# Patient Record
Sex: Female | Born: 1945
Health system: Southern US, Community
[De-identification: ages and names within clinical notes are randomized; demographics above are authoritative.]

## PROBLEM LIST (undated history)

## (undated) DIAGNOSIS — G629 Polyneuropathy, unspecified: Secondary | ICD-10-CM

## (undated) DIAGNOSIS — K219 Gastro-esophageal reflux disease without esophagitis: Secondary | ICD-10-CM

## (undated) DIAGNOSIS — R600 Localized edema: Secondary | ICD-10-CM

## (undated) DIAGNOSIS — F419 Anxiety disorder, unspecified: Secondary | ICD-10-CM

## (undated) DIAGNOSIS — F329 Major depressive disorder, single episode, unspecified: Secondary | ICD-10-CM

## (undated) DIAGNOSIS — E785 Hyperlipidemia, unspecified: Secondary | ICD-10-CM

## (undated) DIAGNOSIS — F1010 Alcohol abuse, uncomplicated: Secondary | ICD-10-CM

## (undated) DIAGNOSIS — I10 Essential (primary) hypertension: Secondary | ICD-10-CM

## (undated) DIAGNOSIS — K859 Acute pancreatitis without necrosis or infection, unspecified: Secondary | ICD-10-CM

## (undated) DIAGNOSIS — F17200 Nicotine dependence, unspecified, uncomplicated: Secondary | ICD-10-CM

## (undated) DIAGNOSIS — Z8619 Personal history of other infectious and parasitic diseases: Secondary | ICD-10-CM

## (undated) DIAGNOSIS — F32A Depression, unspecified: Secondary | ICD-10-CM

## (undated) HISTORY — DX: Localized edema: R60.0

## (undated) HISTORY — DX: Depression, unspecified: F32.A

## (undated) HISTORY — DX: Personal history of other infectious and parasitic diseases: Z86.19

## (undated) HISTORY — DX: Major depressive disorder, single episode, unspecified: F32.9

## (undated) HISTORY — DX: Anxiety disorder, unspecified: F41.9

## (undated) HISTORY — DX: Polyneuropathy, unspecified: G62.9

## (undated) HISTORY — DX: Hyperlipidemia, unspecified: E78.5

## (undated) HISTORY — DX: Essential (primary) hypertension: I10

---

## 1998-08-14 ENCOUNTER — Ambulatory Visit: Admission: RE | Admit: 1998-08-14 | Discharge: 1998-08-14 | Payer: Self-pay | Admitting: Obstetrics and Gynecology

## 2000-08-16 ENCOUNTER — Emergency Department (HOSPITAL_COMMUNITY): Admission: EM | Admit: 2000-08-16 | Discharge: 2000-08-16 | Payer: Self-pay | Admitting: Emergency Medicine

## 2001-07-26 ENCOUNTER — Encounter: Admission: RE | Admit: 2001-07-26 | Discharge: 2001-07-26 | Payer: Self-pay | Admitting: Obstetrics and Gynecology

## 2001-07-26 ENCOUNTER — Encounter: Payer: Self-pay | Admitting: Obstetrics and Gynecology

## 2002-04-06 ENCOUNTER — Ambulatory Visit (HOSPITAL_COMMUNITY): Admission: RE | Admit: 2002-04-06 | Discharge: 2002-04-06 | Payer: Self-pay | Admitting: *Deleted

## 2002-08-03 ENCOUNTER — Encounter (INDEPENDENT_AMBULATORY_CARE_PROVIDER_SITE_OTHER): Payer: Self-pay | Admitting: Specialist

## 2002-08-03 ENCOUNTER — Ambulatory Visit (HOSPITAL_BASED_OUTPATIENT_CLINIC_OR_DEPARTMENT_OTHER): Admission: RE | Admit: 2002-08-03 | Discharge: 2002-08-03 | Payer: Self-pay | Admitting: Obstetrics and Gynecology

## 2002-12-14 HISTORY — PX: OTHER SURGICAL HISTORY: SHX169

## 2003-01-19 ENCOUNTER — Encounter: Payer: Self-pay | Admitting: Obstetrics and Gynecology

## 2003-01-19 ENCOUNTER — Encounter: Admission: RE | Admit: 2003-01-19 | Discharge: 2003-01-19 | Payer: Self-pay | Admitting: Obstetrics and Gynecology

## 2004-02-25 ENCOUNTER — Encounter: Admission: RE | Admit: 2004-02-25 | Discharge: 2004-02-25 | Payer: Self-pay | Admitting: Obstetrics and Gynecology

## 2005-04-06 ENCOUNTER — Encounter: Admission: RE | Admit: 2005-04-06 | Discharge: 2005-04-06 | Payer: Self-pay | Admitting: Obstetrics and Gynecology

## 2006-06-14 ENCOUNTER — Encounter: Admission: RE | Admit: 2006-06-14 | Discharge: 2006-06-14 | Payer: Self-pay | Admitting: Obstetrics and Gynecology

## 2007-07-05 ENCOUNTER — Encounter: Admission: RE | Admit: 2007-07-05 | Discharge: 2007-07-05 | Payer: Self-pay | Admitting: Obstetrics and Gynecology

## 2008-08-03 ENCOUNTER — Encounter: Admission: RE | Admit: 2008-08-03 | Discharge: 2008-08-03 | Payer: Self-pay | Admitting: Obstetrics and Gynecology

## 2009-02-26 ENCOUNTER — Encounter: Admission: RE | Admit: 2009-02-26 | Discharge: 2009-02-26 | Payer: Self-pay | Admitting: Family Medicine

## 2009-11-25 ENCOUNTER — Encounter: Admission: RE | Admit: 2009-11-25 | Discharge: 2009-11-25 | Payer: Self-pay | Admitting: Obstetrics and Gynecology

## 2011-05-01 NOTE — Op Note (Signed)
[facility]  Patient:    April [name], April [name]                      MRN: [id number] Proc. Date: 08/17/00 Adm. Date:  [id number] Attending:  Donnetta Hutching                           Operative Report  PREOPERATIVE DIAGNOSES: 1. 1.7 cm complex left upper lip laceration secondary to dog bite. 2. 1.6 cm intermediate right upper lip laceration secondary to dog bite. 3. 0.5 cm simple central philtral column laceration secondary to dog bite.  POSTOPERATIVE DIAGNOSES: 1. 1.7 cm complex left upper lip laceration secondary to dog bite. 2. 1.6 cm intermediate right upper lip laceration secondary to dog bite. 3. 0.5 cm simple central philtral column laceration secondary to dog bite.  PROCEDURE: 1. Debridement and repair of 1.7 cm complex left upper lip laceration. 2. Debridement and repair of 1.6 cm intermediate right upper lip laceration. 3. Debridement and repair of 0.5 cm simple central philtral column laceration.   SURGEON:  Mary A. Contogiannis, M.D.  ANESTHESIA:  1% lidocaine with epinephrine.  COMPLICATIONS:  None.  INDICATIONS FOR PROCEDURE:  The patient is a 65 year old Caucasian female who presented to the emergency room after being bit by her pet border collie dog. She noted that she was petting the dog and combing his hair when he accidentally bit her. She has 3 lacerations to the upper lip area which all need to be debrided and repaired. The patient requested that I proceed with that surgery at that time. The initial consultation came later on the evening of August 16, 2000 and it is after midnight and early in the morning of August 17, 2000 when the surgery is completed.  DESCRIPTION OF PROCEDURE:  The patient was lying supine on the stretcher in the emergency room. The patients face was prepped with Betadine and draped in a sterile fashion. The skin and subcutaneous tissues around the incisions were injected with 1% lidocaine with epinephrine.  After adequate hemostasis and anesthesia had taken effect, the procedure was begun. First all the areas of the dog bite were sharply debrided. There were some abrasions and contusion present around them as well as bruising. After the wounds were all debrided sharply, they were irrigated with saline irrigation. First the left upper lift laceration was repaired. This laceration measured 1.7 cm and did cross the vermilion border. It was also in the orbicularis oris musculature. The orbicularis oris musculature was reapproximated using 4-0 monocryl interrupted simple sutures. Next, the subdermal, submucosal sutures were placed using 5-0 monocryl interrupted simple sutures. The skin was then closed using 6-0 Prolene in a running baseball type stitch. This left upper lift laceration had cross the vermilion border. In the course of the repair of the laceration, the vermilion border had first been approximated and then the rest of the laceration. Attention was then turned to the right upper lip laceration. This laceration measured 1.6 cm but did not cross the vermilion border. The deeper subcutaneous tissues were closed with a stitch of 4-0 monocryl interrupted suture. Next, the subdermal layer was closed using 5-0 monocryl interrupted sutures. The skin edges were closed using 6-0 Prolene in a running baseball type stitch. Next, the 0.5 cm central philtral column laceration was repaired. The skin edges were closed using 6-0 Prolene in a running baseball stitch. There were no complications. The patient tolerated the procedure well.  All of the incisions were dressed with bacitracin ointment afterwards. She was given a first dose of the clindamycin and the Cipro to take prior to leaving the ER. As per current recommendations that she is PENICILLIN ALLERGIC, the current recommendations call for clindamycin and Cipro, so these were prescribed for her. The patient was then discharged home with the  following instructions in the care of her husband:  DISCHARGE INSTRUCTIONS: 1. Clean the incision with 1/2 strength hydrogen peroxide as needed t.i.d.    to remove any scabbing or bleeding that occurs. 2. Use Bacitracin ointment on all the incisions 3 times a day. 3. Take clindamycin 300 mg p.o. q.i.d. and Cipro 500 mg p.o. b.i.d. for the    next 7 days. 4. Call my office at [phone] to schedule a follow-up appointment for Friday    as well as if any signs of infection develop or you have any questions. DD: 08/17/00 TD:  08/17/00 Job: [id number]

## 2011-05-01 NOTE — Op Note (Signed)
   April [name], April [name]                       ACCOUNT NO.:  [id number]   MEDICAL RECORD NO.:  [id number]                   PATIENT TYPE:  AMB   LOCATION:  [facility]                                 FACILITY:  [facility]   PHYSICIAN:  Katherine Roan, M.D.               DATE OF BIRTH:  29-May-1946   DATE OF PROCEDURE:  08/03/2002  DATE OF DISCHARGE:                                 OPERATIVE REPORT   PREOPERATIVE DIAGNOSIS:  Menorrhagia.   POSTOPERATIVE DIAGNOSIS:  Menorrhagia.   OPERATION PERFORMED:  Hysteroscopy with resection of endometrial cavity and  small uterine fibroid.   DESCRIPTION OF PROCEDURE:  The patient was placed in lithotomy position,  prepped and draped in the usual fashion. Examination revealed a second  degree cystocele, uterine descensus. Her uterus was slightly enlarged. The  patient was then prepped and draped in the usual fashion, the bladder  emptied. The cervix grasped with a tenaculum and cervix carefully dilated to  a #33 [place]. The hysteroscope was inserted into the uterus and the uterus  was resected with the resectoscope. No unusual blood loss occurred. All the  resected material was sent to the lab for study. She was awakened and  carried to the recovery room in good condition.                                               Katherine Roan, M.D.    SDM/MEDQ  D:  08/03/2002  T:  08/04/2002  Job:  [id number]

## 2011-05-01 NOTE — Consult Note (Signed)
[facility]  Patient:    April [name], April [name]                      MRN: [id number] Proc. Date: 08/16/00 Adm. Date:  [id number] Attending:  Donnetta Hutching                          Consultation Report  HISTORY OF PRESENT ILLNESS:  The patient is a 65 year old Caucasian female who presents to the ER for evaluation of a dog bite to the upper lip.  The patient notes that she has a border collie and that she was grooming the dog when the dog accidentally bit her tonight.  It bit her in the upper lip area; as a result, she has three lacerations there.  At this point, Dr. Donnetta Hutching of the ER staff at [facility] has consulted me for evaluation and repair of the injuries.  The patients tetanus status is not up to date.  PAST MEDICAL HISTORY:  Denies cardiac, lung, liver or kidney disease.  PAST SURGICAL HISTORY:  None.  CURRENT MEDICATIONS:  Hormone-replacement therapy.  ALLERGIES:  PENICILLIN causes hives and a rash.  SOCIAL HISTORY:  The patient works at [employer].  She is married and lives in [place].  PHYSICAL EXAMINATION:  GENERAL:  WD, WN 65 year old Caucasian female in NAD.  There is a slight odor of alcohol to the breath.  HEENT:  [place].  PERRL.  EOMI.  Oropharynx without erythema.  A 1.7-cm complex left upper lip laceration that crosses a vermilion border and into the orbicularis oris musculature.  A 1.6-cm right upper lip laceration that is intermediate and does not cross the vermilion border.  A 0.5-cm central philtral column laceration that is simple.  There is abrasion and contusion as well as bruising present around these lacerations.  IMPRESSION 1. A 1.7-cm complex left upper lip laceration. 2. A 1.6-cm intermediate right upper lip laceration. 3. A 0.5 cm simple philtral column laceration.  All of these lacerations are secondary to a dog bite injury.  At this point, they all need to be debrided, irrigated and repaired.  At the  patients request, the procedure is performed under local anesthesia in the ER without complications.  DISCHARGE INSTRUCTIONS:  She was then discharged home with the following instructions: 1. Clean the incisions with half-strength hydrogen peroxide as needed three    times a day if a scab or crust forms over them. 2. Use Bacitracin ointment on all the incisions three times a day. 3. Take clindamycin 300 mg p.o. q.i.d. and Cipro 500 mg p.o. b.i.d. for the    next seven days since you are penicillin allergic. 4. Call my office at [phone] for a followup appointment to be scheduled on    Friday, as well as if any signs of infection develop or you have any    concerns. DD:  08/17/00 TD:  08/17/00 Job: [id number]

## 2012-02-15 ENCOUNTER — Other Ambulatory Visit: Payer: Self-pay | Admitting: Obstetrics and Gynecology

## 2012-02-15 DIAGNOSIS — Z1231 Encounter for screening mammogram for malignant neoplasm of breast: Secondary | ICD-10-CM

## 2012-03-03 ENCOUNTER — Ambulatory Visit: Payer: Self-pay

## 2015-03-06 ENCOUNTER — Other Ambulatory Visit: Payer: Self-pay | Admitting: Family Medicine

## 2015-03-06 DIAGNOSIS — R600 Localized edema: Secondary | ICD-10-CM

## 2015-03-13 ENCOUNTER — Other Ambulatory Visit: Payer: Self-pay

## 2015-04-05 ENCOUNTER — Ambulatory Visit
Admission: RE | Admit: 2015-04-05 | Discharge: 2015-04-05 | Disposition: A | Discharge status: Home / Self Care | Payer: PPO | Source: Ambulatory Visit | Attending: Family Medicine | Admitting: Family Medicine

## 2015-04-05 DIAGNOSIS — R600 Localized edema: Secondary | ICD-10-CM

## 2015-08-16 ENCOUNTER — Encounter: Payer: Self-pay | Admitting: Vascular Surgery

## 2015-08-16 ENCOUNTER — Other Ambulatory Visit: Payer: Self-pay

## 2015-08-16 DIAGNOSIS — R0989 Other specified symptoms and signs involving the circulatory and respiratory systems: Secondary | ICD-10-CM

## 2015-08-16 DIAGNOSIS — R202 Paresthesia of skin: Principal | ICD-10-CM

## 2015-08-16 DIAGNOSIS — R200 Anesthesia of skin: Secondary | ICD-10-CM

## 2015-09-11 ENCOUNTER — Encounter: Payer: Self-pay | Admitting: Vascular Surgery

## 2015-09-13 ENCOUNTER — Encounter: Payer: Self-pay | Admitting: Vascular Surgery

## 2015-09-13 ENCOUNTER — Ambulatory Visit (INDEPENDENT_AMBULATORY_CARE_PROVIDER_SITE_OTHER): Payer: PPO | Admitting: Vascular Surgery

## 2015-09-13 ENCOUNTER — Ambulatory Visit (HOSPITAL_COMMUNITY)
Admission: RE | Admit: 2015-09-13 | Discharge: 2015-09-13 | Disposition: A | Discharge status: Home / Self Care | Payer: PPO | Source: Ambulatory Visit | Attending: Vascular Surgery | Admitting: Vascular Surgery

## 2015-09-13 VITALS — BP 87/58 | HR 91 | Temp 98.2°F | Resp 18 | Ht 66.0 in | Wt 122.1 lb

## 2015-09-13 DIAGNOSIS — R0989 Other specified symptoms and signs involving the circulatory and respiratory systems: Secondary | ICD-10-CM | POA: Insufficient documentation

## 2015-09-13 DIAGNOSIS — R200 Anesthesia of skin: Secondary | ICD-10-CM

## 2015-09-13 DIAGNOSIS — I10 Essential (primary) hypertension: Secondary | ICD-10-CM | POA: Diagnosis not present

## 2015-09-13 DIAGNOSIS — I872 Venous insufficiency (chronic) (peripheral): Secondary | ICD-10-CM | POA: Diagnosis not present

## 2015-09-13 DIAGNOSIS — E785 Hyperlipidemia, unspecified: Secondary | ICD-10-CM | POA: Insufficient documentation

## 2015-09-13 DIAGNOSIS — R202 Paresthesia of skin: Secondary | ICD-10-CM | POA: Insufficient documentation

## 2015-09-13 NOTE — Progress Notes (Signed)
Referred by:  Lahoma Rocker  Reason for referral: Bilateral ankle numbness   History of Present Illness  April [name] is a 69 y.o. (August 21, 1946) female who presents with chief complaint: bilateral ankle numbness.  Patient unable to determine exactly when it began but she think it may have started when she had an episode of shingles in the right leg during this year.   Patient notes, onset of swelling after she started taking medications for the right leg shingles.  The patient's symptoms include: intermittent paraesthesia and anesthesia in both heels.  No obvious trigger or allievators.  .  The patient has had no history of DVT, no history of pregnancy, known history of varicose vein, no history of venous stasis ulcers, no history of  Lymphedema and known history of skin changes in lower legs.  There is no family history of venous disorders.  The patient has not routinely used compression stockings in the past.   Past Medical History  Diagnosis Date  . Anxiety   . Hypertension   . Leg edema   . Depression   . Hyperlipidemia   . History of shingles summer 2015    Past Surgical History  Procedure Laterality Date  . Removal of uterine fibroids  2004    Social History   Social History  . Marital Status: Married    Spouse Name: N/A  . Number of Children: N/A  . Years of Education: N/A   Occupational History  . Not on file.   Social History Main Topics  . Smoking status: Current Every Day Smoker -- 1.00 packs/day    Types: Cigarettes  . Smokeless tobacco: Not on file  . Alcohol Use: Yes     Comment: 2 glasses of brandy per night  . Drug Use: No  . Sexual Activity: Not on file   Other Topics Concern  . Not on file   Social History Narrative    Family History  Problem Relation Age of Onset  . Heart disease Father     Current Outpatient Prescriptions  Medication Sig Dispense Refill  . amitriptyline (ELAVIL) 25 MG tablet Take 25 mg by mouth at bedtime  as needed.     . butalbital-acetaminophen-caffeine (FIORICET WITH CODEINE) 50-325-40-30 MG per capsule Take 1 capsule by mouth every 4 (four) hours as needed for headache.    . clonazePAM (KLONOPIN) 0.5 MG tablet Take 0.5 mg by mouth 2 (two) times daily as needed for anxiety.    Marland Kitchen buPROPion (WELLBUTRIN XL) 150 MG 24 hr tablet     . butalbital-aspirin-caffeine (FIORINAL) 50-325-40 MG capsule     . escitalopram (LEXAPRO) 10 MG tablet     . escitalopram (LEXAPRO) 20 MG tablet Take 20 mg by mouth daily.    . naltrexone (DEPADE) 50 MG tablet     . Vitamin D, Ergocalciferol, (DRISDOL) 50000 UNITS CAPS capsule      No current facility-administered medications for this visit.    Allergies  Allergen Reactions  . Penicillins      REVIEW OF SYSTEMS:  (Positives checked otherwise negative)  CARDIOVASCULAR:    chest pain,   chest pressure,   palpitations,   shortness of breath when laying flat,   shortness of breath with exertion,    pain in feet when walking,   pain in feet when laying flat,  history of blood clot in veins (DVT),   history of phlebitis,   swelling in legs,    varicose veins  PULMONARY:    productive cough,   asthma,   wheezing  NEUROLOGIC:    weakness in arms or legs,   numbness in arms or legs,   difficulty speaking or slurred speech,   temporary loss of vision in one eye,   dizziness  HEMATOLOGIC:    bleeding problems,   problems with blood clotting too easily  MUSCULOSKEL:    joint pain,  joint swelling  GASTROINTEST:    vomiting blood,   blood in stool     GENITOURINARY:    burning with urination,   blood in urine  PSYCHIATRIC:    history of major depression  INTEGUMENTARY:    rashes,   ulcers  CONSTITUTIONAL:    fever,   chills   Physical Examination  Filed Vitals:   09/13/15 1041  BP: 87/58  Pulse: 91  Temp: 98.2 F (36.8 C)    TempSrc: Oral  Resp: 18  Height:  (1.676 m)  Weight: 122 lb 1.6 oz (55.384 kg)  SpO2: 100%   Body mass index is 19.72 kg/(m^2).  General: A&O x 3, WD, thin  Head: [place]/AT  Ear/Nose/Throat: Hearing grossly intact, nares without erythema or drainage, oropharynx without Erythema/Exudate, Mallampati score: 3  Eyes: PERRLA, EOMI  Neck: Supple, no nuchal rigidity, no palpable LAD  Pulmonary: Sym exp, good air movt, CTAB, no rales, rhonchi, & wheezing  Cardiac: RRR, Nl S1, S2, no Murmurs, rubs or gallops  Vascular: Vessel Right Left  Radial Palpable Palpable  Brachial Palpable Palpable  Carotid Palpable, without bruit Palpable, without bruit  Aorta Not palpable N/A  Femoral Palpable Palpable  Popliteal Not palpable Not palpable  PT Palpable Palpable  DP Palpable Palpable   Gastrointestinal: soft, NTND, no G/R, no HSM, no masses, no CVAT B  Musculoskeletal: M/S 5/5 throughout , Extremities without ischemic changes , mild LDS bilateral, no edema, cyanotic feet which resolve with position change  Neurologic: CN 2-12 intact , Pain and light touch intact in extremities , Motor exam as listed above  Psychiatric: Judgment intact, Mood & affect appropriate for pt's clinical situation   Dermatologic: See M/S exam for extremity exam, no rashes otherwise noted  Lymph : No Cervical, Axillary, or Inguinal lymphadenopathy    Non-Invasive Vascular Imaging  ABI (Date: 09/13/2015)  R:   ABI: 1.22,  DP: tri,   PT: tri,   TBI: 0.56  L:   ABI: 1.18,   DP: tri,   PT: tri,   TBI: 0.74   Outside Studies/Documentation 4 pages of outside documents were reviewed including: outpatient PCP chart.   Medical Decision Making  April [name] is a 69 y.o. female who presents with: BLE chronic venous insufficiency (C2), bilateral heel neuropathic sx, no PAD   Based on the patient's history and examination, I recommend: OTC compressive therapy as needed for any lower  extremity swelling.  She has no evidence of PAD in either leg, so that does not account for her heel sx.  The cyanotic hue in her feet is due to CVI not PAD.  Thank you for allowing [place] to participate in this patient's care.   Leonides Sake, MD [facility] Office: [phone] Pager: [phone]  09/13/2015, 11:10 AM

## 2015-12-05 ENCOUNTER — Ambulatory Visit (INDEPENDENT_AMBULATORY_CARE_PROVIDER_SITE_OTHER): Payer: PPO | Admitting: Neurology

## 2015-12-05 ENCOUNTER — Encounter: Payer: Self-pay | Admitting: Neurology

## 2015-12-05 VITALS — BP 131/68 | HR 87 | Ht 66.0 in | Wt 126.8 lb

## 2015-12-05 DIAGNOSIS — F1010 Alcohol abuse, uncomplicated: Secondary | ICD-10-CM | POA: Diagnosis not present

## 2015-12-05 DIAGNOSIS — G629 Polyneuropathy, unspecified: Secondary | ICD-10-CM

## 2015-12-05 DIAGNOSIS — Z720 Tobacco use: Secondary | ICD-10-CM

## 2015-12-05 DIAGNOSIS — R2689 Other abnormalities of gait and mobility: Secondary | ICD-10-CM

## 2015-12-05 DIAGNOSIS — R269 Unspecified abnormalities of gait and mobility: Secondary | ICD-10-CM | POA: Diagnosis not present

## 2015-12-05 MED ORDER — PREGABALIN 75 MG PO CAPS: 75.0000 mg | ORAL_CAPSULE | Freq: Two times a day (BID) | ORAL | Status: DC

## 2015-12-05 NOTE — Progress Notes (Signed)
[facility]    Provider:  Dr Lucia Gaskins Referring Provider: Richmond Campbell., PA-C Primary Care Physician:  Lilia Argue  CC:  Peripheral neuropathy  HPI:  April [name] is a 69 y.o. female who looks older than stated age here as a referral from Dr. Arlyce [name] for neuropathy. PMHx of chronic venous insufficiency, depression, anxiety, tobacco and alcohol abuse. Numbness in the feet started a year ago. She had shingles and her legs started swelling with the neurontin. She has had swelling in the legs since then. The feel feet tight. The swelling has improved. She has tightness from the ankles to her knees. She has numbness, tingling in the feet. She has problems with balance due to the sensation in the feet. The pain can be 7/10 in pain. Continuous pain. She is not diabetic. She tried Lyrica  twice daily for a week then stopped. Progressively worsening. Denies diabetes. She drinks brandy every day Brandy, at least 2 bottles a week, She smokes a pack of cigarrettes a day. She only treid Lyrica for a week. She denies cramping in her feet. She has weakness in the legs.  She is here with her daughter who also provides information.   Reviewed notes, labs and imaging from outside physicians, which showed:  Notes from [facility]: April [name] is a 69 y.o. (1946-10-18) female who presents with chief complaint: bilateral ankle numbness. Patient unable to determine exactly when it began but she think it may have started when she had an episode of shingles in the right leg during this year. Patient notes, onset of swelling after she started taking medications for the right leg shingles. The patient's symptoms include: intermittent paraesthesia and anesthesia in both heels. No obvious trigger or allievators. . The patient has had no history of DVT, no history of pregnancy, known history of varicose vein, no history of venous stasis ulcers, no history of  Lymphedema and known history of skin changes in lower legs. There is no family history of venous disorders. The patient has not routinely used compression stockings in the past.   RIGHT LOWER EXTREMITY  Common Femoral Vein: No evidence of thrombus. Normal compressibility, respiratory phasicity and response to augmentation.  Saphenofemoral Junction: No evidence of thrombus. Normal compressibility and flow on color Doppler imaging.  Profunda Femoral Vein: No evidence of thrombus. Normal compressibility and flow on color Doppler imaging.  Femoral Vein: No evidence of thrombus. Normal compressibility, respiratory phasicity and response to augmentation.  Popliteal Vein: No evidence of thrombus. Normal compressibility, respiratory phasicity and response to augmentation.  Calf Veins: No evidence of thrombus. Normal compressibility and flow on color Doppler imaging.  Superficial Great Saphenous Vein: No evidence of thrombus. Normal compressibility and flow on color Doppler imaging.  Venous Reflux: None.  Other Findings: None.  LEFT LOWER EXTREMITY  Common Femoral Vein: No evidence of thrombus. Normal compressibility, respiratory phasicity and response to augmentation.  Saphenofemoral Junction: No evidence of thrombus. Normal compressibility and flow on color Doppler imaging.  Profunda Femoral Vein: No evidence of thrombus. Normal compressibility and flow on color Doppler imaging.  Femoral Vein: No evidence of thrombus. Normal compressibility,respiratory phasicity and response to augmentation.  Popliteal Vein: No evidence of thrombus. Normal compressibility,respiratory phasicity and response to augmentation.  Calf Veins: No evidence of thrombus. Normal compressibility and flow on color Doppler imaging.  Superficial Great Saphenous Vein: No evidence of thrombus. Normal compressibility and flow on color Doppler imaging.  Venous Reflux: None.  Other Findings:  None.  IMPRESSION: No evidence of deep venous thrombosis  Review of Systems: Patient complains of symptoms per HPI as well as the following symptoms: swelling in the legs, aching muscles, numbness in feet. Pertinent negatives per HPI. All others negative.   Social History   Social History  . Marital Status: Widowed    Spouse Name: N/A  . Number of Children: 1  . Years of Education: 16   Occupational History  . Not on file.   Social History Main Topics  . Smoking status: Current Every Day Smoker -- 1.00 packs/day    Types: Cigarettes  . Smokeless tobacco: Not on file  . Alcohol Use: Yes     Comment: 2 glasses of brandy per night  . Drug Use: No  . Sexual Activity: Not on file   Other Topics Concern  . Not on file   Social History Narrative   Lives at home with daughter [name]   Caffeine use: Drinks coffee 1/day    Family History  Problem Relation Age of Onset  . Heart disease Father   . Neuropathy Neg Hx     Past Medical History  Diagnosis Date  . Anxiety   . Hypertension   . Leg edema   . Depression   . Hyperlipidemia   . History of shingles summer 2015    Past Surgical History  Procedure Laterality Date  . Removal of uterine fibroids  2004    Current Outpatient Prescriptions  Medication Sig Dispense Refill  . clonazePAM (KLONOPIN) 0.5 MG tablet Take 0.5 mg by mouth 2 (two) times daily as needed for anxiety.    Marland Kitchen. escitalopram (LEXAPRO) 20 MG tablet Take 20 mg by mouth daily.    . naltrexone (DEPADE) 50 MG tablet     . pregabalin (LYRICA) 75 MG capsule Take 1 capsule (75 mg total) by mouth 2 (two) times daily. 60 capsule 6   No current facility-administered medications for this visit.    Allergies as of 12/05/2015 - Review Complete 12/05/2015  Allergen Reaction Noted  . Penicillins  08/16/2015    Vitals: BP 131/68 mmHg  Pulse 87  Ht 5\' 6"  (1.676 m)  Wt 126 lb 12.8 oz (57.516 kg)  BMI 20.48 kg/m2 Last Weight:  Wt Readings from Last 1  Encounters:  12/05/15 126 lb 12.8 oz (57.516 kg)   Last Height:   Ht Readings from Last 1 Encounters:  12/05/15 5\' 6"  (1.676 m)   Physical exam: Exam: Gen: NAD,  thin                   CV: RRR, no MRG. No Carotid Bruits. No peripheral edema, warm, nontender Eyes: Conjunctivae clear without exudates or hemorrhage  Neuro: Detailed Neurologic Exam  Speech:    Speech is normal; fluent and spontaneous with normal comprehension.  Cognition:    The patient is oriented to person, place, and time;     recent and remote memory intact;     language fluent;     normal attention, concentration,     fund of knowledge Cranial Nerves:    The pupils are equal, round, and reactive to light. The fundi are flat.  Visual fields are full to finger confrontation. Extraocular movements are intact. Trigeminal sensation is intact and the muscles of mastication are normal. The face is symmetric. The palate elevates in the midline. Hearing intact. Voice is normal. Shoulder shrug is normal. The tongue has normal motion without fasciculations.   Coordination:  Normal finger to nose and heel to shin.   Gait:   Not ataxic  Motor Observation:    No asymmetry, no atrophy, and no involuntary movements noted. Tone:    Normal muscle tone.    Posture:    Posture is normal. normal erect    Strength:    Strength is V/V in the upper and lower limbs.      Sensation: decr to pp and temp in glove and stocking distribution.      Reflex Exam:  DTR's: Absent AJs. Otherwise deep tendon reflexes in the upper and lower extremities are brisk bilaterally.   Toes:    The toes are downgoing bilaterally.   Clonus:    Clonus is absent.       Assessment/Plan:   69 y.o. female who looks older than stated age here as a referral from Dr. Arlyce [name] for neuropathy. PMHx of chronic venous insufficiency, depression, anxiety, tobacco and alcohol abuse. Will perform a thorough serum neuropathy panel. Suspect distal symmetric  peripheral polyneuropathy from alcohol abuse. She has dec pp and temp in a glove-and-stocking distribution.  Had a long discussion with daughter and mother, both resistant to the idea that alcohol can cause neuropathy and stating that alcohol helps with mother's depression and other problems. Highly encouraged meeting with primary care to make a plan to decrease alcohol use as well as smoking cessation. Will try Lyrica again, explained may take up to 6 weeks to 3 months to see a difference, not to stop after a week. If lyrica doesn't work, can try Gralise or Cymbalta and then refer to pain clinic. Will screen for any other risk factors such as B12 deficiency.   CC: Dr. Glori Bickers, MD  [facility] [address] [place], [place] [address]  Phone [phone] Fax [phone]

## 2015-12-05 NOTE — Patient Instructions (Addendum)
Remember to drink plenty of fluid, eat healthy meals and do not skip any meals. Try to eat protein with a every meal and eat a healthy snack such as fruit or nuts in between meals. Try to keep a regular sleep-wake schedule and try to exercise daily, particularly in the form of walking, 20-30 minutes a day, if you can.   As far as your medications are concerned, I would like to suggest: Lyrica 75mg  twice daily. In 6 weeks if no response and not having any side effects, we can increase to 150mg  twice daily  As far as diagnostic testing: labs  Our phone number is [phone]. We also have an after hours call service for urgent matters and there is a physician on-call for urgent questions. For any emergencies you know to call 911 or go to the nearest emergency room

## 2015-12-09 LAB — MULTIPLE MYELOMA PANEL, SERUM
ALBUMIN/GLOB SERPL: 1.3 (ref 0.7–1.7)
ALPHA2 GLOB SERPL ELPH-MCNC: 0.7 g/dL (ref 0.4–1.0)
Albumin SerPl Elph-Mcnc: 3.3 g/dL (ref 2.9–4.4)
Alpha 1: 0.3 g/dL (ref 0.0–0.4)
B-GLOBULIN SERPL ELPH-MCNC: 1.1 g/dL (ref 0.7–1.3)
GAMMA GLOB SERPL ELPH-MCNC: 0.7 g/dL (ref 0.4–1.8)
GLOBULIN, TOTAL: 2.7 g/dL (ref 2.2–3.9)
IGG (IMMUNOGLOBIN G), SERUM: 575 mg/dL — AB (ref 700–1600)
IgA/Immunoglobulin A, Serum: 275 mg/dL (ref 87–352)
IgM (Immunoglobulin M), Srm: 127 mg/dL (ref 26–217)

## 2015-12-09 LAB — COMPREHENSIVE METABOLIC PANEL
A/G RATIO: 1.9 (ref 1.1–2.5)
ALT: 19 IU/L (ref 0–32)
AST: 49 IU/L — ABNORMAL HIGH (ref 0–40)
Albumin: 3.9 g/dL (ref 3.6–4.8)
Alkaline Phosphatase: 148 IU/L — ABNORMAL HIGH (ref 39–117)
BUN/Creatinine Ratio: 20 (ref 11–26)
BUN: 9 mg/dL (ref 8–27)
Bilirubin Total: 0.3 mg/dL (ref 0.0–1.2)
CALCIUM: 9.6 mg/dL (ref 8.7–10.3)
CO2: 20 mmol/L (ref 18–29)
CREATININE: 0.44 mg/dL — AB (ref 0.57–1.00)
Chloride: 101 mmol/L (ref 96–106)
GFR, EST AFRICAN AMERICAN: 120 mL/min/{1.73_m2} (ref >59)
GFR, EST NON AFRICAN AMERICAN: 104 mL/min/{1.73_m2} (ref >59)
GLOBULIN, TOTAL: 2.1 g/dL (ref 1.5–4.5)
Glucose: 86 mg/dL (ref 65–99)
POTASSIUM: 3.9 mmol/L (ref 3.5–5.2)
SODIUM: 145 mmol/L — AB (ref 134–144)
TOTAL PROTEIN: 6 g/dL (ref 6.0–8.5)

## 2015-12-09 LAB — METHYLMALONIC ACID, SERUM: Methylmalonic Acid: 109 nmol/L (ref 0–378)

## 2015-12-09 LAB — B12 AND FOLATE PANEL: Vitamin B-12: 339 pg/mL (ref 211–946)

## 2015-12-09 LAB — HEAVY METALS, BLOOD
Arsenic: 7 ug/L (ref 2–23)
LEAD, BLOOD: 9 ug/dL (ref 0–19)
Mercury: NOT DETECTED ug/L (ref 0.0–14.9)

## 2015-12-09 LAB — HEMOGLOBIN A1C
Est. average glucose Bld gHb Est-mCnc: 82 mg/dL
Hgb A1c MFr Bld: 4.5 % — ABNORMAL LOW (ref 4.8–5.6)

## 2015-12-09 LAB — VITAMIN B6: VITAMIN B6: 2.3 ug/L (ref 2.0–32.8)

## 2015-12-09 LAB — ANA W/REFLEX: Anti Nuclear Antibody(ANA): NEGATIVE

## 2015-12-09 LAB — B. BURGDORFI ANTIBODIES

## 2015-12-09 LAB — SEDIMENTATION RATE: SED RATE: 22 mm/h (ref 0–40)

## 2015-12-09 LAB — HIV ANTIBODY (ROUTINE TESTING W REFLEX): HIV SCREEN 4TH GENERATION: NONREACTIVE

## 2015-12-09 LAB — RHEUMATOID FACTOR: Rhuematoid fact SerPl-aCnc: 12.9 IU/mL (ref 0.0–13.9)

## 2015-12-09 LAB — HEPATITIS C ANTIBODY: Hep C Virus Ab: <0.1 s/co ratio (ref 0.0–0.9)

## 2015-12-09 LAB — VITAMIN B1: Thiamine: 128.8 nmol/L (ref 66.5–200.0)

## 2015-12-09 LAB — RPR: RPR Ser Ql: NONREACTIVE

## 2015-12-09 LAB — TSH: TSH: 2.35 u[IU]/mL (ref 0.450–4.500)

## 2015-12-11 ENCOUNTER — Encounter: Payer: Self-pay | Admitting: Neurology

## 2015-12-11 DIAGNOSIS — G629 Polyneuropathy, unspecified: Secondary | ICD-10-CM | POA: Insufficient documentation

## 2015-12-11 DIAGNOSIS — F1010 Alcohol abuse, uncomplicated: Secondary | ICD-10-CM | POA: Insufficient documentation

## 2015-12-11 DIAGNOSIS — Z720 Tobacco use: Secondary | ICD-10-CM | POA: Insufficient documentation

## 2015-12-12 ENCOUNTER — Telehealth: Payer: Self-pay | Admitting: *Deleted

## 2015-12-12 NOTE — Telephone Encounter (Signed)
-----   Message from Anson FretAntonia B Ahern, MD sent at 12/08/2015  8:52 AM EST ----- All labs came back normal except her folate. Her folate is very low. She should take a daily vitamin with 1mg  of folate in it. Studies show that a diet high in folate-rich foods can help prevent cancer, heart disease, birth defects, anemia and cognitive decline.

## 2015-12-12 NOTE — Telephone Encounter (Signed)
Spoke to pt about lab results per Dr Lucia GaskinsAhern note. Advised she needs to get daily vitamin w/ 1mg  folate in it. Pt verbalized understanding.

## 2015-12-19 ENCOUNTER — Telehealth: Payer: Self-pay | Admitting: Neurology

## 2015-12-19 DIAGNOSIS — M792 Neuralgia and neuritis, unspecified: Secondary | ICD-10-CM

## 2015-12-19 MED ORDER — PREGABALIN 150 MG PO CAPS: 150.0000 mg | ORAL_CAPSULE | Freq: Two times a day (BID) | ORAL | Status: DC

## 2015-12-19 NOTE — Telephone Encounter (Signed)
Called pt back. Advised lyrica can cause swelling but she states she had this sx prior to starting medication and is not having any SE from lyrica. She has been on medication for 2 weeks. Per Dr Lucia GaskinsAhern note, she would like her to try for 6 weeks and could increase after if not helping to 150mg . She states she is in too much pain and cannot walk well. She is losing her balance. Advised Dr Lucia GaskinsAhern seeing pt right now and will discuss with her and call her back. She verbalized understanding.

## 2015-12-19 NOTE — Telephone Encounter (Signed)
Called pt and relayed per Dr Lucia GaskinsAhern that she is ok to increase lyrica to 150mg  2 times daily. Will send rx to pharmacy. Verified pharmacy. Faxed rx. Received confirmation.

## 2015-12-19 NOTE — Telephone Encounter (Signed)
I am ok with increasing the Lyrica. thnaks

## 2015-12-19 NOTE — Telephone Encounter (Signed)
Patient is calling. She wants to know if there is something she can do for her legs swelling. Her legs are also very painful. Please call and discuss. Thank you.

## 2016-01-01 ENCOUNTER — Telehealth: Payer: Self-pay | Admitting: Neurology

## 2016-01-01 DIAGNOSIS — R2681 Unsteadiness on feet: Secondary | ICD-10-CM | POA: Diagnosis not present

## 2016-01-01 DIAGNOSIS — G629 Polyneuropathy, unspecified: Secondary | ICD-10-CM | POA: Diagnosis not present

## 2016-01-01 DIAGNOSIS — J309 Allergic rhinitis, unspecified: Secondary | ICD-10-CM | POA: Diagnosis not present

## 2016-01-01 NOTE — Telephone Encounter (Signed)
Rn call patient about her receiving physical therapy, and wanted to know Dr.Ahern advice. Rn stated that physical therapy is good for neuropathy.. Rn stated that Dr.Ahern would not mind her having therapy. Message was sent to Dr.Ahern, and she agreed with the therapy.

## 2016-01-01 NOTE — Telephone Encounter (Signed)
Patient called to advise she saw Dr. Karen Chafe at [facility] in [place] and he's recommending Physical Therapy for neuropathy and wants Dr. Lucia Gaskins to know and see if She thinks that's a good idea.

## 2016-01-07 DIAGNOSIS — I8311 Varicose veins of right lower extremity with inflammation: Secondary | ICD-10-CM | POA: Diagnosis not present

## 2016-01-07 DIAGNOSIS — L03115 Cellulitis of right lower limb: Secondary | ICD-10-CM | POA: Diagnosis not present

## 2016-01-07 DIAGNOSIS — I8312 Varicose veins of left lower extremity with inflammation: Secondary | ICD-10-CM | POA: Diagnosis not present

## 2016-01-07 DIAGNOSIS — L03116 Cellulitis of left lower limb: Secondary | ICD-10-CM | POA: Diagnosis not present

## 2016-01-15 DIAGNOSIS — I739 Peripheral vascular disease, unspecified: Secondary | ICD-10-CM | POA: Diagnosis not present

## 2016-01-15 DIAGNOSIS — L03115 Cellulitis of right lower limb: Secondary | ICD-10-CM | POA: Diagnosis not present

## 2016-01-15 DIAGNOSIS — L03116 Cellulitis of left lower limb: Secondary | ICD-10-CM | POA: Diagnosis not present

## 2016-02-24 ENCOUNTER — Encounter (HOSPITAL_COMMUNITY): Payer: Self-pay | Admitting: Vascular Surgery

## 2016-02-24 ENCOUNTER — Emergency Department (HOSPITAL_COMMUNITY): Payer: PPO

## 2016-02-24 ENCOUNTER — Emergency Department (HOSPITAL_COMMUNITY)
Admission: EM | Admit: 2016-02-24 | Discharge: 2016-02-24 | Disposition: A | Discharge status: Home / Self Care | Payer: PPO | Attending: Emergency Medicine | Admitting: Emergency Medicine

## 2016-02-24 DIAGNOSIS — Y9289 Other specified places as the place of occurrence of the external cause: Secondary | ICD-10-CM | POA: Insufficient documentation

## 2016-02-24 DIAGNOSIS — S0181XA Laceration without foreign body of other part of head, initial encounter: Secondary | ICD-10-CM | POA: Insufficient documentation

## 2016-02-24 DIAGNOSIS — Y9389 Activity, other specified: Secondary | ICD-10-CM | POA: Diagnosis not present

## 2016-02-24 DIAGNOSIS — W01198A Fall on same level from slipping, tripping and stumbling with subsequent striking against other object, initial encounter: Secondary | ICD-10-CM | POA: Insufficient documentation

## 2016-02-24 DIAGNOSIS — S0990XA Unspecified injury of head, initial encounter: Secondary | ICD-10-CM | POA: Diagnosis not present

## 2016-02-24 DIAGNOSIS — I10 Essential (primary) hypertension: Secondary | ICD-10-CM | POA: Diagnosis not present

## 2016-02-24 DIAGNOSIS — F17210 Nicotine dependence, cigarettes, uncomplicated: Secondary | ICD-10-CM | POA: Diagnosis not present

## 2016-02-24 DIAGNOSIS — Y998 Other external cause status: Secondary | ICD-10-CM | POA: Diagnosis not present

## 2016-02-24 DIAGNOSIS — R55 Syncope and collapse: Secondary | ICD-10-CM | POA: Diagnosis not present

## 2016-02-24 NOTE — ED Notes (Signed)
Patient states that she is in too much pain to wait any longer.  Patient urged to stay.  Patient requested results of CT.  Patient was advised to call PCP tomorrow for results.

## 2016-02-24 NOTE — ED Notes (Signed)
Pt reports to the ED for eval of headache and right sided head laceration. She fell yesterday and hit her head on the chair and floor. She also hit her nose on the floor and has been having clear nasal drainage. Pt denies any blood thinner use. Pt reports positive LOC. Pt A&Ox4, resp e/u, and skin warm and dry. Pt denies any neck pain.

## 2016-02-26 DIAGNOSIS — Y92099 Unspecified place in other non-institutional residence as the place of occurrence of the external cause: Secondary | ICD-10-CM | POA: Diagnosis not present

## 2016-02-26 DIAGNOSIS — S0181XA Laceration without foreign body of other part of head, initial encounter: Secondary | ICD-10-CM | POA: Diagnosis not present

## 2016-02-26 DIAGNOSIS — S0083XA Contusion of other part of head, initial encounter: Secondary | ICD-10-CM | POA: Diagnosis not present

## 2016-02-26 DIAGNOSIS — W19XXXA Unspecified fall, initial encounter: Secondary | ICD-10-CM | POA: Diagnosis not present

## 2016-03-04 ENCOUNTER — Ambulatory Visit: Payer: PPO | Admitting: Neurology

## 2016-03-04 DIAGNOSIS — M545 Low back pain: Secondary | ICD-10-CM | POA: Diagnosis not present

## 2016-04-16 ENCOUNTER — Ambulatory Visit: Payer: PPO | Admitting: Neurology

## 2016-04-16 ENCOUNTER — Telehealth: Payer: Self-pay | Admitting: *Deleted

## 2016-04-16 NOTE — Telephone Encounter (Signed)
I really cant start any new medications over the phone. Is she sure I can't set her up with Butch PennyMegan Millikan to be seen earlier? She will love Aundra MilletMegan.

## 2016-04-16 NOTE — Telephone Encounter (Signed)
Dr Lucia GaskinsAhern- Please advise. Annabelle HarmanDana did suggest seeing NP same day but pt changed her mind and wanted to still f/u with you. Please advise. She had to r/s f/u because she showed up late.

## 2016-04-17 NOTE — Telephone Encounter (Signed)
Aundra MilletMegan, she is scheduled with you on the 8th. Let;s discuss, thanks

## 2016-04-17 NOTE — Telephone Encounter (Signed)
Called pt. R/s for earlier f/u. Patient had to r/s appt from 5/4 d/t being late. I went over late policy with pt. She knows to check in 1045am. Advised April MilletMegan is one of our NP and is great. Dr [facility]Ahern [facility] while she is in the office for appt. She verbalized understanding and appreciation.

## 2016-04-20 ENCOUNTER — Encounter: Payer: Self-pay | Admitting: Adult Health

## 2016-04-20 ENCOUNTER — Ambulatory Visit (INDEPENDENT_AMBULATORY_CARE_PROVIDER_SITE_OTHER): Payer: PPO | Admitting: Adult Health

## 2016-04-20 ENCOUNTER — Telehealth: Payer: Self-pay | Admitting: Adult Health

## 2016-04-20 ENCOUNTER — Telehealth: Payer: Self-pay | Admitting: *Deleted

## 2016-04-20 VITALS — BP 112/73 | HR 88 | Ht 67.0 in | Wt 116.2 lb

## 2016-04-20 DIAGNOSIS — G629 Polyneuropathy, unspecified: Secondary | ICD-10-CM | POA: Diagnosis not present

## 2016-04-20 DIAGNOSIS — R269 Unspecified abnormalities of gait and mobility: Secondary | ICD-10-CM

## 2016-04-20 DIAGNOSIS — M792 Neuralgia and neuritis, unspecified: Secondary | ICD-10-CM | POA: Diagnosis not present

## 2016-04-20 MED ORDER — PREGABALIN 150 MG PO CAPS: 150.0000 mg | ORAL_CAPSULE | Freq: Two times a day (BID) | ORAL | Status: DC

## 2016-04-20 NOTE — Addendum Note (Signed)
Addended by: Enedina FinnerMILLIKAN, Jenesis Martin P on: 04/20/2016 05:03 PM   Modules accepted: Orders

## 2016-04-20 NOTE — Progress Notes (Addendum)
PATIENT: April [name] DOB: 1946/07/07  REASON FOR VISIT: follow up- neuropathy HISTORY FROM: patient  HISTORY OF PRESENT ILLNESS: April [name] is a 70 year old female with a history of neuropathy. She returns today for follow-up. She reports that she continues to have discomfort in the lower extremities. However when she describes her discomfort she describes it as numbness. She denies any sharp shooting pains. Denies any burning or tingling. She states that she does have difficulty ambulating due to her feet being numb. She reports that she has been on Lyrica however she was not taking it as prescribed. She states that she was taking at least one dose daily sometimes she would skip that. She has been off of Lyrica for 1 week but have not noticed any significant changes with her discomfort. She has shingles that affected the right leg. She states occasionally she'll get sharp shooting pains around the hip area. However she has primarily noticed this since she has not been taking Lyrica. She does not use an assistive device when ambulating. She reports that she had one fall that she relates to her blood pressure. Her daughter is with her today. She is requesting narcotic pain medication for her mother. She reports that her mom's been on tramadol with no benefit. She also reports that she put a lidocaine patch on her mom's back and that helped with her discomfort.  Initial visit 12/05/2015 [facility]): April [name] is a 70 y.o. female who looks older than stated age here as a referral from Dr. Arlyce Dice for neuropathy. PMHx of chronic venous insufficiency, depression, anxiety, tobacco and alcohol abuse. Numbness in the feet started a year ago. She had shingles and her legs started swelling with the neurontin. She has had swelling in the legs since then. The feel feet tight. The swelling has improved. She has tightness from the ankles to her knees. She has numbness, tingling in the feet. She has problems  with balance due to the sensation in the feet. The pain can be 7/10 in pain. Continuous pain. She is not diabetic. She tried Lyrica 150mg  twice daily for a week then stopped. Progressively worsening. Denies diabetes. She drinks brandy every day Brandy, at least 2 bottles a week, She smokes a pack of cigarrettes a day. She only treid Lyrica for a week. She denies cramping in her feet. She has weakness in the legs. She is here with her daughter who also provides information.   Reviewed notes, labs and imaging from outside physicians, which showed:  Notes from [facility]: April [name] is a 70 y.o. (05/14/1946) female who presents with chief complaint: bilateral ankle numbness. Patient unable to determine exactly when it began but she think it may have started when she had an episode of shingles in the right leg during this year. Patient notes, onset of swelling after she started taking medications for the right leg shingles. The patient's symptoms include: intermittent paraesthesia and anesthesia in both heels. No obvious trigger or allievators. . The patient has had no history of DVT, no history of pregnancy, known history of varicose vein, no history of venous stasis ulcers, no history of Lymphedema and known history of skin changes in lower legs. There is no family history of venous disorders. The patient has not routinely used compression stockings in the past.   RIGHT LOWER EXTREMITY  Common Femoral Vein: No evidence of thrombus. Normal compressibility, respiratory phasicity and response to augmentation.  Saphenofemoral Junction: No evidence of thrombus.  Normal compressibility and flow on color Doppler imaging.  Profunda Femoral Vein: No evidence of thrombus. Normal compressibility and flow on color Doppler imaging.  Femoral Vein: No evidence of thrombus. Normal compressibility, respiratory phasicity and response to augmentation.  Popliteal Vein: No evidence  of thrombus. Normal compressibility, respiratory phasicity and response to augmentation.  Calf Veins: No evidence of thrombus. Normal compressibility and flow on color Doppler imaging.  Superficial Great Saphenous Vein: No evidence of thrombus. Normal compressibility and flow on color Doppler imaging.  Venous Reflux: None.  Other Findings: None.  LEFT LOWER EXTREMITY  Common Femoral Vein: No evidence of thrombus. Normal compressibility, respiratory phasicity and response to augmentation.  Saphenofemoral Junction: No evidence of thrombus. Normal compressibility and flow on color Doppler imaging.  Profunda Femoral Vein: No evidence of thrombus. Normal compressibility and flow on color Doppler imaging.  Femoral Vein: No evidence of thrombus. Normal compressibility,respiratory phasicity and response to augmentation.  Popliteal Vein: No evidence of thrombus. Normal compressibility,respiratory phasicity and response to augmentation.  Calf Veins: No evidence of thrombus. Normal compressibility and flow on color Doppler imaging.  Superficial Great Saphenous Vein: No evidence of thrombus. Normal compressibility and flow on color Doppler imaging.  Venous Reflux: None.  Other Findings: None.  IMPRESSION: No evidence of deep venous thrombosis  REVIEW OF SYSTEMS: Out of a complete 14 system review of symptoms, the patient complains only of the following symptoms, and all other reviewed systems are negative.  Activity change, appetite change, leg swelling, back pain, walking difficulty  ALLERGIES: Allergies  Allergen Reactions  . Naltrexone Shortness Of Breath  . Penicillins Rash    HOME MEDICATIONS: Outpatient Prescriptions Prior to Visit  Medication Sig Dispense Refill  . clonazePAM (KLONOPIN) 0.5 MG tablet Take 0.5 mg by mouth 2 (two) times daily as needed for anxiety.    Marland Kitchen escitalopram (LEXAPRO) 20 MG tablet Take 20 mg by mouth daily.    . pregabalin (LYRICA)  150 MG capsule Take 1 capsule (150 mg total) by mouth 2 (two) times daily. 60 capsule 5  . naltrexone (DEPADE) 50 MG tablet      No facility-administered medications prior to visit.    PAST MEDICAL HISTORY: Past Medical History  Diagnosis Date  . Anxiety   . Hypertension   . Leg edema   . Depression   . Hyperlipidemia   . History of shingles summer 2015    PAST SURGICAL HISTORY: Past Surgical History  Procedure Laterality Date  . Removal of uterine fibroids  2004    FAMILY HISTORY: Family History  Problem Relation Age of Onset  . Heart disease Father   . Neuropathy Neg Hx     SOCIAL HISTORY: Social History   Social History  . Marital Status: Widowed    Spouse Name: N/A  . Number of Children: 1  . Years of Education: 16   Occupational History  . Not on file.   Social History Main Topics  . Smoking status: Current Every Day Smoker -- 1.00 packs/day    Types: Cigarettes  . Smokeless tobacco: Not on file  . Alcohol Use: Yes     Comment: 2 glasses of brandy per night  . Drug Use: No  . Sexual Activity: Not on file   Other Topics Concern  . Not on file   Social History Narrative   Lives at home with daughter Yvonne Kendall   Caffeine use: Drinks coffee 1/day      PHYSICAL EXAM  Filed Vitals:   04/20/16 1105  BP: 112/73  Pulse: 88  Height: 5\' 7"  (1.702 m)  Weight: 116 lb 3.2 oz (52.708 kg)   Body mass index is 18.2 kg/(m^2).  Generalized: Well developed, in no acute distress  Skin: Lower extremities starting mid calf to the foot is red with mild edema.  Neurological examination  Mentation: Alert oriented to time, place, history taking. Follows all commands speech and language fluent Cranial nerve II-XII: Pupils were equal round reactive to light. Extraocular movements were full, visual field were full on confrontational test. Facial sensation and strength were normal. Uvula tongue midline. Head turning and shoulder shrug  were normal and symmetric. Motor:  The motor testing reveals 5 over 5 strength of all 4 extremities. Good symmetric motor tone is noted throughout.  Sensory: Sensory testing is intact to soft touch on all 4 extremities.Pinprick sensation is decreased in the lower extremities in a stocking-like pattern. Vibration sensation decreased in the lower extremities. No evidence of extinction is noted.  Coordination: Cerebellar testing reveals good finger-nose-finger and heel-to-shin bilaterally.  Gait and station: Gait is wide-based. Tandem gait is unsteady. Romberg is negative. No drift is seen.  Reflexes: Deep tendon reflexes are symmetric and normal bilaterally.   DIAGNOSTIC DATA (LABS, IMAGING, TESTING) - I reviewed patient records, labs, notes, testing and imaging myself where available.      Component Value Date/Time   NA 145* 12/05/2015 1059   K 3.9 12/05/2015 1059   CL 101 12/05/2015 1059   CO2 20 12/05/2015 1059   GLUCOSE 86 12/05/2015 1059   BUN 9 12/05/2015 1059   CREATININE 0.44* 12/05/2015 1059   CALCIUM 9.6 12/05/2015 1059   PROT 6.0 12/05/2015 1059   ALBUMIN 3.9 12/05/2015 1059   AST 49* 12/05/2015 1059   ALT 19 12/05/2015 1059   ALKPHOS 148* 12/05/2015 1059   BILITOT 0.3 12/05/2015 1059   GFRNONAA 104 12/05/2015 1059   GFRAA 120 12/05/2015 1059    Lab Results  Component Value Date   HGBA1C 4.5* 12/05/2015   Lab Results  Component Value Date   VITAMINB12 339 12/05/2015   Lab Results  Component Value Date   TSH 2.350 12/05/2015      ASSESSMENT AND PLAN 70 y.o. year old female  has a past medical history of Anxiety; Hypertension; Leg edema; Depression; Hyperlipidemia; and History of shingles (summer 2015). here with:  1. Neuropathy 2. Abnormality of gait  I had a long discussion with the patient and her daughter.Alcohol could be a contributing factor to her neuropathy. I advised that she should slowly decrease her alcohol intake. Patient verbalized understanding. Also suggested that we could do a  nerve conduction studies with EMG however the patient refused. The patient will restart Lyrica 150 mg twice a day. The patient's daughter is asking about narcotic pain medication I advised that we could provide the patient with tramadol however we do not prescribe narcotics. The patient however refuse tramadol. I will refer the patient for physical therapy for gait and balance training. She is amenable to this.. Patient advised that if her symptoms worsen or she develops any new symptoms she should let us know. She will follow-up in 3-4 months with Dr. Lucia Gaskins  I spent 25 minutes with the patient 50% of this time was spent counseling the patient on her diagnosis and treatment.    Butch Penny, MSN, NP-C 04/20/2016, 12:00 PM [facility] [address] [place], [place] [address] [phone]    Personally participated in and made any corrections  needed to history, physical, neuro exam,assessment and plan as stated above, evaluated lab date, reviewed imaging studies and agree with radiology interpretations. At next appointment, patient's daughter should be asked to wait outside so we can see patient alone. She asks for pain medications fo rmother, she encourages her alcohol use ("but helps her mood") and it is difficult to talk to patient as daughter answers all questions thanks.  Naomie DeanAntonia Ahern, MD [facility]

## 2016-04-20 NOTE — Telephone Encounter (Signed)
Called and spoke to RavennaRochelle. Advised pt was seen in office today. New rx called in for different dose of lyrica. 150mg  instead of 75mg  2x/day. Kim picked up call. She stated rx lyrica150mg  2x/day already sent on 12/19/15 by Dr Lucia GaskinsAhern. She is trying to fill it 2 days too early. I advised I will let Tylene FantasiaMegan M, NP know and call back if any changes need to be made. She verbalized understanding.

## 2016-04-20 NOTE — Telephone Encounter (Signed)
Prescription was refilled. However I am inquiring with the pharmacy how often her prescription has been picked up since the dose was changed in January. The pharmacist will call me back with this information.

## 2016-04-20 NOTE — Telephone Encounter (Signed)
Pt's daughter called said she took hard copy of pregabalin (LYRICA) 150 MG capsule to [facility] [id number].  She sts they will be calling to fill this script early or daughter is asking if clinic can call [facility] . Please call daughter when this has been approved.

## 2016-04-20 NOTE — Telephone Encounter (Signed)
I called and gave the verbal order to ok refill lyrica prescription early.  Spoke to pharmacist.

## 2016-04-20 NOTE — Patient Instructions (Signed)
Continue Lyrica 150 mg twice a day Physical therapy  If your symptoms worsen or you develop new symptoms please let us know.

## 2016-04-20 NOTE — Telephone Encounter (Signed)
Spoke to LonerockBobbi, daughter and let her know that the prescription was ok'd for early refill. She would let pt know.

## 2016-04-20 NOTE — Telephone Encounter (Signed)
Kim from [facility] calling stating pt is there requesting early refill on  pregabalin (LYRICA) 150 MG capsule. While typing message call was dropped.

## 2016-04-21 NOTE — Telephone Encounter (Signed)
I spoke to the pharmacy. The patient had her prescription refilled at the beginning of March and again at the end of March. However she did not pick her prescription up till April 13th. Therefore when she presented with a refill request-she was actually 2 days early. During the office visit the patient stated that she was not taking her medication as prescribed. Therefore she should have medication left over? I called the patient to relay this information. She is unsurewhy she does not have medication left over. She again states that she has NOT been taking 2 tablets a day. She states more often she only takes 1 tablet if that. I will refill for now. However at the next office visit we may need to consider testing pregabalin level?

## 2016-04-28 DIAGNOSIS — W57XXXA Bitten or stung by nonvenomous insect and other nonvenomous arthropods, initial encounter: Secondary | ICD-10-CM | POA: Diagnosis not present

## 2016-04-28 DIAGNOSIS — R5383 Other fatigue: Secondary | ICD-10-CM | POA: Diagnosis not present

## 2016-04-29 ENCOUNTER — Ambulatory Visit: Payer: PPO | Admitting: Neurology

## 2016-05-15 DIAGNOSIS — R197 Diarrhea, unspecified: Secondary | ICD-10-CM | POA: Diagnosis not present

## 2016-05-15 DIAGNOSIS — K529 Noninfective gastroenteritis and colitis, unspecified: Secondary | ICD-10-CM | POA: Diagnosis not present

## 2016-05-16 ENCOUNTER — Emergency Department (HOSPITAL_COMMUNITY)
Admission: EM | Admit: 2016-05-16 | Discharge: 2016-05-16 | Disposition: A | Discharge status: Home / Self Care | Payer: PPO | Attending: Emergency Medicine | Admitting: Emergency Medicine

## 2016-05-16 ENCOUNTER — Encounter (HOSPITAL_COMMUNITY): Payer: Self-pay | Admitting: Nurse Practitioner

## 2016-05-16 DIAGNOSIS — R1031 Right lower quadrant pain: Secondary | ICD-10-CM | POA: Diagnosis not present

## 2016-05-16 DIAGNOSIS — K529 Noninfective gastroenteritis and colitis, unspecified: Secondary | ICD-10-CM | POA: Insufficient documentation

## 2016-05-16 DIAGNOSIS — F17210 Nicotine dependence, cigarettes, uncomplicated: Secondary | ICD-10-CM | POA: Diagnosis not present

## 2016-05-16 DIAGNOSIS — E876 Hypokalemia: Secondary | ICD-10-CM | POA: Insufficient documentation

## 2016-05-16 DIAGNOSIS — Z880 Allergy status to penicillin: Secondary | ICD-10-CM | POA: Diagnosis not present

## 2016-05-16 DIAGNOSIS — Z8619 Personal history of other infectious and parasitic diseases: Secondary | ICD-10-CM | POA: Insufficient documentation

## 2016-05-16 DIAGNOSIS — E860 Dehydration: Secondary | ICD-10-CM | POA: Insufficient documentation

## 2016-05-16 DIAGNOSIS — I10 Essential (primary) hypertension: Secondary | ICD-10-CM | POA: Diagnosis not present

## 2016-05-16 DIAGNOSIS — R197 Diarrhea, unspecified: Secondary | ICD-10-CM

## 2016-05-16 DIAGNOSIS — R1030 Lower abdominal pain, unspecified: Secondary | ICD-10-CM

## 2016-05-16 DIAGNOSIS — F329 Major depressive disorder, single episode, unspecified: Secondary | ICD-10-CM | POA: Insufficient documentation

## 2016-05-16 DIAGNOSIS — R110 Nausea: Secondary | ICD-10-CM | POA: Diagnosis not present

## 2016-05-16 DIAGNOSIS — F419 Anxiety disorder, unspecified: Secondary | ICD-10-CM | POA: Insufficient documentation

## 2016-05-16 DIAGNOSIS — Z79899 Other long term (current) drug therapy: Secondary | ICD-10-CM | POA: Insufficient documentation

## 2016-05-16 LAB — COMPREHENSIVE METABOLIC PANEL
ALBUMIN: 3.4 g/dL — AB (ref 3.5–5.0)
ALK PHOS: 100 U/L (ref 38–126)
ALT: 11 U/L — ABNORMAL LOW (ref 14–54)
ANION GAP: 10 (ref 5–15)
AST: 28 U/L (ref 15–41)
BUN: 11 mg/dL (ref 6–20)
CO2: 24 mmol/L (ref 22–32)
Calcium: 8.8 mg/dL — ABNORMAL LOW (ref 8.9–10.3)
Chloride: 101 mmol/L (ref 101–111)
Creatinine, Ser: 0.64 mg/dL (ref 0.44–1.00)
GFR calc Af Amer: >60 mL/min (ref >60)
GFR calc non Af Amer: >60 mL/min (ref >60)
GLUCOSE: 78 mg/dL (ref 65–99)
POTASSIUM: 2.4 mmol/L — AB (ref 3.5–5.1)
SODIUM: 135 mmol/L (ref 135–145)
Total Bilirubin: 0.1 mg/dL — ABNORMAL LOW (ref 0.3–1.2)
Total Protein: 6.3 g/dL — ABNORMAL LOW (ref 6.5–8.1)

## 2016-05-16 LAB — CBC WITH DIFFERENTIAL/PLATELET
BASOS ABS: 0 10*3/uL (ref 0.0–0.1)
Basophils Relative: 0 %
Eosinophils Absolute: 0.1 10*3/uL (ref 0.0–0.7)
Eosinophils Relative: 1 %
HEMATOCRIT: 42.1 % (ref 36.0–46.0)
Hemoglobin: 14.5 g/dL (ref 12.0–15.0)
LYMPHS ABS: 2.1 10*3/uL (ref 0.7–4.0)
LYMPHS PCT: 36 %
MCH: 37.8 pg — ABNORMAL HIGH (ref 26.0–34.0)
MCHC: 34.4 g/dL (ref 30.0–36.0)
MCV: 109.6 fL — AB (ref 78.0–100.0)
MONO ABS: 0.7 10*3/uL (ref 0.1–1.0)
MONOS PCT: 11 %
NEUTROS ABS: 3 10*3/uL (ref 1.7–7.7)
Neutrophils Relative %: 52 %
Platelets: 273 10*3/uL (ref 150–400)
RBC: 3.84 MIL/uL — ABNORMAL LOW (ref 3.87–5.11)
RDW: 14.5 % (ref 11.5–15.5)
WBC: 5.8 10*3/uL (ref 4.0–10.5)

## 2016-05-16 LAB — URINALYSIS, ROUTINE W REFLEX MICROSCOPIC
Bilirubin Urine: NEGATIVE
GLUCOSE, UA: NEGATIVE mg/dL
Hgb urine dipstick: NEGATIVE
Ketones, ur: NEGATIVE mg/dL
LEUKOCYTES UA: NEGATIVE
Nitrite: NEGATIVE
PH: 6 (ref 5.0–8.0)
Protein, ur: NEGATIVE mg/dL
SPECIFIC GRAVITY, URINE: 1.008 (ref 1.005–1.030)

## 2016-05-16 MED ORDER — PROMETHAZINE HCL 25 MG/ML IJ SOLN
12.5000 mg | Freq: Once | INTRAMUSCULAR | Status: AC
  Administered 2016-05-16: 12.5 mg via INTRAVENOUS
  Filled 2016-05-16 (×2): qty 1

## 2016-05-16 MED ORDER — NICOTINE 7 MG/24HR TD PT24: 7.0000 mg | MEDICATED_PATCH | Freq: Once | TRANSDERMAL | Status: DC

## 2016-05-16 MED ORDER — NICOTINE 21 MG/24HR TD PT24
21.0000 mg | MEDICATED_PATCH | Freq: Once | TRANSDERMAL | Status: DC
  Administered 2016-05-16: 21 mg via TRANSDERMAL

## 2016-05-16 MED ORDER — POTASSIUM CHLORIDE CRYS ER 20 MEQ PO TBCR: 20.0000 meq | EXTENDED_RELEASE_TABLET | Freq: Every day | ORAL | Status: DC

## 2016-05-16 MED ORDER — ONDANSETRON 4 MG PO TBDP: 4.0000 mg | ORAL_TABLET | Freq: Three times a day (TID) | ORAL | Status: DC | PRN

## 2016-05-16 MED ORDER — PROMETHAZINE HCL 12.5 MG PO TABS: 12.5000 mg | ORAL_TABLET | Freq: Four times a day (QID) | ORAL | Status: DC | PRN

## 2016-05-16 MED ORDER — POTASSIUM CHLORIDE CRYS ER 20 MEQ PO TBCR
80.0000 meq | EXTENDED_RELEASE_TABLET | Freq: Once | ORAL | Status: AC
  Administered 2016-05-16: 80 meq via ORAL
  Filled 2016-05-16: qty 4

## 2016-05-16 MED ORDER — ONDANSETRON HCL 4 MG/2ML IJ SOLN
4.0000 mg | Freq: Once | INTRAMUSCULAR | Status: AC
  Administered 2016-05-16: 4 mg via INTRAVENOUS
  Filled 2016-05-16: qty 2

## 2016-05-16 MED ORDER — SODIUM CHLORIDE 0.9 % IV BOLUS (SEPSIS)
1000.0000 mL | Freq: Once | INTRAVENOUS | Status: AC
  Administered 2016-05-16: 1000 mL via INTRAVENOUS

## 2016-05-16 NOTE — ED Notes (Signed)
Pt verbalized understanding of d/c instructions and has no further questions. Pt stable and NAD. Pt d/c home with family driving. Pt educated on importance of proper fluid intake while having diarrhea.

## 2016-05-16 NOTE — ED Notes (Signed)
Pt requested nicotine patch and Mercedes gave verbal order for nicotine patch.

## 2016-05-16 NOTE — ED Notes (Signed)
CRITICAL VALUE ALERT  Critical value received:  Potassium 2.4  Date of notification:  05/16/2016  Time of notification:  1523  Critical value read back:Yes.    Nurse who received alert:  MG Jessicah Croll,RN  MD notified (1st page): Dr. Adela LankFloyd  Time of first page:  1523  MD notified (2nd page):  Time of second page:  Responding MD:  Dr. Adela LankFloyd  Time MD responded:  [phone]

## 2016-05-16 NOTE — ED Notes (Signed)
Family at bedside. 

## 2016-05-16 NOTE — Discharge Instructions (Signed)
Use zofran as prescribed, as needed for nausea/vomiting. Use phenergan as directed as needed for persistent nausea/vomiting if zofran isn't helping. Take the potassium tablets as directed. Use tylenol or motrin as needed for pain. Stay well hydrated with plenty of fluids like water and gatorade throughout the day. Take your home imodium and/or bentyl as directed by your primary care doctor. Follow a BRAT (banana-rice-applesauce-toast) diet as described below for the next 24-48 hours. The 'BRAT' diet is suggested, then progress to diet as tolerated as symptoms abate. Call your regular doctor if bloody stools, persistent diarrhea, vomiting, fever or abdominal pain. Follow up with your regular doctor in 3 days for recheck of symptoms and repeat lab work. Return to ER for changing or worsening of symptoms.  Food Choices to Help Relieve Diarrhea When you have diarrhea, the foods you eat and your eating habits are very important. Choosing the right foods and drinks can help relieve diarrhea. Also, because diarrhea can last up to 7 days, you need to replace lost fluids and electrolytes (such as sodium, potassium, and chloride) in order to help prevent dehydration.  WHAT GENERAL GUIDELINES DO I NEED TO FOLLOW?  Slowly drink 1 cup (8 oz) of fluid for each episode of diarrhea. If you are getting enough fluid, your urine will be clear or pale yellow.  Eat starchy foods. Some good choices include white rice, white toast, pasta, low-fiber cereal, baked potatoes (without the skin), saltine crackers, and bagels.  Avoid large servings of any cooked vegetables.  Limit fruit to two servings per day. A serving is  cup or 1 small piece.  Choose foods with less than 2 g of fiber per serving.  Limit fats to less than 8 tsp (38 g) per day.  Avoid fried foods.  Eat foods that have probiotics in them. Probiotics can be found in certain dairy products.  Avoid foods and beverages that may increase the speed at which  food moves through the stomach and intestines (gastrointestinal tract). Things to avoid include:  High-fiber foods, such as dried fruit, raw fruits and vegetables, nuts, seeds, and whole grain foods.  Spicy foods and high-fat foods.  Foods and beverages sweetened with high-fructose corn syrup, honey, or sugar alcohols such as xylitol, sorbitol, and mannitol. WHAT FOODS ARE RECOMMENDED? Grains White rice. White, [place], or pita breads (fresh or toasted), including plain rolls, buns, or bagels. White pasta. Saltine, soda, or graham crackers. Pretzels. Low-fiber cereal. Cooked cereals made with water (such as cornmeal, farina, or cream cereals). Plain muffins. Matzo. Melba toast. Zwieback.  Vegetables Potatoes (without the skin). Strained tomato and vegetable juices. Most well-cooked and canned vegetables without seeds. Tender lettuce. Fruits Cooked or canned applesauce, apricots, cherries, fruit cocktail, grapefruit, peaches, pears, or plums. Fresh bananas, apples without skin, cherries, grapes, cantaloupe, grapefruit, peaches, oranges, or plums.  Meat and Other Protein Products Baked or boiled chicken. Eggs. Tofu. Fish. Seafood. Smooth peanut butter. Ground or well-cooked tender beef, ham, veal, lamb, pork, or poultry.  Dairy Plain yogurt, kefir, and unsweetened liquid yogurt. Lactose-free milk, buttermilk, or soy milk. Plain hard cheese. Beverages Sport drinks. Clear broths. Diluted fruit juices (except prune). Regular, caffeine-free sodas such as ginger ale. Water. Decaffeinated teas. Oral rehydration solutions. Sugar-free beverages not sweetened with sugar alcohols. Other Bouillon, broth, or soups made from recommended foods.  The items listed above may not be a complete list of recommended foods or beverages. Contact your dietitian for more options. WHAT FOODS ARE NOT RECOMMENDED? Grains Whole grain, whole  wheat, bran, or rye breads, rolls, pastas, crackers, and cereals. Wild or brown  rice. Cereals that contain more than 2 g of fiber per serving. Corn tortillas or taco shells. Cooked or dry oatmeal. Granola. Popcorn. Vegetables Raw vegetables. Cabbage, broccoli, Brussels sprouts, artichokes, baked beans, beet greens, corn, kale, legumes, peas, sweet potatoes, and yams. Potato skins. Cooked spinach and cabbage. Fruits Dried fruit, including raisins and dates. Raw fruits. Stewed or dried prunes. Fresh apples with skin, apricots, mangoes, pears, raspberries, and strawberries.  Meat and Other Protein Products Chunky peanut butter. Nuts and seeds. Beans and lentils. Tomasa Blase.  Dairy High-fat cheeses. Milk, chocolate milk, and beverages made with milk, such as milk shakes. Cream. Ice cream. Sweets and Desserts Sweet rolls, doughnuts, and sweet breads. Pancakes and waffles. Fats and Oils Butter. Cream sauces. Margarine. Salad oils. Plain salad dressings. Olives. Avocados.  Beverages Caffeinated beverages (such as coffee, tea, soda, or energy drinks). Alcoholic beverages. Fruit juices with pulp. Prune juice. Soft drinks sweetened with high-fructose corn syrup or sugar alcohols. Other Coconut. Hot sauce. Chili powder. Mayonnaise. Gravy. Cream-based or milk-based soups.  The items listed above may not be a complete list of foods and beverages to avoid. Contact your dietitian for more information. WHAT SHOULD I DO IF I BECOME DEHYDRATED? Diarrhea can sometimes lead to dehydration. Signs of dehydration include dark urine and dry mouth and skin. If you think you are dehydrated, you should rehydrate with an oral rehydration solution. These solutions can be purchased at pharmacies, retail stores, or online.  Drink -1 cup (120-240 mL) of oral rehydration solution each time you have an episode of diarrhea. If drinking this amount makes your diarrhea worse, try drinking smaller amounts more often. For example, drink 1-3 tsp (5-15 mL) every 5-10 minutes.  A general rule for staying hydrated is to  drink 1-2 L of fluid per day. Talk to your health care provider about the specific amount you should be drinking each day. Drink enough fluids to keep your urine clear or pale yellow. Document Released: 02/20/2004 Document Revised: 12/05/2013 Document Reviewed: 10/23/2013 [facility] Patient Information 2015 [place], [place]. This information is not intended to replace advice given to you by your health care provider. Make sure you discuss any questions you have with your health care provider.    Nausea and Vomiting Nausea means you feel sick to your stomach. Throwing up (vomiting) is a reflex where stomach contents come out of your mouth. HOME CARE   Take medicine as told by your doctor.  Do not force yourself to eat. However, you do need to drink fluids.  If you feel like eating, eat a normal diet as told by your doctor.  Eat rice, wheat, potatoes, bread, lean meats, yogurt, fruits, and vegetables.  Avoid high-fat foods.  Drink enough fluids to keep your pee (urine) clear or pale yellow.  Ask your doctor how to replace body fluid losses (rehydrate). Signs of body fluid loss (dehydration) include:  Feeling very thirsty.  Dry lips and mouth.  Feeling dizzy.  Dark pee.  Peeing less than normal.  Feeling confused.  Fast breathing or heart rate. GET HELP RIGHT AWAY IF:   You have blood in your throw up.  You have black or bloody poop (stool).  You have a bad headache or stiff neck.  You feel confused.  You have bad belly (abdominal) pain.  You have chest pain or trouble breathing.  You do not pee at least once every 8 hours.  You have  cold, clammy skin.  You keep throwing up after 24 to 48 hours.  You have a fever. MAKE SURE YOU:   Understand these instructions.  Will watch your condition.  Will get help right away if you are not doing well or get worse.   This information is not intended to replace advice given to you by your health care provider. Make  sure you discuss any questions you have with your health care provider.   Document Released: 05/18/2008 Document Revised: 02/22/2012 Document Reviewed: 05/01/2011 Elsevier Interactive Patient Education 2016 [employer]  Diarrhea Diarrhea is frequent loose and watery bowel movements. It can cause you to feel weak and dehydrated. Dehydration can cause you to become tired and thirsty, have a dry mouth, and have decreased urination that often is dark yellow. Diarrhea is a sign of another problem, most often an infection that will not last long. In most cases, diarrhea typically lasts 2-3 days. However, it can last longer if it is a sign of something more serious. It is important to treat your diarrhea as directed by your caregiver to lessen or prevent future episodes of diarrhea. CAUSES  Some common causes include:  Gastrointestinal infections caused by viruses, bacteria, or parasites.  Food poisoning or food allergies.  Certain medicines, such as antibiotics, chemotherapy, and laxatives.  Artificial sweeteners and fructose.  Digestive disorders. HOME CARE INSTRUCTIONS  Ensure adequate fluid intake (hydration): Have 1 cup (8 oz) of fluid for each diarrhea episode. Avoid fluids that contain simple sugars or sports drinks, fruit juices, whole milk products, and sodas. Your urine should be clear or pale yellow if you are drinking enough fluids. Hydrate with an oral rehydration solution that you can purchase at pharmacies, retail stores, and online. You can prepare an oral rehydration solution at home by mixing the following ingredients together:   - tsp table salt.   tsp baking soda.   tsp salt substitute containing potassium chloride.  1  tablespoons sugar.  1 L (34 oz) of water.  Certain foods and beverages may increase the speed at which food moves through the gastrointestinal (GI) tract. These foods and beverages should be avoided and include:  Caffeinated and alcoholic  beverages.  High-fiber foods, such as raw fruits and vegetables, nuts, seeds, and whole grain breads and cereals.  Foods and beverages sweetened with sugar alcohols, such as xylitol, sorbitol, and mannitol.  Some foods may be well tolerated and may help thicken stool including:  Starchy foods, such as rice, toast, pasta, low-sugar cereal, oatmeal, grits, baked potatoes, crackers, and bagels.  Bananas.  Applesauce.  Add probiotic-rich foods to help increase healthy bacteria in the GI tract, such as yogurt and fermented milk products.  Wash your hands well after each diarrhea episode.  Only take over-the-counter or prescription medicines as directed by your caregiver.  Take a warm bath to relieve any burning or pain from frequent diarrhea episodes. SEEK IMMEDIATE MEDICAL CARE IF:   You are unable to keep fluids down.  You have persistent vomiting.  You have blood in your stool, or your stools are black and tarry.  You do not urinate in 6-8 hours, or there is only a small amount of very dark urine.  You have abdominal pain that increases or localizes.  You have weakness, dizziness, confusion, or light-headedness.  You have a severe headache.  Your diarrhea gets worse or does not get better.  You have a fever or persistent symptoms for more than 2-3 days.  You have  a fever and your symptoms suddenly get worse. MAKE SURE YOU:   Understand these instructions.  Will watch your condition.  Will get help right away if you are not doing well or get worse.   This information is not intended to replace advice given to you by your health care provider. Make sure you discuss any questions you have with your health care provider.   Document Released: 11/20/2002 Document Revised: 12/21/2014 Document Reviewed: 08/07/2012 Elsevier Interactive Patient Education 2016 Elsevier Inc.  Dehydration Dehydration is when you lose more fluids from the body than you take in. Vital organs  such as the kidneys, brain, and heart cannot function without a proper amount of fluids and salt. Any loss of fluids from the body can cause dehydration.  Older adults are at a higher risk of dehydration than younger adults. As we age, our bodies are less able to conserve water and do not respond to temperature changes as well. Also, older adults do not become thirsty as easily or quickly. Because of this, older adults often do not realize they need to increase fluids to avoid dehydration.  CAUSES   Vomiting.  Diarrhea.  Excessive sweating.  Excessive urination.  Fever.  Certain medicines, such as blood pressure medicines called diuretics.  Poorly controlled blood sugars. SIGNS AND SYMPTOMS  Mild dehydration:  Thirst.  Dry lips.  Slightly dry mouth. Moderate dehydration:  Very dry mouth.  Sunken eyes.  Skin does not bounce back quickly when lightly pinched and released.  Dark urine and decreased urine production.  Decreased tear production.  Headache. Severe dehydration:  Very dry mouth.  Extreme thirst.  Rapid, weak pulse (more than 100 beats per minute at rest).  Cold hands and feet.  Not able to sweat in spite of heat.  Rapid breathing.  Blue lips.  Confusion and lethargy.  Difficulty being awakened.  Minimal urine production.  No tears. DIAGNOSIS  Your health care provider will diagnose dehydration based on your symptoms and your exam. Blood and urine tests will help confirm the diagnosis. The diagnostic evaluation should also identify the cause of dehydration. TREATMENT  Treatment of mild or moderate dehydration can often be done at home by increasing the amount of fluids that you drink. It is best to drink small amounts of fluid more often. Drinking too much at one time can make vomiting worse. Severe dehydration needs to be treated at the hospital. You may be given IV fluids that contain water and electrolytes. HOME CARE INSTRUCTIONS   Ask  your health care provider about specific rehydration instructions.  Drink enough fluids to keep your urine clear or pale yellow.  Drink small amounts frequently if you have nausea and vomiting.  Eat as you normally do.  Avoid:  Foods or drinks high in sugar.  Carbonated drinks.  Juice.  Extremely hot or cold fluids.  Drinks with caffeine.  Fatty, greasy foods.  Alcohol.  Tobacco.  Overeating.  Gelatin desserts.  Wash your hands well to avoid spreading bacteria and viruses.  Only take over-the-counter or prescription medicines for pain, discomfort, or fever as directed by your health care provider.  Ask your health care provider if you should continue all prescribed and over-the-counter medicines.  Keep all follow-up appointments with your health care provider. SEEK MEDICAL CARE IF:  You have abdominal pain, and it increases or stays in one area (localizes).  You have a rash, stiff neck, or severe headache.  You are irritable, sleepy, or difficult to awaken.  You are weak, dizzy, or extremely thirsty.  You have a fever. SEEK IMMEDIATE MEDICAL CARE IF:   You are unable to keep fluids down, or you get worse despite treatment.  You have frequent episodes of vomiting or diarrhea.  You have blood or green matter (bile) in your vomit.  You have blood in your stool, or your stool looks black and tarry.  You have not urinated in 6-8 hours, or you have only urinated a small amount of very dark urine.  You faint. MAKE SURE YOU:   Understand these instructions.  Will watch your condition.  Will get help right away if you are not doing well or get worse.   This information is not intended to replace advice given to you by your health care provider. Make sure you discuss any questions you have with your health care provider.   Document Released: 02/20/2004 Document Revised: 12/05/2013 Document Reviewed: 08/07/2013 Elsevier Interactive Patient Education 2016  [employer].  Hypokalemia Hypokalemia means that the amount of potassium in the blood is lower than normal.Potassium is a chemical, called an electrolyte, that helps regulate the amount of fluid in the body. It also stimulates muscle contraction and helps nerves function properly.Most of the body's potassium is inside of cells, and only a very small amount is in the blood. Because the amount in the blood is so small, minor changes can be life-threatening. CAUSES  Antibiotics.  Diarrhea or vomiting.  Using laxatives too much, which can cause diarrhea.  Chronic kidney disease.  Water pills (diuretics).  Eating disorders (bulimia).  Low magnesium level.  Sweating a lot. SIGNS AND SYMPTOMS  Weakness.  Constipation.  Fatigue.  Muscle cramps.  Mental confusion.  Skipped heartbeats or irregular heartbeat (palpitations).  Tingling or numbness. DIAGNOSIS  Your health care provider can diagnose hypokalemia with blood tests. In addition to checking your potassium level, your health care provider may also check other lab tests. TREATMENT Hypokalemia can be treated with potassium supplements taken by mouth or adjustments in your current medicines. If your potassium level is very low, you may need to get potassium through a vein (IV) and be monitored in the hospital. A diet high in potassium is also helpful. Foods high in potassium are:  Nuts, such as peanuts and pistachios.  Seeds, such as sunflower seeds and pumpkin seeds.  Peas, lentils, and lima beans.  Whole grain and bran cereals and breads.  Fresh fruit and vegetables, such as apricots, avocado, bananas, cantaloupe, kiwi, oranges, tomatoes, asparagus, and potatoes.  Orange and tomato juices.  Red meats.  Fruit yogurt. HOME CARE INSTRUCTIONS  Take all medicines as prescribed by your health care provider.  Maintain a healthy diet by including nutritious food, such as fruits, vegetables, nuts, whole grains, and  lean meats.  If you are taking a laxative, be sure to follow the directions on the label. SEEK MEDICAL CARE IF:  Your weakness gets worse.  You feel your heart pounding or racing.  You are vomiting or having diarrhea.  You are diabetic and having trouble keeping your blood glucose in the normal range. SEEK IMMEDIATE MEDICAL CARE IF:  You have chest pain, shortness of breath, or dizziness.  You are vomiting or having diarrhea for more than 2 days.  You faint. MAKE SURE YOU:   Understand these instructions.  Will watch your condition.  Will get help right away if you are not doing well or get worse.   This information is not intended to replace advice  given to you by your health care provider. Make sure you discuss any questions you have with your health care provider.   Document Released: 11/30/2005 Document Revised: 12/21/2014 Document Reviewed: 06/02/2013 Elsevier Interactive Patient Education 2016 Elsevier Inc.   Potassium Content of Foods Potassium is a mineral found in many foods and drinks. It helps keep fluids and minerals balanced in your body and affects how steadily your heart beats. Potassium also helps control your blood pressure and keep your muscles and nervous system healthy. Certain health conditions and medicines may change the balance of potassium in your body. When this happens, you can help balance your level of potassium through the foods that you do or do not eat. Your health care provider or dietitian may recommend an amount of potassium that you should have each day. The following lists of foods provide the amount of potassium (in parentheses) per serving in each item. HIGH IN POTASSIUM  The following foods and beverages have 200 mg or more of potassium per serving:  Apricots, 2 raw or 5 dry (200 mg).  Artichoke, 1 medium (345 mg).  Avocado, raw,  each (245 mg).  Banana, 1 medium (425 mg).  Beans, lima, or baked beans, canned,  cup (280  mg).  Beans, white, canned,  cup (595 mg).  Beef roast, 3 oz (320 mg).  Beef, ground, 3 oz (270 mg).  Beets, raw or cooked,  cup (260 mg).  Bran muffin, 2 oz (300 mg).  Broccoli,  cup (230 mg).  Brussels sprouts,  cup (250 mg).  Cantaloupe,  cup (215 mg).  Cereal, 100% bran,  cup (200-400 mg).  Cheeseburger, single, fast food, 1 each (225-400 mg).  Chicken, 3 oz (220 mg).  Clams, canned, 3 oz (535 mg).  Crab, 3 oz (225 mg).  Dates, 5 each (270 mg).  Dried beans and peas,  cup (300-475 mg).  Figs, dried, 2 each (260 mg).  Fish: halibut, tuna, cod, snapper, 3 oz (480 mg).  Fish: salmon, haddock, swordfish, perch, 3 oz (300 mg).  Fish, tuna, canned 3 oz (200 mg).  [place] fries, fast food, 3 oz (470 mg).  Granola with fruit and nuts,  cup (200 mg).  Grapefruit juice,  cup (200 mg).  Greens, beet,  cup (655 mg).  Honeydew melon,  cup (200 mg).  Kale, raw, 1 cup (300 mg).  Kiwi, 1 medium (240 mg).  Kohlrabi, rutabaga, parsnips,  cup (280 mg).  Lentils,  cup (365 mg).  Mango, 1 each (325 mg).  Milk, chocolate, 1 cup (420 mg).  Milk: nonfat, low-fat, whole, buttermilk, 1 cup (350-380 mg).  Molasses, 1 Tbsp (295 mg).  Mushrooms,  cup (280) mg.  Nectarine, 1 each (275 mg).  Nuts: almonds, peanuts, hazelnuts, [place], cashew, mixed, 1 oz (200 mg).  Nuts, pistachios, 1 oz (295 mg).  Orange, 1 each (240 mg).  Orange juice,  cup (235 mg).  Papaya, medium,  fruit (390 mg).  Peanut butter, chunky, 2 Tbsp (240 mg).  Peanut butter, smooth, 2 Tbsp (210 mg).  Pear, 1 medium (200 mg).  Pomegranate, 1 whole (400 mg).  Pomegranate juice,  cup (215 mg).  Pork, 3 oz (350 mg).  Potato chips, salted, 1 oz (465 mg).  Potato, baked with skin, 1 medium (925 mg).  Potatoes, boiled,  cup (255 mg).  Potatoes, mashed,  cup (330 mg).  Prune juice,  cup (370 mg).  Prunes, 5 each (305 mg).  Pudding, chocolate,  cup (230  mg).  Pumpkin,  canned,  cup (250 mg).  Raisins, seedless,  cup (270 mg).  Seeds, sunflower or pumpkin, 1 oz (240 mg).  Soy milk, 1 cup (300 mg).  Spinach,  cup (420 mg).  Spinach, canned,  cup (370 mg).  Sweet potato, baked with skin, 1 medium (450 mg).  Swiss chard,  cup (480 mg).  Tomato or vegetable juice,  cup (275 mg).  Tomato sauce or puree,  cup (400-550 mg).  Tomato, raw, 1 medium (290 mg).  Tomatoes, canned,  cup (200-300 mg).  [place], 3 oz (250 mg).  Wheat germ, 1 oz (250 mg).  Winter squash,  cup (250 mg).  Yogurt, plain or fruited, 6 oz (260-435 mg).  Zucchini,  cup (220 mg). MODERATE IN POTASSIUM The following foods and beverages have 50-200 mg of potassium per serving:  Apple, 1 each (150 mg).  Apple juice,  cup (150 mg).  Applesauce,  cup (90 mg).  Apricot nectar,  cup (140 mg).  Asparagus, small spears,  cup or 6 spears (155 mg).  Bagel, cinnamon raisin, 1 each (130 mg).  Bagel, egg or plain, 4 in., 1 each (70 mg).  Beans, green,  cup (90 mg).  Beans, yellow,  cup (190 mg).  Beer, regular, 12 oz (100 mg).  Beets, canned,  cup (125 mg).  Blackberries,  cup (115 mg).  Blueberries,  cup (60 mg).  Bread, whole wheat, 1 slice (70 mg).  Broccoli, raw,  cup (145 mg).  Cabbage,  cup (150 mg).  Carrots, cooked or raw,  cup (180 mg).  Cauliflower, raw,  cup (150 mg).  Celery, raw,  cup (155 mg).  Cereal, bran flakes, cup (120-150 mg).  Cheese, cottage,  cup (110 mg).  Cherries, 10 each (150 mg).  Chocolate, 1 oz bar (165 mg).  Coffee, brewed 6 oz (90 mg).  Corn,  cup or 1 ear (195 mg).  Cucumbers,  cup (80 mg).  Egg, large, 1 each (60 mg).  Eggplant,  cup (60 mg).  Endive, raw, cup (80 mg).  English muffin, 1 each (65 mg).  Fish, orange roughy, 3 oz (150 mg).  Frankfurter, beef or pork, 1 each (75 mg).  Fruit cocktail,  cup (115 mg).  Grape juice,  cup (170  mg).  Grapefruit,  fruit (175 mg).  Grapes,  cup (155 mg).  Greens: kale, turnip, collard,  cup (110-150 mg).  Ice cream or frozen yogurt, chocolate,  cup (175 mg).  Ice cream or frozen yogurt, vanilla,  cup (120-150 mg).  Lemons, limes, 1 each (80 mg).  Lettuce, all types, 1 cup (100 mg).  Mixed vegetables,  cup (150 mg).  Mushrooms, raw,  cup (110 mg).  Nuts: walnuts, pecans, or macadamia, 1 oz (125 mg).  Oatmeal,  cup (80 mg).  Okra,  cup (110 mg).  Onions, raw,  cup (120 mg).  Peach, 1 each (185 mg).  Peaches, canned,  cup (120 mg).  Pears, canned,  cup (120 mg).  Peas, green, frozen,  cup (90 mg).  Peppers, green,  cup (130 mg).  Peppers, red,  cup (160 mg).  Pineapple juice,  cup (165 mg).  Pineapple, fresh or canned,  cup (100 mg).  Plums, 1 each (105 mg).  Pudding, vanilla,  cup (150 mg).  Raspberries,  cup (90 mg).  Rhubarb,  cup (115 mg).  Rice, wild,  cup (80 mg).  Shrimp, 3 oz (155 mg).  Spinach, raw, 1 cup (170 mg).  Strawberries,  cup (125 mg).  Summer squash  cup (175-200 mg).  Swiss chard, raw, 1 cup (135 mg).  Tangerines, 1 each (140 mg).  Tea, brewed, 6 oz (65 mg).  Turnips,  cup (140 mg).  Watermelon,  cup (85 mg).  Wine, red, table, 5 oz (180 mg).  Wine, white, table, 5 oz (100 mg). LOW IN POTASSIUM The following foods and beverages have less than 50 mg of potassium per serving.  Bread, white, 1 slice (30 mg).  Carbonated beverages, 12 oz (less than 5 mg).  Cheese, 1 oz (20-30 mg).  Cranberries,  cup (45 mg).  Cranberry juice cocktail,  cup (20 mg).  Fats and oils, 1 Tbsp (less than 5 mg).  Hummus, 1 Tbsp (32 mg).  Nectar: papaya, mango, or pear,  cup (35 mg).  Rice, white or brown,  cup (50 mg).  Spaghetti or macaroni,  cup cooked (30 mg).  Tortilla, flour or corn, 1 each (50 mg).  Waffle, 4 in., 1 each (50 mg).  Water chestnuts,  cup (40 mg).   This information  is not intended to replace advice given to you by your health care provider. Make sure you discuss any questions you have with your health care provider.   Document Released: 07/14/2005 Document Revised: 12/05/2013 Document Reviewed: 10/27/2013 Elsevier Interactive Patient Education 2016 [employer] Abdominal Pain, Adult Many things can cause belly (abdominal) pain. Most times, the belly pain is not dangerous. Many cases of belly pain can be watched and treated at home. HOME CARE   Do not take medicines that help you go poop (laxatives) unless told to by your doctor.  Only take medicine as told by your doctor.  Eat or drink as told by your doctor. Your doctor will tell you if you should be on a special diet. GET HELP IF:  You do not know what is causing your belly pain.  You have belly pain while you are sick to your stomach (nauseous) or have runny poop (diarrhea).  You have pain while you pee or poop.  Your belly pain wakes you up at night.  You have belly pain that gets worse or better when you eat.  You have belly pain that gets worse when you eat fatty foods.  You have a fever. GET HELP RIGHT AWAY IF:   The pain does not go away within 2 hours.  You keep throwing up (vomiting).  The pain changes and is only in the right or left part of the belly.  You have bloody or tarry looking poop. MAKE SURE YOU:   Understand these instructions.  Will watch your condition.  Will get help right away if you are not doing well or get worse.   This information is not intended to replace advice given to you by your health care provider. Make sure you discuss any questions you have with your health care provider.   Document Released: 05/18/2008 Document Revised: 12/21/2014 Document Reviewed: 08/09/2013 Elsevier Interactive Patient Education [employer]

## 2016-05-16 NOTE — ED Notes (Addendum)
Pt ambulated to restroom. Pt has had 2 episodes of diarrhea with inability to get to the restroom in time in the last 30 minutes.

## 2016-05-16 NOTE — ED Notes (Signed)
She c/o 6 day history of diarrhea and R sided abd/pelvic pain. She reports nasuea. She denies vomiting, fevers. SHe took immodium at home with no relief of the diarrhea. She went to her PCP office today and her bp was 90s/60s so they sent her to the ED for further evaluation. She is alert and breathing easily.

## 2016-05-16 NOTE — ED Provider Notes (Signed)
CSN: [id number]     Arrival date & time 05/16/16  1314 History   First MD Initiated Contact with Patient 05/16/16 1333     Chief Complaint  Patient presents with  . GI Problem     (Consider location/radiation/quality/duration/timing/severity/associated sxs/prior Treatment) HPI Comments: April [name] is a 70 y.o. female with a PMHx of HLD, peripheral edema, anxiety, depression, and remote shingles, who presents to the ED with complaints of right lower abdominal pain and diarrhea 6 days. She describes her abdominal pain as 2/10 constant aching nonradiating right lower quadrant pain, worse with sitting, with no treatments tried prior to arrival. She states she has had approximately 3 episodes daily of nonbloody watery diarrhea, she has taken Imodium with some mild relief. Additional associated symptoms include nausea and fatigue.   She states she went to her PCP's office yesterday and her BP was lower than normal, 98/68 yesterday, told to come back if her BP was <90/65, and when her daughter checked it at home it was 90/60 so she went back to her PCP's office today and it was 90/62 so they told her to come here for fluids/further eval. Chart review reveals that at the PCP visit yesterday she was diagnosed with gastroenteritis and told to take dicyclomine instead of immodium for her symptoms, had a GI panel ordered which pt states she dropped off today, and was told to go to the ER if her symptoms worsened, she was lightheaded, or her BP was <90/65; otherwise, f/up Monday with them. She went back today and the encounter BP was 90/62, no notes in the system yet, but pt reports that they told her to come here. She admits that she has not been drinking fluids to replenish what she has been losing from the diarrhea. She has not taken anything aside from Imodium. Her daughter states that the blood pressure cuff that they have at home is a normal adult cuff not a small adult cuff. Patient admits that she  drinks "sips of brandy" daily, took a few sips this morning, states that this helps with her anxiety so that she doesn't need to take Klonopin, and reports that this is a chronic daily occurrence. She also reports chronic NSAID use in the form of Goody's powders.  She denies any fevers, chills, lightheadedness, chest pain, shortness breath, vomiting, constipation, obstipation, melena, hematochezia, dysuria, hematuria, numbness, tingling, weakness, recent travel, sick contacts, suspicious food intake, or recent antibiotic use. Of note, she states that she is no longer on any antihypertensives because her hypertension has resolved and she does not need medications any more.  Patient is a 70 y.o. female presenting with GI illness. The history is provided by the patient and medical records. No language interpreter was used.  GI Problem This is a new problem. The current episode started in the past 7 days. The problem occurs constantly. The problem has been unchanged. Associated symptoms include abdominal pain, fatigue and nausea. Pertinent negatives include no arthralgias, chest pain, chills, fever, myalgias, numbness, urinary symptoms, vomiting or weakness. Nothing aggravates the symptoms. Treatments tried: immodium. The treatment provided mild relief.    Past Medical History  Diagnosis Date  . Anxiety   . Hypertension   . Leg edema   . Depression   . Hyperlipidemia   . History of shingles summer 2015   Past Surgical History  Procedure Laterality Date  . Removal of uterine fibroids  2004   Family History  Problem Relation Age of Onset  .  Heart disease Father   . Neuropathy Neg Hx    Social History  Substance Use Topics  . Smoking status: Current Every Day Smoker -- 1.00 packs/day    Types: Cigarettes  . Smokeless tobacco: None  . Alcohol Use: Yes     Comment: 2 glasses of brandy per night   OB History    No data available     Review of Systems  Constitutional: Positive for  fatigue. Negative for fever and chills.  Respiratory: Negative for shortness of breath.   Cardiovascular: Negative for chest pain.  Gastrointestinal: Positive for nausea, abdominal pain and diarrhea. Negative for vomiting, constipation, blood in stool and anal bleeding.  Genitourinary: Negative for dysuria and hematuria.  Musculoskeletal: Negative for myalgias and arthralgias.  Skin: Negative for color change.  Allergic/Immunologic: Negative for immunocompromised state.  Neurological: Negative for weakness, light-headedness and numbness.  Psychiatric/Behavioral: Negative for confusion.   10 Systems reviewed and are negative for acute change except as noted in the HPI.    Allergies  Naltrexone and Penicillins  Home Medications   Prior to Admission medications   Medication Sig Start Date End Date Taking? Authorizing Provider  clonazePAM (KLONOPIN) 0.5 MG tablet Take 0.5 mg by mouth 2 (two) times daily as needed for anxiety.    Historical Provider, MD  escitalopram (LEXAPRO) 20 MG tablet Take 20 mg by mouth daily.    Historical Provider, MD  pregabalin (LYRICA) 150 MG capsule Take 1 capsule (150 mg total) by mouth 2 (two) times daily. 04/20/16   Butch PennyMegan Millikan, NP   BP 122/74 mmHg  Pulse 82  Temp(Src) 98.3 F (36.8 C) (Oral)  Resp 17  SpO2 97% Physical Exam  Constitutional: She is oriented to person, place, and time. Vital signs are normal. She appears well-developed and well-nourished.  Non-toxic appearance. No distress.  Afebrile, nontoxic, NAD, thin woman, well appearing, BP 122/74  HENT:  Head: Normocephalic and atraumatic.  Mouth/Throat: Mucous membranes are dry.  Mildly dry mucous membranes  Eyes: Conjunctivae and EOM are normal. Right eye exhibits no discharge. Left eye exhibits no discharge.  Neck: Normal range of motion. Neck supple.  Cardiovascular: Normal rate, regular rhythm, normal heart sounds and intact distal pulses.  Exam reveals no gallop and no friction rub.     No murmur heard. Pulmonary/Chest: Effort normal and breath sounds normal. No respiratory distress. She has no decreased breath sounds. She has no wheezes. She has no rhonchi. She has no rales.  Abdominal: Soft. Normal appearance and bowel sounds are normal. She exhibits no distension. There is tenderness in the right lower quadrant and suprapubic area. There is no rigidity, no rebound, no guarding, no CVA tenderness, no tenderness at McBurney's point and negative Murphy's sign.    Soft, nondistended, +BS throughout, with very mild suprapubic and RLQ TTP, no r/g/r, neg murphy's, neg mcburney's, no CVA TTP   Genitourinary:  DRE deferred due to patient preference  Musculoskeletal: Normal range of motion.  Neurological: She is alert and oriented to person, place, and time. She has normal strength. No sensory deficit.  Skin: Skin is warm, dry and intact. No rash noted.  Psychiatric: She has a normal mood and affect.  Nursing note and vitals reviewed.   ED Course  Procedures (including critical care time) Labs Review Labs Reviewed  COMPREHENSIVE METABOLIC PANEL - Abnormal; Notable for the following:    Potassium 2.4 (*)    Calcium 8.8 (*)    Total Protein 6.3 (*)    Albumin  3.4 (*)    ALT 11 (*)    Total Bilirubin 0.1 (*)    All other components within normal limits  CBC WITH DIFFERENTIAL/PLATELET - Abnormal; Notable for the following:    RBC 3.84 (*)    MCV 109.6 (*)    MCH 37.8 (*)    All other components within normal limits  URINALYSIS, ROUTINE W REFLEX MICROSCOPIC (NOT AT [facility])    Imaging Review No results found. I have personally reviewed and evaluated these images and lab results as part of my medical decision-making.   EKG Interpretation   Date/Time:  Saturday May 16 2016 15:50:01 EDT Ventricular Rate:  66 PR Interval:  144 QRS Duration: 96 QT Interval:  437 QTC Calculation: 458 R Axis:   -69 Text Interpretation:  Sinus rhythm Left anterior fascicular block   Borderline low voltage, extremity leads pvc vs background noise No old  tracing to compare Confirmed by FLOYD MD, DANIEL [phone]) on 05/16/2016  4:28:44 PM      MDM   Final diagnoses:  Lower abdominal pain  Nausea  Diarrhea, unspecified type  Hypokalemia  Dehydration  Gastroenteritis    70 y.o. female here with 6 days of RLQ abd pain, diarrhea, and nausea. Taking immodium with some mild relief. Seen by her PCP yesterday, diagnosed with gastroenteritis, told to f/up Monday, but her BP was lower today so she went back and they checked it, 90/62 in the office, so she was sent for fluids and further eval. On exam, very mild suprapubic/RLQ TTP, nonperitoneal without r/g/r, BP 122/74 and pt well appearing overall. She admits she hasn't been drinking fluids at home. Mildly dry mucous membranes. Drinks alcohol regularly, and takes NSAIDs regularly. Denies melena/hematochezia. Will get labs to ensure no electrolyte abnormalities or acute anemia (if present, will get FOBT, but for now will hold off due to pt preference). GI panel already in process at her PCPs office. Doubt need for imaging at this time, pt overall well appearing and symptoms consistent with gastroenteritis, doubt appendicitis or other emergent pathology. Will give fluids and zofran, and reassess after labs and U/A. Discussed case with my attending Dr. Adela Lank who agrees with plan.  3:23 PM CMP resulting showing K 2.4, will order EKG and PO Kdur . CBC unremarkable, no anemia or leukocytosis. U/A pending. Will give another 1L bolus of fluids since she was likely fairly dehydrated. Still nauseated after zofran, will give phenergan. BP stable in the 120s/70s. Will reassess shortly  4:51 PM EKG without concerning findings given the hypokalemia. Tolerating PO well here. BP stable. Feels okay to go home. U/A unremarkable. Will send home with zofran and phenergan, as well as additional Kdur x3 days to adequately correct her hypokalemia. Will  have her f/up with PCP on Monday to recheck lab work and recheck symptoms. BRAT diet discussed. Tylenol/motrin for pain. Adequate hydration discussed. I explained the diagnosis and have given explicit precautions to return to the ER including for any other new or worsening symptoms. The patient understands and accepts the medical plan as it's been dictated and I have answered their questions. Discharge instructions concerning home care and prescriptions have been given. The patient is STABLE and is discharged to home in good condition.  BP 131/83 mmHg  Pulse 66  Temp(Src) 98.3 F (36.8 C) (Oral)  Resp 14  SpO2 100%  Meds ordered this encounter  Medications  . sodium chloride 0.9 % bolus 1,000 mL    Sig:   . ondansetron (ZOFRAN) injection  4 mg    Sig:   . potassium chloride SA (K-DUR,KLOR-CON) CR tablet 80 mEq    Sig:   . promethazine (PHENERGAN) injection 12.5 mg    Sig:   . sodium chloride 0.9 % bolus 1,000 mL    Sig:   . ondansetron (ZOFRAN ODT) 4 MG disintegrating tablet    Sig: Take 1 tablet (4 mg total) by mouth every 8 (eight) hours as needed for nausea or vomiting.    Dispense:  15 tablet    Refill:  0    Order Specific Question:  Supervising Provider    Answer:  MILLER, BRIAN [[id number]]  . promethazine (PHENERGAN) 12.5 MG tablet    Sig: Take 1 tablet (12.5 mg total) by mouth every 6 (six) hours as needed for refractory nausea / vomiting.    Dispense:  20 tablet    Refill:  0    Order Specific Question:  Supervising Provider    Answer:  MILLER, BRIAN [[id number]]  . potassium chloride SA (K-DUR,KLOR-CON) 20 MEQ tablet    Sig: Take 1 tablet (20 mEq total) by mouth daily. x3 days    Dispense:  3 tablet    Refill:  0    Order Specific Question:  Supervising Provider    Answer:  MILLER, BRIAN [[id number]]  . nicotine (NICODERM CQ - dosed in mg/24 hours) patch 21 mg    Sig:       Shelby Peltz Camprubi-Soms, PA-C 05/16/16 1658  Melene Plan, DO 05/17/16 [phone]

## 2016-05-29 DIAGNOSIS — S76011A Strain of muscle, fascia and tendon of right hip, initial encounter: Secondary | ICD-10-CM | POA: Diagnosis not present

## 2016-06-18 ENCOUNTER — Ambulatory Visit: Payer: PPO | Attending: Adult Health

## 2016-06-23 DIAGNOSIS — Z124 Encounter for screening for malignant neoplasm of cervix: Secondary | ICD-10-CM | POA: Diagnosis not present

## 2016-06-23 DIAGNOSIS — F4321 Adjustment disorder with depressed mood: Secondary | ICD-10-CM | POA: Diagnosis not present

## 2016-06-23 DIAGNOSIS — Z681 Body mass index (BMI) 19 or less, adult: Secondary | ICD-10-CM | POA: Diagnosis not present

## 2016-06-23 DIAGNOSIS — Z1231 Encounter for screening mammogram for malignant neoplasm of breast: Secondary | ICD-10-CM | POA: Diagnosis not present

## 2016-08-24 ENCOUNTER — Ambulatory Visit: Payer: PPO | Admitting: Neurology

## 2016-08-26 ENCOUNTER — Encounter: Payer: Self-pay | Admitting: Neurology

## 2016-09-08 ENCOUNTER — Encounter: Payer: Self-pay | Admitting: Neurology

## 2016-09-08 ENCOUNTER — Ambulatory Visit (INDEPENDENT_AMBULATORY_CARE_PROVIDER_SITE_OTHER): Payer: PPO | Admitting: Neurology

## 2016-09-08 DIAGNOSIS — G621 Alcoholic polyneuropathy: Secondary | ICD-10-CM | POA: Diagnosis not present

## 2016-09-08 MED ORDER — GABAPENTIN 300 MG PO CAPS: 600.0000 mg | ORAL_CAPSULE | Freq: Three times a day (TID) | ORAL | 6 refills | Status: DC

## 2016-09-08 NOTE — Progress Notes (Signed)
[facility]    Provider:  Dr Lucia Gaskins Referring Provider: Richmond Campbell., PA-C Primary Care Physician:  Lilia Argue  CC:  Alcoholic neuropathy  Interval History: April [name] is a 70 y.o. female who looks older than stated age here as a referral from Dr. Arlyce Dice for Alcoholic neuropathy. PMHx of chronic venous insufficiency, depression, anxiety, tobacco and alcohol abuse. Patient continues to drink brandy at breakfast lunch and dinner. Neuropathy follow up for both legs. Pt is takin gabpentin for shingles last took lyrica before May 2017. Pt cannot afford the 45.00 co payment for Lyrica. She is on Amitriptyline for the pain. Brandy at least 2 bottles a week or more for . She drinks it throughout the day. Pain is worsening in the feet.   HPI:  April [name] is a 70 y.o. female who looks older than stated age here as a referral from Dr. Arlyce Dice for neuropathy. PMHx of chronic venous insufficiency, depression, anxiety, tobacco and alcohol abuse. Numbness in the feet started a year ago. She had shingles and her legs started swelling with the neurontin. She has had swelling in the legs since then. The feel feet tight. The swelling has improved. She has tightness from the ankles to her knees. She has numbness, tingling in the feet. She has problems with balance due to the sensation in the feet. The pain can be 7/10 in pain. Continuous pain. She is not diabetic. She tried Lyrica 150mg  twice daily for a week then stopped. Progressively worsening. Denies diabetes. She drinks brandy every day Brandy, at least 2 bottles a week, She smokes a pack of cigarrettes a day. She only treid Lyrica for a week. She denies cramping in her feet. She has weakness in the legs.  She is here with her daughter who also provides information.   Reviewed notes, labs and imaging from outside physicians, which showed:  Notes from [facility]: April [name] is a 70 y.o.  (Oct 31, 1946) female who presents with chief complaint: bilateral ankle numbness. Patient unable to determine exactly when it began but she think it may have started when she had an episode of shingles in the right leg during this year. Patient notes, onset of swelling after she started taking medications for the right leg shingles. The patient's symptoms include: intermittent paraesthesia and anesthesia in both heels. No obvious trigger or allievators. . The patient has had no history of DVT, no history of pregnancy, known history of varicose vein, no history of venous stasis ulcers, no history of Lymphedema and known history of skin changes in lower legs. There is no family history of venous disorders. The patient has not routinely used compression stockings in the past.   RIGHT LOWER EXTREMITY  Common Femoral Vein: No evidence of thrombus. Normal compressibility, respiratory phasicity and response to augmentation.  Saphenofemoral Junction: No evidence of thrombus. Normal compressibility and flow on color Doppler imaging.  Profunda Femoral Vein: No evidence of thrombus. Normal compressibility and flow on color Doppler imaging.  Femoral Vein: No evidence of thrombus. Normal compressibility, respiratory phasicity and response to augmentation.  Popliteal Vein: No evidence of thrombus. Normal compressibility, respiratory phasicity and response to augmentation.  Calf Veins: No evidence of thrombus. Normal compressibility and flow on color Doppler imaging.  Superficial Great Saphenous Vein: No evidence of thrombus. Normal compressibility and flow on color Doppler imaging.  Venous Reflux: None.  Other Findings: None.  LEFT LOWER EXTREMITY  Common Femoral Vein: No evidence of thrombus. Normal  compressibility, respiratory phasicity and response to augmentation.  Saphenofemoral Junction: No evidence of thrombus. Normal compressibility and flow on color Doppler  imaging.  Profunda Femoral Vein: No evidence of thrombus. Normal compressibility and flow on color Doppler imaging.  Femoral Vein: No evidence of thrombus. Normal compressibility,respiratory phasicity and response to augmentation.  Popliteal Vein: No evidence of thrombus. Normal compressibility,respiratory phasicity and response to augmentation.  Calf Veins: No evidence of thrombus. Normal compressibility and flow on color Doppler imaging.  Superficial Great Saphenous Vein: No evidence of thrombus. Normal compressibility and flow on color Doppler imaging.  Venous Reflux: None.  Other Findings: None.  IMPRESSION: No evidence of deep venous thrombosis  Review of Systems: Patient complains of symptoms per HPI as well as the following symptoms: swelling in the legs, aching muscles, numbness in feet. Pertinent negatives per HPI. All others negative.    Social History   Social History  . Marital status: Widowed    Spouse name: N/A  . Number of children: 1  . Years of education: 7816   Occupational History  . Not on file.   Social History Main Topics  . Smoking status: Current Every Day Smoker    Packs/day: 1.00    Types: Cigarettes  . Smokeless tobacco: Never Used  . Alcohol use 0.6 oz/week    1 Shots of liquor per week     Comment: drinks brandy in the am and lunch,one in the evening  . Drug use: No  . Sexual activity: Not on file   Other Topics Concern  . Not on file   Social History Narrative   Lives at home with daughter April [name]   Caffeine use: Drinks coffee 1/day    Family History  Problem Relation Age of Onset  . Heart disease Father   . Neuropathy Neg Hx     Past Medical History:  Diagnosis Date  . Anxiety   . Depression   . History of shingles summer 2015  . Hyperlipidemia   . Hypertension   . Leg edema   . Neuropathy [facility](HCC)     Past Surgical History:  Procedure Laterality Date  . removal of uterine fibroids  2004    Current  Outpatient Prescriptions  Medication Sig Dispense Refill  . amitriptyline (ELAVIL) 25 MG tablet Take 25 mg by mouth at bedtime.  0  . clonazePAM (KLONOPIN) 0.5 MG tablet Take 0.5 mg by mouth 2 (two) times daily as needed for anxiety.    Marland Kitchen. escitalopram (LEXAPRO) 10 MG tablet     . gabapentin (NEURONTIN) 300 MG capsule Take 300 mg by mouth 3 (three) times daily.    . potassium chloride SA (K-DUR,KLOR-CON) 20 MEQ tablet Take 1 tablet (20 mEq total) by mouth daily. x3 days 3 tablet 0  . Vitamin D, Ergocalciferol, (DRISDOL) 50000 units CAPS capsule Take 50,000 Units by mouth once a week.  1   No current facility-administered medications for this visit.     Allergies as of 09/08/2016 - Review Complete 09/08/2016  Allergen Reaction Noted  . Naltrexone Shortness Of Breath 04/20/2016  . Penicillins Rash 08/16/2015    Vitals: BP 126/90 (BP Location: Right Arm, Patient Position: Sitting, Cuff Size: Small)   Pulse 96   Ht 5\' 7"  (1.702 m)   Wt 122 lb (55.3 kg)   BMI 19.11 kg/m  Last Weight:  Wt Readings from Last 1 Encounters:  09/08/16 122 lb (55.3 kg)   Last Height:   Ht Readings from Last 1 Encounters:  09/08/16  5\' 7"  (1.702 m)    Neuro: Detailed Neurologic Exam  Speech:    Speech is normal; fluent and spontaneous with normal comprehension.  Cognition:    The patient is oriented to person, place, and time;     recent and remote memory intact;     language fluent;     normal attention, concentration,     fund of knowledge Cranial Nerves:    The pupils are equal, round, and reactive to light. The fundi are flat.  Visual fields are full to finger confrontation. Extraocular movements are intact. Trigeminal sensation is intact and the muscles of mastication are normal. The face is symmetric. The palate elevates in the midline. Hearing intact. Voice is normal. Shoulder shrug is normal. The tongue has normal motion without fasciculations.   Coordination:    Normal finger to nose and  heel to shin.   Gait:   Not ataxic  Motor Observation:    No asymmetry, no atrophy, and no involuntary movements noted. Tone:    Normal muscle tone.    Posture:    Posture is normal. normal erect    Strength:    Strength is V/V in the upper and lower limbs.      Sensation: decr to pp and temp in glove and stocking distribution.      Reflex Exam:  DTR's: Absent AJs. Otherwise deep tendon reflexes in the upper and lower extremities are brisk bilaterally.   Toes:    The toes are downgoing bilaterally.   Clonus:    Clonus is absent.       Assessment/Plan:   70 y.o. female who looks older than stated age here as a referral from Dr. Arlyce Dice for neuropathy. PMHx of chronic venous insufficiency, depression, anxiety, tobacco and alcohol abuse. Will perform a thorough serum neuropathy panel. Diagnosis distal symmetric peripheral polyneuropathy from alcohol abuse. She has dec pp and temp in a glove-and-stocking distribution.  Had a long discussion with daughter and mother, both resistant to the idea that alcohol can cause neuropathy and stating that alcohol helps with mother's depression and other problems. Highly encouraged meeting with primary care to make a plan to decrease alcohol use as well as smoking cessation. She can't afford Lyrica but drinks Brandy all day.   Had a frank discussion with patient about alcoholic neuropathy. No other cause found for her neuropathy. Patient noncompliant with request to see primary care to discuss a plan to decrease alcohol. There is nothing more I can do for patient if she is resistant to trying to decrease her alcohol. Work up complete. Will refer her back to primary care for management of pain. Her folate was abnormal I recommended daily folic acid supplementation. B12 was WNL. Primary care can titrate neurontin up to 1200mg  three times a day and then if this does not work they can refer to pain clinic. Do not stop abruptly as this can cause  death. Recommend daily multivitamin and calcium with vitamin D.   CC: KAPLAN,KRISTEN, PA-C and Dr. Margit Hanks, MD  [facility] [address] [place], [place] [address]  Phone [phone] Fax [phone]  A total of 30 minutes was spent face-to-face with this patient. Over half this time was spent on counseling patient on the alcoholic neuropathy diagnosis and different diagnostic and therapeutic options available.

## 2016-09-08 NOTE — Patient Instructions (Addendum)
Remember to drink plenty of fluid, eat healthy meals and do not skip any meals. Try to eat protein with a every meal and eat a healthy snack such as fruit or nuts in between meals. Try to keep a regular sleep-wake schedule and try to exercise daily, particularly in the form of walking, 20-30 minutes a day, if you can.   As far as your medications are concerned, I would like to suggest:  Increase Neurontin slowly.   Days 1-3: one pill twice a day and two at night Days 4-7: One pill in the morning, 2 pills in the afternoon and 2 pills at night Then 2 pills three times a day  Watch for sedation  Our phone number is [phone]. We also have an after hours call service for urgent matters and there is a physician on-call for urgent questions. For any emergencies you know to call 911 or go to the nearest emergency room  Gabapentin capsules or tablets What is this medicine? GABAPENTIN (GA ba pen tin) is used to control partial seizures in adults with epilepsy. It is also used to treat certain types of nerve pain. This medicine may be used for other purposes; ask your health care provider or pharmacist if you have questions. What should I tell my health care provider before I take this medicine? They need to know if you have any of these conditions: -kidney disease -suicidal thoughts, plans, or attempt; a previous suicide attempt by you or a family member -an unusual or allergic reaction to gabapentin, other medicines, foods, dyes, or preservatives -pregnant or trying to get pregnant -breast-feeding How should I use this medicine? Take this medicine by mouth with a glass of water. Follow the directions on the prescription label. You can take it with or without food. If it upsets your stomach, take it with food.Take your medicine at regular intervals. Do not take it more often than directed. Do not stop taking except on your doctor's advice. If you are directed to break the 600 or 800 mg tablets  in half as part of your dose, the extra half tablet should be used for the next dose. If you have not used the extra half tablet within 28 days, it should be thrown away. A special MedGuide will be given to you by the pharmacist with each prescription and refill. Be sure to read this information carefully each time. Talk to your pediatrician regarding the use of this medicine in children. Special care may be needed. Overdosage: If you think you have taken too much of this medicine contact a poison control center or emergency room at once. NOTE: This medicine is only for you. Do not share this medicine with others. What if I miss a dose? If you miss a dose, take it as soon as you can. If it is almost time for your next dose, take only that dose. Do not take double or extra doses. What may interact with this medicine? Do not take this medicine with any of the following medications: -other gabapentin products This medicine may also interact with the following medications: -alcohol -antacids -antihistamines for allergy, cough and cold -certain medicines for anxiety or sleep -certain medicines for depression or psychotic disturbances -homatropine; hydrocodone -naproxen -narcotic medicines (opiates) for pain -phenothiazines like chlorpromazine, mesoridazine, prochlorperazine, thioridazine This list may not describe all possible interactions. Give your health care provider a list of all the medicines, herbs, non-prescription drugs, or dietary supplements you use. Also tell them if you smoke, drink  alcohol, or use illegal drugs. Some items may interact with your medicine. What should I watch for while using this medicine? Visit your doctor or health care professional for regular checks on your progress. You may want to keep a record at home of how you feel your condition is responding to treatment. You may want to share this information with your doctor or health care professional at each visit. You  should contact your doctor or health care professional if your seizures get worse or if you have any new types of seizures. Do not stop taking this medicine or any of your seizure medicines unless instructed by your doctor or health care professional. Stopping your medicine suddenly can increase your seizures or their severity. Wear a medical identification bracelet or chain if you are taking this medicine for seizures, and carry a card that lists all your medications. You may get drowsy, dizzy, or have blurred vision. Do not drive, use machinery, or do anything that needs mental alertness until you know how this medicine affects you. To reduce dizzy or fainting spells, do not sit or stand up quickly, especially if you are an older patient. Alcohol can increase drowsiness and dizziness. Avoid alcoholic drinks. Your mouth may get dry. Chewing sugarless gum or sucking hard candy, and drinking plenty of water will help. The use of this medicine may increase the chance of suicidal thoughts or actions. Pay special attention to how you are responding while on this medicine. Any worsening of mood, or thoughts of suicide or dying should be reported to your health care professional right away. Women who become pregnant while using this medicine may enroll in the [place] Antiepileptic Drug Pregnancy Registry by calling [phone]. This registry collects information about the safety of antiepileptic drug use during pregnancy. What side effects may I notice from receiving this medicine? Side effects that you should report to your doctor or health care professional as soon as possible: -allergic reactions like skin rash, itching or hives, swelling of the face, lips, or tongue -worsening of mood, thoughts or actions of suicide or dying Side effects that usually do not require medical attention (report to your doctor or health care professional if they continue or are bothersome): -constipation -difficulty  walking or controlling muscle movements -dizziness -nausea -slurred speech -tiredness -tremors -weight gain This list may not describe all possible side effects. Call your doctor for medical advice about side effects. You may report side effects to FDA at 1-800-FDA-1088. Where should I keep my medicine? Keep out of reach of children. This medicine may cause accidental overdose and death if it taken by other adults, children, or pets. Mix any unused medicine with a substance like cat litter or coffee grounds. Then throw the medicine away in a sealed container like a sealed bag or a coffee can with a lid. Do not use the medicine after the expiration date. Store at room temperature between 15 and 30 degrees C (59 and 86 degrees F). NOTE: This sheet is a summary. It may not cover all possible information. If you have questions about this medicine, talk to your doctor, pharmacist, or health care provider.    2016, [employer]vier/Gold Standard. (2014-01-26 15:26:50)

## 2016-10-28 ENCOUNTER — Other Ambulatory Visit: Payer: Self-pay | Admitting: *Deleted

## 2016-10-29 ENCOUNTER — Other Ambulatory Visit: Payer: Self-pay | Admitting: *Deleted

## 2016-10-29 MED ORDER — GABAPENTIN 300 MG PO CAPS: 600.0000 mg | ORAL_CAPSULE | Freq: Three times a day (TID) | ORAL | 1 refills | Status: DC

## 2017-01-29 ENCOUNTER — Encounter (HOSPITAL_COMMUNITY): Payer: Self-pay | Admitting: Emergency Medicine

## 2017-01-29 ENCOUNTER — Inpatient Hospital Stay (HOSPITAL_COMMUNITY)
Admission: EM | Admit: 2017-01-29 | Discharge: 2017-02-06 | DRG: 417 | Disposition: A | Discharge status: Home Health | Payer: PPO | Attending: Family Medicine | Admitting: Family Medicine

## 2017-01-29 ENCOUNTER — Emergency Department (HOSPITAL_COMMUNITY): Payer: PPO

## 2017-01-29 DIAGNOSIS — W19XXXA Unspecified fall, initial encounter: Secondary | ICD-10-CM | POA: Diagnosis not present

## 2017-01-29 DIAGNOSIS — Z66 Do not resuscitate: Secondary | ICD-10-CM | POA: Diagnosis not present

## 2017-01-29 DIAGNOSIS — Z79899 Other long term (current) drug therapy: Secondary | ICD-10-CM

## 2017-01-29 DIAGNOSIS — E876 Hypokalemia: Secondary | ICD-10-CM

## 2017-01-29 DIAGNOSIS — K839 Disease of biliary tract, unspecified: Secondary | ICD-10-CM | POA: Diagnosis not present

## 2017-01-29 DIAGNOSIS — R7309 Other abnormal glucose: Secondary | ICD-10-CM

## 2017-01-29 DIAGNOSIS — E278 Other specified disorders of adrenal gland: Secondary | ICD-10-CM | POA: Diagnosis present

## 2017-01-29 DIAGNOSIS — F17210 Nicotine dependence, cigarettes, uncomplicated: Secondary | ICD-10-CM | POA: Diagnosis not present

## 2017-01-29 DIAGNOSIS — E872 Acidosis, unspecified: Secondary | ICD-10-CM | POA: Diagnosis present

## 2017-01-29 DIAGNOSIS — R109 Unspecified abdominal pain: Secondary | ICD-10-CM

## 2017-01-29 DIAGNOSIS — K8065 Calculus of gallbladder and bile duct with chronic cholecystitis with obstruction: Principal | ICD-10-CM | POA: Diagnosis present

## 2017-01-29 DIAGNOSIS — Z6821 Body mass index (BMI) 21.0-21.9, adult: Secondary | ICD-10-CM | POA: Diagnosis not present

## 2017-01-29 DIAGNOSIS — K2970 Gastritis, unspecified, without bleeding: Secondary | ICD-10-CM | POA: Diagnosis not present

## 2017-01-29 DIAGNOSIS — Z8619 Personal history of other infectious and parasitic diseases: Secondary | ICD-10-CM

## 2017-01-29 DIAGNOSIS — E43 Unspecified severe protein-calorie malnutrition: Secondary | ICD-10-CM | POA: Insufficient documentation

## 2017-01-29 DIAGNOSIS — I872 Venous insufficiency (chronic) (peripheral): Secondary | ICD-10-CM | POA: Diagnosis not present

## 2017-01-29 DIAGNOSIS — R112 Nausea with vomiting, unspecified: Secondary | ICD-10-CM | POA: Diagnosis not present

## 2017-01-29 DIAGNOSIS — R5082 Postprocedural fever: Secondary | ICD-10-CM

## 2017-01-29 DIAGNOSIS — K8050 Calculus of bile duct without cholangitis or cholecystitis without obstruction: Secondary | ICD-10-CM

## 2017-01-29 DIAGNOSIS — E785 Hyperlipidemia, unspecified: Secondary | ICD-10-CM | POA: Diagnosis present

## 2017-01-29 DIAGNOSIS — K8020 Calculus of gallbladder without cholecystitis without obstruction: Secondary | ICD-10-CM

## 2017-01-29 DIAGNOSIS — Z888 Allergy status to other drugs, medicaments and biological substances status: Secondary | ICD-10-CM

## 2017-01-29 DIAGNOSIS — K851 Biliary acute pancreatitis without necrosis or infection: Secondary | ICD-10-CM

## 2017-01-29 DIAGNOSIS — R739 Hyperglycemia, unspecified: Secondary | ICD-10-CM | POA: Diagnosis present

## 2017-01-29 DIAGNOSIS — F1020 Alcohol dependence, uncomplicated: Secondary | ICD-10-CM | POA: Diagnosis present

## 2017-01-29 DIAGNOSIS — K859 Acute pancreatitis without necrosis or infection, unspecified: Secondary | ICD-10-CM | POA: Diagnosis not present

## 2017-01-29 DIAGNOSIS — N390 Urinary tract infection, site not specified: Secondary | ICD-10-CM | POA: Diagnosis not present

## 2017-01-29 DIAGNOSIS — F419 Anxiety disorder, unspecified: Secondary | ICD-10-CM | POA: Diagnosis not present

## 2017-01-29 DIAGNOSIS — R51 Headache: Secondary | ICD-10-CM | POA: Diagnosis not present

## 2017-01-29 DIAGNOSIS — I10 Essential (primary) hypertension: Secondary | ICD-10-CM | POA: Diagnosis present

## 2017-01-29 DIAGNOSIS — E8729 Other acidosis: Secondary | ICD-10-CM

## 2017-01-29 DIAGNOSIS — R0902 Hypoxemia: Secondary | ICD-10-CM

## 2017-01-29 DIAGNOSIS — J9811 Atelectasis: Secondary | ICD-10-CM | POA: Diagnosis not present

## 2017-01-29 DIAGNOSIS — K8045 Calculus of bile duct with chronic cholecystitis with obstruction: Secondary | ICD-10-CM | POA: Diagnosis present

## 2017-01-29 DIAGNOSIS — Z880 Allergy status to penicillin: Secondary | ICD-10-CM

## 2017-01-29 DIAGNOSIS — G629 Polyneuropathy, unspecified: Secondary | ICD-10-CM | POA: Diagnosis present

## 2017-01-29 DIAGNOSIS — R740 Nonspecific elevation of levels of transaminase and lactic acid dehydrogenase [LDH]: Secondary | ICD-10-CM

## 2017-01-29 DIAGNOSIS — F329 Major depressive disorder, single episode, unspecified: Secondary | ICD-10-CM | POA: Diagnosis present

## 2017-01-29 DIAGNOSIS — F1010 Alcohol abuse, uncomplicated: Secondary | ICD-10-CM | POA: Diagnosis not present

## 2017-01-29 DIAGNOSIS — D539 Nutritional anemia, unspecified: Secondary | ICD-10-CM

## 2017-01-29 DIAGNOSIS — R7401 Elevation of levels of liver transaminase levels: Secondary | ICD-10-CM

## 2017-01-29 HISTORY — DX: Nicotine dependence, unspecified, uncomplicated: F17.200

## 2017-01-29 HISTORY — DX: Alcohol abuse, uncomplicated: F10.10

## 2017-01-29 HISTORY — DX: Gastro-esophageal reflux disease without esophagitis: K21.9

## 2017-01-29 HISTORY — DX: Essential (primary) hypertension: I10

## 2017-01-29 LAB — CBC
HCT: 32.1 % — ABNORMAL LOW (ref 36.0–46.0)
HEMOGLOBIN: 10.6 g/dL — AB (ref 12.0–15.0)
MCH: 35.9 pg — ABNORMAL HIGH (ref 26.0–34.0)
MCHC: 33 g/dL (ref 30.0–36.0)
MCV: 108.8 fL — ABNORMAL HIGH (ref 78.0–100.0)
Platelets: 206 10*3/uL (ref 150–400)
RBC: 2.95 MIL/uL — AB (ref 3.87–5.11)
RDW: 18.1 % — ABNORMAL HIGH (ref 11.5–15.5)
WBC: 9 10*3/uL (ref 4.0–10.5)

## 2017-01-29 LAB — URINALYSIS, ROUTINE W REFLEX MICROSCOPIC
BILIRUBIN URINE: NEGATIVE
GLUCOSE, UA: NEGATIVE mg/dL
KETONES UR: 80 mg/dL — AB
LEUKOCYTES UA: NEGATIVE
NITRITE: POSITIVE — AB
PROTEIN: 100 mg/dL — AB
Specific Gravity, Urine: 1.017 (ref 1.005–1.030)
pH: 6 (ref 5.0–8.0)

## 2017-01-29 LAB — COMPREHENSIVE METABOLIC PANEL
ALBUMIN: 4.4 g/dL (ref 3.5–5.0)
ALT: 30 U/L (ref 14–54)
ANION GAP: 18 — AB (ref 5–15)
AST: 85 U/L — ABNORMAL HIGH (ref 15–41)
Alkaline Phosphatase: 103 U/L (ref 38–126)
BILIRUBIN TOTAL: 3.1 mg/dL — AB (ref 0.3–1.2)
BUN: 17 mg/dL (ref 6–20)
CALCIUM: 9.8 mg/dL (ref 8.9–10.3)
CO2: 16 mmol/L — ABNORMAL LOW (ref 22–32)
Chloride: 105 mmol/L (ref 101–111)
Creatinine, Ser: 0.85 mg/dL (ref 0.44–1.00)
GFR calc non Af Amer: >60 mL/min (ref >60)
GLUCOSE: 201 mg/dL — AB (ref 65–99)
Potassium: 3.3 mmol/L — ABNORMAL LOW (ref 3.5–5.1)
SODIUM: 139 mmol/L (ref 135–145)
TOTAL PROTEIN: 7.2 g/dL (ref 6.5–8.1)

## 2017-01-29 LAB — LIPASE, BLOOD: Lipase: 1692 U/L — ABNORMAL HIGH (ref 11–51)

## 2017-01-29 MED ORDER — MORPHINE SULFATE (PF) 4 MG/ML IV SOLN
4.0000 mg | Freq: Once | INTRAVENOUS | Status: AC
  Administered 2017-01-29: 4 mg via INTRAVENOUS
  Filled 2017-01-29: qty 1

## 2017-01-29 MED ORDER — SODIUM CHLORIDE 0.9 % IV SOLN
30.0000 meq | Freq: Once | INTRAVENOUS | Status: AC
  Administered 2017-01-30: 30 meq via INTRAVENOUS
  Filled 2017-01-29: qty 15

## 2017-01-29 MED ORDER — ONDANSETRON HCL 4 MG/2ML IJ SOLN
4.0000 mg | Freq: Once | INTRAMUSCULAR | Status: AC
Start: 2017-01-30 — End: 2017-01-29
  Administered 2017-01-29: 4 mg via INTRAVENOUS
  Filled 2017-01-29: qty 2

## 2017-01-29 MED ORDER — NICOTINE 14 MG/24HR TD PT24
14.0000 mg | MEDICATED_PATCH | Freq: Once | TRANSDERMAL | Status: AC
  Administered 2017-01-30: 14 mg via TRANSDERMAL
  Filled 2017-01-29: qty 1

## 2017-01-29 MED ORDER — IOPAMIDOL (ISOVUE-300) INJECTION 61%
100.0000 mL | Freq: Once | INTRAVENOUS | Status: AC | PRN
  Administered 2017-01-30: 100 mL via INTRAVENOUS

## 2017-01-29 MED ORDER — SODIUM CHLORIDE 0.9 % IV BOLUS (SEPSIS)
1000.0000 mL | Freq: Once | INTRAVENOUS | Status: AC
  Administered 2017-01-29: 1000 mL via INTRAVENOUS

## 2017-01-29 NOTE — ED Notes (Signed)
PA made aware of elevated Lipase

## 2017-01-29 NOTE — ED Notes (Signed)
Bed: WA17 Expected date:  Expected time:  Means of arrival:  Comments: N, v, d for 4 days

## 2017-01-29 NOTE — ED Triage Notes (Signed)
Pt BIB EMS from home for N/V/D for 4 hours; pt fell when walking to the bathroom this AM and hit head; no LOC; no evidence of trauma; pt also fell yesterday

## 2017-01-29 NOTE — ED Notes (Signed)
MD at bedside. 

## 2017-01-29 NOTE — ED Provider Notes (Signed)
[facility]-EMERGENCY DEPT Provider Note   CSN: [id number] Arrival date & time: 01/29/17  2037  By signing my name below, I, Linna Darner, attest that this documentation has been prepared under the direction and in the presence of physician practitioner, Dione Booze, MD. Electronically Signed: Linna Darner, Scribe. 01/29/2017. 11:37 PM.  History   Chief Complaint Chief Complaint  Patient presents with  . N/V/D  . Fall    The history is provided by the patient and a relative. No language interpreter was used.     HPI Comments: April [name] is a 71 y.o. female with PMHx including HTN, HLD, and alcohol abuse who presents to the Emergency Department via EMS complaining of constant, 8/10, gradually worsening, generalized abdominal pain R > L beginning 4 days ago. Pt states the pain radiates into her back. She reports associated nausea and vomiting and states she has been regurgitating food, fluids, and medications. No modifying factors noted, and she specifically reports that her abdominal pain is not improved after vomiting. She reports several episodes of diarrhea since onset of her abdominal pain and notes the stools have been black and tarry. She has tried [employer] for her symptoms but has regurgitated it. Pt also notes that she fell earlier today and struck her head with no associated loss of consciousness, wounds, or bruising; she has had a headache since the fall. Pt denies recent alcohol use; pt's daughter states patient has not had any alcohol today but regularly "sips" on brandy throughout the day. Pt smokes ~ 1 ppd. She denies fever, chills, diaphoresis, or any other associated symptoms.  Past Medical History:  Diagnosis Date  . Anxiety   . Depression   . History of shingles summer 2015  . Hyperlipidemia   . Hypertension   . Leg edema   . Neuropathy [facility])     Patient Active Problem List   Diagnosis Date Noted  . Neuropathy, alcoholic (HCC) 09/08/2016  . Neuropathy (HCC)  12/11/2015  . Alcohol abuse 12/11/2015  . Tobacco abuse 12/11/2015  . Chronic venous insufficiency 09/13/2015  . Extremity numbness 09/13/2015    Past Surgical History:  Procedure Laterality Date  . removal of uterine fibroids  2004    OB History    No data available       Home Medications    Prior to Admission medications   Medication Sig Start Date End Date Taking? Authorizing Provider  amitriptyline (ELAVIL) 25 MG tablet Take 25 mg by mouth at bedtime as needed for sleep.  08/27/16  Yes Historical Provider, MD  Aspirin-Acetaminophen-Caffeine (GOODY HEADACHE PO) Take 1 each by mouth daily as needed (headache).   Yes Historical Provider, MD  clonazePAM (KLONOPIN) 0.5 MG tablet Take 0.5 mg by mouth 2 (two) times daily as needed for anxiety.   Yes Historical Provider, MD  gabapentin (NEURONTIN) 300 MG capsule Take 2 capsules (600 mg total) by mouth 3 (three) times daily. 10/29/16  Yes Anson Fret, MD  Vitamin D, Ergocalciferol, (DRISDOL) 50000 units CAPS capsule Take 50,000 Units by mouth once a week. 07/31/16  Yes Historical Provider, MD    Family History Family History  Problem Relation Age of Onset  . Heart disease Father   . Neuropathy Neg Hx     Social History Social History  Substance Use Topics  . Smoking status: Current Every Day Smoker    Packs/day: 1.00    Types: Cigarettes  . Smokeless tobacco: Never Used  . Alcohol use 0.6 oz/week  1 Shots of liquor per week     Comment: drinks brandy midafternoon and evening     Allergies   Naltrexone and Penicillins   Review of Systems Review of Systems  Constitutional: Negative for chills, diaphoresis and fever.  Gastrointestinal: Positive for abdominal pain, diarrhea, nausea and vomiting.  Musculoskeletal: Positive for back pain.  Skin: Negative for color change and wound.  Neurological: Positive for headaches. Negative for syncope.  All other systems reviewed and are negative.    Physical  Exam Updated Vital Signs BP 140/80 (BP Location: Left Arm)   Pulse 96   Resp 17   Ht 5\' 6"  (1.676 m)   Wt 125 lb (56.7 kg)   SpO2 97%   BMI 20.18 kg/m   Physical Exam  Constitutional: She is oriented to person, place, and time. She appears well-developed and well-nourished.  HENT:  Head: Normocephalic and atraumatic.  Eyes: EOM are normal. Pupils are equal, round, and reactive to light.  Neck: Normal range of motion. Neck supple. No JVD present.  Cardiovascular: Normal rate, regular rhythm and normal heart sounds.   No murmur heard. Pulmonary/Chest: Effort normal and breath sounds normal. She has no wheezes. She has no rales. She exhibits no tenderness.  Abdominal: Soft. Bowel sounds are normal. She exhibits no distension and no mass. There is tenderness.  Mild tenderness diffusely.  Musculoskeletal: Normal range of motion. She exhibits no edema.  Lymphadenopathy:    She has no cervical adenopathy.  Neurological: She is alert and oriented to person, place, and time. No cranial nerve deficit. She exhibits normal muscle tone. Coordination normal.  Skin: Skin is warm and dry. No rash noted.  Psychiatric: She has a normal mood and affect. Her behavior is normal. Judgment and thought content normal.  Nursing note and vitals reviewed.    ED Treatments / Results  Labs (all labs ordered are listed, but only abnormal results are displayed) Labs Reviewed  LIPASE, BLOOD - Abnormal; Notable for the following:       Result Value   Lipase 1,692 (*)    All other components within normal limits  COMPREHENSIVE METABOLIC PANEL - Abnormal; Notable for the following:    Potassium 3.3 (*)    CO2 16 (*)    Glucose, Bld 201 (*)    AST 85 (*)    Total Bilirubin 3.1 (*)    Anion gap 18 (*)    All other components within normal limits  CBC - Abnormal; Notable for the following:    RBC 2.95 (*)    Hemoglobin 10.6 (*)    HCT 32.1 (*)    MCV 108.8 (*)    MCH 35.9 (*)    RDW 18.1 (*)    All  other components within normal limits  URINALYSIS, ROUTINE W REFLEX MICROSCOPIC - Abnormal; Notable for the following:    Color, Urine AMBER (*)    APPearance HAZY (*)    Hgb urine dipstick LARGE (*)    Ketones, ur 80 (*)    Protein, ur 100 (*)    Nitrite POSITIVE (*)    Bacteria, UA MANY (*)    Squamous Epithelial / LPF 0-5 (*)    All other components within normal limits  PROTIME-INR    Radiology Ct Head Wo Contrast  Result Date: 01/30/2017 CLINICAL DATA:  Initial evaluation for acute trauma, fall. EXAM: CT HEAD WITHOUT CONTRAST CT CERVICAL SPINE WITHOUT CONTRAST TECHNIQUE: Multidetector CT imaging of the head and cervical spine was performed following the  standard protocol without intravenous contrast. Multiplanar CT image reconstructions of the cervical spine were also generated. COMPARISON:  Prior CT from 02/24/2016. FINDINGS: CT HEAD FINDINGS Brain: Generalized age-related cerebral atrophy with mild to moderate chronic microvascular ischemic disease. No acute intracranial hemorrhage. No evidence for acute large vessel territory infarct. No mass lesion, midline shift or mass effect. No hydrocephalus. No extra-axial fluid collection. Vascular: No hyperdense vessel. Scattered vascular calcifications noted within the carotid siphons. Skull: Scalp soft tissues demonstrate no acute abnormality. Calvarium intact. Sinuses/Orbits: Globes and orbital soft tissues within normal limits. Small fluid level noted within the left maxillary sinus. Visualized paranasal sinuses are otherwise largely clear. No mastoid effusion. Degenerative changes noted about the temporomandibular joints bilaterally. CT CERVICAL SPINE FINDINGS Alignment: Straightening of the normal cervical lordosis. No listhesis. Skull base and vertebrae: The skullbase intact. Normal C1-2 articulations are preserved. Dens intact. Vertebral body heights are maintained. The bone no acute fracture. Soft tissues and spinal canal: The visualized  soft tissues of the neck demonstrate no acute abnormality. No prevertebral edema. Prominent vascular calcifications present about the carotid bifurcations. Disc levels: Moderate degenerative spondylolysis present at C5-6 and C6-7. The the the prominent left-sided facet arthrosis at C3-4. Upper chest: Visualized upper mediastinum unremarkable. Emphysematous changes noted within the visualized lung apices. IMPRESSION: CT BRAIN: 1. No acute intracranial process identified. 2. Generalized age-related cerebral atrophy with mild chronic small vessel ischemic disease. 3. Small fluid level within the left maxillary sinus, suggesting acute sinusitis. CT CERVICAL SPINE: 1. No acute traumatic injury within the cervical spine. 2. Moderate degenerative spondylolysis at C5-6 and C6-7. 3. Emphysema. Electronically Signed   By: Rise Mu M.D.   On: 01/30/2017 00:52   Ct Cervical Spine Wo Contrast  Result Date: 01/30/2017 CLINICAL DATA:  Initial evaluation for acute trauma, fall. EXAM: CT HEAD WITHOUT CONTRAST CT CERVICAL SPINE WITHOUT CONTRAST TECHNIQUE: Multidetector CT imaging of the head and cervical spine was performed following the standard protocol without intravenous contrast. Multiplanar CT image reconstructions of the cervical spine were also generated. COMPARISON:  Prior CT from 02/24/2016. FINDINGS: CT HEAD FINDINGS Brain: Generalized age-related cerebral atrophy with mild to moderate chronic microvascular ischemic disease. No acute intracranial hemorrhage. No evidence for acute large vessel territory infarct. No mass lesion, midline shift or mass effect. No hydrocephalus. No extra-axial fluid collection. Vascular: No hyperdense vessel. Scattered vascular calcifications noted within the carotid siphons. Skull: Scalp soft tissues demonstrate no acute abnormality. Calvarium intact. Sinuses/Orbits: Globes and orbital soft tissues within normal limits. Small fluid level noted within the left maxillary sinus.  Visualized paranasal sinuses are otherwise largely clear. No mastoid effusion. Degenerative changes noted about the temporomandibular joints bilaterally. CT CERVICAL SPINE FINDINGS Alignment: Straightening of the normal cervical lordosis. No listhesis. Skull base and vertebrae: The skullbase intact. Normal C1-2 articulations are preserved. Dens intact. Vertebral body heights are maintained. The bone no acute fracture. Soft tissues and spinal canal: The visualized soft tissues of the neck demonstrate no acute abnormality. No prevertebral edema. Prominent vascular calcifications present about the carotid bifurcations. Disc levels: Moderate degenerative spondylolysis present at C5-6 and C6-7. The the the prominent left-sided facet arthrosis at C3-4. Upper chest: Visualized upper mediastinum unremarkable. Emphysematous changes noted within the visualized lung apices. IMPRESSION: CT BRAIN: 1. No acute intracranial process identified. 2. Generalized age-related cerebral atrophy with mild chronic small vessel ischemic disease. 3. Small fluid level within the left maxillary sinus, suggesting acute sinusitis. CT CERVICAL SPINE: 1. No acute traumatic injury within the cervical spine.  2. Moderate degenerative spondylolysis at C5-6 and C6-7. 3. Emphysema. Electronically Signed   By: Rise Mu M.D.   On: 01/30/2017 00:52   Ct Abdomen Pelvis W Contrast  Result Date: 01/30/2017 CLINICAL DATA:  Initial evaluation for acute nausea, vomiting, diarrhea. EXAM: CT ABDOMEN AND PELVIS WITH CONTRAST TECHNIQUE: Multidetector CT imaging of the abdomen and pelvis was performed using the standard protocol following bolus administration of intravenous contrast. CONTRAST:  ISOVUE-300 IOPAMIDOL (ISOVUE-300) INJECTION 61% COMPARISON:  None. FINDINGS: Lower chest: Scattered atelectatic changes present within the visualized lung bases. Visualized lungs are otherwise clear. Hepatobiliary: Diffuse hypoattenuation liver consistent  with steatosis. Liver otherwise unremarkable. Gallbladder largely contracted. Calcification within the gallbladder with likely reflects a stone. There is mild mucosal enhancement about the gallbladder itself. Common bile duct is dilated up to 14 mm. There is question of possible intraluminal density within the distal common bile duct (series 2, image 34), which may reflect choledocholithiasis. Pancreas: Hazy inflammatory stranding about the pancreas, most prevalent about the pancreatic head and uncinate process, with additional stranding near the pancreatic tail. Findings suggestive of possible acute pancreatitis. This may be related to gallstones given the findings within the gallbladder and common bile duct. No loculated fluid collection. No findings to suggest pancreatic necrosis. Spleen: Spleen within normal limits. Adrenals/Urinary Tract: 11 mm nodule at the medial limb of the left adrenal gland, indeterminate. Adrenal glands otherwise unremarkable. Kidneys equal size with symmetric enhancement. No nephrolithiasis, hydronephrosis, or focal enhancing renal mass. No hydroureter. Bladder within normal limits. Stomach/Bowel: Stomach within normal limits. Mild hazy stranding about the duodenum, felt to be secondary due to the inflammatory changes about the adjacent pancreas. No evidence for bowel obstruction. Colonic diverticulosis without evidence for acute diverticulitis. Intraluminal fluid density with mild mucosal enhancement noted about the ascending colon, suspected to be secondary in nature due to the inflammatory changes within the upper abdomen. No other significant inflammatory changes about the bowels. No findings to suggest acute appendicitis. Vascular/Lymphatic: Advanced aorto bi-iliac atherosclerotic disease. No aneurysm. No adenopathy. Reproductive: Probable small fibroid noted within the uterus. Uterus otherwise unremarkable. Ovaries within normal limits. Other: No free intraperitoneal air. Small  volume free fluid within the upper abdomen. Musculoskeletal: No acute osseous abnormality. No worrisome lytic or blastic osseous lesions. IMPRESSION: 1. Hazy inflammatory stranding with free fluid about the pancreas, suggesting acute pancreatitis. Correlation with serum lipase recommended. 2. Cholelithiasis with dilatation of the common bile duct up to 14 mm. Question intraluminal fluid density within the distal common bile duct, raising the possibility for possible choledocholithiasis. This is suspected to reflect the underlying etiology for acute pancreatitis. Gallbladder itself also appears somewhat inflamed with associated mucosal enhancement. This could be further assessed with dedicated right upper quadrant ultrasound. 3. Colonic diverticulosis without evidence for acute diverticulitis. 4. Fibroid uterus. 5. Severe hepatic steatosis. 6. Advanced aorto bi-iliac atherosclerotic disease. 7. 11 mm left adrenal nodule, indeterminate. Further evaluation with dedicated adrenal mass protocol CT and/or MRI suggested. This could be performed on a nonemergent basis. Electronically Signed   By: Rise Mu M.D.   On: 01/30/2017 01:10    Procedures Procedures (including critical care time)  DIAGNOSTIC STUDIES: Oxygen Saturation is 97% on RA, normal by my interpretation.    COORDINATION OF CARE: 11:48 PM Discussed treatment plan with pt at bedside and pt agreed to plan.  Medications Ordered in ED Medications  ondansetron (ZOFRAN) injection 4 mg (not administered)  sodium chloride 0.9 % bolus 1,000 mL (not administered)  potassium chloride  30 mEq in sodium chloride 0.9 % 265 mL (KCL MULTIRUN) IVPB (not administered)  morphine 4 MG/ML injection 4 mg (not administered)     Initial Impression / Assessment and Plan / ED Course  I have reviewed the triage vital signs and the nursing notes.  Pertinent labs & imaging results that were available during my care of the patient were reviewed by me and  considered in my medical decision making (see chart for details).  Abdominal pain with nausea and vomiting. M is unremarkable, but electrolytes show evidence of a block acidosis with elevated anion gap which is presumably alcoholic ketoacidosis. Lipase is markedly elevated consistent with pancreatitis. Mild elevation of AST and bilirubin. She is noted to have macrocytic anemia consistent with alcohol abuse. Glucose is elevated and absence of prior diagnosis of diabetes. She sent for CT of abdomen and pelvis which confirms presence of acute pancreatitis and gallbladder wall thickening and cholelithiasis and common bile duct dilatation are noted. Case is discussed with Dr. Maryfrances Bunnell of [facility] who agrees to admit the patient.  Final Clinical Impressions(s) / ED Diagnoses   Final diagnoses:  Acute pancreatitis without infection or necrosis, unspecified pancreatitis type  Calculus of gallbladder without cholecystitis without obstruction  Macrocytic anemia  Alcohol abuse  Elevated AST (SGOT)  Elevated glucose level  Alcoholic ketoacidosis    New Prescriptions Current Discharge Medication List     I personally performed the services described in this documentation, which was scribed in my presence. The recorded information has been reviewed and is accurate.      Dione Booze, MD 01/30/17 [phone]

## 2017-01-30 ENCOUNTER — Emergency Department (HOSPITAL_COMMUNITY): Payer: PPO

## 2017-01-30 ENCOUNTER — Encounter (HOSPITAL_COMMUNITY): Payer: Self-pay

## 2017-01-30 ENCOUNTER — Inpatient Hospital Stay (HOSPITAL_COMMUNITY): Payer: PPO

## 2017-01-30 ENCOUNTER — Other Ambulatory Visit (HOSPITAL_COMMUNITY): Payer: PPO

## 2017-01-30 DIAGNOSIS — F1020 Alcohol dependence, uncomplicated: Secondary | ICD-10-CM | POA: Diagnosis not present

## 2017-01-30 DIAGNOSIS — R935 Abnormal findings on diagnostic imaging of other abdominal regions, including retroperitoneum: Secondary | ICD-10-CM | POA: Diagnosis not present

## 2017-01-30 DIAGNOSIS — K8065 Calculus of gallbladder and bile duct with chronic cholecystitis with obstruction: Secondary | ICD-10-CM | POA: Diagnosis not present

## 2017-01-30 DIAGNOSIS — K851 Biliary acute pancreatitis without necrosis or infection: Secondary | ICD-10-CM | POA: Diagnosis not present

## 2017-01-30 DIAGNOSIS — G629 Polyneuropathy, unspecified: Secondary | ICD-10-CM | POA: Diagnosis not present

## 2017-01-30 DIAGNOSIS — I872 Venous insufficiency (chronic) (peripheral): Secondary | ICD-10-CM | POA: Diagnosis not present

## 2017-01-30 DIAGNOSIS — K859 Acute pancreatitis without necrosis or infection, unspecified: Secondary | ICD-10-CM | POA: Diagnosis not present

## 2017-01-30 DIAGNOSIS — E43 Unspecified severe protein-calorie malnutrition: Secondary | ICD-10-CM | POA: Diagnosis not present

## 2017-01-30 DIAGNOSIS — E872 Acidosis, unspecified: Secondary | ICD-10-CM | POA: Diagnosis present

## 2017-01-30 DIAGNOSIS — F1010 Alcohol abuse, uncomplicated: Secondary | ICD-10-CM | POA: Diagnosis not present

## 2017-01-30 DIAGNOSIS — F419 Anxiety disorder, unspecified: Secondary | ICD-10-CM | POA: Diagnosis not present

## 2017-01-30 DIAGNOSIS — R197 Diarrhea, unspecified: Secondary | ICD-10-CM | POA: Diagnosis not present

## 2017-01-30 DIAGNOSIS — S0990XA Unspecified injury of head, initial encounter: Secondary | ICD-10-CM | POA: Diagnosis not present

## 2017-01-30 DIAGNOSIS — W19XXXA Unspecified fall, initial encounter: Secondary | ICD-10-CM | POA: Diagnosis not present

## 2017-01-30 DIAGNOSIS — K8010 Calculus of gallbladder with chronic cholecystitis without obstruction: Secondary | ICD-10-CM | POA: Diagnosis not present

## 2017-01-30 DIAGNOSIS — E876 Hypokalemia: Secondary | ICD-10-CM

## 2017-01-30 DIAGNOSIS — N390 Urinary tract infection, site not specified: Secondary | ICD-10-CM | POA: Diagnosis not present

## 2017-01-30 DIAGNOSIS — R1110 Vomiting, unspecified: Secondary | ICD-10-CM | POA: Diagnosis not present

## 2017-01-30 DIAGNOSIS — F17210 Nicotine dependence, cigarettes, uncomplicated: Secondary | ICD-10-CM | POA: Diagnosis not present

## 2017-01-30 DIAGNOSIS — R739 Hyperglycemia, unspecified: Secondary | ICD-10-CM | POA: Diagnosis not present

## 2017-01-30 DIAGNOSIS — K8020 Calculus of gallbladder without cholecystitis without obstruction: Secondary | ICD-10-CM | POA: Diagnosis not present

## 2017-01-30 DIAGNOSIS — R269 Unspecified abnormalities of gait and mobility: Secondary | ICD-10-CM | POA: Diagnosis not present

## 2017-01-30 DIAGNOSIS — D539 Nutritional anemia, unspecified: Secondary | ICD-10-CM | POA: Diagnosis not present

## 2017-01-30 DIAGNOSIS — J9811 Atelectasis: Secondary | ICD-10-CM | POA: Diagnosis not present

## 2017-01-30 DIAGNOSIS — K8050 Calculus of bile duct without cholangitis or cholecystitis without obstruction: Secondary | ICD-10-CM | POA: Diagnosis not present

## 2017-01-30 DIAGNOSIS — I10 Essential (primary) hypertension: Secondary | ICD-10-CM | POA: Diagnosis not present

## 2017-01-30 DIAGNOSIS — S199XXA Unspecified injury of neck, initial encounter: Secondary | ICD-10-CM | POA: Diagnosis not present

## 2017-01-30 DIAGNOSIS — K8040 Calculus of bile duct with cholecystitis, unspecified, without obstruction: Secondary | ICD-10-CM | POA: Diagnosis not present

## 2017-01-30 DIAGNOSIS — R51 Headache: Secondary | ICD-10-CM | POA: Diagnosis not present

## 2017-01-30 DIAGNOSIS — E278 Other specified disorders of adrenal gland: Secondary | ICD-10-CM | POA: Diagnosis not present

## 2017-01-30 DIAGNOSIS — F329 Major depressive disorder, single episode, unspecified: Secondary | ICD-10-CM | POA: Diagnosis not present

## 2017-01-30 DIAGNOSIS — J90 Pleural effusion, not elsewhere classified: Secondary | ICD-10-CM | POA: Diagnosis not present

## 2017-01-30 DIAGNOSIS — K8045 Calculus of bile duct with chronic cholecystitis with obstruction: Secondary | ICD-10-CM | POA: Diagnosis not present

## 2017-01-30 DIAGNOSIS — R0902 Hypoxemia: Secondary | ICD-10-CM | POA: Diagnosis not present

## 2017-01-30 DIAGNOSIS — K839 Disease of biliary tract, unspecified: Secondary | ICD-10-CM | POA: Diagnosis not present

## 2017-01-30 DIAGNOSIS — Z66 Do not resuscitate: Secondary | ICD-10-CM | POA: Diagnosis not present

## 2017-01-30 DIAGNOSIS — Z6821 Body mass index (BMI) 21.0-21.9, adult: Secondary | ICD-10-CM | POA: Diagnosis not present

## 2017-01-30 DIAGNOSIS — E785 Hyperlipidemia, unspecified: Secondary | ICD-10-CM | POA: Diagnosis not present

## 2017-01-30 LAB — VITAMIN B12: Vitamin B-12: 294 pg/mL (ref 180–914)

## 2017-01-30 LAB — CBC
HCT: 25.8 % — ABNORMAL LOW (ref 36.0–46.0)
Hemoglobin: 8.8 g/dL — ABNORMAL LOW (ref 12.0–15.0)
MCH: 37 pg — AB (ref 26.0–34.0)
MCHC: 34.1 g/dL (ref 30.0–36.0)
MCV: 108.4 fL — AB (ref 78.0–100.0)
PLATELETS: 162 10*3/uL (ref 150–400)
RBC: 2.38 MIL/uL — ABNORMAL LOW (ref 3.87–5.11)
RDW: 18.4 % — AB (ref 11.5–15.5)
WBC: 7.8 10*3/uL (ref 4.0–10.5)

## 2017-01-30 LAB — COMPREHENSIVE METABOLIC PANEL
ALK PHOS: 81 U/L (ref 38–126)
ALT: 21 U/L (ref 14–54)
AST: 50 U/L — AB (ref 15–41)
Albumin: 3.2 g/dL — ABNORMAL LOW (ref 3.5–5.0)
Anion gap: 8 (ref 5–15)
BUN: 13 mg/dL (ref 6–20)
CALCIUM: 8.6 mg/dL — AB (ref 8.9–10.3)
CHLORIDE: 113 mmol/L — AB (ref 101–111)
CO2: 22 mmol/L (ref 22–32)
CREATININE: 0.54 mg/dL (ref 0.44–1.00)
GFR calc Af Amer: >60 mL/min (ref >60)
Glucose, Bld: 94 mg/dL (ref 65–99)
Potassium: 2.6 mmol/L — CL (ref 3.5–5.1)
SODIUM: 143 mmol/L (ref 135–145)
Total Bilirubin: 1.4 mg/dL — ABNORMAL HIGH (ref 0.3–1.2)
Total Protein: 5.5 g/dL — ABNORMAL LOW (ref 6.5–8.1)

## 2017-01-30 LAB — MAGNESIUM: Magnesium: 1.5 mg/dL — ABNORMAL LOW (ref 1.7–2.4)

## 2017-01-30 LAB — PROTIME-INR
INR: 0.95
Prothrombin Time: 12.7 seconds (ref 11.4–15.2)

## 2017-01-30 LAB — BASIC METABOLIC PANEL
Anion gap: 9 (ref 5–15)
BUN: 9 mg/dL (ref 6–20)
CHLORIDE: 107 mmol/L (ref 101–111)
CO2: 23 mmol/L (ref 22–32)
CREATININE: 0.45 mg/dL (ref 0.44–1.00)
Calcium: 8.8 mg/dL — ABNORMAL LOW (ref 8.9–10.3)
GFR calc Af Amer: >60 mL/min (ref >60)
GFR calc non Af Amer: >60 mL/min (ref >60)
GLUCOSE: 97 mg/dL (ref 65–99)
Potassium: 2.5 mmol/L — CL (ref 3.5–5.1)
Sodium: 139 mmol/L (ref 135–145)

## 2017-01-30 MED ORDER — ONDANSETRON HCL 4 MG/2ML IJ SOLN
4.0000 mg | Freq: Four times a day (QID) | INTRAMUSCULAR | Status: DC | PRN
  Administered 2017-01-30 – 2017-02-03 (×4): 4 mg via INTRAVENOUS
  Filled 2017-01-30 (×2): qty 2

## 2017-01-30 MED ORDER — GABAPENTIN 300 MG PO CAPS
600.0000 mg | ORAL_CAPSULE | Freq: Three times a day (TID) | ORAL | Status: DC
  Administered 2017-01-30 – 2017-02-04 (×12): 600 mg via ORAL
  Filled 2017-01-30 (×15): qty 2

## 2017-01-30 MED ORDER — SODIUM CHLORIDE 0.9 % IV SOLN
30.0000 meq | Freq: Once | INTRAVENOUS | Status: AC
  Administered 2017-01-30: 30 meq via INTRAVENOUS
  Filled 2017-01-30: qty 15

## 2017-01-30 MED ORDER — SODIUM CHLORIDE 0.9 % IV SOLN
30.0000 meq | INTRAVENOUS | Status: AC
  Administered 2017-01-30 (×2): 30 meq via INTRAVENOUS
  Filled 2017-01-30 (×2): qty 15

## 2017-01-30 MED ORDER — NICOTINE 14 MG/24HR TD PT24
14.0000 mg | MEDICATED_PATCH | Freq: Every day | TRANSDERMAL | Status: DC
  Administered 2017-01-30: 14 mg via TRANSDERMAL
  Filled 2017-01-30: qty 1

## 2017-01-30 MED ORDER — SODIUM CHLORIDE 0.9 % IV SOLN: 30.0000 meq | INTRAVENOUS | Status: DC

## 2017-01-30 MED ORDER — DEXTROSE-NACL 5-0.45 % IV SOLN
INTRAVENOUS | Status: DC
  Administered 2017-01-30 – 2017-02-03 (×5): via INTRAVENOUS

## 2017-01-30 MED ORDER — MORPHINE SULFATE (PF) 4 MG/ML IV SOLN
1.0000 mg | INTRAVENOUS | Status: DC | PRN
  Administered 2017-01-30 – 2017-02-03 (×9): 1 mg via INTRAVENOUS
  Filled 2017-01-30 (×10): qty 1

## 2017-01-30 MED ORDER — LORAZEPAM 1 MG PO TABS
1.0000 mg | ORAL_TABLET | Freq: Four times a day (QID) | ORAL | Status: AC | PRN
  Administered 2017-01-30 – 2017-02-01 (×2): 1 mg via ORAL
  Filled 2017-01-30 (×2): qty 1

## 2017-01-30 MED ORDER — HYDROMORPHONE HCL 1 MG/ML IJ SOLN
1.0000 mg | INTRAMUSCULAR | Status: DC | PRN
  Administered 2017-01-30: 1 mg via INTRAVENOUS
  Filled 2017-01-30: qty 1

## 2017-01-30 MED ORDER — ONDANSETRON HCL 4 MG PO TABS: 4.0000 mg | ORAL_TABLET | Freq: Four times a day (QID) | ORAL | Status: DC | PRN

## 2017-01-30 MED ORDER — ENOXAPARIN SODIUM 40 MG/0.4ML ~~LOC~~ SOLN
40.0000 mg | SUBCUTANEOUS | Status: DC
  Administered 2017-01-30 – 2017-02-02 (×3): 40 mg via SUBCUTANEOUS
  Filled 2017-01-30 (×3): qty 0.4

## 2017-01-30 MED ORDER — ADULT MULTIVITAMIN W/MINERALS CH
1.0000 | ORAL_TABLET | Freq: Every day | ORAL | Status: DC
  Administered 2017-01-30 – 2017-02-06 (×6): 1 via ORAL
  Filled 2017-01-30 (×7): qty 1

## 2017-01-30 MED ORDER — FOLIC ACID 1 MG PO TABS
1.0000 mg | ORAL_TABLET | Freq: Every day | ORAL | Status: DC
  Administered 2017-01-30 – 2017-02-06 (×6): 1 mg via ORAL
  Filled 2017-01-30 (×7): qty 1

## 2017-01-30 MED ORDER — SODIUM CHLORIDE 0.9 % IJ SOLN
INTRAMUSCULAR | Status: AC
  Filled 2017-01-30: qty 50

## 2017-01-30 MED ORDER — IOPAMIDOL (ISOVUE-300) INJECTION 61%
INTRAVENOUS | Status: AC
  Filled 2017-01-30: qty 100

## 2017-01-30 MED ORDER — THIAMINE HCL 100 MG/ML IJ SOLN
100.0000 mg | Freq: Every day | INTRAMUSCULAR | Status: DC
  Administered 2017-02-03 – 2017-02-04 (×2): 100 mg via INTRAVENOUS
  Filled 2017-01-30 (×2): qty 2

## 2017-01-30 MED ORDER — POTASSIUM CHLORIDE IN NACL 20-0.9 MEQ/L-% IV SOLN
INTRAVENOUS | Status: DC
  Administered 2017-01-30: 04:00:00 via INTRAVENOUS
  Filled 2017-01-30: qty 1000

## 2017-01-30 MED ORDER — LORAZEPAM 2 MG/ML IJ SOLN
1.0000 mg | Freq: Four times a day (QID) | INTRAMUSCULAR | Status: AC | PRN
  Administered 2017-01-30 – 2017-02-01 (×4): 1 mg via INTRAVENOUS
  Filled 2017-01-30 (×4): qty 1

## 2017-01-30 MED ORDER — VITAMIN B-1 100 MG PO TABS
100.0000 mg | ORAL_TABLET | Freq: Every day | ORAL | Status: DC
  Administered 2017-01-30 – 2017-02-06 (×6): 100 mg via ORAL
  Filled 2017-01-30 (×7): qty 1

## 2017-01-30 NOTE — ED Notes (Signed)
Pt transported to CT ?

## 2017-01-30 NOTE — Progress Notes (Signed)
Writer text MD regarding K+ 2.6 Will await new orders.

## 2017-01-30 NOTE — H&P (Signed)
History and Physical  Patient Name: April [name]     ZOX:[id number]    DOB: Jun 21, 1946    DOA: 01/29/2017 PCP: Lilia Argue   Patient coming from: Home  Chief Complaint: Abdominal pain  HPI: April [name] is a 71 y.o. female with a past medical history significant for alcohol dependence who presents with abdominal pain for 5 days.  The patient was in her usual state of health until 5 days ago when she developed abdominal pain. The pain is in the middle of her abdomen, constant, severe, worse with eating, interferes with sleep and is associated with vomiting. She tried Pepto-Bismol without relief, and today was so weak from vomiting that she fell and hit her head so she came to the ER.  ED course: -Heart rate 108, respirations and pulse is normal, blood pressure 128/80 -Na 139, K 3.3, Cr 0.85, WBC 9K, Hgb 10.6 and macrocytic and new -AST 85, total bilirubin 3.1 -Lipase 1692 -Urinalysis with some nitrites and bacteria -CT of the head and C-spine unremarkable -INR normal -CT of the abdomen and pelvis shows fatty liver, gallbladder wall thickening, CBD 14 mm, pancreatitis, and an incidental adrenal mass 11 mm -She was given IV fluids and TRH were asked to evaluate for gallstone pancreatitis          ROS: Review of Systems  Constitutional: Positive for malaise/fatigue. Negative for chills and fever.  Gastrointestinal: Positive for abdominal pain and vomiting. Negative for blood in stool, constipation, diarrhea and melena.  Genitourinary: Negative for dysuria, frequency, hematuria and urgency.  Neurological: Positive for dizziness and weakness.  All other systems reviewed and are negative.         Past Medical History:  Diagnosis Date  . Anxiety   . Current smoker   . Depression   . ETOH abuse   . History of shingles summer 2015  . HTN (hypertension)   . Hyperlipidemia   . Hypertension   . Leg edema   . Neuropathy [facility])     Past Surgical History:    Procedure Laterality Date  . removal of uterine fibroids  2004    Social History: Patient lives with her daughter and son-in-law.  The patient walks unassisted. She smokes. She drinks roughly gallon of brandy per week.  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  Allergies  Allergen Reactions  . Naltrexone Shortness Of Breath  . Penicillins Rash    Has patient had a PCN reaction causing immediate rash, facial/tongue/throat swelling, SOB or lightheadedness with hypotension: Yes immediate rash  Has patient had a PCN reaction causing severe rash involving mucus membranes or skin necrosis: Yes Has patient had a PCN reaction that required hospitalization": No Has patient had a PCN reaction occurring within the last 10 years: Yes If all of the above answers are "NO", then may proceed with Cephalosporin use.     Family history: family history includes Heart disease in her father.  Prior to Admission medications   Medication Sig Start Date End Date Taking? Authorizing Provider  amitriptyline (ELAVIL) 25 MG tablet Take 25 mg by mouth at bedtime as needed for sleep.  08/27/16  Yes Historical Provider, MD  clonazePAM (KLONOPIN) 0.5 MG tablet Take 0.5 mg by mouth 2 (two) times daily as needed for anxiety.   Yes Historical Provider, MD  gabapentin (NEURONTIN) 300 MG capsule Take 2 capsules (600 mg total) by mouth 3 (three) times daily. 10/29/16  Yes Anson Fret, MD  Vitamin D,  Ergocalciferol, (DRISDOL) 50000 units CAPS capsule Take 50,000 Units by mouth once a week. 07/31/16  Yes Historical Provider, MD  Aspirin-Acetaminophen-Caffeine (GOODY HEADACHE PO) Take 1 each by mouth daily as needed (headache).    Historical Provider, MD       Physical Exam: BP 134/78   Pulse 91   Resp 18   Ht 5\' 6"  (1.676 m)   Wt 56.7 kg (125 lb)   SpO2 95%   BMI 20.18 kg/m  General appearance: Thin elderly adult female, alert and in no acute distress.   Eyes: Anicteric, conjunctiva pink,  lids and lashes normal. PERRL.    ENT: No nasal deformity, discharge, epistaxis.  Hearing normal. OP with dry MM without lesions.   Neck: No neck masses.  Trachea midline.  No thyromegaly/tenderness. Lymph: No cervical or supraclavicular lymphadenopathy. Skin: Warm and dry.  No jaundice.  No suspicious rashes or lesions. Cardiac: RRR, nl S1-S2, no murmurs appreciated.  Capillary refill is brisk.  JVP normal.  No LE edema.  Radial and DP pulses 2+ and symmetric. Respiratory: Normal respiratory rate and rhythm.  CTAB without rales or wheezes. Abdomen: Abdomen soft.  Mild nonfocal TTP, without guarding or rigidity. No ascites, distension.   MSK: No deformities or effusions.  No cyanosis or clubbing. Neuro: Cranial nerves normal.  Sensation intact to light touch. Speech is fluent.  Muscle strength genearlized weaknes, symmetric.    Psych: Sensorium intact and responding to questions, attention normal.  Behavior appropriate.  Affect normal.  Judgment and insight appear normal.     Labs on Admission:  I have personally reviewed following labs and imaging studies: CBC:  Recent Labs Lab 01/29/17 2101  WBC 9.0  HGB 10.6*  HCT 32.1*  MCV 108.8*  PLT 206   Basic Metabolic Panel:  Recent Labs Lab 01/29/17 2101  NA 139  K 3.3*  CL 105  CO2 16*  GLUCOSE 201*  BUN 17  CREATININE 0.85  CALCIUM 9.8   GFR: Estimated Creatinine Clearance: 55.9 mL/min (by C-G formula based on SCr of 0.85 mg/dL).  Liver Function Tests:  Recent Labs Lab 01/29/17 2101  AST 85*  ALT 30  ALKPHOS 103  BILITOT 3.1*  PROT 7.2  ALBUMIN 4.4    Recent Labs Lab 01/29/17 2101  LIPASE 1,692*   No results for input(s): AMMONIA in the last 168 hours. Coagulation Profile:  Recent Labs Lab 01/29/17 2358  INR 0.95   Cardiac Enzymes: No results for input(s): CKTOTAL, CKMB, CKMBINDEX, TROPONINI in the last 168 hours. BNP (last 3 results) No results for input(s): PROBNP in the last 8760  hours. HbA1C: No results for input(s): HGBA1C in the last 72 hours. CBG: No results for input(s): GLUCAP in the last 168 hours. Lipid Profile: No results for input(s): CHOL, HDL, LDLCALC, TRIG, CHOLHDL, LDLDIRECT in the last 72 hours. Thyroid Function Tests: No results for input(s): TSH, T4TOTAL, FREET4, T3FREE, THYROIDAB in the last 72 hours. Anemia Panel: No results for input(s): VITAMINB12, FOLATE, FERRITIN, TIBC, IRON, RETICCTPCT in the last 72 hours. Sepsis Labs: Invalid input(s): PROCALCITONIN, LACTICIDVEN No results found for this or any previous visit (from the past 240 hour(s)).       Radiological Exams on Admission: Personally reviewed CT head and c-spine report and CT abdomen report: Ct Head Wo Contrast  Result Date: 01/30/2017 CLINICAL DATA:  Initial evaluation for acute trauma, fall. EXAM: CT HEAD WITHOUT CONTRAST CT CERVICAL SPINE WITHOUT CONTRAST TECHNIQUE: Multidetector CT imaging of the head and cervical spine was  performed following the standard protocol without intravenous contrast. Multiplanar CT image reconstructions of the cervical spine were also generated. COMPARISON:  Prior CT from 02/24/2016. FINDINGS: CT HEAD FINDINGS Brain: Generalized age-related cerebral atrophy with mild to moderate chronic microvascular ischemic disease. No acute intracranial hemorrhage. No evidence for acute large vessel territory infarct. No mass lesion, midline shift or mass effect. No hydrocephalus. No extra-axial fluid collection. Vascular: No hyperdense vessel. Scattered vascular calcifications noted within the carotid siphons. Skull: Scalp soft tissues demonstrate no acute abnormality. Calvarium intact. Sinuses/Orbits: Globes and orbital soft tissues within normal limits. Small fluid level noted within the left maxillary sinus. Visualized paranasal sinuses are otherwise largely clear. No mastoid effusion. Degenerative changes noted about the temporomandibular joints bilaterally. CT CERVICAL  SPINE FINDINGS Alignment: Straightening of the normal cervical lordosis. No listhesis. Skull base and vertebrae: The skullbase intact. Normal C1-2 articulations are preserved. Dens intact. Vertebral body heights are maintained. The bone no acute fracture. Soft tissues and spinal canal: The visualized soft tissues of the neck demonstrate no acute abnormality. No prevertebral edema. Prominent vascular calcifications present about the carotid bifurcations. Disc levels: Moderate degenerative spondylolysis present at C5-6 and C6-7. The the the prominent left-sided facet arthrosis at C3-4. Upper chest: Visualized upper mediastinum unremarkable. Emphysematous changes noted within the visualized lung apices. IMPRESSION: CT BRAIN: 1. No acute intracranial process identified. 2. Generalized age-related cerebral atrophy with mild chronic small vessel ischemic disease. 3. Small fluid level within the left maxillary sinus, suggesting acute sinusitis. CT CERVICAL SPINE: 1. No acute traumatic injury within the cervical spine. 2. Moderate degenerative spondylolysis at C5-6 and C6-7. 3. Emphysema. Electronically Signed   By: Rise Mu M.D.   On: 01/30/2017 00:52   Ct Cervical Spine Wo Contrast  Result Date: 01/30/2017 CLINICAL DATA:  Initial evaluation for acute trauma, fall. EXAM: CT HEAD WITHOUT CONTRAST CT CERVICAL SPINE WITHOUT CONTRAST TECHNIQUE: Multidetector CT imaging of the head and cervical spine was performed following the standard protocol without intravenous contrast. Multiplanar CT image reconstructions of the cervical spine were also generated. COMPARISON:  Prior CT from 02/24/2016. FINDINGS: CT HEAD FINDINGS Brain: Generalized age-related cerebral atrophy with mild to moderate chronic microvascular ischemic disease. No acute intracranial hemorrhage. No evidence for acute large vessel territory infarct. No mass lesion, midline shift or mass effect. No hydrocephalus. No extra-axial fluid collection.  Vascular: No hyperdense vessel. Scattered vascular calcifications noted within the carotid siphons. Skull: Scalp soft tissues demonstrate no acute abnormality. Calvarium intact. Sinuses/Orbits: Globes and orbital soft tissues within normal limits. Small fluid level noted within the left maxillary sinus. Visualized paranasal sinuses are otherwise largely clear. No mastoid effusion. Degenerative changes noted about the temporomandibular joints bilaterally. CT CERVICAL SPINE FINDINGS Alignment: Straightening of the normal cervical lordosis. No listhesis. Skull base and vertebrae: The skullbase intact. Normal C1-2 articulations are preserved. Dens intact. Vertebral body heights are maintained. The bone no acute fracture. Soft tissues and spinal canal: The visualized soft tissues of the neck demonstrate no acute abnormality. No prevertebral edema. Prominent vascular calcifications present about the carotid bifurcations. Disc levels: Moderate degenerative spondylolysis present at C5-6 and C6-7. The the the prominent left-sided facet arthrosis at C3-4. Upper chest: Visualized upper mediastinum unremarkable. Emphysematous changes noted within the visualized lung apices. IMPRESSION: CT BRAIN: 1. No acute intracranial process identified. 2. Generalized age-related cerebral atrophy with mild chronic small vessel ischemic disease. 3. Small fluid level within the left maxillary sinus, suggesting acute sinusitis. CT CERVICAL SPINE: 1. No acute traumatic injury within  the cervical spine. 2. Moderate degenerative spondylolysis at C5-6 and C6-7. 3. Emphysema. Electronically Signed   By: Rise MuBenjamin  McClintock M.D.   On: 01/30/2017 00:52   Ct Abdomen Pelvis W Contrast  Result Date: 01/30/2017 CLINICAL DATA:  Initial evaluation for acute nausea, vomiting, diarrhea. EXAM: CT ABDOMEN AND PELVIS WITH CONTRAST TECHNIQUE: Multidetector CT imaging of the abdomen and pelvis was performed using the standard protocol following bolus  administration of intravenous contrast. CONTRAST:  100mL ISOVUE-300 IOPAMIDOL (ISOVUE-300) INJECTION 61% COMPARISON:  None. FINDINGS: Lower chest: Scattered atelectatic changes present within the visualized lung bases. Visualized lungs are otherwise clear. Hepatobiliary: Diffuse hypoattenuation liver consistent with steatosis. Liver otherwise unremarkable. Gallbladder largely contracted. Calcification within the gallbladder with likely reflects a stone. There is mild mucosal enhancement about the gallbladder itself. Common bile duct is dilated up to 14 mm. There is question of possible intraluminal density within the distal common bile duct (series 2, image 34), which may reflect choledocholithiasis. Pancreas: Hazy inflammatory stranding about the pancreas, most prevalent about the pancreatic head and uncinate process, with additional stranding near the pancreatic tail. Findings suggestive of possible acute pancreatitis. This may be related to gallstones given the findings within the gallbladder and common bile duct. No loculated fluid collection. No findings to suggest pancreatic necrosis. Spleen: Spleen within normal limits. Adrenals/Urinary Tract: 11 mm nodule at the medial limb of the left adrenal gland, indeterminate. Adrenal glands otherwise unremarkable. Kidneys equal size with symmetric enhancement. No nephrolithiasis, hydronephrosis, or focal enhancing renal mass. No hydroureter. Bladder within normal limits. Stomach/Bowel: Stomach within normal limits. Mild hazy stranding about the duodenum, felt to be secondary due to the inflammatory changes about the adjacent pancreas. No evidence for bowel obstruction. Colonic diverticulosis without evidence for acute diverticulitis. Intraluminal fluid density with mild mucosal enhancement noted about the ascending colon, suspected to be secondary in nature due to the inflammatory changes within the upper abdomen. No other significant inflammatory changes about the  bowels. No findings to suggest acute appendicitis. Vascular/Lymphatic: Advanced aorto bi-iliac atherosclerotic disease. No aneurysm. No adenopathy. Reproductive: Probable small fibroid noted within the uterus. Uterus otherwise unremarkable. Ovaries within normal limits. Other: No free intraperitoneal air. Small volume free fluid within the upper abdomen. Musculoskeletal: No acute osseous abnormality. No worrisome lytic or blastic osseous lesions. IMPRESSION: 1. Hazy inflammatory stranding with free fluid about the pancreas, suggesting acute pancreatitis. Correlation with serum lipase recommended. 2. Cholelithiasis with dilatation of the common bile duct up to 14 mm. Question intraluminal fluid density within the distal common bile duct, raising the possibility for possible choledocholithiasis. This is suspected to reflect the underlying etiology for acute pancreatitis. Gallbladder itself also appears somewhat inflamed with associated mucosal enhancement. This could be further assessed with dedicated right upper quadrant ultrasound. 3. Colonic diverticulosis without evidence for acute diverticulitis. 4. Fibroid uterus. 5. Severe hepatic steatosis. 6. Advanced aorto bi-iliac atherosclerotic disease. 7. 11 mm left adrenal nodule, indeterminate. Further evaluation with dedicated adrenal mass protocol CT and/or MRI suggested. This could be performed on a nonemergent basis. Electronically Signed   By: Rise MuBenjamin  McClintock M.D.   On: 01/30/2017 01:10        Assessment/Plan  1. Pancreatitis:  Alcohol certainly contributing, although her intake is constant for a long time at about 2 half gallons brandy per week.  Currently, the CBD is dilated, and LFTs are elevated, suggesting gallstone pancreatitis -Nothing by mouth -M IVF -Hydromorphone and ondansetron for symptom control -Trend CMP -GI consult regarding MRCP versus ERCP -Alcohol cessation  strongly advised -Tobacco cessation advised   2. Macrocytic  anemia:  Likely from alcohol, possible nutritional deficiency. -Check B12 and folate  3. Hypokalemia:  From alcohol. -Check mag -M IVF with  4. Metabolic acidosis:  Mild -Trend BMP  5. Adrenal mass:  -Nonemergent MRI or CT imaging as outpatient  6. Alcohol dependence:  She is not very interested in quitting smoking, nor quitting alcohol. -Thiamine and folate -MVI -C1 protocol with lorazepam -Continue gabapentin for alcohol withdrawal  7. Other medications:  -Hold home clonazepam, which she takes rarely for sleep, while she is on lorazepam -Continue gabapentin, which she takes for postherpetic neuralgia     DVT prophylaxis: Lovenox  Code Status: FULL Family Communication: Daughter at bedside  Disposition Plan: Anticipate IV fluids, conservative mgmt of pancreatitis, alchohol dependence, GI consult regarding suspected gallstone. Consults called: None overnight Admission status: INPATIENT         Medical decision making: Patient seen at 2:00 AM on 01/30/2017.  The patient was discussed with Dr. Preston Fleeting.  What exists of the patient's chart was reviewed in depth and summarized above.  Clinical condition: stable.        Alberteen Sam [facility] Pager [phone]     At the time of admission, it appears that the appropriate admission status for this patient is INPATIENT. This is judged to be reasonable and necessary in order to provide the required intensity of service to ensure the patient's safety given the presenting symptoms, physical exam findings, and initial radiographic and laboratory data in the context of their chronic comorbidities.  Together, these circumstances are felt to place her at high risk for further clinical deterioration threatening life, limb, or organ.   Patient requires inpatient status due to high intensity of service, high risk for further deterioration and high frequency of surveillance required because of this acute illness  that poses a threat to life, limb or bodily function.  Doctors at support inpatient status included inability to tolerate by mouth, requirement for likely greater than 24 hours of IV narcotics and IV fluids, and GI consultation for suspected pancreatitis from gallstone  I certify that at the point of admission it is my clinical judgment that the patient will require inpatient hospital care spanning beyond 2 midnights from the point of admission and that early discharge would result in unnecessary risk of decompensation and readmission or threat to life, limb or bodily function.

## 2017-01-30 NOTE — Progress Notes (Signed)
PROGRESS NOTE    April [name]  ZOX:[id number] DOB: 1946-11-01 DOA: 01/29/2017 PCP: Lilia Argue    Brief Narrative:  71 yo female with past medical history of etho abuse, presents with abdominal pain for the last 5 days, epigastric and constant, associated with vomiting. On the initial physical examination, hemodynamically stable, with positive abdominal pain. Lipase 1,692. CT abdomen with pancreatitis with common bile duct dilatation with possible choledocholithiasis. Admitted for further treatment. Follow on MRCP.   Assessment & Plan:   Principal Problem:   Gallstone pancreatitis Active Problems:   Alcohol abuse   Hypokalemia   Macrocytic anemia   Metabolic acidosis   1. Acute gallstone pancreatitis. Will continue pain control with morphine, IV fluids and antiemetics. Will proceed with MRCP and will call GI if signs of choledocholithiasis. NPO for now. Follow lipase in am. No systemic inflammatory syndrome. Will check abdominal US to confirm gallstones.    2. Common bile duct dilatation. Serum bilirubin down from 3.1 to 1,4. AST 21 and ALT 21. Alkaline phosphatase normal range. Will continue supportive care, follow on MRCP. Patient will need surgical evaluation on this admission for cholecystectomy.   3. Hypokalemia and hypomagnesemia. Will correct K with kcl IV, will follow k this pm, target level of 4. Renal function with cr at 0.54. Correct magnesium with IV mag sulfate, target level of 2.   4. Etho dependence. Will continue as needed benzodiazepines, no signs of active withdrawal. Will continue IV fluids with dextrose. Multivitamins, including folic acid and thiamine.   5. Tobacco abuse. Smoking cessation, continue nicotine patch.    DVT prophylaxis: enoxaparin  Code Status: DNR  Family Communication: No family at the bedside Disposition Plan: Home  Consultants:     Procedures:   Antimicrobials:   Subjective: Abdominal pain persistent but improved, no  nausea or vomiting. Patient has been NPO. No chest pain or dyspnea.   Objective: Vitals:   01/30/17 0230 01/30/17 0253 01/30/17 0309 01/30/17 0311  BP: 134/78  (!) 95/54   Pulse:   82   Resp:   18   Temp:  98 F (36.7 C) 98.4 F (36.9 C)   TempSrc:  Oral Oral   SpO2:   92%   Weight:    55.7 kg (122 lb 12.7 oz)  Height:    5\' 6"  (1.676 m)    Intake/Output Summary (Last 24 hours) at 01/30/17 1015 Last data filed at 01/30/17 0333  Gross per 24 hour  Intake              265 ml  Output                0 ml  Net              265 ml   Filed Weights   01/29/17 2053 01/30/17 0311  Weight: 56.7 kg (125 lb) 55.7 kg (122 lb 12.7 oz)    Examination:  General exam: deconditioned not in pain or dyspnea E ENT: mild pallor but no icterus  Respiratory system: Clear to auscultation. Respiratory effort normal. No wheezing, rales or rhonchi Cardiovascular system: S1 & S2 heard, RRR. No JVD, murmurs, rubs, gallops or clicks. No pedal edema. Gastrointestinal system: Abdomen distended and tender to deep palpation, no peritoneal signs, no rebound, decreased bowel sounds. No organomegaly or masses felt.  Central nervous system: Alert and oriented. No focal neurological deficits. Extremities: Symmetric 5 x 5 power. Skin: No rashes, lesions or ulcers      Data  Reviewed: I have personally reviewed following labs and imaging studies  CBC:  Recent Labs Lab 01/29/17 2101 01/30/17 0550  WBC 9.0 7.8  HGB 10.6* 8.8*  HCT 32.1* 25.8*  MCV 108.8* 108.4*  PLT 206 162   Basic Metabolic Panel:  Recent Labs Lab 01/29/17 2101 01/30/17 0550  NA 139 143  K 3.3* 2.6*  CL 105 113*  CO2 16* 22  GLUCOSE 201* 94  BUN 17 13  CREATININE 0.85 0.54  CALCIUM 9.8 8.6*  MG  --  1.5*   GFR: Estimated Creatinine Clearance: 58.4 mL/min (by C-G formula based on SCr of 0.54 mg/dL). Liver Function Tests:  Recent Labs Lab 01/29/17 2101 01/30/17 0550  AST 85* 50*  ALT 30 21  ALKPHOS 103 81    BILITOT 3.1* 1.4*  PROT 7.2 5.5*  ALBUMIN 4.4 3.2*    Recent Labs Lab 01/29/17 2101  LIPASE 1,692*   No results for input(s): AMMONIA in the last 168 hours. Coagulation Profile:  Recent Labs Lab 01/29/17 2358  INR 0.95   Cardiac Enzymes: No results for input(s): CKTOTAL, CKMB, CKMBINDEX, TROPONINI in the last 168 hours. BNP (last 3 results) No results for input(s): PROBNP in the last 8760 hours. HbA1C: No results for input(s): HGBA1C in the last 72 hours. CBG: No results for input(s): GLUCAP in the last 168 hours. Lipid Profile: No results for input(s): CHOL, HDL, LDLCALC, TRIG, CHOLHDL, LDLDIRECT in the last 72 hours. Thyroid Function Tests: No results for input(s): TSH, T4TOTAL, FREET4, T3FREE, THYROIDAB in the last 72 hours. Anemia Panel: No results for input(s): VITAMINB12, FOLATE, FERRITIN, TIBC, IRON, RETICCTPCT in the last 72 hours. Sepsis Labs: No results for input(s): PROCALCITON, LATICACIDVEN in the last 168 hours.  No results found for this or any previous visit (from the past 240 hour(s)).       Radiology Studies: Ct Head Wo Contrast  Result Date: 01/30/2017 CLINICAL DATA:  Initial evaluation for acute trauma, fall. EXAM: CT HEAD WITHOUT CONTRAST CT CERVICAL SPINE WITHOUT CONTRAST TECHNIQUE: Multidetector CT imaging of the head and cervical spine was performed following the standard protocol without intravenous contrast. Multiplanar CT image reconstructions of the cervical spine were also generated. COMPARISON:  Prior CT from 02/24/2016. FINDINGS: CT HEAD FINDINGS Brain: Generalized age-related cerebral atrophy with mild to moderate chronic microvascular ischemic disease. No acute intracranial hemorrhage. No evidence for acute large vessel territory infarct. No mass lesion, midline shift or mass effect. No hydrocephalus. No extra-axial fluid collection. Vascular: No hyperdense vessel. Scattered vascular calcifications noted within the carotid siphons. Skull:  Scalp soft tissues demonstrate no acute abnormality. Calvarium intact. Sinuses/Orbits: Globes and orbital soft tissues within normal limits. Small fluid level noted within the left maxillary sinus. Visualized paranasal sinuses are otherwise largely clear. No mastoid effusion. Degenerative changes noted about the temporomandibular joints bilaterally. CT CERVICAL SPINE FINDINGS Alignment: Straightening of the normal cervical lordosis. No listhesis. Skull base and vertebrae: The skullbase intact. Normal C1-2 articulations are preserved. Dens intact. Vertebral body heights are maintained. The bone no acute fracture. Soft tissues and spinal canal: The visualized soft tissues of the neck demonstrate no acute abnormality. No prevertebral edema. Prominent vascular calcifications present about the carotid bifurcations. Disc levels: Moderate degenerative spondylolysis present at C5-6 and C6-7. The the the prominent left-sided facet arthrosis at C3-4. Upper chest: Visualized upper mediastinum unremarkable. Emphysematous changes noted within the visualized lung apices. IMPRESSION: CT BRAIN: 1. No acute intracranial process identified. 2. Generalized age-related cerebral atrophy with mild chronic small vessel  ischemic disease. 3. Small fluid level within the left maxillary sinus, suggesting acute sinusitis. CT CERVICAL SPINE: 1. No acute traumatic injury within the cervical spine. 2. Moderate degenerative spondylolysis at C5-6 and C6-7. 3. Emphysema. Electronically Signed   By: Rise Mu M.D.   On: 01/30/2017 00:52   Ct Cervical Spine Wo Contrast  Result Date: 01/30/2017 CLINICAL DATA:  Initial evaluation for acute trauma, fall. EXAM: CT HEAD WITHOUT CONTRAST CT CERVICAL SPINE WITHOUT CONTRAST TECHNIQUE: Multidetector CT imaging of the head and cervical spine was performed following the standard protocol without intravenous contrast. Multiplanar CT image reconstructions of the cervical spine were also generated.  COMPARISON:  Prior CT from 02/24/2016. FINDINGS: CT HEAD FINDINGS Brain: Generalized age-related cerebral atrophy with mild to moderate chronic microvascular ischemic disease. No acute intracranial hemorrhage. No evidence for acute large vessel territory infarct. No mass lesion, midline shift or mass effect. No hydrocephalus. No extra-axial fluid collection. Vascular: No hyperdense vessel. Scattered vascular calcifications noted within the carotid siphons. Skull: Scalp soft tissues demonstrate no acute abnormality. Calvarium intact. Sinuses/Orbits: Globes and orbital soft tissues within normal limits. Small fluid level noted within the left maxillary sinus. Visualized paranasal sinuses are otherwise largely clear. No mastoid effusion. Degenerative changes noted about the temporomandibular joints bilaterally. CT CERVICAL SPINE FINDINGS Alignment: Straightening of the normal cervical lordosis. No listhesis. Skull base and vertebrae: The skullbase intact. Normal C1-2 articulations are preserved. Dens intact. Vertebral body heights are maintained. The bone no acute fracture. Soft tissues and spinal canal: The visualized soft tissues of the neck demonstrate no acute abnormality. No prevertebral edema. Prominent vascular calcifications present about the carotid bifurcations. Disc levels: Moderate degenerative spondylolysis present at C5-6 and C6-7. The the the prominent left-sided facet arthrosis at C3-4. Upper chest: Visualized upper mediastinum unremarkable. Emphysematous changes noted within the visualized lung apices. IMPRESSION: CT BRAIN: 1. No acute intracranial process identified. 2. Generalized age-related cerebral atrophy with mild chronic small vessel ischemic disease. 3. Small fluid level within the left maxillary sinus, suggesting acute sinusitis. CT CERVICAL SPINE: 1. No acute traumatic injury within the cervical spine. 2. Moderate degenerative spondylolysis at C5-6 and C6-7. 3. Emphysema. Electronically  Signed   By: Rise Mu M.D.   On: 01/30/2017 00:52   Ct Abdomen Pelvis W Contrast  Result Date: 01/30/2017 CLINICAL DATA:  Initial evaluation for acute nausea, vomiting, diarrhea. EXAM: CT ABDOMEN AND PELVIS WITH CONTRAST TECHNIQUE: Multidetector CT imaging of the abdomen and pelvis was performed using the standard protocol following bolus administration of intravenous contrast. CONTRAST:  ISOVUE-300 IOPAMIDOL (ISOVUE-300) INJECTION 61% COMPARISON:  None. FINDINGS: Lower chest: Scattered atelectatic changes present within the visualized lung bases. Visualized lungs are otherwise clear. Hepatobiliary: Diffuse hypoattenuation liver consistent with steatosis. Liver otherwise unremarkable. Gallbladder largely contracted. Calcification within the gallbladder with likely reflects a stone. There is mild mucosal enhancement about the gallbladder itself. Common bile duct is dilated up to 14 mm. There is question of possible intraluminal density within the distal common bile duct (series 2, image 34), which may reflect choledocholithiasis. Pancreas: Hazy inflammatory stranding about the pancreas, most prevalent about the pancreatic head and uncinate process, with additional stranding near the pancreatic tail. Findings suggestive of possible acute pancreatitis. This may be related to gallstones given the findings within the gallbladder and common bile duct. No loculated fluid collection. No findings to suggest pancreatic necrosis. Spleen: Spleen within normal limits. Adrenals/Urinary Tract: 11 mm nodule at the medial limb of the left adrenal gland, indeterminate. Adrenal glands  otherwise unremarkable. Kidneys equal size with symmetric enhancement. No nephrolithiasis, hydronephrosis, or focal enhancing renal mass. No hydroureter. Bladder within normal limits. Stomach/Bowel: Stomach within normal limits. Mild hazy stranding about the duodenum, felt to be secondary due to the inflammatory changes about the  adjacent pancreas. No evidence for bowel obstruction. Colonic diverticulosis without evidence for acute diverticulitis. Intraluminal fluid density with mild mucosal enhancement noted about the ascending colon, suspected to be secondary in nature due to the inflammatory changes within the upper abdomen. No other significant inflammatory changes about the bowels. No findings to suggest acute appendicitis. Vascular/Lymphatic: Advanced aorto bi-iliac atherosclerotic disease. No aneurysm. No adenopathy. Reproductive: Probable small fibroid noted within the uterus. Uterus otherwise unremarkable. Ovaries within normal limits. Other: No free intraperitoneal air. Small volume free fluid within the upper abdomen. Musculoskeletal: No acute osseous abnormality. No worrisome lytic or blastic osseous lesions. IMPRESSION: 1. Hazy inflammatory stranding with free fluid about the pancreas, suggesting acute pancreatitis. Correlation with serum lipase recommended. 2. Cholelithiasis with dilatation of the common bile duct up to 14 mm. Question intraluminal fluid density within the distal common bile duct, raising the possibility for possible choledocholithiasis. This is suspected to reflect the underlying etiology for acute pancreatitis. Gallbladder itself also appears somewhat inflamed with associated mucosal enhancement. This could be further assessed with dedicated right upper quadrant ultrasound. 3. Colonic diverticulosis without evidence for acute diverticulitis. 4. Fibroid uterus. 5. Severe hepatic steatosis. 6. Advanced aorto bi-iliac atherosclerotic disease. 7. 11 mm left adrenal nodule, indeterminate. Further evaluation with dedicated adrenal mass protocol CT and/or MRI suggested. This could be performed on a nonemergent basis. Electronically Signed   By: Rise MuBenjamin  McClintock M.D.   On: 01/30/2017 01:10        Scheduled Meds: . enoxaparin (LOVENOX) injection  40 mg Subcutaneous Q24H  . folic acid  1 mg Oral Daily  .  gabapentin  600 mg Oral TID  . iopamidol      . multivitamin with minerals  1 tablet Oral Daily  . nicotine  14 mg Transdermal Once  . nicotine  14 mg Transdermal Daily  . potassium chloride (KCL MULTIRUN) 30 mEq in 265 mL IVPB  30 mEq Intravenous Once  . sodium chloride      . thiamine  100 mg Oral Daily   Or  . thiamine  100 mg Intravenous Daily   Continuous Infusions: . dextrose 5 % and 0.45% NaCl 100 mL/hr at 01/30/17 1007     LOS: 0 days       Juanangel Soderholm Annett Gulaaniel Prajwal Fellner, MD [facility] Pager [phone]  If 7PM-7AM, please contact night-coverage [email or web address] Password [email or web address] 01/30/2017, 10:15 AM

## 2017-01-30 NOTE — Progress Notes (Signed)
Alerted MD to K+ 2.5 at this time. Will await response.

## 2017-01-30 NOTE — ED Notes (Signed)
Pt returned from CT °

## 2017-01-30 NOTE — ED Notes (Signed)
Hospitalist at bedside 

## 2017-01-31 ENCOUNTER — Inpatient Hospital Stay (HOSPITAL_COMMUNITY): Payer: PPO

## 2017-01-31 DIAGNOSIS — N390 Urinary tract infection, site not specified: Secondary | ICD-10-CM

## 2017-01-31 LAB — CBC WITH DIFFERENTIAL/PLATELET
BASOS ABS: 0 10*3/uL (ref 0.0–0.1)
BASOS PCT: 0 %
EOS ABS: 0.1 10*3/uL (ref 0.0–0.7)
EOS PCT: 1 %
HCT: 26.4 % — ABNORMAL LOW (ref 36.0–46.0)
Hemoglobin: 8.7 g/dL — ABNORMAL LOW (ref 12.0–15.0)
LYMPHS ABS: 1 10*3/uL (ref 0.7–4.0)
Lymphocytes Relative: 12 %
MCH: 35.8 pg — ABNORMAL HIGH (ref 26.0–34.0)
MCHC: 33 g/dL (ref 30.0–36.0)
MCV: 108.6 fL — ABNORMAL HIGH (ref 78.0–100.0)
MONO ABS: 0.8 10*3/uL (ref 0.1–1.0)
Monocytes Relative: 10 %
NEUTROS ABS: 6.3 10*3/uL (ref 1.7–7.7)
NEUTROS PCT: 77 %
PLATELETS: 144 10*3/uL — AB (ref 150–400)
RBC: 2.43 MIL/uL — AB (ref 3.87–5.11)
RDW: 18.6 % — AB (ref 11.5–15.5)
WBC: 8.2 10*3/uL (ref 4.0–10.5)

## 2017-01-31 LAB — BASIC METABOLIC PANEL
ANION GAP: 8 (ref 5–15)
ANION GAP: 8 (ref 5–15)
BUN: 7 mg/dL (ref 6–20)
BUN: 5 mg/dL — ABNORMAL LOW (ref 6–20)
CALCIUM: 8.4 mg/dL — AB (ref 8.9–10.3)
CHLORIDE: 104 mmol/L (ref 101–111)
CO2: 24 mmol/L (ref 22–32)
CO2: 25 mmol/L (ref 22–32)
CREATININE: 0.45 mg/dL (ref 0.44–1.00)
Calcium: 8.1 mg/dL — ABNORMAL LOW (ref 8.9–10.3)
Chloride: 105 mmol/L (ref 101–111)
Creatinine, Ser: 0.47 mg/dL (ref 0.44–1.00)
GFR calc non Af Amer: >60 mL/min (ref >60)
GFR calc non Af Amer: >60 mL/min (ref >60)
Glucose, Bld: 125 mg/dL — ABNORMAL HIGH (ref 65–99)
Glucose, Bld: 128 mg/dL — ABNORMAL HIGH (ref 65–99)
POTASSIUM: 2.7 mmol/L — AB (ref 3.5–5.1)
POTASSIUM: 3.1 mmol/L — AB (ref 3.5–5.1)
SODIUM: 137 mmol/L (ref 135–145)
SODIUM: 137 mmol/L (ref 135–145)

## 2017-01-31 LAB — LIPASE, BLOOD: LIPASE: 164 U/L — AB (ref 11–51)

## 2017-01-31 MED ORDER — GADOBENATE DIMEGLUMINE 529 MG/ML IV SOLN
11.0000 mL | Freq: Once | INTRAVENOUS | Status: AC | PRN
  Administered 2017-01-31: 11 mL via INTRAVENOUS

## 2017-01-31 MED ORDER — CIPROFLOXACIN IN D5W 200 MG/100ML IV SOLN
200.0000 mg | Freq: Two times a day (BID) | INTRAVENOUS | Status: DC
  Administered 2017-01-31 (×2): 200 mg via INTRAVENOUS
  Filled 2017-01-31 (×3): qty 100

## 2017-01-31 MED ORDER — MAGNESIUM SULFATE 2 GM/50ML IV SOLN
2.0000 g | Freq: Once | INTRAVENOUS | Status: AC
Start: 2017-01-31 — End: 2017-01-31
  Administered 2017-01-31: 2 g via INTRAVENOUS
  Filled 2017-01-31: qty 50

## 2017-01-31 MED ORDER — SODIUM CHLORIDE 0.9 % IV SOLN
30.0000 meq | INTRAVENOUS | Status: AC
  Administered 2017-01-31 (×2): 30 meq via INTRAVENOUS
  Filled 2017-01-31 (×2): qty 15

## 2017-01-31 MED ORDER — NICOTINE 21 MG/24HR TD PT24
21.0000 mg | MEDICATED_PATCH | Freq: Every day | TRANSDERMAL | Status: DC
  Administered 2017-01-31 – 2017-02-06 (×7): 21 mg via TRANSDERMAL
  Filled 2017-01-31 (×7): qty 1

## 2017-01-31 NOTE — Progress Notes (Signed)
Text MD K+ 2.7 this morning. Will await new orders.

## 2017-01-31 NOTE — Progress Notes (Signed)
PROGRESS NOTE    April [name]  QMV:[id number] DOB: June 05, 1946 DOA: 01/29/2017 PCP: Lilia Argue    Brief Narrative:  71 yo female with past medical history of etho abuse, presents with abdominal pain for the last 5 days, epigastric and constant, associated with vomiting. On the initial physical examination, hemodynamically stable, with positive abdominal pain. Lipase 1,692. CT abdomen with pancreatitis with common bile duct dilatation with possible choledocholithiasis. Admitted for further treatment. Follow on MRCP.   Assessment & Plan:   Principal Problem:   Gallstone pancreatitis Active Problems:   Alcohol abuse   Hypokalemia   Macrocytic anemia   Metabolic acidosis  1. Acute gallstone pancreatitis. Pain controlled with as needed morphine, continue IV fluids and antiemetics. Will order MRCP stat due to risk of cholecystitis, may need ERCP. Abdominal US with gallstones, if negative MRCP, will consult surgery to plan cholecystectomy.   2. Common bile duct dilatation. Pain controlled, no fever or chills, will check bilirubins in am, continue NPO, follow on MRCP, may need ERCP. Continue pain control and supportive IV fluids.   3. Hypokalemia and hypomagnesemia. K at 2,7, will continue repletion with kcl 30 meq x2 doses, and magnesium sulfate 2 grams, will follow on electrolytes this pm. Target K at 4 and Mg at 2. Renal function with cr at 0.47 with normal serum bicarbonate.   4. Etho dependence. Patient required lorazepam twice yesterday, on exam no agitation or tremors, will increase dose of nicotine patch. Continue thiamine, folic acid and multivitamins.   5. Tobacco abuse. Smoking cessation, continue nicotine patch, will increase dose to 21 to avoid withdrawal symptoms.   6. Suspected new urine infection (likely present on admission). Patient with increase urinary frequency, lower abdominal pain and urine with strong odor, will start empiric ciprofloxacin and will  follow on urine culture, note ua with no significant pyuria.    DVT prophylaxis: enoxaparin  Code Status: DNR  Family Communication: No family at the bedside Disposition Plan: Home  Consultants:     Procedures:   Antimicrobials:       Subjective: Patient with increased appetite, no nausea or vomiting. Persistent abdominal pain, improved with pain medications, patient still NPO.   Objective: Vitals:   01/30/17 1346 01/30/17 2133 01/31/17 0145 01/31/17 0637  BP: 137/73 116/72  112/67  Pulse: 92 72  89  Resp: 18 18  19   Temp: 99 F (37.2 C) 98.9 F (37.2 C) 99.1 F (37.3 C) 99.5 F (37.5 C)  TempSrc: Oral Oral Oral Oral  SpO2: 95% 94%  90%  Weight:      Height:        Intake/Output Summary (Last 24 hours) at 01/31/17 0931 Last data filed at 01/31/17 0532  Gross per 24 hour  Intake          2293.33 ml  Output                0 ml  Net          2293.33 ml   Filed Weights   01/29/17 2053 01/30/17 0311  Weight: 56.7 kg (125 lb) 55.7 kg (122 lb 12.7 oz)    Examination:  General exam: deconditioned E ENT: positive pallor, no icterus, oral mucosa dry.  Respiratory system: decreased breath sounds at bases, no wheezing, rales or rhonchi. Respiratory effort normal. Cardiovascular system: S1 & S2 heard, RRR. No JVD, murmurs, rubs, gallops or clicks. No pedal edema. Gastrointestinal system: Abdomen is distended, tender to deep palpation, soft.  No  organomegaly or masses felt. Normal bowel sounds heard. Central nervous system: Alert and oriented. No focal neurological deficits. Extremities: Symmetric 5 x 5 power. Skin: No rashes, lesions or ulcers      Data Reviewed: I have personally reviewed following labs and imaging studies  CBC:  Recent Labs Lab 01/29/17 2101 01/30/17 0550 01/31/17 0533  WBC 9.0 7.8 8.2  NEUTROABS  --   --  6.3  HGB 10.6* 8.8* 8.7*  HCT 32.1* 25.8* 26.4*  MCV 108.8* 108.4* 108.6*  PLT 206 162 144*   Basic Metabolic  Panel:  Recent Labs Lab 01/29/17 2101 01/30/17 0550 01/30/17 1502 01/31/17 0533  NA 139 143 139 137  K 3.3* 2.6* 2.5* 2.7*  CL 105 113* 107 105  CO2 16* 22 23 24   GLUCOSE 201* 94 97 125*  BUN 17 13 9 7   CREATININE 0.85 0.54 0.45 0.47  CALCIUM 9.8 8.6* 8.8* 8.4*  MG  --  1.5*  --   --    GFR: Estimated Creatinine Clearance: 58.4 mL/min (by C-G formula based on SCr of 0.47 mg/dL). Liver Function Tests:  Recent Labs Lab 01/29/17 2101 01/30/17 0550  AST 85* 50*  ALT 30 21  ALKPHOS 103 81  BILITOT 3.1* 1.4*  PROT 7.2 5.5*  ALBUMIN 4.4 3.2*    Recent Labs Lab 01/29/17 2101 01/31/17 0533  LIPASE 1,692* 164*   No results for input(s): AMMONIA in the last 168 hours. Coagulation Profile:  Recent Labs Lab 01/29/17 2358  INR 0.95   Cardiac Enzymes: No results for input(s): CKTOTAL, CKMB, CKMBINDEX, TROPONINI in the last 168 hours. BNP (last 3 results) No results for input(s): PROBNP in the last 8760 hours. HbA1C: No results for input(s): HGBA1C in the last 72 hours. CBG: No results for input(s): GLUCAP in the last 168 hours. Lipid Profile: No results for input(s): CHOL, HDL, LDLCALC, TRIG, CHOLHDL, LDLDIRECT in the last 72 hours. Thyroid Function Tests: No results for input(s): TSH, T4TOTAL, FREET4, T3FREE, THYROIDAB in the last 72 hours. Anemia Panel:  Recent Labs  01/30/17 0550  VITAMINB12 294   Sepsis Labs: No results for input(s): PROCALCITON, LATICACIDVEN in the last 168 hours.  No results found for this or any previous visit (from the past 240 hour(s)).       Radiology Studies: Ct Head Wo Contrast  Result Date: 01/30/2017 CLINICAL DATA:  Initial evaluation for acute trauma, fall. EXAM: CT HEAD WITHOUT CONTRAST CT CERVICAL SPINE WITHOUT CONTRAST TECHNIQUE: Multidetector CT imaging of the head and cervical spine was performed following the standard protocol without intravenous contrast. Multiplanar CT image reconstructions of the cervical spine  were also generated. COMPARISON:  Prior CT from 02/24/2016. FINDINGS: CT HEAD FINDINGS Brain: Generalized age-related cerebral atrophy with mild to moderate chronic microvascular ischemic disease. No acute intracranial hemorrhage. No evidence for acute large vessel territory infarct. No mass lesion, midline shift or mass effect. No hydrocephalus. No extra-axial fluid collection. Vascular: No hyperdense vessel. Scattered vascular calcifications noted within the carotid siphons. Skull: Scalp soft tissues demonstrate no acute abnormality. Calvarium intact. Sinuses/Orbits: Globes and orbital soft tissues within normal limits. Small fluid level noted within the left maxillary sinus. Visualized paranasal sinuses are otherwise largely clear. No mastoid effusion. Degenerative changes noted about the temporomandibular joints bilaterally. CT CERVICAL SPINE FINDINGS Alignment: Straightening of the normal cervical lordosis. No listhesis. Skull base and vertebrae: The skullbase intact. Normal C1-2 articulations are preserved. Dens intact. Vertebral body heights are maintained. The bone no acute fracture. Soft tissues and  spinal canal: The visualized soft tissues of the neck demonstrate no acute abnormality. No prevertebral edema. Prominent vascular calcifications present about the carotid bifurcations. Disc levels: Moderate degenerative spondylolysis present at C5-6 and C6-7. The the the prominent left-sided facet arthrosis at C3-4. Upper chest: Visualized upper mediastinum unremarkable. Emphysematous changes noted within the visualized lung apices. IMPRESSION: CT BRAIN: 1. No acute intracranial process identified. 2. Generalized age-related cerebral atrophy with mild chronic small vessel ischemic disease. 3. Small fluid level within the left maxillary sinus, suggesting acute sinusitis. CT CERVICAL SPINE: 1. No acute traumatic injury within the cervical spine. 2. Moderate degenerative spondylolysis at C5-6 and C6-7. 3.  Emphysema. Electronically Signed   By: Rise Mu M.D.   On: 01/30/2017 00:52   Ct Cervical Spine Wo Contrast  Result Date: 01/30/2017 CLINICAL DATA:  Initial evaluation for acute trauma, fall. EXAM: CT HEAD WITHOUT CONTRAST CT CERVICAL SPINE WITHOUT CONTRAST TECHNIQUE: Multidetector CT imaging of the head and cervical spine was performed following the standard protocol without intravenous contrast. Multiplanar CT image reconstructions of the cervical spine were also generated. COMPARISON:  Prior CT from 02/24/2016. FINDINGS: CT HEAD FINDINGS Brain: Generalized age-related cerebral atrophy with mild to moderate chronic microvascular ischemic disease. No acute intracranial hemorrhage. No evidence for acute large vessel territory infarct. No mass lesion, midline shift or mass effect. No hydrocephalus. No extra-axial fluid collection. Vascular: No hyperdense vessel. Scattered vascular calcifications noted within the carotid siphons. Skull: Scalp soft tissues demonstrate no acute abnormality. Calvarium intact. Sinuses/Orbits: Globes and orbital soft tissues within normal limits. Small fluid level noted within the left maxillary sinus. Visualized paranasal sinuses are otherwise largely clear. No mastoid effusion. Degenerative changes noted about the temporomandibular joints bilaterally. CT CERVICAL SPINE FINDINGS Alignment: Straightening of the normal cervical lordosis. No listhesis. Skull base and vertebrae: The skullbase intact. Normal C1-2 articulations are preserved. Dens intact. Vertebral body heights are maintained. The bone no acute fracture. Soft tissues and spinal canal: The visualized soft tissues of the neck demonstrate no acute abnormality. No prevertebral edema. Prominent vascular calcifications present about the carotid bifurcations. Disc levels: Moderate degenerative spondylolysis present at C5-6 and C6-7. The the the prominent left-sided facet arthrosis at C3-4. Upper chest: Visualized upper  mediastinum unremarkable. Emphysematous changes noted within the visualized lung apices. IMPRESSION: CT BRAIN: 1. No acute intracranial process identified. 2. Generalized age-related cerebral atrophy with mild chronic small vessel ischemic disease. 3. Small fluid level within the left maxillary sinus, suggesting acute sinusitis. CT CERVICAL SPINE: 1. No acute traumatic injury within the cervical spine. 2. Moderate degenerative spondylolysis at C5-6 and C6-7. 3. Emphysema. Electronically Signed   By: Rise Mu M.D.   On: 01/30/2017 00:52   Ct Abdomen Pelvis W Contrast  Result Date: 01/30/2017 CLINICAL DATA:  Initial evaluation for acute nausea, vomiting, diarrhea. EXAM: CT ABDOMEN AND PELVIS WITH CONTRAST TECHNIQUE: Multidetector CT imaging of the abdomen and pelvis was performed using the standard protocol following bolus administration of intravenous contrast. CONTRAST:  ISOVUE-300 IOPAMIDOL (ISOVUE-300) INJECTION 61% COMPARISON:  None. FINDINGS: Lower chest: Scattered atelectatic changes present within the visualized lung bases. Visualized lungs are otherwise clear. Hepatobiliary: Diffuse hypoattenuation liver consistent with steatosis. Liver otherwise unremarkable. Gallbladder largely contracted. Calcification within the gallbladder with likely reflects a stone. There is mild mucosal enhancement about the gallbladder itself. Common bile duct is dilated up to 14 mm. There is question of possible intraluminal density within the distal common bile duct (series 2, image 34), which may reflect choledocholithiasis. Pancreas:  Hazy inflammatory stranding about the pancreas, most prevalent about the pancreatic head and uncinate process, with additional stranding near the pancreatic tail. Findings suggestive of possible acute pancreatitis. This may be related to gallstones given the findings within the gallbladder and common bile duct. No loculated fluid collection. No findings to suggest pancreatic  necrosis. Spleen: Spleen within normal limits. Adrenals/Urinary Tract: 11 mm nodule at the medial limb of the left adrenal gland, indeterminate. Adrenal glands otherwise unremarkable. Kidneys equal size with symmetric enhancement. No nephrolithiasis, hydronephrosis, or focal enhancing renal mass. No hydroureter. Bladder within normal limits. Stomach/Bowel: Stomach within normal limits. Mild hazy stranding about the duodenum, felt to be secondary due to the inflammatory changes about the adjacent pancreas. No evidence for bowel obstruction. Colonic diverticulosis without evidence for acute diverticulitis. Intraluminal fluid density with mild mucosal enhancement noted about the ascending colon, suspected to be secondary in nature due to the inflammatory changes within the upper abdomen. No other significant inflammatory changes about the bowels. No findings to suggest acute appendicitis. Vascular/Lymphatic: Advanced aorto bi-iliac atherosclerotic disease. No aneurysm. No adenopathy. Reproductive: Probable small fibroid noted within the uterus. Uterus otherwise unremarkable. Ovaries within normal limits. Other: No free intraperitoneal air. Small volume free fluid within the upper abdomen. Musculoskeletal: No acute osseous abnormality. No worrisome lytic or blastic osseous lesions. IMPRESSION: 1. Hazy inflammatory stranding with free fluid about the pancreas, suggesting acute pancreatitis. Correlation with serum lipase recommended. 2. Cholelithiasis with dilatation of the common bile duct up to 14 mm. Question intraluminal fluid density within the distal common bile duct, raising the possibility for possible choledocholithiasis. This is suspected to reflect the underlying etiology for acute pancreatitis. Gallbladder itself also appears somewhat inflamed with associated mucosal enhancement. This could be further assessed with dedicated right upper quadrant ultrasound. 3. Colonic diverticulosis without evidence for acute  diverticulitis. 4. Fibroid uterus. 5. Severe hepatic steatosis. 6. Advanced aorto bi-iliac atherosclerotic disease. 7. 11 mm left adrenal nodule, indeterminate. Further evaluation with dedicated adrenal mass protocol CT and/or MRI suggested. This could be performed on a nonemergent basis. Electronically Signed   By: Rise Mu M.D.   On: 01/30/2017 01:10   US Abdomen Limited Ruq  Result Date: 01/30/2017 CLINICAL DATA:  Cholelithiasis, abnormal CT, pain, vomiting EXAM: US ABDOMEN LIMITED - RIGHT UPPER QUADRANT COMPARISON:  CT abdomen/pelvis 01/30/2017 FINDINGS: Gallbladder: Shadowing gallstones in gallbladder up to 10 mm diameter. Gallbladder contracted. Wall suboptimally imaged. Mild tenderness in the region of the gallbladder with imaging. No pericholecystic fluid. Common bile duct: Diameter: Dilated 8 mm diameter at the porta hepatis, dilating to 14 mm more distally. Distal CBD is not imaged ; the questioned distal common duct stones identified on CT are unable to be assessed sonographically due to bowel gas. Liver: Echogenic, secondary to fatty infiltration as seen on prior CT. Hepatopetal portal venous flow. No definite hepatic mass. No RIGHT upper quadrant free fluid. IMPRESSION: Contracted gallbladder with multiple gallstones. Fatty infiltration of liver. Extrahepatic biliary dilatation, though the distal CBD and potential choledocholithiasis identified on CT are obscured by bowel gas ; if clinically indicated these could be better assessed by MRCP imaging. Electronically Signed   By: Ulyses Southward M.D.   On: 01/30/2017 13:55        Scheduled Meds: . enoxaparin (LOVENOX) injection  40 mg Subcutaneous Q24H  . folic acid  1 mg Oral Daily  . gabapentin  600 mg Oral TID  . multivitamin with minerals  1 tablet Oral Daily  . nicotine  14  mg Transdermal Daily  . potassium chloride (KCL MULTIRUN) 30 mEq in 265 mL IVPB  30 mEq Intravenous Q4H  . thiamine  100 mg Oral Daily   Or  . thiamine   100 mg Intravenous Daily   Continuous Infusions: . dextrose 5 % and 0.45% NaCl 100 mL/hr at 01/31/17 0532     LOS: 1 day     Olden Klauer Annett Gula, MD [facility] Pager [phone]  If 7PM-7AM, please contact night-coverage [email or web address] Password [email or web address] 01/31/2017, 9:31 AM

## 2017-02-01 ENCOUNTER — Inpatient Hospital Stay (HOSPITAL_COMMUNITY): Payer: PPO | Admitting: Anesthesiology

## 2017-02-01 ENCOUNTER — Encounter (HOSPITAL_COMMUNITY): Payer: Self-pay

## 2017-02-01 ENCOUNTER — Encounter (HOSPITAL_COMMUNITY)
Admission: EM | Disposition: A | Discharge status: Home Health | Payer: Self-pay | Source: Home / Self Care | Attending: Internal Medicine

## 2017-02-01 ENCOUNTER — Inpatient Hospital Stay (HOSPITAL_COMMUNITY): Payer: PPO

## 2017-02-01 HISTORY — PX: ERCP: SHX5425

## 2017-02-01 LAB — COMPREHENSIVE METABOLIC PANEL
ALT: 18 U/L (ref 14–54)
AST: 46 U/L — ABNORMAL HIGH (ref 15–41)
Albumin: 2.8 g/dL — ABNORMAL LOW (ref 3.5–5.0)
Alkaline Phosphatase: 77 U/L (ref 38–126)
Anion gap: 8 (ref 5–15)
CHLORIDE: 100 mmol/L — AB (ref 101–111)
CO2: 26 mmol/L (ref 22–32)
CREATININE: 0.34 mg/dL — AB (ref 0.44–1.00)
Calcium: 8 mg/dL — ABNORMAL LOW (ref 8.9–10.3)
GFR calc Af Amer: >60 mL/min (ref >60)
Glucose, Bld: 155 mg/dL — ABNORMAL HIGH (ref 65–99)
Potassium: 2.3 mmol/L — CL (ref 3.5–5.1)
Sodium: 134 mmol/L — ABNORMAL LOW (ref 135–145)
TOTAL PROTEIN: 5.1 g/dL — AB (ref 6.5–8.1)
Total Bilirubin: 0.4 mg/dL (ref 0.3–1.2)

## 2017-02-01 LAB — BASIC METABOLIC PANEL
ANION GAP: 9 (ref 5–15)
BUN: <5 mg/dL — ABNORMAL LOW (ref 6–20)
CALCIUM: 8.2 mg/dL — AB (ref 8.9–10.3)
CO2: 28 mmol/L (ref 22–32)
Chloride: 99 mmol/L — ABNORMAL LOW (ref 101–111)
Creatinine, Ser: <0.3 mg/dL — ABNORMAL LOW (ref 0.44–1.00)
Glucose, Bld: 143 mg/dL — ABNORMAL HIGH (ref 65–99)
POTASSIUM: 2.6 mmol/L — AB (ref 3.5–5.1)
SODIUM: 136 mmol/L (ref 135–145)

## 2017-02-01 LAB — CBC WITH DIFFERENTIAL/PLATELET
BASOS ABS: 0 10*3/uL (ref 0.0–0.1)
BASOS PCT: 0 %
EOS ABS: 0.1 10*3/uL (ref 0.0–0.7)
EOS PCT: 1 %
HCT: 23.8 % — ABNORMAL LOW (ref 36.0–46.0)
HEMOGLOBIN: 8 g/dL — AB (ref 12.0–15.0)
LYMPHS ABS: 0.9 10*3/uL (ref 0.7–4.0)
Lymphocytes Relative: 15 %
MCH: 35.9 pg — AB (ref 26.0–34.0)
MCHC: 33.6 g/dL (ref 30.0–36.0)
MCV: 106.7 fL — ABNORMAL HIGH (ref 78.0–100.0)
Monocytes Absolute: 0.7 10*3/uL (ref 0.1–1.0)
Monocytes Relative: 12 %
Neutro Abs: 4.1 10*3/uL (ref 1.7–7.7)
Neutrophils Relative %: 72 %
PLATELETS: 138 10*3/uL — AB (ref 150–400)
RBC: 2.23 MIL/uL — AB (ref 3.87–5.11)
RDW: 18.6 % — ABNORMAL HIGH (ref 11.5–15.5)
WBC: 5.8 10*3/uL (ref 4.0–10.5)

## 2017-02-01 LAB — FOLATE RBC
FOLATE, HEMOLYSATE: 283.1 ng/mL
Folate, RBC: 1068 ng/mL (ref >498)
HEMATOCRIT: 26.5 % — AB (ref 34.0–46.6)

## 2017-02-01 LAB — MAGNESIUM: MAGNESIUM: 1.4 mg/dL — AB (ref 1.7–2.4)

## 2017-02-01 SURGERY — ERCP, WITH INTERVENTION IF INDICATED
Anesthesia: Monitor Anesthesia Care

## 2017-02-01 SURGERY — ERCP, WITH INTERVENTION IF INDICATED
Anesthesia: General

## 2017-02-01 MED ORDER — MAGNESIUM SULFATE IN D5W 1-5 GM/100ML-% IV SOLN
1.0000 g | Freq: Once | INTRAVENOUS | Status: AC
  Administered 2017-02-01: 1 g via INTRAVENOUS
  Filled 2017-02-01: qty 100

## 2017-02-01 MED ORDER — SODIUM CHLORIDE 0.9 % IV SOLN
30.0000 meq | Freq: Once | INTRAVENOUS | Status: AC
  Administered 2017-02-01: 30 meq via INTRAVENOUS
  Filled 2017-02-01: qty 15

## 2017-02-01 MED ORDER — IOPAMIDOL (ISOVUE-370) INJECTION 76%
INTRAVENOUS | Status: DC | PRN
  Administered 2017-02-01: 80 mL

## 2017-02-01 MED ORDER — PHENYLEPHRINE HCL 10 MG/ML IJ SOLN
INTRAVENOUS | Status: DC | PRN
  Administered 2017-02-01: 30 ug/min via INTRAVENOUS

## 2017-02-01 MED ORDER — PHENYLEPHRINE 40 MCG/ML (10ML) SYRINGE FOR IV PUSH (FOR BLOOD PRESSURE SUPPORT)
PREFILLED_SYRINGE | INTRAVENOUS | Status: AC
  Filled 2017-02-01: qty 10

## 2017-02-01 MED ORDER — INDOMETHACIN 50 MG RE SUPP
RECTAL | Status: AC
  Filled 2017-02-01: qty 2

## 2017-02-01 MED ORDER — LACTATED RINGERS IV SOLN
INTRAVENOUS | Status: DC
  Administered 2017-02-01 (×2): via INTRAVENOUS

## 2017-02-01 MED ORDER — FENTANYL CITRATE (PF) 100 MCG/2ML IJ SOLN
INTRAMUSCULAR | Status: DC | PRN
  Administered 2017-02-01: 100 ug via INTRAVENOUS

## 2017-02-01 MED ORDER — PHENYLEPHRINE 40 MCG/ML (10ML) SYRINGE FOR IV PUSH (FOR BLOOD PRESSURE SUPPORT)
PREFILLED_SYRINGE | INTRAVENOUS | Status: DC | PRN
  Administered 2017-02-01 (×2): 80 ug via INTRAVENOUS
  Administered 2017-02-01 (×2): 120 ug via INTRAVENOUS
  Administered 2017-02-01: 80 ug via INTRAVENOUS
  Administered 2017-02-01 (×4): 120 ug via INTRAVENOUS
  Administered 2017-02-01: 160 ug via INTRAVENOUS

## 2017-02-01 MED ORDER — CIPROFLOXACIN IN D5W 400 MG/200ML IV SOLN
INTRAVENOUS | Status: AC
  Filled 2017-02-01: qty 200

## 2017-02-01 MED ORDER — POTASSIUM CHLORIDE CRYS ER 20 MEQ PO TBCR
40.0000 meq | EXTENDED_RELEASE_TABLET | ORAL | Status: AC
  Administered 2017-02-01 (×2): 40 meq via ORAL
  Filled 2017-02-01 (×2): qty 2

## 2017-02-01 MED ORDER — ONDANSETRON HCL 4 MG/2ML IJ SOLN
INTRAMUSCULAR | Status: AC
  Filled 2017-02-01: qty 2

## 2017-02-01 MED ORDER — POTASSIUM CHLORIDE CRYS ER 20 MEQ PO TBCR: 40.0000 meq | EXTENDED_RELEASE_TABLET | Freq: Two times a day (BID) | ORAL | Status: DC

## 2017-02-01 MED ORDER — SUCCINYLCHOLINE CHLORIDE 20 MG/ML IJ SOLN
INTRAMUSCULAR | Status: DC | PRN
  Administered 2017-02-01: 80 mg via INTRAVENOUS

## 2017-02-01 MED ORDER — CIPROFLOXACIN IN D5W 400 MG/200ML IV SOLN
400.0000 mg | Freq: Two times a day (BID) | INTRAVENOUS | Status: DC
  Administered 2017-02-01 – 2017-02-05 (×9): 400 mg via INTRAVENOUS
  Filled 2017-02-01 (×8): qty 200

## 2017-02-01 MED ORDER — PHENYLEPHRINE 40 MCG/ML (10ML) SYRINGE FOR IV PUSH (FOR BLOOD PRESSURE SUPPORT)
PREFILLED_SYRINGE | INTRAVENOUS | Status: AC
  Filled 2017-02-01: qty 20

## 2017-02-01 MED ORDER — DEXMEDETOMIDINE HCL IN NACL 200 MCG/50ML IV SOLN
INTRAVENOUS | Status: AC
  Filled 2017-02-01: qty 100

## 2017-02-01 MED ORDER — EPHEDRINE 5 MG/ML INJ
INTRAVENOUS | Status: AC
  Filled 2017-02-01: qty 10

## 2017-02-01 MED ORDER — SODIUM CHLORIDE 0.9 % IV SOLN: INTRAVENOUS | Status: DC

## 2017-02-01 MED ORDER — FENTANYL CITRATE (PF) 100 MCG/2ML IJ SOLN
INTRAMUSCULAR | Status: AC
  Filled 2017-02-01: qty 2

## 2017-02-01 MED ORDER — LIDOCAINE 2% (20 MG/ML) 5 ML SYRINGE
INTRAMUSCULAR | Status: DC | PRN
  Administered 2017-02-01: 50 mg via INTRAVENOUS

## 2017-02-01 MED ORDER — EPHEDRINE SULFATE-NACL 50-0.9 MG/10ML-% IV SOSY
PREFILLED_SYRINGE | INTRAVENOUS | Status: DC | PRN
  Administered 2017-02-01: 10 mg via INTRAVENOUS

## 2017-02-01 MED ORDER — PROPOFOL 10 MG/ML IV BOLUS
INTRAVENOUS | Status: DC | PRN
  Administered 2017-02-01: 80 mg via INTRAVENOUS

## 2017-02-01 MED ORDER — GLUCAGON HCL RDNA (DIAGNOSTIC) 1 MG IJ SOLR
INTRAMUSCULAR | Status: AC
  Filled 2017-02-01: qty 1

## 2017-02-01 NOTE — Progress Notes (Signed)
CRITICAL VALUE ALERT  Critical value received:  K 2.6  Date of notification:  02/01/2017  Time of notification:  1205  Critical value read back:Yes.    Nurse who received alert:  Norma FredricksonBableen  MD notified (1st page):  Arrien  Time of first page:  1209  MD notified (2nd page):  Time of second page:  Responding MD:    Time MD responded:

## 2017-02-01 NOTE — Consult Note (Signed)
[facility] CONSULT Reason for consult: CBD stones Referring Physician: [facility] hospital[facility]  April [name] is an 71 y.o. female.  HPI: she has a history of alcohol dependence. She had had abdominal pain for several days. A lot of the history is obtained from her daughter who is in the room with her today. The patient apparently drank several glasses of brandy a day that is not clearly had any problems related to that. She came into the ER with epigastric pain and vomiting. She had fallen because of weakness and poor PO intake. Was evaluated with head CT etc. Lipase was 1700 now almost normal. Patient clinically feeling much better minimal pain. Total bilirubin 3.1 now down to 0.4. CT showed pancreatitis question the CBD stone. Ultrasound confirmed gallstones. MRCP showed stones in the CBD without obstruction. Patient feels fairly good here this morning.  Past Medical History:  Diagnosis Date  . Anxiety   . Current smoker   . Depression   . ETOH abuse   . History of shingles summer 2015  . HTN (hypertension)   . Hyperlipidemia   . Hypertension   . Leg edema   . Neuropathy [facility])     Past Surgical History:  Procedure Laterality Date  . removal of uterine fibroids  2004    Family History  Problem Relation Age of Onset  . Heart disease Father   . Neuropathy Neg Hx     Social History:  reports that she has been smoking Cigarettes.  She has been smoking about 1.00 pack per day. She has never used smokeless tobacco. She reports that she drinks about 0.6 oz of alcohol per week . She reports that she does not use drugs.  Allergies:  Allergies  Allergen Reactions  . Naltrexone Shortness Of Breath  . Penicillins Rash    Has patient had a PCN reaction causing immediate rash, facial/tongue/throat swelling, SOB or lightheadedness with hypotension: Yes immediate rash  Has patient had a PCN reaction causing severe rash involving mucus membranes or skin necrosis: Yes Has patient  had a PCN reaction that required hospitalization": No Has patient had a PCN reaction occurring within the last 10 years: Yes If all of the above answers are "NO", then may proceed with Cephalosporin use.     Medications; Prior to Admission medications   Medication Sig Start Date End Date Taking? Authorizing Provider  amitriptyline (ELAVIL) 25 MG tablet Take 25 mg by mouth at bedtime as needed for sleep.  08/27/16  Yes Historical Provider, MD  clonazePAM (KLONOPIN) 0.5 MG tablet Take 0.5 mg by mouth 2 (two) times daily as needed for anxiety.   Yes Historical Provider, MD  gabapentin (NEURONTIN) 300 MG capsule Take 2 capsules (600 mg total) by mouth 3 (three) times daily. 10/29/16  Yes Melvenia Beam, MD  Vitamin D, Ergocalciferol, (DRISDOL) 50000 units CAPS capsule Take 50,000 Units by mouth once a week. 07/31/16  Yes Historical Provider, MD  Aspirin-Acetaminophen-Caffeine (GOODY HEADACHE PO) Take 1 each by mouth daily as needed (headache).    Historical Provider, MD   . ciprofloxacin  200 mg Intravenous Q12H  . enoxaparin (LOVENOX) injection  40 mg Subcutaneous Q24H  . folic acid  1 mg Oral Daily  . gabapentin  600 mg Oral TID  . magnesium sulfate 1 - 4 g bolus IVPB  1 g Intravenous Once  . multivitamin with minerals  1 tablet Oral Daily  . nicotine  21 mg Transdermal Daily  . potassium chloride (KCL MULTIRUN) 30  mEq in 265 mL IVPB  30 mEq Intravenous Once  . potassium chloride  40 mEq Oral BID  . thiamine  100 mg Oral Daily   Or  . thiamine  100 mg Intravenous Daily   PRN Meds LORazepam **OR** LORazepam, morphine injection, ondansetron **OR** ondansetron (ZOFRAN) IV Results for orders placed or performed during the hospital encounter of 01/29/17 (from the past 48 hour(s))  Basic metabolic panel     Status: Abnormal   Collection Time: 01/30/17  3:02 PM  Result Value Ref Range   Sodium 139 135 - 145 mmol/L   Potassium 2.5 (LL) 3.5 - 5.1 mmol/L    Comment: CRITICAL RESULT CALLED TO,  READ BACK BY AND VERIFIED WITH: WILLARD,W AT 4:25PM ON 01/30/17 BY FESTERMAN,C    Chloride 107 101 - 111 mmol/L   CO2 23 22 - 32 mmol/L   Glucose, Bld 97 65 - 99 mg/dL   BUN 9 6 - 20 mg/dL   Creatinine, Ser 0.45 0.44 - 1.00 mg/dL   Calcium 8.8 (L) 8.9 - 10.3 mg/dL   GFR calc non Af Amer >60 >60 mL/min   GFR calc Af Amer >60 >60 mL/min    Comment: (NOTE) The eGFR has been calculated using the CKD EPI equation. This calculation has not been validated in all clinical situations. eGFR's persistently <60 mL/min signify possible Chronic Kidney Disease.    Anion gap 9 5 - 15  CBC with Differential/Platelet     Status: Abnormal   Collection Time: 01/31/17  5:33 AM  Result Value Ref Range   WBC 8.2 4.0 - 10.5 K/uL   RBC 2.43 (L) 3.87 - 5.11 MIL/uL   Hemoglobin 8.7 (L) 12.0 - 15.0 g/dL   HCT 26.4 (L) 36.0 - 46.0 %   MCV 108.6 (H) 78.0 - 100.0 fL   MCH 35.8 (H) 26.0 - 34.0 pg   MCHC 33.0 30.0 - 36.0 g/dL   RDW 18.6 (H) 11.5 - 15.5 %   Platelets 144 (L) 150 - 400 K/uL   Neutrophils Relative % 77 %   Neutro Abs 6.3 1.7 - 7.7 K/uL   Lymphocytes Relative 12 %   Lymphs Abs 1.0 0.7 - 4.0 K/uL   Monocytes Relative 10 %   Monocytes Absolute 0.8 0.1 - 1.0 K/uL   Eosinophils Relative 1 %   Eosinophils Absolute 0.1 0.0 - 0.7 K/uL   Basophils Relative 0 %   Basophils Absolute 0.0 0.0 - 0.1 K/uL  Basic metabolic panel     Status: Abnormal   Collection Time: 01/31/17  5:33 AM  Result Value Ref Range   Sodium 137 135 - 145 mmol/L   Potassium 2.7 (LL) 3.5 - 5.1 mmol/L    Comment: CRITICAL RESULT CALLED TO, READ BACK BY AND VERIFIED WITH: [facility] AT 6045 ON 02.18.2018 BY NBROOKS    Chloride 105 101 - 111 mmol/L   CO2 24 22 - 32 mmol/L   Glucose, Bld 125 (H) 65 - 99 mg/dL   BUN 7 6 - 20 mg/dL   Creatinine, Ser 0.47 0.44 - 1.00 mg/dL   Calcium 8.4 (L) 8.9 - 10.3 mg/dL   GFR calc non Af Amer >60 >60 mL/min   GFR calc Af Amer >60 >60 mL/min    Comment: (NOTE) The eGFR has been calculated  using the CKD EPI equation. This calculation has not been validated in all clinical situations. eGFR's persistently <60 mL/min signify possible Chronic Kidney Disease.    Anion gap 8 5 -  15  Lipase, blood     Status: Abnormal   Collection Time: 01/31/17  5:33 AM  Result Value Ref Range   Lipase 164 (H) 11 - 51 U/L  Basic metabolic panel     Status: Abnormal   Collection Time: 01/31/17  7:40 PM  Result Value Ref Range   Sodium 137 135 - 145 mmol/L   Potassium 3.1 (L) 3.5 - 5.1 mmol/L   Chloride 104 101 - 111 mmol/L   CO2 25 22 - 32 mmol/L   Glucose, Bld 128 (H) 65 - 99 mg/dL   BUN 5 (L) 6 - 20 mg/dL   Creatinine, Ser 0.45 0.44 - 1.00 mg/dL   Calcium 8.1 (L) 8.9 - 10.3 mg/dL   GFR calc non Af Amer >60 >60 mL/min   GFR calc Af Amer >60 >60 mL/min    Comment: (NOTE) The eGFR has been calculated using the CKD EPI equation. This calculation has not been validated in all clinical situations. eGFR's persistently <60 mL/min signify possible Chronic Kidney Disease.    Anion gap 8 5 - 15  Comprehensive metabolic panel     Status: Abnormal   Collection Time: 02/01/17  5:52 AM  Result Value Ref Range   Sodium 134 (L) 135 - 145 mmol/L   Potassium 2.3 (LL) 3.5 - 5.1 mmol/L    Comment: RESULT REPEATED AND VERIFIED DELTA CHECK NOTED CRITICAL RESULT CALLED TO, READ BACK BY AND VERIFIED WITH: Nelta Numbers RN @ [phone] ON 02/01/17 BY C DAVIS    Chloride 100 (L) 101 - 111 mmol/L   CO2 26 22 - 32 mmol/L   Glucose, Bld 155 (H) 65 - 99 mg/dL   BUN <5 (L) 6 - 20 mg/dL   Creatinine, Ser 0.34 (L) 0.44 - 1.00 mg/dL   Calcium 8.0 (L) 8.9 - 10.3 mg/dL   Total Protein 5.1 (L) 6.5 - 8.1 g/dL   Albumin 2.8 (L) 3.5 - 5.0 g/dL   AST 46 (H) 15 - 41 U/L   ALT 18 14 - 54 U/L   Alkaline Phosphatase 77 38 - 126 U/L   Total Bilirubin 0.4 0.3 - 1.2 mg/dL   GFR calc non Af Amer >60 >60 mL/min   GFR calc Af Amer >60 >60 mL/min    Comment: (NOTE) The eGFR has been calculated using the CKD EPI equation. This  calculation has not been validated in all clinical situations. eGFR's persistently <60 mL/min signify possible Chronic Kidney Disease.    Anion gap 8 5 - 15  CBC with Differential/Platelet     Status: Abnormal   Collection Time: 02/01/17  5:52 AM  Result Value Ref Range   WBC 5.8 4.0 - 10.5 K/uL   RBC 2.23 (L) 3.87 - 5.11 MIL/uL   Hemoglobin 8.0 (L) 12.0 - 15.0 g/dL   HCT 23.8 (L) 36.0 - 46.0 %   MCV 106.7 (H) 78.0 - 100.0 fL   MCH 35.9 (H) 26.0 - 34.0 pg   MCHC 33.6 30.0 - 36.0 g/dL   RDW 18.6 (H) 11.5 - 15.5 %   Platelets 138 (L) 150 - 400 K/uL   Neutrophils Relative % 72 %   Neutro Abs 4.1 1.7 - 7.7 K/uL   Lymphocytes Relative 15 %   Lymphs Abs 0.9 0.7 - 4.0 K/uL   Monocytes Relative 12 %   Monocytes Absolute 0.7 0.1 - 1.0 K/uL   Eosinophils Relative 1 %   Eosinophils Absolute 0.1 0.0 - 0.7 K/uL   Basophils Relative 0 %  Basophils Absolute 0.0 0.0 - 0.1 K/uL  Magnesium     Status: Abnormal   Collection Time: 02/01/17  5:52 AM  Result Value Ref Range   Magnesium 1.4 (L) 1.7 - 2.4 mg/dL    Mr 3d Recon At Scanner  Result Date: 01/31/2017 CLINICAL DATA:  71 year old female with past medical history of alcohol abuse presenting with upper abdominal pain for the past 5 days. Possible choledocholithiasis noted on recent CT the abdomen and pelvis which also demonstrated probable pancreatitis. Followup evaluation. EXAM: MRI ABDOMEN WITHOUT AND WITH CONTRAST (INCLUDING MRCP) TECHNIQUE: Multiplanar multisequence MR imaging of the abdomen was performed both before and after the administration of intravenous contrast. Heavily T2-weighted images of the biliary and pancreatic ducts were obtained, and three-dimensional MRCP images were rendered by post processing. CONTRAST:  57m MULTIHANCE GADOBENATE DIMEGLUMINE 529 MG/ML IV SOLN COMPARISON:  No prior abdominal MRI. CT the abdomen and pelvis 01/30/2017. FINDINGS: Comment: Study is significantly limited by a large amount of patient respiratory  motion. Lower chest: Small bilateral pleural effusions lying dependently. Hepatobiliary: Diffuse loss of signal intensity throughout the hepatic parenchyma on out of phase dual echo images, compatible with hepatic steatosis. No discrete cystic or solid hepatic lesions are identified. MRCP images demonstrate mild intra and extrahepatic biliary ductal dilatation. There are several small filling defects within the gallbladder, compatible with gallstones. No findings to suggest acute cholecystitis at this time. Common bile duct measures up to 12 mm in the porta hepatis, and there are several small filling defects within the distal aspect of the common bile duct, compatible with choledocholithiasis. The largest of these measure up to 6 mm in diameter (image 15 of series 5). Pancreas: No pancreatic mass. No pancreatic ductal dilatation on MRCP images. Small amount of peripancreatic fluid most evident adjacent to the distal body and tail of the pancreas. Spleen:  Unremarkable. Adrenals/Urinary Tract: 1 cm nodule in the medial limb of the left adrenal gland demonstrates loss of signal intensity on out of phase dual echo images, compatible with an adenoma. Right adrenal gland and bilateral kidneys are normal in appearance. No hydroureteronephrosis in the visualized portions of the abdomen. Stomach/Bowel: Visualized portions are unremarkable. Vascular/Lymphatic: No aneurysm identified in the abdominal vasculature. No lymphadenopathy noted in the abdomen. Other: Trace amount of fluid adjacent to the distal body and tail of the pancreas. Trace amount of perihepatic ascites. Musculoskeletal: No aggressive osseous lesions are identified in the visualized portions of the skeleton. IMPRESSION: 1. Study is positive for choledocholithiasis with mild intra and extrahepatic biliary ductal dilatation, indicative of obstruction. 2. Cholelithiasis without evidence of acute cholecystitis at this time. 3. Inflammatory changes adjacent to  the distal body and tail of the pancreas, suggestive of an acute pancreatitis. 4. Small 1 cm left adrenal adenoma incidentally noted. 5. Trace volume of ascites. Electronically Signed   By: DVinnie LangtonM.D.   On: 01/31/2017 16:58   Mr Abdomen Mrcp WMoise BoringContast  Result Date: 01/31/2017 CLINICAL DATA:  71year old female with past medical history of alcohol abuse presenting with upper abdominal pain for the past 5 days. Possible choledocholithiasis noted on recent CT the abdomen and pelvis which also demonstrated probable pancreatitis. Followup evaluation. EXAM: MRI ABDOMEN WITHOUT AND WITH CONTRAST (INCLUDING MRCP) TECHNIQUE: Multiplanar multisequence MR imaging of the abdomen was performed both before and after the administration of intravenous contrast. Heavily T2-weighted images of the biliary and pancreatic ducts were obtained, and three-dimensional MRCP images were rendered by post processing. CONTRAST:  166mMULTIHANCE  GADOBENATE DIMEGLUMINE 529 MG/ML IV SOLN COMPARISON:  No prior abdominal MRI. CT the abdomen and pelvis 01/30/2017. FINDINGS: Comment: Study is significantly limited by a large amount of patient respiratory motion. Lower chest: Small bilateral pleural effusions lying dependently. Hepatobiliary: Diffuse loss of signal intensity throughout the hepatic parenchyma on out of phase dual echo images, compatible with hepatic steatosis. No discrete cystic or solid hepatic lesions are identified. MRCP images demonstrate mild intra and extrahepatic biliary ductal dilatation. There are several small filling defects within the gallbladder, compatible with gallstones. No findings to suggest acute cholecystitis at this time. Common bile duct measures up to 12 mm in the porta hepatis, and there are several small filling defects within the distal aspect of the common bile duct, compatible with choledocholithiasis. The largest of these measure up to 6 mm in diameter (image 15 of series 5). Pancreas: No  pancreatic mass. No pancreatic ductal dilatation on MRCP images. Small amount of peripancreatic fluid most evident adjacent to the distal body and tail of the pancreas. Spleen:  Unremarkable. Adrenals/Urinary Tract: 1 cm nodule in the medial limb of the left adrenal gland demonstrates loss of signal intensity on out of phase dual echo images, compatible with an adenoma. Right adrenal gland and bilateral kidneys are normal in appearance. No hydroureteronephrosis in the visualized portions of the abdomen. Stomach/Bowel: Visualized portions are unremarkable. Vascular/Lymphatic: No aneurysm identified in the abdominal vasculature. No lymphadenopathy noted in the abdomen. Other: Trace amount of fluid adjacent to the distal body and tail of the pancreas. Trace amount of perihepatic ascites. Musculoskeletal: No aggressive osseous lesions are identified in the visualized portions of the skeleton. IMPRESSION: 1. Study is positive for choledocholithiasis with mild intra and extrahepatic biliary ductal dilatation, indicative of obstruction. 2. Cholelithiasis without evidence of acute cholecystitis at this time. 3. Inflammatory changes adjacent to the distal body and tail of the pancreas, suggestive of an acute pancreatitis. 4. Small 1 cm left adrenal adenoma incidentally noted. 5. Trace volume of ascites. Electronically Signed   By: Vinnie Langton M.D.   On: 01/31/2017 16:58   US Abdomen Limited Ruq  Result Date: 01/30/2017 CLINICAL DATA:  Cholelithiasis, abnormal CT, pain, vomiting EXAM: US ABDOMEN LIMITED - RIGHT UPPER QUADRANT COMPARISON:  CT abdomen/pelvis 01/30/2017 FINDINGS: Gallbladder: Shadowing gallstones in gallbladder up to 10 mm diameter. Gallbladder contracted. Wall suboptimally imaged. Mild tenderness in the region of the gallbladder with imaging. No pericholecystic fluid. Common bile duct: Diameter: Dilated 8 mm diameter at the porta hepatis, dilating to 14 mm more distally. Distal CBD is not imaged ; the  questioned distal common duct stones identified on CT are unable to be assessed sonographically due to bowel gas. Liver: Echogenic, secondary to fatty infiltration as seen on prior CT. Hepatopetal portal venous flow. No definite hepatic mass. No RIGHT upper quadrant free fluid. IMPRESSION: Contracted gallbladder with multiple gallstones. Fatty infiltration of liver. Extrahepatic biliary dilatation, though the distal CBD and potential choledocholithiasis identified on CT are obscured by bowel gas ; if clinically indicated these could be better assessed by MRCP imaging. Electronically Signed   By: Lavonia Dana M.D.   On: 01/30/2017 13:55               Blood pressure (!) 101/59, pulse 81, temperature 97.8 F (36.6 C), temperature source Oral, resp. rate 18, height '5\' 6"'  (1.676 m), weight 55.7 kg (122 lb 12.7 oz), SpO2 92 %.  Physical exam:   General-- thin and frail appearing white female ENT-- nonicteric Neck--  supple with no lymphadenopathy Heart-- regular rate and rhythm without murmurs gallops Lungs-- clear Abdomen-- nondistended minimally tender with good bowel sounds   Assessment: 1. Gallstone pancreatitis. The patient's clinical pictures much more consistent with passage of gallstone although alcohol may be contributing. She clinically is asymptomatic area unfortunately there are stones documented in her CBD. 2. Alcohol abuse. This is probably contributing somewhat.  Plan: have discussed with patient and daughter will try to get her on the schedule for ERCP in stone extraction later today. Have discussed this in detail with the patient and her daughter and have discussed with the patient and daughter including the potential risk and benefits.   Adya Wirz JR,Madolyn Ackroyd L 02/01/2017, 8:10 AM   This note was created using voice recognition software and minor errors may Have occurred unintentionally. Pager: [phone] If no answer or after hours call [phone]

## 2017-02-01 NOTE — Anesthesia Preprocedure Evaluation (Addendum)
Anesthesia Evaluation  Patient identified by MRN, date of birth, ID band Patient awake    Reviewed: Allergy & Precautions, NPO status , Patient's Chart, lab work & pertinent test results  Airway Mallampati: II       Dental  (+) Teeth Intact, Dental Advisory Given   Pulmonary Current Smoker,    breath sounds clear to auscultation       Cardiovascular hypertension, + Peripheral Vascular Disease   Rhythm:Regular Rate:Normal     Neuro/Psych PSYCHIATRIC DISORDERS Anxiety Depression  Neuromuscular disease    GI/Hepatic negative GI ROS, Neg liver ROS,   Endo/Other  negative endocrine ROS  Renal/GU negative Renal ROS  negative genitourinary   Musculoskeletal negative musculoskeletal ROS (+)   Abdominal   Peds negative pediatric ROS (+)  Hematology negative hematology ROS (+)   Anesthesia Other Findings - HLD  Reproductive/Obstetrics negative OB ROS                            Lab Results  Component Value Date   WBC 5.8 02/01/2017   HGB 8.0 (L) 02/01/2017   HCT 23.8 (L) 02/01/2017   MCV 106.7 (H) 02/01/2017   PLT 138 (L) 02/01/2017   Lab Results  Component Value Date   CREATININE 0.34 (L) 02/01/2017   BUN <5 (L) 02/01/2017   NA 134 (L) 02/01/2017   K 2.3 (LL) 02/01/2017   CL 100 (L) 02/01/2017   CO2 26 02/01/2017   Lab Results  Component Value Date   INR 0.95 01/29/2017   05/2016 EKG: normal sinus rhythm.  Anesthesia Physical Anesthesia Plan  ASA: II  Anesthesia Plan: General   Post-op Pain Management:    Induction: Intravenous, Rapid sequence and Cricoid pressure planned  Airway Management Planned: Oral ETT  Additional Equipment:   Intra-op Plan:   Post-operative Plan: Extubation in OR  Informed Consent: I have reviewed the patients History and Physical, chart, labs and discussed the procedure including the risks, benefits and alternatives for the proposed anesthesia  with the patient or authorized representative who has indicated his/her understanding and acceptance.   Dental advisory given  Plan Discussed with: CRNA  Anesthesia Plan Comments:         Anesthesia Quick Evaluation

## 2017-02-01 NOTE — Progress Notes (Signed)
PROGRESS NOTE    April PoissonDeborah S [name]  WUJ:[id number] DOB: 06/08/1946 DOA: 01/29/2017 PCP: April [name]    Brief Narrative:  71 yo female with past medical history of etho abuse, presents with abdominal pain for the last 5 days, epigastric and constant, associated with vomiting. On the initial physical examination, hemodynamically stable, with positive abdominal pain. Lipase 1,692. CT abdomen with pancreatitis with common bile duct dilatation with possible choledocholithiasis. Admitted for further treatment.  MRCP positive for choledocolithiasis follow with ERCP.   Assessment & Plan:   Principal Problem:   Gallstone pancreatitis Active Problems:   Alcohol abuse   Hypokalemia   Macrocytic anemia   Metabolic acidosis  1. Acute gallstone pancreatitis. Pain controlled with as needed morphine, continue IV fluids and antiemetics. Gastroenterology consulted and for ERCP today. Continue pain control and supportive IV fluids.   2. Common bile duct dilatation. Patient for ERCP today. Follow on GI recommendations.   3. Hypokalemia and hypomagnesemia. Aggressive K and Mg repletion, IV 30 meq plus 2 doses of 40 meq, follow renal panel in am.   4. Etho dependence. Continue as needed lorazepam, mild withdrawal symptoms, controlled.   5. Tobacco abuse. Smoking cessation, on 21 mg nicotine patch.   6. Suspected new urine infection (likely present on admission). Continue ciprofloxacin IV, follow on urine culture.   DVT prophylaxis:enoxaparin  Code Status:DNR  Family Communication:No family at the bedside Disposition Plan:Home  Consultants:    Procedures:  Antimicrobials:     Subjective:   Objective: Vitals:   02/01/17 0022 02/01/17 0053 02/01/17 0557 02/01/17 1144  BP: 113/62  (!) 101/59 127/66  Pulse: 91  81 88  Resp: 18   (!) 26  Temp: 98.8 F (37.1 C)  97.8 F (36.6 C) 97.9 F (36.6 C)  TempSrc: Oral  Oral Oral  SpO2: (!) 88% 90% 92% 93%  Weight:     55.3 kg (122 lb)  Height:    5\' 6"  (1.676 m)    Intake/Output Summary (Last 24 hours) at 02/01/17 1418 Last data filed at 02/01/17 0547  Gross per 24 hour  Intake          2133.75 ml  Output                0 ml  Net          2133.75 ml   Filed Weights   01/29/17 2053 01/30/17 0311 02/01/17 1144  Weight: 56.7 kg (125 lb) 55.7 kg (122 lb 12.7 oz) 55.3 kg (122 lb)    Examination:  General exam: deconditioned E ENT: mild pallor, oral mucosa dry. No icterus.   Respiratory system: Mild decrease breath sounds at bases.  Respiratory effort normal. Cardiovascular system: S1 & S2 heard, RRR. No JVD, murmurs, rubs, gallops or clicks. No pedal edema. Gastrointestinal system: Abdomen  Mild distended, soft and nontender. No organomegaly or masses felt. Normal bowel sounds heard. Central nervous system: Alert and oriented. No focal neurological deficits. Extremities: Symmetric 5 x 5 power. Skin: No rashes, lesions or ulcers.     Data Reviewed: I have personally reviewed following labs and imaging studies  CBC:  Recent Labs Lab 01/29/17 2101 01/30/17 0550 01/31/17 0533 02/01/17 0552  WBC 9.0 7.8 8.2 5.8  NEUTROABS  --   --  6.3 4.1  HGB 10.6* 8.8* 8.7* 8.0*  HCT 32.1* 25.8* 26.4* 23.8*  MCV 108.8* 108.4* 108.6* 106.7*  PLT 206 162 144* 138*   Basic Metabolic Panel:  Recent Labs Lab 01/30/17 0550  01/30/17 1502 01/31/17 0533 01/31/17 1940 02/01/17 0552 02/01/17 1113  NA 143 139 137 137 134* 136  K 2.6* 2.5* 2.7* 3.1* 2.3* 2.6*  CL 113* 107 105 104 100* 99*  CO2 22 23 24 25 26 28   GLUCOSE 94 97 125* 128* 155* 143*  BUN 13 9 7  5* <5* <5*  CREATININE 0.54 0.45 0.47 0.45 0.34* <0.30*  CALCIUM 8.6* 8.8* 8.4* 8.1* 8.0* 8.2*  MG 1.5*  --   --   --  1.4*  --    GFR: CrCl cannot be calculated (This lab value cannot be used to calculate CrCl because it is not a number: <0.30). Liver Function Tests:  Recent Labs Lab 01/29/17 2101 01/30/17 0550 02/01/17 0552  AST 85* 50*  46*  ALT 30 21 18   ALKPHOS 103 81 77  BILITOT 3.1* 1.4* 0.4  PROT 7.2 5.5* 5.1*  ALBUMIN 4.4 3.2* 2.8*    Recent Labs Lab 01/29/17 2101 01/31/17 0533  LIPASE 1,692* 164*   No results for input(s): AMMONIA in the last 168 hours. Coagulation Profile:  Recent Labs Lab 01/29/17 2358  INR 0.95   Cardiac Enzymes: No results for input(s): CKTOTAL, CKMB, CKMBINDEX, TROPONINI in the last 168 hours. BNP (last 3 results) No results for input(s): PROBNP in the last 8760 hours. HbA1C: No results for input(s): HGBA1C in the last 72 hours. CBG: No results for input(s): GLUCAP in the last 168 hours. Lipid Profile: No results for input(s): CHOL, HDL, LDLCALC, TRIG, CHOLHDL, LDLDIRECT in the last 72 hours. Thyroid Function Tests: No results for input(s): TSH, T4TOTAL, FREET4, T3FREE, THYROIDAB in the last 72 hours. Anemia Panel:  Recent Labs  01/30/17 0550  VITAMINB12 294   Sepsis Labs: No results for input(s): PROCALCITON, LATICACIDVEN in the last 168 hours.  No results found for this or any previous visit (from the past 240 hour(s)).       Radiology Studies: Mr 3d Recon At Scanner  Result Date: 01/31/2017 CLINICAL DATA:  71 year old female with past medical history of alcohol abuse presenting with upper abdominal pain for the past 5 days. Possible choledocholithiasis noted on recent CT the abdomen and pelvis which also demonstrated probable pancreatitis. Followup evaluation. EXAM: MRI ABDOMEN WITHOUT AND WITH CONTRAST (INCLUDING MRCP) TECHNIQUE: Multiplanar multisequence MR imaging of the abdomen was performed both before and after the administration of intravenous contrast. Heavily T2-weighted images of the biliary and pancreatic ducts were obtained, and three-dimensional MRCP images were rendered by post processing. CONTRAST:  11mL MULTIHANCE GADOBENATE DIMEGLUMINE 529 MG/ML IV SOLN COMPARISON:  No prior abdominal MRI. CT the abdomen and pelvis 01/30/2017. FINDINGS: Comment:  Study is significantly limited by a large amount of patient respiratory motion. Lower chest: Small bilateral pleural effusions lying dependently. Hepatobiliary: Diffuse loss of signal intensity throughout the hepatic parenchyma on out of phase dual echo images, compatible with hepatic steatosis. No discrete cystic or solid hepatic lesions are identified. MRCP images demonstrate mild intra and extrahepatic biliary ductal dilatation. There are several small filling defects within the gallbladder, compatible with gallstones. No findings to suggest acute cholecystitis at this time. Common bile duct measures up to 12 mm in the porta hepatis, and there are several small filling defects within the distal aspect of the common bile duct, compatible with choledocholithiasis. The largest of these measure up to 6 mm in diameter (image 15 of series 5). Pancreas: No pancreatic mass. No pancreatic ductal dilatation on MRCP images. Small amount of peripancreatic fluid most evident adjacent to the  distal body and tail of the pancreas. Spleen:  Unremarkable. Adrenals/Urinary Tract: 1 cm nodule in the medial limb of the left adrenal gland demonstrates loss of signal intensity on out of phase dual echo images, compatible with an adenoma. Right adrenal gland and bilateral kidneys are normal in appearance. No hydroureteronephrosis in the visualized portions of the abdomen. Stomach/Bowel: Visualized portions are unremarkable. Vascular/Lymphatic: No aneurysm identified in the abdominal vasculature. No lymphadenopathy noted in the abdomen. Other: Trace amount of fluid adjacent to the distal body and tail of the pancreas. Trace amount of perihepatic ascites. Musculoskeletal: No aggressive osseous lesions are identified in the visualized portions of the skeleton. IMPRESSION: 1. Study is positive for choledocholithiasis with mild intra and extrahepatic biliary ductal dilatation, indicative of obstruction. 2. Cholelithiasis without evidence of  acute cholecystitis at this time. 3. Inflammatory changes adjacent to the distal body and tail of the pancreas, suggestive of an acute pancreatitis. 4. Small 1 cm left adrenal adenoma incidentally noted. 5. Trace volume of ascites. Electronically Signed   By: Trudie Reed M.D.   On: 01/31/2017 16:58   Mr Abdomen Mrcp Vivien Rossetti Contast  Result Date: 01/31/2017 CLINICAL DATA:  71 year old female with past medical history of alcohol abuse presenting with upper abdominal pain for the past 5 days. Possible choledocholithiasis noted on recent CT the abdomen and pelvis which also demonstrated probable pancreatitis. Followup evaluation. EXAM: MRI ABDOMEN WITHOUT AND WITH CONTRAST (INCLUDING MRCP) TECHNIQUE: Multiplanar multisequence MR imaging of the abdomen was performed both before and after the administration of intravenous contrast. Heavily T2-weighted images of the biliary and pancreatic ducts were obtained, and three-dimensional MRCP images were rendered by post processing. CONTRAST:  11mL MULTIHANCE GADOBENATE DIMEGLUMINE 529 MG/ML IV SOLN COMPARISON:  No prior abdominal MRI. CT the abdomen and pelvis 01/30/2017. FINDINGS: Comment: Study is significantly limited by a large amount of patient respiratory motion. Lower chest: Small bilateral pleural effusions lying dependently. Hepatobiliary: Diffuse loss of signal intensity throughout the hepatic parenchyma on out of phase dual echo images, compatible with hepatic steatosis. No discrete cystic or solid hepatic lesions are identified. MRCP images demonstrate mild intra and extrahepatic biliary ductal dilatation. There are several small filling defects within the gallbladder, compatible with gallstones. No findings to suggest acute cholecystitis at this time. Common bile duct measures up to 12 mm in the porta hepatis, and there are several small filling defects within the distal aspect of the common bile duct, compatible with choledocholithiasis. The largest of these  measure up to 6 mm in diameter (image 15 of series 5). Pancreas: No pancreatic mass. No pancreatic ductal dilatation on MRCP images. Small amount of peripancreatic fluid most evident adjacent to the distal body and tail of the pancreas. Spleen:  Unremarkable. Adrenals/Urinary Tract: 1 cm nodule in the medial limb of the left adrenal gland demonstrates loss of signal intensity on out of phase dual echo images, compatible with an adenoma. Right adrenal gland and bilateral kidneys are normal in appearance. No hydroureteronephrosis in the visualized portions of the abdomen. Stomach/Bowel: Visualized portions are unremarkable. Vascular/Lymphatic: No aneurysm identified in the abdominal vasculature. No lymphadenopathy noted in the abdomen. Other: Trace amount of fluid adjacent to the distal body and tail of the pancreas. Trace amount of perihepatic ascites. Musculoskeletal: No aggressive osseous lesions are identified in the visualized portions of the skeleton. IMPRESSION: 1. Study is positive for choledocholithiasis with mild intra and extrahepatic biliary ductal dilatation, indicative of obstruction. 2. Cholelithiasis without evidence of acute cholecystitis at this time.  3. Inflammatory changes adjacent to the distal body and tail of the pancreas, suggestive of an acute pancreatitis. 4. Small 1 cm left adrenal adenoma incidentally noted. 5. Trace volume of ascites. Electronically Signed   By: Trudie Reed M.D.   On: 01/31/2017 16:58        Scheduled Meds: . [MAR Hold] ciprofloxacin  400 mg Intravenous Q12H  . [MAR Hold] enoxaparin (LOVENOX) injection  40 mg Subcutaneous Q24H  . [MAR Hold] folic acid  1 mg Oral Daily  . [MAR Hold] gabapentin  600 mg Oral TID  . [MAR Hold] multivitamin with minerals  1 tablet Oral Daily  . [MAR Hold] nicotine  21 mg Transdermal Daily  . [MAR Hold] potassium chloride  40 mEq Oral BID  . [MAR Hold] thiamine  100 mg Oral Daily   Or  . [MAR Hold] thiamine  100 mg  Intravenous Daily   Continuous Infusions: . sodium chloride    . dextrose 5 % and 0.45% NaCl 75 mL/hr at 02/01/17 0547  . lactated ringers       LOS: 2 days       Coralie Keens, MD [facility] Pager [phone]  If 7PM-7AM, please contact night-coverage [email or web address] Password [email or web address] 02/01/2017, 2:18 PM

## 2017-02-01 NOTE — Progress Notes (Signed)
PHARMACY NOTE:  ANTIMICROBIAL RENAL DOSAGE ADJUSTMENT  Current antimicrobial regimen includes a mismatch between antimicrobial dosage and estimated renal function.  As per policy approved by the Pharmacy & Therapeutics and Medical Executive Committees, the antimicrobial dosage will be adjusted accordingly.  Current antimicrobial dosage:  Cipro 200mg  q12  Indication: possible UTI  Renal Function:  Estimated Creatinine Clearance: 58.4 mL/min (by C-G formula based on SCr of 0.34 mg/dL (L)). []      On intermittent HD, scheduled: []      On CRRT    Antimicrobial dosage has been changed to:  400mg  q12  Additional comments:   Thank you for allowing pharmacy to be a part of this patient's care.  Earl ManyLegge, Keonda Dow [place], [facility] Behavioral HealthRPH 02/01/2017 9:08 AM

## 2017-02-01 NOTE — Transfer of Care (Signed)
Immediate Anesthesia Transfer of Care Note  Patient: April PoissonDeborah S Peerson  Procedure(s) Performed: Procedure(s): ENDOSCOPIC RETROGRADE CHOLANGIOPANCREATOGRAPHY (ERCP) (N/A)  Patient Location: PACU  Anesthesia Type:General  Level of Consciousness: sedated, patient cooperative and responds to stimulation  Airway & Oxygen Therapy: Patient Spontanous Breathing and Patient connected to face mask oxygen  Post-op Assessment: Report given to RN and Post -op Vital signs reviewed and stable  Post vital signs: Reviewed and stable  Last Vitals:  Vitals:   02/01/17 0557 02/01/17 1144  BP: (!) 101/59 127/66  Pulse: 81 88  Resp:  (!) 26  Temp: 36.6 C 36.6 C    Last Pain:  Vitals:   02/01/17 1144  TempSrc: Oral  PainSc: 8       Patients Stated Pain Goal: 1 (01/31/17 1119)  Complications: No apparent anesthesia complications

## 2017-02-01 NOTE — Progress Notes (Signed)
Callback from Dr. Selena BattenKim, states orders will be put in for pt. Will continue to monitor.

## 2017-02-01 NOTE — Op Note (Signed)
[facility] Patient Name: April [name] Procedure Date: 02/01/2017 MRN: [id number] Attending MD: Barrie Folk , MD Date of Birth: October 14, 1946 CSN: [id number] Age: 71 Admit Type: Inpatient Procedure:                ERCP Indications:              Suspected bile duct stone(s) Providers:                Everardo All. Madilyn Fireman, MD, Anthony Sar, RN, Tillie Fantasia, RN, Kandice Robinsons, Technician Referring MD:              Medicines:                General Anesthesia Complications:            No immediate complications. Estimated Blood Loss:     Estimated blood loss: none. Procedure:                Pre-Anesthesia Assessment:                           - Prior to the procedure, a History and Physical                            was performed, and patient medications and                            allergies were reviewed. The patient's tolerance of                            previous anesthesia was also reviewed. The risks                            and benefits of the procedure and the sedation                            options and risks were discussed with the patient.                            All questions were answered, and informed consent                            was obtained. Prior Anticoagulants: The patient has                            taken no previous anticoagulant or antiplatelet                            agents. ASA Grade Assessment: II - A patient with                            mild systemic disease. After reviewing the risks  and benefits, the patient was deemed in                            satisfactory condition to undergo the procedure.                           After obtaining informed consent, the scope was                            passed under direct vision. Throughout the                            procedure, the patient's blood pressure, pulse, and                            oxygen saturations were  monitored continuously. The                            [id number] ([id number]) scope was introduced through                            the mouth, and used to inject contrast into and                            used to inject contrast into the ventral pancreatic                            duct. The ERCP was technically difficult and                            complex due to a large stone. The patient tolerated                            the procedure well. Findings:      The major papilla was normal. The minor papilla was not found. A wire       was passed into the biliary tree. The bile duct was then deeply       cannulated over the guidewire. Contrast was injected. I personally       interpreted the bile duct images. Ductal flow of contrast was adequate.       Image quality was adequate. Contrast extended to the entire biliary       tree. Opacification of the main bile duct was successful after precut.       The maximum diameter of the ducts was 12 mm. The lower third of the main       bile duct was partially obstructed by a narrowing that did not appear to       be a stone or a mass. The scope was passed under direct vision through       the upper GI tract. A moderate post-ulcer deformity was found in the       first portion of the duodenum. Impression:               - The major papilla appeared normal.                           -  A biliary tract obstruction was found in the                            lower third of the main duct.                           - A bile leak was found. Moderate Sedation:      no moderate sedation Recommendation:           - Observe patient's clinical course.                           - Check liver enzymes (AST, ALT, alkaline                            phosphatase, bilirubin) tomorrow.                           - Refer to a surgeon at appointment to be scheduled.                           - consider repeat ERCP attempt versus proceed to                             surgery. Procedure Code(s):        --- Professional ---                           [id number], Endoscopic retrograde                            cholangiopancreatography (ERCP); diagnostic,                            including collection of specimen(s) by brushing or                            washing, when performed (separate procedure) Diagnosis Code(s):        --- Professional ---                           K83.1, Obstruction of bile duct                           K83.9, Disease of biliary tract, unspecified CPT copyright 2016 American Medical Association. All rights reserved. The codes documented in this report are preliminary and upon coder review may  be revised to meet current compliance requirements. Barrie Folk, MD 02/01/2017 3:19:38 PM This report has been signed electronically. Number of Addenda: 0

## 2017-02-01 NOTE — Anesthesia Postprocedure Evaluation (Addendum)
Anesthesia Post Note  Patient: April PoissonDeborah S Pitner  Procedure(s) Performed: Procedure(s) (LRB): ENDOSCOPIC RETROGRADE CHOLANGIOPANCREATOGRAPHY (ERCP) (N/A)  Patient location during evaluation: PACU Anesthesia Type: General Level of consciousness: awake and alert Pain management: pain level controlled Vital Signs Assessment: post-procedure vital signs reviewed and stable Respiratory status: spontaneous breathing, nonlabored ventilation, respiratory function stable and patient connected to nasal cannula oxygen Cardiovascular status: blood pressure returned to baseline and stable Postop Assessment: no signs of nausea or vomiting Anesthetic complications: no       Last Vitals:  Vitals:   02/01/17 1610 02/01/17 1636  BP: (!) 92/58 (!) 103/59  Pulse: 93 88  Resp: 16 18  Temp:  36.6 C    Last Pain:  Vitals:   02/01/17 1636  TempSrc: Oral  PainSc:                  Shelton SilvasKevin D Nakia Remmers

## 2017-02-01 NOTE — Anesthesia Procedure Notes (Signed)
Procedure Name: Intubation Performed by: Gean Maidens Pre-anesthesia Checklist: Emergency Drugs available, Patient identified, Suction available, Patient being monitored and Timeout performed Patient Re-evaluated:Patient Re-evaluated prior to inductionOxygen Delivery Method: Circle system utilized Preoxygenation: Pre-oxygenation with 100% oxygen Intubation Type: IV induction Ventilation: Mask ventilation without difficulty Laryngoscope Size: Mac and 3 Grade View: Grade III Tube type: Oral Tube size: 7.0 mm Number of attempts: 1 Airway Equipment and Method: Stylet Placement Confirmation: positive ETCO2,  CO2 detector and breath sounds checked- equal and bilateral Secured at: 21 cm Tube secured with: Tape Dental Injury: Teeth and Oropharynx as per pre-operative assessment  Difficulty Due To: Difficulty was unanticipated and Difficult Airway- due to limited oral opening Future Recommendations: Recommend- induction with short-acting agent, and alternative techniques readily available

## 2017-02-01 NOTE — Progress Notes (Signed)
CRITICAL VALUE ALERT  Critical value received: Potassium 2.3  Date of notification:  02/01/17  Time of notification:  0657am  Critical value read back:yes  Nurse who received alert:  Clydie BraunLeslie Terrilee Dudzik, RN  MD notified (1st page):  Dr. Selena BattenKim   Time of first page:  0659am  MD notified (2nd page):  Time of second page:  Responding MD:    Time MD responded:

## 2017-02-02 ENCOUNTER — Encounter (HOSPITAL_COMMUNITY): Payer: Self-pay | Admitting: Gastroenterology

## 2017-02-02 LAB — CBC WITH DIFFERENTIAL/PLATELET
BASOS PCT: 0 %
Basophils Absolute: 0 10*3/uL (ref 0.0–0.1)
Eosinophils Absolute: 0.1 10*3/uL (ref 0.0–0.7)
Eosinophils Relative: 2 %
HEMATOCRIT: 23.4 % — AB (ref 36.0–46.0)
Hemoglobin: 7.7 g/dL — ABNORMAL LOW (ref 12.0–15.0)
LYMPHS ABS: 0.5 10*3/uL — AB (ref 0.7–4.0)
Lymphocytes Relative: 9 %
MCH: 35.5 pg — AB (ref 26.0–34.0)
MCHC: 32.9 g/dL (ref 30.0–36.0)
MCV: 107.8 fL — AB (ref 78.0–100.0)
MONO ABS: 1 10*3/uL (ref 0.1–1.0)
MONOS PCT: 18 %
NEUTROS ABS: 4 10*3/uL (ref 1.7–7.7)
Neutrophils Relative %: 71 %
Platelets: 172 10*3/uL (ref 150–400)
RBC: 2.17 MIL/uL — ABNORMAL LOW (ref 3.87–5.11)
RDW: 18.9 % — AB (ref 11.5–15.5)
WBC: 5.7 10*3/uL (ref 4.0–10.5)

## 2017-02-02 LAB — URINE CULTURE

## 2017-02-02 LAB — BASIC METABOLIC PANEL
Anion gap: 6 (ref 5–15)
CALCIUM: 8.1 mg/dL — AB (ref 8.9–10.3)
CHLORIDE: 104 mmol/L (ref 101–111)
CO2: 26 mmol/L (ref 22–32)
CREATININE: 0.79 mg/dL (ref 0.44–1.00)
GFR calc non Af Amer: >60 mL/min (ref >60)
GLUCOSE: 183 mg/dL — AB (ref 65–99)
Potassium: 3.1 mmol/L — ABNORMAL LOW (ref 3.5–5.1)
Sodium: 136 mmol/L (ref 135–145)

## 2017-02-02 LAB — COMPREHENSIVE METABOLIC PANEL
ALBUMIN: 2.6 g/dL — AB (ref 3.5–5.0)
ALK PHOS: 88 U/L (ref 38–126)
ALT: 31 U/L (ref 14–54)
ANION GAP: 5 (ref 5–15)
AST: 163 U/L — ABNORMAL HIGH (ref 15–41)
BUN: <5 mg/dL — ABNORMAL LOW (ref 6–20)
CALCIUM: 7.8 mg/dL — AB (ref 8.9–10.3)
CHLORIDE: 101 mmol/L (ref 101–111)
CO2: 27 mmol/L (ref 22–32)
Creatinine, Ser: 0.71 mg/dL (ref 0.44–1.00)
GFR calc Af Amer: >60 mL/min (ref >60)
GFR calc non Af Amer: >60 mL/min (ref >60)
GLUCOSE: 250 mg/dL — AB (ref 65–99)
Potassium: 3.2 mmol/L — ABNORMAL LOW (ref 3.5–5.1)
SODIUM: 133 mmol/L — AB (ref 135–145)
Total Bilirubin: 0.8 mg/dL (ref 0.3–1.2)
Total Protein: 5 g/dL — ABNORMAL LOW (ref 6.5–8.1)

## 2017-02-02 MED ORDER — LORAZEPAM 1 MG PO TABS
1.0000 mg | ORAL_TABLET | ORAL | Status: DC | PRN
  Administered 2017-02-02 – 2017-02-05 (×5): 1 mg via ORAL
  Filled 2017-02-02 (×5): qty 1

## 2017-02-02 MED ORDER — POTASSIUM CHLORIDE CRYS ER 10 MEQ PO TBCR
40.0000 meq | EXTENDED_RELEASE_TABLET | ORAL | Status: AC
  Administered 2017-02-02: 40 meq via ORAL
  Filled 2017-02-02: qty 4

## 2017-02-02 NOTE — Consult Note (Addendum)
Reason for Consult: Gallstone pancreatitis with choledocholithiasis. Referring Physician: Dr. Teena Irani  April [name] is an 71 y.o. female.  HPI: Patient presented to the ED on 01/29/17 with 4 days of nausea vomiting and diarrhea. She was afebrile vital signs are stable. On admit her bilirubin was 1.4 lipase was 1692. Potassium was 2.6 creatinine 0.54. WBC was 7.8, hemoglobin 8.8 hematocrit 26.5, platelets 162,000. Urine culture from 48 hours ago was positive for Escherichia coli greater than 100,000. Urinalysis is positive for nitrates. CT scan shows Hazy inflammatory stranding about the pancreas, most prevalent about the pancreatic head and uncinate process, with additional stranding near the pancreatic tail. Findings suggestive of possible acute pancreatitis. This may be related to gallstones given the findings within the gallbladder and common bile duct. No loculated fluid collection. No findings to suggest pancreatic Necrosis.  After admission she was seen by GI, and Dr. Laurence Spates. Ultrasound and MRI confirmed choledocholithiasis. Findings on the MRCP was positive for choledocholithiasis. There is mild intra-and extrahepatic duct dilatation indicative of obstruction. Cholelithiasis without evidence of acute cholecystitis at this time. There were inflammatory changes adjacent to the distal body and tail of the pancreas suggests acute pancreatitis. Small 1 cm adenoma, and a trace volume of ascites fluid.   Patient underwent ERCP on 02/01/17 by Dr. Teena Irani. During the ERCP the major papilla was found and cannulated lower third of the main bile duct was partially obstructed by a narrowing that did not appear to be of stone or mass. They were unable to remove the stone.   Bilirubin is improved and is 0.8. AST is only 163. Glucose 183 potassium 3.1 calcium was 8.1. WBC is 5.7 hemoglobin 7.7 hematocrit 23.4.  With failed ERCP we are asked to see by Dr. Amedeo Plenty. He would like a recommendation  about whether to attempt another ERCP, or or to proceed with cholecystectomy, and attempted stone removal during surgery.    Past Medical History:  Diagnosis Date  . Anxiety   . Current smoker   . Depression   . ETOH abuse   . History of shingles summer 2015  . HTN (hypertension)   . Hyperlipidemia   . Hypertension   . Leg edema   . Neuropathy [facility])     Past Surgical History:  Procedure Laterality Date  . removal of uterine fibroids  2004    Family History  Problem Relation Age of Onset  . Heart disease Father   . Neuropathy Neg Hx     Social History:  reports that she has been smoking Cigarettes.  She has been smoking about 1.00 pack per day. She has never used smokeless tobacco. She reports that she drinks about 0.6 oz of alcohol per week . She reports that she does not use drugs.  Tobacco: One pack per day EtOH: Brandy 2 bottles week Drugs:  None Allergies  Allergen Reactions  . Naltrexone Shortness Of Breath  . Penicillins Rash    Has patient had a PCN reaction causing immediate rash, facial/tongue/throat swelling, SOB or lightheadedness with hypotension: Yes immediate rash  Has patient had a PCN reaction causing severe rash involving mucus membranes or skin necrosis: Yes Has patient had a PCN reaction that required hospitalization": No Has patient had a PCN reaction occurring within the last 10 years: Yes If all of the above answers are "NO", then may proceed with Cephalosporin use.     Prior to Admission medications   Medication Sig Start Date End Date Taking? Authorizing Provider  amitriptyline (ELAVIL) 25 MG tablet Take 25 mg by mouth at bedtime as needed for sleep.  08/27/16  Yes Historical Provider, MD  clonazePAM (KLONOPIN) 0.5 MG tablet Take 0.5 mg by mouth 2 (two) times daily as needed for anxiety.   Yes Historical Provider, MD  gabapentin (NEURONTIN) 300 MG capsule Take 2 capsules (600 mg total) by mouth 3 (three) times daily. 10/29/16  Yes Melvenia Beam, MD  Vitamin D, Ergocalciferol, (DRISDOL) 50000 units CAPS capsule Take 50,000 Units by mouth once a week. 07/31/16  Yes Historical Provider, MD  Aspirin-Acetaminophen-Caffeine (GOODY HEADACHE PO) Take 1 each by mouth daily as needed (headache).    Historical Provider, MD     Results for orders placed or performed during the hospital encounter of 01/29/17 (from the past 48 hour(s))  Culture, Urine     Status: Abnormal   Collection Time: 01/31/17 12:45 PM  Result Value Ref Range   Specimen Description URINE, RANDOM    Special Requests NONE    Culture >=100,000 COLONIES/mL ESCHERICHIA COLI (A)    Report Status 02/02/2017 FINAL    Organism ID, Bacteria ESCHERICHIA COLI (A)       Susceptibility   Escherichia coli - MIC*    AMPICILLIN 8 SENSITIVE Sensitive     CEFAZOLIN <=4 SENSITIVE Sensitive     CEFTRIAXONE <=1 SENSITIVE Sensitive     CIPROFLOXACIN <=0.25 SENSITIVE Sensitive     GENTAMICIN <=1 SENSITIVE Sensitive     IMIPENEM <=0.25 SENSITIVE Sensitive     NITROFURANTOIN <=16 SENSITIVE Sensitive     TRIMETH/SULFA <=20 SENSITIVE Sensitive     AMPICILLIN/SULBACTAM 4 SENSITIVE Sensitive     PIP/TAZO <=4 SENSITIVE Sensitive     Extended ESBL NEGATIVE Sensitive     * >=100,000 COLONIES/mL ESCHERICHIA COLI  Basic metabolic panel     Status: Abnormal   Collection Time: 01/31/17  7:40 PM  Result Value Ref Range   Sodium 137 135 - 145 mmol/L   Potassium 3.1 (L) 3.5 - 5.1 mmol/L   Chloride 104 101 - 111 mmol/L   CO2 25 22 - 32 mmol/L   Glucose, Bld 128 (H) 65 - 99 mg/dL   BUN 5 (L) 6 - 20 mg/dL   Creatinine, Ser 0.45 0.44 - 1.00 mg/dL   Calcium 8.1 (L) 8.9 - 10.3 mg/dL   GFR calc non Af Amer >60 >60 mL/min   GFR calc Af Amer >60 >60 mL/min    Comment: (NOTE) The eGFR has been calculated using the CKD EPI equation. This calculation has not been validated in all clinical situations. eGFR's persistently <60 mL/min signify possible Chronic Kidney Disease.    Anion gap 8 5 - 15   Comprehensive metabolic panel     Status: Abnormal   Collection Time: 02/01/17  5:52 AM  Result Value Ref Range   Sodium 134 (L) 135 - 145 mmol/L   Potassium 2.3 (LL) 3.5 - 5.1 mmol/L    Comment: RESULT REPEATED AND VERIFIED DELTA CHECK NOTED CRITICAL RESULT CALLED TO, READ BACK BY AND VERIFIED WITH: Nelta Numbers RN @ [phone] ON 02/01/17 BY C DAVIS    Chloride 100 (L) 101 - 111 mmol/L   CO2 26 22 - 32 mmol/L   Glucose, Bld 155 (H) 65 - 99 mg/dL   BUN <5 (L) 6 - 20 mg/dL   Creatinine, Ser 0.34 (L) 0.44 - 1.00 mg/dL   Calcium 8.0 (L) 8.9 - 10.3 mg/dL   Total Protein 5.1 (L) 6.5 - 8.1 g/dL  Albumin 2.8 (L) 3.5 - 5.0 g/dL   AST 46 (H) 15 - 41 U/L   ALT 18 14 - 54 U/L   Alkaline Phosphatase 77 38 - 126 U/L   Total Bilirubin 0.4 0.3 - 1.2 mg/dL   GFR calc non Af Amer >60 >60 mL/min   GFR calc Af Amer >60 >60 mL/min    Comment: (NOTE) The eGFR has been calculated using the CKD EPI equation. This calculation has not been validated in all clinical situations. eGFR's persistently <60 mL/min signify possible Chronic Kidney Disease.    Anion gap 8 5 - 15  CBC with Differential/Platelet     Status: Abnormal   Collection Time: 02/01/17  5:52 AM  Result Value Ref Range   WBC 5.8 4.0 - 10.5 K/uL   RBC 2.23 (L) 3.87 - 5.11 MIL/uL   Hemoglobin 8.0 (L) 12.0 - 15.0 g/dL   HCT 23.8 (L) 36.0 - 46.0 %   MCV 106.7 (H) 78.0 - 100.0 fL   MCH 35.9 (H) 26.0 - 34.0 pg   MCHC 33.6 30.0 - 36.0 g/dL   RDW 18.6 (H) 11.5 - 15.5 %   Platelets 138 (L) 150 - 400 K/uL   Neutrophils Relative % 72 %   Neutro Abs 4.1 1.7 - 7.7 K/uL   Lymphocytes Relative 15 %   Lymphs Abs 0.9 0.7 - 4.0 K/uL   Monocytes Relative 12 %   Monocytes Absolute 0.7 0.1 - 1.0 K/uL   Eosinophils Relative 1 %   Eosinophils Absolute 0.1 0.0 - 0.7 K/uL   Basophils Relative 0 %   Basophils Absolute 0.0 0.0 - 0.1 K/uL  Magnesium     Status: Abnormal   Collection Time: 02/01/17  5:52 AM  Result Value Ref Range   Magnesium 1.4 (L) 1.7 -  2.4 mg/dL  Basic metabolic panel     Status: Abnormal   Collection Time: 02/01/17 11:13 AM  Result Value Ref Range   Sodium 136 135 - 145 mmol/L   Potassium 2.6 (LL) 3.5 - 5.1 mmol/L    Comment: CRITICAL RESULT CALLED TO, READ BACK BY AND VERIFIED WITH: P.KAUR RN [id number] A.QUIZON    Chloride 99 (L) 101 - 111 mmol/L   CO2 28 22 - 32 mmol/L   Glucose, Bld 143 (H) 65 - 99 mg/dL   BUN <5 (L) 6 - 20 mg/dL   Creatinine, Ser <0.30 (L) 0.44 - 1.00 mg/dL   Calcium 8.2 (L) 8.9 - 10.3 mg/dL   GFR calc non Af Amer NOT CALCULATED >60 mL/min   GFR calc Af Amer NOT CALCULATED >60 mL/min    Comment: (NOTE) The eGFR has been calculated using the CKD EPI equation. This calculation has not been validated in all clinical situations. eGFR's persistently <60 mL/min signify possible Chronic Kidney Disease.    Anion gap 9 5 - 15  Comprehensive metabolic panel     Status: Abnormal   Collection Time: 02/02/17 12:04 AM  Result Value Ref Range   Sodium 133 (L) 135 - 145 mmol/L   Potassium 3.2 (L) 3.5 - 5.1 mmol/L    Comment: DELTA CHECK NOTED REPEATED TO VERIFY NO VISIBLE HEMOLYSIS    Chloride 101 101 - 111 mmol/L   CO2 27 22 - 32 mmol/L   Glucose, Bld 250 (H) 65 - 99 mg/dL   BUN <5 (L) 6 - 20 mg/dL   Creatinine, Ser 0.71 0.44 - 1.00 mg/dL   Calcium 7.8 (L) 8.9 - 10.3 mg/dL  Total Protein 5.0 (L) 6.5 - 8.1 g/dL   Albumin 2.6 (L) 3.5 - 5.0 g/dL   AST 163 (H) 15 - 41 U/L   ALT 31 14 - 54 U/L   Alkaline Phosphatase 88 38 - 126 U/L   Total Bilirubin 0.8 0.3 - 1.2 mg/dL   GFR calc non Af Amer >60 >60 mL/min   GFR calc Af Amer >60 >60 mL/min    Comment: (NOTE) The eGFR has been calculated using the CKD EPI equation. This calculation has not been validated in all clinical situations. eGFR's persistently <60 mL/min signify possible Chronic Kidney Disease.    Anion gap 5 5 - 15  CBC with Differential/Platelet     Status: Abnormal   Collection Time: 02/02/17  5:53 AM  Result Value Ref Range    WBC 5.7 4.0 - 10.5 K/uL   RBC 2.17 (L) 3.87 - 5.11 MIL/uL   Hemoglobin 7.7 (L) 12.0 - 15.0 g/dL   HCT 23.4 (L) 36.0 - 46.0 %   MCV 107.8 (H) 78.0 - 100.0 fL   MCH 35.5 (H) 26.0 - 34.0 pg   MCHC 32.9 30.0 - 36.0 g/dL   RDW 18.9 (H) 11.5 - 15.5 %   Platelets 172 150 - 400 K/uL   Neutrophils Relative % 71 %   Neutro Abs 4.0 1.7 - 7.7 K/uL   Lymphocytes Relative 9 %   Lymphs Abs 0.5 (L) 0.7 - 4.0 K/uL   Monocytes Relative 18 %   Monocytes Absolute 1.0 0.1 - 1.0 K/uL   Eosinophils Relative 2 %   Eosinophils Absolute 0.1 0.0 - 0.7 K/uL   Basophils Relative 0 %   Basophils Absolute 0.0 0.0 - 0.1 K/uL  Basic metabolic panel     Status: Abnormal   Collection Time: 02/02/17  5:53 AM  Result Value Ref Range   Sodium 136 135 - 145 mmol/L   Potassium 3.1 (L) 3.5 - 5.1 mmol/L   Chloride 104 101 - 111 mmol/L   CO2 26 22 - 32 mmol/L   Glucose, Bld 183 (H) 65 - 99 mg/dL   BUN <5 (L) 6 - 20 mg/dL   Creatinine, Ser 0.79 0.44 - 1.00 mg/dL   Calcium 8.1 (L) 8.9 - 10.3 mg/dL   GFR calc non Af Amer >60 >60 mL/min   GFR calc Af Amer >60 >60 mL/min    Comment: (NOTE) The eGFR has been calculated using the CKD EPI equation. This calculation has not been validated in all clinical situations. eGFR's persistently <60 mL/min signify possible Chronic Kidney Disease.    Anion gap 6 5 - 15    Mr 3d Recon At Scanner  Result Date: 01/31/2017 CLINICAL DATA:  71 year old female with past medical history of alcohol abuse presenting with upper abdominal pain for the past 5 days. Possible choledocholithiasis noted on recent CT the abdomen and pelvis which also demonstrated probable pancreatitis. Followup evaluation. EXAM: MRI ABDOMEN WITHOUT AND WITH CONTRAST (INCLUDING MRCP) TECHNIQUE: Multiplanar multisequence MR imaging of the abdomen was performed both before and after the administration of intravenous contrast. Heavily T2-weighted images of the biliary and pancreatic ducts were obtained, and  three-dimensional MRCP images were rendered by post processing. CONTRAST:  35m MULTIHANCE GADOBENATE DIMEGLUMINE 529 MG/ML IV SOLN COMPARISON:  No prior abdominal MRI. CT the abdomen and pelvis 01/30/2017. FINDINGS: Comment: Study is significantly limited by a large amount of patient respiratory motion. Lower chest: Small bilateral pleural effusions lying dependently. Hepatobiliary: Diffuse loss of signal intensity throughout the  hepatic parenchyma on out of phase dual echo images, compatible with hepatic steatosis. No discrete cystic or solid hepatic lesions are identified. MRCP images demonstrate mild intra and extrahepatic biliary ductal dilatation. There are several small filling defects within the gallbladder, compatible with gallstones. No findings to suggest acute cholecystitis at this time. Common bile duct measures up to 12 mm in the porta hepatis, and there are several small filling defects within the distal aspect of the common bile duct, compatible with choledocholithiasis. The largest of these measure up to 6 mm in diameter (image 15 of series 5). Pancreas: No pancreatic mass. No pancreatic ductal dilatation on MRCP images. Small amount of peripancreatic fluid most evident adjacent to the distal body and tail of the pancreas. Spleen:  Unremarkable. Adrenals/Urinary Tract: 1 cm nodule in the medial limb of the left adrenal gland demonstrates loss of signal intensity on out of phase dual echo images, compatible with an adenoma. Right adrenal gland and bilateral kidneys are normal in appearance. No hydroureteronephrosis in the visualized portions of the abdomen. Stomach/Bowel: Visualized portions are unremarkable. Vascular/Lymphatic: No aneurysm identified in the abdominal vasculature. No lymphadenopathy noted in the abdomen. Other: Trace amount of fluid adjacent to the distal body and tail of the pancreas. Trace amount of perihepatic ascites. Musculoskeletal: No aggressive osseous lesions are identified  in the visualized portions of the skeleton. IMPRESSION: 1. Study is positive for choledocholithiasis with mild intra and extrahepatic biliary ductal dilatation, indicative of obstruction. 2. Cholelithiasis without evidence of acute cholecystitis at this time. 3. Inflammatory changes adjacent to the distal body and tail of the pancreas, suggestive of an acute pancreatitis. 4. Small 1 cm left adrenal adenoma incidentally noted. 5. Trace volume of ascites. Electronically Signed   By: Vinnie Langton M.D.   On: 01/31/2017 16:58   Dg Ercp  Result Date: 02/01/2017 CLINICAL DATA:  Choledocholithiasis with biliary dilatation but MRCP. EXAM: ERCP TECHNIQUE: Multiple spot images obtained with the fluoroscopic device and submitted for interpretation post-procedure. FLUOROSCOPY TIME:  Fluoroscopy Time:  4.2 minutes COMPARISON:  01/31/2017 FINDINGS: Limited retrograde cholangiogram demonstrates mild diffuse biliary dilatation. Distal aspect of the CBD is not well opacified. Distal CBD stricture or obstruction not excluded. Please refer to the ERCP report. IMPRESSION: Limited intraoperative cholangiogram demonstrates mild biliary dilatation diffusely. Limited assessment of the distal CBD. Difficult to exclude obstruction, stricture, or choledocholithiasis. These images were submitted for radiologic interpretation only. Please see the procedural report for the amount of contrast and the fluoroscopy time utilized. Electronically Signed   By: Jerilynn Mages.  Shick M.D.   On: 02/01/2017 16:32   Mr Abdomen Mrcp Moise Boring Contast  Result Date: 01/31/2017 CLINICAL DATA:  71 year old female with past medical history of alcohol abuse presenting with upper abdominal pain for the past 5 days. Possible choledocholithiasis noted on recent CT the abdomen and pelvis which also demonstrated probable pancreatitis. Followup evaluation. EXAM: MRI ABDOMEN WITHOUT AND WITH CONTRAST (INCLUDING MRCP) TECHNIQUE: Multiplanar multisequence MR imaging of the  abdomen was performed both before and after the administration of intravenous contrast. Heavily T2-weighted images of the biliary and pancreatic ducts were obtained, and three-dimensional MRCP images were rendered by post processing. CONTRAST:  41m MULTIHANCE GADOBENATE DIMEGLUMINE 529 MG/ML IV SOLN COMPARISON:  No prior abdominal MRI. CT the abdomen and pelvis 01/30/2017. FINDINGS: Comment: Study is significantly limited by a large amount of patient respiratory motion. Lower chest: Small bilateral pleural effusions lying dependently. Hepatobiliary: Diffuse loss of signal intensity throughout the hepatic parenchyma on out of phase  dual echo images, compatible with hepatic steatosis. No discrete cystic or solid hepatic lesions are identified. MRCP images demonstrate mild intra and extrahepatic biliary ductal dilatation. There are several small filling defects within the gallbladder, compatible with gallstones. No findings to suggest acute cholecystitis at this time. Common bile duct measures up to 12 mm in the porta hepatis, and there are several small filling defects within the distal aspect of the common bile duct, compatible with choledocholithiasis. The largest of these measure up to 6 mm in diameter (image 15 of series 5). Pancreas: No pancreatic mass. No pancreatic ductal dilatation on MRCP images. Small amount of peripancreatic fluid most evident adjacent to the distal body and tail of the pancreas. Spleen:  Unremarkable. Adrenals/Urinary Tract: 1 cm nodule in the medial limb of the left adrenal gland demonstrates loss of signal intensity on out of phase dual echo images, compatible with an adenoma. Right adrenal gland and bilateral kidneys are normal in appearance. No hydroureteronephrosis in the visualized portions of the abdomen. Stomach/Bowel: Visualized portions are unremarkable. Vascular/Lymphatic: No aneurysm identified in the abdominal vasculature. No lymphadenopathy noted in the abdomen. Other: Trace  amount of fluid adjacent to the distal body and tail of the pancreas. Trace amount of perihepatic ascites. Musculoskeletal: No aggressive osseous lesions are identified in the visualized portions of the skeleton. IMPRESSION: 1. Study is positive for choledocholithiasis with mild intra and extrahepatic biliary ductal dilatation, indicative of obstruction. 2. Cholelithiasis without evidence of acute cholecystitis at this time. 3. Inflammatory changes adjacent to the distal body and tail of the pancreas, suggestive of an acute pancreatitis. 4. Small 1 cm left adrenal adenoma incidentally noted. 5. Trace volume of ascites. Electronically Signed   By: Vinnie Langton M.D.   On: 01/31/2017 16:58    Review of Systems  Constitutional: Negative.   HENT: Negative.   Eyes: Negative.   Respiratory: Positive for shortness of breath and wheezing.   Cardiovascular: Negative.   Gastrointestinal: Positive for abdominal pain, constipation (Occasional constipation), diarrhea, heartburn, nausea and vomiting.       She has never had a colonoscopy  Genitourinary: Negative.   Musculoskeletal: Negative.   Skin: Negative.   Neurological: Positive for headaches.  Endo/Heme/Allergies: Negative.   Psychiatric/Behavioral: Positive for depression. The patient is nervous/anxious.        She ports she wakes up at night with anxiety and has to get up. 2 bottles of brandy per week/she has never tried to discontinue brandy.    Blood pressure (!) 102/57, pulse 93, temperature 98.4 F (36.9 C), temperature source Oral, resp. rate 18, height 5' 6" (1.676 m), weight 55.3 kg (122 lb), SpO2 96 %. Physical Exam  Constitutional: She is oriented to person, place, and time. No distress.  Thin cachectic female in no acute distress.  HENT:  Head: Normocephalic and atraumatic.  Mouth/Throat: No oropharyngeal exudate.  Eyes: Right eye exhibits no discharge. Left eye exhibits no discharge. No scleral icterus.  Neck: Normal range of  motion. Neck supple. No JVD present. No tracheal deviation present. No thyromegaly present.  Cardiovascular: Normal rate, regular rhythm, normal heart sounds and intact distal pulses.   No murmur heard. Respiratory: Effort normal and breath sounds normal. No stridor. No respiratory distress. She has no wheezes. She has no rales. She exhibits no tenderness.  GI: Soft. She exhibits distension. She exhibits no mass. There is tenderness. There is no rebound and no guarding.  Decreased bowel sounds. She was started on clears today at lunch. She is  mildly distended and slightly tender. She reports tenderness is worse in the right  upper quadrant.  Musculoskeletal: She exhibits no edema or tenderness.  Lymphadenopathy:    She has no cervical adenopathy.  Neurological: She is alert and oriented to person, place, and time. No cranial nerve deficit.  Skin: Skin is warm and dry. No rash noted. She is not diaphoretic. No erythema. No pallor.  Psychiatric: She has a normal mood and affect. Her behavior is normal. Judgment and thought content normal.    Assessment/Plan: Gallstone pancreatitis Cholelithiasis  Choledocholithiasis. Status post ERCP/sphincterotomy with failure to remove obstruction 02/01/17, Dr. Teena Irani  History of alcohol dependence/neuropathy  Ongoing alcohol use. Ongoing tobacco use Anemia Hypokalemia Hypertension Hyperlipidemia History of shingles Depression 1 cm left adrenal adenoma  Plan: Will discuss with Dr. Lucia Gaskins he will review studies later this afternoon.   I will make her nothing by mouth for possible procedure in the a.m.  JENNINGS,WILLARD 02/02/2017, 12:33 PM   Agree with above. Daughter, Margarito Courser [phone]), at bedside.  Friend of [place], Evans, also at bedside. The daughter is afraid that her mother will not come back to the hospital for any other procedures, if discharged (this may have something to do with her EtOH issues).  The patient started  feeling nauseated around Monday, 2/5.  She said that she just did not feel good. Then last Monday, 2/12 - she developed severe nausea and vomiting.  She does not like to come to the ER - so she stayed at home.  Finally she fell at home on Friday, 2/16, and this brought her to the ER.  Her initial lipase was 1,692. She had an ERCP yesterday by Dr.Hayes - he was able to do sphincterotomy, but unable to clear CBD.    I spoke to Dr. Amedeo Plenty about the ERCP.  I seems reasonable to do a lap chole with IOC.  Try to clear the duct with the laparoscope, but with the probability of needing another ERCP.  I discussed with the patient the indications and risks of gall bladder surgery.  The primary risks of gall bladder surgery include, but are not limited to, bleeding, infection, common bile duct injury, and open surgery.  There is also the risk that the patient may have continued symptoms after surgery.  We discussed the typical post-operative recovery course. I tried to answer the patient's questions.  I spent 30+ minutes with the patient and daughter going over the surgery and possibilities.  Alphonsa Overall, MD, [facility] Pager: [phone] Office phone:  [phone]

## 2017-02-02 NOTE — Progress Notes (Signed)
PROGRESS NOTE    April [name]  ZOX:[id number] DOB: 1946-11-30 DOA: 01/29/2017 PCP: Lilia Argue    Brief Narrative:  71 yo [name] with past medical history of etho abuse, presents with abdominal pain for the last 5 days, epigastric and constant, associated with vomiting. On the initial physical examination, hemodynamically stable, with positive abdominal pain. Lipase 1,692. CT abdomen with pancreatitis with common bile duct dilatation with possible choledocholithiasis. Admitted for further treatment.  MRCP positive for choledocolithiasis, unsuccessful ERCP, consulted surgery for further recommendations.    Assessment & Plan:   Principal Problem:   Gallstone pancreatitis Active Problems:   Alcohol abuse   Hypokalemia   Macrocytic anemia   Metabolic acidosis   1. Acute gallstone pancreatitis. Continue pain controlled with as needed IV morphine, continueIV fluids and IV antiemetics. Patient tolerating well clear liquid diet.   2. Common bile duct dilatation. Bilirubins down to 0.8 with alkaline phosphatase at 88. Patient tolerating clear liquid diet well. Unsuccessful ERCP, follow on surgery recommendations.   3. Hypokalemia and hypomagnesemia.  Continue aggressive K correction, will order 80 meq in 2 divided doses, follow on renal panel in am. Check Mag.   4. Etho dependence. No signs of active withdrawal, will continue with as needed lorazepam.    5. Tobacco abuse. Smoking cessation, continue with 21 mg nicotine patch.   6. Urine infection (likely present on admission). Positive urine culture for  E coli, sensitive to fluoroquinolones. Patient symptomatic but no pyuria. Will continue antibiotic therapy with ciprofloxacin.    DVT prophylaxis:enoxaparin  Code Status:DNR  Family Communication:No family at the bedside Disposition Plan:Home  Consultants:    Procedures: ERCP 02/01/2017  Antimicrobials:   Subjective: Patient with abdominal pain,  mild in intensity across her abdomen, dull in nature, no radiation, no worsening factors, no associated with nausea or vomiting. Had ERCP yesterday.   Objective: Vitals:   02/01/17 1636 02/01/17 2127 02/01/17 2255 02/02/17 0523  BP: (!) 103/59 (!) 98/46 101/63 (!) 102/57  Pulse: 88 81 91 93  Resp: 18 18 20 18   Temp: 97.8 F (36.6 C) 98.5 F (36.9 C) 98.8 F (37.1 C) 98.4 F (36.9 C)  TempSrc: Oral Oral  Oral  SpO2: 94% 95%  96%  Weight:      Height:        Intake/Output Summary (Last 24 hours) at 02/02/17 0848 Last data filed at 02/02/17 0532  Gross per 24 hour  Intake          3114.25 ml  Output                0 ml  Net          3114.25 ml   Filed Weights   01/29/17 2053 01/30/17 0311 02/01/17 1144  Weight: 56.7 kg (125 lb) 55.7 kg (122 lb 12.7 oz) 55.3 kg (122 lb)    Examination:  General exam: not in pain or dyspnea, deconditioned E ENT: Mild pallor, no icterus.  Respiratory system: Mild decreased breath sounds at bases, no wheezing or rales. Respiratory effort normal. Cardiovascular system: S1 & S2 heard, RRR. No JVD, murmurs, rubs, gallops or clicks. No pedal edema. Gastrointestinal system: Abdomen is nondistended, soft and nontender. No organomegaly or masses felt. Normal bowel sounds heard. Central nervous system: Alert and oriented. No focal neurological deficits. Extremities: Symmetric 5 x 5 power. Skin: No rashes, lesions or ulcers   Data Reviewed: I have personally reviewed following labs and imaging studies  CBC:  Recent Labs Lab  01/29/17 2101 01/30/17 0550 01/31/17 0533 02/01/17 0552 02/02/17 0553  WBC 9.0 7.8 8.2 5.8 5.7  NEUTROABS  --   --  6.3 4.1 4.0  HGB 10.6* 8.8* 8.7* 8.0* 7.7*  HCT 32.1* 25.8*  26.5* 26.4* 23.8* 23.4*  MCV 108.8* 108.4* 108.6* 106.7* 107.8*  PLT 206 162 144* 138* 172   Basic Metabolic Panel:  Recent Labs Lab 01/30/17 0550  01/31/17 1940 02/01/17 0552 02/01/17 1113 02/02/17 0004 02/02/17 0553  NA 143  < > 137  134* 136 133* 136  K 2.6*  < > 3.1* 2.3* 2.6* 3.2* 3.1*  CL 113*  < > 104 100* 99* 101 104  CO2 22  < > 25 26 28 27 26   GLUCOSE 94  < > 128* 155* 143* 250* 183*  BUN 13  < > 5* <5* <5* <5* <5*  CREATININE 0.54  < > 0.45 0.34* <0.30* 0.71 0.79  CALCIUM 8.6*  < > 8.1* 8.0* 8.2* 7.8* 8.1*  MG 1.5*  --   --  1.4*  --   --   --   < > = values in this interval not displayed. GFR: Estimated Creatinine Clearance: 57.9 mL/min (by C-G formula based on SCr of 0.79 mg/dL). Liver Function Tests:  Recent Labs Lab 01/29/17 2101 01/30/17 0550 02/01/17 0552 02/02/17 0004  AST 85* April* 46* 163*  ALT 30 21 18 31   ALKPHOS 103 81 77 88  BILITOT 3.1* 1.4* 0.4 0.8  PROT 7.2 5.5* 5.1* 5.0*  ALBUMIN 4.4 3.2* 2.8* 2.6*    Recent Labs Lab 01/29/17 2101 01/31/17 0533  LIPASE 1,692* 164*   No results for input(s): AMMONIA in the last 168 hours. Coagulation Profile:  Recent Labs Lab 01/29/17 2358  INR 0.95   Cardiac Enzymes: No results for input(s): CKTOTAL, CKMB, CKMBINDEX, TROPONINI in the last 168 hours. BNP (last 3 results) No results for input(s): PROBNP in the last 8760 hours. HbA1C: No results for input(s): HGBA1C in the last 72 hours. CBG: No results for input(s): GLUCAP in the last 168 hours. Lipid Profile: No results for input(s): CHOL, HDL, LDLCALC, TRIG, CHOLHDL, LDLDIRECT in the last 72 hours. Thyroid Function Tests: No results for input(s): TSH, T4TOTAL, FREET4, T3FREE, THYROIDAB in the last 72 hours. Anemia Panel: No results for input(s): VITAMINB12, FOLATE, FERRITIN, TIBC, IRON, RETICCTPCT in the last 72 hours. Sepsis Labs: No results for input(s): PROCALCITON, LATICACIDVEN in the last 168 hours.  Recent Results (from the past 240 hour(s))  Culture, Urine     Status: Abnormal   Collection Time: 01/31/17 12:45 PM  Result Value Ref Range Status   Specimen Description URINE, RANDOM  Final   Special Requests NONE  Final   Culture >=100,000 COLONIES/mL ESCHERICHIA COLI  (A)  Final   Report Status 02/02/2017 FINAL  Final   Organism ID, Bacteria ESCHERICHIA COLI (A)  Final      Susceptibility   Escherichia coli - MIC*    AMPICILLIN 8 SENSITIVE Sensitive     CEFAZOLIN <=4 SENSITIVE Sensitive     CEFTRIAXONE <=1 SENSITIVE Sensitive     CIPROFLOXACIN <=0.25 SENSITIVE Sensitive     GENTAMICIN <=1 SENSITIVE Sensitive     IMIPENEM <=0.25 SENSITIVE Sensitive     NITROFURANTOIN <=16 SENSITIVE Sensitive     TRIMETH/SULFA <=20 SENSITIVE Sensitive     AMPICILLIN/SULBACTAM 4 SENSITIVE Sensitive     PIP/TAZO <=4 SENSITIVE Sensitive     Extended ESBL NEGATIVE Sensitive     * >=100,000 COLONIES/mL ESCHERICHIA COLI  Radiology Studies: Mr 3d Recon At Scanner  Result Date: 01/31/2017 CLINICAL DATA:  71 year old [name] with past medical history of alcohol abuse presenting with upper abdominal pain for the past 5 days. Possible choledocholithiasis noted on recent CT the abdomen and pelvis which also demonstrated probable pancreatitis. Followup evaluation. EXAM: MRI ABDOMEN WITHOUT AND WITH CONTRAST (INCLUDING MRCP) TECHNIQUE: Multiplanar multisequence MR imaging of the abdomen was performed both before and after the administration of intravenous contrast. Heavily T2-weighted images of the biliary and pancreatic ducts were obtained, and three-dimensional MRCP images were rendered by post processing. CONTRAST:  11mL MULTIHANCE GADOBENATE DIMEGLUMINE 529 MG/ML IV SOLN COMPARISON:  No prior abdominal MRI. CT the abdomen and pelvis 01/30/2017. FINDINGS: Comment: Study is significantly limited by a large amount of patient respiratory motion. Lower chest: Small bilateral pleural effusions lying dependently. Hepatobiliary: Diffuse loss of signal intensity throughout the hepatic parenchyma on out of phase dual echo images, compatible with hepatic steatosis. No discrete cystic or solid hepatic lesions are identified. MRCP images demonstrate mild intra and extrahepatic biliary  ductal dilatation. There are several small filling defects within the gallbladder, compatible with gallstones. No findings to suggest acute cholecystitis at this time. Common bile duct measures up to 12 mm in the porta hepatis, and there are several small filling defects within the distal aspect of the common bile duct, compatible with choledocholithiasis. The largest of these measure up to 6 mm in diameter (image 15 of series 5). Pancreas: No pancreatic mass. No pancreatic ductal dilatation on MRCP images. Small amount of peripancreatic fluid most evident adjacent to the distal body and tail of the pancreas. Spleen:  Unremarkable. Adrenals/Urinary Tract: 1 cm nodule in the medial limb of the left adrenal gland demonstrates loss of signal intensity on out of phase dual echo images, compatible with an adenoma. Right adrenal gland and bilateral kidneys are normal in appearance. No hydroureteronephrosis in the visualized portions of the abdomen. Stomach/Bowel: Visualized portions are unremarkable. Vascular/Lymphatic: No aneurysm identified in the abdominal vasculature. No lymphadenopathy noted in the abdomen. Other: Trace amount of fluid adjacent to the distal body and tail of the pancreas. Trace amount of perihepatic ascites. Musculoskeletal: No aggressive osseous lesions are identified in the visualized portions of the skeleton. IMPRESSION: 1. Study is positive for choledocholithiasis with mild intra and extrahepatic biliary ductal dilatation, indicative of obstruction. 2. Cholelithiasis without evidence of acute cholecystitis at this time. 3. Inflammatory changes adjacent to the distal body and tail of the pancreas, suggestive of an acute pancreatitis. 4. Small 1 cm left adrenal adenoma incidentally noted. 5. Trace volume of ascites. Electronically Signed   By: Trudie Reed M.D.   On: 01/31/2017 16:58   Dg Ercp  Result Date: 02/01/2017 CLINICAL DATA:  Choledocholithiasis with biliary dilatation but MRCP.  EXAM: ERCP TECHNIQUE: Multiple spot images obtained with the fluoroscopic device and submitted for interpretation post-procedure. FLUOROSCOPY TIME:  Fluoroscopy Time:  4.2 minutes COMPARISON:  01/31/2017 FINDINGS: Limited retrograde cholangiogram demonstrates mild diffuse biliary dilatation. Distal aspect of the CBD is not well opacified. Distal CBD stricture or obstruction not excluded. Please refer to the ERCP report. IMPRESSION: Limited intraoperative cholangiogram demonstrates mild biliary dilatation diffusely. Limited assessment of the distal CBD. Difficult to exclude obstruction, stricture, or choledocholithiasis. These images were submitted for radiologic interpretation only. Please see the procedural report for the amount of contrast and the fluoroscopy time utilized. Electronically Signed   By: Judie Petit.  Shick M.D.   On: 02/01/2017 16:32   Mr Abdomen Mrcp W Wo  Contast  Result Date: 01/31/2017 CLINICAL DATA:  71 year old [name] with past medical history of alcohol abuse presenting with upper abdominal pain for the past 5 days. Possible choledocholithiasis noted on recent CT the abdomen and pelvis which also demonstrated probable pancreatitis. Followup evaluation. EXAM: MRI ABDOMEN WITHOUT AND WITH CONTRAST (INCLUDING MRCP) TECHNIQUE: Multiplanar multisequence MR imaging of the abdomen was performed both before and after the administration of intravenous contrast. Heavily T2-weighted images of the biliary and pancreatic ducts were obtained, and three-dimensional MRCP images were rendered by post processing. CONTRAST:  11mL MULTIHANCE GADOBENATE DIMEGLUMINE 529 MG/ML IV SOLN COMPARISON:  No prior abdominal MRI. CT the abdomen and pelvis 01/30/2017. FINDINGS: Comment: Study is significantly limited by a large amount of patient respiratory motion. Lower chest: Small bilateral pleural effusions lying dependently. Hepatobiliary: Diffuse loss of signal intensity throughout the hepatic parenchyma on out of phase dual  echo images, compatible with hepatic steatosis. No discrete cystic or solid hepatic lesions are identified. MRCP images demonstrate mild intra and extrahepatic biliary ductal dilatation. There are several small filling defects within the gallbladder, compatible with gallstones. No findings to suggest acute cholecystitis at this time. Common bile duct measures up to 12 mm in the porta hepatis, and there are several small filling defects within the distal aspect of the common bile duct, compatible with choledocholithiasis. The largest of these measure up to 6 mm in diameter (image 15 of series 5). Pancreas: No pancreatic mass. No pancreatic ductal dilatation on MRCP images. Small amount of peripancreatic fluid most evident adjacent to the distal body and tail of the pancreas. Spleen:  Unremarkable. Adrenals/Urinary Tract: 1 cm nodule in the medial limb of the left adrenal gland demonstrates loss of signal intensity on out of phase dual echo images, compatible with an adenoma. Right adrenal gland and bilateral kidneys are normal in appearance. No hydroureteronephrosis in the visualized portions of the abdomen. Stomach/Bowel: Visualized portions are unremarkable. Vascular/Lymphatic: No aneurysm identified in the abdominal vasculature. No lymphadenopathy noted in the abdomen. Other: Trace amount of fluid adjacent to the distal body and tail of the pancreas. Trace amount of perihepatic ascites. Musculoskeletal: No aggressive osseous lesions are identified in the visualized portions of the skeleton. IMPRESSION: 1. Study is positive for choledocholithiasis with mild intra and extrahepatic biliary ductal dilatation, indicative of obstruction. 2. Cholelithiasis without evidence of acute cholecystitis at this time. 3. Inflammatory changes adjacent to the distal body and tail of the pancreas, suggestive of an acute pancreatitis. 4. Small 1 cm left adrenal adenoma incidentally noted. 5. Trace volume of ascites. Electronically  Signed   By: Trudie Reedaniel  Entrikin M.D.   On: 01/31/2017 16:58        Scheduled Meds: . ciprofloxacin  400 mg Intravenous Q12H  . enoxaparin (LOVENOX) injection  40 mg Subcutaneous Q24H  . folic acid  1 mg Oral Daily  . gabapentin  600 mg Oral TID  . multivitamin with minerals  1 tablet Oral Daily  . nicotine  21 mg Transdermal Daily  . thiamine  100 mg Oral Daily   Or  . thiamine  100 mg Intravenous Daily   Continuous Infusions: . dextrose 5 % and 0.45% NaCl 75 mL/hr at 02/01/17 2301     LOS: 3 days      Alaia Lordi Annett Gulaaniel Reneka Nebergall, MD [facility] Pager [phone]  If 7PM-7AM, please contact night-coverage [email or web address] Password [email or web address] 02/02/2017, 8:48 AM

## 2017-02-02 NOTE — Progress Notes (Signed)
[facility] Progress Note  Subjective: Patient feeling okay, abdominal pain continues to improve.  Objective: Vital signs in last 24 hours: Temp:  [97.8 F (36.6 C)-99 F (37.2 C)] 98.4 F (36.9 C) (02/20 0523) Pulse Rate:  [73-104] 93 (02/20 0523) Resp:  [16-26] 18 (02/20 0523) BP: (83-127)/(46-69) 102/57 (02/20 0523) SpO2:  [92 %-100 %] 96 % (02/20 0523) Weight:  [55.3 kg (122 lb)] 55.3 kg (122 lb) (02/19 1144) Weight change:    PE: Abdomen soft  Lab Results: Results for orders placed or performed during the hospital encounter of 01/29/17 (from the past 24 hour(s))  Basic metabolic panel     Status: Abnormal   Collection Time: 02/01/17 11:13 AM  Result Value Ref Range   Sodium 136 135 - 145 mmol/L   Potassium 2.6 (LL) 3.5 - 5.1 mmol/L   Chloride 99 (L) 101 - 111 mmol/L   CO2 28 22 - 32 mmol/L   Glucose, Bld 143 (H) 65 - 99 mg/dL   BUN <5 (L) 6 - 20 mg/dL   Creatinine, Ser <9.60 (L) 0.44 - 1.00 mg/dL   Calcium 8.2 (L) 8.9 - 10.3 mg/dL   GFR calc non Af Amer NOT CALCULATED >60 mL/min   GFR calc Af Amer NOT CALCULATED >60 mL/min   Anion gap 9 5 - 15  Comprehensive metabolic panel     Status: Abnormal   Collection Time: 02/02/17 12:04 AM  Result Value Ref Range   Sodium 133 (L) 135 - 145 mmol/L   Potassium 3.2 (L) 3.5 - 5.1 mmol/L   Chloride 101 101 - 111 mmol/L   CO2 27 22 - 32 mmol/L   Glucose, Bld 250 (H) 65 - 99 mg/dL   BUN <5 (L) 6 - 20 mg/dL   Creatinine, Ser 4.54 0.44 - 1.00 mg/dL   Calcium 7.8 (L) 8.9 - 10.3 mg/dL   Total Protein 5.0 (L) 6.5 - 8.1 g/dL   Albumin 2.6 (L) 3.5 - 5.0 g/dL   AST 098 (H) 15 - 41 U/L   ALT 31 14 - 54 U/L   Alkaline Phosphatase 88 38 - 126 U/L   Total Bilirubin 0.8 0.3 - 1.2 mg/dL   GFR calc non Af Amer >60 >60 mL/min   GFR calc Af Amer >60 >60 mL/min   Anion gap 5 5 - 15  CBC with Differential/Platelet     Status: Abnormal   Collection Time: 02/02/17  5:53 AM  Result Value Ref Range   WBC 5.7 4.0 - 10.5 K/uL    RBC 2.17 (L) 3.87 - 5.11 MIL/uL   Hemoglobin 7.7 (L) 12.0 - 15.0 g/dL   HCT 11.9 (L) 14.7 - 82.9 %   MCV 107.8 (H) 78.0 - 100.0 fL   MCH 35.5 (H) 26.0 - 34.0 pg   MCHC 32.9 30.0 - 36.0 g/dL   RDW 56.2 (H) 13.0 - 86.5 %   Platelets 172 150 - 400 K/uL   Neutrophils Relative % 71 %   Neutro Abs 4.0 1.7 - 7.7 K/uL   Lymphocytes Relative 9 %   Lymphs Abs 0.5 (L) 0.7 - 4.0 K/uL   Monocytes Relative 18 %   Monocytes Absolute 1.0 0.1 - 1.0 K/uL   Eosinophils Relative 2 %   Eosinophils Absolute 0.1 0.0 - 0.7 K/uL   Basophils Relative 0 %   Basophils Absolute 0.0 0.0 - 0.1 K/uL  Basic metabolic panel     Status: Abnormal   Collection Time: 02/02/17  5:53 AM  Result  Value Ref Range   Sodium 136 135 - 145 mmol/L   Potassium 3.1 (L) 3.5 - 5.1 mmol/L   Chloride 104 101 - 111 mmol/L   CO2 26 22 - 32 mmol/L   Glucose, Bld 183 (H) 65 - 99 mg/dL   BUN <5 (L) 6 - 20 mg/dL   Creatinine, Ser 0.98 0.44 - 1.00 mg/dL   Calcium 8.1 (L) 8.9 - 10.3 mg/dL   GFR calc non Af Amer >60 >60 mL/min   GFR calc Af Amer >60 >60 mL/min   Anion gap 6 5 - 15    Studies/Results: Mr 3d Recon At Scanner  Result Date: 01/31/2017 CLINICAL DATA:  71 year old female with past medical history of alcohol abuse presenting with upper abdominal pain for the past 5 days. Possible choledocholithiasis noted on recent CT the abdomen and pelvis which also demonstrated probable pancreatitis. Followup evaluation. EXAM: MRI ABDOMEN WITHOUT AND WITH CONTRAST (INCLUDING MRCP) TECHNIQUE: Multiplanar multisequence MR imaging of the abdomen was performed both before and after the administration of intravenous contrast. Heavily T2-weighted images of the biliary and pancreatic ducts were obtained, and three-dimensional MRCP images were rendered by post processing. CONTRAST:  11mL MULTIHANCE GADOBENATE DIMEGLUMINE 529 MG/ML IV SOLN COMPARISON:  No prior abdominal MRI. CT the abdomen and pelvis 01/30/2017. FINDINGS: Comment: Study is significantly  limited by a large amount of patient respiratory motion. Lower chest: Small bilateral pleural effusions lying dependently. Hepatobiliary: Diffuse loss of signal intensity throughout the hepatic parenchyma on out of phase dual echo images, compatible with hepatic steatosis. No discrete cystic or solid hepatic lesions are identified. MRCP images demonstrate mild intra and extrahepatic biliary ductal dilatation. There are several small filling defects within the gallbladder, compatible with gallstones. No findings to suggest acute cholecystitis at this time. Common bile duct measures up to 12 mm in the porta hepatis, and there are several small filling defects within the distal aspect of the common bile duct, compatible with choledocholithiasis. The largest of these measure up to 6 mm in diameter (image 15 of series 5). Pancreas: No pancreatic mass. No pancreatic ductal dilatation on MRCP images. Small amount of peripancreatic fluid most evident adjacent to the distal body and tail of the pancreas. Spleen:  Unremarkable. Adrenals/Urinary Tract: 1 cm nodule in the medial limb of the left adrenal gland demonstrates loss of signal intensity on out of phase dual echo images, compatible with an adenoma. Right adrenal gland and bilateral kidneys are normal in appearance. No hydroureteronephrosis in the visualized portions of the abdomen. Stomach/Bowel: Visualized portions are unremarkable. Vascular/Lymphatic: No aneurysm identified in the abdominal vasculature. No lymphadenopathy noted in the abdomen. Other: Trace amount of fluid adjacent to the distal body and tail of the pancreas. Trace amount of perihepatic ascites. Musculoskeletal: No aggressive osseous lesions are identified in the visualized portions of the skeleton. IMPRESSION: 1. Study is positive for choledocholithiasis with mild intra and extrahepatic biliary ductal dilatation, indicative of obstruction. 2. Cholelithiasis without evidence of acute cholecystitis at  this time. 3. Inflammatory changes adjacent to the distal body and tail of the pancreas, suggestive of an acute pancreatitis. 4. Small 1 cm left adrenal adenoma incidentally noted. 5. Trace volume of ascites. Electronically Signed   By: Trudie Reed M.D.   On: 01/31/2017 16:58   Dg Ercp  Result Date: 02/01/2017 CLINICAL DATA:  Choledocholithiasis with biliary dilatation but MRCP. EXAM: ERCP TECHNIQUE: Multiple spot images obtained with the fluoroscopic device and submitted for interpretation post-procedure. FLUOROSCOPY TIME:  Fluoroscopy Time:  4.2 minutes COMPARISON:  01/31/2017 FINDINGS: Limited retrograde cholangiogram demonstrates mild diffuse biliary dilatation. Distal aspect of the CBD is not well opacified. Distal CBD stricture or obstruction not excluded. Please refer to the ERCP report. IMPRESSION: Limited intraoperative cholangiogram demonstrates mild biliary dilatation diffusely. Limited assessment of the distal CBD. Difficult to exclude obstruction, stricture, or choledocholithiasis. These images were submitted for radiologic interpretation only. Please see the procedural report for the amount of contrast and the fluoroscopy time utilized. Electronically Signed   By: Judie PetitM.  Shick M.D.   On: 02/01/2017 16:32   Mr Abdomen Mrcp Vivien RossettiW Wo Contast  Result Date: 01/31/2017 CLINICAL DATA:  71 year old female with past medical history of alcohol abuse presenting with upper abdominal pain for the past 5 days. Possible choledocholithiasis noted on recent CT the abdomen and pelvis which also demonstrated probable pancreatitis. Followup evaluation. EXAM: MRI ABDOMEN WITHOUT AND WITH CONTRAST (INCLUDING MRCP) TECHNIQUE: Multiplanar multisequence MR imaging of the abdomen was performed both before and after the administration of intravenous contrast. Heavily T2-weighted images of the biliary and pancreatic ducts were obtained, and three-dimensional MRCP images were rendered by post processing. CONTRAST:  11mL  MULTIHANCE GADOBENATE DIMEGLUMINE 529 MG/ML IV SOLN COMPARISON:  No prior abdominal MRI. CT the abdomen and pelvis 01/30/2017. FINDINGS: Comment: Study is significantly limited by a large amount of patient respiratory motion. Lower chest: Small bilateral pleural effusions lying dependently. Hepatobiliary: Diffuse loss of signal intensity throughout the hepatic parenchyma on out of phase dual echo images, compatible with hepatic steatosis. No discrete cystic or solid hepatic lesions are identified. MRCP images demonstrate mild intra and extrahepatic biliary ductal dilatation. There are several small filling defects within the gallbladder, compatible with gallstones. No findings to suggest acute cholecystitis at this time. Common bile duct measures up to 12 mm in the porta hepatis, and there are several small filling defects within the distal aspect of the common bile duct, compatible with choledocholithiasis. The largest of these measure up to 6 mm in diameter (image 15 of series 5). Pancreas: No pancreatic mass. No pancreatic ductal dilatation on MRCP images. Small amount of peripancreatic fluid most evident adjacent to the distal body and tail of the pancreas. Spleen:  Unremarkable. Adrenals/Urinary Tract: 1 cm nodule in the medial limb of the left adrenal gland demonstrates loss of signal intensity on out of phase dual echo images, compatible with an adenoma. Right adrenal gland and bilateral kidneys are normal in appearance. No hydroureteronephrosis in the visualized portions of the abdomen. Stomach/Bowel: Visualized portions are unremarkable. Vascular/Lymphatic: No aneurysm identified in the abdominal vasculature. No lymphadenopathy noted in the abdomen. Other: Trace amount of fluid adjacent to the distal body and tail of the pancreas. Trace amount of perihepatic ascites. Musculoskeletal: No aggressive osseous lesions are identified in the visualized portions of the skeleton. IMPRESSION: 1. Study is positive for  choledocholithiasis with mild intra and extrahepatic biliary ductal dilatation, indicative of obstruction. 2. Cholelithiasis without evidence of acute cholecystitis at this time. 3. Inflammatory changes adjacent to the distal body and tail of the pancreas, suggestive of an acute pancreatitis. 4. Small 1 cm left adrenal adenoma incidentally noted. 5. Trace volume of ascites. Electronically Signed   By: Trudie Reedaniel  Entrikin M.D.   On: 01/31/2017 16:58      Assessment: 1. Gallstone pancreatitis, improving. ERCP partly unsuccessful, unable to advance wire or catheter more than a couple of centimeters with impression of resistance or blockage, sphincterotomy performed but unable to clearly outline a stone or advance a catheter or  basket into the proximal duct to attempt at balloon sweep. 2. Gallstones  Plan: 1.In light of continued improvement of liver function test with complete normalization of bilirubin, it is possible, although unlikely, that she has passed her stones. 2. We'll consult general surgeon for their opinion on proceeding with cholecystectomy with intraoperative cholangiogram to better clarify whether residual stones remain and need for second ERCP attempt. 3. Clear liquids today.    Burak Zerbe C 02/02/2017, 11:03 AM  Pager [phone] If no answer or after 5 PM call [phone]

## 2017-02-03 ENCOUNTER — Inpatient Hospital Stay (HOSPITAL_COMMUNITY): Payer: PPO

## 2017-02-03 ENCOUNTER — Inpatient Hospital Stay (HOSPITAL_COMMUNITY): Payer: PPO | Admitting: Certified Registered Nurse Anesthetist

## 2017-02-03 ENCOUNTER — Encounter (HOSPITAL_COMMUNITY)
Admission: EM | Disposition: A | Discharge status: Home Health | Payer: Self-pay | Source: Home / Self Care | Attending: Internal Medicine

## 2017-02-03 ENCOUNTER — Encounter (HOSPITAL_COMMUNITY): Payer: Self-pay | Admitting: Anesthesiology

## 2017-02-03 DIAGNOSIS — K851 Biliary acute pancreatitis without necrosis or infection: Secondary | ICD-10-CM

## 2017-02-03 DIAGNOSIS — F1010 Alcohol abuse, uncomplicated: Secondary | ICD-10-CM

## 2017-02-03 DIAGNOSIS — D539 Nutritional anemia, unspecified: Secondary | ICD-10-CM

## 2017-02-03 DIAGNOSIS — E876 Hypokalemia: Secondary | ICD-10-CM

## 2017-02-03 HISTORY — PX: LAPAROSCOPIC CHOLECYSTECTOMY WITH COMMON DUCT EXPLORATION AND INTRAOP CHOLANGIOGRAM: SHX6333

## 2017-02-03 LAB — CBC WITH DIFFERENTIAL/PLATELET
BASOS PCT: 0 %
Basophils Absolute: 0 10*3/uL (ref 0.0–0.1)
EOS ABS: 0.1 10*3/uL (ref 0.0–0.7)
Eosinophils Relative: 2 %
HCT: 23.3 % — ABNORMAL LOW (ref 36.0–46.0)
HEMOGLOBIN: 7.7 g/dL — AB (ref 12.0–15.0)
Lymphocytes Relative: 16 %
Lymphs Abs: 0.8 10*3/uL (ref 0.7–4.0)
MCH: 35.8 pg — ABNORMAL HIGH (ref 26.0–34.0)
MCHC: 33 g/dL (ref 30.0–36.0)
MCV: 108.4 fL — ABNORMAL HIGH (ref 78.0–100.0)
MONOS PCT: 19 %
Monocytes Absolute: 0.9 10*3/uL (ref 0.1–1.0)
NEUTROS PCT: 63 %
Neutro Abs: 3 10*3/uL (ref 1.7–7.7)
PLATELETS: 208 10*3/uL (ref 150–400)
RBC: 2.15 MIL/uL — AB (ref 3.87–5.11)
RDW: 19.6 % — ABNORMAL HIGH (ref 11.5–15.5)
WBC: 4.8 10*3/uL (ref 4.0–10.5)

## 2017-02-03 LAB — SURGICAL PCR SCREEN
MRSA, PCR: NEGATIVE
STAPHYLOCOCCUS AUREUS: NEGATIVE

## 2017-02-03 LAB — BASIC METABOLIC PANEL
Anion gap: 3 — ABNORMAL LOW (ref 5–15)
BUN: <5 mg/dL — ABNORMAL LOW (ref 6–20)
CHLORIDE: 106 mmol/L (ref 101–111)
CO2: 29 mmol/L (ref 22–32)
CREATININE: 0.79 mg/dL (ref 0.44–1.00)
Calcium: 8.2 mg/dL — ABNORMAL LOW (ref 8.9–10.3)
GFR calc non Af Amer: >60 mL/min (ref >60)
Glucose, Bld: 171 mg/dL — ABNORMAL HIGH (ref 65–99)
Potassium: 3.2 mmol/L — ABNORMAL LOW (ref 3.5–5.1)
SODIUM: 138 mmol/L (ref 135–145)

## 2017-02-03 LAB — PREALBUMIN: PREALBUMIN: 8 mg/dL — AB (ref 18–38)

## 2017-02-03 LAB — ABO/RH: ABO/RH(D): O POS

## 2017-02-03 LAB — MAGNESIUM: Magnesium: 1.3 mg/dL — ABNORMAL LOW (ref 1.7–2.4)

## 2017-02-03 LAB — PREPARE RBC (CROSSMATCH)

## 2017-02-03 SURGERY — CHOLECYSTECTOMY, LAPAROSCOPIC, WITH CHOLANGIOGRAM AND COMMON BILE DUCT EXPLORATION
Anesthesia: General

## 2017-02-03 MED ORDER — FENTANYL CITRATE (PF) 100 MCG/2ML IJ SOLN
INTRAMUSCULAR | Status: DC | PRN
  Administered 2017-02-03: 100 ug via INTRAVENOUS
  Administered 2017-02-03: 50 ug via INTRAVENOUS

## 2017-02-03 MED ORDER — METOCLOPRAMIDE HCL 5 MG/ML IJ SOLN: 10.0000 mg | Freq: Once | INTRAMUSCULAR | Status: DC | PRN

## 2017-02-03 MED ORDER — MORPHINE SULFATE (PF) 4 MG/ML IV SOLN
1.0000 mg | INTRAVENOUS | Status: DC | PRN
  Administered 2017-02-03: 2 mg via INTRAVENOUS
  Administered 2017-02-04: 3 mg via INTRAVENOUS
  Administered 2017-02-04: 2 mg via INTRAVENOUS
  Administered 2017-02-04: 1 mg via INTRAVENOUS
  Filled 2017-02-03 (×4): qty 1

## 2017-02-03 MED ORDER — SUGAMMADEX SODIUM 200 MG/2ML IV SOLN
INTRAVENOUS | Status: DC | PRN
  Administered 2017-02-03: 200 mg via INTRAVENOUS

## 2017-02-03 MED ORDER — KCL IN DEXTROSE-NACL 40-5-0.9 MEQ/L-%-% IV SOLN
INTRAVENOUS | Status: DC
  Administered 2017-02-03 – 2017-02-04 (×3): via INTRAVENOUS
  Filled 2017-02-03 (×3): qty 1000

## 2017-02-03 MED ORDER — IOPAMIDOL (ISOVUE-300) INJECTION 61%
INTRAVENOUS | Status: DC | PRN
  Administered 2017-02-03: 14 mL

## 2017-02-03 MED ORDER — FENTANYL CITRATE (PF) 250 MCG/5ML IJ SOLN
INTRAMUSCULAR | Status: AC
  Filled 2017-02-03: qty 5

## 2017-02-03 MED ORDER — EPHEDRINE SULFATE-NACL 50-0.9 MG/10ML-% IV SOSY
PREFILLED_SYRINGE | INTRAVENOUS | Status: DC | PRN
  Administered 2017-02-03: 10 mg via INTRAVENOUS

## 2017-02-03 MED ORDER — HYDROMORPHONE HCL 1 MG/ML IJ SOLN
0.2500 mg | INTRAMUSCULAR | Status: DC | PRN
  Administered 2017-02-03: 0.25 mg via INTRAVENOUS

## 2017-02-03 MED ORDER — HYDROMORPHONE HCL 1 MG/ML IJ SOLN
INTRAMUSCULAR | Status: AC
  Filled 2017-02-03: qty 1

## 2017-02-03 MED ORDER — MIDAZOLAM HCL 2 MG/2ML IJ SOLN
INTRAMUSCULAR | Status: AC
  Filled 2017-02-03: qty 2

## 2017-02-03 MED ORDER — SUCCINYLCHOLINE CHLORIDE 20 MG/ML IJ SOLN
INTRAMUSCULAR | Status: DC | PRN
  Administered 2017-02-03: 80 mg via INTRAVENOUS

## 2017-02-03 MED ORDER — PROPOFOL 10 MG/ML IV BOLUS
INTRAVENOUS | Status: AC
  Filled 2017-02-03: qty 20

## 2017-02-03 MED ORDER — PHENYLEPHRINE HCL 10 MG/ML IJ SOLN
INTRAVENOUS | Status: DC | PRN
  Administered 2017-02-03: 30 ug/min via INTRAVENOUS

## 2017-02-03 MED ORDER — 0.9 % SODIUM CHLORIDE (POUR BTL) OPTIME
TOPICAL | Status: DC | PRN
  Administered 2017-02-03: 1000 mL

## 2017-02-03 MED ORDER — PHENYLEPHRINE 40 MCG/ML (10ML) SYRINGE FOR IV PUSH (FOR BLOOD PRESSURE SUPPORT)
PREFILLED_SYRINGE | INTRAVENOUS | Status: AC
  Filled 2017-02-03: qty 10

## 2017-02-03 MED ORDER — ENOXAPARIN SODIUM 40 MG/0.4ML ~~LOC~~ SOLN
40.0000 mg | SUBCUTANEOUS | Status: DC
  Administered 2017-02-04 – 2017-02-06 (×3): 40 mg via SUBCUTANEOUS
  Filled 2017-02-03 (×3): qty 0.4

## 2017-02-03 MED ORDER — BUPIVACAINE HCL (PF) 0.25 % IJ SOLN
INTRAMUSCULAR | Status: AC
  Filled 2017-02-03: qty 30

## 2017-02-03 MED ORDER — LACTATED RINGERS IR SOLN
Status: DC | PRN
  Administered 2017-02-03: 1000 mL

## 2017-02-03 MED ORDER — SODIUM CHLORIDE 0.9 % IV SOLN: Freq: Once | INTRAVENOUS | Status: AC

## 2017-02-03 MED ORDER — LIDOCAINE 2% (20 MG/ML) 5 ML SYRINGE
INTRAMUSCULAR | Status: DC | PRN
  Administered 2017-02-03: 60 mg via INTRAVENOUS

## 2017-02-03 MED ORDER — IOPAMIDOL (ISOVUE-300) INJECTION 61%
INTRAVENOUS | Status: AC
  Filled 2017-02-03: qty 50

## 2017-02-03 MED ORDER — MEPERIDINE HCL 50 MG/ML IJ SOLN
6.2500 mg | INTRAMUSCULAR | Status: DC | PRN
Start: 2017-02-03 — End: 2017-02-03

## 2017-02-03 MED ORDER — PROPOFOL 10 MG/ML IV BOLUS
INTRAVENOUS | Status: DC | PRN
  Administered 2017-02-03: 100 mg via INTRAVENOUS

## 2017-02-03 MED ORDER — SODIUM CHLORIDE 0.9 % IV SOLN
30.0000 meq | Freq: Once | INTRAVENOUS | Status: AC
  Administered 2017-02-03: 30 meq via INTRAVENOUS
  Filled 2017-02-03: qty 15

## 2017-02-03 MED ORDER — ROCURONIUM BROMIDE 10 MG/ML (PF) SYRINGE
PREFILLED_SYRINGE | INTRAVENOUS | Status: DC | PRN
  Administered 2017-02-03: 40 mg via INTRAVENOUS
  Administered 2017-02-03: 10 mg via INTRAVENOUS

## 2017-02-03 MED ORDER — PHENYLEPHRINE 40 MCG/ML (10ML) SYRINGE FOR IV PUSH (FOR BLOOD PRESSURE SUPPORT)
PREFILLED_SYRINGE | INTRAVENOUS | Status: DC | PRN
  Administered 2017-02-03: 120 ug via INTRAVENOUS
  Administered 2017-02-03 (×2): 80 ug via INTRAVENOUS
  Administered 2017-02-03: 120 ug via INTRAVENOUS
  Administered 2017-02-03: 80 ug via INTRAVENOUS
  Administered 2017-02-03: 200 ug via INTRAVENOUS
  Administered 2017-02-03 (×2): 120 ug via INTRAVENOUS

## 2017-02-03 MED ORDER — ONDANSETRON HCL 4 MG/2ML IJ SOLN
INTRAMUSCULAR | Status: AC
  Filled 2017-02-03: qty 2

## 2017-02-03 MED ORDER — MAGNESIUM SULFATE 50 % IJ SOLN
3.0000 g | Freq: Once | INTRAMUSCULAR | Status: AC
  Administered 2017-02-03: 3 g via INTRAVENOUS
  Filled 2017-02-03: qty 6

## 2017-02-03 MED ORDER — HYDROCODONE-ACETAMINOPHEN 5-325 MG PO TABS
1.0000 | ORAL_TABLET | Freq: Four times a day (QID) | ORAL | Status: DC | PRN
  Administered 2017-02-05 – 2017-02-06 (×5): 2 via ORAL
  Filled 2017-02-03 (×5): qty 2

## 2017-02-03 MED ORDER — PHENYLEPHRINE 40 MCG/ML (10ML) SYRINGE FOR IV PUSH (FOR BLOOD PRESSURE SUPPORT)
PREFILLED_SYRINGE | INTRAVENOUS | Status: AC
  Filled 2017-02-03: qty 30

## 2017-02-03 MED ORDER — MIDAZOLAM HCL 5 MG/5ML IJ SOLN
INTRAMUSCULAR | Status: DC | PRN
  Administered 2017-02-03: 2 mg via INTRAVENOUS

## 2017-02-03 MED ORDER — CIPROFLOXACIN IN D5W 400 MG/200ML IV SOLN: 400.0000 mg | Freq: Two times a day (BID) | INTRAVENOUS | Status: DC

## 2017-02-03 MED ORDER — LACTATED RINGERS IV SOLN
INTRAVENOUS | Status: DC | PRN
  Administered 2017-02-03 (×2): via INTRAVENOUS

## 2017-02-03 MED ORDER — BUPIVACAINE HCL (PF) 0.25 % IJ SOLN
INTRAMUSCULAR | Status: DC | PRN
  Administered 2017-02-03: 30 mL

## 2017-02-03 SURGICAL SUPPLY — 46 items
ADH SKN CLS APL DERMABOND .7 (GAUZE/BANDAGES/DRESSINGS) ×1
APL SKNCLS STERI-STRIP NONHPOA (GAUZE/BANDAGES/DRESSINGS)
APPLIER CLIP 5 13 M/L LIGAMAX5 (MISCELLANEOUS)
APPLIER CLIP ROT 10 11.4 M/L (STAPLE) ×3
APR CLP MED LRG 11.4X10 (STAPLE) ×1
APR CLP MED LRG 5 ANG JAW (MISCELLANEOUS)
BAG SPEC RTRVL LRG 6X4 10 (ENDOMECHANICALS) ×1
BENZOIN TINCTURE PRP APPL 2/3 (GAUZE/BANDAGES/DRESSINGS) IMPLANT
CABLE HIGH FREQUENCY MONO STRZ (ELECTRODE) ×3 IMPLANT
CHLORAPREP W/TINT 26ML (MISCELLANEOUS) ×3 IMPLANT
CLIP APPLIE 5 13 M/L LIGAMAX5 (MISCELLANEOUS) IMPLANT
CLIP APPLIE ROT 10 11.4 M/L (STAPLE) IMPLANT
CLOSURE WOUND 1/4X4 (GAUZE/BANDAGES/DRESSINGS)
COVER MAYO STAND STRL (DRAPES) ×3 IMPLANT
COVER SURGICAL LIGHT HANDLE (MISCELLANEOUS) ×3 IMPLANT
DECANTER SPIKE VIAL GLASS SM (MISCELLANEOUS) ×3 IMPLANT
DERMABOND ADVANCED (GAUZE/BANDAGES/DRESSINGS) ×2
DERMABOND ADVANCED .7 DNX12 (GAUZE/BANDAGES/DRESSINGS) IMPLANT
DRAPE C-ARM 42X120 X-RAY (DRAPES) ×5 IMPLANT
ELECT REM PT RETURN 9FT ADLT (ELECTROSURGICAL) ×3
ELECTRODE REM PT RTRN 9FT ADLT (ELECTROSURGICAL) ×1 IMPLANT
ENDOLOOP SUT PDS II  0 18 (SUTURE) ×2
ENDOLOOP SUT PDS II 0 18 (SUTURE) IMPLANT
GLOVE SURG SIGNA 7.5 PF LTX (GLOVE) ×3 IMPLANT
GOWN STRL REUS W/TWL XL LVL3 (GOWN DISPOSABLE) ×9 IMPLANT
HEMOSTAT SURGICEL 4X8 (HEMOSTASIS) IMPLANT
IRRIG SUCT STRYKERFLOW 2 WTIP (MISCELLANEOUS) ×3
IRRIGATION SUCT STRKRFLW 2 WTP (MISCELLANEOUS) ×1 IMPLANT
IV CATH 14GX2 1/4 (CATHETERS) ×3 IMPLANT
IV SET EXTENSION CATH 6 NF (IV SETS) ×3 IMPLANT
KIT BASIN OR (CUSTOM PROCEDURE TRAY) ×3 IMPLANT
KIT LAP CBDE (SET/KITS/TRAYS/PACK) ×2 IMPLANT
POUCH SPECIMEN RETRIEVAL 10MM (ENDOMECHANICALS) ×3 IMPLANT
SCISSORS LAP 5X35 DISP (ENDOMECHANICALS) ×3 IMPLANT
SLEEVE ADV FIXATION 5X100MM (TROCAR) ×3 IMPLANT
STOPCOCK 4 WAY LG BORE MALE ST (IV SETS) ×3 IMPLANT
STRIP CLOSURE SKIN 1/4X4 (GAUZE/BANDAGES/DRESSINGS) IMPLANT
SUT MNCRL AB 4-0 PS2 18 (SUTURE) ×5 IMPLANT
SYR 10ML ECCENTRIC (SYRINGE) ×3 IMPLANT
TOWEL OR 17X26 10 PK STRL BLUE (TOWEL DISPOSABLE) ×3 IMPLANT
TOWEL OR NON WOVEN STRL DISP B (DISPOSABLE) ×3 IMPLANT
TRAY LAPAROSCOPIC (CUSTOM PROCEDURE TRAY) ×3 IMPLANT
TROCAR ADV FIXATION 11X100MM (TROCAR) ×2 IMPLANT
TROCAR ADV FIXATION 5X100MM (TROCAR) ×3 IMPLANT
TROCAR XCEL BLUNT TIP 100MML (ENDOMECHANICALS) ×3 IMPLANT
TUBING INSUF HEATED (TUBING) ×5 IMPLANT

## 2017-02-03 NOTE — Progress Notes (Signed)
PROGRESS NOTE    April [name]  ZOX:[id number] DOB: 1946/11/26 DOA: 01/29/2017 PCP: Lilia Argue    Brief Narrative:  71 yo female with past medical history of etho abuse, presents with abdominal pain for the last 5 days, epigastric and constant, associated with vomiting. On the initial physical examination, hemodynamically stable, with positive abdominal pain. Lipase 1,692. CT abdomen with pancreatitis with common bile duct dilatation with possible choledocholithiasis. Admitted for further treatment.  MRCP positive for choledocolithiasis, unsuccessful ERCP, so general surgery has been consulted and is planning laparoscopic cholecystectomy with IOC 2/21.   Assessment & Plan:   Principal Problem:   Gallstone pancreatitis Active Problems:   Alcohol abuse   Hypokalemia   Macrocytic anemia   Metabolic acidosis   Choledocholithiasis with acute gallstone pancreatitis: LFTs and bilirubin trending downward.  - Lap chole today, post op diet per surgery - Continue pain controlled with as needed IV morphine - ContinueIV fluids and IV antiemetics.  - NPO prior to surgery  Anemia: Acute on chronic.  - Type and screen ordered by surgery this morning. hgb 7.7. Will order transfusion 1u PRBCs given expectation for intraoperative blood loss.   Hypokalemia and hypomagnesemia.   - Continue aggressive K and Mg correction, ordered per surgery this AM - Continue to monitor  EtOH dependence:  - No signs of active withdrawal, will continue with as needed lorazepam - Thiamine and folate    Tobacco abuse: Scant wheezing and long term 1ppd smoking, ?COPD. No evidence of respiratory distress at this time.  - Provided smoking cessation - Continue with 21 mg nicotine patch.   E. coli UTI:  - Continue cipro per sensitivity data. PCN allergy noted.   DVT prophylaxis:Enoxaparin  Code Status:DNR  Family Communication:Daughter at bedside Disposition Plan:Home pending completed work  up and management.  Consultants: - GI - Surgery  Procedures: ERCP 02/01/2017  Antimicrobials:  - Ciprofloxacin  Subjective: Patient with managed abdominal pain "stretching" across bilateral abdomen. No N/V/D right now.   Objective: Vitals:   02/02/17 0523 02/02/17 1457 02/02/17 2112 02/03/17 0600  BP: (!) 102/57 118/72 107/63 (!) 95/59  Pulse: 93 98 89 84  Resp: 18 (!) 22 18 18   Temp: 98.4 F (36.9 C) 98.9 F (37.2 C) 98.5 F (36.9 C) 99 F (37.2 C)  TempSrc: Oral Oral Oral Oral  SpO2: 96% 94% 93% 90%  Weight:      Height:        Intake/Output Summary (Last 24 hours) at 02/03/17 1119 Last data filed at 02/03/17 0504  Gross per 24 hour  Intake             1680 ml  Output                0 ml  Net             1680 ml   Filed Weights   01/29/17 2053 01/30/17 0311 02/01/17 1144  Weight: 56.7 kg (125 lb) 55.7 kg (122 lb 12.7 oz) 55.3 kg (122 lb)    Examination:  General exam: Frail-appearing 70yo F in no distress E ENT: Mild pallor, no icterus.  Respiratory system: Nonlabored on room air, scant end-expiratory wheezing without prolongation.  Cardiovascular system: S1 & S2 heard, RRR. No JVD, murmurs, rubs, gallops or clicks. No pedal edema. Gastrointestinal system: Abdomen is nondistended, soft. +diffuse tenderness to palpation without rebound. No organomegaly or masses felt. Normal bowel sounds heard. Central nervous system: Alert and oriented. No focal neurological deficits. Extremities:  Symmetric 5 x 5 power. Skin: No rashes, lesions or ulcers   Data Reviewed: I have personally reviewed following labs and imaging studies  CBC:  Recent Labs Lab 01/30/17 0550 01/31/17 0533 02/01/17 0552 02/02/17 0553 02/03/17 0621  WBC 7.8 8.2 5.8 5.7 4.8  NEUTROABS  --  6.3 4.1 4.0 3.0  HGB 8.8* 8.7* 8.0* 7.7* 7.7*  HCT 25.8*  26.5* 26.4* 23.8* 23.4* 23.3*  MCV 108.4* 108.6* 106.7* 107.8* 108.4*  PLT 162 144* 138* 172 208   Basic Metabolic Panel:  Recent  Labs Lab 01/30/17 0550  02/01/17 0552 02/01/17 1113 02/02/17 0004 02/02/17 0553 02/03/17 0621  NA 143  < > 134* 136 133* 136 138  K 2.6*  < > 2.3* 2.6* 3.2* 3.1* 3.2*  CL 113*  < > 100* 99* 101 104 106  CO2 22  < > 26 28 27 26 29   GLUCOSE 94  < > 155* 143* 250* 183* 171*  BUN 13  < > <5* <5* <5* <5* <5*  CREATININE 0.54  < > 0.34* <0.30* 0.71 0.79 0.79  CALCIUM 8.6*  < > 8.0* 8.2* 7.8* 8.1* 8.2*  MG 1.5*  --  1.4*  --   --   --  1.3*  < > = values in this interval not displayed. GFR: Estimated Creatinine Clearance: 57.9 mL/min (by C-G formula based on SCr of 0.79 mg/dL). Liver Function Tests:  Recent Labs Lab 01/29/17 2101 01/30/17 0550 02/01/17 0552 02/02/17 0004  AST 85* 50* 46* 163*  ALT 30 21 18 31   ALKPHOS 103 81 77 88  BILITOT 3.1* 1.4* 0.4 0.8  PROT 7.2 5.5* 5.1* 5.0*  ALBUMIN 4.4 3.2* 2.8* 2.6*    Recent Labs Lab 01/29/17 2101 01/31/17 0533  LIPASE 1,692* 164*   No results for input(s): AMMONIA in the last 168 hours. Coagulation Profile:  Recent Labs Lab 01/29/17 2358  INR 0.95   Cardiac Enzymes: No results for input(s): CKTOTAL, CKMB, CKMBINDEX, TROPONINI in the last 168 hours. BNP (last 3 results) No results for input(s): PROBNP in the last 8760 hours. HbA1C: No results for input(s): HGBA1C in the last 72 hours. CBG: No results for input(s): GLUCAP in the last 168 hours. Lipid Profile: No results for input(s): CHOL, HDL, LDLCALC, TRIG, CHOLHDL, LDLDIRECT in the last 72 hours. Thyroid Function Tests: No results for input(s): TSH, T4TOTAL, FREET4, T3FREE, THYROIDAB in the last 72 hours. Anemia Panel: No results for input(s): VITAMINB12, FOLATE, FERRITIN, TIBC, IRON, RETICCTPCT in the last 72 hours. Sepsis Labs: No results for input(s): PROCALCITON, LATICACIDVEN in the last 168 hours.  Recent Results (from the past 240 hour(s))  Culture, Urine     Status: Abnormal   Collection Time: 01/31/17 12:45 PM  Result Value Ref Range Status    Specimen Description URINE, RANDOM  Final   Special Requests NONE  Final   Culture >=100,000 COLONIES/mL ESCHERICHIA COLI (A)  Final   Report Status 02/02/2017 FINAL  Final   Organism ID, Bacteria ESCHERICHIA COLI (A)  Final      Susceptibility   Escherichia coli - MIC*    AMPICILLIN 8 SENSITIVE Sensitive     CEFAZOLIN <=4 SENSITIVE Sensitive     CEFTRIAXONE <=1 SENSITIVE Sensitive     CIPROFLOXACIN <=0.25 SENSITIVE Sensitive     GENTAMICIN <=1 SENSITIVE Sensitive     IMIPENEM <=0.25 SENSITIVE Sensitive     NITROFURANTOIN <=16 SENSITIVE Sensitive     TRIMETH/SULFA <=20 SENSITIVE Sensitive     AMPICILLIN/SULBACTAM 4 SENSITIVE  Sensitive     PIP/TAZO <=4 SENSITIVE Sensitive     Extended ESBL NEGATIVE Sensitive     * >=100,000 COLONIES/mL ESCHERICHIA COLI         Radiology Studies: Dg Ercp  Result Date: 02/01/2017 CLINICAL DATA:  Choledocholithiasis with biliary dilatation but MRCP. EXAM: ERCP TECHNIQUE: Multiple spot images obtained with the fluoroscopic device and submitted for interpretation post-procedure. FLUOROSCOPY TIME:  Fluoroscopy Time:  4.2 minutes COMPARISON:  01/31/2017 FINDINGS: Limited retrograde cholangiogram demonstrates mild diffuse biliary dilatation. Distal aspect of the CBD is not well opacified. Distal CBD stricture or obstruction not excluded. Please refer to the ERCP report. IMPRESSION: Limited intraoperative cholangiogram demonstrates mild biliary dilatation diffusely. Limited assessment of the distal CBD. Difficult to exclude obstruction, stricture, or choledocholithiasis. These images were submitted for radiologic interpretation only. Please see the procedural report for the amount of contrast and the fluoroscopy time utilized. Electronically Signed   By: Judie PetitM.  Shick M.D.   On: 02/01/2017 16:32    Scheduled Meds: . ciprofloxacin  400 mg Intravenous Q12H  . enoxaparin (LOVENOX) injection  40 mg Subcutaneous Q24H  . folic acid  1 mg Oral Daily  . gabapentin  600  mg Oral TID  . multivitamin with minerals  1 tablet Oral Daily  . nicotine  21 mg Transdermal Daily  . potassium chloride (KCL MULTIRUN) 30 mEq in 265 mL IVPB  30 mEq Intravenous Once  . thiamine  100 mg Oral Daily   Or  . thiamine  100 mg Intravenous Daily   Continuous Infusions: . dextrose 5 % and 0.9 % NaCl with KCl 40 mEq/L 100 mL/hr at 02/03/17 0929     LOS: 4 days   Hazeline Junkeryan Jandel Patriarca, MD [facility] Pager [phone]  If 7PM-7AM, please contact night-coverage [email or web address] Password [email or web address] 02/03/2017, 11:19 AM

## 2017-02-03 NOTE — Anesthesia Procedure Notes (Signed)
Procedure Name: Intubation Performed by: Gean Maidens Pre-anesthesia Checklist: Patient identified, Emergency Drugs available, Suction available, Patient being monitored and Timeout performed Patient Re-evaluated:Patient Re-evaluated prior to inductionOxygen Delivery Method: Circle system utilized Preoxygenation: Pre-oxygenation with 100% oxygen Intubation Type: IV induction Ventilation: Mask ventilation without difficulty Laryngoscope Size: Mac and 3 Grade View: Grade II Tube type: Oral Tube size: 7.0 mm Number of attempts: 1 Airway Equipment and Method: Stylet Placement Confirmation: ETT inserted through vocal cords under direct vision,  positive ETCO2,  CO2 detector and breath sounds checked- equal and bilateral Secured at: 21 cm Tube secured with: Tape Dental Injury: Teeth and Oropharynx as per pre-operative assessment

## 2017-02-03 NOTE — Op Note (Signed)
01/29/2017 - 02/03/2017  4:43 PM  PATIENT:  April [name], 71 y.o., female, MRN: [id number]  PREOP DIAGNOSIS:  Cholelithiasis, Choledocholithiasis  POSTOP DIAGNOSIS:   Chronic cholecystitis, cholelithiasis, choledocholithiasis  PROCEDURE:   Procedure(s): LAPAROSCOPIC CHOLECYSTECTOMY with INTRAOP CHOLANGIOGRAM, Transcystic duct COMMON DUCT EXPLORATION with choledochoscope [photos in chart]  SURGEON:   Alphonsa Overall, M.D.  ASSISTANT:   None  ANESTHESIA:   general  Anesthesiologist: Suzette Battiest, MD CRNA: Gean Maidens, CRNA; Lind Covert, CRNA  General  ASA: 3  EBL:  minimal  ml  BLOOD ADMINISTERED: none  DRAINS: none   LOCAL MEDICATIONS USED:   30 cc of 1/4% marcaine  SPECIMEN:   Gall bladder  COUNTS CORRECT:  YES  INDICATIONS FOR PROCEDURE:  April [name] is a 71 y.o. (DOB: 10-14-1946) white female whose primary care physician is KAPLAN,KRISTEN, PA-C and comes for cholecystectomy and possible common bile duct exploration.   [name] presented on Friday, 01/29/2017 with pancreatitis.  She has a drinking history, but the pancreatitis was felt to be due to common bile duct stones.  Dr. Amedeo Plenty did an ERCP on Monday, 02/01/2017, and able to do a sphincterotomy, but unable to clear the CBD.  I am going to do a lap chole and see what I can do with the management of the CBD.    The indications and risks of the gall bladder surgery were explained to the patient.  The risks include, but are not limited to, infection, bleeding, common bile duct injury and open surgery.  SURGERY:  The patient was taken to OR room #1 at [facility].  The abdomen was prepped with chloroprep.  The patient was given Cipro prior to the beginning of the operation.   A time out was held and the surgical checklist run.   An infraumbilical incision was made into the abdominal cavity.  A 12 mm Hasson trocar was inserted into the abdominal cavity through the infraumbilical incision and  secured with a 0 Vicryl suture.  Three additional trocars were inserted: a 10 mm trocar in the sub-xiphoid location, a 5 mm trocar in the right mid subcostal area, and a 5 mm trocar in the right lateral subcostal area.   The abdomen was explored and the liver, stomach, and bowel that could be seen were unremarkable.  She has some adhesions in the right lower abdomen.   The gall bladder was chronically inflamed with a phrygian cap on the gall bladder.   I grasped the gall bladder and rotated it cephalad.  Disssection was carried down to the gall bladder/cystic duct junction and the cystic duct isolated.  A clip was placed on the gall bladder side of the cystic duct.   An intra-operative cholangiogram was shot.   The intra-operative cholangiogram was shot using a cut off Taut catheter placed through a 14 gauge angiocath in the RUQ.  The Taut catheter was inserted in the cut cystic duct and secured with an endoclip.  A cholangiogram was shot with 8 cc of 1/2 strength Isoview.  Using fluoroscopy, the cholangiogram showed the flow of contrast into the common bile duct, up the hepatic radicals, and into the duodenum.  There were at least 3 stones in the common bile duct.  There was limited drainage into the duodenum.   With the common bile duct stones, I proceeded with a laparoscopic CBD exploration.  I first passed a slippery wire into the cystic duct and into the common duct. I then used the  Taut common duct kit balloon to dilate the cystic duct to 6 mm.  I was then able to pass the choledochoscope through the cystic duct into the common bile duct. There were multiple stones and debris in the common bile duct. I was able to pass the choledochoscope into the duodenum and the ampulla of the common bile duct was fairly wide open consistent with a prior sphincterotomy. I was able one by one to push the stones and debris into the duodenum.   I took pictures with the choledochoscope to show the stones and the  duodenum.   I then shot another cholangiogram using the taut catheter. I injected about 10 mL of 1/2 strength Isovue. This time the images showed no common bile duct stones. But also the common bile duct did not appear to empty. I then removed the Taut catheter and reinserted the choledochoscope which I advanced into the duodenum without difficulty. I think the delayed emptying of the common bile duct was probably due to ampullary edema.   The Taut catheter was removed.  The cystic duct was doubly endoclipped and a PDS endoloop placed around the cystic duct.  The cystic artery was identified and clipped.  The gall bladder was bluntly and sharpley dissected from the gall bladder bed.   After the gall bladder was removed from the liver, the gall bladder bed and [place] were inspected.  There was no bleeding or bile leak.  The gall bladder was placed in a endocatch bag and delivered through the umbilicus.  The abdomen was irrigated with 3,000 cc saline.   The trocars were then removed.  I infiltrated 30cc of 1/4% Marcaine into the incisions.  The umbilical port closed with a 0 Vicryl suture and the skin closed with 4-0 Monocryl.  The skin was painted with DermaBond.  The patient's sponge and needle count were correct.  The patient was transported to the RR in good condition.  Alphonsa Overall, MD, [facility] Pager: [phone] Office phone:  [phone]

## 2017-02-03 NOTE — Anesthesia Postprocedure Evaluation (Signed)
Anesthesia Post Note  Patient: Venancio PoissonDeborah S Saliba  Procedure(s) Performed: Procedure(s) (LRB): LAPAROSCOPIC CHOLECYSTECTOMY WITH POSSIBLE COMMON DUCT EXPLORATION AND INTRAOP CHOLANGIOGRAM (N/A)  Patient location during evaluation: PACU Anesthesia Type: General Level of consciousness: awake and alert Pain management: pain level controlled Vital Signs Assessment: post-procedure vital signs reviewed and stable Respiratory status: spontaneous breathing, nonlabored ventilation, respiratory function stable and patient connected to nasal cannula oxygen Cardiovascular status: blood pressure returned to baseline and stable Postop Assessment: no signs of nausea or vomiting Anesthetic complications: no       Last Vitals:  Vitals:   02/03/17 1700 02/03/17 1707  BP: (!) 91/59   Pulse: 82 81  Resp: 12 11  Temp:  36.4 C    Last Pain:  Vitals:   02/03/17 1707  TempSrc:   PainSc: Ardean LarsenAsleep                 Ovid Witman E

## 2017-02-03 NOTE — Progress Notes (Signed)
When MD ordered unit of blood, he gave verbal consent for blood to be given after surgery, when patient returned to unit.

## 2017-02-03 NOTE — Anesthesia Preprocedure Evaluation (Signed)
Anesthesia Evaluation  Patient identified by MRN, date of birth, ID band Patient awake    Reviewed: Allergy & Precautions, NPO status , Patient's Chart, lab work & pertinent test results  Airway Mallampati: II       Dental  (+) Teeth Intact, Dental Advisory Given   Pulmonary Current Smoker,    breath sounds clear to auscultation       Cardiovascular hypertension, + Peripheral Vascular Disease  Normal cardiovascular exam Rhythm:Regular Rate:Normal     Neuro/Psych PSYCHIATRIC DISORDERS Anxiety Depression Peripheral neuropathy  Neuromuscular disease    GI/Hepatic (+)     substance abuse  alcohol use, Choledocholithiasis Acute pancreatitis   Endo/Other  negative endocrine ROS  Renal/GU negative Renal ROS  negative genitourinary   Musculoskeletal negative musculoskeletal ROS (+)   Abdominal (+)  Abdomen: tender.    Peds negative pediatric ROS (+)  Hematology negative hematology ROS (+) anemia ,   Anesthesia Other Findings - HLD  Reproductive/Obstetrics negative OB ROS                             Lab Results  Component Value Date   WBC 4.8 02/03/2017   HGB 7.7 (L) 02/03/2017   HCT 23.3 (L) 02/03/2017   MCV 108.4 (H) 02/03/2017   PLT 208 02/03/2017   Lab Results  Component Value Date   CREATININE 0.79 02/03/2017   BUN <5 (L) 02/03/2017   NA 138 02/03/2017   K 3.2 (L) 02/03/2017   CL 106 02/03/2017   CO2 29 02/03/2017   Lab Results  Component Value Date   INR 0.95 01/29/2017   05/2016 EKG: normal sinus rhythm.  Anesthesia Physical  Anesthesia Plan  ASA: II  Anesthesia Plan: General   Post-op Pain Management:    Induction: Intravenous, Rapid sequence and Cricoid pressure planned  Airway Management Planned: Oral ETT  Additional Equipment:   Intra-op Plan:   Post-operative Plan: Extubation in OR  Informed Consent: I have reviewed the patients History and  Physical, chart, labs and discussed the procedure including the risks, benefits and alternatives for the proposed anesthesia with the patient or authorized representative who has indicated his/her understanding and acceptance.   Dental advisory given  Plan Discussed with: CRNA, Anesthesiologist and Surgeon  Anesthesia Plan Comments:         Anesthesia Quick Evaluation

## 2017-02-03 NOTE — Transfer of Care (Signed)
Immediate Anesthesia Transfer of Care Note  Patient: April PoissonDeborah S [name]  Procedure(s) Performed: Procedure(s): LAPAROSCOPIC CHOLECYSTECTOMY WITH POSSIBLE COMMON DUCT EXPLORATION AND INTRAOP CHOLANGIOGRAM (N/A)  Patient Location: PACU  Anesthesia Type:General  Level of Consciousness: awake, alert  and oriented  Airway & Oxygen Therapy: Patient Spontanous Breathing and Patient connected to face mask oxygen  Post-op Assessment: Report given to RN and Post -op Vital signs reviewed and stable  Post vital signs: Reviewed and stable  Last Vitals:  Vitals:   02/02/17 2112 02/03/17 0600  BP: 107/63 (!) 95/59  Pulse: 89 84  Resp: 18 18  Temp: 36.9 C 37.2 C    Last Pain:  Vitals:   02/03/17 1250  TempSrc:   PainSc: 7       Patients Stated Pain Goal: 3 (02/03/17 1250)  Complications: No apparent anesthesia complications

## 2017-02-03 NOTE — Progress Notes (Signed)
Attempted x 3 to start additional iv in right forearm.  Iv catheter would initially thread in, then collapse after several minutes of fluid infusing.  Gauze dressings to site.  Stopped current iv fluids to left forearm iv site and changed to LR for surgical case.  CRNA will attempt additional iv during case if deemed necessary.

## 2017-02-03 NOTE — Care Management Important Message (Signed)
Important Message  Patient Details  Name: April [name] MRN: [id number] Date of Birth: 01/08/1946   Medicare Important Message Given:  Yes    Caren MacadamFuller, Amogh Komatsu 02/03/2017, 10:56 AMImportant Message  Patient Details  Name: April PoissonDeborah S [name] MRN: [id number] Date of Birth: 11/25/1946   Medicare Important Message Given:  Yes    Caren MacadamFuller, Sabrina Keough 02/03/2017, 10:56 AM

## 2017-02-04 ENCOUNTER — Encounter (HOSPITAL_COMMUNITY): Payer: Self-pay | Admitting: Surgery

## 2017-02-04 ENCOUNTER — Inpatient Hospital Stay (HOSPITAL_COMMUNITY): Payer: PPO

## 2017-02-04 DIAGNOSIS — E872 Acidosis: Secondary | ICD-10-CM

## 2017-02-04 LAB — COMPREHENSIVE METABOLIC PANEL
ALK PHOS: 159 U/L — AB (ref 38–126)
ALT: 64 U/L — AB (ref 14–54)
AST: 202 U/L — ABNORMAL HIGH (ref 15–41)
Albumin: 2.7 g/dL — ABNORMAL LOW (ref 3.5–5.0)
Anion gap: 5 (ref 5–15)
BILIRUBIN TOTAL: 1 mg/dL (ref 0.3–1.2)
BUN: <5 mg/dL — ABNORMAL LOW (ref 6–20)
CALCIUM: 8.1 mg/dL — AB (ref 8.9–10.3)
CO2: 25 mmol/L (ref 22–32)
CREATININE: 0.95 mg/dL (ref 0.44–1.00)
Chloride: 108 mmol/L (ref 101–111)
GFR, EST NON AFRICAN AMERICAN: 60 mL/min — AB (ref >60)
Glucose, Bld: 221 mg/dL — ABNORMAL HIGH (ref 65–99)
Potassium: 4.3 mmol/L (ref 3.5–5.1)
Sodium: 138 mmol/L (ref 135–145)
Total Protein: 5.3 g/dL — ABNORMAL LOW (ref 6.5–8.1)

## 2017-02-04 LAB — CBC
HCT: 31.4 % — ABNORMAL LOW (ref 36.0–46.0)
HEMOGLOBIN: 10.2 g/dL — AB (ref 12.0–15.0)
MCH: 33.9 pg (ref 26.0–34.0)
MCHC: 32.5 g/dL (ref 30.0–36.0)
MCV: 104.3 fL — AB (ref 78.0–100.0)
Platelets: 268 10*3/uL (ref 150–400)
RBC: 3.01 MIL/uL — AB (ref 3.87–5.11)
RDW: 26.2 % — ABNORMAL HIGH (ref 11.5–15.5)
WBC: 7.6 10*3/uL (ref 4.0–10.5)

## 2017-02-04 LAB — TYPE AND SCREEN
ABO/RH(D): O POS
Antibody Screen: NEGATIVE
Unit division: 0

## 2017-02-04 MED ORDER — KCL IN DEXTROSE-NACL 20-5-0.45 MEQ/L-%-% IV SOLN
INTRAVENOUS | Status: DC
  Administered 2017-02-04 – 2017-02-05 (×2): via INTRAVENOUS
  Filled 2017-02-04 (×2): qty 1000

## 2017-02-04 MED ORDER — ACETAMINOPHEN 325 MG PO TABS
650.0000 mg | ORAL_TABLET | Freq: Four times a day (QID) | ORAL | Status: DC | PRN
  Administered 2017-02-04 – 2017-02-05 (×2): 650 mg via ORAL
  Filled 2017-02-04 (×2): qty 2

## 2017-02-04 MED ORDER — VITAMIN B-12 1000 MCG PO TABS
1000.0000 ug | ORAL_TABLET | Freq: Every day | ORAL | Status: DC
  Administered 2017-02-04 – 2017-02-06 (×3): 1000 ug via ORAL
  Filled 2017-02-04 (×3): qty 1

## 2017-02-04 NOTE — Progress Notes (Signed)
PROGRESS NOTE    April PoissonDeborah S [name]  GNF:[id number] DOB: 10/31/1946 DOA: 01/29/2017 PCP: April ArgueKAPLAN,KRISTEN, [name]   Brief Narrative:  71 yo female with past medical history of etho abuse, presents with abdominal pain for the last 5 days, epigastric and constant, associated with vomiting. On the initial physical examination, hemodynamically stable, with positive abdominal pain. Lipase 1,692. CT abdomen with pancreatitis with common bile duct dilatation with possible choledocholithiasis. Admitted for further treatment.  MRCP positive for choledocolithiasis, unsuccessful ERCP, so general surgery was consulted and performed laparoscopic cholecystectomy with removal of CBD stones into the duodenum. Preoperative anemia prompted 1u PRBCs transfusion with subsequent bump in hemoglobin to 10.2.    Assessment & Plan:   Principal Problem:   Gallstone pancreatitis Active Problems:   Alcohol abuse   Hypokalemia   Macrocytic anemia   Metabolic acidosis  Choledocholithiasis with acute gallstone pancreatitis s/p laparoscopic cholecystectomy w/removal of CBD stones into duodenum by April [name] 2/21: LFTs and bilirubin trending downward.  - Post op diet per surgery, currently clear liquids.  - Pain controlled with as needed IV morphine - ContinueIV fluids and IV antiemetics.   Anemia: Acute on chronic, macrocytic due to alcohol abuse.  - Robust response to 1u PRBCs transfused (hgb 7.7 > 10.2).  - Give Vitamin B12 po, on 2/17 level was low normal at 294. Giving folate.  Hypokalemia and hypomagnesemia: Currently resolved. - Continue to monitor  EtOH dependence:  - No signs of active withdrawal, will continue with as needed lorazepam - Thiamine and folate    Tobacco abuse: Scant wheezing and long term 1ppd smoking, ?COPD. No evidence of respiratory distress at this time.  - Provided smoking cessation - Continue with 21 mg nicotine patch.   E. coli UTI:  - Continue cipro per sensitivity data. PCN  allergy noted.   DVT prophylaxis:Enoxaparin  Code Status:DNR  Family Communication:Daughter at bedside Disposition Plan:Home pending completed work up and management. Advancing diet, PT eval tomorrow for possible disposition.  Consultants: - GI - Surgery  Procedures: ERCP 02/01/2017  Antimicrobials:  - Ciprofloxacin  Subjective: Endorsing well managed abdominal pain and feeling very fatigued since surgery yesterday. did not eat yesterday. No N/V/D right now.   Objective: Vitals:   02/03/17 1930 02/03/17 2040 02/04/17 0006 02/04/17 0630  BP: 125/86 111/76 101/66 118/65  Pulse: 91 91 93 (!) 102  Resp: 16 16 16 16   Temp: 97.9 F (36.6 C) 98.6 F (37 C) 99 F (37.2 C) 98 F (36.7 C)  TempSrc: Oral Oral Oral Axillary  SpO2: 99% 100% 100% 94%  Weight:      Height:        Intake/Output Summary (Last 24 hours) at 02/04/17 0957 Last data filed at 02/04/17 0600  Gross per 24 hour  Intake          3263.33 ml  Output              226 ml  Net          3037.33 ml   Filed Weights   01/29/17 2053 01/30/17 0311 02/01/17 1144  Weight: 56.7 kg (125 lb) 55.7 kg (122 lb 12.7 oz) 55.3 kg (122 lb)    Examination:  General exam: Frail-appearing 70yo F in no distress E ENT: Mild pallor, no icterus.  Respiratory system: Nonlabored on room air, scant end-expiratory wheezing without prolongation.  Cardiovascular system: S1 & S2 heard, RRR. No JVD, murmurs, rubs, gallops or clicks. No pedal edema. Gastrointestinal system: Abdomen is nondistended, soft. +diffuse  tenderness to palpation without rebound. No organomegaly or masses felt. Normal bowel sounds heard. Central nervous system: Alert and oriented. No focal neurological deficits. Extremities: Symmetric 5 x 5 power. Skin: No rashes, lesions or ulcers   Data Reviewed: I have personally reviewed following labs and imaging studies  CBC:  Recent Labs Lab 01/31/17 0533 02/01/17 0552 02/02/17 0553 02/03/17 0621  02/04/17 0541  WBC 8.2 5.8 5.7 4.8 7.6  NEUTROABS 6.3 4.1 4.0 3.0  --   HGB 8.7* 8.0* 7.7* 7.7* 10.2*  HCT 26.4* 23.8* 23.4* 23.3* 31.4*  MCV 108.6* 106.7* 107.8* 108.4* 104.3*  PLT 144* 138* 172 208 268   Basic Metabolic Panel:  Recent Labs Lab 01/30/17 0550  02/01/17 0552 02/01/17 1113 02/02/17 0004 02/02/17 0553 02/03/17 0621 02/04/17 0541  NA 143  < > 134* 136 133* 136 138 138  K 2.6*  < > 2.3* 2.6* 3.2* 3.1* 3.2* 4.3  CL 113*  < > 100* 99* 101 104 106 108  CO2 22  < > 26 28 27 26 29 25   GLUCOSE 94  < > 155* 143* 250* 183* 171* 221*  BUN 13  < > <5* <5* <5* <5* <5* <5*  CREATININE 0.54  < > 0.34* <0.30* 0.71 0.79 0.79 0.95  CALCIUM 8.6*  < > 8.0* 8.2* 7.8* 8.1* 8.2* 8.1*  MG 1.5*  --  1.4*  --   --   --  1.3*  --   < > = values in this interval not displayed. GFR: Estimated Creatinine Clearance: 48.8 mL/min (by C-G formula based on SCr of 0.95 mg/dL). Liver Function Tests:  Recent Labs Lab 01/29/17 2101 01/30/17 0550 02/01/17 0552 02/02/17 0004 02/04/17 0541  AST 85* 50* 46* 163* 202*  ALT 30 21 18 31  64*  ALKPHOS 103 81 77 88 159*  BILITOT 3.1* 1.4* 0.4 0.8 1.0  PROT 7.2 5.5* 5.1* 5.0* 5.3*  ALBUMIN 4.4 3.2* 2.8* 2.6* 2.7*    Recent Labs Lab 01/29/17 2101 01/31/17 0533  LIPASE 1,692* 164*   No results for input(s): AMMONIA in the last 168 hours. Coagulation Profile:  Recent Labs Lab 01/29/17 2358  INR 0.95   Cardiac Enzymes: No results for input(s): CKTOTAL, CKMB, CKMBINDEX, TROPONINI in the last 168 hours. BNP (last 3 results) No results for input(s): PROBNP in the last 8760 hours. HbA1C: No results for input(s): HGBA1C in the last 72 hours. CBG: No results for input(s): GLUCAP in the last 168 hours. Lipid Profile: No results for input(s): CHOL, HDL, LDLCALC, TRIG, CHOLHDL, LDLDIRECT in the last 72 hours. Thyroid Function Tests: No results for input(s): TSH, T4TOTAL, FREET4, T3FREE, THYROIDAB in the last 72 hours. Anemia Panel: No  results for input(s): VITAMINB12, FOLATE, FERRITIN, TIBC, IRON, RETICCTPCT in the last 72 hours. Sepsis Labs: No results for input(s): PROCALCITON, LATICACIDVEN in the last 168 hours.  Recent Results (from the past 240 hour(s))  Culture, Urine     Status: Abnormal   Collection Time: 01/31/17 12:45 PM  Result Value Ref Range Status   Specimen Description URINE, RANDOM  Final   Special Requests NONE  Final   Culture >=100,000 COLONIES/mL ESCHERICHIA COLI (A)  Final   Report Status 02/02/2017 FINAL  Final   Organism ID, Bacteria ESCHERICHIA COLI (A)  Final      Susceptibility   Escherichia coli - MIC*    AMPICILLIN 8 SENSITIVE Sensitive     CEFAZOLIN <=4 SENSITIVE Sensitive     CEFTRIAXONE <=1 SENSITIVE Sensitive  CIPROFLOXACIN <=0.25 SENSITIVE Sensitive     GENTAMICIN <=1 SENSITIVE Sensitive     IMIPENEM <=0.25 SENSITIVE Sensitive     NITROFURANTOIN <=16 SENSITIVE Sensitive     TRIMETH/SULFA <=20 SENSITIVE Sensitive     AMPICILLIN/SULBACTAM 4 SENSITIVE Sensitive     PIP/TAZO <=4 SENSITIVE Sensitive     Extended ESBL NEGATIVE Sensitive     * >=100,000 COLONIES/mL ESCHERICHIA COLI  Surgical pcr screen     Status: None   Collection Time: 02/03/17 10:32 AM  Result Value Ref Range Status   MRSA, PCR NEGATIVE NEGATIVE Final   Staphylococcus aureus NEGATIVE NEGATIVE Final    Comment:        The Xpert SA Assay (FDA approved for NASAL specimens in patients over 43 years of age), is one component of a comprehensive surveillance program.  Test performance has been validated by [facility] for patients greater than or equal to 42 year old. It is not intended to diagnose infection nor to guide or monitor treatment.          Radiology Studies: Dg Cholangiogram Operative  Result Date: 02/03/2017 CLINICAL DATA:  71 year old female with history of prior ERCP and choledocholithiasis EXAM: INTRAOPERATIVE CHOLANGIOGRAM TECHNIQUE: Cholangiographic images from the C-arm fluoroscopic  device were submitted for interpretation post-operatively. Please see the procedural report for the amount of contrast and the fluoroscopy time utilized. COMPARISON:  ERCP 02/01/2017, CT 01/30/2017 FINDINGS: Surgical instruments project over the upper abdomen. There is cannulation of the cystic duct/gallbladder neck, with antegrade infusion of contrast. Caliber of the extrahepatic ductal system dilated Filling defects within the common bile duct. Contrast does not cross the ampulla on the current study. IMPRESSION: Intraoperative cholangiogram demonstrates extrahepatic biliary ducts mildly dilated with filling defects in the common bile duct compatible with stones/debris. Note that contrast does not traverse the ampulla on the current study and persisting obstruction not excluded. Please refer to the dictated operative report for full details of intraoperative findings and procedure Signed, Yvone Neu. Loreta Ave, DO [facility] Radiology Electronically Signed   By: Gilmer Mor D.O.   On: 02/03/2017 16:04    Scheduled Meds: . ciprofloxacin  400 mg Intravenous Q12H  . enoxaparin (LOVENOX) injection  40 mg Subcutaneous Q24H  . folic acid  1 mg Oral Daily  . gabapentin  600 mg Oral TID  . multivitamin with minerals  1 tablet Oral Daily  . nicotine  21 mg Transdermal Daily  . thiamine  100 mg Oral Daily   Or  . thiamine  100 mg Intravenous Daily   Continuous Infusions: . dextrose 5 % and 0.9 % NaCl with KCl 40 mEq/L 100 mL/hr at 02/04/17 0943     LOS: 5 days   Hazeline Junker, MD [facility] Pager [phone]  If 7PM-7AM, please contact night-coverage [email or web address] Password [facility] 02/04/2017, 9:57 AM

## 2017-02-04 NOTE — Progress Notes (Signed)
1 Day Post-Op  Subjective: Lying in bed with multiple covers, on O2, not taking much PO, says she wants to go home.    Objective: Vital signs in last 24 hours: Temp:  [97.6 F (36.4 C)-99 F (37.2 C)] 98 F (36.7 C) (02/22 0630) Pulse Rate:  [80-102] 102 (02/22 0630) Resp:  [11-16] 16 (02/22 0630) BP: (91-125)/(59-86) 118/65 (02/22 0630) SpO2:  [94 %-100 %] 94 % (02/22 0630) Last BM Date: 02/02/17 120 PO 3000 IV Urine:  203 recorded Afebrile, VSS Labs OK  Intake/Output from previous day: 02/21 0701 - 02/22 0700 In: 3263.3 [P.O.:120; I.V.:2273.3; Blood:670; IV Piggyback:200] Out: 228 [Urine:203; Blood:25] Intake/Output this shift: No intake/output data recorded.  General appearance: alert, cooperative and no distress GI: soft, sore, a little distended, BS hypoacitve.  Port sites look fine.  Lab Results:   Recent Labs  02/03/17 0621 02/04/17 0541  WBC 4.8 7.6  HGB 7.7* 10.2*  HCT 23.3* 31.4*  PLT 208 268    BMET  Recent Labs  02/03/17 0621 02/04/17 0541  NA 138 138  K 3.2* 4.3  CL 106 108  CO2 29 25  GLUCOSE 171* 221*  BUN <5* <5*  CREATININE 0.79 0.95  CALCIUM 8.2* 8.1*   PT/INR No results for input(s): LABPROT, INR in the last 72 hours.   Recent Labs Lab 01/29/17 2101 01/30/17 0550 02/01/17 0552 02/02/17 0004 02/04/17 0541  AST 85* 50* 46* 163* 202*  ALT 30 21 18 31  64*  ALKPHOS 103 81 77 88 159*  BILITOT 3.1* 1.4* 0.4 0.8 1.0  PROT 7.2 5.5* 5.1* 5.0* 5.3*  ALBUMIN 4.4 3.2* 2.8* 2.6* 2.7*     Lipase     Component Value Date/Time   LIPASE 164 (H) 01/31/2017 0533     Studies/Results: Dg Cholangiogram Operative  Result Date: 02/03/2017 CLINICAL DATA:  71 year old female with history of prior ERCP and choledocholithiasis EXAM: INTRAOPERATIVE CHOLANGIOGRAM TECHNIQUE: Cholangiographic images from the C-arm fluoroscopic device were submitted for interpretation post-operatively. Please see the procedural report for the amount of contrast  and the fluoroscopy time utilized. COMPARISON:  ERCP 02/01/2017, CT 01/30/2017 FINDINGS: Surgical instruments project over the upper abdomen. There is cannulation of the cystic duct/gallbladder neck, with antegrade infusion of contrast. Caliber of the extrahepatic ductal system dilated Filling defects within the common bile duct. Contrast does not cross the ampulla on the current study. IMPRESSION: Intraoperative cholangiogram demonstrates extrahepatic biliary ducts mildly dilated with filling defects in the common bile duct compatible with stones/debris. Note that contrast does not traverse the ampulla on the current study and persisting obstruction not excluded. Please refer to the dictated operative report for full details of intraoperative findings and procedure Signed, Yvone NeuJaime S. Loreta AveWagner, DO [facility] Electronically Signed   By: Gilmer MorJaime  Wagner D.O.   On: 02/03/2017 16:04    Medications: . ciprofloxacin  400 mg Intravenous Q12H  . enoxaparin (LOVENOX) injection  40 mg Subcutaneous Q24H  . folic acid  1 mg Oral Daily  . gabapentin  600 mg Oral TID  . multivitamin with minerals  1 tablet Oral Daily  . nicotine  21 mg Transdermal Daily  . thiamine  100 mg Oral Daily   Or  . thiamine  100 mg Intravenous Daily   . dextrose 5 % and 0.9 % NaCl with KCl 40 mEq/L 100 mL/hr at 02/04/17 0943   History of alcohol dependence/neuropathy  Ongoing alcohol use. Ongoing tobacco use Anemia Hypokalemia Hypertension Hyperlipidemia History of  shingles Depression 1 cm left adrenal adenoma   Assessment/Plan Gallstone pancreatitis Chronic cholecystitis, cholelithiasis, choledocholithiasis S/p LAPAROSCOPIC CHOLECYSTECTOMY with INTRAOP CHOLANGIOGRAM, Transcystic duct COMMON DUCT EXPLORATION with choledochoscope, 02/03/17, Dr. Ezzard Standing Status post ERCP/sphincterotomy with failure to remove obstruction 02/01/17, Dr. Dorena Cookey UTI FEN:  IV  fluids/clear ID: day 5 Cipro DVT:  Lovenox  Plan:  Advance to low fat diet, mobilize, stop antibiotics from our standpoint, she is on it for UTI also.    From our standpoint she can go home when she can eat soft diet, ambulate safely and void.   Defer discharge to Medicine when she is medically stable.  I will put follow up information in the AVS.    She can use Tylenol, NSAID or Vicodin for pain at home.    LOS: 5 days    JENNINGS,WILLARD 02/04/2017 [phone]  Agree with above. Daughter in room.  Still lethargic, but talking more.  Temp 101.4.  On Cipro. Needs to get out of bed - still not out of bed once today at 2:30 PM. Needs IS. To advance diet. She wants to go home, but needs to be doing more for herself first.  Appears to doing okay from surgery.  Ovidio Kin, MD, [facility] Pager: [phone] Office phone:  [phone]

## 2017-02-04 NOTE — Progress Notes (Signed)
GI: Patient sleep and I did not disturb. Discussed surgery results with Dr. Ezzard StandingNewman. Apparently successful in removing common bile duct stones. Appreciate his help. Please call if further GI input needed.

## 2017-02-05 DIAGNOSIS — E43 Unspecified severe protein-calorie malnutrition: Secondary | ICD-10-CM

## 2017-02-05 LAB — CBC WITH DIFFERENTIAL/PLATELET
BASOS ABS: 0 10*3/uL (ref 0.0–0.1)
Basophils Relative: 0 %
EOS ABS: 0 10*3/uL (ref 0.0–0.7)
Eosinophils Relative: 0 %
HEMATOCRIT: 26.3 % — AB (ref 36.0–46.0)
HEMOGLOBIN: 8.4 g/dL — AB (ref 12.0–15.0)
LYMPHS PCT: 8 %
Lymphs Abs: 0.8 10*3/uL (ref 0.7–4.0)
MCH: 33.7 pg (ref 26.0–34.0)
MCHC: 31.9 g/dL (ref 30.0–36.0)
MCV: 105.6 fL — ABNORMAL HIGH (ref 78.0–100.0)
MONOS PCT: 19 %
Monocytes Absolute: 2 10*3/uL — ABNORMAL HIGH (ref 0.1–1.0)
NEUTROS PCT: 73 %
Neutro Abs: 7.5 10*3/uL (ref 1.7–7.7)
Platelets: 283 10*3/uL (ref 150–400)
RBC: 2.49 MIL/uL — AB (ref 3.87–5.11)
RDW: 24.5 % — ABNORMAL HIGH (ref 11.5–15.5)
WBC: 10.3 10*3/uL (ref 4.0–10.5)

## 2017-02-05 LAB — COMPREHENSIVE METABOLIC PANEL
ALBUMIN: 2.3 g/dL — AB (ref 3.5–5.0)
ALK PHOS: 108 U/L (ref 38–126)
ALT: 39 U/L (ref 14–54)
ANION GAP: 6 (ref 5–15)
AST: 61 U/L — AB (ref 15–41)
BILIRUBIN TOTAL: 1 mg/dL (ref 0.3–1.2)
BUN: 8 mg/dL (ref 6–20)
CALCIUM: 7.7 mg/dL — AB (ref 8.9–10.3)
CO2: 27 mmol/L (ref 22–32)
Chloride: 105 mmol/L (ref 101–111)
Creatinine, Ser: 1.11 mg/dL — ABNORMAL HIGH (ref 0.44–1.00)
GFR calc Af Amer: 57 mL/min — ABNORMAL LOW (ref >60)
GFR calc non Af Amer: 49 mL/min — ABNORMAL LOW (ref >60)
GLUCOSE: 190 mg/dL — AB (ref 65–99)
Potassium: 3.6 mmol/L (ref 3.5–5.1)
SODIUM: 138 mmol/L (ref 135–145)
TOTAL PROTEIN: 4.6 g/dL — AB (ref 6.5–8.1)

## 2017-02-05 MED ORDER — UNJURY CHICKEN SOUP POWDER
2.0000 [oz_av] | Freq: Two times a day (BID) | ORAL | Status: DC
  Administered 2017-02-05: 2 [oz_av] via ORAL
  Filled 2017-02-05 (×3): qty 27

## 2017-02-05 MED ORDER — LORAZEPAM 1 MG PO TABS
1.0000 mg | ORAL_TABLET | Freq: Two times a day (BID) | ORAL | Status: DC | PRN
  Administered 2017-02-05 – 2017-02-06 (×2): 1 mg via ORAL
  Filled 2017-02-05 (×2): qty 1

## 2017-02-05 MED ORDER — CIPROFLOXACIN IN D5W 400 MG/200ML IV SOLN
400.0000 mg | Freq: Two times a day (BID) | INTRAVENOUS | Status: DC
  Administered 2017-02-05 – 2017-02-06 (×2): 400 mg via INTRAVENOUS
  Filled 2017-02-05 (×2): qty 200

## 2017-02-05 MED ORDER — GABAPENTIN 300 MG PO CAPS
600.0000 mg | ORAL_CAPSULE | Freq: Two times a day (BID) | ORAL | Status: DC
  Administered 2017-02-05 – 2017-02-06 (×3): 600 mg via ORAL
  Filled 2017-02-05 (×3): qty 2

## 2017-02-05 NOTE — Progress Notes (Signed)
PROGRESS NOTE    April [name]  ZOX:[id number] DOB: May 09, 1946 DOA: 01/29/2017 PCP: Lilia Argue   Brief Narrative:  71 yo female with past medical history of etho abuse, presents with abdominal pain for the last 5 days, epigastric and constant, associated with vomiting. On the initial physical examination, hemodynamically stable, with positive abdominal pain. Lipase 1,692. CT abdomen with pancreatitis with common bile duct dilatation with possible choledocholithiasis. Admitted for further treatment.  MRCP positive for choledocolithiasis, unsuccessful ERCP, so general surgery was consulted and performed laparoscopic cholecystectomy with removal of CBD stones into the duodenum. Preoperative anemia prompted 1u PRBCs transfusion with subsequent bump in hemoglobin to 10.2. The day after surgery she felt very weak, spiked a fever and had mild hypoxemia. CXR showed atelectasis and symptoms resolved with incentive spirometry. LFT's normalized post-operatively, though she felt weak and had slow improvement in per oral intake.   Assessment & Plan:   Principal Problem:   Gallstone pancreatitis Active Problems:   Alcohol abuse   Hypokalemia   Macrocytic anemia   Metabolic acidosis   Protein-calorie malnutrition, severe  Choledocholithiasis with acute gallstone pancreatitis s/p laparoscopic cholecystectomy w/removal of CBD stones into duodenum by Dr. Ezzard Standing 2/21: LFTs and bilirubin trending downward.  - Advancing diet this evening. If able to tolerate, will DC in AM - Pain controlled with as needed IV morphine - ContinueIV fluids and IV antiemetics.   Anemia: Acute on chronic, macrocytic due to alcohol abuse.  - Robust response to 1u PRBCs transfused (hgb 7.7 > 10.2).  - Give Vitamin B12 po, on 2/17 level was low normal at 294. Giving folate.  Hypokalemia and hypomagnesemia: Currently resolved. - Continue to monitor  EtOH dependence:  - No signs of active withdrawal, will continue  with as needed lorazepam - Thiamine and folate    Tobacco abuse: Scant wheezing and long term 1ppd smoking, ?COPD. - Provided smoking cessation - Continue with 21 mg nicotine patch.   Post-operative atelectasis: Has episode of fever and respiratory distress with subsequent CXR showing atelectasis and no definite infiltrate.  - Has been getting oxygen by nasal cannula for comfort, though has not had significant hypoxemia. Will wean today - IS, OOB, PT  E. coli UTI:  - Continue cipro per sensitivity data, will complete course 2/24. PCN allergy noted.   DVT prophylaxis:Enoxaparin  Code Status:DNR  Family Communication:None at bedside this AM Disposition Plan:Home in AM pending PT eval and diet tolerance Advancing diet.  Consultants: - GI - Surgery  Procedures: ERCP 02/01/2017  Antimicrobials:  - Ciprofloxacin 2/18 - 2/24  Subjective: Feeling stronger, will get up with PT today. Tolerating liquid diet. No dyspnea.    Objective: Vitals:   02/05/17 0551 02/05/17 0848 02/05/17 1227 02/05/17 1410  BP: 119/68 127/83  (!) 144/85  Pulse: 98 92  88  Resp: 16 14  18   Temp: 99.2 F (37.3 C) 98.4 F (36.9 C)  98.4 F (36.9 C)  TempSrc: Oral Oral  Oral  SpO2: 100% 97%  100%  Weight:   61 kg (134 lb 7.7 oz)   Height:        Intake/Output Summary (Last 24 hours) at 02/05/17 1559 Last data filed at 02/05/17 0400  Gross per 24 hour  Intake           918.75 ml  Output                0 ml  Net           918.75  ml   Filed Weights   01/30/17 0311 02/01/17 1144 02/05/17 1227  Weight: 55.7 kg (122 lb 12.7 oz) 55.3 kg (122 lb) 61 kg (134 lb 7.7 oz)    Examination:  General exam: Frail-appearing 70yo F in no distress E ENT: Mild pallor, no icterus.  Respiratory system: Nonlabored on room air, no end-expiratory wheezing. Base aeration improved from yesterday. Only pulling 750cc w/IS.   Cardiovascular system: S1 & S2 heard, RRR. No JVD, murmurs, rubs, gallops or clicks.  No pedal edema. Gastrointestinal system: Abdomen is nondistended, soft. +diffuse tenderness to palpation without rebound. No organomegaly or masses felt. Normal bowel sounds heard. Central nervous system: Alert and oriented. No focal neurological deficits. Extremities: Symmetric 5 x 5 power. Skin: No rashes, lesions or ulcers   Data Reviewed: I have personally reviewed following labs and imaging studies  CBC:  Recent Labs Lab 01/31/17 0533 02/01/17 0552 02/02/17 0553 02/03/17 0621 02/04/17 0541 02/05/17 0517  WBC 8.2 5.8 5.7 4.8 7.6 10.3  NEUTROABS 6.3 4.1 4.0 3.0  --  7.5  HGB 8.7* 8.0* 7.7* 7.7* 10.2* 8.4*  HCT 26.4* 23.8* 23.4* 23.3* 31.4* 26.3*  MCV 108.6* 106.7* 107.8* 108.4* 104.3* 105.6*  PLT 144* 138* 172 208 268 283   Basic Metabolic Panel:  Recent Labs Lab 01/30/17 0550  02/01/17 0552  02/02/17 0004 02/02/17 0553 02/03/17 0621 02/04/17 0541 02/05/17 0517  NA 143  < > 134*  < > 133* 136 138 138 138  K 2.6*  < > 2.3*  < > 3.2* 3.1* 3.2* 4.3 3.6  CL 113*  < > 100*  < > 101 104 106 108 105  CO2 22  < > 26  < > 27 26 29 25 27   GLUCOSE 94  < > 155*  < > 250* 183* 171* 221* 190*  BUN 13  < > <5*  < > <5* <5* <5* <5* 8  CREATININE 0.54  < > 0.34*  < > 0.71 0.79 0.79 0.95 1.11*  CALCIUM 8.6*  < > 8.0*  < > 7.8* 8.1* 8.2* 8.1* 7.7*  MG 1.5*  --  1.4*  --   --   --  1.3*  --   --   < > = values in this interval not displayed. GFR: Estimated Creatinine Clearance: 44.8 mL/min (by C-G formula based on SCr of 1.11 mg/dL (H)). Liver Function Tests:  Recent Labs Lab 01/30/17 0550 02/01/17 0552 02/02/17 0004 02/04/17 0541 02/05/17 0517  AST 50* 46* 163* 202* 61*  ALT 21 18 31  64* 39  ALKPHOS 81 77 88 159* 108  BILITOT 1.4* 0.4 0.8 1.0 1.0  PROT 5.5* 5.1* 5.0* 5.3* 4.6*  ALBUMIN 3.2* 2.8* 2.6* 2.7* 2.3*    Recent Labs Lab 01/29/17 2101 01/31/17 0533  LIPASE 1,692* 164*   No results for input(s): AMMONIA in the last 168 hours. Coagulation  Profile:  Recent Labs Lab 01/29/17 2358  INR 0.95   Cardiac Enzymes: No results for input(s): CKTOTAL, CKMB, CKMBINDEX, TROPONINI in the last 168 hours. BNP (last 3 results) No results for input(s): PROBNP in the last 8760 hours. HbA1C: No results for input(s): HGBA1C in the last 72 hours. CBG: No results for input(s): GLUCAP in the last 168 hours. Lipid Profile: No results for input(s): CHOL, HDL, LDLCALC, TRIG, CHOLHDL, LDLDIRECT in the last 72 hours. Thyroid Function Tests: No results for input(s): TSH, T4TOTAL, FREET4, T3FREE, THYROIDAB in the last 72 hours. Anemia Panel: No results for input(s): VITAMINB12, FOLATE, FERRITIN,  TIBC, IRON, RETICCTPCT in the last 72 hours. Sepsis Labs: No results for input(s): PROCALCITON, LATICACIDVEN in the last 168 hours.  Recent Results (from the past 240 hour(s))  Culture, Urine     Status: Abnormal   Collection Time: 01/31/17 12:45 PM  Result Value Ref Range Status   Specimen Description URINE, RANDOM  Final   Special Requests NONE  Final   Culture >=100,000 COLONIES/mL ESCHERICHIA COLI (A)  Final   Report Status 02/02/2017 FINAL  Final   Organism ID, Bacteria ESCHERICHIA COLI (A)  Final      Susceptibility   Escherichia coli - MIC*    AMPICILLIN 8 SENSITIVE Sensitive     CEFAZOLIN <=4 SENSITIVE Sensitive     CEFTRIAXONE <=1 SENSITIVE Sensitive     CIPROFLOXACIN <=0.25 SENSITIVE Sensitive     GENTAMICIN <=1 SENSITIVE Sensitive     IMIPENEM <=0.25 SENSITIVE Sensitive     NITROFURANTOIN <=16 SENSITIVE Sensitive     TRIMETH/SULFA <=20 SENSITIVE Sensitive     AMPICILLIN/SULBACTAM 4 SENSITIVE Sensitive     PIP/TAZO <=4 SENSITIVE Sensitive     Extended ESBL NEGATIVE Sensitive     * >=100,000 COLONIES/mL ESCHERICHIA COLI  Surgical pcr screen     Status: None   Collection Time: 02/03/17 10:32 AM  Result Value Ref Range Status   MRSA, PCR NEGATIVE NEGATIVE Final   Staphylococcus aureus NEGATIVE NEGATIVE Final    Comment:        The  Xpert SA Assay (FDA approved for NASAL specimens in patients over 221 years of age), is one component of a comprehensive surveillance program.  Test performance has been validated by [facility] for patients greater than or equal to 278 year old. It is not intended to diagnose infection nor to guide or monitor treatment.          Radiology Studies: Dg Chest 2 View  Result Date: 02/04/2017 CLINICAL DATA:  Post laparoscopic cholecystectomy yesterday, hypoxemia, postoperative fever, hypertension EXAM: CHEST  2 VIEW COMPARISON:  None FINDINGS: Upper normal heart size. Atherosclerotic calcification aorta. BILATERAL perihilar infiltrates and basilar pleural effusions. Probable components compressive atelectasis in the lower lobes. No pneumothorax. Bones unremarkable. IMPRESSION: Bibasilar pleural effusions and atelectasis with suspected mild perihilar infiltrates which could represent edema or infection. Electronically Signed   By: Ulyses SouthwardMark  Boles M.D.   On: 02/04/2017 18:01    Scheduled Meds: . ciprofloxacin  400 mg Intravenous Q12H  . enoxaparin (LOVENOX) injection  40 mg Subcutaneous Q24H  . folic acid  1 mg Oral Daily  . gabapentin  600 mg Oral BID  . multivitamin with minerals  1 tablet Oral Daily  . nicotine  21 mg Transdermal Daily  . protein supplement  2 oz Oral BID  . thiamine  100 mg Oral Daily   Or  . thiamine  100 mg Intravenous Daily  . vitamin B-12  1,000 mcg Oral Daily   Continuous Infusions: . dextrose 5 % and 0.45 % NaCl with KCl 20 mEq/L 75 mL/hr at 02/05/17 1320     LOS: 6 days   Hazeline Junkeryan Mylin Gignac, MD [facility] Pager [phone]  If 7PM-7AM, please contact night-coverage [email or web address] Password [email or web address] 02/05/2017, 3:59 PM

## 2017-02-05 NOTE — Progress Notes (Signed)
[facility] Office:  [phone] General Surgery Progress Note   LOS: 6 days  POD -  2 Days Post-Op  Assessment/Plan: 1.  LAPAROSCOPIC CHOLECYSTECTOMY WITH INTRAOP CHOLANGIOGRAM,  Trans cystic duct COMMON DUCT EXPLORATION - 02/03/2017 - D. [employer]  Labs okay.  PO intake a little better  2.  ERCP with sphincterotomy - 02/01/2017 - J. Madilyn FiremanHayes  Unable to clear CBD of stones 3.  Gall stone pancreatitis 4.  EtOH use  5.  Malnourished  Prealbumin - 8.0 - 02/03/2017 6.  Anemia   Hgb - 8.4 - 02/05/2017  7.  UTI - >100,000 colonies of E. Coli  On Cipro 8.  DVT prophylaxis - Lovenox 9.  Smokes - on nicoderm   Principal Problem:   Gallstone pancreatitis Active Problems:   Alcohol abuse   Hypokalemia   Macrocytic anemia   Metabolic acidosis   Protein-calorie malnutrition, severe  Subjective:  More alert today and looks better.  Plan to go home tomorrow. Daughter in room.  Objective:   Vitals:   02/05/17 0848 02/05/17 1410  BP: 127/83 (!) 144/85  Pulse: 92 88  Resp: 14 18  Temp: 98.4 F (36.9 C) 98.4 F (36.9 C)     Intake/Output from previous day:  02/22 0701 - 02/23 0700 In: 1038.8 [P.O.:120; I.V.:518.8; IV Piggyback:400] Out: -   Intake/Output this shift:  No intake/output data recorded.   Physical Exam:   General: Older WF who is alert and oriented.    HEENT: Normal. Pupils equal. .   Lungs: Clear   Abdomen: Soft.  Has BS.    Wound: Okay\   Lab Results:    Recent Labs  02/04/17 0541 02/05/17 0517  WBC 7.6 10.3  HGB 10.2* 8.4*  HCT 31.4* 26.3*  PLT 268 283    BMET   Recent Labs  02/04/17 0541 02/05/17 0517  NA 138 138  K 4.3 3.6  CL 108 105  CO2 25 27  GLUCOSE 221* 190*  BUN <5* 8  CREATININE 0.95 1.11*  CALCIUM 8.1* 7.7*    PT/INR  No results for input(s): LABPROT, INR in the last 72 hours.  ABG  No results for input(s): PHART, HCO3 in the last 72 hours.  Invalid input(s): PCO2, PO2   Studies/Results:  Dg Chest 2  View  Result Date: 02/04/2017 CLINICAL DATA:  Post laparoscopic cholecystectomy yesterday, hypoxemia, postoperative fever, hypertension EXAM: CHEST  2 VIEW COMPARISON:  None FINDINGS: Upper normal heart size. Atherosclerotic calcification aorta. BILATERAL perihilar infiltrates and basilar pleural effusions. Probable components compressive atelectasis in the lower lobes. No pneumothorax. Bones unremarkable. IMPRESSION: Bibasilar pleural effusions and atelectasis with suspected mild perihilar infiltrates which could represent edema or infection. Electronically Signed   By: Ulyses SouthwardMark  Boles M.D.   On: 02/04/2017 18:01     Anti-infectives:   Anti-infectives    Start     Dose/Rate Route Frequency Ordered Stop   02/05/17 2200  ciprofloxacin (CIPRO) IVPB 400 mg     400 mg 200 mL/hr over 60 Minutes Intravenous Every 12 hours 02/05/17 1612 02/07/17 0959   02/03/17 2200  ciprofloxacin (CIPRO) IVPB 400 mg  Status:  Discontinued     400 mg 200 mL/hr over 60 Minutes Intravenous Every 12 hours 02/03/17 1729 02/03/17 1752   02/01/17 1000  ciprofloxacin (CIPRO) IVPB 400 mg  Status:  Discontinued     400 mg 200 mL/hr over 60 Minutes Intravenous Every 12 hours 02/01/17 0908 02/05/17 1612   01/31/17 1000  ciprofloxacin (CIPRO) IVPB  200 mg  Status:  Discontinued     200 mg 100 mL/hr over 60 Minutes Intravenous Every 12 hours 01/31/17 0940 02/01/17 0908      Ovidio Kin, MD, FACS Pager: [phone] [facility] Surgery Office: [phone] 02/05/2017

## 2017-02-05 NOTE — Evaluation (Addendum)
Physical Therapy Evaluation Patient Details Name: April [name] MRN: [id number] DOB: 08-04-1946 Today's Date: 02/05/2017   History of Present Illness  70 yo female s/p LAPAROSCOPIC CHOLECYSTECTOMY ; PMHx: smoker, ETOH   Clinical Impression  Pt admitted with above diagnosis. Pt currently with functional limitations due to the deficits listed below (see PT Problem List).* Pt will benefit from skilled PT to increase their independence and safety with mobility to allow discharge to the venue listed below.  \ Pt requiring mod/max assist for transfers; very shaky during transfers...likely d/t ETOH hx; will continue to follow   pt dyspneic with tranfers, O2 sats 87-89%, O2 replaced at 2L  Follow Up Recommendations SNF;Home health PT (depending on progress/family able to assist)    Equipment Recommendations  Rolling walker with 5" wheels    Recommendations for Other Services       Precautions / Restrictions Precautions Precautions: Fall      Mobility  Bed Mobility Overal bed mobility: Needs Assistance Bed Mobility: Supine to Sit     Supine to sit: Min assist;Mod assist     General bed mobility comments: assist with trunk to upright  Transfers Overall transfer level: Needs assistance Equipment used: Rolling walker (2 wheeled) Transfers: Sit to/from [employer] Sit to Stand: Mod assist Stand pivot transfers: Mod assist       General transfer comment: posterior LOB, assist for balance, to maneuver RW and step by step cues throughout  Ambulation/Gait                Stairs            Wheelchair Mobility    Modified Rankin (Stroke Patients Only)       Balance Overall balance assessment: Needs assistance   Sitting balance-Leahy Scale: Fair Sitting balance - Comments: once assisted to midline     Standing balance-Leahy Scale: Zero Standing balance comment: RW walker support and assist from PT to maintain static stand                              Pertinent Vitals/Pain Pain Assessment: 0-10 Pain Score: 3  Pain Location: right side abd Pain Descriptors / Indicators: Sore Pain Intervention(s): Monitored during session;Limited activity within patient's tolerance    Home Living Family/patient expects to be discharged to:: Private residence Living Arrangements: Children;Other relatives Available Help at Discharge: Family;Available 24 hours/day Type of Home: House Home Access: Stairs to enter   [employer] of Steps: 1 Home Layout: One level Home Equipment: None      Prior Function Level of Independence: Independent               Hand Dominance        Extremity/Trunk Assessment   Upper Extremity Assessment Upper Extremity Assessment: Generalized weakness    Lower Extremity Assessment Lower Extremity Assessment: Generalized weakness       Communication   Communication: No difficulties  Cognition Arousal/Alertness: Awake/alert Behavior During Therapy: WFL for tasks assessed/performed Overall Cognitive Status: Impaired/Different from baseline Area of Impairment: Following commands;Safety/judgement;Problem solving       Following Commands: Follows one step commands with increased time;Follows multi-step commands inconsistently Safety/Judgement: Decreased awareness of deficits   Problem Solving: Slow processing;Difficulty sequencing;Requires verbal cues;Requires tactile cues      General Comments      Exercises     Assessment/Plan    PT Assessment Patient needs continued PT services  PT Problem List Decreased strength;Decreased  activity tolerance;Decreased balance;Decreased cognition;Decreased mobility       PT Treatment Interventions DME instruction;Gait training;Functional mobility training;Therapeutic exercise;Therapeutic activities;Patient/family education    PT Goals (Current goals can be found in the Care Plan section)  Acute Rehab PT Goals Patient Stated  Goal: none stated PT Goal Formulation: With patient Time For Goal Achievement: 02/19/17 Potential to Achieve Goals: Good    Frequency Min 3X/week   Barriers to discharge        Co-evaluation               End of Session Equipment Utilized During Treatment: Gait belt Activity Tolerance: Patient tolerated treatment well Patient left: in chair;with call bell/phone within reach;with chair alarm set   PT Visit Diagnosis: Unsteadiness on feet (R26.81)         Time: [id number] PT Time Calculation (min) (ACUTE ONLY): 16 min   Charges:   PT Evaluation $PT Eval Moderate Complexity: 1 Procedure     PT G Codes:         Annia Gomm 02/05/2017, 6:16 PM

## 2017-02-05 NOTE — Progress Notes (Signed)
[facility] Progress Note  Subjective: Feels better but still weak, tolerating clear liquid diet, not yet advanced beyond.  Objective: Vital signs in last 24 hours: Temp:  [98.2 F (36.8 C)-101.6 F (38.7 C)] 98.4 F (36.9 C) (02/23 0848) Pulse Rate:  [92-109] 92 (02/23 0848) Resp:  [14-16] 14 (02/23 0848) BP: (109-127)/(65-83) 127/83 (02/23 0848) SpO2:  [87 %-100 %] 97 % (02/23 0848) Weight change:    PE: Unchanged  Lab Results: Results for orders placed or performed during the hospital encounter of 01/29/17 (from the past 24 hour(s))  CBC with Differential/Platelet     Status: Abnormal   Collection Time: 02/05/17  5:17 AM  Result Value Ref Range   WBC 10.3 4.0 - 10.5 K/uL   RBC 2.49 (L) 3.87 - 5.11 MIL/uL   Hemoglobin 8.4 (L) 12.0 - 15.0 g/dL   HCT 16.126.3 (L) 09.636.0 - 04.546.0 %   MCV 105.6 (H) 78.0 - 100.0 fL   MCH 33.7 26.0 - 34.0 pg   MCHC 31.9 30.0 - 36.0 g/dL   RDW 40.924.5 (H) 81.111.5 - 91.415.5 %   Platelets 283 150 - 400 K/uL   Neutrophils Relative % 73 %   Lymphocytes Relative 8 %   Monocytes Relative 19 %   Eosinophils Relative 0 %   Basophils Relative 0 %   Neutro Abs 7.5 1.7 - 7.7 K/uL   Lymphs Abs 0.8 0.7 - 4.0 K/uL   Monocytes Absolute 2.0 (H) 0.1 - 1.0 K/uL   Eosinophils Absolute 0.0 0.0 - 0.7 K/uL   Basophils Absolute 0.0 0.0 - 0.1 K/uL   RBC Morphology POLYCHROMASIA PRESENT   Comprehensive metabolic panel     Status: Abnormal   Collection Time: 02/05/17  5:17 AM  Result Value Ref Range   Sodium 138 135 - 145 mmol/L   Potassium 3.6 3.5 - 5.1 mmol/L   Chloride 105 101 - 111 mmol/L   CO2 27 22 - 32 mmol/L   Glucose, Bld 190 (H) 65 - 99 mg/dL   BUN 8 6 - 20 mg/dL   Creatinine, Ser 7.821.11 (H) 0.44 - 1.00 mg/dL   Calcium 7.7 (L) 8.9 - 10.3 mg/dL   Total Protein 4.6 (L) 6.5 - 8.1 g/dL   Albumin 2.3 (L) 3.5 - 5.0 g/dL   AST 61 (H) 15 - 41 U/L   ALT 39 14 - 54 U/L   Alkaline Phosphatase 108 38 - 126 U/L   Total Bilirubin 1.0 0.3 - 1.2 mg/dL   GFR calc non  Af Amer 49 (L) >60 mL/min   GFR calc Af Amer 57 (L) >60 mL/min   Anion gap 6 5 - 15    Studies/Results: Dg Chest 2 View  Result Date: 02/04/2017 CLINICAL DATA:  Post laparoscopic cholecystectomy yesterday, hypoxemia, postoperative fever, hypertension EXAM: CHEST  2 VIEW COMPARISON:  None FINDINGS: Upper normal heart size. Atherosclerotic calcification aorta. BILATERAL perihilar infiltrates and basilar pleural effusions. Probable components compressive atelectasis in the lower lobes. No pneumothorax. Bones unremarkable. IMPRESSION: Bibasilar pleural effusions and atelectasis with suspected mild perihilar infiltrates which could represent edema or infection. Electronically Signed   By: Ulyses SouthwardMark  Boles M.D.   On: 02/04/2017 18:01   Dg Cholangiogram Operative  Result Date: 02/03/2017 CLINICAL DATA:  71 year old female with history of prior ERCP and choledocholithiasis EXAM: INTRAOPERATIVE CHOLANGIOGRAM TECHNIQUE: Cholangiographic images from the C-arm fluoroscopic device were submitted for interpretation post-operatively. Please see the procedural report for the amount of contrast and the fluoroscopy time utilized. COMPARISON:  ERCP  02/01/2017, CT 01/30/2017 FINDINGS: Surgical instruments project over the upper abdomen. There is cannulation of the cystic duct/gallbladder neck, with antegrade infusion of contrast. Caliber of the extrahepatic ductal system dilated Filling defects within the common bile duct. Contrast does not cross the ampulla on the current study. IMPRESSION: Intraoperative cholangiogram demonstrates extrahepatic biliary ducts mildly dilated with filling defects in the common bile duct compatible with stones/debris. Note that contrast does not traverse the ampulla on the current study and persisting obstruction not excluded. Please refer to the dictated operative report for full details of intraoperative findings and procedure Signed, Yvone Neu. Loreta Ave, DO Vascular and Interventional Radiology  [facility] Radiology Electronically Signed   By: Gilmer Mor D.O.   On: 02/03/2017 16:04      Assessment: Gallstones and common bile duct stones removed by Dr. Ezzard Standing, liver function tests normal  Plan: Advance diet as tolerated, we'll sign off for now.    Adrienne Delay C 02/05/2017, 12:14 PM  Pager [phone] If no answer or after 5 PM call [phone]

## 2017-02-05 NOTE — Progress Notes (Signed)
Initial Nutrition Assessment  DOCUMENTATION CODES:   Severe malnutrition in context of acute illness/injury  INTERVENTION:   Unjury chicken Soup BID, Each serving provides 100kcal and 21g protein   MVI  RD will order Ensure chocolate when diet advanced.   NUTRITION DIAGNOSIS:   Malnutrition related to acute illness, nausea, vomiting, poor appetite as evidenced by moderate depletions of muscle mass, moderate depletion of body fat, energy intake < or equal to 50% for > or equal to 5 days.  GOAL:   Patient will meet greater than or equal to 90% of their needs  MONITOR:   PO intake, Supplement acceptance, Weight trends, Labs  REASON FOR ASSESSMENT:   NPO/Clear Liquid Diet    ASSESSMENT:   71 yo female with past medical history of etho abuse, presents with abdominal pain for the last 5 days, epigastric and constant, associated with vomiting. Found to have chronic cholecystitis, cholelithiasis, choledocholithiasis, and gallstone pancreatitis. Now s/p LAPAROSCOPIC CHOLECYSTECTOMY with INTRAOP CHOLANGIOGRAM,   Met with pt in room today. Pt reports poor appetite and oral intake for 1 week pta. Pt has been NPO or on clear liquid diet since admit; > 5 days. Pt reports that she is eating 75% clear liquid diet currently. Pt reports N/V is resolved. RD discussed the importance of adequate protein intake for wound healing and preservation of lean muscle mass. Pt would like to have Unjury and then Ensure once diet advanced.   Medications reviewed and include: ciprofloxacin, lovenox, folic acid, MVI, nicotine, thiamine, vit B-12  Labs reviewed: creat 1.11(H), Ca 7.7(L) adj. 9.06 wnl, Alb 2.3(L), AST 61(H) Hgb 8.4(L), Hct 26.3(L) cbgs- 221, 190 x 24 hrs  Nutrition-Focused physical exam completed. Findings are moderate fat depletion, moderate muscle depletion, and no edema.   Diet Order:  Diet clear liquid Room service appropriate? Yes; Fluid consistency: Thin  Skin:  Wound (see comment)  (incision abdomen )  Last BM:  2/20  Height:   Ht Readings from Last 1 Encounters:  02/01/17 _0  (1.676 m)    Weight:   Wt Readings from Last 1 Encounters:  02/05/17 134 lb 7.7 oz (61 kg)    Ideal Body Weight:  59 kg  BMI:  Body mass index is 21.71 kg/m.  Estimated Nutritional Needs:   Kcal:  1500-1800kcal/day   Protein:  77-89g/day   Fluid:  >1.5L/day   EDUCATION NEEDS:   No education needs identified at this time  Koleen Distance, RD, LDN Pager #[phone]

## 2017-02-05 NOTE — Progress Notes (Signed)
Patient oxygen weaned to room air, with oxygen saturation 90 percent at lowest.  Left patient off oxygen.  Patient not having any dyspnea.

## 2017-02-06 LAB — CBC
HEMATOCRIT: 28.6 % — AB (ref 36.0–46.0)
Hemoglobin: 9.3 g/dL — ABNORMAL LOW (ref 12.0–15.0)
MCH: 33.7 pg (ref 26.0–34.0)
MCHC: 32.5 g/dL (ref 30.0–36.0)
MCV: 103.6 fL — AB (ref 78.0–100.0)
PLATELETS: 316 10*3/uL (ref 150–400)
RBC: 2.76 MIL/uL — ABNORMAL LOW (ref 3.87–5.11)
RDW: 22.5 % — AB (ref 11.5–15.5)
WBC: 8.8 10*3/uL (ref 4.0–10.5)

## 2017-02-06 LAB — COMPREHENSIVE METABOLIC PANEL
ALBUMIN: 2.5 g/dL — AB (ref 3.5–5.0)
ALT: 30 U/L (ref 14–54)
AST: 36 U/L (ref 15–41)
Alkaline Phosphatase: 112 U/L (ref 38–126)
Anion gap: 8 (ref 5–15)
BILIRUBIN TOTAL: 0.6 mg/dL (ref 0.3–1.2)
BUN: 9 mg/dL (ref 6–20)
CHLORIDE: 101 mmol/L (ref 101–111)
CO2: 28 mmol/L (ref 22–32)
CREATININE: 0.89 mg/dL (ref 0.44–1.00)
Calcium: 8.1 mg/dL — ABNORMAL LOW (ref 8.9–10.3)
GFR calc Af Amer: >60 mL/min (ref >60)
Glucose, Bld: 127 mg/dL — ABNORMAL HIGH (ref 65–99)
POTASSIUM: 2.8 mmol/L — AB (ref 3.5–5.1)
Sodium: 137 mmol/L (ref 135–145)
Total Protein: 5.1 g/dL — ABNORMAL LOW (ref 6.5–8.1)

## 2017-02-06 MED ORDER — POTASSIUM CHLORIDE CRYS ER 20 MEQ PO TBCR
40.0000 meq | EXTENDED_RELEASE_TABLET | Freq: Three times a day (TID) | ORAL | Status: AC
  Administered 2017-02-06 (×2): 40 meq via ORAL
  Filled 2017-02-06 (×2): qty 2

## 2017-02-06 MED ORDER — CYANOCOBALAMIN 1000 MCG PO TABS: 1000.0000 ug | ORAL_TABLET | Freq: Every day | ORAL | 0 refills | Status: DC

## 2017-02-06 NOTE — Care Management Note (Signed)
Case Management Note  Patient Details  Name: April PoissonDeborah S [name] MRN: [id number] Date of Birth: 12/06/1946  Subjective/Objective:      S/p Laparoscopic Cholecystectomy              Action/Plan: Discharge Planning: AVS reviewed:  NCM spoke to dtr, April [name]. Offered choice for HH/list provided. [facility] Contacted DME rep for RW and 3n1 bedside commode. [facility]   PCP April CampbellKAPLAN, KRISTEN W. MD  Expected Discharge Date:  02/06/17               Expected Discharge Plan:  Home w Home Health Services  In-House Referral:  NA  Discharge planning Services  CM Consult  Post Acute Care Choice:  Home Health Choice offered to:  Adult Children  DME Arranged:  3-N-1, Walker rolling DME Agency:  [facility]  HH Arranged:  PT HH Agency:  [facility]  Status of Service:  Completed, signed off  If discussed at Long Length of Stay Meetings, dates discussed:    Additional Comments:  April [name], April [name] Ellen, RN 02/06/2017, 6:01 PM

## 2017-02-06 NOTE — Discharge Summary (Signed)
Physician Discharge Summary  BRYNNLEE CUMPIAN ZOX:[id number] DOB: January 07, 1946 DOA: 01/29/2017  PCP: Lilia Argue  Admit date: 01/29/2017 Discharge date: 02/08/2017  Admitted From: Home Disposition: Home   Recommendations for Outpatient Follow-up:  1. Follow up with PCP in 1-2 weeks 2. Follow up with surgery in 2 weeks 3. Recommend CBC and BMP at follow up 4. Had post-op atelectasis without ongoing hypoxemia. Consider pulmonology referral/PFTs if remains dyspneic due to longstanding tobacco history.   Home Health: PT Equipment/Devices: 3n1, rolling walker Discharge Condition: Stable CODE STATUS: DNR Diet recommendation: High protein  Brief/Interim Summary: 71 yo female with past medical history of etho abuse, presents with abdominal pain for the last 5 days, epigastric and constant, associated with vomiting. On the initial physical examination, hemodynamically stable, with positive abdominal pain. Lipase 1,692. CT abdomen with pancreatitis with common bile duct dilatation with possible choledocholithiasis. Admitted for further treatment. MRCP positive for choledocolithiasis, unsuccessful ERCP, so general surgery was consulted and performed laparoscopic cholecystectomy with removal of CBD stones into the duodenum. Preoperative anemia prompted 1u PRBCs transfusion with subsequent bump in hemoglobin to 10.2. The day after surgery she felt very weak, spiked a fever and had mild hypoxemia. CXR showed atelectasis and symptoms resolved with incentive spirometry. She has no oxygen requirement. LFT's normalized post-operatively, though she felt weak and had slow improvement in per oral intake. She continues to be weak, and PT evaluation recommended SNF, though patient has refused and wishes to go home. This puts her at an increased risk for failure to thrive and subsequent readmission.   Discharge Diagnoses:  Principal Problem:   Gallstone pancreatitis Active Problems:   Alcohol abuse    Hypokalemia   Macrocytic anemia   Metabolic acidosis   Protein-calorie malnutrition, severe  Choledocholithiasis with acute gallstone pancreatitis s/p laparoscopic cholecystectomy w/removal of CBD stones into duodenum by Dr. Ezzard Standing 2/21: LFTs and bilirubin trending downward. Surgery signed off. - Follow up with Dr. Ezzard Standing, surgery, in 2 weeks.  - Continue ambulation.  - Pain controlled with tylenol  Anemia: Acute on chronic, macrocytic due to alcohol abuse.  - Robust response to 1u PRBCs transfused (hgb 7.7 > 10.2).  - Give Vitamin B12 po, on 2/17 level was low normal at 294. Giving folate.  Hypokalemia and hypomagnesemia: Acute drop after being resolved. Could contribute to weakness. Repleted and will require recheck at follow up.   EtOH dependence: This seems to be significant motivation for the patient to go home. - No signs of active withdrawal. - Thiamine and folate. - Giving lorazepam BID prn for anxiety.     Tobacco abuse: Scant wheezing and long term 1ppd smoking, ?COPD. - Provided smoking cessation counseling. Will need ongoing follow up.  - Continue with 21 mg nicotine patch.   Post-operative atelectasis: Has episode of fever and respiratory distress with subsequent CXR showing atelectasis and no definite infiltrate. No hypoxemia on room air. - IS, OOB, PT at home  E. coli UTI:  - Completed cipro course 2/24. PCN allergy noted.   Discharge Instructions Discharge Instructions    Discharge instructions    Complete by:  As directed    You were admitted for pancreatitis related to gallstones which has resolved since surgery. You are able to tolerate a diet and can therefore by discharged. It is strongly recommended that you regain your strength more before going home, but you have declined the option of short term rehabilitation. You can be discharged with the following recommendations:  - You will get home  health physical therapy after discharge. It is very important  for you to be active to regain your strength.  - Keep eating plenty of protein and staying hydrated. If you are unable to take anything by mouth, seek medical attention right away.  - You have completed the course of antibiotics for the UTI - Because your vitamin B12 level was low, you should take the supplement that has been sent to your pharmacy.  - You should stop drinking alcohol. - Follow up with the surgeon in 2 weeks.     Allergies as of 02/06/2017      Reactions   Naltrexone Shortness Of Breath   Penicillins Rash   Has patient had a PCN reaction causing immediate rash, facial/tongue/throat swelling, SOB or lightheadedness with hypotension: Yes immediate rash  Has patient had a PCN reaction causing severe rash involving mucus membranes or skin necrosis: Yes Has patient had a PCN reaction that required hospitalization": No Has patient had a PCN reaction occurring within the last 10 years: Yes If all of the above answers are "NO", then may proceed with Cephalosporin use.      Medication List    STOP taking these medications   potassium chloride SA 20 MEQ tablet Commonly known as:  K-DUR,KLOR-CON     TAKE these medications   amitriptyline 25 MG tablet Commonly known as:  ELAVIL Take 25 mg by mouth at bedtime as needed for sleep.   clonazePAM 0.5 MG tablet Commonly known as:  KLONOPIN Take 0.5 mg by mouth 2 (two) times daily as needed for anxiety.   cyanocobalamin 1000 MCG tablet Take 1 tablet (1,000 mcg total) by mouth daily.   gabapentin 300 MG capsule Commonly known as:  NEURONTIN Take 2 capsules (600 mg total) by mouth 3 (three) times daily.   GOODY HEADACHE PO Take 1 each by mouth daily as needed (headache).   Vitamin D (Ergocalciferol) 50000 units Caps capsule Commonly known as:  DRISDOL Take 50,000 Units by mouth once a week.      Follow-up Information    NEWMAN,DAVID H, MD Follow up.   Specialty:  General Surgery Why:  [facility]All for follow up in 2 weeks  after discharge.   Contact information: [address] [place] [address] [phone]        KAPLAN,KRISTEN, PA-C. Schedule an appointment as soon as possible for a visit in 1 week(s).   Specialty:  Family Medicine Contact information: [address] [place] [address] [phone]        [facility] Follow up.   Why:  Home Health Physical Therapy Contact information: [address] [place] [address] [phone]          Allergies  Allergen Reactions  . Naltrexone Shortness Of Breath  . Penicillins Rash    Has patient had a PCN reaction causing immediate rash, facial/tongue/throat swelling, SOB or lightheadedness with hypotension: Yes immediate rash  Has patient had a PCN reaction causing severe rash involving mucus membranes or skin necrosis: Yes Has patient had a PCN reaction that required hospitalization": No Has patient had a PCN reaction occurring within the last 10 years: Yes If all of the above answers are "NO", then may proceed with Cephalosporin use.     Consultations:  General surgery, Dr. Ezzard Standing  Procedures/Studies: Dg Chest 2 View  Result Date: 02/04/2017 CLINICAL DATA:  Post laparoscopic cholecystectomy yesterday, hypoxemia, postoperative fever, hypertension EXAM: CHEST  2 VIEW COMPARISON:  None FINDINGS: Upper  normal heart size. Atherosclerotic calcification aorta. BILATERAL perihilar infiltrates and basilar pleural effusions. Probable components compressive atelectasis in the lower lobes. No pneumothorax. Bones unremarkable. IMPRESSION: Bibasilar pleural effusions and atelectasis with suspected mild perihilar infiltrates which could represent edema or infection. Electronically Signed   By: Ulyses Southward M.D.   On: 02/04/2017 18:01   Dg Cholangiogram Operative  Result Date: 02/03/2017 CLINICAL DATA:  71 year old female with history of prior ERCP and choledocholithiasis EXAM: INTRAOPERATIVE CHOLANGIOGRAM  TECHNIQUE: Cholangiographic images from the C-arm fluoroscopic device were submitted for interpretation post-operatively. Please see the procedural report for the amount of contrast and the fluoroscopy time utilized. COMPARISON:  ERCP 02/01/2017, CT 01/30/2017 FINDINGS: Surgical instruments project over the upper abdomen. There is cannulation of the cystic duct/gallbladder neck, with antegrade infusion of contrast. Caliber of the extrahepatic ductal system dilated Filling defects within the common bile duct. Contrast does not cross the ampulla on the current study. IMPRESSION: Intraoperative cholangiogram demonstrates extrahepatic biliary ducts mildly dilated with filling defects in the common bile duct compatible with stones/debris. Note that contrast does not traverse the ampulla on the current study and persisting obstruction not excluded. Please refer to the dictated operative report for full details of intraoperative findings and procedure Signed, Yvone Neu. Loreta Ave, DO [facility] Electronically Signed   By: Gilmer Mor D.O.   On: 02/03/2017 16:04   Ct Head Wo Contrast  Result Date: 01/30/2017 CLINICAL DATA:  Initial evaluation for acute trauma, fall. EXAM: CT HEAD WITHOUT CONTRAST CT CERVICAL SPINE WITHOUT CONTRAST TECHNIQUE: Multidetector CT imaging of the head and cervical spine was performed following the standard protocol without intravenous contrast. Multiplanar CT image reconstructions of the cervical spine were also generated. COMPARISON:  Prior CT from 02/24/2016. FINDINGS: CT HEAD FINDINGS Brain: Generalized age-related cerebral atrophy with mild to moderate chronic microvascular ischemic disease. No acute intracranial hemorrhage. No evidence for acute large vessel territory infarct. No mass lesion, midline shift or mass effect. No hydrocephalus. No extra-axial fluid collection. Vascular: No hyperdense vessel. Scattered vascular  calcifications noted within the carotid siphons. Skull: Scalp soft tissues demonstrate no acute abnormality. Calvarium intact. Sinuses/Orbits: Globes and orbital soft tissues within normal limits. Small fluid level noted within the left maxillary sinus. Visualized paranasal sinuses are otherwise largely clear. No mastoid effusion. Degenerative changes noted about the temporomandibular joints bilaterally. CT CERVICAL SPINE FINDINGS Alignment: Straightening of the normal cervical lordosis. No listhesis. Skull base and vertebrae: The skullbase intact. Normal C1-2 articulations are preserved. Dens intact. Vertebral body heights are maintained. The bone no acute fracture. Soft tissues and spinal canal: The visualized soft tissues of the neck demonstrate no acute abnormality. No prevertebral edema. Prominent vascular calcifications present about the carotid bifurcations. Disc levels: Moderate degenerative spondylolysis present at C5-6 and C6-7. The the the prominent left-sided facet arthrosis at C3-4. Upper chest: Visualized upper mediastinum unremarkable. Emphysematous changes noted within the visualized lung apices. IMPRESSION: CT BRAIN: 1. No acute intracranial process identified. 2. Generalized age-related cerebral atrophy with mild chronic small vessel ischemic disease. 3. Small fluid level within the left maxillary sinus, suggesting acute sinusitis. CT CERVICAL SPINE: 1. No acute traumatic injury within the cervical spine. 2. Moderate degenerative spondylolysis at C5-6 and C6-7. 3. Emphysema. Electronically Signed   By: Rise Mu M.D.   On: 01/30/2017 00:52   Ct Cervical Spine Wo Contrast  Result Date: 01/30/2017 CLINICAL DATA:  Initial evaluation for acute trauma, fall. EXAM: CT HEAD WITHOUT CONTRAST CT CERVICAL SPINE WITHOUT  CONTRAST TECHNIQUE: Multidetector CT imaging of the head and cervical spine was performed following the standard protocol without intravenous contrast. Multiplanar CT image  reconstructions of the cervical spine were also generated. COMPARISON:  Prior CT from 02/24/2016. FINDINGS: CT HEAD FINDINGS Brain: Generalized age-related cerebral atrophy with mild to moderate chronic microvascular ischemic disease. No acute intracranial hemorrhage. No evidence for acute large vessel territory infarct. No mass lesion, midline shift or mass effect. No hydrocephalus. No extra-axial fluid collection. Vascular: No hyperdense vessel. Scattered vascular calcifications noted within the carotid siphons. Skull: Scalp soft tissues demonstrate no acute abnormality. Calvarium intact. Sinuses/Orbits: Globes and orbital soft tissues within normal limits. Small fluid level noted within the left maxillary sinus. Visualized paranasal sinuses are otherwise largely clear. No mastoid effusion. Degenerative changes noted about the temporomandibular joints bilaterally. CT CERVICAL SPINE FINDINGS Alignment: Straightening of the normal cervical lordosis. No listhesis. Skull base and vertebrae: The skullbase intact. Normal C1-2 articulations are preserved. Dens intact. Vertebral body heights are maintained. The bone no acute fracture. Soft tissues and spinal canal: The visualized soft tissues of the neck demonstrate no acute abnormality. No prevertebral edema. Prominent vascular calcifications present about the carotid bifurcations. Disc levels: Moderate degenerative spondylolysis present at C5-6 and C6-7. The the the prominent left-sided facet arthrosis at C3-4. Upper chest: Visualized upper mediastinum unremarkable. Emphysematous changes noted within the visualized lung apices. IMPRESSION: CT BRAIN: 1. No acute intracranial process identified. 2. Generalized age-related cerebral atrophy with mild chronic small vessel ischemic disease. 3. Small fluid level within the left maxillary sinus, suggesting acute sinusitis. CT CERVICAL SPINE: 1. No acute traumatic injury within the cervical spine. 2. Moderate degenerative  spondylolysis at C5-6 and C6-7. 3. Emphysema. Electronically Signed   By: Rise MuBenjamin  McClintock M.D.   On: 01/30/2017 00:52   Ct Abdomen Pelvis W Contrast  Result Date: 01/30/2017 CLINICAL DATA:  Initial evaluation for acute nausea, vomiting, diarrhea. EXAM: CT ABDOMEN AND PELVIS WITH CONTRAST TECHNIQUE: Multidetector CT imaging of the abdomen and pelvis was performed using the standard protocol following bolus administration of intravenous contrast. CONTRAST:  100mL ISOVUE-300 IOPAMIDOL (ISOVUE-300) INJECTION 61% COMPARISON:  None. FINDINGS: Lower chest: Scattered atelectatic changes present within the visualized lung bases. Visualized lungs are otherwise clear. Hepatobiliary: Diffuse hypoattenuation liver consistent with steatosis. Liver otherwise unremarkable. Gallbladder largely contracted. Calcification within the gallbladder with likely reflects a stone. There is mild mucosal enhancement about the gallbladder itself. Common bile duct is dilated up to 14 mm. There is question of possible intraluminal density within the distal common bile duct (series 2, image 34), which may reflect choledocholithiasis. Pancreas: Hazy inflammatory stranding about the pancreas, most prevalent about the pancreatic head and uncinate process, with additional stranding near the pancreatic tail. Findings suggestive of possible acute pancreatitis. This may be related to gallstones given the findings within the gallbladder and common bile duct. No loculated fluid collection. No findings to suggest pancreatic necrosis. Spleen: Spleen within normal limits. Adrenals/Urinary Tract: 11 mm nodule at the medial limb of the left adrenal gland, indeterminate. Adrenal glands otherwise unremarkable. Kidneys equal size with symmetric enhancement. No nephrolithiasis, hydronephrosis, or focal enhancing renal mass. No hydroureter. Bladder within normal limits. Stomach/Bowel: Stomach within normal limits. Mild hazy stranding about the duodenum, felt  to be secondary due to the inflammatory changes about the adjacent pancreas. No evidence for bowel obstruction. Colonic diverticulosis without evidence for acute diverticulitis. Intraluminal fluid density with mild mucosal enhancement noted about the ascending colon, suspected to be secondary in nature due to  the inflammatory changes within the upper abdomen. No other significant inflammatory changes about the bowels. No findings to suggest acute appendicitis. Vascular/Lymphatic: Advanced aorto bi-iliac atherosclerotic disease. No aneurysm. No adenopathy. Reproductive: Probable small fibroid noted within the uterus. Uterus otherwise unremarkable. Ovaries within normal limits. Other: No free intraperitoneal air. Small volume free fluid within the upper abdomen. Musculoskeletal: No acute osseous abnormality. No worrisome lytic or blastic osseous lesions. IMPRESSION: 1. Hazy inflammatory stranding with free fluid about the pancreas, suggesting acute pancreatitis. Correlation with serum lipase recommended. 2. Cholelithiasis with dilatation of the common bile duct up to 14 mm. Question intraluminal fluid density within the distal common bile duct, raising the possibility for possible choledocholithiasis. This is suspected to reflect the underlying etiology for acute pancreatitis. Gallbladder itself also appears somewhat inflamed with associated mucosal enhancement. This could be further assessed with dedicated right upper quadrant ultrasound. 3. Colonic diverticulosis without evidence for acute diverticulitis. 4. Fibroid uterus. 5. Severe hepatic steatosis. 6. Advanced aorto bi-iliac atherosclerotic disease. 7. 11 mm left adrenal nodule, indeterminate. Further evaluation with dedicated adrenal mass protocol CT and/or MRI suggested. This could be performed on a nonemergent basis. Electronically Signed   By: Rise Mu M.D.   On: 01/30/2017 01:10   Mr 3d Recon At Scanner  Result Date: 01/31/2017 CLINICAL  DATA:  71 year old female with past medical history of alcohol abuse presenting with upper abdominal pain for the past 5 days. Possible choledocholithiasis noted on recent CT the abdomen and pelvis which also demonstrated probable pancreatitis. Followup evaluation. EXAM: MRI ABDOMEN WITHOUT AND WITH CONTRAST (INCLUDING MRCP) TECHNIQUE: Multiplanar multisequence MR imaging of the abdomen was performed both before and after the administration of intravenous contrast. Heavily T2-weighted images of the biliary and pancreatic ducts were obtained, and three-dimensional MRCP images were rendered by post processing. CONTRAST:  11mL MULTIHANCE GADOBENATE DIMEGLUMINE 529 MG/ML IV SOLN COMPARISON:  No prior abdominal MRI. CT the abdomen and pelvis 01/30/2017. FINDINGS: Comment: Study is significantly limited by a large amount of patient respiratory motion. Lower chest: Small bilateral pleural effusions lying dependently. Hepatobiliary: Diffuse loss of signal intensity throughout the hepatic parenchyma on out of phase dual echo images, compatible with hepatic steatosis. No discrete cystic or solid hepatic lesions are identified. MRCP images demonstrate mild intra and extrahepatic biliary ductal dilatation. There are several small filling defects within the gallbladder, compatible with gallstones. No findings to suggest acute cholecystitis at this time. Common bile duct measures up to 12 mm in the porta hepatis, and there are several small filling defects within the distal aspect of the common bile duct, compatible with choledocholithiasis. The largest of these measure up to 6 mm in diameter (image 15 of series 5). Pancreas: No pancreatic mass. No pancreatic ductal dilatation on MRCP images. Small amount of peripancreatic fluid most evident adjacent to the distal body and tail of the pancreas. Spleen:  Unremarkable. Adrenals/Urinary Tract: 1 cm nodule in the medial limb of the left adrenal gland demonstrates loss of signal  intensity on out of phase dual echo images, compatible with an adenoma. Right adrenal gland and bilateral kidneys are normal in appearance. No hydroureteronephrosis in the visualized portions of the abdomen. Stomach/Bowel: Visualized portions are unremarkable. Vascular/Lymphatic: No aneurysm identified in the abdominal vasculature. No lymphadenopathy noted in the abdomen. Other: Trace amount of fluid adjacent to the distal body and tail of the pancreas. Trace amount of perihepatic ascites. Musculoskeletal: No aggressive osseous lesions are identified in the visualized portions of the skeleton. IMPRESSION: 1. Study  is positive for choledocholithiasis with mild intra and extrahepatic biliary ductal dilatation, indicative of obstruction. 2. Cholelithiasis without evidence of acute cholecystitis at this time. 3. Inflammatory changes adjacent to the distal body and tail of the pancreas, suggestive of an acute pancreatitis. 4. Small 1 cm left adrenal adenoma incidentally noted. 5. Trace volume of ascites. Electronically Signed   By: Trudie Reed M.D.   On: 01/31/2017 16:58   Dg Ercp  Result Date: 02/01/2017 CLINICAL DATA:  Choledocholithiasis with biliary dilatation but MRCP. EXAM: ERCP TECHNIQUE: Multiple spot images obtained with the fluoroscopic device and submitted for interpretation post-procedure. FLUOROSCOPY TIME:  Fluoroscopy Time:  4.2 minutes COMPARISON:  01/31/2017 FINDINGS: Limited retrograde cholangiogram demonstrates mild diffuse biliary dilatation. Distal aspect of the CBD is not well opacified. Distal CBD stricture or obstruction not excluded. Please refer to the ERCP report. IMPRESSION: Limited intraoperative cholangiogram demonstrates mild biliary dilatation diffusely. Limited assessment of the distal CBD. Difficult to exclude obstruction, stricture, or choledocholithiasis. These images were submitted for radiologic interpretation only. Please see the procedural report for the amount of contrast  and the fluoroscopy time utilized. Electronically Signed   By: Judie Petit.  Shick M.D.   On: 02/01/2017 16:32   Mr Abdomen Mrcp Vivien Rossetti Contast  Result Date: 01/31/2017 CLINICAL DATA:  71 year old female with past medical history of alcohol abuse presenting with upper abdominal pain for the past 5 days. Possible choledocholithiasis noted on recent CT the abdomen and pelvis which also demonstrated probable pancreatitis. Followup evaluation. EXAM: MRI ABDOMEN WITHOUT AND WITH CONTRAST (INCLUDING MRCP) TECHNIQUE: Multiplanar multisequence MR imaging of the abdomen was performed both before and after the administration of intravenous contrast. Heavily T2-weighted images of the biliary and pancreatic ducts were obtained, and three-dimensional MRCP images were rendered by post processing. CONTRAST:  11mL MULTIHANCE GADOBENATE DIMEGLUMINE 529 MG/ML IV SOLN COMPARISON:  No prior abdominal MRI. CT the abdomen and pelvis 01/30/2017. FINDINGS: Comment: Study is significantly limited by a large amount of patient respiratory motion. Lower chest: Small bilateral pleural effusions lying dependently. Hepatobiliary: Diffuse loss of signal intensity throughout the hepatic parenchyma on out of phase dual echo images, compatible with hepatic steatosis. No discrete cystic or solid hepatic lesions are identified. MRCP images demonstrate mild intra and extrahepatic biliary ductal dilatation. There are several small filling defects within the gallbladder, compatible with gallstones. No findings to suggest acute cholecystitis at this time. Common bile duct measures up to 12 mm in the porta hepatis, and there are several small filling defects within the distal aspect of the common bile duct, compatible with choledocholithiasis. The largest of these measure up to 6 mm in diameter (image 15 of series 5). Pancreas: No pancreatic mass. No pancreatic ductal dilatation on MRCP images. Small amount of peripancreatic fluid most evident adjacent to the  distal body and tail of the pancreas. Spleen:  Unremarkable. Adrenals/Urinary Tract: 1 cm nodule in the medial limb of the left adrenal gland demonstrates loss of signal intensity on out of phase dual echo images, compatible with an adenoma. Right adrenal gland and bilateral kidneys are normal in appearance. No hydroureteronephrosis in the visualized portions of the abdomen. Stomach/Bowel: Visualized portions are unremarkable. Vascular/Lymphatic: No aneurysm identified in the abdominal vasculature. No lymphadenopathy noted in the abdomen. Other: Trace amount of fluid adjacent to the distal body and tail of the pancreas. Trace amount of perihepatic ascites. Musculoskeletal: No aggressive osseous lesions are identified in the visualized portions of the skeleton. IMPRESSION: 1. Study is positive for choledocholithiasis with mild  intra and extrahepatic biliary ductal dilatation, indicative of obstruction. 2. Cholelithiasis without evidence of acute cholecystitis at this time. 3. Inflammatory changes adjacent to the distal body and tail of the pancreas, suggestive of an acute pancreatitis. 4. Small 1 cm left adrenal adenoma incidentally noted. 5. Trace volume of ascites. Electronically Signed   By: Trudie Reed M.D.   On: 01/31/2017 16:58   US Abdomen Limited Ruq  Result Date: 01/30/2017 CLINICAL DATA:  Cholelithiasis, abnormal CT, pain, vomiting EXAM: US ABDOMEN LIMITED - RIGHT UPPER QUADRANT COMPARISON:  CT abdomen/pelvis 01/30/2017 FINDINGS: Gallbladder: Shadowing gallstones in gallbladder up to 10 mm diameter. Gallbladder contracted. Wall suboptimally imaged. Mild tenderness in the region of the gallbladder with imaging. No pericholecystic fluid. Common bile duct: Diameter: Dilated 8 mm diameter at the porta hepatis, dilating to 14 mm more distally. Distal CBD is not imaged ; the questioned distal common duct stones identified on CT are unable to be assessed sonographically due to bowel gas. Liver: Echogenic,  secondary to fatty infiltration as seen on prior CT. Hepatopetal portal venous flow. No definite hepatic mass. No RIGHT upper quadrant free fluid. IMPRESSION: Contracted gallbladder with multiple gallstones. Fatty infiltration of liver. Extrahepatic biliary dilatation, though the distal CBD and potential choledocholithiasis identified on CT are obscured by bowel gas ; if clinically indicated these could be better assessed by MRCP imaging. Electronically Signed   By: Ulyses Southward M.D.   On: 01/30/2017 13:55  ERCP 02/01/2017 Laparoscopic cholecystectomy 02/03/2017 by Dr. Ezzard Standing.   Subjective: Pt feels slightly stronger, though her daughter believes she should go to SNF for rehab prior to coming home. She is tolerating po and denies fever, cough, or dyspnea at rest. No chest pain or leg swelling or dysuria.   Discharge Exam: Vitals:   02/06/17 1347 02/06/17 1732  BP: (!) 91/50 117/74  Pulse: 92 (!) 110  Resp: 18   Temp: 98.8 F (37.1 C)    General: Frail-appearing 70yo F in no distress Cardiovascular: RRR, S1/S2 +, no rubs, no gallops Respiratory: Nonlabored on room air, bibasilar crackles resolve with encouraging greater inspiratory effort, otherwise clear.  Abdominal: Soft, appropriately tender diffusely. +BS, nondistended. Laparoscopic incision sites c/d/i Extremities: No edema, no cyanosis  The results of significant diagnostics from this hospitalization (including imaging, microbiology, ancillary and laboratory) are listed below for reference.    Labs: Basic Metabolic Panel:  Recent Labs Lab 02/02/17 0553 02/03/17 0621 02/04/17 0541 02/05/17 0517 02/06/17 0533  NA 136 138 138 138 137  K 3.1* 3.2* 4.3 3.6 2.8*  CL 104 106 108 105 101  CO2 26 29 25 27 28   GLUCOSE 183* 171* 221* 190* 127*  BUN <5* <5* <5* 8 9  CREATININE 0.79 0.79 0.95 1.11* 0.89  CALCIUM 8.1* 8.2* 8.1* 7.7* 8.1*  MG  --  1.3*  --   --   --    Liver Function Tests:  Recent Labs Lab 02/02/17 0004  02/04/17 0541 02/05/17 0517 02/06/17 0533  AST 163* 202* 61* 36  ALT 31 64* 39 30  ALKPHOS 88 159* 108 112  BILITOT 0.8 1.0 1.0 0.6  PROT 5.0* 5.3* 4.6* 5.1*  ALBUMIN 2.6* 2.7* 2.3* 2.5*   CBC:  Recent Labs Lab 02/02/17 0553 02/03/17 0621 02/04/17 0541 02/05/17 0517 02/06/17 0533  WBC 5.7 4.8 7.6 10.3 8.8  NEUTROABS 4.0 3.0  --  7.5  --   HGB 7.7* 7.7* 10.2* 8.4* 9.3*  HCT 23.4* 23.3* 31.4* 26.3* 28.6*  MCV 107.8* 108.4* 104.3*  105.6* 103.6*  PLT 172 208 268 283 316   Urinalysis    Component Value Date/Time   COLORURINE AMBER (A) 01/29/2017 2111   APPEARANCEUR HAZY (A) 01/29/2017 2111   LABSPEC 1.017 01/29/2017 2111   PHURINE 6.0 01/29/2017 2111   GLUCOSEU NEGATIVE 01/29/2017 2111   HGBUR LARGE (A) 01/29/2017 2111   BILIRUBINUR NEGATIVE 01/29/2017 2111   KETONESUR 80 (A) 01/29/2017 2111   PROTEINUR 100 (A) 01/29/2017 2111   NITRITE POSITIVE (A) 01/29/2017 2111   LEUKOCYTESUR NEGATIVE 01/29/2017 2111    Microbiology Recent Results (from the past 240 hour(s))  Culture, Urine     Status: Abnormal   Collection Time: 01/31/17 12:45 PM  Result Value Ref Range Status   Specimen Description URINE, RANDOM  Final   Special Requests NONE  Final   Culture >=100,000 COLONIES/mL ESCHERICHIA COLI (A)  Final   Report Status 02/02/2017 FINAL  Final   Organism ID, Bacteria ESCHERICHIA COLI (A)  Final      Susceptibility   Escherichia coli - MIC*    AMPICILLIN 8 SENSITIVE Sensitive     CEFAZOLIN <=4 SENSITIVE Sensitive     CEFTRIAXONE <=1 SENSITIVE Sensitive     CIPROFLOXACIN <=0.25 SENSITIVE Sensitive     GENTAMICIN <=1 SENSITIVE Sensitive     IMIPENEM <=0.25 SENSITIVE Sensitive     NITROFURANTOIN <=16 SENSITIVE Sensitive     TRIMETH/SULFA <=20 SENSITIVE Sensitive     AMPICILLIN/SULBACTAM 4 SENSITIVE Sensitive     PIP/TAZO <=4 SENSITIVE Sensitive     Extended ESBL NEGATIVE Sensitive     * >=100,000 COLONIES/mL ESCHERICHIA COLI  Surgical pcr screen     Status: None    Collection Time: 02/03/17 10:32 AM  Result Value Ref Range Status   MRSA, PCR NEGATIVE NEGATIVE Final   Staphylococcus aureus NEGATIVE NEGATIVE Final    Comment:        The Xpert SA Assay (FDA approved for NASAL specimens in patients over 39 years of age), is one component of a comprehensive surveillance program.  Test performance has been validated by [facility] for patients greater than or equal to 76 year old. It is not intended to diagnose infection nor to guide or monitor treatment.     Time coordinating discharge: Approximately 40 minutes  Hazeline Junker, MD  [facility] 02/08/2017, 2:48 PM Pager [phone]

## 2017-02-06 NOTE — Final Consult Note (Signed)
Consultant Final Sign-Off Note    Assessment/Final recommendations  April [name] is a 71 y.o. female followed by the service for gallstone pancreatitis.  Discharge instructions are in the chart.  Ok to d/c from our standpoint.     Wound care (if applicable):    Diet at discharge: per primary team   Activity at discharge: no heavy lifting   Follow-up appointment:  Dr Ezzard StandingNewman in 2 weeks   Pending results:  Unresulted Labs    Start     Ordered   02/06/17 0500  Creatinine, serum  (enoxaparin (LOVENOX)    CrCl >/= 30 ml/min)  Weekly,   R    Comments:  while on enoxaparin therapy    01/30/17 0305       Medication recommendations:   Other recommendations: See discharge instructions   Thank you for allowing us to participate in the care of your patient!  Please consult us again if you have further needs for your patient.  Brionne Mertz C. 02/06/2017 11:34 AM    Subjective    Doing well, tolerating a diet Objective  Vital signs in last 24 hours: Temp:  [97.5 F (36.4 C)-98.4 F (36.9 C)] 97.6 F (36.4 C) (02/24 0511) Pulse Rate:  [85-92] 92 (02/24 0511) Resp:  [18] 18 (02/24 0511) BP: (109-162)/(58-85) 109/70 (02/24 0511) SpO2:  [90 %-100 %] 98 % (02/24 0511) Weight:  [61 kg (134 lb 7.7 oz)] 61 kg (134 lb 7.7 oz) (02/23 1227)  General:    Pertinent labs and Studies:  Recent Labs  02/04/17 0541 02/05/17 0517 02/06/17 0533  WBC 7.6 10.3 8.8  HGB 10.2* 8.4* 9.3*  HCT 31.4* 26.3* 28.6*   BMET  Recent Labs  02/05/17 0517 02/06/17 0533  NA 138 137  K 3.6 2.8*  CL 105 101  CO2 27 28  GLUCOSE 190* 127*  BUN 8 9  CREATININE 1.11* 0.89  CALCIUM 7.7* 8.1*   No results for input(s): LABURIN in the last 72 hours. Results for orders placed or performed during the hospital encounter of 01/29/17  Culture, Urine     Status: Abnormal   Collection Time: 01/31/17 12:45 PM  Result Value Ref Range Status   Specimen Description URINE, RANDOM  Final   Special Requests NONE  Final   Culture >=100,000 COLONIES/mL ESCHERICHIA COLI (A)  Final   Report Status 02/02/2017 FINAL  Final   Organism ID, Bacteria ESCHERICHIA COLI (A)  Final      Susceptibility   Escherichia coli - MIC*    AMPICILLIN 8 SENSITIVE Sensitive     CEFAZOLIN <=4 SENSITIVE Sensitive     CEFTRIAXONE <=1 SENSITIVE Sensitive     CIPROFLOXACIN <=0.25 SENSITIVE Sensitive     GENTAMICIN <=1 SENSITIVE Sensitive     IMIPENEM <=0.25 SENSITIVE Sensitive     NITROFURANTOIN <=16 SENSITIVE Sensitive     TRIMETH/SULFA <=20 SENSITIVE Sensitive     AMPICILLIN/SULBACTAM 4 SENSITIVE Sensitive     PIP/TAZO <=4 SENSITIVE Sensitive     Extended ESBL NEGATIVE Sensitive     * >=100,000 COLONIES/mL ESCHERICHIA COLI  Surgical pcr screen     Status: None   Collection Time: 02/03/17 10:32 AM  Result Value Ref Range Status   MRSA, PCR NEGATIVE NEGATIVE Final   Staphylococcus aureus NEGATIVE NEGATIVE Final    Comment:        The Xpert SA Assay (FDA approved for NASAL specimens in patients over 70 years of age), is one component of a comprehensive surveillance program.  Test performance has been validated by [facility] for patients greater than or equal to 104 year old. It is not intended to diagnose infection nor to guide or monitor treatment.     Imaging: No results found.

## 2017-02-06 NOTE — Progress Notes (Addendum)
PROGRESS NOTE    April [name]  EXB:[id number] DOB: [birth year] DOA: 01/29/2017 PCP: Lilia Argue   Brief Narrative:  71 yo female with past medical history of etho abuse, presents with abdominal pain for the last 5 days, epigastric and constant, associated with vomiting. On the initial physical examination, hemodynamically stable, with positive abdominal pain. Lipase 1,692. CT abdomen with pancreatitis with common bile duct dilatation with possible choledocholithiasis. Admitted for further treatment.  MRCP positive for choledocolithiasis, unsuccessful ERCP, so general surgery was consulted and performed laparoscopic cholecystectomy with removal of CBD stones into the duodenum. Preoperative anemia prompted 1u PRBCs transfusion with subsequent bump in hemoglobin to 10.2. The day after surgery she felt very weak, spiked a fever and had mild hypoxemia. CXR showed atelectasis and symptoms resolved with incentive spirometry. She has no oxygen requirement. LFT's normalized post-operatively, though she felt weak and had slow improvement in per oral intake. She continues to be weak, and PT evaluation recommended SNF, though patient is reluctant.   Assessment & Plan:   Principal Problem:   Gallstone pancreatitis Active Problems:   Alcohol abuse   Hypokalemia   Macrocytic anemia   Metabolic acidosis   Protein-calorie malnutrition, severe  Choledocholithiasis with acute gallstone pancreatitis s/p laparoscopic cholecystectomy w/removal of CBD stones into duodenum by Dr. Ezzard Standing 2/21: LFTs and bilirubin trending downward. Surgery signed off - Follow up with Dr. Ezzard Standing, surgery, in 2 weeks.  - Continue ambulation.  - Pain controlled with tylenol - Can stop IVF  Anemia: Acute on chronic, macrocytic due to alcohol abuse.  - Robust response to 1u PRBCs transfused (hgb 7.7 > 10.2).  - Give Vitamin B12 po, on 2/17 level was low normal at 294. Giving folate.  Hypokalemia and hypomagnesemia: Acute  drop after being resolved. Could contribute to weakness. - Replete and recheck in AM.   EtOH dependence: This seems to be significant motivation for the patient to go home. - No signs of active withdrawal. - Thiamine and folate. - Giving lorazepam BID prn for anxiety.     Tobacco abuse: Scant wheezing and long term 1ppd smoking, ?COPD. - Provided smoking cessation - Continue with 21 mg nicotine patch.   Post-operative atelectasis: Has episode of fever and respiratory distress with subsequent CXR showing atelectasis and no definite infiltrate.  - No hypoxemia. - IS, OOB, PT  E. coli UTI:  - Continue cipro per sensitivity data, will complete course 2/24. PCN allergy noted.   DVT prophylaxis:Enoxaparin  Code Status:DNR  Family Communication:Daughter at bedside Disposition Plan:I believe she would not thrive at home, so I've discussed in depth the option of SNF with the patient and daughter. The patient is amenable. SW has been consulted. Anticipate will be unable to place over weekend.  Consultants: - GI - Surgery  Procedures: ERCP 02/01/2017  Antimicrobials:  - Ciprofloxacin 2/18 - 2/24  Subjective: Feeling stronger but knows she's too weak to return home. Denies abd pain, eating scant amounts of regular diet.     Objective: Vitals:   02/05/17 1611 02/05/17 1900 02/06/17 0511 02/06/17 1347  BP:  (!) 162/58 109/70 (!) 91/50  Pulse:  85 92 92  Resp:  18 18 18   Temp:  97.5 F (36.4 C) 97.6 F (36.4 C) 98.8 F (37.1 C)  TempSrc:  Oral Oral Oral  SpO2: 90% 92% 98% 96%  Weight:      Height:        Intake/Output Summary (Last 24 hours) at 02/06/17 1430 Last data filed at 02/06/17  1145  Gross per 24 hour  Intake             1140 ml  Output                0 ml  Net             1140 ml   Filed Weights   01/30/17 0311 02/01/17 1144 02/05/17 1227  Weight: 55.7 kg (122 lb 12.7 oz) 55.3 kg (122 lb) 61 kg (134 lb 7.7 oz)    Examination:  General exam:  Frail-appearing 71yo F in no distress E ENT: Mild pallor, no icterus.  Respiratory system: Nonlabored on room air, no wheezing. Base aeration improved again from yesterday.   Cardiovascular system: S1 & S2 heard, RRR. No JVD, murmurs, rubs, gallops or clicks. No pedal edema. Gastrointestinal system: Abdomen is nondistended, soft. +diffuse mild tenderness to palpation without rebound. No organomegaly or masses felt. Normal bowel sounds heard. Central nervous system: Alert and oriented. No focal neurological deficits. Extremities: Symmetric 5 x 5 power. Skin: No rashes, lesions or ulcers   Data Reviewed: I have personally reviewed following labs and imaging studies  CBC:  Recent Labs Lab 01/31/17 0533 02/01/17 0552 02/02/17 0553 02/03/17 0621 02/04/17 0541 02/05/17 0517 02/06/17 0533  WBC 8.2 5.8 5.7 4.8 7.6 10.3 8.8  NEUTROABS 6.3 4.1 4.0 3.0  --  7.5  --   HGB 8.7* 8.0* 7.7* 7.7* 10.2* 8.4* 9.3*  HCT 26.4* 23.8* 23.4* 23.3* 31.4* 26.3* 28.6*  MCV 108.6* 106.7* 107.8* 108.4* 104.3* 105.6* 103.6*  PLT 144* 138* 172 208 268 283 316   Basic Metabolic Panel:  Recent Labs Lab 02/01/17 0552  02/02/17 0553 02/03/17 0621 02/04/17 0541 02/05/17 0517 02/06/17 0533  NA 134*  < > 136 138 138 138 137  K 2.3*  < > 3.1* 3.2* 4.3 3.6 2.8*  CL 100*  < > 104 106 108 105 101  CO2 26  < > 26 29 25 27 28   GLUCOSE 155*  < > 183* 171* 221* 190* 127*  BUN <5*  < > <5* <5* <5* 8 9  CREATININE 0.34*  < > 0.79 0.79 0.95 1.11* 0.89  CALCIUM 8.0*  < > 8.1* 8.2* 8.1* 7.7* 8.1*  MG 1.4*  --   --  1.3*  --   --   --   < > = values in this interval not displayed. GFR: Estimated Creatinine Clearance: 55.8 mL/min (by C-G formula based on SCr of 0.89 mg/dL). Liver Function Tests:  Recent Labs Lab 02/01/17 0552 02/02/17 0004 02/04/17 0541 02/05/17 0517 02/06/17 0533  AST 46* 163* 202* 61* 36  ALT 18 31 64* 39 30  ALKPHOS 77 88 159* 108 112  BILITOT 0.4 0.8 1.0 1.0 0.6  PROT 5.1* 5.0* 5.3*  4.6* 5.1*  ALBUMIN 2.8* 2.6* 2.7* 2.3* 2.5*    Recent Labs Lab 01/31/17 0533  LIPASE 164*   No results for input(s): AMMONIA in the last 168 hours. Coagulation Profile: No results for input(s): INR, PROTIME in the last 168 hours. Cardiac Enzymes: No results for input(s): CKTOTAL, CKMB, CKMBINDEX, TROPONINI in the last 168 hours. BNP (last 3 results) No results for input(s): PROBNP in the last 8760 hours. HbA1C: No results for input(s): HGBA1C in the last 72 hours. CBG: No results for input(s): GLUCAP in the last 168 hours. Lipid Profile: No results for input(s): CHOL, HDL, LDLCALC, TRIG, CHOLHDL, LDLDIRECT in the last 72 hours. Thyroid Function Tests: No results for  input(s): TSH, T4TOTAL, FREET4, T3FREE, THYROIDAB in the last 72 hours. Anemia Panel: No results for input(s): VITAMINB12, FOLATE, FERRITIN, TIBC, IRON, RETICCTPCT in the last 72 hours. Sepsis Labs: No results for input(s): PROCALCITON, LATICACIDVEN in the last 168 hours.  Recent Results (from the past 240 hour(s))  Culture, Urine     Status: Abnormal   Collection Time: 01/31/17 12:45 PM  Result Value Ref Range Status   Specimen Description URINE, RANDOM  Final   Special Requests NONE  Final   Culture >=100,000 COLONIES/mL ESCHERICHIA COLI (A)  Final   Report Status 02/02/2017 FINAL  Final   Organism ID, Bacteria ESCHERICHIA COLI (A)  Final      Susceptibility   Escherichia coli - MIC*    AMPICILLIN 8 SENSITIVE Sensitive     CEFAZOLIN <=4 SENSITIVE Sensitive     CEFTRIAXONE <=1 SENSITIVE Sensitive     CIPROFLOXACIN <=0.25 SENSITIVE Sensitive     GENTAMICIN <=1 SENSITIVE Sensitive     IMIPENEM <=0.25 SENSITIVE Sensitive     NITROFURANTOIN <=16 SENSITIVE Sensitive     TRIMETH/SULFA <=20 SENSITIVE Sensitive     AMPICILLIN/SULBACTAM 4 SENSITIVE Sensitive     PIP/TAZO <=4 SENSITIVE Sensitive     Extended ESBL NEGATIVE Sensitive     * >=100,000 COLONIES/mL ESCHERICHIA COLI  Surgical pcr screen     Status:  None   Collection Time: 02/03/17 10:32 AM  Result Value Ref Range Status   MRSA, PCR NEGATIVE NEGATIVE Final   Staphylococcus aureus NEGATIVE NEGATIVE Final    Comment:        The Xpert SA Assay (FDA approved for NASAL specimens in patients over 70 years of age), is one component of a comprehensive surveillance program.  Test performance has been validated by [facility] for patients greater than or equal to 734 year old. It is not intended to diagnose infection nor to guide or monitor treatment.      Radiology Studies: Dg Chest 2 View  Result Date: 02/04/2017 CLINICAL DATA:  Post laparoscopic cholecystectomy yesterday, hypoxemia, postoperative fever, hypertension EXAM: CHEST  2 VIEW COMPARISON:  None FINDINGS: Upper normal heart size. Atherosclerotic calcification aorta. BILATERAL perihilar infiltrates and basilar pleural effusions. Probable components compressive atelectasis in the lower lobes. No pneumothorax. Bones unremarkable. IMPRESSION: Bibasilar pleural effusions and atelectasis with suspected mild perihilar infiltrates which could represent edema or infection. Electronically Signed   By: Ulyses SouthwardMark  Boles M.D.   On: 02/04/2017 18:01    Scheduled Meds: . ciprofloxacin  400 mg Intravenous Q12H  . enoxaparin (LOVENOX) injection  40 mg Subcutaneous Q24H  . folic acid  1 mg Oral Daily  . gabapentin  600 mg Oral BID  . multivitamin with minerals  1 tablet Oral Daily  . nicotine  21 mg Transdermal Daily  . potassium chloride  40 mEq Oral Q8H  . protein supplement  2 oz Oral BID  . thiamine  100 mg Oral Daily   Or  . thiamine  100 mg Intravenous Daily  . vitamin B-12  1,000 mcg Oral Daily   Continuous Infusions:    LOS: 7 days   Hazeline Junkeryan Rocket Gunderson, MD [facility] Pager [phone]  If 7PM-7AM, please contact night-coverage [email or web address] Password [email or web address] 02/06/2017, 2:30 PM

## 2017-02-06 NOTE — Discharge Instructions (Signed)
[facility] ______[facility] °LAPAROSCOPIC SURGERY: POST OP INSTRUCTIONS °Always review your discharge instruction sheet given to you by the facility where your surgery was performed. °IF YOU HAVE DISABILITY OR FAMILY LEAVE FORMS, YOU MUST BRING THEM TO THE OFFICE FOR PROCESSING.   °DO NOT GIVE THEM TO YOUR DOCTOR. ° °1. A prescription for pain medication may be given to you upon discharge.  Take your pain medication as prescribed, if needed.  If narcotic pain medicine is not needed, then you may take acetaminophen (Tylenol) or ibuprofen (Advil) as needed. °2. Take your usually prescribed medications unless otherwise directed. °3. If you need a refill on your pain medication, please contact your pharmacy.  They will contact our office to request authorization. Prescriptions will not be filled after 5pm or on week-ends. °4. You should follow a light diet the first few days after arrival home, such as soup and crackers, etc.  Be sure to include lots of fluids daily. °5. Most patients will experience some swelling and bruising in the area of the incisions.  Ice packs will help.  Swelling and bruising can take several days to resolve.  °6. It is common to experience some constipation if taking pain medication after surgery.  Increasing fluid intake and taking a stool softener (such as Colace) will usually help or prevent this problem from occurring.  A mild laxative (Milk of Magnesia or Miralax) should be taken according to package instructions if there are no bowel movements after 48 hours. °7. Unless discharge instructions indicate otherwise, you may remove your bandages 24-48 hours after surgery, and you may shower at that time.  You may have steri-strips (small skin tapes) in place directly over the incision.  These strips should be left on the skin for 7-10 days.  If your surgeon used skin glue on the incision, you may shower in 24 hours.  The glue will flake off over the next 2-3 weeks.  Any sutures or  staples will be removed at the office during your follow-up visit. °8. ACTIVITIES:  You may resume regular (light) daily activities beginning the next day--such as daily self-care, walking, climbing stairs--gradually increasing activities as tolerated.  You may have sexual intercourse when it is comfortable.  Refrain from any heavy lifting or straining until approved by your doctor. °a. You may drive when you are no longer taking prescription pain medication, you can comfortably wear a seatbelt, and you can safely maneuver your car and apply brakes. °b. RETURN TO WORK:  __________________________________________________________ °9. You should see your doctor in the office for a follow-up appointment approximately 2-3 weeks after your surgery.  Make sure that you call for this appointment within a day or two after you arrive home to insure a convenient appointment time. °10. OTHER INSTRUCTIONS: __________________________________________________________________________________________________________________________ __________________________________________________________________________________________________________________________ °WHEN TO CALL YOUR DOCTOR: °1. Fever over 101.0 °2. Inability to urinate °3. Continued bleeding from incision. °4. Increased pain, redness, or drainage from the incision. °5. Increasing abdominal pain ° °The clinic staff is available to answer your questions during regular business hours.  Please don’t hesitate to call and ask to speak to one of the nurses for clinical concerns.  If you have a medical emergency, go to the nearest emergency room or call 911.  A surgeon from [facility] is always on call at the hospital. °[address], [address], [place], [place]  [address] ? [address] [address], [place],    [address] °[phone] ? [phone] ? FAX [phone] °Web site:   [email or web address]   Endoscopic Retrograde Cholangiopancreatography (ERCP),  Care After Refer to this sheet in the next few weeks. These instructions provide you with information on caring for yourself after your procedure. Your health care provider may also give you more specific instructions. Your treatment has been planned according to current medical practices, but problems sometimes occur. Call your health care provider if you have any problems or questions after your procedure.  WHAT TO EXPECT AFTER THE PROCEDURE  After your procedure, it is typical to feel:   Soreness in your throat.   Sick to your stomach (nauseous).   Bloated.  Dizzy.   Fatigued. HOME CARE INSTRUCTIONS  Have a friend or family member stay with you for the first 24 hours after your procedure.  Start taking your usual medicines and eating normally as soon as you feel well enough to do so or as directed by your health care provider. SEEK MEDICAL CARE IF:  You have abdominal pain.   You develop signs of infection, such as:   Chills.   Feeling unwell.  SEEK IMMEDIATE MEDICAL CARE IF:  You have difficulty swallowing.  You have worsening throat, chest, or abdominal pain.  You vomit.  You have bloody or very black stools.  You have a fever. This information is not intended to replace advice given to you by your health care provider. Make sure you discuss any questions you have with your health care provider. Document Released: 09/20/2013 Document Reviewed: 09/20/2013 Elsevier Interactive Patient Education  2017 [employer]  Laparoscopic Cholecystectomy, Care After This sheet gives you information about how to care for yourself after your procedure. Your health care provider may also give you more specific instructions. If you have problems or questions, contact your health care provider. What can I expect after the procedure? After the procedure, it is common to have:  Pain at your incision sites. You will be given medicines to control this pain.  Mild nausea or  vomiting.  Bloating and possible shoulder pain from the air-like gas that was used during the procedure. Follow these instructions at home: Incision care  Follow instructions from your health care provider about how to take care of your incisions. Make sure you:  Wash your hands with soap and water before you change your bandage (dressing). If soap and water are not available, use hand sanitizer.  Change your dressing as told by your health care provider.  Leave stitches (sutures), skin glue, or adhesive strips in place. These skin closures may need to be in place for 2 weeks or longer. If adhesive strip edges start to loosen and curl up, you may trim the loose edges. Do not remove adhesive strips completely unless your health care provider tells you to do that.  Do not take baths, swim, or use a hot tub until your health care provider approves. Ask your health care provider if you can take showers. You may only be allowed to take sponge baths for bathing.  Check your incision area every day for signs of infection. Check for:  More redness, swelling, or pain.  More fluid or blood.  Warmth.  Pus or a bad smell. Activity  Do not drive or use heavy machinery while taking prescription pain medicine.  Do not lift anything that is heavier than 10 lb (4.5 kg) until your health care provider approves.  Do not play contact sports until your health care provider approves.  Do not drive for 24 hours if you were given a medicine  to help you relax (sedative).  Rest as needed. Do not return to work or school until your health care provider approves. General instructions  Take over-the-counter and prescription medicines only as told by your health care provider.  To prevent or treat constipation while you are taking prescription pain medicine, your health care provider may recommend that you:  Drink enough fluid to keep your urine clear or pale yellow.  Take over-the-counter or  prescription medicines.  Eat foods that are high in fiber, such as fresh fruits and vegetables, whole grains, and beans.  Limit foods that are high in fat and processed sugars, such as fried and sweet foods. Contact a health care provider if:  You develop a rash.  You have more redness, swelling, or pain around your incisions.  You have more fluid or blood coming from your incisions.  Your incisions feel warm to the touch.  You have pus or a bad smell coming from your incisions.  You have a fever.  One or more of your incisions breaks open. Get help right away if:  You have trouble breathing.  You have chest pain.  You have increasing pain in your shoulders.  You faint or feel dizzy when you stand.  You have severe pain in your abdomen.  You have nausea or vomiting that lasts for more than one day.  You have leg pain. This information is not intended to replace advice given to you by your health care provider. Make sure you discuss any questions you have with your health care provider. Document Released: 11/30/2005 Document Revised: 06/20/2016 Document Reviewed: 05/18/2016 Elsevier Interactive Patient Education  2017 [employer]

## 2017-02-06 NOTE — Progress Notes (Signed)
SATURATION QUALIFICATIONS: (This note is used to comply with regulatory documentation for home oxygen)  Patient Saturations on Room Air at Rest = 91%

## 2017-02-06 NOTE — Progress Notes (Signed)
[facility] Office:  [phone] General Surgery Progress Note   LOS: 7 days  POD -  3 Days Post-Op  Assessment/Plan: 1.  LAPAROSCOPIC CHOLECYSTECTOMY WITH INTRAOP CHOLANGIOGRAM,  Trans cystic duct COMMON DUCT EXPLORATION - 02/03/2017 - D. [employer]  Labs okay.  PO intake better  Ok to d/c from surgery standpoint  2.  ERCP with sphincterotomy - 02/01/2017 - J. Madilyn FiremanHayes  Unable to clear CBD of stones 3.  Gall stone pancreatitis 4.  EtOH use  5.  Malnourished  Prealbumin - 8.0 - 02/03/2017 6.  Anemia   Hgb - 8.4 - 02/05/2017  7.  UTI - >100,000 colonies of E. Coli  On Cipro 8.  DVT prophylaxis - Lovenox 9.  Smokes - on nicoderm   Principal Problem:   Gallstone pancreatitis Active Problems:   Alcohol abuse   Hypokalemia   Macrocytic anemia   Metabolic acidosis   Protein-calorie malnutrition, severe  Subjective:  Feeling better  Objective:   Vitals:   02/05/17 1900 02/06/17 0511  BP: (!) 162/58 109/70  Pulse: 85 92  Resp: 18 18  Temp: 97.5 F (36.4 C) 97.6 F (36.4 C)     Intake/Output from previous day:  02/23 0701 - 02/24 0700 In: 1340 [P.O.:240; I.V.:900; IV Piggyback:200] Out: -   Intake/Output this shift:  No intake/output data recorded.   Physical Exam:   General: Older WF who is alert and oriented.    HEENT: Normal. Pupils equal. .   Lungs: Clear   Abdomen: Soft.  Has BS.    Wound: Okay\   Lab Results:     Recent Labs  02/05/17 0517 02/06/17 0533  WBC 10.3 8.8  HGB 8.4* 9.3*  HCT 26.3* 28.6*  PLT 283 316    BMET    Recent Labs  02/05/17 0517 02/06/17 0533  NA 138 137  K 3.6 2.8*  CL 105 101  CO2 27 28  GLUCOSE 190* 127*  BUN 8 9  CREATININE 1.11* 0.89  CALCIUM 7.7* 8.1*    PT/INR  No results for input(s): LABPROT, INR in the last 72 hours.  ABG  No results for input(s): PHART, HCO3 in the last 72 hours.  Invalid input(s): PCO2, PO2   Studies/Results:  Dg Chest 2 View  Result Date: 02/04/2017 CLINICAL DATA:   Post laparoscopic cholecystectomy yesterday, hypoxemia, postoperative fever, hypertension EXAM: CHEST  2 VIEW COMPARISON:  None FINDINGS: Upper normal heart size. Atherosclerotic calcification aorta. BILATERAL perihilar infiltrates and basilar pleural effusions. Probable components compressive atelectasis in the lower lobes. No pneumothorax. Bones unremarkable. IMPRESSION: Bibasilar pleural effusions and atelectasis with suspected mild perihilar infiltrates which could represent edema or infection. Electronically Signed   By: Ulyses SouthwardMark  Boles M.D.   On: 02/04/2017 18:01     Anti-infectives:   Anti-infectives    Start     Dose/Rate Route Frequency Ordered Stop   02/05/17 2200  ciprofloxacin (CIPRO) IVPB 400 mg     400 mg 200 mL/hr over 60 Minutes Intravenous Every 12 hours 02/05/17 1612 02/07/17 0959   02/03/17 2200  ciprofloxacin (CIPRO) IVPB 400 mg  Status:  Discontinued     400 mg 200 mL/hr over 60 Minutes Intravenous Every 12 hours 02/03/17 1729 02/03/17 1752   02/01/17 1000  ciprofloxacin (CIPRO) IVPB 400 mg  Status:  Discontinued     400 mg 200 mL/hr over 60 Minutes Intravenous Every 12 hours 02/01/17 0908 02/05/17 1612   01/31/17 1000  ciprofloxacin (CIPRO) IVPB 200 mg  Status:  Discontinued  200 mg 100 mL/hr over 60 Minutes Intravenous Every 12 hours 01/31/17 0940 02/01/17 0908      Vanita Panda, MD  Colorectal and General Surgery [facility]

## 2017-02-08 DIAGNOSIS — F17210 Nicotine dependence, cigarettes, uncomplicated: Secondary | ICD-10-CM | POA: Diagnosis not present

## 2017-02-08 DIAGNOSIS — E43 Unspecified severe protein-calorie malnutrition: Secondary | ICD-10-CM | POA: Diagnosis not present

## 2017-02-08 DIAGNOSIS — D539 Nutritional anemia, unspecified: Secondary | ICD-10-CM | POA: Diagnosis not present

## 2017-02-08 DIAGNOSIS — F1020 Alcohol dependence, uncomplicated: Secondary | ICD-10-CM | POA: Diagnosis not present

## 2017-02-08 DIAGNOSIS — Z48815 Encounter for surgical aftercare following surgery on the digestive system: Secondary | ICD-10-CM | POA: Diagnosis not present

## 2017-02-08 DIAGNOSIS — J449 Chronic obstructive pulmonary disease, unspecified: Secondary | ICD-10-CM | POA: Diagnosis not present

## 2017-02-09 ENCOUNTER — Encounter (HOSPITAL_COMMUNITY): Payer: Self-pay | Admitting: Surgery

## 2017-02-11 ENCOUNTER — Observation Stay (HOSPITAL_COMMUNITY)
Admission: EM | Admit: 2017-02-11 | Discharge: 2017-02-12 | Disposition: A | Discharge status: Home / Self Care | Payer: PPO | Attending: Internal Medicine | Admitting: Internal Medicine

## 2017-02-11 ENCOUNTER — Emergency Department (HOSPITAL_COMMUNITY): Payer: PPO

## 2017-02-11 ENCOUNTER — Encounter (HOSPITAL_COMMUNITY): Payer: Self-pay

## 2017-02-11 ENCOUNTER — Other Ambulatory Visit: Payer: Self-pay

## 2017-02-11 DIAGNOSIS — Y906 Blood alcohol level of 120-199 mg/100 ml: Secondary | ICD-10-CM | POA: Diagnosis not present

## 2017-02-11 DIAGNOSIS — F329 Major depressive disorder, single episode, unspecified: Secondary | ICD-10-CM | POA: Diagnosis not present

## 2017-02-11 DIAGNOSIS — I119 Hypertensive heart disease without heart failure: Secondary | ICD-10-CM | POA: Insufficient documentation

## 2017-02-11 DIAGNOSIS — G629 Polyneuropathy, unspecified: Secondary | ICD-10-CM | POA: Diagnosis not present

## 2017-02-11 DIAGNOSIS — W19XXXA Unspecified fall, initial encounter: Secondary | ICD-10-CM | POA: Diagnosis not present

## 2017-02-11 DIAGNOSIS — R404 Transient alteration of awareness: Secondary | ICD-10-CM | POA: Diagnosis not present

## 2017-02-11 DIAGNOSIS — F10220 Alcohol dependence with intoxication, uncomplicated: Secondary | ICD-10-CM | POA: Diagnosis not present

## 2017-02-11 DIAGNOSIS — Z79899 Other long term (current) drug therapy: Secondary | ICD-10-CM | POA: Diagnosis not present

## 2017-02-11 DIAGNOSIS — S3210XA Unspecified fracture of sacrum, initial encounter for closed fracture: Secondary | ICD-10-CM | POA: Diagnosis not present

## 2017-02-11 DIAGNOSIS — S199XXA Unspecified injury of neck, initial encounter: Secondary | ICD-10-CM | POA: Diagnosis not present

## 2017-02-11 DIAGNOSIS — F17210 Nicotine dependence, cigarettes, uncomplicated: Secondary | ICD-10-CM | POA: Diagnosis not present

## 2017-02-11 DIAGNOSIS — R296 Repeated falls: Secondary | ICD-10-CM

## 2017-02-11 DIAGNOSIS — F1010 Alcohol abuse, uncomplicated: Secondary | ICD-10-CM | POA: Insufficient documentation

## 2017-02-11 DIAGNOSIS — S322XXA Fracture of coccyx, initial encounter for closed fracture: Secondary | ICD-10-CM | POA: Insufficient documentation

## 2017-02-11 DIAGNOSIS — F10920 Alcohol use, unspecified with intoxication, uncomplicated: Secondary | ICD-10-CM

## 2017-02-11 DIAGNOSIS — E876 Hypokalemia: Secondary | ICD-10-CM | POA: Diagnosis not present

## 2017-02-11 DIAGNOSIS — S299XXA Unspecified injury of thorax, initial encounter: Secondary | ICD-10-CM | POA: Diagnosis not present

## 2017-02-11 DIAGNOSIS — S0990XA Unspecified injury of head, initial encounter: Secondary | ICD-10-CM | POA: Diagnosis not present

## 2017-02-11 DIAGNOSIS — R51 Headache: Secondary | ICD-10-CM | POA: Diagnosis not present

## 2017-02-11 DIAGNOSIS — R531 Weakness: Secondary | ICD-10-CM

## 2017-02-11 DIAGNOSIS — F419 Anxiety disorder, unspecified: Secondary | ICD-10-CM | POA: Insufficient documentation

## 2017-02-11 LAB — URINALYSIS, ROUTINE W REFLEX MICROSCOPIC
BILIRUBIN URINE: NEGATIVE
Glucose, UA: NEGATIVE mg/dL
Hgb urine dipstick: NEGATIVE
Ketones, ur: NEGATIVE mg/dL
Leukocytes, UA: NEGATIVE
NITRITE: NEGATIVE
PROTEIN: NEGATIVE mg/dL
Specific Gravity, Urine: 1.004 — ABNORMAL LOW (ref 1.005–1.030)
pH: 5 (ref 5.0–8.0)

## 2017-02-11 LAB — CBC
HCT: 31.7 % — ABNORMAL LOW (ref 36.0–46.0)
Hemoglobin: 10.3 g/dL — ABNORMAL LOW (ref 12.0–15.0)
MCH: 32.8 pg (ref 26.0–34.0)
MCHC: 32.5 g/dL (ref 30.0–36.0)
MCV: 101 fL — ABNORMAL HIGH (ref 78.0–100.0)
PLATELETS: 495 10*3/uL — AB (ref 150–400)
RBC: 3.14 MIL/uL — ABNORMAL LOW (ref 3.87–5.11)
RDW: 21.5 % — AB (ref 11.5–15.5)
WBC: 7.5 10*3/uL (ref 4.0–10.5)

## 2017-02-11 LAB — BASIC METABOLIC PANEL
Anion gap: 12 (ref 5–15)
CALCIUM: 8.2 mg/dL — AB (ref 8.9–10.3)
CO2: 28 mmol/L (ref 22–32)
CREATININE: 1.09 mg/dL — AB (ref 0.44–1.00)
Chloride: 99 mmol/L — ABNORMAL LOW (ref 101–111)
GFR calc Af Amer: 59 mL/min — ABNORMAL LOW (ref >60)
GFR, EST NON AFRICAN AMERICAN: 51 mL/min — AB (ref >60)
Glucose, Bld: 89 mg/dL (ref 65–99)
POTASSIUM: 2.4 mmol/L — AB (ref 3.5–5.1)
SODIUM: 139 mmol/L (ref 135–145)

## 2017-02-11 LAB — ETHANOL: ALCOHOL ETHYL (B): 136 mg/dL — AB (ref <5)

## 2017-02-11 LAB — MAGNESIUM: MAGNESIUM: 1.4 mg/dL — AB (ref 1.7–2.4)

## 2017-02-11 LAB — PHOSPHORUS: PHOSPHORUS: 4.9 mg/dL — AB (ref 2.5–4.6)

## 2017-02-11 LAB — CBG MONITORING, ED: Glucose-Capillary: 84 mg/dL (ref 65–99)

## 2017-02-11 MED ORDER — POTASSIUM CHLORIDE CRYS ER 20 MEQ PO TBCR
40.0000 meq | EXTENDED_RELEASE_TABLET | Freq: Two times a day (BID) | ORAL | Status: DC
  Administered 2017-02-11 – 2017-02-12 (×3): 40 meq via ORAL
  Filled 2017-02-11 (×3): qty 2

## 2017-02-11 MED ORDER — NICOTINE 21 MG/24HR TD PT24
21.0000 mg | MEDICATED_PATCH | Freq: Every day | TRANSDERMAL | Status: DC
  Administered 2017-02-11 – 2017-02-12 (×2): 21 mg via TRANSDERMAL
  Filled 2017-02-11 (×2): qty 1

## 2017-02-11 MED ORDER — KCL IN DEXTROSE-NACL 20-5-0.45 MEQ/L-%-% IV SOLN
INTRAVENOUS | Status: AC
  Administered 2017-02-11: 14:00:00 via INTRAVENOUS
  Filled 2017-02-11 (×3): qty 1000

## 2017-02-11 MED ORDER — ADULT MULTIVITAMIN W/MINERALS CH
1.0000 | ORAL_TABLET | Freq: Every day | ORAL | Status: DC
  Administered 2017-02-11 – 2017-02-12 (×2): 1 via ORAL
  Filled 2017-02-11 (×2): qty 1

## 2017-02-11 MED ORDER — LORAZEPAM 1 MG PO TABS
1.0000 mg | ORAL_TABLET | Freq: Four times a day (QID) | ORAL | Status: DC | PRN
  Filled 2017-02-11 (×2): qty 1

## 2017-02-11 MED ORDER — AMITRIPTYLINE HCL 50 MG PO TABS
25.0000 mg | ORAL_TABLET | Freq: Every evening | ORAL | Status: DC | PRN
  Administered 2017-02-12: 25 mg via ORAL
  Filled 2017-02-11: qty 1

## 2017-02-11 MED ORDER — ONDANSETRON HCL 4 MG/2ML IJ SOLN: 4.0000 mg | Freq: Four times a day (QID) | INTRAMUSCULAR | Status: DC | PRN

## 2017-02-11 MED ORDER — HYDROMORPHONE HCL 2 MG PO TABS
1.0000 mg | ORAL_TABLET | Freq: Four times a day (QID) | ORAL | Status: DC | PRN
  Administered 2017-02-11 – 2017-02-12 (×4): 1 mg via ORAL
  Filled 2017-02-11 (×4): qty 1

## 2017-02-11 MED ORDER — ACETAMINOPHEN 325 MG PO TABS
650.0000 mg | ORAL_TABLET | Freq: Four times a day (QID) | ORAL | Status: DC | PRN
  Administered 2017-02-11 – 2017-02-12 (×2): 650 mg via ORAL
  Filled 2017-02-11 (×2): qty 2

## 2017-02-11 MED ORDER — LORAZEPAM 2 MG/ML IJ SOLN
1.0000 mg | Freq: Four times a day (QID) | INTRAMUSCULAR | Status: DC | PRN
  Administered 2017-02-11: 1 mg via INTRAVENOUS
  Filled 2017-02-11 (×2): qty 1

## 2017-02-11 MED ORDER — FOLIC ACID 1 MG PO TABS
1.0000 mg | ORAL_TABLET | Freq: Every day | ORAL | Status: DC
  Administered 2017-02-11 – 2017-02-12 (×2): 1 mg via ORAL
  Filled 2017-02-11 (×2): qty 1

## 2017-02-11 MED ORDER — ACETAMINOPHEN 650 MG RE SUPP: 650.0000 mg | Freq: Four times a day (QID) | RECTAL | Status: DC | PRN

## 2017-02-11 MED ORDER — MAGNESIUM SULFATE 2 GM/50ML IV SOLN
2.0000 g | Freq: Once | INTRAVENOUS | Status: AC
  Administered 2017-02-11: 2 g via INTRAVENOUS
  Filled 2017-02-11: qty 50

## 2017-02-11 MED ORDER — ADULT MULTIVITAMIN W/MINERALS CH: 1.0000 | ORAL_TABLET | Freq: Every day | ORAL | Status: DC

## 2017-02-11 MED ORDER — SODIUM CHLORIDE 0.9 % IV BOLUS (SEPSIS)
1000.0000 mL | Freq: Once | INTRAVENOUS | Status: AC
  Administered 2017-02-11: 1000 mL via INTRAVENOUS

## 2017-02-11 MED ORDER — SODIUM CHLORIDE 0.9% FLUSH
3.0000 mL | Freq: Two times a day (BID) | INTRAVENOUS | Status: DC
  Administered 2017-02-11 – 2017-02-12 (×2): 3 mL via INTRAVENOUS

## 2017-02-11 MED ORDER — FOLIC ACID 1 MG PO TABS: 1.0000 mg | ORAL_TABLET | Freq: Every day | ORAL | Status: DC

## 2017-02-11 MED ORDER — VITAMIN B-1 100 MG PO TABS
100.0000 mg | ORAL_TABLET | Freq: Every day | ORAL | Status: DC
  Administered 2017-02-11 – 2017-02-12 (×2): 100 mg via ORAL
  Filled 2017-02-11 (×2): qty 1

## 2017-02-11 MED ORDER — SODIUM CHLORIDE 0.9 % IV SOLN
30.0000 meq | Freq: Once | INTRAVENOUS | Status: DC
  Filled 2017-02-11: qty 15

## 2017-02-11 MED ORDER — VITAMIN B-12 1000 MCG PO TABS
1000.0000 ug | ORAL_TABLET | Freq: Every day | ORAL | Status: DC
  Administered 2017-02-11 – 2017-02-12 (×2): 1000 ug via ORAL
  Filled 2017-02-11 (×2): qty 1

## 2017-02-11 MED ORDER — VITAMIN B-1 100 MG PO TABS: 100.0000 mg | ORAL_TABLET | Freq: Every day | ORAL | Status: DC

## 2017-02-11 MED ORDER — SODIUM CHLORIDE 0.9 % IV SOLN
30.0000 meq | Freq: Once | INTRAVENOUS | Status: AC
  Administered 2017-02-11: 30 meq via INTRAVENOUS
  Filled 2017-02-11: qty 15

## 2017-02-11 MED ORDER — ALBUTEROL SULFATE (2.5 MG/3ML) 0.083% IN NEBU: 2.5000 mg | INHALATION_SOLUTION | RESPIRATORY_TRACT | Status: DC | PRN

## 2017-02-11 MED ORDER — ONDANSETRON HCL 4 MG PO TABS: 4.0000 mg | ORAL_TABLET | Freq: Four times a day (QID) | ORAL | Status: DC | PRN

## 2017-02-11 MED ORDER — THIAMINE HCL 100 MG/ML IJ SOLN
100.0000 mg | Freq: Every day | INTRAMUSCULAR | Status: DC
  Filled 2017-02-11: qty 2

## 2017-02-11 NOTE — ED Provider Notes (Signed)
[facility]-EMERGENCY DEPT Provider Note   CSN: [id number] Arrival date & time: 02/11/17  0602     History   Chief Complaint Chief Complaint  Patient presents with  . Weakness  . Fall    HPI April [name] is a 71 y.o. female.  HPI    Pt with hx ETOH abuse, HTN, HLD, current smoker, peripheral neuropathy p/w 3 falls following d/c from the hospital 02/08/17.  Pt was admitted for cholecystitis/choledocholithiasis and had laparoscopic chole with CBD stone removal.  Pt was recommended by PT to go to SNF but pt declined.   At home she has been very weak, has fallen due to weakness three times.  Has hit her head and fallen on her tailbone, has pain in both.  Denies syncope. She is not eating or drinking much due to lack of appetite.  Daughter reports patient's blood pressure was 81/42 this morning so she brought her in.  Pt denies fevers, abdominal pain, urinary symptoms, vomiting, cough/SOB/chest pain, increase in chronic leg swelling.  Is having very small bowel movements.    Past Medical History:  Diagnosis Date  . Anxiety   . Current smoker   . Depression   . ETOH abuse   . GERD (gastroesophageal reflux disease)   . History of shingles summer 2015  . HTN (hypertension)   . Hyperlipidemia   . Hypertension   . Leg edema   . Neuropathy (HCC)   . Neuropathy [facility](HCC)     Patient Active Problem List   Diagnosis Date Noted  . Frequent falls 02/11/2017  . Protein-calorie malnutrition, severe 02/05/2017  . Gallstone pancreatitis 01/30/2017  . Hypokalemia 01/30/2017  . Macrocytic anemia 01/30/2017  . Metabolic acidosis 01/30/2017  . Neuropathy, alcoholic (HCC) 09/08/2016  . Neuropathy (HCC) 12/11/2015  . Alcohol abuse 12/11/2015  . Tobacco abuse 12/11/2015  . Chronic venous insufficiency 09/13/2015  . Extremity numbness 09/13/2015    Past Surgical History:  Procedure Laterality Date  . ERCP N/A 02/01/2017   Procedure: ENDOSCOPIC RETROGRADE CHOLANGIOPANCREATOGRAPHY (ERCP);   Surgeon: Dorena CookeyJohn Hayes, MD;  Location: [facility];  Service: Endoscopy;  Laterality: N/A;  . LAPAROSCOPIC CHOLECYSTECTOMY WITH COMMON DUCT EXPLORATION AND INTRAOP CHOLANGIOGRAM N/A 02/03/2017   Procedure: LAPAROSCOPIC CHOLECYSTECTOMY WITH POSSIBLE COMMON DUCT EXPLORATION AND INTRAOP CHOLANGIOGRAM;  Surgeon: Ovidio Kinavid Newman, MD;  Location: [facility];  Service: General;  Laterality: N/A;  . removal of uterine fibroids  2004    OB History    No data available       Home Medications    Prior to Admission medications   Medication Sig Start Date End Date Taking? Authorizing Provider  amitriptyline (ELAVIL) 25 MG tablet Take 25 mg by mouth at bedtime as needed for sleep.  08/27/16  Yes Historical Provider, MD  Aspirin-Acetaminophen-Caffeine (GOODY HEADACHE PO) Take 1 each by mouth daily as needed (headache).   Yes Historical Provider, MD  clonazePAM (KLONOPIN) 0.5 MG tablet Take 0.5 mg by mouth 2 (two) times daily as needed for anxiety.   Yes Historical Provider, MD  gabapentin (NEURONTIN) 300 MG capsule Take 2 capsules (600 mg total) by mouth 3 (three) times daily. 10/29/16  Yes Anson FretAntonia B Ahern, MD  ibuprofen (ADVIL,MOTRIN) 200 MG tablet Take 400 mg by mouth every 6 (six) hours as needed for moderate pain.   Yes Historical Provider, MD  vitamin B-12 1000 MCG tablet Take 1 tablet (1,000 mcg total) by mouth daily. 02/07/17  Yes Tyrone Nineyan B Grunz, MD  Vitamin D, Ergocalciferol, (DRISDOL) 50000 units CAPS  capsule Take 50,000 Units by mouth once a week. 07/31/16  Yes Historical Provider, MD    Family History Family History  Problem Relation Age of Onset  . Heart disease Father   . Neuropathy Neg Hx     Social History Social History  Substance Use Topics  . Smoking status: Current Every Day Smoker    Packs/day: 1.00    Types: Cigarettes  . Smokeless tobacco: Never Used  . Alcohol use 0.6 oz/week    1 Shots of liquor per week     Comment: drinks brandy midafternoon and evening; last drink 3 days ago       Allergies   Naltrexone and Penicillins   Review of Systems Review of Systems  All other systems reviewed and are negative.    Physical Exam Updated Vital Signs BP 115/66   Pulse 101   Temp 98.4 F (36.9 C) (Oral)   Resp 14   SpO2 90%   Physical Exam  Constitutional: She appears well-developed and well-nourished. No distress.  HENT:  Head: Normocephalic and atraumatic.  Neck: Neck supple.  Cardiovascular: Normal rate and regular rhythm.   Pulmonary/Chest: Effort normal. No respiratory distress. She has no wheezes. She has rales (bilateral bases ).  Abdominal: Soft. She exhibits no distension. There is no tenderness. There is no rebound and no guarding.  Laparoscopic incisions clean, dry, and intact  Musculoskeletal: She exhibits no edema.  Neurological: She is alert.  Skin: She is not diaphoretic.  Nursing note and vitals reviewed.    ED Treatments / Results  Labs (all labs ordered are listed, but only abnormal results are displayed) Labs Reviewed  BASIC METABOLIC PANEL - Abnormal; Notable for the following:       Result Value   Potassium 2.4 (*)    Chloride 99 (*)    BUN <5 (*)    Creatinine, Ser 1.09 (*)    Calcium 8.2 (*)    GFR calc non Af Amer 51 (*)    GFR calc Af Amer 59 (*)    All other components within normal limits  CBC - Abnormal; Notable for the following:    RBC 3.14 (*)    Hemoglobin 10.3 (*)    HCT 31.7 (*)    MCV 101.0 (*)    RDW 21.5 (*)    Platelets 495 (*)    All other components within normal limits  URINALYSIS, ROUTINE W REFLEX MICROSCOPIC - Abnormal; Notable for the following:    Color, Urine STRAW (*)    Specific Gravity, Urine 1.004 (*)    All other components within normal limits  ETHANOL - Abnormal; Notable for the following:    Alcohol, Ethyl (B) 136 (*)    All other components within normal limits  MAGNESIUM - Abnormal; Notable for the following:    Magnesium 1.4 (*)    All other components within normal limits   PHOSPHORUS - Abnormal; Notable for the following:    Phosphorus 4.9 (*)    All other components within normal limits  URINE CULTURE  CBG MONITORING, ED    EKG  EKG Interpretation  Date/Time:  Thursday February 11 2017 06:14:59 EST Ventricular Rate:  78 PR Interval:    QRS Duration: 92 QT Interval:  422 QTC Calculation: 481 R Axis:   -62 Text Interpretation:  Sinus rhythm Left anterior fascicular block Anteroseptal infarct, age indeterminate Nonspecific T wave abnormality Confirmed by Manus Gunning  MD, STEPHEN [phone]) on 02/11/2017 6:24:22 AM Also confirmed by Manus Gunning  MD,  STEPHEN [phone]), editor Valentina Lucks CT, Jola Babinski [phone])  on 02/11/2017 6:48:32 AM       Radiology Dg Chest 2 View  Result Date: 02/11/2017 CLINICAL DATA:  Fall. EXAM: CHEST  2 VIEW COMPARISON:  02/04/2017 . FINDINGS: Mediastinum and hilar structures normal. Interim clearing of pulmonary edema. Mild left base subsegmental atelectasis . Cardiomegaly with normal pulmonary vascularity. No acute bony abnormality . IMPRESSION: 1. Cardiomegaly. Normal pulmonary vascularity. Interim clearing of pulmonary edema. 2. Mild left base subsegmental atelectasis. Small bilateral pleural effusions. Electronically Signed   By: Maisie Fus  Register   On: 02/11/2017 07:35   Dg Sacrum/coccyx  Result Date: 02/11/2017 CLINICAL DATA:  Fall. EXAM: SACRUM AND COCCYX - 2+ VIEW COMPARISON:  MRI 01/31/2017.  CT 01/30/2017 . FINDINGS: Slightly displaced fracture of the lower sacrum/ upper coccyx. Degenerative changes lumbar spine and both hips. Aortoiliac atherosclerotic vascular calcification IMPRESSION: Slightly displaced fracture of the lower sacrum/ upper coccyx . Electronically Signed   By: Maisie Fus  Register   On: 02/11/2017 07:37   Ct Head Wo Contrast  Result Date: 02/11/2017 CLINICAL DATA:  Lost her balance yesterday and fell, struck back of head, frontal headache, history hypertension, hyperlipidemia, smoker EXAM: CT HEAD WITHOUT CONTRAST CT CERVICAL SPINE  WITHOUT CONTRAST TECHNIQUE: Multidetector CT imaging of the head and cervical spine was performed following the standard protocol without intravenous contrast. Multiplanar CT image reconstructions of the cervical spine were also generated. COMPARISON:  01/30/2017 FINDINGS: CT HEAD FINDINGS Brain: Mild generalized atrophy. Normal ventricular morphology. No midline shift or mass effect. Otherwise normal appearance of brain parenchyma. No intracranial hemorrhage, mass lesion, or evidence of acute infarction. No extra-axial fluid collections. Vascular: Atherosclerotic calcifications at the carotid siphons Skull: Intact Sinuses/Orbits: Small amount of fluid and mucosal thickening in the LEFT maxillary sinus. Nasal septal deviation to the LEFT. Other: N/A CT CERVICAL SPINE FINDINGS Alignment: Normal on subluxation Skull base and vertebrae: Osseous mineralization. Facet degenerative changes LEFT C4-C5. No fracture or bone destruction. Visualized skullbase intact. Soft tissues and spinal canal: Atherosclerotic calcifications at the carotid bifurcations bilaterally, proximal great vessels and cranial aspect of aortic arch. Prevertebral soft tissues normal thickness. No other regional soft tissue abnormality identified. Disc levels: Multilevel disc space narrowing with endplate spur formation most pronounced at C5-C6 and C6-C7. Small uncovertebral spurs encroach upon the LEFT C5-C6 and BILATERAL C6-C7 neural foramina. Upper chest: 3 mm nodular density RIGHT apex unchanged. Several tiny additional LEFT upper lobe nodules noted. Other: Scattered beam hardening artifacts of dental origin. IMPRESSION: Generalized atrophy. No acute intracranial abnormalities. Multilevel degenerative disc and facet disease changes cervical spine. No acute cervical spine abnormalities. Tiny nonspecific nodular foci at lung apices. Electronically Signed   By: Ulyses Southward M.D.   On: 02/11/2017 08:20   Ct Cervical Spine Wo Contrast  Result Date:  02/11/2017 CLINICAL DATA:  Lost her balance yesterday and fell, struck back of head, frontal headache, history hypertension, hyperlipidemia, smoker EXAM: CT HEAD WITHOUT CONTRAST CT CERVICAL SPINE WITHOUT CONTRAST TECHNIQUE: Multidetector CT imaging of the head and cervical spine was performed following the standard protocol without intravenous contrast. Multiplanar CT image reconstructions of the cervical spine were also generated. COMPARISON:  01/30/2017 FINDINGS: CT HEAD FINDINGS Brain: Mild generalized atrophy. Normal ventricular morphology. No midline shift or mass effect. Otherwise normal appearance of brain parenchyma. No intracranial hemorrhage, mass lesion, or evidence of acute infarction. No extra-axial fluid collections. Vascular: Atherosclerotic calcifications at the carotid siphons Skull: Intact Sinuses/Orbits: Small amount of fluid and mucosal thickening in the LEFT  maxillary sinus. Nasal septal deviation to the LEFT. Other: N/A CT CERVICAL SPINE FINDINGS Alignment: Normal on subluxation Skull base and vertebrae: Osseous mineralization. Facet degenerative changes LEFT C4-C5. No fracture or bone destruction. Visualized skullbase intact. Soft tissues and spinal canal: Atherosclerotic calcifications at the carotid bifurcations bilaterally, proximal great vessels and cranial aspect of aortic arch. Prevertebral soft tissues normal thickness. No other regional soft tissue abnormality identified. Disc levels: Multilevel disc space narrowing with endplate spur formation most pronounced at C5-C6 and C6-C7. Small uncovertebral spurs encroach upon the LEFT C5-C6 and BILATERAL C6-C7 neural foramina. Upper chest: 3 mm nodular density RIGHT apex unchanged. Several tiny additional LEFT upper lobe nodules noted. Other: Scattered beam hardening artifacts of dental origin. IMPRESSION: Generalized atrophy. No acute intracranial abnormalities. Multilevel degenerative disc and facet disease changes cervical spine. No acute  cervical spine abnormalities. Tiny nonspecific nodular foci at lung apices. Electronically Signed   By: Ulyses Southward M.D.   On: 02/11/2017 08:20    Procedures Procedures (including critical care time)  Medications Ordered in ED Medications  vitamin B-12 (CYANOCOBALAMIN) tablet 1,000 mcg (1,000 mcg Oral Given 02/11/17 1422)  amitriptyline (ELAVIL) tablet 25 mg (not administered)  sodium chloride flush (NS) 0.9 % injection 3 mL (3 mLs Intravenous Given 02/11/17 1254)  dextrose 5 % and 0.45 % NaCl with KCl 20 mEq/L infusion ( Intravenous New Bag/Given 02/11/17 1421)  acetaminophen (TYLENOL) tablet 650 mg (650 mg Oral Given 02/11/17 0936)    Or  acetaminophen (TYLENOL) suppository 650 mg ( Rectal See Alternative 02/11/17 0936)  ondansetron (ZOFRAN) tablet 4 mg (not administered)    Or  ondansetron (ZOFRAN) injection 4 mg (not administered)  folic acid (FOLVITE) tablet 1 mg (1 mg Oral Given 02/11/17 1254)  multivitamin with minerals tablet 1 tablet (1 tablet Oral Given 02/11/17 1254)  albuterol (PROVENTIL) (2.5 MG/3ML) 0.083% nebulizer solution 2.5 mg (not administered)  potassium chloride SA (K-DUR,KLOR-CON) CR tablet 40 mEq (40 mEq Oral Given 02/11/17 1231)  potassium chloride 30 mEq in sodium chloride 0.9 % 265 mL (KCL MULTIRUN) IVPB (30 mEq Intravenous Not Given 02/11/17 1422)  LORazepam (ATIVAN) tablet 1 mg (not administered)    Or  LORazepam (ATIVAN) injection 1 mg (not administered)  thiamine (VITAMIN B-1) tablet 100 mg (100 mg Oral Given 02/11/17 1230)    Or  thiamine (B-1) injection 100 mg ( Intravenous See Alternative 02/11/17 1230)  nicotine (NICODERM CQ - dosed in mg/24 hours) patch 21 mg (21 mg Transdermal Patch Applied 02/11/17 1232)  HYDROmorphone (DILAUDID) tablet 1 mg (not administered)  potassium chloride 30 mEq in sodium chloride 0.9 % 265 mL (KCL MULTIRUN) IVPB (30 mEq Intravenous Given 02/11/17 0828)  sodium chloride 0.9 % bolus 1,000 mL (0 mLs Intravenous Stopped 02/11/17 1155)  magnesium  sulfate IVPB 2 g 50 mL (0 g Intravenous Stopped 02/11/17 1452)     Initial Impression / Assessment and Plan / ED Course  I have reviewed the triage vital signs and the nursing notes.  Pertinent labs & imaging results that were available during my care of the patient were reviewed by me and considered in my medical decision making (see chart for details).    Afebrile nontoxic patient with generalized weakness and multiple falls x 1 week.  Has a fractured sacrum.  She was admitted last week for surgery and pt was advised to go to SNF but she declined.  Is back given generalized weakness, inability to walk.  Labs significant for hypokalemia 2.4, repletion started in ED.  Pt is an alcoholic and will need CIWA protocol.  Admitted to [facility], Dr Melynda Ripple accepting.    Final Clinical Impressions(s) / ED Diagnoses   Final diagnoses:  Fall, initial encounter  Closed fracture of sacrum, unspecified portion of sacrum, initial encounter (HCC)  Alcoholic intoxication without complication (HCC)  Weakness    New Prescriptions New Prescriptions   No medications on file     Trixie Dredge, PA-C 02/11/17 1534    Glynn Octave, MD 02/11/17 [phone]

## 2017-02-11 NOTE — ED Notes (Signed)
Pt gone to CT and XRay when this RN was rounding

## 2017-02-11 NOTE — ED Notes (Signed)
Patient transported to X-ray 

## 2017-02-11 NOTE — H&P (Signed)
[facility] History and Physical  April [name] ZOX:[id number] DOB: 12/21/1945 DOA: 02/11/2017  Referring physician:  PCP: Lilia Argue   Chief Complaint: "I broke my tailbone."  HPI: April [name] is a 71 y.o. female  with past medical history significant for alcohol abuse, high blood pressure at hospital discharge on February 26 after episode of pancreatitis and gallbladder removal. Patient presented for repeated falls. Patient daughter had conservative possible tailbone fracture. Patient's last fall was on the 27th. She has been ambulating with a walker since. She has had no issues except for pain. Patient has continued to smoke and drink brandy daily. Patient has not been confused. Denies hitting her head. Other than pain in her bottom patient denies any other symptoms.  ED course: Negative urinalysis. Negative CT head and C-spine. Potassium was found to be critically low. Hospitalist was consulted for admission.   Review of Systems:  As per HPI otherwise 10 point review of systems negative.    Past Medical History:  Diagnosis Date  . Anxiety   . Current smoker   . Depression   . ETOH abuse   . GERD (gastroesophageal reflux disease)   . History of shingles summer 2015  . HTN (hypertension)   . Hyperlipidemia   . Hypertension   . Leg edema   . Neuropathy (HCC)   . Neuropathy [facility])    Past Surgical History:  Procedure Laterality Date  . ERCP N/A 02/01/2017   Procedure: ENDOSCOPIC RETROGRADE CHOLANGIOPANCREATOGRAPHY (ERCP);  Surgeon: Dorena Cookey, MD;  Location: [facility];  Service: Endoscopy;  Laterality: N/A;  . LAPAROSCOPIC CHOLECYSTECTOMY WITH COMMON DUCT EXPLORATION AND INTRAOP CHOLANGIOGRAM N/A 02/03/2017   Procedure: LAPAROSCOPIC CHOLECYSTECTOMY WITH POSSIBLE COMMON DUCT EXPLORATION AND INTRAOP CHOLANGIOGRAM;  Surgeon: Ovidio Kin, MD;  Location: [facility]S;  Service: General;  Laterality: N/A;  . removal of uterine fibroids  2004   Social History:   reports that she has been smoking Cigarettes.  She has been smoking about 1.00 pack per day. She has never used smokeless tobacco. She reports that she drinks about 0.6 oz of alcohol per week . She reports that she does not use drugs.  Allergies  Allergen Reactions  . Naltrexone Shortness Of Breath  . Penicillins Rash    Has patient had a PCN reaction causing immediate rash, facial/tongue/throat swelling, SOB or lightheadedness with hypotension: Yes immediate rash  Has patient had a PCN reaction causing severe rash involving mucus membranes or skin necrosis: Yes Has patient had a PCN reaction that required hospitalization": No Has patient had a PCN reaction occurring within the last 10 years: Yes If all of the above answers are "NO", then may proceed with Cephalosporin use.     Family History  Problem Relation Age of Onset  . Heart disease Father   . Neuropathy Neg Hx      Prior to Admission medications   Medication Sig Start Date End Date Taking? Authorizing Provider  amitriptyline (ELAVIL) 25 MG tablet Take 25 mg by mouth at bedtime as needed for sleep.  08/27/16  Yes Historical Provider, MD  Aspirin-Acetaminophen-Caffeine (GOODY HEADACHE PO) Take 1 each by mouth daily as needed (headache).   Yes Historical Provider, MD  clonazePAM (KLONOPIN) 0.5 MG tablet Take 0.5 mg by mouth 2 (two) times daily as needed for anxiety.   Yes Historical Provider, MD  gabapentin (NEURONTIN) 300 MG capsule Take 2 capsules (600 mg total) by mouth 3 (three) times daily. 10/29/16  Yes Anson Fret, MD  ibuprofen (  ADVIL,MOTRIN) 200 MG tablet Take 400 mg by mouth every 6 (six) hours as needed for moderate pain.   Yes Historical Provider, MD  vitamin B-12 1000 MCG tablet Take 1 tablet (1,000 mcg total) by mouth daily. 02/07/17  Yes Tyrone Nine, MD  Vitamin D, Ergocalciferol, (DRISDOL) 50000 units CAPS capsule Take 50,000 Units by mouth once a week. 07/31/16  Yes Historical Provider, MD   Physical  Exam: Vitals:   02/11/17 0830 02/11/17 0831 02/11/17 0845 02/11/17 0900  BP: 109/65 111/65 119/63 110/69  Pulse: 77 76 75 76  Resp:  15 12 15   Temp:      TempSrc:      SpO2:  93% 93% 94%    Wt Readings from Last 3 Encounters:  02/05/17 61 kg (134 lb 7.7 oz)  09/08/16 55.3 kg (122 lb)  04/20/16 52.7 kg (116 lb 3.2 oz)    General:  Appears calm and comfortable, A&Ox3 Eyes:  PERRL, EOMI, normal lids, iris ENT:  grossly normal hearing, lips & tongue Neck:  no LAD, masses or thyromegaly Cardiovascular:  RRR, no m/r/g. No LE edema.  Respiratory:  CTA bilaterally anterior exam only, no w/r/r. Normal respiratory effort. Abdomen:  soft, ntnd Skin:  no rash or induration seen on limited exam Musculoskeletal:  grossly normal tone BUE/BLE; anterior pelvis tenderness Psychiatric:  grossly normal mood and affect, speech fluent and appropriate Neurologic:  CN 2-12 grossly intact, moves all extremities in coordinated fashion.          Labs on Admission:  Basic Metabolic Panel:  Recent Labs Lab 02/05/17 0517 02/06/17 0533 02/11/17 0628  NA 138 137 139  K 3.6 2.8* 2.4*  CL 105 101 99*  CO2 27 28 28   GLUCOSE 190* 127* 89  BUN 8 9 <5*  CREATININE 1.11* 0.89 1.09*  CALCIUM 7.7* 8.1* 8.2*   Liver Function Tests:  Recent Labs Lab 02/05/17 0517 02/06/17 0533  AST 61* 36  ALT 39 30  ALKPHOS 108 112  BILITOT 1.0 0.6  PROT 4.6* 5.1*  ALBUMIN 2.3* 2.5*   No results for input(s): LIPASE, AMYLASE in the last 168 hours. No results for input(s): AMMONIA in the last 168 hours. CBC:  Recent Labs Lab 02/05/17 0517 02/06/17 0533 02/11/17 0628  WBC 10.3 8.8 7.5  NEUTROABS 7.5  --   --   HGB 8.4* 9.3* 10.3*  HCT 26.3* 28.6* 31.7*  MCV 105.6* 103.6* 101.0*  PLT 283 316 495*   Cardiac Enzymes: No results for input(s): CKTOTAL, CKMB, CKMBINDEX, TROPONINI in the last 168 hours.  BNP (last 3 results) No results for input(s): BNP in the last 8760 hours.  ProBNP (last 3  results) No results for input(s): PROBNP in the last 8760 hours.   Serum creatinine: 1.09 mg/dL High 40/98/11 9147 Estimated creatinine clearance: 45.6 mL/min  CBG:  Recent Labs Lab 02/11/17 0649  GLUCAP 84    Radiological Exams on Admission: Dg Chest 2 View  Result Date: 02/11/2017 CLINICAL DATA:  Fall. EXAM: CHEST  2 VIEW COMPARISON:  02/04/2017 . FINDINGS: Mediastinum and hilar structures normal. Interim clearing of pulmonary edema. Mild left base subsegmental atelectasis . Cardiomegaly with normal pulmonary vascularity. No acute bony abnormality . IMPRESSION: 1. Cardiomegaly. Normal pulmonary vascularity. Interim clearing of pulmonary edema. 2. Mild left base subsegmental atelectasis. Small bilateral pleural effusions. Electronically Signed   By: Maisie Fus  Register   On: 02/11/2017 07:35   Dg Sacrum/coccyx  Result Date: 02/11/2017 CLINICAL DATA:  Fall. EXAM: SACRUM AND COCCYX -  2+ VIEW COMPARISON:  MRI 01/31/2017.  CT 01/30/2017 . FINDINGS: Slightly displaced fracture of the lower sacrum/ upper coccyx. Degenerative changes lumbar spine and both hips. Aortoiliac atherosclerotic vascular calcification IMPRESSION: Slightly displaced fracture of the lower sacrum/ upper coccyx . Electronically Signed   By: Maisie Fushomas  Register   On: 02/11/2017 07:37   Ct Head Wo Contrast  Result Date: 02/11/2017 CLINICAL DATA:  Lost her balance yesterday and fell, struck back of head, frontal headache, history hypertension, hyperlipidemia, smoker EXAM: CT HEAD WITHOUT CONTRAST CT CERVICAL SPINE WITHOUT CONTRAST TECHNIQUE: Multidetector CT imaging of the head and cervical spine was performed following the standard protocol without intravenous contrast. Multiplanar CT image reconstructions of the cervical spine were also generated. COMPARISON:  01/30/2017 FINDINGS: CT HEAD FINDINGS Brain: Mild generalized atrophy. Normal ventricular morphology. No midline shift or mass effect. Otherwise normal appearance of brain  parenchyma. No intracranial hemorrhage, mass lesion, or evidence of acute infarction. No extra-axial fluid collections. Vascular: Atherosclerotic calcifications at the carotid siphons Skull: Intact Sinuses/Orbits: Small amount of fluid and mucosal thickening in the LEFT maxillary sinus. Nasal septal deviation to the LEFT. Other: N/A CT CERVICAL SPINE FINDINGS Alignment: Normal on subluxation Skull base and vertebrae: Osseous mineralization. Facet degenerative changes LEFT C4-C5. No fracture or bone destruction. Visualized skullbase intact. Soft tissues and spinal canal: Atherosclerotic calcifications at the carotid bifurcations bilaterally, proximal great vessels and cranial aspect of aortic arch. Prevertebral soft tissues normal thickness. No other regional soft tissue abnormality identified. Disc levels: Multilevel disc space narrowing with endplate spur formation most pronounced at C5-C6 and C6-C7. Small uncovertebral spurs encroach upon the LEFT C5-C6 and BILATERAL C6-C7 neural foramina. Upper chest: 3 mm nodular density RIGHT apex unchanged. Several tiny additional LEFT upper lobe nodules noted. Other: Scattered beam hardening artifacts of dental origin. IMPRESSION: Generalized atrophy. No acute intracranial abnormalities. Multilevel degenerative disc and facet disease changes cervical spine. No acute cervical spine abnormalities. Tiny nonspecific nodular foci at lung apices. Electronically Signed   By: Ulyses SouthwardMark  Boles M.D.   On: 02/11/2017 08:20   Ct Cervical Spine Wo Contrast  Result Date: 02/11/2017 CLINICAL DATA:  Lost her balance yesterday and fell, struck back of head, frontal headache, history hypertension, hyperlipidemia, smoker EXAM: CT HEAD WITHOUT CONTRAST CT CERVICAL SPINE WITHOUT CONTRAST TECHNIQUE: Multidetector CT imaging of the head and cervical spine was performed following the standard protocol without intravenous contrast. Multiplanar CT image reconstructions of the cervical spine were also  generated. COMPARISON:  01/30/2017 FINDINGS: CT HEAD FINDINGS Brain: Mild generalized atrophy. Normal ventricular morphology. No midline shift or mass effect. Otherwise normal appearance of brain parenchyma. No intracranial hemorrhage, mass lesion, or evidence of acute infarction. No extra-axial fluid collections. Vascular: Atherosclerotic calcifications at the carotid siphons Skull: Intact Sinuses/Orbits: Small amount of fluid and mucosal thickening in the LEFT maxillary sinus. Nasal septal deviation to the LEFT. Other: N/A CT CERVICAL SPINE FINDINGS Alignment: Normal on subluxation Skull base and vertebrae: Osseous mineralization. Facet degenerative changes LEFT C4-C5. No fracture or bone destruction. Visualized skullbase intact. Soft tissues and spinal canal: Atherosclerotic calcifications at the carotid bifurcations bilaterally, proximal great vessels and cranial aspect of aortic arch. Prevertebral soft tissues normal thickness. No other regional soft tissue abnormality identified. Disc levels: Multilevel disc space narrowing with endplate spur formation most pronounced at C5-C6 and C6-C7. Small uncovertebral spurs encroach upon the LEFT C5-C6 and BILATERAL C6-C7 neural foramina. Upper chest: 3 mm nodular density RIGHT apex unchanged. Several tiny additional LEFT upper lobe nodules noted. Other: Scattered  beam hardening artifacts of dental origin. IMPRESSION: Generalized atrophy. No acute intracranial abnormalities. Multilevel degenerative disc and facet disease changes cervical spine. No acute cervical spine abnormalities. Tiny nonspecific nodular foci at lung apices. Electronically Signed   By: Ulyses Southward M.D.   On: 02/11/2017 08:20    EKG: Independently reviewed. Normal sinus rhythm. No STEMI. Normal QRS and PR interval.  Assessment/Plan Principal Problem:   Hypokalemia Active Problems:   Neuropathy (HCC)   Alcohol abuse   Frequent falls   Hypokalemia Replenishing IV and by mouth Recheck  level in morning Also checking magnesium phosphorus  Tailbone fracture Conservative mgmt  Neuropathy Holding gabapentin due to weakness recurrent falls  Alcohol abuse CIWA protocol PO vitamin supplementation  Frequent falls Physical therapy consult Hold Klonopin patient uses for anxiety  Smoker Nicotine patch  Code Status: FULL DVT Prophylaxis: SCD Family Communication: dgtr at bedside Disposition Plan: Pending Improvement  Status: obs tele   Haydee Salter, MD Family Medicine [facility] [email or web address] Password [email or web address]

## 2017-02-11 NOTE — ED Notes (Signed)
PT paged regarding PT consult

## 2017-02-11 NOTE — ED Triage Notes (Signed)
Pt arrived via EMS from home with family; [facility] and pancreatitis x 5 days; pt has been home for a week; Pt states she had 3 x falls since being back home; most recent fall on 02/09/17; pt c/o of tailbone pain from fall; pt states she has had weakness since discharge from [facility];  Pt states pain in tail bone is 7/10 on arrival. Pt a&ox 4 on arrival.  Pt states she has was discharge using a walker for ambulation at home but still falls with use of walker; Pt states she feels off balance;

## 2017-02-11 NOTE — ED Notes (Signed)
CHECKED CBG 83, RN MONIQUE INFORMED

## 2017-02-11 NOTE — Progress Notes (Signed)
Pt admitted to the unit at 1653. Pt mental status is A&O x 4. Pt oriented to room, staff, and call bell. Skin is intact except where otherwise charted. Full assessment charted in [facility]. Call bell within reach. Visitor guidelines reviewed w/ pt and/or family.

## 2017-02-11 NOTE — ED Notes (Signed)
Report attempted 

## 2017-02-11 NOTE — ED Notes (Signed)
ED Provider at bedside. 

## 2017-02-12 DIAGNOSIS — E876 Hypokalemia: Secondary | ICD-10-CM | POA: Diagnosis not present

## 2017-02-12 DIAGNOSIS — G629 Polyneuropathy, unspecified: Secondary | ICD-10-CM | POA: Diagnosis not present

## 2017-02-12 DIAGNOSIS — F1010 Alcohol abuse, uncomplicated: Secondary | ICD-10-CM

## 2017-02-12 DIAGNOSIS — R296 Repeated falls: Secondary | ICD-10-CM

## 2017-02-12 LAB — CBC
HCT: 27.8 % — ABNORMAL LOW (ref 36.0–46.0)
Hemoglobin: 8.9 g/dL — ABNORMAL LOW (ref 12.0–15.0)
MCH: 33 pg (ref 26.0–34.0)
MCHC: 32 g/dL (ref 30.0–36.0)
MCV: 103 fL — ABNORMAL HIGH (ref 78.0–100.0)
PLATELETS: 417 10*3/uL — AB (ref 150–400)
RBC: 2.7 MIL/uL — AB (ref 3.87–5.11)
RDW: 21.6 % — ABNORMAL HIGH (ref 11.5–15.5)
WBC: 6.7 10*3/uL (ref 4.0–10.5)

## 2017-02-12 LAB — URINE CULTURE

## 2017-02-12 LAB — COMPREHENSIVE METABOLIC PANEL
ALK PHOS: 109 U/L (ref 38–126)
ALT: 15 U/L (ref 14–54)
AST: 31 U/L (ref 15–41)
Albumin: 2.2 g/dL — ABNORMAL LOW (ref 3.5–5.0)
Anion gap: 7 (ref 5–15)
BUN: <5 mg/dL — ABNORMAL LOW (ref 6–20)
CALCIUM: 7.5 mg/dL — AB (ref 8.9–10.3)
CO2: 28 mmol/L (ref 22–32)
Chloride: 105 mmol/L (ref 101–111)
Creatinine, Ser: 0.86 mg/dL (ref 0.44–1.00)
Glucose, Bld: 101 mg/dL — ABNORMAL HIGH (ref 65–99)
Potassium: 4 mmol/L (ref 3.5–5.1)
Sodium: 140 mmol/L (ref 135–145)
Total Bilirubin: 0.8 mg/dL (ref 0.3–1.2)
Total Protein: 4.4 g/dL — ABNORMAL LOW (ref 6.5–8.1)

## 2017-02-12 MED ORDER — OXYCODONE-ACETAMINOPHEN 5-325 MG PO TABS: 1.0000 | ORAL_TABLET | Freq: Four times a day (QID) | ORAL | 0 refills | Status: DC | PRN

## 2017-02-12 NOTE — Care Management Obs Status (Signed)
MEDICARE OBSERVATION STATUS NOTIFICATION   Patient Details  Name: April PoissonDeborah S [name] MRN: [id number] Date of Birth: 01/19/1946   Medicare Observation Status Notification Given:  Yes    Epifanio LeschesCole, Searra Carnathan Hudson, RN 02/12/2017, 1:12 PM

## 2017-02-12 NOTE — Discharge Summary (Addendum)
Physician Discharge Summary  April [name]:[id number] DOB: 1946-01-21 DOA: 02/11/2017  PCP: Lilia Argue  Admit date: 02/11/2017 Discharge date: 02/12/2017  Admitted From: Home Disposition:  Home  Recommendations for Outpatient Follow-up:  1. Follow up with PCP in 1-2 weeks 2. ETOH cessation done face to face. Patient is agreeable to outpatient alcohol program on discharge  NCCSR reviewed. Last refill of clonazepam was dispensed 12/27/16 x 30 day supply. No controlled pain medications listed. Given acute fracture, will give limited supply of percocet  Home Health:PT   Discharge Condition:Stable CODE STATUS:Full Diet recommendation: heart healthy   Brief/Interim Summary: 71 y.o. female  with past medical history significant for alcohol abuse, high blood pressure at hospital discharge on February 26 after episode of pancreatitis and gallbladder removal. Patient presented for repeated falls. Patient daughter had conservative possible tailbone fracture. Patient's last fall was on the 27th. She has been ambulating with a walker since. She has had no issues except for pain. Patient has continued to smoke and drink brandy daily. Patient has not been confused. Denies hitting her head. Other than pain in her bottom patient denies any other symptoms.  ED course: Negative urinalysis. Negative CT head and C-spine. Potassium was found to be critically low. Hospitalist was consulted for admission.  Hypokalemia Replaced Mg noted to be low, was replaced  Tailbone fracture Conservative mgmt  Neuropathy Held gabapentin this admission due to weakness recurrent falls  Alcohol abuse CIWA protocol initiated No evidence of withdrawals this admit PO vitamin supplementation  Frequent falls Physical therapy consulted with recs for home health PT Held Klonopin patient uses for anxiety  Smoker Nicotine patch this admission  Discharge Diagnoses:  Principal Problem:    Hypokalemia Active Problems:   Neuropathy (HCC)   Alcohol abuse   Frequent falls    Discharge Instructions   Allergies as of 02/12/2017      Reactions   Naltrexone Shortness Of Breath   Penicillins Rash   Has patient had a PCN reaction causing immediate rash, facial/tongue/throat swelling, SOB or lightheadedness with hypotension: Yes immediate rash  Has patient had a PCN reaction causing severe rash involving mucus membranes or skin necrosis: Yes Has patient had a PCN reaction that required hospitalization": No Has patient had a PCN reaction occurring within the last 10 years: Yes If all of the above answers are "NO", then may proceed with Cephalosporin use.      Medication List    STOP taking these medications   GOODY HEADACHE PO     TAKE these medications   amitriptyline 25 MG tablet Commonly known as:  ELAVIL Take 25 mg by mouth at bedtime as needed for sleep.   clonazePAM 0.5 MG tablet Commonly known as:  KLONOPIN Take 0.5 mg by mouth 2 (two) times daily as needed for anxiety.   cyanocobalamin 1000 MCG tablet Take 1 tablet (1,000 mcg total) by mouth daily.   gabapentin 300 MG capsule Commonly known as:  NEURONTIN Take 2 capsules (600 mg total) by mouth 3 (three) times daily.   ibuprofen 200 MG tablet Commonly known as:  ADVIL,MOTRIN Take 400 mg by mouth every 6 (six) hours as needed for moderate pain.   oxyCODONE-acetaminophen 5-325 MG tablet Commonly known as:  ROXICET Take 1 tablet by mouth every 6 (six) hours as needed for severe pain.   Vitamin D (Ergocalciferol) 50000 units Caps capsule Commonly known as:  DRISDOL Take 50,000 Units by mouth once a week.      Follow-up Information  KAPLAN,KRISTEN, PA-C. Schedule an appointment as soon as possible for a visit in 1 week(s).   Specialty:  Family Medicine Contact information: [address] [place] [address] [phone]          Allergies  Allergen Reactions  . Naltrexone Shortness  Of Breath  . Penicillins Rash    Has patient had a PCN reaction causing immediate rash, facial/tongue/throat swelling, SOB or lightheadedness with hypotension: Yes immediate rash  Has patient had a PCN reaction causing severe rash involving mucus membranes or skin necrosis: Yes Has patient had a PCN reaction that required hospitalization": No Has patient had a PCN reaction occurring within the last 10 years: Yes If all of the above answers are "NO", then may proceed with Cephalosporin use.     Procedures/Studies: Dg Chest 2 View  Result Date: 02/11/2017 CLINICAL DATA:  Fall. EXAM: CHEST  2 VIEW COMPARISON:  02/04/2017 . FINDINGS: Mediastinum and hilar structures normal. Interim clearing of pulmonary edema. Mild left base subsegmental atelectasis . Cardiomegaly with normal pulmonary vascularity. No acute bony abnormality . IMPRESSION: 1. Cardiomegaly. Normal pulmonary vascularity. Interim clearing of pulmonary edema. 2. Mild left base subsegmental atelectasis. Small bilateral pleural effusions. Electronically Signed   By: Maisie Fus  Register   On: 02/11/2017 07:35   Dg Chest 2 View  Result Date: 02/04/2017 CLINICAL DATA:  Post laparoscopic cholecystectomy yesterday, hypoxemia, postoperative fever, hypertension EXAM: CHEST  2 VIEW COMPARISON:  None FINDINGS: Upper normal heart size. Atherosclerotic calcification aorta. BILATERAL perihilar infiltrates and basilar pleural effusions. Probable components compressive atelectasis in the lower lobes. No pneumothorax. Bones unremarkable. IMPRESSION: Bibasilar pleural effusions and atelectasis with suspected mild perihilar infiltrates which could represent edema or infection. Electronically Signed   By: Ulyses Southward M.D.   On: 02/04/2017 18:01   Dg Sacrum/coccyx  Result Date: 02/11/2017 CLINICAL DATA:  Fall. EXAM: SACRUM AND COCCYX - 2+ VIEW COMPARISON:  MRI 01/31/2017.  CT 01/30/2017 . FINDINGS: Slightly displaced fracture of the lower sacrum/ upper coccyx.  Degenerative changes lumbar spine and both hips. Aortoiliac atherosclerotic vascular calcification IMPRESSION: Slightly displaced fracture of the lower sacrum/ upper coccyx . Electronically Signed   By: Maisie Fus  Register   On: 02/11/2017 07:37   Dg Cholangiogram Operative  Result Date: 02/03/2017 CLINICAL DATA:  71 year old female with history of prior ERCP and choledocholithiasis EXAM: INTRAOPERATIVE CHOLANGIOGRAM TECHNIQUE: Cholangiographic images from the C-arm fluoroscopic device were submitted for interpretation post-operatively. Please see the procedural report for the amount of contrast and the fluoroscopy time utilized. COMPARISON:  ERCP 02/01/2017, CT 01/30/2017 FINDINGS: Surgical instruments project over the upper abdomen. There is cannulation of the cystic duct/gallbladder neck, with antegrade infusion of contrast. Caliber of the extrahepatic ductal system dilated Filling defects within the common bile duct. Contrast does not cross the ampulla on the current study. IMPRESSION: Intraoperative cholangiogram demonstrates extrahepatic biliary ducts mildly dilated with filling defects in the common bile duct compatible with stones/debris. Note that contrast does not traverse the ampulla on the current study and persisting obstruction not excluded. Please refer to the dictated operative report for full details of intraoperative findings and procedure Signed, Yvone Neu. Loreta Ave, DO [facility] Electronically Signed   By: Gilmer Mor D.O.   On: 02/03/2017 16:04   Ct Head Wo Contrast  Result Date: 02/11/2017 CLINICAL DATA:  Lost her balance yesterday and fell, struck back of head, frontal headache, history hypertension, hyperlipidemia, smoker EXAM: CT HEAD WITHOUT CONTRAST CT CERVICAL SPINE WITHOUT CONTRAST TECHNIQUE: Multidetector  CT imaging of the head and cervical spine was performed following the standard protocol without intravenous contrast.  Multiplanar CT image reconstructions of the cervical spine were also generated. COMPARISON:  01/30/2017 FINDINGS: CT HEAD FINDINGS Brain: Mild generalized atrophy. Normal ventricular morphology. No midline shift or mass effect. Otherwise normal appearance of brain parenchyma. No intracranial hemorrhage, mass lesion, or evidence of acute infarction. No extra-axial fluid collections. Vascular: Atherosclerotic calcifications at the carotid siphons Skull: Intact Sinuses/Orbits: Small amount of fluid and mucosal thickening in the LEFT maxillary sinus. Nasal septal deviation to the LEFT. Other: N/A CT CERVICAL SPINE FINDINGS Alignment: Normal on subluxation Skull base and vertebrae: Osseous mineralization. Facet degenerative changes LEFT C4-C5. No fracture or bone destruction. Visualized skullbase intact. Soft tissues and spinal canal: Atherosclerotic calcifications at the carotid bifurcations bilaterally, proximal great vessels and cranial aspect of aortic arch. Prevertebral soft tissues normal thickness. No other regional soft tissue abnormality identified. Disc levels: Multilevel disc space narrowing with endplate spur formation most pronounced at C5-C6 and C6-C7. Small uncovertebral spurs encroach upon the LEFT C5-C6 and BILATERAL C6-C7 neural foramina. Upper chest: 3 mm nodular density RIGHT apex unchanged. Several tiny additional LEFT upper lobe nodules noted. Other: Scattered beam hardening artifacts of dental origin. IMPRESSION: Generalized atrophy. No acute intracranial abnormalities. Multilevel degenerative disc and facet disease changes cervical spine. No acute cervical spine abnormalities. Tiny nonspecific nodular foci at lung apices. Electronically Signed   By: Ulyses Southward M.D.   On: 02/11/2017 08:20   Ct Head Wo Contrast  Result Date: 01/30/2017 CLINICAL DATA:  Initial evaluation for acute trauma, fall. EXAM: CT HEAD WITHOUT CONTRAST CT CERVICAL SPINE WITHOUT CONTRAST TECHNIQUE: Multidetector CT imaging  of the head and cervical spine was performed following the standard protocol without intravenous contrast. Multiplanar CT image reconstructions of the cervical spine were also generated. COMPARISON:  Prior CT from 02/24/2016. FINDINGS: CT HEAD FINDINGS Brain: Generalized age-related cerebral atrophy with mild to moderate chronic microvascular ischemic disease. No acute intracranial hemorrhage. No evidence for acute large vessel territory infarct. No mass lesion, midline shift or mass effect. No hydrocephalus. No extra-axial fluid collection. Vascular: No hyperdense vessel. Scattered vascular calcifications noted within the carotid siphons. Skull: Scalp soft tissues demonstrate no acute abnormality. Calvarium intact. Sinuses/Orbits: Globes and orbital soft tissues within normal limits. Small fluid level noted within the left maxillary sinus. Visualized paranasal sinuses are otherwise largely clear. No mastoid effusion. Degenerative changes noted about the temporomandibular joints bilaterally. CT CERVICAL SPINE FINDINGS Alignment: Straightening of the normal cervical lordosis. No listhesis. Skull base and vertebrae: The skullbase intact. Normal C1-2 articulations are preserved. Dens intact. Vertebral body heights are maintained. The bone no acute fracture. Soft tissues and spinal canal: The visualized soft tissues of the neck demonstrate no acute abnormality. No prevertebral edema. Prominent vascular calcifications present about the carotid bifurcations. Disc levels: Moderate degenerative spondylolysis present at C5-6 and C6-7. The the the prominent left-sided facet arthrosis at C3-4. Upper chest: Visualized upper mediastinum unremarkable. Emphysematous changes noted within the visualized lung apices. IMPRESSION: CT BRAIN: 1. No acute intracranial process identified. 2. Generalized age-related cerebral atrophy with mild chronic small vessel ischemic disease. 3. Small fluid level within the left maxillary sinus,  suggesting acute sinusitis. CT CERVICAL SPINE: 1. No acute traumatic injury within the cervical spine. 2. Moderate degenerative spondylolysis at C5-6 and C6-7. 3. Emphysema. Electronically Signed   By: Rise Mu M.D.   On: 01/30/2017 00:52   Ct Cervical Spine Wo Contrast  Result Date: 02/11/2017  CLINICAL DATA:  Lost her balance yesterday and fell, struck back of head, frontal headache, history hypertension, hyperlipidemia, smoker EXAM: CT HEAD WITHOUT CONTRAST CT CERVICAL SPINE WITHOUT CONTRAST TECHNIQUE: Multidetector CT imaging of the head and cervical spine was performed following the standard protocol without intravenous contrast. Multiplanar CT image reconstructions of the cervical spine were also generated. COMPARISON:  01/30/2017 FINDINGS: CT HEAD FINDINGS Brain: Mild generalized atrophy. Normal ventricular morphology. No midline shift or mass effect. Otherwise normal appearance of brain parenchyma. No intracranial hemorrhage, mass lesion, or evidence of acute infarction. No extra-axial fluid collections. Vascular: Atherosclerotic calcifications at the carotid siphons Skull: Intact Sinuses/Orbits: Small amount of fluid and mucosal thickening in the LEFT maxillary sinus. Nasal septal deviation to the LEFT. Other: N/A CT CERVICAL SPINE FINDINGS Alignment: Normal on subluxation Skull base and vertebrae: Osseous mineralization. Facet degenerative changes LEFT C4-C5. No fracture or bone destruction. Visualized skullbase intact. Soft tissues and spinal canal: Atherosclerotic calcifications at the carotid bifurcations bilaterally, proximal great vessels and cranial aspect of aortic arch. Prevertebral soft tissues normal thickness. No other regional soft tissue abnormality identified. Disc levels: Multilevel disc space narrowing with endplate spur formation most pronounced at C5-C6 and C6-C7. Small uncovertebral spurs encroach upon the LEFT C5-C6 and BILATERAL C6-C7 neural foramina. Upper chest: 3 mm  nodular density RIGHT apex unchanged. Several tiny additional LEFT upper lobe nodules noted. Other: Scattered beam hardening artifacts of dental origin. IMPRESSION: Generalized atrophy. No acute intracranial abnormalities. Multilevel degenerative disc and facet disease changes cervical spine. No acute cervical spine abnormalities. Tiny nonspecific nodular foci at lung apices. Electronically Signed   By: Ulyses SouthwardMark  Boles M.D.   On: 02/11/2017 08:20   Ct Cervical Spine Wo Contrast  Result Date: 01/30/2017 CLINICAL DATA:  Initial evaluation for acute trauma, fall. EXAM: CT HEAD WITHOUT CONTRAST CT CERVICAL SPINE WITHOUT CONTRAST TECHNIQUE: Multidetector CT imaging of the head and cervical spine was performed following the standard protocol without intravenous contrast. Multiplanar CT image reconstructions of the cervical spine were also generated. COMPARISON:  Prior CT from 02/24/2016. FINDINGS: CT HEAD FINDINGS Brain: Generalized age-related cerebral atrophy with mild to moderate chronic microvascular ischemic disease. No acute intracranial hemorrhage. No evidence for acute large vessel territory infarct. No mass lesion, midline shift or mass effect. No hydrocephalus. No extra-axial fluid collection. Vascular: No hyperdense vessel. Scattered vascular calcifications noted within the carotid siphons. Skull: Scalp soft tissues demonstrate no acute abnormality. Calvarium intact. Sinuses/Orbits: Globes and orbital soft tissues within normal limits. Small fluid level noted within the left maxillary sinus. Visualized paranasal sinuses are otherwise largely clear. No mastoid effusion. Degenerative changes noted about the temporomandibular joints bilaterally. CT CERVICAL SPINE FINDINGS Alignment: Straightening of the normal cervical lordosis. No listhesis. Skull base and vertebrae: The skullbase intact. Normal C1-2 articulations are preserved. Dens intact. Vertebral body heights are maintained. The bone no acute fracture. Soft  tissues and spinal canal: The visualized soft tissues of the neck demonstrate no acute abnormality. No prevertebral edema. Prominent vascular calcifications present about the carotid bifurcations. Disc levels: Moderate degenerative spondylolysis present at C5-6 and C6-7. The the the prominent left-sided facet arthrosis at C3-4. Upper chest: Visualized upper mediastinum unremarkable. Emphysematous changes noted within the visualized lung apices. IMPRESSION: CT BRAIN: 1. No acute intracranial process identified. 2. Generalized age-related cerebral atrophy with mild chronic small vessel ischemic disease. 3. Small fluid level within the left maxillary sinus, suggesting acute sinusitis. CT CERVICAL SPINE: 1. No acute traumatic injury within the cervical spine. 2. Moderate degenerative spondylolysis  at C5-6 and C6-7. 3. Emphysema. Electronically Signed   By: Rise Mu M.D.   On: 01/30/2017 00:52   Ct Abdomen Pelvis W Contrast  Result Date: 01/30/2017 CLINICAL DATA:  Initial evaluation for acute nausea, vomiting, diarrhea. EXAM: CT ABDOMEN AND PELVIS WITH CONTRAST TECHNIQUE: Multidetector CT imaging of the abdomen and pelvis was performed using the standard protocol following bolus administration of intravenous contrast. CONTRAST:  ISOVUE-300 IOPAMIDOL (ISOVUE-300) INJECTION 61% COMPARISON:  None. FINDINGS: Lower chest: Scattered atelectatic changes present within the visualized lung bases. Visualized lungs are otherwise clear. Hepatobiliary: Diffuse hypoattenuation liver consistent with steatosis. Liver otherwise unremarkable. Gallbladder largely contracted. Calcification within the gallbladder with likely reflects a stone. There is mild mucosal enhancement about the gallbladder itself. Common bile duct is dilated up to 14 mm. There is question of possible intraluminal density within the distal common bile duct (series 2, image 34), which may reflect choledocholithiasis. Pancreas: Hazy inflammatory  stranding about the pancreas, most prevalent about the pancreatic head and uncinate process, with additional stranding near the pancreatic tail. Findings suggestive of possible acute pancreatitis. This may be related to gallstones given the findings within the gallbladder and common bile duct. No loculated fluid collection. No findings to suggest pancreatic necrosis. Spleen: Spleen within normal limits. Adrenals/Urinary Tract: 11 mm nodule at the medial limb of the left adrenal gland, indeterminate. Adrenal glands otherwise unremarkable. Kidneys equal size with symmetric enhancement. No nephrolithiasis, hydronephrosis, or focal enhancing renal mass. No hydroureter. Bladder within normal limits. Stomach/Bowel: Stomach within normal limits. Mild hazy stranding about the duodenum, felt to be secondary due to the inflammatory changes about the adjacent pancreas. No evidence for bowel obstruction. Colonic diverticulosis without evidence for acute diverticulitis. Intraluminal fluid density with mild mucosal enhancement noted about the ascending colon, suspected to be secondary in nature due to the inflammatory changes within the upper abdomen. No other significant inflammatory changes about the bowels. No findings to suggest acute appendicitis. Vascular/Lymphatic: Advanced aorto bi-iliac atherosclerotic disease. No aneurysm. No adenopathy. Reproductive: Probable small fibroid noted within the uterus. Uterus otherwise unremarkable. Ovaries within normal limits. Other: No free intraperitoneal air. Small volume free fluid within the upper abdomen. Musculoskeletal: No acute osseous abnormality. No worrisome lytic or blastic osseous lesions. IMPRESSION: 1. Hazy inflammatory stranding with free fluid about the pancreas, suggesting acute pancreatitis. Correlation with serum lipase recommended. 2. Cholelithiasis with dilatation of the common bile duct up to 14 mm. Question intraluminal fluid density within the distal common bile  duct, raising the possibility for possible choledocholithiasis. This is suspected to reflect the underlying etiology for acute pancreatitis. Gallbladder itself also appears somewhat inflamed with associated mucosal enhancement. This could be further assessed with dedicated right upper quadrant ultrasound. 3. Colonic diverticulosis without evidence for acute diverticulitis. 4. Fibroid uterus. 5. Severe hepatic steatosis. 6. Advanced aorto bi-iliac atherosclerotic disease. 7. 11 mm left adrenal nodule, indeterminate. Further evaluation with dedicated adrenal mass protocol CT and/or MRI suggested. This could be performed on a nonemergent basis. Electronically Signed   By: Rise Mu M.D.   On: 01/30/2017 01:10   Mr 3d Recon At Scanner  Result Date: 01/31/2017 CLINICAL DATA:  71 year old female with past medical history of alcohol abuse presenting with upper abdominal pain for the past 5 days. Possible choledocholithiasis noted on recent CT the abdomen and pelvis which also demonstrated probable pancreatitis. Followup evaluation. EXAM: MRI ABDOMEN WITHOUT AND WITH CONTRAST (INCLUDING MRCP) TECHNIQUE: Multiplanar multisequence MR imaging of the abdomen was performed both before and after  the administration of intravenous contrast. Heavily T2-weighted images of the biliary and pancreatic ducts were obtained, and three-dimensional MRCP images were rendered by post processing. CONTRAST:  11mL MULTIHANCE GADOBENATE DIMEGLUMINE 529 MG/ML IV SOLN COMPARISON:  No prior abdominal MRI. CT the abdomen and pelvis 01/30/2017. FINDINGS: Comment: Study is significantly limited by a large amount of patient respiratory motion. Lower chest: Small bilateral pleural effusions lying dependently. Hepatobiliary: Diffuse loss of signal intensity throughout the hepatic parenchyma on out of phase dual echo images, compatible with hepatic steatosis. No discrete cystic or solid hepatic lesions are identified. MRCP images demonstrate  mild intra and extrahepatic biliary ductal dilatation. There are several small filling defects within the gallbladder, compatible with gallstones. No findings to suggest acute cholecystitis at this time. Common bile duct measures up to 12 mm in the porta hepatis, and there are several small filling defects within the distal aspect of the common bile duct, compatible with choledocholithiasis. The largest of these measure up to 6 mm in diameter (image 15 of series 5). Pancreas: No pancreatic mass. No pancreatic ductal dilatation on MRCP images. Small amount of peripancreatic fluid most evident adjacent to the distal body and tail of the pancreas. Spleen:  Unremarkable. Adrenals/Urinary Tract: 1 cm nodule in the medial limb of the left adrenal gland demonstrates loss of signal intensity on out of phase dual echo images, compatible with an adenoma. Right adrenal gland and bilateral kidneys are normal in appearance. No hydroureteronephrosis in the visualized portions of the abdomen. Stomach/Bowel: Visualized portions are unremarkable. Vascular/Lymphatic: No aneurysm identified in the abdominal vasculature. No lymphadenopathy noted in the abdomen. Other: Trace amount of fluid adjacent to the distal body and tail of the pancreas. Trace amount of perihepatic ascites. Musculoskeletal: No aggressive osseous lesions are identified in the visualized portions of the skeleton. IMPRESSION: 1. Study is positive for choledocholithiasis with mild intra and extrahepatic biliary ductal dilatation, indicative of obstruction. 2. Cholelithiasis without evidence of acute cholecystitis at this time. 3. Inflammatory changes adjacent to the distal body and tail of the pancreas, suggestive of an acute pancreatitis. 4. Small 1 cm left adrenal adenoma incidentally noted. 5. Trace volume of ascites. Electronically Signed   By: Trudie Reed M.D.   On: 01/31/2017 16:58   Dg Ercp  Result Date: 02/01/2017 CLINICAL DATA:  Choledocholithiasis  with biliary dilatation but MRCP. EXAM: ERCP TECHNIQUE: Multiple spot images obtained with the fluoroscopic device and submitted for interpretation post-procedure. FLUOROSCOPY TIME:  Fluoroscopy Time:  4.2 minutes COMPARISON:  01/31/2017 FINDINGS: Limited retrograde cholangiogram demonstrates mild diffuse biliary dilatation. Distal aspect of the CBD is not well opacified. Distal CBD stricture or obstruction not excluded. Please refer to the ERCP report. IMPRESSION: Limited intraoperative cholangiogram demonstrates mild biliary dilatation diffusely. Limited assessment of the distal CBD. Difficult to exclude obstruction, stricture, or choledocholithiasis. These images were submitted for radiologic interpretation only. Please see the procedural report for the amount of contrast and the fluoroscopy time utilized. Electronically Signed   By: Judie Petit.  Shick M.D.   On: 02/01/2017 16:32   Mr Abdomen Mrcp April [name] Contast  Result Date: 01/31/2017 CLINICAL DATA:  71 year old female with past medical history of alcohol abuse presenting with upper abdominal pain for the past 5 days. Possible choledocholithiasis noted on recent CT the abdomen and pelvis which also demonstrated probable pancreatitis. Followup evaluation. EXAM: MRI ABDOMEN WITHOUT AND WITH CONTRAST (INCLUDING MRCP) TECHNIQUE: Multiplanar multisequence MR imaging of the abdomen was performed both before and after the administration of intravenous contrast. Heavily  T2-weighted images of the biliary and pancreatic ducts were obtained, and three-dimensional MRCP images were rendered by post processing. CONTRAST:  11mL MULTIHANCE GADOBENATE DIMEGLUMINE 529 MG/ML IV SOLN COMPARISON:  No prior abdominal MRI. CT the abdomen and pelvis 01/30/2017. FINDINGS: Comment: Study is significantly limited by a large amount of patient respiratory motion. Lower chest: Small bilateral pleural effusions lying dependently. Hepatobiliary: Diffuse loss of signal intensity throughout the  hepatic parenchyma on out of phase dual echo images, compatible with hepatic steatosis. No discrete cystic or solid hepatic lesions are identified. MRCP images demonstrate mild intra and extrahepatic biliary ductal dilatation. There are several small filling defects within the gallbladder, compatible with gallstones. No findings to suggest acute cholecystitis at this time. Common bile duct measures up to 12 mm in the porta hepatis, and there are several small filling defects within the distal aspect of the common bile duct, compatible with choledocholithiasis. The largest of these measure up to 6 mm in diameter (image 15 of series 5). Pancreas: No pancreatic mass. No pancreatic ductal dilatation on MRCP images. Small amount of peripancreatic fluid most evident adjacent to the distal body and tail of the pancreas. Spleen:  Unremarkable. Adrenals/Urinary Tract: 1 cm nodule in the medial limb of the left adrenal gland demonstrates loss of signal intensity on out of phase dual echo images, compatible with an adenoma. Right adrenal gland and bilateral kidneys are normal in appearance. No hydroureteronephrosis in the visualized portions of the abdomen. Stomach/Bowel: Visualized portions are unremarkable. Vascular/Lymphatic: No aneurysm identified in the abdominal vasculature. No lymphadenopathy noted in the abdomen. Other: Trace amount of fluid adjacent to the distal body and tail of the pancreas. Trace amount of perihepatic ascites. Musculoskeletal: No aggressive osseous lesions are identified in the visualized portions of the skeleton. IMPRESSION: 1. Study is positive for choledocholithiasis with mild intra and extrahepatic biliary ductal dilatation, indicative of obstruction. 2. Cholelithiasis without evidence of acute cholecystitis at this time. 3. Inflammatory changes adjacent to the distal body and tail of the pancreas, suggestive of an acute pancreatitis. 4. Small 1 cm left adrenal adenoma incidentally noted. 5.  Trace volume of ascites. Electronically Signed   By: Trudie Reed M.D.   On: 01/31/2017 16:58   US Abdomen Limited Ruq  Result Date: 01/30/2017 CLINICAL DATA:  Cholelithiasis, abnormal CT, pain, vomiting EXAM: US ABDOMEN LIMITED - RIGHT UPPER QUADRANT COMPARISON:  CT abdomen/pelvis 01/30/2017 FINDINGS: Gallbladder: Shadowing gallstones in gallbladder up to 10 mm diameter. Gallbladder contracted. Wall suboptimally imaged. Mild tenderness in the region of the gallbladder with imaging. No pericholecystic fluid. Common bile duct: Diameter: Dilated 8 mm diameter at the porta hepatis, dilating to 14 mm more distally. Distal CBD is not imaged ; the questioned distal common duct stones identified on CT are unable to be assessed sonographically due to bowel gas. Liver: Echogenic, secondary to fatty infiltration as seen on prior CT. Hepatopetal portal venous flow. No definite hepatic mass. No RIGHT upper quadrant free fluid. IMPRESSION: Contracted gallbladder with multiple gallstones. Fatty infiltration of liver. Extrahepatic biliary dilatation, though the distal CBD and potential choledocholithiasis identified on CT are obscured by bowel gas ; if clinically indicated these could be better assessed by MRCP imaging. Electronically Signed   By: Ulyses Southward M.D.   On: 01/30/2017 13:55    Subjective: No complaints  Discharge Exam: Vitals:   02/12/17 0607 02/12/17 1208  BP: 113/61 123/76  Pulse: 81 83  Resp: 18 20  Temp: 98 F (36.7 C) 97.5 F (36.4 C)  Vitals:   02/11/17 1717 02/12/17 0225 02/12/17 0607 02/12/17 1208  BP: 116/67  113/61 123/76  Pulse: 81  81 83  Resp: 16  18 20   Temp: 98.7 F (37.1 C)  98 F (36.7 C) 97.5 F (36.4 C)  TempSrc: Oral  Oral Oral  SpO2: 100%  98% 95%  Weight:  57.6 kg (126 lb 14.4 oz)      General: Pt is alert, awake, not in acute distress Cardiovascular: RRR, S1/S2 +, no rubs, no gallops Respiratory: CTA bilaterally, no wheezing, no rhonchi Abdominal: Soft,  NT, ND, bowel sounds + Extremities: no edema, no cyanosis   The results of significant diagnostics from this hospitalization (including imaging, microbiology, ancillary and laboratory) are listed below for reference.     Microbiology: Recent Results (from the past 240 hour(s))  Surgical pcr screen     Status: None   Collection Time: 02/03/17 10:32 AM  Result Value Ref Range Status   MRSA, PCR NEGATIVE NEGATIVE Final   Staphylococcus aureus NEGATIVE NEGATIVE Final    Comment:        The Xpert SA Assay (FDA approved for NASAL specimens in patients over 16 years of age), is one component of a comprehensive surveillance program.  Test performance has been validated by [facility] for patients greater than or equal to 4 year old. It is not intended to diagnose infection nor to guide or monitor treatment.   Urine culture     Status: Abnormal   Collection Time: 02/11/17  6:51 AM  Result Value Ref Range Status   Specimen Description URINE, RANDOM  Final   Special Requests NONE  Final   Culture (A)  Final    50,000 COLONIES/mL LACTOBACILLUS SPECIES Standardized susceptibility testing for this organism is not available.    Report Status 02/12/2017 FINAL  Final     Labs: BNP (last 3 results) No results for input(s): BNP in the last 8760 hours. Basic Metabolic Panel:  Recent Labs Lab 02/06/17 0533 02/11/17 0628 02/11/17 0929 02/12/17 0535  NA 137 139  --  140  K 2.8* 2.4*  --  4.0  CL 101 99*  --  105  CO2 28 28  --  28  GLUCOSE 127* 89  --  101*  BUN 9 <5*  --  <5*  CREATININE 0.89 1.09*  --  0.86  CALCIUM 8.1* 8.2*  --  7.5*  MG  --   --  1.4*  --   PHOS  --   --  4.9*  --    Liver Function Tests:  Recent Labs Lab 02/06/17 0533 02/12/17 0535  AST 36 31  ALT 30 15  ALKPHOS 112 109  BILITOT 0.6 0.8  PROT 5.1* 4.4*  ALBUMIN 2.5* 2.2*   No results for input(s): LIPASE, AMYLASE in the last 168 hours. No results for input(s): AMMONIA in the last 168  hours. CBC:  Recent Labs Lab 02/06/17 0533 02/11/17 0628 02/12/17 0535  WBC 8.8 7.5 6.7  HGB 9.3* 10.3* 8.9*  HCT 28.6* 31.7* 27.8*  MCV 103.6* 101.0* 103.0*  PLT 316 495* 417*   Cardiac Enzymes: No results for input(s): CKTOTAL, CKMB, CKMBINDEX, TROPONINI in the last 168 hours. BNP: Invalid input(s): POCBNP CBG:  Recent Labs Lab 02/11/17 0649  GLUCAP 84   D-Dimer No results for input(s): DDIMER in the last 72 hours. Hgb A1c No results for input(s): HGBA1C in the last 72 hours. Lipid Profile No results for input(s): CHOL, HDL, LDLCALC, TRIG, CHOLHDL,  LDLDIRECT in the last 72 hours. Thyroid function studies No results for input(s): TSH, T4TOTAL, T3FREE, THYROIDAB in the last 72 hours.  Invalid input(s): FREET3 Anemia work up No results for input(s): VITAMINB12, FOLATE, FERRITIN, TIBC, IRON, RETICCTPCT in the last 72 hours. Urinalysis    Component Value Date/Time   COLORURINE STRAW (A) 02/11/2017 0703   APPEARANCEUR CLEAR 02/11/2017 0703   LABSPEC 1.004 (L) 02/11/2017 0703   PHURINE 5.0 02/11/2017 0703   GLUCOSEU NEGATIVE 02/11/2017 0703   HGBUR NEGATIVE 02/11/2017 0703   BILIRUBINUR NEGATIVE 02/11/2017 0703   KETONESUR NEGATIVE 02/11/2017 0703   PROTEINUR NEGATIVE 02/11/2017 0703   NITRITE NEGATIVE 02/11/2017 0703   LEUKOCYTESUR NEGATIVE 02/11/2017 0703   Sepsis Labs Invalid input(s): PROCALCITONIN,  WBC,  LACTICIDVEN Microbiology Recent Results (from the past 240 hour(s))  Surgical pcr screen     Status: None   Collection Time: 02/03/17 10:32 AM  Result Value Ref Range Status   MRSA, PCR NEGATIVE NEGATIVE Final   Staphylococcus aureus NEGATIVE NEGATIVE Final    Comment:        The Xpert SA Assay (FDA approved for NASAL specimens in patients over 8 years of age), is one component of a comprehensive surveillance program.  Test performance has been validated by [facility] for patients greater than or equal to 59 year old. It is not intended to  diagnose infection nor to guide or monitor treatment.   Urine culture     Status: Abnormal   Collection Time: 02/11/17  6:51 AM  Result Value Ref Range Status   Specimen Description URINE, RANDOM  Final   Special Requests NONE  Final   Culture (A)  Final    50,000 COLONIES/mL LACTOBACILLUS SPECIES Standardized susceptibility testing for this organism is not available.    Report Status 02/12/2017 FINAL  Final     SIGNED:   Jerald Kief, MD  [facility] 02/12/2017, 12:59 PM  If 7PM-7AM, please contact night-coverage [email or web address] Password [email or web address]

## 2017-02-12 NOTE — Evaluation (Signed)
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  PMHx: smoker, ETOH   Clinical Impression  Pt is demonstrating some awareness of how to move, but is not able to try alone.  Check of O2 sat reveals no differences between no O2 and using O2, steady from 95-97% with gait.  Her plan is to follow acutely and will work on endurance with no O2, strengthening and safety awareness, which might also need to include her daughter.    Follow Up Recommendations Home health PT;Supervision/Assistance - 24 hour    Equipment Recommendations  None recommended by PT (has RW)    Recommendations for Other Services       Precautions / Restrictions Precautions Precautions: Fall;Back (telemetry) Precaution Booklet Issued: No Precaution Comments: verbally reviewed precautions Restrictions Weight Bearing Restrictions: No      Mobility  Bed Mobility Overal bed mobility: Needs Assistance Bed Mobility: Sidelying to Sit;Sit to Sidelying   Sidelying to sit: Min assist     Sit to sidelying: Min assist General bed mobility comments: trunk to sit up and legs to return to bed  Transfers Overall transfer level: Needs assistance Equipment used: Rolling walker (2 wheeled) Transfers: Sit to/from [employer] Sit to Stand: Min assist Stand pivot transfers: Min assist       General transfer comment: able to steady herself with RW and min with PT  Ambulation/Gait Ambulation/Gait assistance: Min assist;Min guard Ambulation Distance (Feet): 40 Feet (x 2) Assistive device: Rolling walker (2 wheeled);1 person hand held assist Gait Pattern/deviations: Step-through pattern;Decreased stride length;Narrow base of support;Trunk flexed;Shuffle  (stiff ankles) Gait velocity: reduced Gait velocity interpretation: Below normal speed for age/gender General Gait Details: noted changes that might be related to RLE strength difference  Stairs            Wheelchair Mobility    Modified Rankin (Stroke Patients Only)       Balance Overall balance assessment: Needs assistance Sitting-balance support: Feet supported Sitting balance-Leahy Scale: Fair   Postural control: Posterior lean Standing balance support: Bilateral upper extremity supported Standing balance-Leahy Scale: Poor Standing balance comment: RW corrects to fair                             Pertinent Vitals/Pain Pain Assessment: 0-10 Pain Score: 5  Pain Location: low back with sitting Pain Descriptors / Indicators: Sore Pain Intervention(s): Limited activity within patient's tolerance;Premedicated before session;Monitored during session;Repositioned    Home Living Family/patient expects to be discharged to:: Private residence Living Arrangements: Children Available Help at Discharge: Family;Available 24 hours/day Type of Home: House Home Access: Stairs to enter Entrance Stairs-Rails: Right;Left;Can reach both Entrance Stairs-Number of Steps: 1 Home Layout: One level Home Equipment: Environmental consultant - 2 wheels Additional Comments: reports she was wobbly on RW    Prior Function Level of Independence: Independent with assistive device(s) (but fell)               Hand Dominance   Dominant Hand: Right    Extremity/Trunk Assessment   Upper Extremity Assessment Upper Extremity Assessment: Overall WFL for tasks assessed    Lower Extremity Assessment Lower Extremity Assessment: RLE deficits/detail RLE Deficits / Details: 4- to4 strength RLE hip and knee    Cervical /  Trunk Assessment Cervical / Trunk Assessment: Kyphotic  Communication   Communication: No difficulties  Cognition Arousal/Alertness: Awake/alert Behavior During Therapy: WFL  for tasks assessed/performed Overall Cognitive Status: Within Functional Limits for tasks assessed         Following Commands: Follows one step commands consistently Safety/Judgement: Decreased awareness of deficits;Decreased awareness of safety     General Comments: pt is able to follow precautions well with cues    General Comments      Exercises     Assessment/Plan    PT Assessment Patient needs continued PT services  PT Problem List Decreased strength;Decreased range of motion;Decreased activity tolerance;Decreased balance;Decreased mobility;Decreased coordination;Decreased knowledge of use of DME;Decreased safety awareness;Cardiopulmonary status limiting activity;Decreased skin integrity;Pain       PT Treatment Interventions Gait training;DME instruction;Therapeutic activities;Therapeutic exercise;Balance training;Neuromuscular re-education;Patient/family education;Stair training;Functional mobility training    PT Goals (Current goals can be found in the Care Plan section)  Acute Rehab PT Goals Patient Stated Goal: none stated PT Goal Formulation: With patient Time For Goal Achievement: 02/26/17 Potential to Achieve Goals: Good    Frequency Min 3X/week   Barriers to discharge Other (comment) (has help with daughter at home to walk)      Co-evaluation               End of Session Equipment Utilized During Treatment: Gait belt Activity Tolerance: Patient tolerated treatment well Patient left: in bed;with call bell/phone within reach;with bed alarm set Nurse Communication: Mobility status;Other (comment) (called nurse to give report on walk without O2, sats 97% ) PT Visit Diagnosis: Repeated falls (R29.6);Unsteadiness on feet (R26.81)    Functional Assessment Tool Used: Clinical judgement Functional Limitation: Mobility: Walking and moving around Mobility: Walking and Moving Around Current Status ([id number]): At least 20 percent but less than 40 percent  impaired, limited or restricted Mobility: Walking and Moving Around Goal Status [phone]): At least 1 percent but less than 20 percent impaired, limited or restricted    Time: 0950-1017 PT Time Calculation (min) (ACUTE ONLY): 27 min   Charges:   PT Evaluation $PT Eval Moderate Complexity: 1 Procedure PT Treatments $Gait Training: 8-22 mins   PT G Codes:   PT G-Codes **NOT FOR INPATIENT CLASS** Functional Assessment Tool Used: Clinical judgement Functional Limitation: Mobility: Walking and moving around Mobility: Walking and Moving Around Current Status ([id number]): At least 20 percent but less than 40 percent impaired, limited or restricted Mobility: Walking and Moving Around Goal Status [phone]): At least 1 percent but less than 20 percent impaired, limited or restricted     Ivar DrapeRuth E Shawnee Gambone 02/12/2017, 11:37 AM   Samul Dadauth Yehoshua Vitelli, PT MS Acute Rehab Dept. Number: [facility] [phone] and [facility] [phone]

## 2017-02-12 NOTE — Progress Notes (Signed)
[facility]  Patient Status: Active (receiving services up to time of hospitalization)  [facility] is providing the following services: PT  If patient discharges after hours, please call [phone].   Kizzie FurnishDonna Fellmy 02/12/2017, 11:17 AM

## 2017-02-12 NOTE — Progress Notes (Signed)
April [name] to be D/C'd to home with home health per MD order.  Discussed with the patient and all questions fully answered.  VSS, Skin clean, dry and intact without evidence of skin break down, no evidence of skin tears noted. IV catheter discontinued intact. Site without signs and symptoms of complications. Dressing and pressure applied.  An After Visit Summary was printed and given to the patient. Patient received prescription.  D/c education completed with patient/family including follow up instructions, medication list, d/c activities limitations if indicated, with other d/c instructions as indicated by MD - patient able to verbalize understanding, all questions fully answered.   Patient instructed to return to ED, call 911, or call MD for any changes in condition.   Patient escorted via WC, and D/C home via private auto.  [name] 02/12/2017 2:57 PM

## 2017-04-09 ENCOUNTER — Observation Stay (HOSPITAL_COMMUNITY): Payer: PPO

## 2017-04-09 ENCOUNTER — Inpatient Hospital Stay (HOSPITAL_COMMUNITY)
Admission: EM | Admit: 2017-04-09 | Discharge: 2017-04-13 | DRG: 438 | Disposition: A | Discharge status: Home / Self Care | Payer: PPO | Attending: Family Medicine | Admitting: Family Medicine

## 2017-04-09 ENCOUNTER — Emergency Department (HOSPITAL_COMMUNITY): Payer: PPO

## 2017-04-09 ENCOUNTER — Encounter (HOSPITAL_COMMUNITY): Payer: Self-pay | Admitting: Emergency Medicine

## 2017-04-09 DIAGNOSIS — F17210 Nicotine dependence, cigarettes, uncomplicated: Secondary | ICD-10-CM | POA: Diagnosis present

## 2017-04-09 DIAGNOSIS — Z8249 Family history of ischemic heart disease and other diseases of the circulatory system: Secondary | ICD-10-CM | POA: Diagnosis not present

## 2017-04-09 DIAGNOSIS — Z720 Tobacco use: Secondary | ICD-10-CM | POA: Diagnosis present

## 2017-04-09 DIAGNOSIS — E860 Dehydration: Secondary | ICD-10-CM | POA: Diagnosis not present

## 2017-04-09 DIAGNOSIS — E785 Hyperlipidemia, unspecified: Secondary | ICD-10-CM | POA: Diagnosis present

## 2017-04-09 DIAGNOSIS — F1010 Alcohol abuse, uncomplicated: Secondary | ICD-10-CM

## 2017-04-09 DIAGNOSIS — R52 Pain, unspecified: Secondary | ICD-10-CM | POA: Diagnosis not present

## 2017-04-09 DIAGNOSIS — K219 Gastro-esophageal reflux disease without esophagitis: Secondary | ICD-10-CM | POA: Diagnosis not present

## 2017-04-09 DIAGNOSIS — R296 Repeated falls: Secondary | ICD-10-CM | POA: Diagnosis present

## 2017-04-09 DIAGNOSIS — Z79899 Other long term (current) drug therapy: Secondary | ICD-10-CM

## 2017-04-09 DIAGNOSIS — M25551 Pain in right hip: Secondary | ICD-10-CM | POA: Diagnosis present

## 2017-04-09 DIAGNOSIS — E876 Hypokalemia: Secondary | ICD-10-CM | POA: Diagnosis present

## 2017-04-09 DIAGNOSIS — E871 Hypo-osmolality and hyponatremia: Secondary | ICD-10-CM | POA: Diagnosis present

## 2017-04-09 DIAGNOSIS — E872 Acidosis, unspecified: Secondary | ICD-10-CM | POA: Diagnosis present

## 2017-04-09 DIAGNOSIS — R109 Unspecified abdominal pain: Secondary | ICD-10-CM

## 2017-04-09 DIAGNOSIS — D539 Nutritional anemia, unspecified: Secondary | ICD-10-CM | POA: Diagnosis not present

## 2017-04-09 DIAGNOSIS — R51 Headache: Secondary | ICD-10-CM | POA: Diagnosis not present

## 2017-04-09 DIAGNOSIS — R64 Cachexia: Secondary | ICD-10-CM | POA: Diagnosis not present

## 2017-04-09 DIAGNOSIS — Z681 Body mass index (BMI) 19 or less, adult: Secondary | ICD-10-CM

## 2017-04-09 DIAGNOSIS — Z7141 Alcohol abuse counseling and surveillance of alcoholic: Secondary | ICD-10-CM | POA: Diagnosis not present

## 2017-04-09 DIAGNOSIS — F10129 Alcohol abuse with intoxication, unspecified: Secondary | ICD-10-CM | POA: Diagnosis present

## 2017-04-09 DIAGNOSIS — I10 Essential (primary) hypertension: Secondary | ICD-10-CM | POA: Diagnosis present

## 2017-04-09 DIAGNOSIS — F329 Major depressive disorder, single episode, unspecified: Secondary | ICD-10-CM | POA: Diagnosis not present

## 2017-04-09 DIAGNOSIS — F10220 Alcohol dependence with intoxication, uncomplicated: Secondary | ICD-10-CM | POA: Diagnosis not present

## 2017-04-09 DIAGNOSIS — Y904 Blood alcohol level of 80-99 mg/100 ml: Secondary | ICD-10-CM | POA: Diagnosis present

## 2017-04-09 DIAGNOSIS — R1084 Generalized abdominal pain: Secondary | ICD-10-CM | POA: Diagnosis not present

## 2017-04-09 DIAGNOSIS — G621 Alcoholic polyneuropathy: Secondary | ICD-10-CM | POA: Diagnosis not present

## 2017-04-09 DIAGNOSIS — F419 Anxiety disorder, unspecified: Secondary | ICD-10-CM | POA: Diagnosis not present

## 2017-04-09 DIAGNOSIS — K859 Acute pancreatitis without necrosis or infection, unspecified: Principal | ICD-10-CM | POA: Diagnosis present

## 2017-04-09 DIAGNOSIS — Z885 Allergy status to narcotic agent status: Secondary | ICD-10-CM | POA: Diagnosis not present

## 2017-04-09 DIAGNOSIS — Z880 Allergy status to penicillin: Secondary | ICD-10-CM

## 2017-04-09 DIAGNOSIS — W19XXXA Unspecified fall, initial encounter: Secondary | ICD-10-CM | POA: Diagnosis not present

## 2017-04-09 DIAGNOSIS — E43 Unspecified severe protein-calorie malnutrition: Secondary | ICD-10-CM | POA: Diagnosis not present

## 2017-04-09 DIAGNOSIS — M1611 Unilateral primary osteoarthritis, right hip: Secondary | ICD-10-CM | POA: Diagnosis not present

## 2017-04-09 DIAGNOSIS — G4489 Other headache syndrome: Secondary | ICD-10-CM | POA: Diagnosis not present

## 2017-04-09 DIAGNOSIS — R110 Nausea: Secondary | ICD-10-CM | POA: Diagnosis not present

## 2017-04-09 LAB — BASIC METABOLIC PANEL
ANION GAP: 17 — AB (ref 5–15)
BUN: 15 mg/dL (ref 6–20)
CALCIUM: 7.4 mg/dL — AB (ref 8.9–10.3)
CO2: 18 mmol/L — AB (ref 22–32)
Chloride: 97 mmol/L — ABNORMAL LOW (ref 101–111)
Creatinine, Ser: 0.74 mg/dL (ref 0.44–1.00)
Glucose, Bld: 254 mg/dL — ABNORMAL HIGH (ref 65–99)
POTASSIUM: 3.3 mmol/L — AB (ref 3.5–5.1)
Sodium: 132 mmol/L — ABNORMAL LOW (ref 135–145)

## 2017-04-09 LAB — I-STAT VENOUS BLOOD GAS, ED
Acid-base deficit: 11 mmol/L — ABNORMAL HIGH (ref 0.0–2.0)
Bicarbonate: 13.8 mmol/L — ABNORMAL LOW (ref 20.0–28.0)
O2 Saturation: 59 %
PCO2 VEN: 27.1 mmHg — AB (ref 44.0–60.0)
PO2 VEN: 33 mmHg (ref 32.0–45.0)
TCO2: 15 mmol/L (ref 0–100)
pH, Ven: 7.315 (ref 7.250–7.430)

## 2017-04-09 LAB — URINALYSIS, ROUTINE W REFLEX MICROSCOPIC
Bilirubin Urine: NEGATIVE
GLUCOSE, UA: NEGATIVE mg/dL
Ketones, ur: 80 mg/dL — AB
Leukocytes, UA: NEGATIVE
NITRITE: NEGATIVE
PH: 5 (ref 5.0–8.0)
Protein, ur: 100 mg/dL — AB
Specific Gravity, Urine: 1.02 (ref 1.005–1.030)

## 2017-04-09 LAB — COMPREHENSIVE METABOLIC PANEL
ALK PHOS: 156 U/L — AB (ref 38–126)
ALT: 46 U/L (ref 14–54)
ANION GAP: 23 — AB (ref 5–15)
AST: 163 U/L — ABNORMAL HIGH (ref 15–41)
Albumin: 3 g/dL — ABNORMAL LOW (ref 3.5–5.0)
BILIRUBIN TOTAL: 1 mg/dL (ref 0.3–1.2)
BUN: 22 mg/dL — ABNORMAL HIGH (ref 6–20)
CO2: 14 mmol/L — ABNORMAL LOW (ref 22–32)
Calcium: 7.9 mg/dL — ABNORMAL LOW (ref 8.9–10.3)
Chloride: 101 mmol/L (ref 101–111)
Creatinine, Ser: 0.79 mg/dL (ref 0.44–1.00)
Glucose, Bld: 59 mg/dL — ABNORMAL LOW (ref 65–99)
Potassium: 2.8 mmol/L — ABNORMAL LOW (ref 3.5–5.1)
Sodium: 138 mmol/L (ref 135–145)
TOTAL PROTEIN: 5.6 g/dL — AB (ref 6.5–8.1)

## 2017-04-09 LAB — LIPASE, BLOOD: Lipase: 79 U/L — ABNORMAL HIGH (ref 11–51)

## 2017-04-09 LAB — ETHANOL: Alcohol, Ethyl (B): 84 mg/dL — ABNORMAL HIGH (ref <5)

## 2017-04-09 LAB — CBC
HCT: 30.4 % — ABNORMAL LOW (ref 36.0–46.0)
HEMOGLOBIN: 10.4 g/dL — AB (ref 12.0–15.0)
MCH: 33.1 pg (ref 26.0–34.0)
MCHC: 34.2 g/dL (ref 30.0–36.0)
MCV: 96.8 fL (ref 78.0–100.0)
Platelets: 448 10*3/uL — ABNORMAL HIGH (ref 150–400)
RBC: 3.14 MIL/uL — AB (ref 3.87–5.11)
RDW: 15 % (ref 11.5–15.5)
WBC: 8.1 10*3/uL (ref 4.0–10.5)

## 2017-04-09 LAB — GLUCOSE, CAPILLARY: GLUCOSE-CAPILLARY: 122 mg/dL — AB (ref 65–99)

## 2017-04-09 LAB — LACTIC ACID, PLASMA: Lactic Acid, Venous: 1.2 mmol/L (ref 0.5–1.9)

## 2017-04-09 LAB — I-STAT CG4 LACTIC ACID, ED: Lactic Acid, Venous: 2.03 mmol/L (ref 0.5–1.9)

## 2017-04-09 LAB — MAGNESIUM: MAGNESIUM: 1.3 mg/dL — AB (ref 1.7–2.4)

## 2017-04-09 LAB — MRSA PCR SCREENING: MRSA BY PCR: NEGATIVE

## 2017-04-09 MED ORDER — IOPAMIDOL (ISOVUE-300) INJECTION 61%
INTRAVENOUS | Status: AC
  Administered 2017-04-09: 30 mL
  Filled 2017-04-09: qty 30

## 2017-04-09 MED ORDER — ONDANSETRON HCL 4 MG/2ML IJ SOLN
4.0000 mg | Freq: Once | INTRAMUSCULAR | Status: AC | PRN
  Administered 2017-04-09: 4 mg via INTRAVENOUS
  Filled 2017-04-09: qty 2

## 2017-04-09 MED ORDER — SODIUM CHLORIDE 0.9 % IV SOLN
INTRAVENOUS | Status: DC
  Administered 2017-04-09 – 2017-04-10 (×2): via INTRAVENOUS

## 2017-04-09 MED ORDER — SODIUM CHLORIDE 0.9 % IV BOLUS (SEPSIS)
1000.0000 mL | Freq: Once | INTRAVENOUS | Status: AC
  Administered 2017-04-09: 1000 mL via INTRAVENOUS

## 2017-04-09 MED ORDER — ADULT MULTIVITAMIN W/MINERALS CH
1.0000 | ORAL_TABLET | Freq: Every day | ORAL | Status: DC
  Administered 2017-04-10 – 2017-04-12 (×2): 1 via ORAL
  Filled 2017-04-09 (×2): qty 1

## 2017-04-09 MED ORDER — SODIUM CHLORIDE 0.9 % IV SOLN
30.0000 meq | Freq: Once | INTRAVENOUS | Status: AC
  Administered 2017-04-09: 30 meq via INTRAVENOUS
  Filled 2017-04-09: qty 15

## 2017-04-09 MED ORDER — FOLIC ACID 1 MG PO TABS
1.0000 mg | ORAL_TABLET | Freq: Every day | ORAL | Status: DC
  Administered 2017-04-10 – 2017-04-13 (×3): 1 mg via ORAL
  Filled 2017-04-09 (×3): qty 1

## 2017-04-09 MED ORDER — NICOTINE 21 MG/24HR TD PT24
21.0000 mg | MEDICATED_PATCH | Freq: Once | TRANSDERMAL | Status: AC
  Administered 2017-04-09: 21 mg via TRANSDERMAL
  Filled 2017-04-09: qty 1

## 2017-04-09 MED ORDER — ACETAMINOPHEN 325 MG PO TABS
650.0000 mg | ORAL_TABLET | Freq: Four times a day (QID) | ORAL | Status: DC | PRN
  Administered 2017-04-10 – 2017-04-12 (×5): 650 mg via ORAL
  Filled 2017-04-09 (×5): qty 2

## 2017-04-09 MED ORDER — ACETAMINOPHEN 650 MG RE SUPP
650.0000 mg | Freq: Four times a day (QID) | RECTAL | Status: DC | PRN
Start: 2017-04-09 — End: 2017-04-13
  Administered 2017-04-09: 650 mg via RECTAL
  Filled 2017-04-09: qty 1

## 2017-04-09 MED ORDER — SODIUM CHLORIDE 0.9% FLUSH
3.0000 mL | Freq: Two times a day (BID) | INTRAVENOUS | Status: DC
  Administered 2017-04-10 – 2017-04-13 (×7): 3 mL via INTRAVENOUS

## 2017-04-09 MED ORDER — LORAZEPAM 2 MG/ML IJ SOLN: 1.0000 mg | Freq: Once | INTRAMUSCULAR | Status: DC

## 2017-04-09 MED ORDER — CLONAZEPAM 0.5 MG PO TABS: 0.5000 mg | ORAL_TABLET | Freq: Two times a day (BID) | ORAL | Status: DC | PRN

## 2017-04-09 MED ORDER — LORAZEPAM 2 MG/ML IJ SOLN
2.0000 mg | INTRAMUSCULAR | Status: DC | PRN
  Administered 2017-04-10 – 2017-04-12 (×4): 2 mg via INTRAVENOUS
  Filled 2017-04-09 (×4): qty 1

## 2017-04-09 MED ORDER — MAGNESIUM SULFATE 2 GM/50ML IV SOLN
2.0000 g | Freq: Once | INTRAVENOUS | Status: AC
  Administered 2017-04-09: 2 g via INTRAVENOUS
  Filled 2017-04-09: qty 50

## 2017-04-09 MED ORDER — VITAMIN B-1 100 MG PO TABS
100.0000 mg | ORAL_TABLET | Freq: Every day | ORAL | Status: DC
  Administered 2017-04-10 – 2017-04-13 (×3): 100 mg via ORAL
  Filled 2017-04-09 (×3): qty 1

## 2017-04-09 MED ORDER — METOCLOPRAMIDE HCL 5 MG/ML IJ SOLN
10.0000 mg | Freq: Once | INTRAMUSCULAR | Status: AC
  Administered 2017-04-09: 10 mg via INTRAVENOUS
  Filled 2017-04-09: qty 2

## 2017-04-09 MED ORDER — FAMOTIDINE IN NACL 20-0.9 MG/50ML-% IV SOLN
20.0000 mg | Freq: Two times a day (BID) | INTRAVENOUS | Status: DC
  Administered 2017-04-10 – 2017-04-13 (×7): 20 mg via INTRAVENOUS
  Filled 2017-04-09 (×10): qty 50

## 2017-04-09 MED ORDER — ONDANSETRON HCL 4 MG PO TABS
4.0000 mg | ORAL_TABLET | Freq: Four times a day (QID) | ORAL | Status: DC | PRN
  Administered 2017-04-10: 4 mg via ORAL
  Filled 2017-04-09 (×2): qty 1

## 2017-04-09 MED ORDER — ONDANSETRON HCL 4 MG/2ML IJ SOLN
4.0000 mg | Freq: Four times a day (QID) | INTRAMUSCULAR | Status: DC | PRN
  Administered 2017-04-09 – 2017-04-13 (×3): 4 mg via INTRAVENOUS
  Filled 2017-04-09 (×3): qty 2

## 2017-04-09 MED ORDER — THIAMINE HCL 100 MG/ML IJ SOLN
100.0000 mg | Freq: Once | INTRAMUSCULAR | Status: AC
  Administered 2017-04-09: 100 mg via INTRAVENOUS
  Filled 2017-04-09: qty 2

## 2017-04-09 MED ORDER — SODIUM CHLORIDE 0.9 % IV SOLN
Freq: Once | INTRAVENOUS | Status: AC
  Administered 2017-04-09: 21:00:00 via INTRAVENOUS
  Filled 2017-04-09: qty 1000

## 2017-04-09 MED ORDER — DEXTROSE 5 % IV BOLUS
1000.0000 mL | Freq: Once | INTRAVENOUS | Status: AC
  Administered 2017-04-09: 1000 mL via INTRAVENOUS

## 2017-04-09 NOTE — ED Triage Notes (Signed)
Pt arrives from home via [facility] rc/o persistent nausea after a fall on Monday.  Pt reports hitting head, reports headache since fall.  EMS reports pt ortho static, report giving 500 mL NS, 4 mg Zofran.  Pt disoriented to time on arrival, reports ETOH today.

## 2017-04-09 NOTE — ED Provider Notes (Signed)
[facility]-EMERGENCY DEPT Provider Note   CSN: [id number] Arrival date & time: 04/09/17  1418     History   Chief Complaint Chief Complaint  Patient presents with  . Fall  . Nausea    HPI April [name] is a 71 y.o. female.  Patient is a 71 year old female with a history of alcohol abuse, hypertension, hyperlipidemia, malnutrition, frequent falls presenting today after a fall 4 days ago. Patient states her legs gave out and she fell to her left side hitting her head in her hip on the floor. Since that time she has had ongoing headache and nausea. She has been unable to hold anything down for the last 4 days. She has been vomiting anytime she eats. She has still been drinking alcohol but daughter states that now she is unable to get up and move around the house independently. She normally can take care of her ADLs and now she requires her daughter to be with her anytime she is up. Patient has some mild stomach discomfort but no localized pain. She has had decreased urine output. No fever, chest pain or shortness of breath. She denies any syncope. She can recall the entire fall and did not lose consciousness. But she continues to have a headache on the left side of her head. She denies any vision changes or unilateral weakness.   The history is provided by the patient.  Fall     Past Medical History:  Diagnosis Date  . Anxiety   . Current smoker   . Depression   . ETOH abuse   . GERD (gastroesophageal reflux disease)   . History of shingles summer 2015  . HTN (hypertension)   . Hyperlipidemia   . Hypertension   . Leg edema   . Neuropathy   . Neuropathy     Patient Active Problem List   Diagnosis Date Noted  . Frequent falls 02/11/2017  . Protein-calorie malnutrition, severe 02/05/2017  . Gallstone pancreatitis 01/30/2017  . Hypokalemia 01/30/2017  . Macrocytic anemia 01/30/2017  . Metabolic acidosis 01/30/2017  . Neuropathy, alcoholic (HCC) 09/08/2016  . Neuropathy  12/11/2015  . Alcohol abuse 12/11/2015  . Tobacco abuse 12/11/2015  . Chronic venous insufficiency 09/13/2015  . Extremity numbness 09/13/2015    Past Surgical History:  Procedure Laterality Date  . ERCP N/A 02/01/2017   Procedure: ENDOSCOPIC RETROGRADE CHOLANGIOPANCREATOGRAPHY (ERCP);  Surgeon: Dorena Cookey, MD;  Location: [facility];  Service: Endoscopy;  Laterality: N/A;  . LAPAROSCOPIC CHOLECYSTECTOMY WITH COMMON DUCT EXPLORATION AND INTRAOP CHOLANGIOGRAM N/A 02/03/2017   Procedure: LAPAROSCOPIC CHOLECYSTECTOMY WITH POSSIBLE COMMON DUCT EXPLORATION AND INTRAOP CHOLANGIOGRAM;  Surgeon: Ovidio Kin, MD;  Location: [facility];  Service: General;  Laterality: N/A;  . removal of uterine fibroids  2004    OB History    No data available       Home Medications    Prior to Admission medications   Medication Sig Start Date End Date Taking? Authorizing Provider  amitriptyline (ELAVIL) 25 MG tablet Take 25 mg by mouth at bedtime as needed for sleep.  08/27/16   Historical Provider, MD  clonazePAM (KLONOPIN) 0.5 MG tablet Take 0.5 mg by mouth 2 (two) times daily as needed for anxiety.    Historical Provider, MD  gabapentin (NEURONTIN) 300 MG capsule Take 2 capsules (600 mg total) by mouth 3 (three) times daily. 10/29/16   Anson Fret, MD  ibuprofen (ADVIL,MOTRIN) 200 MG tablet Take 400 mg by mouth every 6 (six) hours as needed for moderate  pain.    Historical Provider, MD  oxyCODONE-acetaminophen (ROXICET) 5-325 MG tablet Take 1 tablet by mouth every 6 (six) hours as needed for severe pain. 02/12/17   Jerald Kief, MD  vitamin B-12 1000 MCG tablet Take 1 tablet (1,000 mcg total) by mouth daily. 02/07/17   Tyrone Nine, MD  Vitamin D, Ergocalciferol, (DRISDOL) 50000 units CAPS capsule Take 50,000 Units by mouth once a week. 07/31/16   Historical Provider, MD    Family History Family History  Problem Relation Age of Onset  . Heart disease Father   . Neuropathy Neg Hx     Social  History Social History  Substance Use Topics  . Smoking status: Current Every Day Smoker    Packs/day: 1.00    Types: Cigarettes  . Smokeless tobacco: Never Used  . Alcohol use 8.4 oz/week    14 Shots of liquor per week     Comment: states., "I keep a glass with me", reports drinking today (04/09/17)     Allergies   Naltrexone and Penicillins   Review of Systems Review of Systems  All other systems reviewed and are negative.    Physical Exam Updated Vital Signs BP 109/71   Pulse 88   Temp 98.4 F (36.9 C) (Oral)   Resp (!) 21   Ht  (1.676 m)   Wt 120 lb (54.4 kg)   SpO2 100%   BMI 19.37 kg/m   Physical Exam  Constitutional: She is oriented to person, place, and time. She appears well-developed. No distress.  Smells of ketones and alcohol and cachectic  HENT:  Head: Normocephalic and atraumatic.  Mouth/Throat: Mucous membranes are dry.  Eyes: Conjunctivae and EOM are normal. Pupils are equal, round, and reactive to light.  Neck: Normal range of motion. Neck supple.  Cardiovascular: Normal rate, regular rhythm and intact distal pulses.   No murmur heard. Pulmonary/Chest: Effort normal and breath sounds normal. No respiratory distress. She has no wheezes. She has no rales.  Abdominal: Soft. She exhibits no distension. There is no tenderness. There is no rebound and no guarding.  Musculoskeletal: Normal range of motion. She exhibits no edema or tenderness.  Neurological: She is alert and oriented to person, place, and time.  Can move all ext equally and no pronator drift  Skin: Skin is warm and dry. Capillary refill takes more than 3 seconds. No rash noted. No erythema.  Psychiatric: She has a normal mood and affect. Her behavior is normal.  Nursing note and vitals reviewed.    ED Treatments / Results  Labs (all labs ordered are listed, but only abnormal results are displayed) Labs Reviewed  CBC - Abnormal; Notable for the following:       Result Value    RBC 3.14 (*)    Hemoglobin 10.4 (*)    HCT 30.4 (*)    Platelets 448 (*)    All other components within normal limits  ETHANOL - Abnormal; Notable for the following:    Alcohol, Ethyl (B) 84 (*)    All other components within normal limits  LIPASE, BLOOD  COMPREHENSIVE METABOLIC PANEL  URINALYSIS, ROUTINE W REFLEX MICROSCOPIC  I-STAT CG4 LACTIC ACID, ED  I-STAT VENOUS BLOOD GAS, ED    EKG  EKG Interpretation  Date/Time:  Friday April 09 2017 14:28:42 EDT Ventricular Rate:  90 PR Interval:    QRS Duration: 94 QT Interval:  372 QTC Calculation: 453 R Axis:   -62 Text Interpretation:  Sinus rhythm Biatrial  enlargement Left anterior fascicular block Anteroseptal infarct, age indeterminate Baseline wander Confirmed by [facility]  MD, Timara Loma ([id number]) on 04/09/2017 3:42:16 PM       Radiology No results found.  Procedures Procedures (including critical care time)  Medications Ordered in ED Medications  metoCLOPramide (REGLAN) injection 10 mg (not administered)  sodium chloride 0.9 % bolus 1,000 mL (not administered)  ondansetron (ZOFRAN) injection 4 mg (4 mg Intravenous Given 04/09/17 1548)     Initial Impression / Assessment and Plan / ED Course  I have reviewed the triage vital signs and the nursing notes.  Pertinent labs & imaging results that were available during my care of the patient were reviewed by me and considered in my medical decision making (see chart for details).     Patient is a 71 year old female with a history of chronic alcohol abuse and frequent falls presenting today for a fall that occurred 4 days ago with persistent nausea, vomiting, headache and dehydration. Patient has continued to drink alcohol smells of ketones on exam. She appears dehydrated with poor skin turgor in greater than 3 second cap refill. Vital signs are stable. Patient is awake and alert with no focal neurologic deficits. She takes no anticoagulation but continues to complain of a  headache after hitting her head when she fell. Patient given IV fluids. Concern for alcoholic ketosis in the setting of poor by mouth intake and ongoing vomiting. Patient's VBG has a normal pH but a bicarbonate of 13. Also compensated metabolic acidosis with a CO2 of 27.  Lactic acid of 2, CBC with unchanged values. Head CT, EKG, lipase, CMP pending. Patient given Reglan does well for nausea. Concern for possible intracranial hemorrhage versus concussion after her fall.  CT of head neg.  Pt improved nausea after reglan and will po challenge.  Pt CMP with hypokalemia which was replaced and Mg pending and AG of 23.  Pt given D5 and thiamine for hypoglycemia.  Will admit for further care.  Final Clinical Impressions(s) / ED Diagnoses   Final diagnoses:  Hypokalemia  Chronic alcohol abuse  Dehydration  Metabolic acidosis    New Prescriptions New Prescriptions   No medications on file     Gwyneth Sprout, MD 04/09/17 [phone]

## 2017-04-09 NOTE — ED Notes (Signed)
Lab results reported to Dr.Plunkett. 

## 2017-04-09 NOTE — H&P (Signed)
History and Physical    ADRIEN DIETZMAN HER:[id number] DOB: 24-Sep-1946 DOA: 04/09/2017  PCP: Tula Nakayama   Patient coming from: Home  Chief Complaint: Headache, nausea and vomiting, after fall four days ago  HPI: April [name] is a 71 y.o. woman with a history of HTN, HLD, anxiety/depression, tobacco and EtOH dependence, severe protein calorie malnutrition, chronic debility, peripheral neuropathy, and recurrent falls who presents to the ED for evaluation of headache, nausea, and vomiting after having an unwitnessed fall at home four days ago.  The patient reports hitting her head, but she was able to get herself up off the ground.  She denies loss of consciousness.  She denies bowel or bladder incontinence.  Since then, she has had recurrent headache but no associated vision disturbance.  She has had nausea, vomiting, and abdominal pain, but has continued to "sip" Brandy daily.  Emesis has been nonbloody.  She denies chest pain or shortness of breath.      She is currently complaining of right hip pain, indigestion, and nausea.  Her daughter reports progressive weakness and debility since her last admission for fall and sacral fracture.  Of note, her daughter also mentions that she is not taking any of her medications except prn Klonopin.  ED Course: CT head shows atrophy.  Lipase slightly elevated at 79.  AST 163.  Alk phos 156.  EtOH level 84.  Potassium level 2.8.  Magnesium level 1.3.  BUN 22.  Creatinine 0.79.  Glucose 59.  Bicarb 14.  Lactic acid level 2.03. Normal WBC count.  Hgb 10.4.    The patient has received 1L NS, 1L D5W, Reglan, zofran, thiamine, 29mq of KCL, and 2 grams of magnesium.  Hospitalist asked to admit.  Review of Systems: As per HPI otherwise 10 systems reviewed and negative.   Past Medical History:  Diagnosis Date  . Anxiety   . Current smoker   . Depression   . ETOH abuse   . GERD (gastroesophageal reflux disease)   . History of shingles  summer 2015  . HTN (hypertension)   . Hyperlipidemia   . Hypertension   . Leg edema   . Neuropathy   . Neuropathy     Past Surgical History:  Procedure Laterality Date  . ERCP N/A 02/01/2017   Procedure: ENDOSCOPIC RETROGRADE CHOLANGIOPANCREATOGRAPHY (ERCP);  Surgeon: JTeena Irani MD;  [facility];  Service: Endoscopy;  Laterality: N/A;  . LAPAROSCOPIC CHOLECYSTECTOMY WITH COMMON DUCT EXPLORATION AND INTRAOP CHOLANGIOGRAM N/A 02/03/2017   Procedure: LAPAROSCOPIC CHOLECYSTECTOMY WITH POSSIBLE COMMON DUCT EXPLORATION AND INTRAOP CHOLANGIOGRAM;  Surgeon: DAlphonsa Overall MD;  Location: [facility];  Service: General;  Laterality: N/A;  . removal of uterine fibroids  2004     reports that she has been smoking Cigarettes.  She has been smoking about 1.00 pack per day. She has never used smokeless tobacco. She reports that she drinks about 8.4 oz of alcohol per week . She reports that she does not use drugs.  She lives with her daughter.    Allergies  Allergen Reactions  . Naltrexone Shortness Of Breath  . Penicillins Rash    Has patient had a PCN reaction causing immediate rash, facial/tongue/throat swelling, SOB or lightheadedness with hypotension: Yes immediate rash  Has patient had a PCN reaction causing severe rash involving mucus membranes or skin necrosis: Yes Has patient had a PCN reaction that required hospitalization": No Has patient had a PCN reaction occurring within the last 10 years: Yes If all of  the above answers are "NO", then may proceed with Cephalosporin use.     Family History  Problem Relation Age of Onset  . Heart disease Father   . Neuropathy Neg Hx      Prior to Admission medications   Medication Sig Start Date End Date Taking? Authorizing Provider  amitriptyline (ELAVIL) 25 MG tablet Take 25 mg by mouth at bedtime as needed for sleep.  08/27/16   Historical Provider, MD  clonazePAM (KLONOPIN) 0.5 MG tablet Take 0.5 mg by mouth 2 (two) times daily as  needed for anxiety.    Historical Provider, MD  gabapentin (NEURONTIN) 300 MG capsule Take 2 capsules (600 mg total) by mouth 3 (three) times daily. 10/29/16   Melvenia Beam, MD  ibuprofen (ADVIL,MOTRIN) 200 MG tablet Take 400 mg by mouth every 6 (six) hours as needed for moderate pain.    Historical Provider, MD  oxyCODONE-acetaminophen (ROXICET) 5-325 MG tablet Take 1 tablet by mouth every 6 (six) hours as needed for severe pain. 02/12/17   Donne Hazel, MD  vitamin B-12 1000 MCG tablet Take 1 tablet (1,000 mcg total) by mouth daily. 02/07/17   Patrecia Pour, MD  Vitamin D, Ergocalciferol, (DRISDOL) 50000 units CAPS capsule Take 50,000 Units by mouth once a week. 07/31/16   Historical Provider, MD    Physical Exam: Vitals:   04/09/17 1630 04/09/17 1700 04/09/17 1915 04/09/17 1945  BP: 114/70 120/73 (!) 142/79 (!) 126/91  Pulse: 94 88 (!) 109 (!) 114  Resp: (!) 24 (!) 31  (!) 28  Temp:      TempSrc:      SpO2: 99% 100% 98% 100%  Weight:      Height:          Constitutional: NAD, calm, comfortable, chronically ill appearing with bilateral temporal wasting Vitals:   04/09/17 1630 04/09/17 1700 04/09/17 1915 04/09/17 1945  BP: 114/70 120/73 (!) 142/79 (!) 126/91  Pulse: 94 88 (!) 109 (!) 114  Resp: (!) 24 (!) 31  (!) 28  Temp:      TempSrc:      SpO2: 99% 100% 98% 100%  Weight:      Height:       Eyes: PERRL, lids and conjunctivae normal ENMT: Mucous membranes are dry. Posterior pharynx clear of any exudate or lesions. Poor dentition.  Neck: thin, supple, no masses Respiratory: clear to auscultation bilaterally, no wheezing, no crackles. Normal respiratory effort. No accessory muscle use.  Cardiovascular: Normal rate, regular rhythm, no murmurs / rubs / gallops. No extremity edema. Barely palpable pulses. GI: abdomen is soft and compressible.  No distention.  No tenderness.  Bowel sounds are present. Musculoskeletal:  No joint deformity in upper and lower extremities. Good ROM,  no contractures. Normal muscle tone.  Skin: no rashes, stage 1 pressure ulcers to left buttock and sacrum, present on admission, pale, cool, dry Neurologic: CN 2-12 grossly intact. Sensation intact, Strength symmetric bilaterally, generalized weakness. Psychiatric: Alert and oriented x 3. Flat affect.  Judgment impaired.    Labs on Admission: I have personally reviewed following labs and imaging studies  CBC:  Recent Labs Lab 04/09/17 1550  WBC 8.1  HGB 10.4*  HCT 30.4*  MCV 96.8  PLT 517*   Basic Metabolic Panel:  Recent Labs Lab 04/09/17 1550 04/09/17 1753  NA 138  --   K 2.8*  --   CL 101  --   CO2 14*  --   GLUCOSE 59*  --  BUN 22*  --   CREATININE 0.79  --   CALCIUM 7.9*  --   MG  --  1.3*   GFR: Estimated Creatinine Clearance: 57 mL/min (by C-G formula based on SCr of 0.79 mg/dL). Liver Function Tests:  Recent Labs Lab 04/09/17 1550  AST 163*  ALT 46  ALKPHOS 156*  BILITOT 1.0  PROT 5.6*  ALBUMIN 3.0*    Recent Labs Lab 04/09/17 1550  LIPASE 79*   Urine analysis:    Component Value Date/Time   COLORURINE YELLOW 04/09/2017 1807   APPEARANCEUR HAZY (A) 04/09/2017 1807   LABSPEC 1.020 04/09/2017 1807   PHURINE 5.0 04/09/2017 1807   GLUCOSEU NEGATIVE 04/09/2017 1807   HGBUR SMALL (A) 04/09/2017 1807   BILIRUBINUR NEGATIVE 04/09/2017 1807   KETONESUR 80 (A) 04/09/2017 1807   PROTEINUR 100 (A) 04/09/2017 1807   NITRITE NEGATIVE 04/09/2017 1807   LEUKOCYTESUR NEGATIVE 04/09/2017 1807   Sepsis Labs:  Lactic acid level 2.03, repeat 1.2.  Radiological Exams on Admission: Ct Head Wo Contrast  Result Date: 04/09/2017 CLINICAL DATA:  Pain after fall.  Headache. EXAM: CT HEAD WITHOUT CONTRAST TECHNIQUE: Contiguous axial images were obtained from the base of the skull through the vertex without intravenous contrast. COMPARISON:  February 11, 2017 FINDINGS: Brain: No subdural,, epidural, or subarachnoid hemorrhage. Ventricular and sulcal prominence  consistent with persistent atrophy. Cerebellum, brainstem, and basal cisterns are normal. No mass, mass effect, or midline shift. No acute cortical ischemia or infarct. Vascular: Calcified atherosclerosis is seen in the intra carotid arteries. Skull: No fractures are identified. Sinuses/Orbits: Mucosal thickening is seen posteriorly in the left maxillary sinus, unchanged since March of 2018. No acute paranasal sinus abnormalities or changes. Mastoid air cells the middle ears are well aerated. Other: None. IMPRESSION: No acute intracranial abnormalities.  Generalized atrophy is stable. Electronically Signed   By: Dorise Bullion III M.D   On: 04/09/2017 17:17    EKG: Independently reviewed. NSR, no acute ST segment changes.  LAFB is not new.  Assessment/Plan Principal Problem:   Hypokalemia Active Problems:   Alcohol abuse   Tobacco abuse   Neuropathy, alcoholic (HCC)   Macrocytic anemia   Metabolic acidosis   Protein-calorie malnutrition, severe   Frequent falls      Nausea, vomiting, and abdominal pain in patient with active EtOH use/acute intoxication. --CT abdomen and pelvis with contrast --IV pepcid BID --Anti-emetics as needed --Will leave her NPO until the results of the CT are known --NS at 100cc/hr  Abnormal LFTs  --Attributed to active EtOH use at this time  Right hip pain with history of fall at home --Will check xray --PT eval and treat  Metabolic acidosis with respiratory compensation (RR of 30 noted) --Suspect secondary to EtOH abuse, monitor response to hydration with NS.  Will not start a bicarb drip yet.  Hypokalemia and hypomagnesemia --Additional 73mq of IV KCl ordered --2 grams of magnesium ordered in the ED; she will likely need oral supplementation when tolerating PO again  Severe protein calorie malnutrition --Nutrition consult   DVT prophylaxis: Low risk, outpatient status Code Status: FULL Family Communication: Daughter present in the ED at time  of admission. Disposition Plan: Expect she will go home at discharge. Consults called: NONE Admission status: Place in observation, stepdown unit due to severity of electrolyte abnormalities.   TIME SPENT: 60 minutes  CEber JonesMD [facility] Pager [phone] If 7PM-7AM, please contact night-coverage [email or web address] Password [facility] 04/09/2017, 8:23 PM

## 2017-04-10 ENCOUNTER — Encounter (HOSPITAL_COMMUNITY): Payer: Self-pay

## 2017-04-10 ENCOUNTER — Observation Stay (HOSPITAL_COMMUNITY): Payer: PPO

## 2017-04-10 DIAGNOSIS — D539 Nutritional anemia, unspecified: Secondary | ICD-10-CM | POA: Diagnosis not present

## 2017-04-10 DIAGNOSIS — M25551 Pain in right hip: Secondary | ICD-10-CM | POA: Diagnosis not present

## 2017-04-10 DIAGNOSIS — E876 Hypokalemia: Secondary | ICD-10-CM | POA: Diagnosis not present

## 2017-04-10 DIAGNOSIS — R64 Cachexia: Secondary | ICD-10-CM | POA: Diagnosis not present

## 2017-04-10 DIAGNOSIS — R109 Unspecified abdominal pain: Secondary | ICD-10-CM | POA: Diagnosis not present

## 2017-04-10 DIAGNOSIS — Y904 Blood alcohol level of 80-99 mg/100 ml: Secondary | ICD-10-CM | POA: Diagnosis present

## 2017-04-10 DIAGNOSIS — W19XXXA Unspecified fall, initial encounter: Secondary | ICD-10-CM | POA: Diagnosis not present

## 2017-04-10 DIAGNOSIS — F17210 Nicotine dependence, cigarettes, uncomplicated: Secondary | ICD-10-CM | POA: Diagnosis not present

## 2017-04-10 DIAGNOSIS — E872 Acidosis: Secondary | ICD-10-CM | POA: Diagnosis not present

## 2017-04-10 DIAGNOSIS — F419 Anxiety disorder, unspecified: Secondary | ICD-10-CM | POA: Diagnosis not present

## 2017-04-10 DIAGNOSIS — Z8249 Family history of ischemic heart disease and other diseases of the circulatory system: Secondary | ICD-10-CM | POA: Diagnosis not present

## 2017-04-10 DIAGNOSIS — I10 Essential (primary) hypertension: Secondary | ICD-10-CM | POA: Diagnosis not present

## 2017-04-10 DIAGNOSIS — F329 Major depressive disorder, single episode, unspecified: Secondary | ICD-10-CM | POA: Diagnosis not present

## 2017-04-10 DIAGNOSIS — G621 Alcoholic polyneuropathy: Secondary | ICD-10-CM | POA: Diagnosis not present

## 2017-04-10 DIAGNOSIS — R296 Repeated falls: Secondary | ICD-10-CM | POA: Diagnosis not present

## 2017-04-10 DIAGNOSIS — Z885 Allergy status to narcotic agent status: Secondary | ICD-10-CM | POA: Diagnosis not present

## 2017-04-10 DIAGNOSIS — F10129 Alcohol abuse with intoxication, unspecified: Secondary | ICD-10-CM | POA: Diagnosis not present

## 2017-04-10 DIAGNOSIS — Z681 Body mass index (BMI) 19 or less, adult: Secondary | ICD-10-CM | POA: Diagnosis not present

## 2017-04-10 DIAGNOSIS — E785 Hyperlipidemia, unspecified: Secondary | ICD-10-CM | POA: Diagnosis not present

## 2017-04-10 DIAGNOSIS — E860 Dehydration: Secondary | ICD-10-CM | POA: Diagnosis not present

## 2017-04-10 DIAGNOSIS — K219 Gastro-esophageal reflux disease without esophagitis: Secondary | ICD-10-CM | POA: Diagnosis not present

## 2017-04-10 DIAGNOSIS — E871 Hypo-osmolality and hyponatremia: Secondary | ICD-10-CM | POA: Diagnosis not present

## 2017-04-10 DIAGNOSIS — K859 Acute pancreatitis without necrosis or infection, unspecified: Secondary | ICD-10-CM | POA: Diagnosis not present

## 2017-04-10 DIAGNOSIS — Z7141 Alcohol abuse counseling and surveillance of alcoholic: Secondary | ICD-10-CM | POA: Diagnosis not present

## 2017-04-10 DIAGNOSIS — Z79899 Other long term (current) drug therapy: Secondary | ICD-10-CM | POA: Diagnosis not present

## 2017-04-10 DIAGNOSIS — E43 Unspecified severe protein-calorie malnutrition: Secondary | ICD-10-CM | POA: Diagnosis not present

## 2017-04-10 LAB — CBC
HCT: 27 % — ABNORMAL LOW (ref 36.0–46.0)
Hemoglobin: 9.2 g/dL — ABNORMAL LOW (ref 12.0–15.0)
MCH: 32.9 pg (ref 26.0–34.0)
MCHC: 34.1 g/dL (ref 30.0–36.0)
MCV: 96.4 fL (ref 78.0–100.0)
PLATELETS: 416 10*3/uL — AB (ref 150–400)
RBC: 2.8 MIL/uL — AB (ref 3.87–5.11)
RDW: 14.7 % (ref 11.5–15.5)
WBC: 8.8 10*3/uL (ref 4.0–10.5)

## 2017-04-10 LAB — COMPREHENSIVE METABOLIC PANEL
ALBUMIN: 2.5 g/dL — AB (ref 3.5–5.0)
ALT: 33 U/L (ref 14–54)
ANION GAP: 14 (ref 5–15)
AST: 97 U/L — ABNORMAL HIGH (ref 15–41)
Alkaline Phosphatase: 131 U/L — ABNORMAL HIGH (ref 38–126)
BUN: 12 mg/dL (ref 6–20)
CHLORIDE: 104 mmol/L (ref 101–111)
CO2: 16 mmol/L — AB (ref 22–32)
Calcium: 7.1 mg/dL — ABNORMAL LOW (ref 8.9–10.3)
Creatinine, Ser: 0.65 mg/dL (ref 0.44–1.00)
GFR calc non Af Amer: >60 mL/min (ref >60)
Glucose, Bld: 88 mg/dL (ref 65–99)
POTASSIUM: 3.7 mmol/L (ref 3.5–5.1)
SODIUM: 134 mmol/L — AB (ref 135–145)
Total Bilirubin: 1.2 mg/dL (ref 0.3–1.2)
Total Protein: 4.8 g/dL — ABNORMAL LOW (ref 6.5–8.1)

## 2017-04-10 LAB — GLUCOSE, CAPILLARY
Glucose-Capillary: 95 mg/dL (ref 65–99)
Glucose-Capillary: 104 mg/dL — ABNORMAL HIGH (ref 65–99)
Glucose-Capillary: 95 mg/dL (ref 65–99)
Glucose-Capillary: 114 mg/dL — ABNORMAL HIGH (ref 65–99)
Glucose-Capillary: 146 mg/dL — ABNORMAL HIGH (ref 65–99)

## 2017-04-10 MED ORDER — BOOST / RESOURCE BREEZE PO LIQD: 1.0000 | Freq: Three times a day (TID) | ORAL | Status: DC

## 2017-04-10 MED ORDER — KCL IN DEXTROSE-NACL 20-5-0.45 MEQ/L-%-% IV SOLN
INTRAVENOUS | Status: DC
  Administered 2017-04-10 – 2017-04-12 (×4): via INTRAVENOUS
  Filled 2017-04-10 (×4): qty 1000

## 2017-04-10 MED ORDER — SODIUM CHLORIDE 1 G PO TABS
1.0000 g | ORAL_TABLET | Freq: Once | ORAL | Status: AC
Start: 2017-04-10 — End: 2017-04-10
  Administered 2017-04-10: 1 g via ORAL
  Filled 2017-04-10: qty 1

## 2017-04-10 MED ORDER — NICOTINE 21 MG/24HR TD PT24
21.0000 mg | MEDICATED_PATCH | TRANSDERMAL | Status: DC
  Administered 2017-04-10 – 2017-04-12 (×3): 21 mg via TRANSDERMAL
  Filled 2017-04-10 (×3): qty 1

## 2017-04-10 MED ORDER — IOPAMIDOL (ISOVUE-300) INJECTION 61%
INTRAVENOUS | Status: AC
  Administered 2017-04-10: 100 mL
  Filled 2017-04-10: qty 100

## 2017-04-10 NOTE — Progress Notes (Signed)
PROGRESS NOTE    April [name]  ZOX:[id number] DOB: 1945/12/25 DOA: 04/09/2017 PCP: Lilia Argue    Brief Narrative:  71 y.o. woman with a history of HTN, HLD, anxiety/depression, tobacco and EtOH dependence, severe protein calorie malnutrition, chronic debility, peripheral neuropathy, and recurrent falls who presents to the ED for evaluation of headache, nausea, and vomiting after having an unwitnessed fall at home four days ago.   Assessment & Plan:   Principal Problem: Acute pancreatitis - Continue to treat symptoms. Bowel rest. Maintenance IV fluids    Hypokalemia - replaced  Hyponatremia - improving with normal saline administration - most likely due to poor oral solute intake - We'll administer sodium chloride tablet 1  Active Problems:   Alcohol abuse - counseled patient on abstinence    Tobacco abuse - Nicotine patch    Neuropathy, alcoholic (HCC) - Patient on folate and thiamine    Macrocytic anemia - Stable no active bleeding    Metabolic acidosis   Protein-calorie malnutrition, severe - Agree with dietitian but given pancreatitis will hold nutritional supplementation until patient is able to take oral intake    Frequent falls - We'll have physical therapy evaluate a suspect secondary to poor oral intake and alcohol these probably contributed to falls  DVT prophylaxis: SCD's Code Status: Full Family Communication: d/c daughter at bedside. Disposition Plan: tx to floor today   Consultants:   None   Procedures: None   Antimicrobials: None   Subjective: Pt has no new complaints. No acute issues overnight.  Objective: Vitals:   04/10/17 0500 04/10/17 0600 04/10/17 0900 04/10/17 1303  BP: (!) 102/56 110/60 121/71 112/68  Pulse: 82 90  79  Resp: 16 (!) Temp:   98.3 F (36.8 C) 98.7 F (37.1 C)  TempSrc:   Oral Oral  SpO2: 99% 99% 99% 100%  Weight:      Height:        Intake/Output Summary (Last 24 hours) at  04/10/17 1527 Last data filed at 04/10/17 1353  Gross per 24 hour  Intake          1143.33 ml  Output              600 ml  Net           543.33 ml   Filed Weights   04/09/17 1432 04/09/17 2039  Weight: 54.4 kg (120 lb) 45.8 kg (101 lb)    Examination:  General exam: Appears calm and comfortable, in nad. Respiratory system: Clear to auscultation. Respiratory effort normal. Cardiovascular system: S1 & S2 heard, RRR. No JVD, murmurs, rubs Gastrointestinal system: Abdomen is nondistended, soft and nontender. No organomegaly or masses felt. Normal bowel sounds heard. Central nervous system: Alert and oriented. No focal neurological deficits. Extremities: warm and dry Skin: No rashes, lesions or ulcers, on limited exam. Psychiatry:  Mood & affect appropriate.     Data Reviewed: I have personally reviewed following labs and imaging studies  CBC:  Recent Labs Lab 04/09/17 1550 04/10/17 0356  WBC 8.1 8.8  HGB 10.4* 9.2*  HCT 30.4* 27.0*  MCV 96.8 96.4  PLT 448* 416*   Basic Metabolic Panel:  Recent Labs Lab 04/09/17 1550 04/09/17 1753 04/09/17 2040 04/10/17 0356  NA 138  --  132* 134*  K 2.8*  --  3.3* 3.7  CL 101  --  97* 104  CO2 14*  --  18* 16*  GLUCOSE 59*  --  254* 88  BUN  22*  --  15 12  CREATININE 0.79  --  0.74 0.65  CALCIUM 7.9*  --  7.4* 7.1*  MG  --  1.3*  --   --    GFR: Estimated Creatinine Clearance: 48 mL/min (by C-G formula based on SCr of 0.65 mg/dL). Liver Function Tests:  Recent Labs Lab 04/09/17 1550 04/10/17 0356  AST 163* 97*  ALT 46 33  ALKPHOS 156* 131*  BILITOT 1.0 1.2  PROT 5.6* 4.8*  ALBUMIN 3.0* 2.5*    Recent Labs Lab 04/09/17 1550  LIPASE 79*   No results for input(s): AMMONIA in the last 168 hours. Coagulation Profile: No results for input(s): INR, PROTIME in the last 168 hours. Cardiac Enzymes: No results for input(s): CKTOTAL, CKMB, CKMBINDEX, TROPONINI in the last 168 hours. BNP (last 3 results) No results  for input(s): PROBNP in the last 8760 hours. HbA1C: No results for input(s): HGBA1C in the last 72 hours. CBG:  Recent Labs Lab 04/09/17 2359 04/10/17 0428 04/10/17 0916 04/10/17 1302  GLUCAP 122* 95 104* 95   Lipid Profile: No results for input(s): CHOL, HDL, LDLCALC, TRIG, CHOLHDL, LDLDIRECT in the last 72 hours. Thyroid Function Tests: No results for input(s): TSH, T4TOTAL, FREET4, T3FREE, THYROIDAB in the last 72 hours. Anemia Panel: No results for input(s): VITAMINB12, FOLATE, FERRITIN, TIBC, IRON, RETICCTPCT in the last 72 hours. Sepsis Labs:  Recent Labs Lab 04/09/17 1649 04/09/17 2040  LATICACIDVEN 2.03* 1.2    Recent Results (from the past 240 hour(s))  MRSA PCR Screening     Status: None   Collection Time: 04/09/17  9:00 PM  Result Value Ref Range Status   MRSA by PCR NEGATIVE NEGATIVE Final    Comment:        The GeneXpert MRSA Assay (FDA approved for NASAL specimens only), is one component of a comprehensive MRSA colonization surveillance program. It is not intended to diagnose MRSA infection nor to guide or monitor treatment for MRSA infections.          Radiology Studies: Ct Head Wo Contrast  Result Date: 04/09/2017 CLINICAL DATA:  Pain after fall.  Headache. EXAM: CT HEAD WITHOUT CONTRAST TECHNIQUE: Contiguous axial images were obtained from the base of the skull through the vertex without intravenous contrast. COMPARISON:  February 11, 2017 FINDINGS: Brain: No subdural,, epidural, or subarachnoid hemorrhage. Ventricular and sulcal prominence consistent with persistent atrophy. Cerebellum, brainstem, and basal cisterns are normal. No mass, mass effect, or midline shift. No acute cortical ischemia or infarct. Vascular: Calcified atherosclerosis is seen in the intra carotid arteries. Skull: No fractures are identified. Sinuses/Orbits: Mucosal thickening is seen posteriorly in the left maxillary sinus, unchanged since March of 2018. No acute paranasal  sinus abnormalities or changes. Mastoid air cells the middle ears are well aerated. Other: None. IMPRESSION: No acute intracranial abnormalities.  Generalized atrophy is stable. Electronically Signed   By: Gerome Sam III M.D   On: 04/09/2017 17:17   Ct Abdomen Pelvis W Contrast  Result Date: 04/10/2017 CLINICAL DATA:  71 year old female with abdominal pain. EXAM: CT ABDOMEN AND PELVIS WITH CONTRAST TECHNIQUE: Multidetector CT imaging of the abdomen and pelvis was performed using the standard protocol following bolus administration of intravenous contrast. CONTRAST:  ISOVUE-300 IOPAMIDOL (ISOVUE-300) INJECTION 61% COMPARISON:  Abdominal CT dated 01/30/2017 FINDINGS: Lower chest: Partially visualized small bilateral pleural effusions. There is minimal dependent atelectatic changes of the visualized lung bases. Coronary vascular calcification involving the RCA. No intra-abdominal free air.  Small ascites. Hepatobiliary:  There is fatty infiltration of the liver. There is elongated and somewhat enlarged appearance of the right lobe of the liver which may represent a riddle lobe variant but concerning for steatohepatitis. Correlation with clinical exam and LFTs recommended. There is no intrahepatic biliary ductal dilatation. Cholecystectomy. Pancreas: There is stranding and inflammatory changes of the peripancreatic fat most consistent with pancreatitis. Correlation with pancreatic enzymes recommended. No drainable fluid collection, abscess, or pseudocyst. Spleen: Normal in size without focal abnormality. Adrenals/Urinary Tract: Stable appearing 11 mm left adrenal nodule, indeterminate. The right adrenal gland appears unremarkable. The kidneys, visualized ureters, and urinary bladder appear unremarkable as well. There is symmetric and uniform uptake and enhancement of the kidney and excretion of contrast into the collecting system. Stomach/Bowel: There is extensive sigmoid diverticulosis with muscular  hypertrophy. No active inflammatory changes. There is diffusely thickened appearance of the colonic wall which may be reactive to acute pancreatitis or represent pancolitis. Correlation with clinical exam and stool cultures recommended. There is no evidence of bowel obstruction. The appendix is not visualized with certainty, possibly surgically absent. Vascular/Lymphatic: There is advanced aortoiliac atherosclerotic disease. The aorta is tortuous and ectatic measuring up to 2.5 cm in diameter inferior to the origin of the renal arteries. There is focal area of severe narrowing of the aortic lumen secondary to heavy atherosclerotic calcification just inferior to the level of the renal arteries. The distal aorta however remains patent. Chest there is advanced atherosclerotic calcification of the origins of the celiac axis, SMA, IMA with narrowing of the origin of these vessels. The mesenteric vessels however remain patent. There is also atherosclerotic disease of the renal ostia which remain patent. The IVC appear unremarkable. 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 This finding is new compared to the prior CT of 01/30/2017. The SMV, and the main portal vein are patent. No portal venous gas identified. There is no adenopathy. Reproductive: Probable left uterine fundal fibroid. The ovaries are grossly unremarkable. No pelvic mass. Other: None Musculoskeletal: Osteopenia with degenerative changes of the spine. No acute fracture. IMPRESSION: 1. Acute pancreatitis.  No abscess. 2. Thrombosis of the splenic vein, new since the prior CT, and sequela of pancreatitis. The spleen demonstrates a normal enhancement on today's CT. 3. Severe fatty infiltration of the liver with findings concerning for steatohepatitis. Correlation with clinical exam and LFTs recommended. 4. Reactive inflammation of the colon versus pancolitis.  Correlation with clinical exam and stool cultures recommended. No bowel obstruction. 5. Sigmoid diverticulosis without active inflammatory changes. 6. Small ascites. 7. Advanced atherosclerotic disease of the aorta with focal area of high-grade stenosis of the infrarenal abdominal aorta with a 2.5 cm ectatic segment, likely post stenotic ectasia. 8. Small bilateral pleural effusions. These results were called by telephone at the time of interpretation on 04/10/2017 at 3:41 am to Dr. Antionette Char, who verbally acknowledged these results. Electronically Signed   By: Elgie Collard M.D.   On: 04/10/2017 03:46   Dg Hip Unilat With Pelvis 2-3 Views Right  Result Date: 04/09/2017 CLINICAL DATA:  71 year old female with fall and right hip pain. EXAM: DG HIP (WITH OR WITHOUT PELVIS) 2-3V RIGHT COMPARISON:  None. FINDINGS: There is no acute fracture or dislocation. The bones are osteopenic. There are osteoarthritic changes of the hips. The soft tissues appear unremarkable. No radiopaque foreign object. IMPRESSION: No acute fracture or dislocation. Osteoarthritic changes of the hips. Electronically Signed   By:  Arash  RadparvarElgie Collard4/27/2018 23:13    Scheduled Meds: . folic acid  1 mg Oral Daily  . LORazepam  1 mg Intravenous Once  . multivitamin with minerals  1 tablet Oral Daily  . nicotine  21 mg Transdermal Once  . sodium chloride flush  3 mL Intravenous Q12H  . thiamine  100 mg Oral Daily   Continuous Infusions: . dextrose 5 % and 0.45 % NaCl with KCl 20 mEq/L 75 mL/hr at 04/10/17 1316  . famotidine (PEPCID) IV Stopped (04/10/17 1036)     LOS: 0 days    Time spent: > 35 minutes  Penny Pia, MD [facility] Pager [phone]  If 7PM-7AM, please contact night-coverage [email or web address] Password [facility] 04/10/2017, 3:27 PM

## 2017-04-10 NOTE — Progress Notes (Signed)
Patient voices concern over being told she should stop drinking by physician.  Pt states she cannot remember the last day she has gone without sipping Brandy throughout the day.  Daughter reports patient even drank a shot of liquor as EMS was on the way to get her d/t not wanting her daughter to call EMS for this admission.  Patient expresses concern over her ability to quit drinking.  Consult placed to social work to assist w/community support services for substance abuse.  Daughter also states patient smokes approx 1 PPD.  She voices concern that patient cannot stop smoking and drinking at the same time.

## 2017-04-10 NOTE — Progress Notes (Signed)
Initial Nutrition Assessment  DOCUMENTATION CODES:  Severe malnutrition in context of acute illness/injury, Underweight  INTERVENTION:  She is open to trying food at this time, Recommend diet advancement to clear liquids and then advance as tolerated  Boost Breeze po TID, each supplement provides 250 kcal and 9 grams of protein  MVI with minerals (may not tolerate on empty stomach)  NUTRITION DIAGNOSIS:  Malnutrition (Severe) related to social / environmental circumstances (etoh abuser) and resultant acute illness (Pancreatits) as evidenced by severe depletion of body fat, moderate to severe depletions of muscle mass, estimated PO intake that has met </=50% of needs for >/= 5 days and a wt loss of >10% bw in <1 month  GOAL:  Patient will meet greater than or equal to 90% of their needs  MONITOR:  PO intake, Supplement acceptance, Diet advancement, Labs  REASON FOR ASSESSMENT:  Consult Assessment of nutrition requirement/status  ASSESSMENT:  71 y/o female PMHx HTN, HLD, anxiety, depression, tobacco/etoh dependence, severe PCM, Chronic Debility, neuropathy, recurrent falls, GERD. Continues to "Sip brandy". Presented after having fall 5 days ago and hitting head. Has had nausea, vomiting, abdominal pain. Admitted for work up.   CT reveals acute pancreatitis and severe fatty infiltration of liver.  Pt reports that she has had such severe nausea, vomiting and diarrhea that event the though of food upset her. This has been going on for "about 1 month". Per H&P continues to drink. She says she did not take any vitamins or supplements at home.   She has not had any nausea, vomiting, or diarrhea since admitted.   She gives a UBW in the 120s. Per documented wt history, she weighed 127 lbs <1 month ago. She was dehydrated on admission. Today her bed weight is 109 lbs. Even used her rehydrated weight, Still a very significant wt loss for the time frame  At this time, RD asked if she would be  willing to try any food at all. She said she could try lighter items like toast, broth, jello. Open to trying the Boost Breeze. Certainly would benefit from a MVI w/ Min, but may upset if given on empty stomach.   NFPE: Severe fat depletion, most evident in arms and thorax, Mod-> Severe Muscle depletion, most evident in lower body  Labs: ETOH: 84 mg/dl, H/H:9.2/27, Na: 134, Albumin: 2.5, Mag: 1.3, BG: 90-120 Medications: Ativan, MVI with min, Thiamin, Folate, Pepcid, IVF NS, Mag sulf, kcl, Zofran   Recent Labs Lab 04/09/17 1550 04/09/17 1753 04/09/17 2040 04/10/17 0356  NA 138  --  132* 134*  K 2.8*  --  3.3* 3.7  CL 101  --  97* 104  CO2 14*  --  18* 16*  BUN 22*  --  15 12  CREATININE 0.79  --  0.74 0.65  CALCIUM 7.9*  --  7.4* 7.1*  MG  --  1.3*  --   --   GLUCOSE 59*  --  254* 88   Diet Order:  Diet NPO time specified  Skin:  Reviewed, no issues  Last BM:  Unknown-reported Diarrhea PTA  Height:  Ht Readings from Last 1 Encounters:  04/09/17 '5\' 6"'  (1.676 m)   Weight:  Wt Readings from Last 1 Encounters:  04/09/17 101 lb (45.8 kg)   Wt Readings from Last 10 Encounters:  04/09/17 101 lb (45.8 kg)  02/12/17 126 lb 14.4 oz (57.6 kg)  02/05/17 134 lb 7.7 oz (61 kg)  09/08/16 122 lb (55.3 kg)  04/20/16 116  lb 3.2 oz (52.7 kg)  12/05/15 126 lb 12.8 oz (57.5 kg)  09/13/15 122 lb 1.6 oz (55.4 kg)   Ideal Body Weight:  59.1 kg  BMI:  Body mass index is 16.3 kg/m. (17.7 using repleted weight)  Estimated Nutritional Needs:  Kcal:  1750-1950 (35-39 kcal/kg bw) Protein:  75-90g Pro (1.5-1.8 g/kg bw) Fluid:  >1.5 L (30 ml/kg bw)  EDUCATION NEEDS:  No education needs identified at this time  Burtis Junes RD, LDN, [facility] Pager: [phone] 04/10/2017 11:47 AM

## 2017-04-10 NOTE — Evaluation (Signed)
Physical Therapy Evaluation Patient Details Name: April [name] MRN: [id number] DOB: 1946-03-19 Today's Date: 04/10/2017   History of Present Illness  71 y.o. woman with a history of HTN, HLD, anxiety/depression, tobacco and EtOH dependence, severe protein calorie malnutrition, chronic debility, peripheral neuropathy, and recurrent falls who presents with hypokalemia, headache, nausea, and vomiting after having an unwitnessed fall at home four days ago.  Clinical Impression  Pt admitted with above complications. Pt currently with functional limitations due to the deficits listed below (see PT Problem List). Min assist to mobilize to edge of bed, however only required min guard to stand and ambulate up to 150 feet. HR up to 116, moderate SOB with SpO2 100% on room air. Due to falls and deconditioning patient will likely benefit from a brief HHPT consult for home safety at d/c, followed-by outpatient physical therapy. Pt will benefit from skilled PT to increase their independence and safety with mobility to allow discharge to the venue listed below.       Follow Up Recommendations Supervision for mobility/OOB;Home health PT (States daughter can assist at home)    Equipment Recommendations  None recommended by PT    Recommendations for Other Services       Precautions / Restrictions Precautions Precautions: Fall Restrictions Weight Bearing Restrictions: No      Mobility  Bed Mobility Overal bed mobility: Needs Assistance Bed Mobility: Supine to Sit;Sit to Supine     Supine to sit: Min assist Sit to supine: Supervision   General bed mobility comments: Min assist for truncal support to rise from bed. Able to mobilize legs independently. VC for technique and use of UEs as able.  Transfers Overall transfer level: Needs assistance Equipment used: Rolling walker (2 wheeled) Transfers: Sit to/from Stand Sit to Stand: Min guard         General transfer comment: Min guard for  safety. VC for hand placement and technique. Required rocking motion for momenteum when boosting to stand.  Ambulation/Gait Ambulation/Gait assistance: Min guard Ambulation Distance (Feet): 150 Feet Assistive device: Rolling walker (2 wheeled) Gait Pattern/deviations: Step-through pattern;Decreased stride length;Shuffle;Trunk flexed;Narrow base of support Gait velocity: slow Gait velocity interpretation: <1.8 ft/sec, indicative of risk for recurrent falls General Gait Details: Min guard for safety. HR up to 116, RR up to 30 briefly but avg in upper 20s with no change in Sats. VC for larger steps, upright posture, and walker placement for proximity. No buckling or significant instability noted with RW use.  Stairs            Wheelchair Mobility    Modified Rankin (Stroke Patients Only)       Balance Overall balance assessment: Needs assistance Sitting-balance support: No upper extremity supported;Feet supported Sitting balance-Leahy Scale: Good     Standing balance support: No upper extremity supported Standing balance-Leahy Scale: Fair                               Pertinent Vitals/Pain Pain Assessment: Faces Faces Pain Scale: Hurts even more Pain Location: head Pain Descriptors / Indicators: Aching Pain Intervention(s): Monitored during session;Repositioned;Premedicated before session    Home Living Family/patient expects to be discharged to:: Private residence Living Arrangements: Children Available Help at Discharge: Family;Available 24 hours/day Type of Home: House Home Access: Stairs to enter Entrance Stairs-Rails: Right;Left;Can reach both Entrance Stairs-Number of Steps: 1 Home Layout: One level Home Equipment: Walker - 2 wheels      Prior  Function Level of Independence: Independent with assistive device(s)               Hand Dominance   Dominant Hand: Right    Extremity/Trunk Assessment   Upper Extremity Assessment Upper  Extremity Assessment: Defer to OT evaluation    Lower Extremity Assessment Lower Extremity Assessment: Generalized weakness       Communication   Communication: No difficulties  Cognition Arousal/Alertness: Awake/alert Behavior During Therapy: WFL for tasks assessed/performed Overall Cognitive Status: Within Functional Limits for tasks assessed                                        General Comments General comments (skin integrity, edema, etc.): At rest - HR 82, SpO2 100% RA, RR 17, BP 121/71    Exercises General Exercises - Lower Extremity Ankle Circles/Pumps: AROM;Both;10 reps;Seated Long Arc Quad: Strengthening;Both;10 reps;Seated   Assessment/Plan    PT Assessment Patient needs continued PT services  PT Problem List Decreased strength;Decreased activity tolerance;Decreased balance;Decreased mobility;Decreased knowledge of use of DME;Pain       PT Treatment Interventions DME instruction;Gait training;Stair training;Functional mobility training;Therapeutic activities;Therapeutic exercise;Balance training;Patient/family education    PT Goals (Current goals can be found in the Care Plan section)  Acute Rehab PT Goals Patient Stated Goal: Feel better PT Goal Formulation: With patient Time For Goal Achievement: 04/24/17 Potential to Achieve Goals: Good    Frequency Min 3X/week   Barriers to discharge        Co-evaluation               End of Session Equipment Utilized During Treatment: Gait belt Activity Tolerance: Patient tolerated treatment well Patient left: in bed;with call bell/phone within reach;with bed alarm set;with family/visitor present Nurse Communication: Mobility status PT Visit Diagnosis: History of falling (Z91.81);Difficulty in walking, not elsewhere classified (R26.2);Pain Pain - part of body:  (head)    Time: 4098-1191 PT Time Calculation (min) (ACUTE ONLY): 26 min   Charges:   PT Evaluation $PT Eval Moderate  Complexity: 1 Procedure PT Treatments $Gait Training: 8-22 mins   PT G Codes:        Charlsie Merles, [place], DPT [phone]  [place] 04/10/2017, 9:47 AM

## 2017-04-11 LAB — GLUCOSE, CAPILLARY
GLUCOSE-CAPILLARY: 157 mg/dL — AB (ref 65–99)
Glucose-Capillary: 146 mg/dL — ABNORMAL HIGH (ref 65–99)
Glucose-Capillary: 145 mg/dL — ABNORMAL HIGH (ref 65–99)
Glucose-Capillary: 165 mg/dL — ABNORMAL HIGH (ref 65–99)

## 2017-04-11 MED ORDER — ZOLPIDEM TARTRATE 5 MG PO TABS
5.0000 mg | ORAL_TABLET | Freq: Every evening | ORAL | Status: DC | PRN
  Administered 2017-04-11 – 2017-04-12 (×2): 5 mg via ORAL
  Filled 2017-04-11 (×2): qty 1

## 2017-04-11 NOTE — Progress Notes (Signed)
PROGRESS NOTE    April [name]  BJY:[id number] DOB: 06/20/1946 DOA: 04/09/2017 PCP: Lilia Argue    Brief Narrative:  71 y.o. woman with a history of HTN, HLD, anxiety/depression, tobacco and EtOH dependence, severe protein calorie malnutrition, chronic debility, peripheral neuropathy, and recurrent falls who presents to the ED for evaluation of headache, nausea, and vomiting after having an unwitnessed fall at home four days ago.   Assessment & Plan:   Principal Problem: Acute pancreatitis - Continue to treat symptoms.  - advance diet to clears and as tolerated    Hypokalemia - replaced  Hyponatremia - improving with normal saline administration - most likely due to poor oral solute intake - We'll administer sodium chloride tablet 1  Active Problems:   Alcohol abuse - counseled patient on abstinence    Tobacco abuse - Nicotine patch    Neuropathy, alcoholic (HCC) - Patient on folate and thiamine    Macrocytic anemia - Stable no active bleeding    Metabolic acidosis   Protein-calorie malnutrition, severe - Agree with dietitian but given pancreatitis will hold nutritional supplementation until patient is able to take oral intake    Frequent falls - We'll have physical therapy evaluate a suspect secondary to poor oral intake and alcohol these probably contributed to falls  DVT prophylaxis: SCD's Code Status: Full Family Communication: d/c daughter at bedside. Disposition Plan: tx to floor today   Consultants:   None   Procedures: None   Antimicrobials: None   Subjective: Pt has no new complaints. Feels better  Objective: Vitals:   04/10/17 2152 04/11/17 0102 04/11/17 0423 04/11/17 1010  BP: 113/75  110/71 100/61  Pulse: 86  83 (!) 102  Resp: Temp: 97.7 F (36.5 C)  97.5 F (36.4 C) 98 F (36.7 C)  TempSrc: Oral  Oral Oral  SpO2: 100%  99% 98%  Weight:  45.8 kg (100 lb 15.9 oz)    Height:        Intake/Output  Summary (Last 24 hours) at 04/11/17 1411 Last data filed at 04/11/17 1006  Gross per 24 hour  Intake          1326.25 ml  Output              200 ml  Net          1126.25 ml   Filed Weights   04/09/17 1432 04/09/17 2039 04/11/17 0102  Weight: 54.4 kg (120 lb) 45.8 kg (101 lb) 45.8 kg (100 lb 15.9 oz)    Examination:  General exam: Appears calm and comfortable, in nad. Respiratory system: Clear to auscultation. Respiratory effort normal. Cardiovascular system: S1 & S2 heard, RRR. No JVD, murmurs, rubs Gastrointestinal system: Abdomen is nondistended, soft and nontender. No organomegaly or masses felt. Normal bowel sounds heard. Central nervous system: Alert and oriented. No focal neurological deficits. Extremities: warm and dry Skin: No rashes, lesions or ulcers, on limited exam. Psychiatry:  Mood & affect appropriate.   Data Reviewed: I have personally reviewed following labs and imaging studies  CBC:  Recent Labs Lab 04/09/17 1550 04/10/17 0356  WBC 8.1 8.8  HGB 10.4* 9.2*  HCT 30.4* 27.0*  MCV 96.8 96.4  PLT 448* 416*   Basic Metabolic Panel:  Recent Labs Lab 04/09/17 1550 04/09/17 1753 04/09/17 2040 04/10/17 0356  NA 138  --  132* 134*  K 2.8*  --  3.3* 3.7  CL 101  --  97* 104  CO2 14*  --  18* 16*  GLUCOSE 59*  --  254* 88  BUN 22*  --  15 12  CREATININE 0.79  --  0.74 0.65  CALCIUM 7.9*  --  7.4* 7.1*  MG  --  1.3*  --   --    GFR: Estimated Creatinine Clearance: 48 mL/min (by C-G formula based on SCr of 0.65 mg/dL). Liver Function Tests:  Recent Labs Lab 04/09/17 1550 04/10/17 0356  AST 163* 97*  ALT 46 33  ALKPHOS 156* 131*  BILITOT 1.0 1.2  PROT 5.6* 4.8*  ALBUMIN 3.0* 2.5*    Recent Labs Lab 04/09/17 1550  LIPASE 79*   No results for input(s): AMMONIA in the last 168 hours. Coagulation Profile: No results for input(s): INR, PROTIME in the last 168 hours. Cardiac Enzymes: No results for input(s): CKTOTAL, CKMB, CKMBINDEX,  TROPONINI in the last 168 hours. BNP (last 3 results) No results for input(s): PROBNP in the last 8760 hours. HbA1C: No results for input(s): HGBA1C in the last 72 hours. CBG:  Recent Labs Lab 04/10/17 2027 04/11/17 0003 04/11/17 0335 04/11/17 0759 04/11/17 1200  GLUCAP 146* 157* 146* 145* 165*   Lipid Profile: No results for input(s): CHOL, HDL, LDLCALC, TRIG, CHOLHDL, LDLDIRECT in the last 72 hours. Thyroid Function Tests: No results for input(s): TSH, T4TOTAL, FREET4, T3FREE, THYROIDAB in the last 72 hours. Anemia Panel: No results for input(s): VITAMINB12, FOLATE, FERRITIN, TIBC, IRON, RETICCTPCT in the last 72 hours. Sepsis Labs:  Recent Labs Lab 04/09/17 1649 04/09/17 2040  LATICACIDVEN 2.03* 1.2    Recent Results (from the past 240 hour(s))  MRSA PCR Screening     Status: None   Collection Time: 04/09/17  9:00 PM  Result Value Ref Range Status   MRSA by PCR NEGATIVE NEGATIVE Final    Comment:        The GeneXpert MRSA Assay (FDA approved for NASAL specimens only), is one component of a comprehensive MRSA colonization surveillance program. It is not intended to diagnose MRSA infection nor to guide or monitor treatment for MRSA infections.          Radiology Studies: Ct Head Wo Contrast  Result Date: 04/09/2017 CLINICAL DATA:  Pain after fall.  Headache. EXAM: CT HEAD WITHOUT CONTRAST TECHNIQUE: Contiguous axial images were obtained from the base of the skull through the vertex without intravenous contrast. COMPARISON:  February 11, 2017 FINDINGS: Brain: No subdural,, epidural, or subarachnoid hemorrhage. Ventricular and sulcal prominence consistent with persistent atrophy. Cerebellum, brainstem, and basal cisterns are normal. No mass, mass effect, or midline shift. No acute cortical ischemia or infarct. Vascular: Calcified atherosclerosis is seen in the intra carotid arteries. Skull: No fractures are identified. Sinuses/Orbits: Mucosal thickening is seen  posteriorly in the left maxillary sinus, unchanged since March of 2018. No acute paranasal sinus abnormalities or changes. Mastoid air cells the middle ears are well aerated. Other: None. IMPRESSION: No acute intracranial abnormalities.  Generalized atrophy is stable. Electronically Signed   By: Gerome Sam III M.D   On: 04/09/2017 17:17   Ct Abdomen Pelvis W Contrast  Result Date: 04/10/2017 CLINICAL DATA:  71 year old female with abdominal pain. EXAM: CT ABDOMEN AND PELVIS WITH CONTRAST TECHNIQUE: Multidetector CT imaging of the abdomen and pelvis was performed using the standard protocol following bolus administration of intravenous contrast. CONTRAST:  ISOVUE-300 IOPAMIDOL (ISOVUE-300) INJECTION 61% COMPARISON:  Abdominal CT dated 01/30/2017 FINDINGS: Lower chest: Partially visualized small bilateral pleural effusions. There is minimal dependent atelectatic changes of the visualized lung  bases. Coronary vascular calcification involving the RCA. No intra-abdominal free air.  Small ascites. Hepatobiliary: There is fatty infiltration of the liver. There is elongated and somewhat enlarged appearance of the right lobe of the liver which may represent a riddle lobe variant but concerning for steatohepatitis. Correlation with clinical exam and LFTs recommended. There is no intrahepatic biliary ductal dilatation. Cholecystectomy. Pancreas: There is stranding and inflammatory changes of the peripancreatic fat most consistent with pancreatitis. Correlation with pancreatic enzymes recommended. No drainable fluid collection, abscess, or pseudocyst. Spleen: Normal in size without focal abnormality. Adrenals/Urinary Tract: Stable appearing 11 mm left adrenal nodule, indeterminate. The right adrenal gland appears unremarkable. The kidneys, visualized ureters, and urinary bladder appear unremarkable as well. There is symmetric and uniform uptake and enhancement of the kidney and excretion of contrast into the  collecting system. Stomach/Bowel: There is extensive sigmoid diverticulosis with muscular hypertrophy. No active inflammatory changes. There is diffusely thickened appearance of the colonic wall which may be reactive to acute pancreatitis or represent pancolitis. Correlation with clinical exam and stool cultures recommended. There is no evidence of bowel obstruction. The appendix is not visualized with certainty, possibly surgically absent. Vascular/Lymphatic: There is advanced aortoiliac atherosclerotic disease. The aorta is tortuous and ectatic measuring up to 2.5 cm in diameter inferior to the origin of the renal arteries. There is focal area of severe narrowing of the aortic lumen secondary to heavy atherosclerotic calcification just inferior to the level of the renal arteries. The distal aorta however remains patent. Chest there is advanced atherosclerotic calcification of the origins of the celiac axis, SMA, IMA with narrowing of the origin of these vessels. The mesenteric vessels however remain patent. There is also atherosclerotic disease of the renal ostia which remain patent. The IVC appear unremarkable. 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 This finding is new compared to the prior CT of 01/30/2017. The SMV, and the main portal vein are patent. No portal venous gas identified. There is no adenopathy. Reproductive: Probable left uterine fundal fibroid. The ovaries are grossly unremarkable. No pelvic mass. Other: None Musculoskeletal: Osteopenia with degenerative changes of the spine. No acute fracture. IMPRESSION: 1. Acute pancreatitis.  No abscess. 2. Thrombosis of the splenic vein, new since the prior CT, and sequela of pancreatitis. The spleen demonstrates a normal enhancement on today's CT. 3. Severe fatty infiltration of the liver with findings concerning for steatohepatitis. Correlation with clinical  exam and LFTs recommended. 4. Reactive inflammation of the colon versus pancolitis. Correlation with clinical exam and stool cultures recommended. No bowel obstruction. 5. Sigmoid diverticulosis without active inflammatory changes. 6. Small ascites. 7. Advanced atherosclerotic disease of the aorta with focal area of high-grade stenosis of the infrarenal abdominal aorta with a 2.5 cm ectatic segment, likely post stenotic ectasia. 8. Small bilateral pleural effusions. These results were called by telephone at the time of interpretation on 04/10/2017 at 3:41 am to Dr. Antionette Char, who verbally acknowledged these results. Electronically Signed   By: Elgie Collard M.D.   On: 04/10/2017 03:46   Dg Hip Unilat With Pelvis 2-3 Views Right  Result Date: 04/09/2017 CLINICAL DATA:  71 year old female with fall and right hip pain. EXAM: DG HIP (WITH OR WITHOUT PELVIS) 2-3V RIGHT COMPARISON:  None. FINDINGS: There is no acute fracture or dislocation. The bones are osteopenic. There are osteoarthritic changes of the hips. The soft tissues appear unremarkable. No radiopaque foreign object. IMPRESSION:  No acute fracture or dislocation. Osteoarthritic changes of the hips. Electronically Signed   By: Elgie Collard M.D.   On: 04/09/2017 23:13    Scheduled Meds: . folic acid  1 mg Oral Daily  . LORazepam  1 mg Intravenous Once  . multivitamin with minerals  1 tablet Oral Daily  . nicotine  21 mg Transdermal Q24H  . sodium chloride flush  3 mL Intravenous Q12H  . thiamine  100 mg Oral Daily   Continuous Infusions: . dextrose 5 % and 0.45 % NaCl with KCl 20 mEq/L 75 mL/hr at 04/11/17 0336  . famotidine (PEPCID) IV Stopped (04/11/17 1006)     LOS: 1 day    Time spent: > 35 minutes  Penny Pia, MD [facility] Pager [phone]  If 7PM-7AM, please contact night-coverage [email or web address] Password [email or web address] 04/11/2017, 2:11 PM

## 2017-04-12 MED ORDER — ADULT MULTIVITAMIN W/MINERALS CH: 1.0000 | ORAL_TABLET | ORAL | Status: DC

## 2017-04-12 MED ORDER — LORAZEPAM 1 MG PO TABS: 1.0000 mg | ORAL_TABLET | Freq: Four times a day (QID) | ORAL | Status: DC | PRN

## 2017-04-12 MED ORDER — LORAZEPAM 2 MG/ML IJ SOLN
1.0000 mg | INTRAMUSCULAR | Status: DC | PRN
  Filled 2017-04-12: qty 1

## 2017-04-12 MED ORDER — LEVOFLOXACIN 500 MG PO TABS
500.0000 mg | ORAL_TABLET | Freq: Every day | ORAL | Status: DC
  Administered 2017-04-12 – 2017-04-13 (×2): 500 mg via ORAL
  Filled 2017-04-12 (×2): qty 1

## 2017-04-12 MED ORDER — LORAZEPAM 1 MG PO TABS: 0.0000 mg | ORAL_TABLET | Freq: Four times a day (QID) | ORAL | Status: DC

## 2017-04-12 MED ORDER — LORAZEPAM 1 MG PO TABS
1.0000 mg | ORAL_TABLET | ORAL | Status: DC | PRN
  Administered 2017-04-12: 2 mg via ORAL
  Administered 2017-04-13: 1 mg via ORAL
  Administered 2017-04-13: 2 mg via ORAL
  Filled 2017-04-12 (×2): qty 2
  Filled 2017-04-12: qty 1

## 2017-04-12 MED ORDER — LORAZEPAM 2 MG/ML IJ SOLN: 1.0000 mg | Freq: Four times a day (QID) | INTRAMUSCULAR | Status: DC | PRN

## 2017-04-12 MED ORDER — THIAMINE HCL 100 MG/ML IJ SOLN: 100.0000 mg | Freq: Every day | INTRAMUSCULAR | Status: DC

## 2017-04-12 MED ORDER — LORAZEPAM 1 MG PO TABS: 0.0000 mg | ORAL_TABLET | Freq: Two times a day (BID) | ORAL | Status: DC

## 2017-04-12 MED ORDER — LORAZEPAM 1 MG PO TABS
1.0000 mg | ORAL_TABLET | Freq: Four times a day (QID) | ORAL | Status: DC | PRN
  Administered 2017-04-12: 1 mg via ORAL
  Filled 2017-04-12: qty 1

## 2017-04-12 NOTE — Progress Notes (Signed)
PROGRESS NOTE    April [name]  GMW:[id number] DOB: April 02, 1946 DOA: 04/09/2017 PCP: Lilia Argue    Brief Narrative:  71 y.o. woman with a history of HTN, HLD, anxiety/depression, tobacco and EtOH dependence, severe protein calorie malnutrition, chronic debility, peripheral neuropathy, and recurrent falls who presents to the ED for evaluation of headache, nausea, and vomiting after having an unwitnessed fall at home four days ago.   Assessment & Plan:   Principal Problem: Acute pancreatitis - Continue to treat symptoms.  - advance diet to low fat diet. If tolerates will plan on d/c next am. - assess lipase levels next am.    Hypokalemia - replaced and wnl  Hyponatremia - reassess next am.  Active Problems:   Alcohol abuse - counseled patient on abstinence    Tobacco abuse - Nicotine patch    Neuropathy, alcoholic (HCC) - Patient on folate and thiamine    Macrocytic anemia - Stable no active bleeding    Metabolic acidosis   Protein-calorie malnutrition, severe - Agree with dietitian but given pancreatitis will hold nutritional supplementation until patient is able to take oral intake    Frequent falls - We'll have physical therapy evaluate a suspect secondary to poor oral intake and alcohol these probably contributed to falls  DVT prophylaxis: SCD's Code Status: Full Family Communication: d/c daughter at bedside. Disposition Plan: d/c most likely next am   Consultants:   None   Procedures: None   Antimicrobials: None   Subjective: Pt has no new complaints. Daughter concerned about discharge and was asking what she could do to help identify alcohol withdrawal symptoms. I discussed with her what she is to look for and encourage her to help her mother abstain from drinking alcohol.  Objective: Vitals:   04/11/17 1602 04/11/17 2205 04/12/17 0548 04/12/17 1338  BP: 111/68 128/71 123/75 116/81  Pulse: (!) 103 92 (!) 102 91  Resp: Temp: 98 F (36.7 C) 98.1 F (36.7 C) 98 F (36.7 C) 98 F (36.7 C)  TempSrc: Oral Oral Oral Oral  SpO2: 100% 98% 100% 100%  Weight:      Height:        Intake/Output Summary (Last 24 hours) at 04/12/17 1747 Last data filed at 04/12/17 0910  Gross per 24 hour  Intake              300 ml  Output              100 ml  Net              200 ml   Filed Weights   04/09/17 1432 04/09/17 2039 04/11/17 0102  Weight: 54.4 kg (120 lb) 45.8 kg (101 lb) 45.8 kg (100 lb 15.9 oz)    Examination:  General exam: Appears calm and comfortable, in nad. Respiratory system: Clear to auscultation. Respiratory effort normal. Cardiovascular system: S1 & S2 heard, RRR. No JVD, murmurs, rubs Gastrointestinal system: Abdomen is nondistended, soft and nontender. No organomegaly or masses felt. Normal bowel sounds heard. Central nervous system: Alert and oriented. No focal neurological deficits. Extremities: warm and dry Skin: No rashes, lesions or ulcers, on limited exam. Psychiatry:  Mood & affect appropriate.   Data Reviewed: I have personally reviewed following labs and imaging studies  CBC:  Recent Labs Lab 04/09/17 1550 04/10/17 0356  WBC 8.1 8.8  HGB 10.4* 9.2*  HCT 30.4* 27.0*  MCV 96.8 96.4  PLT 448* 416*   Basic  Metabolic Panel:  Recent Labs Lab 04/09/17 1550 04/09/17 1753 04/09/17 2040 04/10/17 0356  NA 138  --  132* 134*  K 2.8*  --  3.3* 3.7  CL 101  --  97* 104  CO2 14*  --  18* 16*  GLUCOSE 59*  --  254* 88  BUN 22*  --  15 12  CREATININE 0.79  --  0.74 0.65  CALCIUM 7.9*  --  7.4* 7.1*  MG  --  1.3*  --   --    GFR: Estimated Creatinine Clearance: 48 mL/min (by C-G formula based on SCr of 0.65 mg/dL). Liver Function Tests:  Recent Labs Lab 04/09/17 1550 04/10/17 0356  AST 163* 97*  ALT 46 33  ALKPHOS 156* 131*  BILITOT 1.0 1.2  PROT 5.6* 4.8*  ALBUMIN 3.0* 2.5*    Recent Labs Lab 04/09/17 1550  LIPASE 79*   No results for input(s): AMMONIA in  the last 168 hours. Coagulation Profile: No results for input(s): INR, PROTIME in the last 168 hours. Cardiac Enzymes: No results for input(s): CKTOTAL, CKMB, CKMBINDEX, TROPONINI in the last 168 hours. BNP (last 3 results) No results for input(s): PROBNP in the last 8760 hours. HbA1C: No results for input(s): HGBA1C in the last 72 hours. CBG:  Recent Labs Lab 04/10/17 2027 04/11/17 0003 04/11/17 0335 04/11/17 0759 04/11/17 1200  GLUCAP 146* 157* 146* 145* 165*   Lipid Profile: No results for input(s): CHOL, HDL, LDLCALC, TRIG, CHOLHDL, LDLDIRECT in the last 72 hours. Thyroid Function Tests: No results for input(s): TSH, T4TOTAL, FREET4, T3FREE, THYROIDAB in the last 72 hours. Anemia Panel: No results for input(s): VITAMINB12, FOLATE, FERRITIN, TIBC, IRON, RETICCTPCT in the last 72 hours. Sepsis Labs:  Recent Labs Lab 04/09/17 1649 04/09/17 2040  LATICACIDVEN 2.03* 1.2    Recent Results (from the past 240 hour(s))  MRSA PCR Screening     Status: None   Collection Time: 04/09/17  9:00 PM  Result Value Ref Range Status   MRSA by PCR NEGATIVE NEGATIVE Final    Comment:        The GeneXpert MRSA Assay (FDA approved for NASAL specimens only), is one component of a comprehensive MRSA colonization surveillance program. It is not intended to diagnose MRSA infection nor to guide or monitor treatment for MRSA infections.          Radiology Studies: No results found.  Scheduled Meds: . folic acid  1 mg Oral Daily  . levofloxacin  500 mg Oral Daily  . [START ON 04/13/2017] multivitamin with minerals  1 tablet Oral Q24H  . nicotine  21 mg Transdermal Q24H  . sodium chloride flush  3 mL Intravenous Q12H  . thiamine  100 mg Oral Daily   Continuous Infusions: . famotidine (PEPCID) IV Stopped (04/12/17 0939)     LOS: 2 days    Time spent: > 35 minutes  Penny Pia, MD [facility] Pager [phone]  If 7PM-7AM, please contact  night-coverage [email or web address] Password [facility] 04/12/2017, 5:47 PM

## 2017-04-12 NOTE — Progress Notes (Signed)
Physical Therapy Treatment Patient Details Name: April [name] MRN: [id number] DOB: [birth year] Today's Date: 04/12/2017    History of Present Illness 71 y.o. woman with a history of HTN, HLD, anxiety/depression, tobacco and EtOH dependence, severe protein calorie malnutrition, chronic debility, peripheral neuropathy, and recurrent falls who presents with hypokalemia, headache, nausea, and vomiting after having an unwitnessed fall at home four days ago.    PT Comments    Pt requires increased assist this treatment due to receiving ativan prior to treatment. Pt able to gait with RW and perform stair negotiation with RW with min A. Pt requires increased time for mobility but improving activity tolerance.   Follow Up Recommendations  Supervision for mobility/OOB;Home health PT     Equipment Recommendations  None recommended by PT    Recommendations for Other Services       Precautions / Restrictions Precautions Precautions: Fall Restrictions Weight Bearing Restrictions: No    Mobility  Bed Mobility   Bed Mobility: Supine to Sit;Sit to Supine     Supine to sit: Supervision     General bed mobility comments: pt requires increased time and use of bed rails but no physical assist  Transfers   Equipment used: Rolling walker (2 wheeled) Transfers: Sit to/from Stand Sit to Stand: Min assist         General transfer comment: min A for safety, cues for UE placement  Ambulation/Gait Ambulation/Gait assistance: Min assist Ambulation Distance (Feet): 150 Feet (x 2) Assistive device: Rolling walker (2 wheeled)   Gait velocity: slow   General Gait Details: Pt with increased sway, requiring assist for balance and for obstacle negotiation with RW.  cues for posture   Stairs Stairs: Yes   Stair Management: Two rails Number of Stairs: 2 General stair comments: 2 stairs in stairwell wiht min A for lifitng due to weakness  Wheelchair Mobility    Modified Rankin  (Stroke Patients Only)       Balance             Standing balance-Leahy Scale: Fair (able to stand statically with RW with supervision, min A for dynamic balance with RW)                              Cognition Arousal/Alertness: Awake/alert Behavior During Therapy: WFL for tasks assessed/performed Overall Cognitive Status: Within Functional Limits for tasks assessed                                        Exercises      General Comments        Pertinent Vitals/Pain Faces Pain Scale: Hurts little more Pain Location: "all over" Pain Intervention(s): Limited activity within patient's tolerance;Premedicated before session    Home Living                      Prior Function            PT Goals (current goals can now be found in the care plan section) Acute Rehab PT Goals Patient Stated Goal: Feel better PT Goal Formulation: With patient Time For Goal Achievement: 04/24/17 Potential to Achieve Goals: Good Progress towards PT goals: Progressing toward goals    Frequency    Min 3X/week      PT Plan Current plan remains appropriate    Co-evaluation  AM-PAC PT "6 Clicks" Daily Activity  Outcome Measure  Difficulty turning over in bed (including adjusting bedclothes, sheets and blankets)?: A Little Difficulty moving from lying on back to sitting on the side of the bed? : A Little Difficulty sitting down on and standing up from a chair with arms (e.g., wheelchair, bedside commode, etc,.)?: A Little Help needed moving to and from a bed to chair (including a wheelchair)?: A Little Help needed walking in hospital room?: None Help needed climbing 3-5 steps with a railing? : A Little 6 Click Score: 19    End of Session Equipment Utilized During Treatment: Gait belt Activity Tolerance: Patient tolerated treatment well Patient left: in bed;with call bell/phone within reach;with bed alarm set;with family/visitor  present Nurse Communication: Mobility status PT Visit Diagnosis: History of falling (Z91.81);Difficulty in walking, not elsewhere classified (R26.2);Pain     Time: 1610-9604 PT Time Calculation (min) (ACUTE ONLY): 32 min  Charges:  $Gait Training: 23-37 mins                    G Codes:       Reggy Eye, PT, DPT   Deborahann Poteat 04/12/2017, 11:49 AM

## 2017-04-12 NOTE — Consult Note (Signed)
   [facility] Inpatient Consult   04/12/2017  April [name] 08/06/1946 [id number]   Chart reviewed for frequent hospitalizations in the past 6 months as a beneficiary of [facility].  MD notes reveals the patient is a 71 y.o. woman with a history of HTN, HLD, anxiety/depression, tobacco and EtOH dependence, severe protein calorie malnutrition, chronic debility, peripheral neuropathy, and recurrent falls who presents with hypokalemia, headache, nausea, and vomiting after having an unwitnessed fall at home, prior to admission.  Met with the patient at the bedside.  Patient was alert and oriented to self and birthdate. Patient was given a brochure, magnet with 24 hour nurse advise line and contact information. Patient wanted information left at bedside. "I will look over it later."  Patient then states, "I know you don't want to and you probably can't help me with this..but I need to find my e-cigarettes."  Eplained to the patient that no smoking is allowed.  She then states, "maybe you can get someone to help me find it."  Nursing assistant came to the room. For questions, please contact:  Natividad Brood, RN BSN [facility] Liaison  [phone] business mobile phone Toll free office [phone]

## 2017-04-13 LAB — BASIC METABOLIC PANEL
Anion gap: 7 (ref 5–15)
CHLORIDE: 109 mmol/L (ref 101–111)
CO2: 24 mmol/L (ref 22–32)
Calcium: 7.3 mg/dL — ABNORMAL LOW (ref 8.9–10.3)
Creatinine, Ser: 0.33 mg/dL — ABNORMAL LOW (ref 0.44–1.00)
GFR calc Af Amer: >60 mL/min (ref >60)
GFR calc non Af Amer: >60 mL/min (ref >60)
GLUCOSE: 131 mg/dL — AB (ref 65–99)
POTASSIUM: 3 mmol/L — AB (ref 3.5–5.1)
Sodium: 140 mmol/L (ref 135–145)

## 2017-04-13 LAB — HEMOGLOBIN A1C
Hgb A1c MFr Bld: 4.6 % — ABNORMAL LOW (ref 4.8–5.6)
Mean Plasma Glucose: 85 mg/dL

## 2017-04-13 LAB — CBC
HEMATOCRIT: 26.6 % — AB (ref 36.0–46.0)
Hemoglobin: 9.2 g/dL — ABNORMAL LOW (ref 12.0–15.0)
MCH: 33.1 pg (ref 26.0–34.0)
MCHC: 34.6 g/dL (ref 30.0–36.0)
MCV: 95.7 fL (ref 78.0–100.0)
Platelets: 329 10*3/uL (ref 150–400)
RBC: 2.78 MIL/uL — ABNORMAL LOW (ref 3.87–5.11)
RDW: 15 % (ref 11.5–15.5)
WBC: 4.2 10*3/uL (ref 4.0–10.5)

## 2017-04-13 LAB — LIPASE, BLOOD: Lipase: 30 U/L (ref 11–51)

## 2017-04-13 MED ORDER — CHLORDIAZEPOXIDE HCL 25 MG PO CAPS: 25.0000 mg | ORAL_CAPSULE | Freq: Three times a day (TID) | ORAL | 0 refills | Status: AC | PRN

## 2017-04-13 MED ORDER — POTASSIUM CHLORIDE CRYS ER 20 MEQ PO TBCR
40.0000 meq | EXTENDED_RELEASE_TABLET | Freq: Once | ORAL | Status: AC
  Administered 2017-04-13: 40 meq via ORAL
  Filled 2017-04-13: qty 2

## 2017-04-13 MED ORDER — LEVOFLOXACIN 250 MG PO TABS: 250.0000 mg | ORAL_TABLET | Freq: Every day | ORAL | 0 refills | Status: AC

## 2017-04-13 MED ORDER — ADULT MULTIVITAMIN W/MINERALS CH: 1.0000 | ORAL_TABLET | ORAL | 0 refills | Status: AC

## 2017-04-13 NOTE — Progress Notes (Signed)
PT Cancellation Note  Patient Details Name: April [name] MRN: [id number] DOB: 1946/05/02   Cancelled Treatment:    Reason Eval/Treat Not Completed: Patient declined, no reason specified.  I'm supposed to be going home.  I don't want to today, thanks. 04/13/2017  [place] Bing, PT [phone]  (pager)   [name] 04/13/2017, 1:32 PM

## 2017-04-13 NOTE — Discharge Summary (Signed)
Physician Discharge Summary  April [name] ZOX:[id number] DOB: [birth year] DOA: 04/09/2017  PCP: Lilia Argue  Admit date: 04/09/2017 Discharge date: 04/13/2017  Time spent: > 35 minutes  Recommendations for Outpatient Follow-up:  1. Monitor potassium levels 2. Encourage alcohol cessation 3. Patient will be given 2 days worth of Librium and then can start taking Benadryl for alcohol withdrawals, she does have Klonopin for anxiety which can be started after use of Librium   Discharge Diagnoses:  Principal Problem:   Hypokalemia Active Problems:   Alcohol abuse   Tobacco abuse   Neuropathy, alcoholic (HCC)   Macrocytic anemia   Metabolic acidosis   Protein-calorie malnutrition, severe   Frequent falls   Discharge Condition: Stable  Diet recommendation: Heart healthy  Filed Weights   04/09/17 1432 04/09/17 2039 04/11/17 0102  Weight: 54.4 kg (120 lb) 45.8 kg (101 lb) 45.8 kg (100 lb 15.9 oz)    History of present illness:  71 y.o.woman with a history of HTN, HLD, anxiety/depression, tobacco and EtOH dependence, severe protein calorie malnutrition, chronic debility, peripheral neuropathy, and recurrent falls who presents to the ED for evaluation of headache, nausea, and vomiting after having an unwitnessed fall at home four days ago  Hospital Course:  Principal Problem: Acute pancreatitis - improved with supportive treatment and bowel rest.    Hypokalemia - replaced prior to d/c - will need to be rechecked as outpatient.  Hyponatremia - resolved on last check  Active Problems:   Alcohol abuse - counseled patient on abstinence    Tobacco abuse - Nicotine patch    Neuropathy, alcoholic (HCC) - Patient on folate and thiamine    Macrocytic anemia - Stable no active bleeding    Metabolic acidosis   Protein-calorie malnutrition, severe - Agree with dietitian recommend nutritional supplementation    Frequent falls - We'll have physical therapy  evaluate a suspect secondary to poor oral intake and alcohol these probably contributed to falls. No equipment recommended by PT.  Procedures:  None  Consultations:  None  Discharge Exam: Vitals:   04/13/17 0525 04/13/17 1324  BP: 105/68 93/69  Pulse: (!) 102 78  Resp: 16 18  Temp: 98.2 F (36.8 C) 97.5 F (36.4 C)    General: Pt in nad, alert and awake Cardiovascular: rrr, no rubs Respiratory: no increased wob, no wheezes  Discharge Instructions   Discharge Instructions    Call MD for:  persistant nausea and vomiting    Complete by:  As directed    Call MD for:  severe uncontrolled pain    Complete by:  As directed    Call MD for:  temperature >100.4    Complete by:  As directed    Diet - low sodium heart healthy    Complete by:  As directed    Discharge instructions    Complete by:  As directed    Please abstain from drinking alcohol. Do not take your Librium with your Klonopin or with alcohol. Discontinue opioid pain medication oxycodone. Please ensure follow-up with your primary care physician within the next one or 2 weeks   Increase activity slowly    Complete by:  As directed      Current Discharge Medication List    START taking these medications   Details  chlordiazePOXIDE (LIBRIUM) 25 MG capsule Take 1 capsule (25 mg total) by mouth 3 (three) times daily as needed for anxiety. Qty: 6 capsule, Refills: 0    levofloxacin (LEVAQUIN) 250 MG tablet Take 1 tablet (  250 mg total) by mouth daily. Qty: 5 tablet, Refills: 0    Multiple Vitamin (MULTIVITAMIN WITH MINERALS) TABS tablet Take 1 tablet by mouth daily. Qty: 30 tablet, Refills: 0      CONTINUE these medications which have NOT CHANGED   Details  amitriptyline (ELAVIL) 25 MG tablet Take 25 mg by mouth at bedtime as needed for sleep.  Refills: 0    gabapentin (NEURONTIN) 300 MG capsule Take 2 capsules (600 mg total) by mouth 3 (three) times daily. Qty: 540 capsule, Refills: 1    vitamin B-12  1000 MCG tablet Take 1 tablet (1,000 mcg total) by mouth daily. Qty: 30 tablet, Refills: 0    Vitamin D, Ergocalciferol, (DRISDOL) 50000 units CAPS capsule Take 50,000 Units by mouth once a week. Refills: 1      STOP taking these medications     clonazePAM (KLONOPIN) 0.5 MG tablet      ibuprofen (ADVIL,MOTRIN) 200 MG tablet      oxyCODONE-acetaminophen (ROXICET) 5-325 MG tablet        Allergies  Allergen Reactions  . Naltrexone Shortness Of Breath  . Penicillins Rash    Has patient had a PCN reaction causing immediate rash, facial/tongue/throat swelling, SOB or lightheadedness with hypotension: Yes immediate rash  Has patient had a PCN reaction causing severe rash involving mucus membranes or skin necrosis: Yes Has patient had a PCN reaction that required hospitalization": No Has patient had a PCN reaction occurring within the last 10 years: Yes If all of the above answers are "NO", then may proceed with Cephalosporin use.       The results of significant diagnostics from this hospitalization (including imaging, microbiology, ancillary and laboratory) are listed below for reference.    Significant Diagnostic Studies: Ct Head Wo Contrast  Result Date: 04/09/2017 CLINICAL DATA:  Pain after fall.  Headache. EXAM: CT HEAD WITHOUT CONTRAST TECHNIQUE: Contiguous axial images were obtained from the base of the skull through the vertex without intravenous contrast. COMPARISON:  February 11, 2017 FINDINGS: Brain: No subdural,, epidural, or subarachnoid hemorrhage. Ventricular and sulcal prominence consistent with persistent atrophy. Cerebellum, brainstem, and basal cisterns are normal. No mass, mass effect, or midline shift. No acute cortical ischemia or infarct. Vascular: Calcified atherosclerosis is seen in the intra carotid arteries. Skull: No fractures are identified. Sinuses/Orbits: Mucosal thickening is seen posteriorly in the left maxillary sinus, unchanged since March of 2018. No acute  paranasal sinus abnormalities or changes. Mastoid air cells the middle ears are well aerated. Other: None. IMPRESSION: No acute intracranial abnormalities.  Generalized atrophy is stable. Electronically Signed   By: Gerome Sam III M.D   On: 04/09/2017 17:17   Ct Abdomen Pelvis W Contrast  Result Date: 04/10/2017 CLINICAL DATA:  71 year old female with abdominal pain. EXAM: CT ABDOMEN AND PELVIS WITH CONTRAST TECHNIQUE: Multidetector CT imaging of the abdomen and pelvis was performed using the standard protocol following bolus administration of intravenous contrast. CONTRAST:  ISOVUE-300 IOPAMIDOL (ISOVUE-300) INJECTION 61% COMPARISON:  Abdominal CT dated 01/30/2017 FINDINGS: Lower chest: Partially visualized small bilateral pleural effusions. There is minimal dependent atelectatic changes of the visualized lung bases. Coronary vascular calcification involving the RCA. No intra-abdominal free air.  Small ascites. Hepatobiliary: There is fatty infiltration of the liver. There is elongated and somewhat enlarged appearance of the right lobe of the liver which may represent a riddle lobe variant but concerning for steatohepatitis. Correlation with clinical exam and LFTs recommended. There is no intrahepatic biliary ductal dilatation. Cholecystectomy. Pancreas:  There is stranding and inflammatory changes of the peripancreatic fat most consistent with pancreatitis. Correlation with pancreatic enzymes recommended. No drainable fluid collection, abscess, or pseudocyst. Spleen: Normal in size without focal abnormality. Adrenals/Urinary Tract: Stable appearing 11 mm left adrenal nodule, indeterminate. The right adrenal gland appears unremarkable. The kidneys, visualized ureters, and urinary bladder appear unremarkable as well. There is symmetric and uniform uptake and enhancement of the kidney and excretion of contrast into the collecting system. Stomach/Bowel: There is extensive sigmoid diverticulosis with  muscular hypertrophy. No active inflammatory changes. There is diffusely thickened appearance of the colonic wall which may be reactive to acute pancreatitis or represent pancolitis. Correlation with clinical exam and stool cultures recommended. There is no evidence of bowel obstruction. The appendix is not visualized with certainty, possibly surgically absent. Vascular/Lymphatic: There is advanced aortoiliac atherosclerotic disease. The aorta is tortuous and ectatic measuring up to 2.5 cm in diameter inferior to the origin of the renal arteries. There is focal area of severe narrowing of the aortic lumen secondary to heavy atherosclerotic calcification just inferior to the level of the renal arteries. The distal aorta however remains patent. Chest there is advanced atherosclerotic calcification of the origins of the celiac axis, SMA, IMA with narrowing of the origin of these vessels. The mesenteric vessels however remain patent. There is also atherosclerotic disease of the renal ostia which remain patent. The IVC appear unremarkable. 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 This finding is new compared to the prior CT of 01/30/2017. The SMV, and the main portal vein are patent. No portal venous gas identified. There is no adenopathy. Reproductive: Probable left uterine fundal fibroid. The ovaries are grossly unremarkable. No pelvic mass. Other: None Musculoskeletal: Osteopenia with degenerative changes of the spine. No acute fracture. IMPRESSION: 1. Acute pancreatitis.  No abscess. 2. Thrombosis of the splenic vein, new since the prior CT, and sequela of pancreatitis. The spleen demonstrates a normal enhancement on today's CT. 3. Severe fatty infiltration of the liver with findings concerning for steatohepatitis. Correlation with clinical exam and LFTs recommended. 4. Reactive inflammation of the colon versus  pancolitis. Correlation with clinical exam and stool cultures recommended. No bowel obstruction. 5. Sigmoid diverticulosis without active inflammatory changes. 6. Small ascites. 7. Advanced atherosclerotic disease of the aorta with focal area of high-grade stenosis of the infrarenal abdominal aorta with a 2.5 cm ectatic segment, likely post stenotic ectasia. 8. Small bilateral pleural effusions. These results were called by telephone at the time of interpretation on 04/10/2017 at 3:41 am to Dr. Antionette Char, who verbally acknowledged these results. Electronically Signed   By: Elgie Collard M.D.   On: 04/10/2017 03:46   Dg Hip Unilat With Pelvis 2-3 Views Right  Result Date: 04/09/2017 CLINICAL DATA:  71 year old female with fall and right hip pain. EXAM: DG HIP (WITH OR WITHOUT PELVIS) 2-3V RIGHT COMPARISON:  None. FINDINGS: There is no acute fracture or dislocation. The bones are osteopenic. There are osteoarthritic changes of the hips. The soft tissues appear unremarkable. No radiopaque foreign object. IMPRESSION: No acute fracture or dislocation. Osteoarthritic changes of the hips. Electronically Signed   By: Elgie Collard M.D.   On: 04/09/2017 23:13    Microbiology: Recent Results (from the past 240 hour(s))  MRSA PCR Screening     Status: None   Collection Time: 04/09/17  9:00 PM  Result Value Ref Range Status   MRSA by  PCR NEGATIVE NEGATIVE Final    Comment:        The GeneXpert MRSA Assay (FDA approved for NASAL specimens only), is one component of a comprehensive MRSA colonization surveillance program. It is not intended to diagnose MRSA infection nor to guide or monitor treatment for MRSA infections.      Labs: Basic Metabolic Panel:  Recent Labs Lab 04/09/17 1550 04/09/17 1753 04/09/17 2040 04/10/17 0356 04/13/17 0353  NA 138  --  132* 134* 140  K 2.8*  --  3.3* 3.7 3.0*  CL 101  --  97* 104 109  CO2 14*  --  18* 16* 24  GLUCOSE 59*  --  254* 88 131*  BUN 22*  --  15  12 <5*  CREATININE 0.79  --  0.74 0.65 0.33*  CALCIUM 7.9*  --  7.4* 7.1* 7.3*  MG  --  1.3*  --   --   --    Liver Function Tests:  Recent Labs Lab 04/09/17 1550 04/10/17 0356  AST 163* 97*  ALT 46 33  ALKPHOS 156* 131*  BILITOT 1.0 1.2  PROT 5.6* 4.8*  ALBUMIN 3.0* 2.5*    Recent Labs Lab 04/09/17 1550 04/13/17 0353  LIPASE 79* 30   No results for input(s): AMMONIA in the last 168 hours. CBC:  Recent Labs Lab 04/09/17 1550 04/10/17 0356 04/13/17 0353  WBC 8.1 8.8 4.2  HGB 10.4* 9.2* 9.2*  HCT 30.4* 27.0* 26.6*  MCV 96.8 96.4 95.7  PLT 448* 416* 329   Cardiac Enzymes: No results for input(s): CKTOTAL, CKMB, CKMBINDEX, TROPONINI in the last 168 hours. BNP: BNP (last 3 results) No results for input(s): BNP in the last 8760 hours.  ProBNP (last 3 results) No results for input(s): PROBNP in the last 8760 hours.  CBG:  Recent Labs Lab 04/10/17 2027 04/11/17 0003 04/11/17 0335 04/11/17 0759 04/11/17 1200  GLUCAP 146* 157* 146* 145* 165*       Signed:  Penny Pia MD.  [facility] 04/13/2017, 2:52 PM

## 2017-04-13 NOTE — Progress Notes (Addendum)
CSW received consult regarding substance use. CSWs asked patient's daughter to step out of the room, which patient agreed with. Patient reported that she feels like she has an issue with the amount of alcohol she uses. She reported drinking a sip of brandy throughout the day. She stated that she would like resources to stop drinking and was grateful for CSW's help. CSW completed SBIRT with patient and provided inpatient and outpatient resources.  CSW signing off.  Osborne Casco Jentri Aye LCSWA [phone]

## 2017-04-13 NOTE — Progress Notes (Signed)
Pt and daughter given discharge instructions, prescriptions, and care notes. Pt verbalized understanding AEB no further questions or concerns at this time. IV was discontinued, no redness, pain, or swelling noted at this time. Telemetry discontinued and Centralized Telemetry was notified. Pt left the floor via wheelchair with staff in stable condition. 

## 2017-04-20 DIAGNOSIS — G621 Alcoholic polyneuropathy: Secondary | ICD-10-CM | POA: Diagnosis not present

## 2017-04-20 DIAGNOSIS — R748 Abnormal levels of other serum enzymes: Secondary | ICD-10-CM | POA: Diagnosis not present

## 2017-04-20 DIAGNOSIS — K852 Alcohol induced acute pancreatitis without necrosis or infection: Secondary | ICD-10-CM | POA: Diagnosis not present

## 2017-04-20 DIAGNOSIS — W19XXXD Unspecified fall, subsequent encounter: Secondary | ICD-10-CM | POA: Diagnosis not present

## 2017-04-20 DIAGNOSIS — D539 Nutritional anemia, unspecified: Secondary | ICD-10-CM | POA: Diagnosis not present

## 2017-04-20 DIAGNOSIS — F1010 Alcohol abuse, uncomplicated: Secondary | ICD-10-CM | POA: Diagnosis not present

## 2017-04-20 DIAGNOSIS — E46 Unspecified protein-calorie malnutrition: Secondary | ICD-10-CM | POA: Diagnosis not present

## 2017-04-20 DIAGNOSIS — Z720 Tobacco use: Secondary | ICD-10-CM | POA: Diagnosis not present

## 2017-04-20 DIAGNOSIS — F419 Anxiety disorder, unspecified: Secondary | ICD-10-CM | POA: Diagnosis not present

## 2017-05-14 NOTE — Addendum Note (Signed)
Addendum  created 05/14/17 1023 by Alechia Lezama D, MD   Sign clinical note    

## 2017-06-11 ENCOUNTER — Other Ambulatory Visit: Payer: Self-pay | Admitting: Neurology

## 2017-08-28 ENCOUNTER — Other Ambulatory Visit: Payer: Self-pay | Admitting: Neurology

## 2017-09-20 ENCOUNTER — Other Ambulatory Visit: Payer: Self-pay | Admitting: Neurology

## 2017-10-04 ENCOUNTER — Inpatient Hospital Stay (HOSPITAL_COMMUNITY)
Admission: EM | Admit: 2017-10-04 | Discharge: 2017-10-08 | DRG: 438 | Disposition: A | Discharge status: Home / Self Care | Payer: PPO | Attending: Internal Medicine | Admitting: Internal Medicine

## 2017-10-04 ENCOUNTER — Encounter (HOSPITAL_COMMUNITY): Payer: Self-pay | Admitting: Emergency Medicine

## 2017-10-04 ENCOUNTER — Emergency Department (HOSPITAL_COMMUNITY): Payer: PPO

## 2017-10-04 DIAGNOSIS — Z880 Allergy status to penicillin: Secondary | ICD-10-CM

## 2017-10-04 DIAGNOSIS — Z681 Body mass index (BMI) 19 or less, adult: Secondary | ICD-10-CM | POA: Diagnosis not present

## 2017-10-04 DIAGNOSIS — Z720 Tobacco use: Secondary | ICD-10-CM | POA: Diagnosis not present

## 2017-10-04 DIAGNOSIS — G621 Alcoholic polyneuropathy: Secondary | ICD-10-CM | POA: Diagnosis present

## 2017-10-04 DIAGNOSIS — F419 Anxiety disorder, unspecified: Secondary | ICD-10-CM | POA: Diagnosis not present

## 2017-10-04 DIAGNOSIS — E43 Unspecified severe protein-calorie malnutrition: Secondary | ICD-10-CM | POA: Diagnosis not present

## 2017-10-04 DIAGNOSIS — F1010 Alcohol abuse, uncomplicated: Secondary | ICD-10-CM | POA: Diagnosis present

## 2017-10-04 DIAGNOSIS — I81 Portal vein thrombosis: Secondary | ICD-10-CM | POA: Diagnosis present

## 2017-10-04 DIAGNOSIS — K859 Acute pancreatitis without necrosis or infection, unspecified: Secondary | ICD-10-CM | POA: Diagnosis not present

## 2017-10-04 DIAGNOSIS — I10 Essential (primary) hypertension: Secondary | ICD-10-CM | POA: Diagnosis present

## 2017-10-04 DIAGNOSIS — Z9181 History of falling: Secondary | ICD-10-CM | POA: Diagnosis not present

## 2017-10-04 DIAGNOSIS — R109 Unspecified abdominal pain: Secondary | ICD-10-CM | POA: Diagnosis not present

## 2017-10-04 DIAGNOSIS — D539 Nutritional anemia, unspecified: Secondary | ICD-10-CM | POA: Diagnosis not present

## 2017-10-04 DIAGNOSIS — Z79899 Other long term (current) drug therapy: Secondary | ICD-10-CM

## 2017-10-04 DIAGNOSIS — F17210 Nicotine dependence, cigarettes, uncomplicated: Secondary | ICD-10-CM | POA: Diagnosis present

## 2017-10-04 DIAGNOSIS — Z888 Allergy status to other drugs, medicaments and biological substances status: Secondary | ICD-10-CM

## 2017-10-04 DIAGNOSIS — K852 Alcohol induced acute pancreatitis without necrosis or infection: Secondary | ICD-10-CM | POA: Diagnosis not present

## 2017-10-04 DIAGNOSIS — E785 Hyperlipidemia, unspecified: Secondary | ICD-10-CM | POA: Diagnosis present

## 2017-10-04 DIAGNOSIS — K219 Gastro-esophageal reflux disease without esophagitis: Secondary | ICD-10-CM | POA: Diagnosis present

## 2017-10-04 LAB — COMPREHENSIVE METABOLIC PANEL
ALBUMIN: 2.5 g/dL — AB (ref 3.5–5.0)
ALT: 16 U/L (ref 14–54)
AST: 63 U/L — AB (ref 15–41)
Alkaline Phosphatase: 158 U/L — ABNORMAL HIGH (ref 38–126)
Anion gap: 10 (ref 5–15)
BUN: 11 mg/dL (ref 6–20)
CALCIUM: 8.5 mg/dL — AB (ref 8.9–10.3)
CO2: 24 mmol/L (ref 22–32)
Chloride: 105 mmol/L (ref 101–111)
Creatinine, Ser: 0.49 mg/dL (ref 0.44–1.00)
GFR calc Af Amer: >60 mL/min (ref >60)
GFR calc non Af Amer: >60 mL/min (ref >60)
GLUCOSE: 95 mg/dL (ref 65–99)
POTASSIUM: 4.4 mmol/L (ref 3.5–5.1)
SODIUM: 139 mmol/L (ref 135–145)
TOTAL PROTEIN: 5.3 g/dL — AB (ref 6.5–8.1)
Total Bilirubin: 1 mg/dL (ref 0.3–1.2)

## 2017-10-04 LAB — CBC
HCT: 37.1 % (ref 36.0–46.0)
HEMOGLOBIN: 12.7 g/dL (ref 12.0–15.0)
MCH: 36.7 pg — ABNORMAL HIGH (ref 26.0–34.0)
MCHC: 34.2 g/dL (ref 30.0–36.0)
MCV: 107.2 fL — ABNORMAL HIGH (ref 78.0–100.0)
PLATELETS: 385 10*3/uL (ref 150–400)
RBC: 3.46 MIL/uL — AB (ref 3.87–5.11)
RDW: 16.2 % — ABNORMAL HIGH (ref 11.5–15.5)
WBC: 9.6 10*3/uL (ref 4.0–10.5)

## 2017-10-04 LAB — URINALYSIS, ROUTINE W REFLEX MICROSCOPIC
Bilirubin Urine: NEGATIVE
Glucose, UA: NEGATIVE mg/dL
Hgb urine dipstick: NEGATIVE
Ketones, ur: 5 mg/dL — AB
LEUKOCYTES UA: NEGATIVE
Nitrite: NEGATIVE
PROTEIN: NEGATIVE mg/dL
SPECIFIC GRAVITY, URINE: 1.016 (ref 1.005–1.030)
pH: 5 (ref 5.0–8.0)

## 2017-10-04 LAB — LIPASE, BLOOD: LIPASE: 129 U/L — AB (ref 11–51)

## 2017-10-04 LAB — POC OCCULT BLOOD, ED: FECAL OCCULT BLD: NEGATIVE

## 2017-10-04 MED ORDER — ONDANSETRON HCL 4 MG/2ML IJ SOLN
4.0000 mg | Freq: Once | INTRAMUSCULAR | Status: AC
  Administered 2017-10-04: 4 mg via INTRAVENOUS
  Filled 2017-10-04: qty 2

## 2017-10-04 MED ORDER — FENTANYL CITRATE (PF) 100 MCG/2ML IJ SOLN
50.0000 ug | Freq: Once | INTRAMUSCULAR | Status: AC
  Administered 2017-10-04: 50 ug via INTRAVENOUS
  Filled 2017-10-04: qty 2

## 2017-10-04 MED ORDER — IOPAMIDOL (ISOVUE-300) INJECTION 61%
30.0000 mL | Freq: Once | INTRAVENOUS | Status: AC | PRN
  Administered 2017-10-04: 30 mL via ORAL

## 2017-10-04 MED ORDER — SODIUM CHLORIDE 0.9 % IV BOLUS (SEPSIS)
500.0000 mL | Freq: Once | INTRAVENOUS | Status: AC
  Administered 2017-10-04: 500 mL via INTRAVENOUS

## 2017-10-04 MED ORDER — IOPAMIDOL (ISOVUE-300) INJECTION 61%
100.0000 mL | Freq: Once | INTRAVENOUS | Status: AC | PRN
  Administered 2017-10-04: 100 mL via INTRAVENOUS

## 2017-10-04 MED ORDER — IOPAMIDOL (ISOVUE-300) INJECTION 61%
INTRAVENOUS | Status: AC
  Filled 2017-10-04: qty 100

## 2017-10-04 MED ORDER — MORPHINE SULFATE (PF) 4 MG/ML IV SOLN
4.0000 mg | Freq: Once | INTRAVENOUS | Status: AC
  Administered 2017-10-04: 4 mg via INTRAVENOUS
  Filled 2017-10-04: qty 1

## 2017-10-04 NOTE — ED Notes (Signed)
Daughter Leretha DykesBobye phone number [phone]

## 2017-10-04 NOTE — ED Triage Notes (Signed)
Per EMS-states RUQ pain since yesterday-states she drank 2 shots of brandy today-states she was evicted from home yesterday-history of pancreatitis-patient requesting pain meds once she arrived to ED-no N/V

## 2017-10-04 NOTE — ED Provider Notes (Signed)
Medical screening examination/treatment/procedure(s) were conducted as a shared visit with non-physician practitioner(s) and myself.  I personally evaluated the patient during the encounter.  RLQ abdominal pain that is sharp and sometimes burning in nature. Different than previous shingles. Has had some nausea, one episode of vomiting. Pain worsening over 24 hours.  Exam with some guarding but no peritonitis. Hr normal (elevated in triage), normal BP. No rash. Cardiac exam normal otherwise. Lung ctab.  Will eval for appendicitis/stone/colitis/R sided diverticulitis.     Marily MemosMesner, Naydeline Morace, MD 10/11/17 1036

## 2017-10-04 NOTE — ED Notes (Signed)
Bed: WA23 Expected date:  Expected time:  Means of arrival:  Comments: Hold for triage 2 

## 2017-10-05 DIAGNOSIS — K859 Acute pancreatitis without necrosis or infection, unspecified: Secondary | ICD-10-CM | POA: Diagnosis present

## 2017-10-05 DIAGNOSIS — D539 Nutritional anemia, unspecified: Secondary | ICD-10-CM

## 2017-10-05 DIAGNOSIS — Z681 Body mass index (BMI) 19 or less, adult: Secondary | ICD-10-CM | POA: Diagnosis not present

## 2017-10-05 DIAGNOSIS — Z880 Allergy status to penicillin: Secondary | ICD-10-CM | POA: Diagnosis not present

## 2017-10-05 DIAGNOSIS — E785 Hyperlipidemia, unspecified: Secondary | ICD-10-CM | POA: Diagnosis present

## 2017-10-05 DIAGNOSIS — Z9181 History of falling: Secondary | ICD-10-CM | POA: Diagnosis not present

## 2017-10-05 DIAGNOSIS — F419 Anxiety disorder, unspecified: Secondary | ICD-10-CM | POA: Diagnosis present

## 2017-10-05 DIAGNOSIS — K852 Alcohol induced acute pancreatitis without necrosis or infection: Secondary | ICD-10-CM | POA: Diagnosis not present

## 2017-10-05 DIAGNOSIS — F17210 Nicotine dependence, cigarettes, uncomplicated: Secondary | ICD-10-CM | POA: Diagnosis present

## 2017-10-05 DIAGNOSIS — I10 Essential (primary) hypertension: Secondary | ICD-10-CM | POA: Diagnosis present

## 2017-10-05 DIAGNOSIS — G621 Alcoholic polyneuropathy: Secondary | ICD-10-CM | POA: Diagnosis present

## 2017-10-05 DIAGNOSIS — E43 Unspecified severe protein-calorie malnutrition: Secondary | ICD-10-CM | POA: Diagnosis present

## 2017-10-05 DIAGNOSIS — I81 Portal vein thrombosis: Secondary | ICD-10-CM | POA: Diagnosis present

## 2017-10-05 DIAGNOSIS — Z720 Tobacco use: Secondary | ICD-10-CM

## 2017-10-05 DIAGNOSIS — F1010 Alcohol abuse, uncomplicated: Secondary | ICD-10-CM | POA: Diagnosis present

## 2017-10-05 DIAGNOSIS — K861 Other chronic pancreatitis: Secondary | ICD-10-CM

## 2017-10-05 DIAGNOSIS — Z888 Allergy status to other drugs, medicaments and biological substances status: Secondary | ICD-10-CM | POA: Diagnosis not present

## 2017-10-05 DIAGNOSIS — K219 Gastro-esophageal reflux disease without esophagitis: Secondary | ICD-10-CM | POA: Diagnosis present

## 2017-10-05 DIAGNOSIS — Z79899 Other long term (current) drug therapy: Secondary | ICD-10-CM | POA: Diagnosis not present

## 2017-10-05 LAB — COMPREHENSIVE METABOLIC PANEL
ALK PHOS: 171 U/L — AB (ref 38–126)
ALT: 21 U/L (ref 14–54)
AST: 53 U/L — AB (ref 15–41)
Albumin: 2.7 g/dL — ABNORMAL LOW (ref 3.5–5.0)
Anion gap: 10 (ref 5–15)
BILIRUBIN TOTAL: 1.1 mg/dL (ref 0.3–1.2)
BUN: 10 mg/dL (ref 6–20)
CALCIUM: 8.2 mg/dL — AB (ref 8.9–10.3)
CO2: 26 mmol/L (ref 22–32)
CREATININE: 0.43 mg/dL — AB (ref 0.44–1.00)
Chloride: 101 mmol/L (ref 101–111)
Glucose, Bld: 87 mg/dL (ref 65–99)
Potassium: 3.4 mmol/L — ABNORMAL LOW (ref 3.5–5.1)
Sodium: 137 mmol/L (ref 135–145)
TOTAL PROTEIN: 5.6 g/dL — AB (ref 6.5–8.1)

## 2017-10-05 LAB — APTT: aPTT: 30 seconds (ref 24–36)

## 2017-10-05 LAB — PROTIME-INR
INR: 1.07
Prothrombin Time: 13.8 seconds (ref 11.4–15.2)

## 2017-10-05 LAB — VITAMIN B12: VITAMIN B 12: 459 pg/mL (ref 180–914)

## 2017-10-05 LAB — CBC
HEMATOCRIT: 35.7 % — AB (ref 36.0–46.0)
HEMOGLOBIN: 11.8 g/dL — AB (ref 12.0–15.0)
MCH: 36 pg — AB (ref 26.0–34.0)
MCHC: 33.1 g/dL (ref 30.0–36.0)
MCV: 108.8 fL — AB (ref 78.0–100.0)
Platelets: 318 10*3/uL (ref 150–400)
RBC: 3.28 MIL/uL — AB (ref 3.87–5.11)
RDW: 15.4 % (ref 11.5–15.5)
WBC: 6 10*3/uL (ref 4.0–10.5)

## 2017-10-05 LAB — PREALBUMIN: Prealbumin: 12.6 mg/dL — ABNORMAL LOW (ref 18–38)

## 2017-10-05 MED ORDER — ACETAMINOPHEN 650 MG RE SUPP: 650.0000 mg | Freq: Four times a day (QID) | RECTAL | Status: DC | PRN

## 2017-10-05 MED ORDER — ACETAMINOPHEN 325 MG PO TABS
650.0000 mg | ORAL_TABLET | Freq: Four times a day (QID) | ORAL | Status: DC | PRN
Start: 2017-10-05 — End: 2017-10-08
  Administered 2017-10-05 – 2017-10-08 (×3): 650 mg via ORAL
  Filled 2017-10-05 (×2): qty 2

## 2017-10-05 MED ORDER — NICOTINE 21 MG/24HR TD PT24
21.0000 mg | MEDICATED_PATCH | Freq: Every day | TRANSDERMAL | Status: DC
  Administered 2017-10-05 – 2017-10-08 (×4): 21 mg via TRANSDERMAL
  Filled 2017-10-05 (×4): qty 1

## 2017-10-05 MED ORDER — FOLIC ACID 1 MG PO TABS
1.0000 mg | ORAL_TABLET | Freq: Every day | ORAL | Status: DC
  Administered 2017-10-05 – 2017-10-08 (×4): 1 mg via ORAL
  Filled 2017-10-05 (×4): qty 1

## 2017-10-05 MED ORDER — THIAMINE HCL 100 MG/ML IJ SOLN
100.0000 mg | Freq: Every day | INTRAMUSCULAR | Status: DC
  Administered 2017-10-06 – 2017-10-07 (×2): 100 mg via INTRAVENOUS
  Filled 2017-10-05 (×2): qty 2

## 2017-10-05 MED ORDER — ONDANSETRON HCL 4 MG/2ML IJ SOLN
4.0000 mg | Freq: Four times a day (QID) | INTRAMUSCULAR | Status: DC | PRN
  Administered 2017-10-05 – 2017-10-07 (×3): 4 mg via INTRAVENOUS
  Filled 2017-10-05 (×3): qty 2

## 2017-10-05 MED ORDER — ENOXAPARIN SODIUM 60 MG/0.6ML ~~LOC~~ SOLN
1.0000 mg/kg | Freq: Two times a day (BID) | SUBCUTANEOUS | Status: DC
  Administered 2017-10-05 – 2017-10-08 (×7): 50 mg via SUBCUTANEOUS
  Filled 2017-10-05 (×4): qty 0.6
  Filled 2017-10-05: qty 0.5
  Filled 2017-10-05 (×3): qty 0.6

## 2017-10-05 MED ORDER — LORAZEPAM 2 MG/ML IJ SOLN
0.0000 mg | Freq: Two times a day (BID) | INTRAMUSCULAR | Status: DC
  Administered 2017-10-07 (×2): 2 mg via INTRAVENOUS
  Filled 2017-10-05 (×2): qty 1

## 2017-10-05 MED ORDER — NICOTINE 14 MG/24HR TD PT24
14.0000 mg | MEDICATED_PATCH | Freq: Once | TRANSDERMAL | Status: DC
  Administered 2017-10-05: 14 mg via TRANSDERMAL
  Filled 2017-10-05: qty 1

## 2017-10-05 MED ORDER — SODIUM CHLORIDE 0.9 % IV SOLN
INTRAVENOUS | Status: DC
  Administered 2017-10-05: 03:00:00 via INTRAVENOUS

## 2017-10-05 MED ORDER — VITAMIN B-1 100 MG PO TABS
100.0000 mg | ORAL_TABLET | Freq: Every day | ORAL | Status: DC
  Administered 2017-10-05 – 2017-10-08 (×2): 100 mg via ORAL
  Filled 2017-10-05 (×3): qty 1

## 2017-10-05 MED ORDER — ENSURE ENLIVE PO LIQD
237.0000 mL | Freq: Two times a day (BID) | ORAL | Status: DC
  Administered 2017-10-08: 237 mL via ORAL

## 2017-10-05 MED ORDER — LORAZEPAM 2 MG/ML IJ SOLN
0.0000 mg | Freq: Four times a day (QID) | INTRAMUSCULAR | Status: AC
  Administered 2017-10-06: 1 mg via INTRAVENOUS
  Administered 2017-10-06: 2 mg via INTRAVENOUS
  Filled 2017-10-05 (×3): qty 1

## 2017-10-05 MED ORDER — LORAZEPAM 1 MG PO TABS
1.0000 mg | ORAL_TABLET | Freq: Four times a day (QID) | ORAL | Status: AC | PRN
  Administered 2017-10-06: 1 mg via ORAL
  Filled 2017-10-05: qty 1

## 2017-10-05 MED ORDER — LORAZEPAM 2 MG/ML IJ SOLN
1.0000 mg | Freq: Four times a day (QID) | INTRAMUSCULAR | Status: AC | PRN
  Administered 2017-10-05 – 2017-10-07 (×4): 1 mg via INTRAVENOUS
  Filled 2017-10-05 (×3): qty 1

## 2017-10-05 MED ORDER — FENTANYL CITRATE (PF) 100 MCG/2ML IJ SOLN
50.0000 ug | INTRAMUSCULAR | Status: DC | PRN
  Administered 2017-10-06 (×3): 50 ug via INTRAVENOUS
  Filled 2017-10-05 (×4): qty 2

## 2017-10-05 MED ORDER — MAGNESIUM SULFATE 2 GM/50ML IV SOLN
2.0000 g | Freq: Once | INTRAVENOUS | Status: AC
  Administered 2017-10-05: 2 g via INTRAVENOUS
  Filled 2017-10-05: qty 50

## 2017-10-05 MED ORDER — IPRATROPIUM-ALBUTEROL 0.5-2.5 (3) MG/3ML IN SOLN: 3.0000 mL | RESPIRATORY_TRACT | Status: DC | PRN

## 2017-10-05 MED ORDER — SODIUM CHLORIDE 0.9 % IV SOLN
INTRAVENOUS | Status: DC
  Administered 2017-10-05 – 2017-10-06 (×3): via INTRAVENOUS
  Filled 2017-10-05 (×7): qty 1000

## 2017-10-05 MED ORDER — ADULT MULTIVITAMIN W/MINERALS CH
1.0000 | ORAL_TABLET | Freq: Every day | ORAL | Status: DC
  Administered 2017-10-05 – 2017-10-08 (×4): 1 via ORAL
  Filled 2017-10-05 (×4): qty 1

## 2017-10-05 MED ORDER — ONDANSETRON HCL 4 MG PO TABS: 4.0000 mg | ORAL_TABLET | Freq: Four times a day (QID) | ORAL | Status: DC | PRN

## 2017-10-05 NOTE — Progress Notes (Signed)
Patient arrived to unit via stretcher. Alert, able to make needs known. Oriented to unit and call light use.

## 2017-10-05 NOTE — Evaluation (Addendum)
Physical Therapy Evaluation Patient Details Name: April [name] MRN: [id number] DOB: [birth year] Today's Date: 10/05/2017   History of Present Illness  71 y.o. female with medical history significant of HTN, HLD, pancreatitis, alcohol abuse, and tobacco abuse; who presents with complaints of right-sided abdominal pain for last 2 days.   Clinical Impression  Pt admitted with above diagnosis. Pt currently with functional limitations due to the deficits listed below (see PT Problem List). Pt ambulated 380' with RW, HR 109, SaO2 95% on room air. She has poor sensation in her feet which is likely contributing to her falls (2 in past 2 months). She has been walking without her RW at home, recommended she use RW for increased stability. Outpatient PT for balance recommended.  Pt will benefit from skilled PT to increase their independence and safety with mobility to allow discharge to the venue listed below.       Follow Up Recommendations Outpatient PT (for balance)    Equipment Recommendations  None recommended by PT    Recommendations for Other Services       Precautions / Restrictions Precautions Precautions: Fall Precaution Comments:  2 falls in past 2 months; total of 3 in past year Restrictions Weight Bearing Restrictions: No      Mobility  Bed Mobility Overal bed mobility: Modified Independent             General bed mobility comments: with bedrail  Transfers Overall transfer level: Modified independent Equipment used: Rolling walker (2 wheeled) Transfers: Sit to/from Stand Sit to Stand: Supervision         General transfer comment: VCs hand placement; dizzy intially upon standing but this quickly resolved, pt reports this is baseline  Ambulation/Gait Ambulation/Gait assistance: Supervision Ambulation Distance (Feet): 380 Feet Assistive device: Rolling walker (2 wheeled) Gait Pattern/deviations: Trunk flexed;Step-through pattern   Gait velocity  interpretation: at or above normal speed for age/gender General Gait Details: steady without LOB, HR 109 walking, SaO2 95% RA, VCs for positioning in RW/posture  Stairs            Wheelchair Mobility    Modified Rankin (Stroke Patients Only)       Balance Overall balance assessment: History of Falls                                           Pertinent Vitals/Pain Pain Assessment: 0-10 Pain Score: 5  Pain Location: R abdominal pain Pain Descriptors / Indicators: Aching Pain Intervention(s): Limited activity within patient's tolerance;Monitored during session;Premedicated before session    Home Living Family/patient expects to be discharged to:: Private residence Living Arrangements: Children Available Help at Discharge: Family;Available 24 hours/day Type of Home: House Home Access: Stairs to enter Entrance Stairs-Rails: Right;Left;Can reach both Entrance Stairs-Number of Steps: 2 Home Layout: One level Home Equipment: Walker - 2 wheels;Cane - single point;Bedside commode      Prior Function Level of Independence: Independent         Comments: walks without AD in home, uses cane going out in yard; sponge bathes     Hand Dominance   Dominant Hand: Right    Extremity/Trunk Assessment   Upper Extremity Assessment Upper Extremity Assessment: Overall WFL for tasks assessed    Lower Extremity Assessment Lower Extremity Assessment: RLE deficits/detail;LLE deficits/detail (significantly decreased sensation to light touch B feet) RLE Deficits / Details: strength WFL; significantly decreased sensation to  light touch B feet RLE Sensation: history of peripheral neuropathy LLE Deficits / Details: strength WFL LLE Sensation: history of peripheral neuropathy    Cervical / Trunk Assessment Cervical / Trunk Assessment: Kyphotic  Communication   Communication: No difficulties  Cognition Arousal/Alertness: Awake/alert Behavior During Therapy: WFL  for tasks assessed/performed Overall Cognitive Status: Within Functional Limits for tasks assessed                                        General Comments      Exercises     Assessment/Plan    PT Assessment Patient needs continued PT services  PT Problem List Decreased balance;Pain       PT Treatment Interventions Balance training;Functional mobility training    PT Goals (Current goals can be found in the Care Plan section)  Acute Rehab PT Goals Patient Stated Goal: pt stated she likes to watch tv, doesn't go out much PT Goal Formulation: With patient Time For Goal Achievement: 10/19/17 Potential to Achieve Goals: Good    Frequency Min 3X/week   Barriers to discharge        Co-evaluation               AM-PAC PT "6 Clicks" Daily Activity  Outcome Measure Difficulty turning over in bed (including adjusting bedclothes, sheets and blankets)?: None Difficulty moving from lying on back to sitting on the side of the bed? : None Difficulty sitting down on and standing up from a chair with arms (e.g., wheelchair, bedside commode, etc,.)?: A Little Help needed moving to and from a bed to chair (including a wheelchair)?: A Little Help needed walking in hospital room?: A Little Help needed climbing 3-5 steps with a railing? : A Little 6 Click Score: 20    End of Session Equipment Utilized During Treatment: Gait belt Activity Tolerance: Patient tolerated treatment well Patient left: in chair;with call bell/phone within reach;with chair alarm set Nurse Communication: Mobility status PT Visit Diagnosis: History of falling (Z91.81)    Time: 1424-1450 PT Time Calculation (min) (ACUTE ONLY): 26 min   Charges:   PT Evaluation $PT Eval Low Complexity: 1 Low PT Treatments $Gait Training: 8-22 mins   PT G Codes:          Tamala SerUhlenberg, Carlie Solorzano Kistler 10/05/2017, 3:06 PM [phone]

## 2017-10-05 NOTE — Discharge Instructions (Signed)

## 2017-10-05 NOTE — Progress Notes (Signed)
Initial Nutrition Assessment  DOCUMENTATION CODES:   Severe malnutrition in context of chronic illness  INTERVENTION:    Monitor for diet advancement/toleration  Provide MVI daily  Obtain weekly weights  When diet advanced: Ensure Enlive po BID, each supplement provides 350 kcal and 20 grams of protein  NUTRITION DIAGNOSIS:   Malnutrition (Severe) related to chronic illness (pancreatitis/alcohol abuse) as evidenced by energy intake < or equal to 50% for > or equal to 1 month, severe depletion of body fat, severe depletion of muscle mass.  GOAL:   Patient will meet greater than or equal to 90% of their needs  MONITOR:   PO intake, Supplement acceptance, Diet advancement, Weight trends, Labs  REASON FOR ASSESSMENT:   Malnutrition Screening Tool    ASSESSMENT:   Pt with PMH significant for HLD, HTN, pancreatitis, and alcohol/tobacco abuse. Presents this admission with acute pancreatitis with suspected pseudocyst likely secondary to alcohol abuse.   Spoke with pt at bedside. Reports having decreased appetite for > 3 months due to "just not feeling hungry". Typically pt consumes 2 meals per day that consist of cereal with milk and a frozen tv dinner. Pt does not use protein supplementation at home. Pt drinks one glass of Brandy each day.   Discussed the importance of protein intake for preservation of lean body mass. Included protein supplement information in discharge instructions.   Records indicate pt has lost 8.7% of body wt in 7 months. This percentage in this time frame is not significant. Given pt's physical exam, suspect recent weight of 115 lb is an estimated wt. Will need to obtain weekly weights to monitor trends.   Nutrition-Focused physical exam completed. Findings are severe fat depletion, severe muscle depletion, and no edema.   Medications reviewed and include: folic acid, MVI with minerals, thiamine, NS with KCl @ 125 ml/hr Labs reviewed: K 3.4 (L) ALP 171  (H) Lipase 129 (H) AST 53 (H)   Diet Order:  Diet NPO time specified Except for: Sips with Meds, Ice Chips  Skin:  Reviewed, no issues  Last BM:  10/04/17  Height:   Ht Readings from Last 1 Encounters:  10/04/17 5\' 7"  (1.702 m)    Weight:   Wt Readings from Last 1 Encounters:  10/04/17 115 lb (52.2 kg)    Ideal Body Weight:  61.4 kg  BMI:  Body mass index is 18.01 kg/m.  Estimated Nutritional Needs:   Kcal:  1600-1800 kcal (31-34 kcal/kg)  Protein:  85-95 grams (1.6-1.8 g/kg)  Fluid:  >1.6 L/day  EDUCATION NEEDS:   Education needs addressed  Vanessa Kickarly Leandra Vanderweele RD, LDN Clinical Nutrition Pager # [phone]- [phone]

## 2017-10-05 NOTE — Progress Notes (Addendum)
Patient seen and examined with family members and RN in room. No active vomiting, less pain, continue npo/ivf  for alcohol pancreatitis Portal vein thrombosis, started on lovenox, will need to switch to oral meds once able to take po.  on alcohol withdrawal protocol.

## 2017-10-05 NOTE — Progress Notes (Signed)
ANTICOAGULATION CONSULT NOTE - Initial Consult  Pharmacy Consult for Lovenox Indication: Portal vein thrombus  Allergies  Allergen Reactions  . Naltrexone Shortness Of Breath  . Penicillins Rash    Has patient had a PCN reaction causing immediate rash, facial/tongue/throat swelling, SOB or lightheadedness with hypotension: Yes immediate rash  Has patient had a PCN reaction causing severe rash involving mucus membranes or skin necrosis: Yes Has patient had a PCN reaction that required hospitalization": No Has patient had a PCN reaction occurring within the last 10 years: Yes If all of the above answers are "NO", then may proceed with Cephalosporin use.     Patient Measurements: Height: 5\' 7"  (170.2 cm) Weight: 115 lb (52.2 kg) IBW/kg (Calculated) : 61.6  Vital Signs: Temp: 98 F (36.7 C) (10/23 0125) Temp Source: Oral (10/23 0125) BP: 125/84 (10/23 0125) Pulse Rate: 83 (10/23 0125)  Labs:  Recent Labs  10/04/17 1812 10/04/17 1855  HGB 12.7  --   HCT 37.1  --   PLT 385  --   CREATININE  --  0.49    Estimated Creatinine Clearance: 53.9 mL/min (by C-G formula based on SCr of 0.49 mg/dL).   Medical History: Past Medical History:  Diagnosis Date  . Anxiety   . Current smoker   . Depression   . ETOH abuse   . GERD (gastroesophageal reflux disease)   . History of shingles summer 2015  . HTN (hypertension)   . Hyperlipidemia   . Hypertension   . Leg edema   . Neuropathy   . Neuropathy     Medications:  Scheduled:  . folic acid  1 mg Oral Daily  . iopamidol      . LORazepam  0-4 mg Intravenous Q6H   Followed by  . [START ON 10/07/2017] LORazepam  0-4 mg Intravenous Q12H  . multivitamin with minerals  1 tablet Oral Daily  . nicotine  21 mg Transdermal Daily  . thiamine  100 mg Oral Daily   Or  . thiamine  100 mg Intravenous Daily   Infusions:  . sodium chloride     PRN: acetaminophen **OR** acetaminophen, fentaNYL (SUBLIMAZE) injection,  ipratropium-albuterol, LORazepam **OR** LORazepam  Assessment: 71 year old female admitted with RUQ pain.  CT shows nonocclusive thrombus in the portal vein.  Pharmacy is consulted to dose Lovenox.   No anticoagulants prior to admission  Baseline coags pending  CBC within normal limits  SCr 0.49, CrCl 54 ml/min  Goal of Therapy:  Anti-Xa level 0.6-1 units/ml 4hrs after LMWH dose given Monitor platelets by anticoagulation protocol: Yes   Plan:   Lovenox 1mg /kg SQ q12h  CBC at least q72h  Monitor for signs/symptoms of bleeding or thrombosis  Loralee PacasErin Makel Mcmann, PharmD, BCPS Pager: [phone] 10/05/2017,2:04 AM

## 2017-10-05 NOTE — ED Notes (Signed)
Report called to gwen, transferring to [id number]. April [name] P April [name]

## 2017-10-05 NOTE — H&P (Signed)
History and Physical    April PoissonDeborah S Hogrefe WUJ:[id number] DOB: 08/24/1946 DOA: 10/04/2017  Referring MD/NP/PA: Berenice Primasyler Leaphardt, PA-C PCP: Richmond CampbellKaplan, Kristen W., PA-C  Patient coming from: home via EMS  Chief Complaint: Abdominal pain  HPI: April [name] is a 71 y.o. female with medical history significant of HTN, HLD, pancreatitis, alcohol abuse, and tobacco abuse; who presents with complaints of right-sided abdominal pain for last 2 days. Describes pain as sharp that comes and goes. Associated symptoms include nausea. Denies having any significant vomiting, diarrhea, blood in stool, or worsening shortness of breath. Patient reports drinking approximately 8 ounces of brandy daily, and last had 2 shots this yesterday morning.  Patient reports last being admitted for pancreatitis in February and then again in March at which time she had to have her gallbladder removed.  ED Course: Upon admission into the emergency department patient was seen to be afebrile, pulse 83-122, respirations 13-20, blood pressure 89/70-129/82, respirations 92-97%. Labs reveal WBC 9.6, hemoglobin 12.7, platelets 385, alkaline phosphatase 158, AST 63, ALT 16, and lipase 129. Urinalysis was negative for any acute abnormalities and stool was fecal occult negative. CT scan of the abdomen revealed interval development of 5 x 4 x 3 cm loculated appearing cystic lesion with mass effect with acute pancreatitis, and an full development of nonocclusive thrombus in the portal vein.  Review of Systems  Constitutional: Positive for malaise/fatigue. Negative for chills and fever.  HENT: Negative for ear discharge and nosebleeds.   Eyes: Negative for photophobia and pain.  Respiratory: Negative for hemoptysis and shortness of breath.   Cardiovascular: Negative for chest pain and orthopnea.  Gastrointestinal: Positive for abdominal pain and nausea. Negative for blood in stool and vomiting.  Genitourinary: Negative for dysuria, hematuria  and urgency.  Musculoskeletal: Positive for back pain. Negative for falls.  Skin: Negative for itching and rash.  Neurological: Positive for weakness. Negative for focal weakness, seizures and loss of consciousness.  Psychiatric/Behavioral: Positive for substance abuse. Negative for hallucinations.    Past Medical History:  Diagnosis Date  . Anxiety   . Current smoker   . Depression   . ETOH abuse   . GERD (gastroesophageal reflux disease)   . History of shingles summer 2015  . HTN (hypertension)   . Hyperlipidemia   . Hypertension   . Leg edema   . Neuropathy   . Neuropathy     Past Surgical History:  Procedure Laterality Date  . ERCP N/A 02/01/2017   Procedure: ENDOSCOPIC RETROGRADE CHOLANGIOPANCREATOGRAPHY (ERCP);  Surgeon: Dorena CookeyJohn Hayes, MD;  Location[facility];  Service: Endoscopy;  Laterality: N/A;  . LAPAROSCOPIC CHOLECYSTECTOMY WITH COMMON DUCT EXPLORATION AND INTRAOP CHOLANGIOGRAM N/A 02/03/2017   Procedure: LAPAROSCOPIC CHOLECYSTECTOMY WITH POSSIBLE COMMON DUCT EXPLORATION AND INTRAOP CHOLANGIOGRAM;  Surgeon: Ovidio Kinavid Newman, MD;  Location: [facility];  Service: General;  Laterality: N/A;  . removal of uterine fibroids  2004     reports that she has been smoking Cigarettes.  She has been smoking about 1.00 pack per day. She has never used smokeless tobacco. She reports that she drinks about 8.4 oz of alcohol per week . She reports that she does not use drugs.  Allergies  Allergen Reactions  . Naltrexone Shortness Of Breath  . Penicillins Rash    Has patient had a PCN reaction causing immediate rash, facial/tongue/throat swelling, SOB or lightheadedness with hypotension: Yes immediate rash  Has patient had a PCN reaction causing severe rash involving mucus membranes or skin necrosis: Yes Has patient had  a PCN reaction that required hospitalization": No Has patient had a PCN reaction occurring within the last 10 years: Yes If all of the above answers are "NO", then may  proceed with Cephalosporin use.     Family History  Problem Relation Age of Onset  . Heart disease Father   . Neuropathy Neg Hx     Prior to Admission medications   Medication Sig Start Date End Date Taking? Authorizing Provider  amitriptyline (ELAVIL) 25 MG tablet Take 25 mg by mouth at bedtime as needed for sleep.  08/27/16  Yes [provider]  clonazePAM (KLONOPIN) 0.5 MG tablet Take 0.5 mg by mouth daily. as directed 08/05/17  Yes [provider]  gabapentin (NEURONTIN) 300 MG capsule Take 2 capsules (600 mg total) by mouth 3 (three) times daily. 10/29/16  Yes Anson Fret, MD  KLOR-CON M20 20 MEQ tablet Take 20 mEq by mouth daily. 10/03/17  Yes [provider]  Multiple Vitamin (MULTIVITAMIN WITH MINERALS) TABS tablet Take 1 tablet by mouth daily. 04/13/17  Yes Penny Pia, MD  vitamin B-12 1000 MCG tablet Take 1 tablet (1,000 mcg total) by mouth daily. 02/07/17  Yes Tyrone Nine, MD  Vitamin D, Ergocalciferol, (DRISDOL) 50000 units CAPS capsule Take 50,000 Units by mouth once a week. 07/31/16  Yes [provider]    Physical Exam:  Constitutional: Elderly female in no acute distress  Vitals:   10/05/17 0031 10/05/17 0034 10/05/17 0100 10/05/17 0125  BP:  121/76 129/76 125/84  Pulse:    83  Resp: 16 17 14 13   Temp:    98 F (36.7 C)  TempSrc:    Oral  SpO2:    95%  Weight:      Height:       Eyes: PERRL, lids and conjunctivae normal ENMT: Mucous membranes are dry. Posterior pharynx clear of any exudate or lesions.  Neck: normal, supple, no masses, no thyromegaly Respiratory: clear to auscultation bilaterally, no wheezing, no crackles. Normal respiratory effort. No accessory muscle use.  Cardiovascular: Regular rate and rhythm, no murmurs / rubs / gallops. No extremity edema. 2+ pedal pulses. No carotid bruits.  Abdomen:Mild epigastric and right quadrant tenderness, no masses palpated. No hepatosplenomegaly. Bowel sounds positive.    Musculoskeletal: no clubbing / cyanosis. No joint deformity upper and lower extremities. Good ROM, no contractures. Normal muscle tone.  Skin: no rashes, lesions, ulcers. No induration Neurologic: CN 2-12 grossly intact. Sensation intact, DTR normal. Strength 5/5 in all 4.  Psychiatric: Normal judgment and insight. Alert and oriented x 3. Normal mood.     Labs on Admission: I have personally reviewed following labs and imaging studies  CBC:  Recent Labs Lab 10/04/17 1812  WBC 9.6  HGB 12.7  HCT 37.1  MCV 107.2*  PLT 385   Basic Metabolic Panel:  Recent Labs Lab 10/04/17 1855  NA 139  K 4.4  CL 105  CO2 24  GLUCOSE 95  BUN 11  CREATININE 0.49  CALCIUM 8.5*   GFR: Estimated Creatinine Clearance: 53.9 mL/min (by C-G formula based on SCr of 0.49 mg/dL). Liver Function Tests:  Recent Labs Lab 10/04/17 1855  AST 63*  ALT 16  ALKPHOS 158*  BILITOT 1.0  PROT 5.3*  ALBUMIN 2.5*    Recent Labs Lab 10/04/17 1855  LIPASE 129*   No results for input(s): AMMONIA in the last 168 hours. Coagulation Profile: No results for input(s): INR, PROTIME in the last 168 hours. Cardiac Enzymes: No  results for input(s): CKTOTAL, CKMB, CKMBINDEX, TROPONINI in the last 168 hours. BNP (last 3 results) No results for input(s): PROBNP in the last 8760 hours. HbA1C: No results for input(s): HGBA1C in the last 72 hours. CBG: No results for input(s): GLUCAP in the last 168 hours. Lipid Profile: No results for input(s): CHOL, HDL, LDLCALC, TRIG, CHOLHDL, LDLDIRECT in the last 72 hours. Thyroid Function Tests: No results for input(s): TSH, T4TOTAL, FREET4, T3FREE, THYROIDAB in the last 72 hours. Anemia Panel: No results for input(s): VITAMINB12, FOLATE, FERRITIN, TIBC, IRON, RETICCTPCT in the last 72 hours. Urine analysis:    Component Value Date/Time   COLORURINE YELLOW 10/04/2017 1709   APPEARANCEUR HAZY (A) 10/04/2017 1709   LABSPEC 1.016 10/04/2017 1709   PHURINE 5.0  10/04/2017 1709   GLUCOSEU NEGATIVE 10/04/2017 1709   HGBUR NEGATIVE 10/04/2017 1709   BILIRUBINUR NEGATIVE 10/04/2017 1709   KETONESUR 5 (A) 10/04/2017 1709   PROTEINUR NEGATIVE 10/04/2017 1709   NITRITE NEGATIVE 10/04/2017 1709   LEUKOCYTESUR NEGATIVE 10/04/2017 1709   Sepsis Labs: No results found for this or any previous visit (from the past 240 hour(s)).   Radiological Exams on Admission: Ct Abdomen Pelvis W Contrast  Result Date: 10/04/2017 CLINICAL DATA:  Abdominal pain EXAM: CT ABDOMEN AND PELVIS WITH CONTRAST TECHNIQUE: Multidetector CT imaging of the abdomen and pelvis was performed using the standard protocol following bolus administration of intravenous contrast. CONTRAST:  ISOVUE-300 IOPAMIDOL (ISOVUE-300) INJECTION 61%, 30mL ISOVUE-300 IOPAMIDOL (ISOVUE-300) INJECTION 61% COMPARISON:  04/10/2017 FINDINGS: Lower chest:  Unremarkable. Hepatobiliary: The liver shows diffusely decreased attenuation suggesting steatosis. Liver is enlarged measuring 21 cm craniocaudal length. Gallbladder surgically absent. No substantial intra or extrahepatic biliary duct dilatation. Pancreas: 4.7 x 3.6 x 3.3 cm multi a lobular complex cystic lesion identified in the anterior head of pancreas extending anteriorly to displace the gastric antrum and laterally to generate mass-effect on the proximal duodenum. Mild distention of the main pancreatic duct noted with other small cystic areas in the pancreatic head and body measuring in the 5-6 mm size range. There is peripancreatic edema and inflammation. Spleen: No splenomegaly. No focal mass lesion. Adrenals/Urinary Tract: No adrenal nodule or mass. Kidneys are unremarkable. No evidence for hydroureter. The urinary bladder appears normal for the degree of distention. Stomach/Bowel: Stomach is not distended despite mass-effect on the antrum by the above-described lesion. Mass-effect also noted on the duodenum. No small bowel wall thickening. No small bowel  dilatation. The terminal ileum is normal. The appendix is normal. Colon is diffusely decompressed with diverticular changes noted in the left colon Vascular/Lymphatic: There is abdominal aortic atherosclerosis without aneurysm. Nonocclusive thrombus is identified in the portal vein in the region of the porto splenic confluence (see images 28 and 29 of series 2 and image 65 of series 3). The splenic vein is markedly attenuated but appears patent. The celiac axis, splenic artery, and common hepatic artery are patent. The SMA is patent. Peripancreatic lymphadenopathy is identified in the region of the pancreatic head. There is hepatoduodenal ligament lymphadenopathy. No pelvic sidewall lymphadenopathy. Reproductive: Fibroid change noted in the uterus. There is no adnexal mass. Other: Small volume intraperitoneal free fluid noted. Musculoskeletal: Bone windows reveal no worrisome lytic or sclerotic osseous lesions. IMPRESSION: 1. Interval development of a 5 x 4 x 3 cm multi loculated apparently cystic lesion in the anterior pancreatic head, generating mass-effect and displacing the gastric antrum and proximal duodenum. Imaging features likely related to pseudocyst formation from pancreatitis. Other tiny cystic foci are  seen in the head and body of pancreas without substantial dilatation of the main pancreatic duct. There is associated peripancreatic edema/inflammation. Imaging features are consistent with pancreatitis. Excluded, close follow-up recommended. As pancreatic head neoplasm is not 2. Interval development of nonocclusive thrombus in the portal vein, near the porta splenic confluence. The splenic vein is markedly attenuated but does appear to be patent on the current exam. 3. Peripancreatic and hepatoduodenal ligament lymphadenopathy. 4. Hepatomegaly with hepatic steatosis. 5.  Aortic Atherosclerois (ICD10-170.0) Electronically Signed   By: Kennith Center M.D.   On: 10/04/2017 21:04   CT scan of abdomen:  Independently reviewed. Showing fluid collection and inflammation around pancreas  Assessment/Plan Pancreatitis with suspected pseudocyst: Acute.Patient noted to have acute right-sided abdominal pain. Imaging studies reveal pancreatitis with possible pseudocyst likely secondary to alcohol abuse. - Admit to telemetry bed - NPO - IV fluids NS at 142ml/hr - Fentanyl Iv prn Pain - Incentive spirometry  - Will need consult to GI in a.m.  Macrocytic macrochromic anemia: Patient hemoglobin of 12.7 on admission with elevated MCV and MCH. Given patient's history of alcohol abuse suspect likely deficient in vitamin B12 and/or folate. - Check vit b12 and folate - Recheck CBC in a.m.   Portal vein thrombus: Incidental finding on CT scan of the abdomen. - Lovenox per pharmacy  Alcohol abuse with complications of neuropathy: Patient estimates drinking these 8 ounces of brandy daily. - CWIA protocols with scheduled Ativan - Social work consult for alcohol abuse  Tobacco abuse - Counseled on the need for cessation  - Nicotine patch    DVT prophylaxis: Lovenox Code Status:Full  Family Communication:  No family present at bedside Disposition Plan: Likely discharge home once medically stable  Consults called: nonr  Admission status: Inpatient  Clydie Braun MD [facility] Pager [phone]   If 7PM-7AM, please contact night-coverage [email or web address] Password [email or web address]  10/05/2017, 1:33 AM

## 2017-10-05 NOTE — Clinical Social Work Note (Signed)
Clinical Social Work Assessment  Patient Details  Name: April PoissonDeborah S [name] MRN: [id number] Date of Birth: 06/16/1946  Date of referral:  10/05/17               Reason for consult:  Substance Use/ETOH Abuse                Permission sought to share information with:  Case Manager, Family Supports Permission granted to share information::     Name::        Agency::     Relationship::     Contact Information:     Housing/Transportation Living arrangements for the past 2 months:  Single Family Home Source of Information:  Patient Patient Interpreter Needed:  None Criminal Activity/Legal Involvement Pertinent to Current Situation/Hospitalization:  No - Comment as needed Significant Relationships:  Adult Children Lives with:  Adult Children Do you feel safe going back to the place where you live?  Yes Need for family participation in patient care:  Yes (Comment)  Care giving concerns:  No care giving concerns at the time of assessment. Patient reports that she lives with her daughter and son in law who are both very supportive.    Social Worker assessment / plan:  LCSW consulted for SA.  Patient was admitted to the hospital for Pancreatitis.  Patient reports that she had a fall and hurt her hip. She stated that her fall was not due to alcohol use, but that she has some balance issues.   Patient was open to outpatient resources.  PLAN: LCSW provided outpatient resources to patient and explained how to understand the options.   Employment status:  Retired Health and safety inspectornsurance information:  Managed Care PT Recommendations:  No Follow Up Information / Referral to community resources:  Outpatient Substance Abuse Treatment Options  Patient/Family's Response to care: Patient was open to receiving outpatient resources. Patient appeared to be receptive to assistance.   Patient/Family's Understanding of and Emotional Response to Diagnosis, Current Treatment, and Prognosis:    Patient was optimistic  regarding care and understanding of treatment plan.   Emotional Assessment Appearance:    Attitude/Demeanor/Rapport:    Affect (typically observed):  Accepting, Calm, Happy, Pleasant Orientation:  Oriented to Self, Oriented to Place, Oriented to  Time, Oriented to Situation Alcohol / Substance use:  Alcohol Use, Tobacco Use Psych involvement (Current and /or in the community):  No (Comment)  Discharge Needs  Concerns to be addressed:  No discharge needs identified Readmission within the last 30 days:  No Current discharge risk:  None Barriers to Discharge:  Continued Medical Work up   BJ'sBernette Oziah Vitanza, LCSW 10/05/2017, 3:54 PM

## 2017-10-05 NOTE — ED Provider Notes (Signed)
[facility]-EMERGENCY DEPT Provider Note   CSN: [id number] Arrival date & time: 10/04/17  1703     History   Chief Complaint Chief Complaint  Patient presents with  . Abdominal Pain    HPI April [name] is a 71 y.o. female.  HPI 71 year old female past medical history significant for EtOH abuse, hypertension tobacco abuse presents to the ED with complaints of right lower quadrant abdominal pain.  The patient states that the pain started yesterday.  Pain has progressively worsened.  The pain is constant and describes it as sharp and burning in nature at times.  States that this does feel similar in the past with her shingles however she has noticed no rash.  Patient reports nausea and one episode of nonbloody nonbilious emesis this evening.  Nothing makes better or worse.  Pain is not associated with food.  She is tried nothing for the pain at home.  Patient does report having 2 episodes of black stool yesterday but denies any diarrhea or hematochezia. Denies any iron or pepto bismol use. Pt does report daily alcohol use.  History of cholecystectomy but no other abdominal surgeries.  Pt denies any fever, chill, ha, vision changes, lightheadedness, dizziness, congestion, neck pain, cp, sob, cough, urinary symptoms, change in bowel habits,  hematochezia, lower extremity paresthesias.  Past Medical History:  Diagnosis Date  . Anxiety   . Current smoker   . Depression   . ETOH abuse   . GERD (gastroesophageal reflux disease)   . History of shingles summer 2015  . HTN (hypertension)   . Hyperlipidemia   . Hypertension   . Leg edema   . Neuropathy   . Neuropathy     Patient Active Problem List   Diagnosis Date Noted  . Pancreatitis 10/05/2017  . Frequent falls 02/11/2017  . Protein-calorie malnutrition, severe 02/05/2017  . Gallstone pancreatitis 01/30/2017  . Hypokalemia 01/30/2017  . Macrocytic anemia 01/30/2017  . Metabolic acidosis 01/30/2017  .  Neuropathy, alcoholic (HCC) 09/08/2016  . Neuropathy 12/11/2015  . Alcohol abuse 12/11/2015  . Tobacco abuse 12/11/2015  . Chronic venous insufficiency 09/13/2015  . Extremity numbness 09/13/2015    Past Surgical History:  Procedure Laterality Date  . ERCP N/A 02/01/2017   Procedure: ENDOSCOPIC RETROGRADE CHOLANGIOPANCREATOGRAPHY (ERCP);  Surgeon: Dorena Cookey, MD;  Location: [facility];  Service: Endoscopy;  Laterality: N/A;  . LAPAROSCOPIC CHOLECYSTECTOMY WITH COMMON DUCT EXPLORATION AND INTRAOP CHOLANGIOGRAM N/A 02/03/2017   Procedure: LAPAROSCOPIC CHOLECYSTECTOMY WITH POSSIBLE COMMON DUCT EXPLORATION AND INTRAOP CHOLANGIOGRAM;  Surgeon: Ovidio Kin, MD;  Location: [facility];  Service: General;  Laterality: N/A;  . removal of uterine fibroids  2004    OB History    No data available       Home Medications    Prior to Admission medications   Medication Sig Start Date End Date Taking? Authorizing Provider  amitriptyline (ELAVIL) 25 MG tablet Take 25 mg by mouth at bedtime as needed for sleep.  08/27/16  Yes [provider]  clonazePAM (KLONOPIN) 0.5 MG tablet Take 0.5 mg by mouth daily. as directed 08/05/17  Yes [provider]  gabapentin (NEURONTIN) 300 MG capsule Take 2 capsules (600 mg total) by mouth 3 (three) times daily. 10/29/16  Yes Anson Fret, MD  KLOR-CON M20 20 MEQ tablet Take 20 mEq by mouth daily. 10/03/17  Yes [provider]  Multiple Vitamin (MULTIVITAMIN WITH MINERALS) TABS tablet Take 1 tablet by mouth daily. 04/13/17  Yes Penny Pia, MD  vitamin B-12 1000 MCG tablet Take 1 tablet (1,000 mcg total) by mouth daily. 02/07/17  Yes Tyrone NineGrunz, Ryan B, MD  Vitamin D, Ergocalciferol, (DRISDOL) 50000 units CAPS capsule Take 50,000 Units by mouth once a week. 07/31/16  Yes [provider]    Family History Family History  Problem Relation Age of Onset  . Heart disease Father   . Neuropathy Neg Hx     Social History Social History    Substance Use Topics  . Smoking status: Current Every Day Smoker    Packs/day: 1.00    Types: Cigarettes  . Smokeless tobacco: Never Used  . Alcohol use 8.4 oz/week    14 Shots of liquor per week     Comment: states., "I keep a glass with me", reports drinking today (04/09/17)     Allergies   Naltrexone and Penicillins   Review of Systems Review of Systems  Constitutional: Negative for chills and fever.  HENT: Negative for congestion.   Eyes: Negative for visual disturbance.  Respiratory: Negative for cough and shortness of breath.   Cardiovascular: Negative for chest pain.  Gastrointestinal: Positive for abdominal pain, nausea and vomiting. Negative for diarrhea.  Genitourinary: Negative for dysuria, flank pain, frequency, hematuria and urgency.  Musculoskeletal: Negative for arthralgias and myalgias.  Skin: Negative for rash.  Neurological: Negative for dizziness, syncope, weakness, light-headedness, numbness and headaches.  Psychiatric/Behavioral: Negative for sleep disturbance. The patient is not nervous/anxious.      Physical Exam Updated Vital Signs BP 125/84 (BP Location: Left Arm)   Pulse 83   Temp 98 F (36.7 C) (Oral)   Resp 13   Ht 5\' 7"  (1.702 m)   Wt 52.2 kg (115 lb)   SpO2 95%   BMI 18.01 kg/m   Physical Exam  Constitutional: She is oriented to person, place, and time. She appears well-developed and well-nourished.  Non-toxic appearance. No distress.  HENT:  Head: Normocephalic and atraumatic.  Nose: Nose normal.  Mouth/Throat: Oropharynx is clear and moist.  Eyes: Pupils are equal, round, and reactive to light. Conjunctivae are normal. Right eye exhibits no discharge. Left eye exhibits no discharge.  Neck: Normal range of motion. Neck supple.  Cardiovascular: Normal rate, regular rhythm, normal heart sounds and intact distal pulses.   Pulmonary/Chest: Effort normal and breath sounds normal. No respiratory distress. She exhibits no tenderness.   Abdominal: Soft. Bowel sounds are normal. There is tenderness in the right lower quadrant and epigastric area. There is guarding. There is no rigidity, no rebound, no CVA tenderness, no tenderness at McBurney's point and negative Murphy's sign.  Musculoskeletal: Normal range of motion. She exhibits no tenderness.  Lymphadenopathy:    She has no cervical adenopathy.  Neurological: She is alert and oriented to person, place, and time.  Skin: Skin is warm and dry. Capillary refill takes less than 2 seconds. No rash noted.  Psychiatric: Her behavior is normal. Judgment and thought content normal.  Nursing note and vitals reviewed.    ED Treatments / Results  Labs (all labs ordered are listed, but only abnormal results are displayed) Labs Reviewed  CBC - Abnormal; Notable for the following:       Result Value   RBC 3.46 (*)    MCV 107.2 (*)    MCH 36.7 (*)    RDW 16.2 (*)    All other components within normal limits  URINALYSIS, ROUTINE W REFLEX MICROSCOPIC - Abnormal; Notable for the following:    APPearance HAZY (*)  Ketones, ur 5 (*)    All other components within normal limits  COMPREHENSIVE METABOLIC PANEL - Abnormal; Notable for the following:    Calcium 8.5 (*)    Total Protein 5.3 (*)    Albumin 2.5 (*)    AST 63 (*)    Alkaline Phosphatase 158 (*)    All other components within normal limits  LIPASE, BLOOD - Abnormal; Notable for the following:    Lipase 129 (*)    All other components within normal limits  CBC  COMPREHENSIVE METABOLIC PANEL  VITAMIN B12  FOLATE RBC  POC OCCULT BLOOD, ED    EKG  EKG Interpretation None       Radiology Ct Abdomen Pelvis W Contrast  Result Date: 10/04/2017 CLINICAL DATA:  Abdominal pain EXAM: CT ABDOMEN AND PELVIS WITH CONTRAST TECHNIQUE: Multidetector CT imaging of the abdomen and pelvis was performed using the standard protocol following bolus administration of intravenous contrast. CONTRAST:  ISOVUE-300 IOPAMIDOL  (ISOVUE-300) INJECTION 61%, 30mL ISOVUE-300 IOPAMIDOL (ISOVUE-300) INJECTION 61% COMPARISON:  04/10/2017 FINDINGS: Lower chest:  Unremarkable. Hepatobiliary: The liver shows diffusely decreased attenuation suggesting steatosis. Liver is enlarged measuring 21 cm craniocaudal length. Gallbladder surgically absent. No substantial intra or extrahepatic biliary duct dilatation. Pancreas: 4.7 x 3.6 x 3.3 cm multi a lobular complex cystic lesion identified in the anterior head of pancreas extending anteriorly to displace the gastric antrum and laterally to generate mass-effect on the proximal duodenum. Mild distention of the main pancreatic duct noted with other small cystic areas in the pancreatic head and body measuring in the 5-6 mm size range. There is peripancreatic edema and inflammation. Spleen: No splenomegaly. No focal mass lesion. Adrenals/Urinary Tract: No adrenal nodule or mass. Kidneys are unremarkable. No evidence for hydroureter. The urinary bladder appears normal for the degree of distention. Stomach/Bowel: Stomach is not distended despite mass-effect on the antrum by the above-described lesion. Mass-effect also noted on the duodenum. No small bowel wall thickening. No small bowel dilatation. The terminal ileum is normal. The appendix is normal. Colon is diffusely decompressed with diverticular changes noted in the left colon Vascular/Lymphatic: There is abdominal aortic atherosclerosis without aneurysm. Nonocclusive thrombus is identified in the portal vein in the region of the porto splenic confluence (see images 28 and 29 of series 2 and image 65 of series 3). The splenic vein is markedly attenuated but appears patent. The celiac axis, splenic artery, and common hepatic artery are patent. The SMA is patent. Peripancreatic lymphadenopathy is identified in the region of the pancreatic head. There is hepatoduodenal ligament lymphadenopathy. No pelvic sidewall lymphadenopathy. Reproductive: Fibroid change  noted in the uterus. There is no adnexal mass. Other: Small volume intraperitoneal free fluid noted. Musculoskeletal: Bone windows reveal no worrisome lytic or sclerotic osseous lesions. IMPRESSION: 1. Interval development of a 5 x 4 x 3 cm multi loculated apparently cystic lesion in the anterior pancreatic head, generating mass-effect and displacing the gastric antrum and proximal duodenum. Imaging features likely related to pseudocyst formation from pancreatitis. Other tiny cystic foci are seen in the head and body of pancreas without substantial dilatation of the main pancreatic duct. There is associated peripancreatic edema/inflammation. Imaging features are consistent with pancreatitis. Excluded, close follow-up recommended. As pancreatic head neoplasm is not 2. Interval development of nonocclusive thrombus in the portal vein, near the porta splenic confluence. The splenic vein is markedly attenuated but does appear to be patent on the current exam. 3. Peripancreatic and hepatoduodenal ligament lymphadenopathy. 4. Hepatomegaly with hepatic steatosis.  5.  Aortic Atherosclerois (ICD10-170.0) Electronically Signed   By: Kennith Center M.D.   On: 10/04/2017 21:04    Procedures Procedures (including critical care time)  Medications Ordered in ED Medications  iopamidol (ISOVUE-300) 61 % injection (not administered)  fentaNYL (SUBLIMAZE) injection 50 mcg (not administered)  0.9 %  sodium chloride infusion (not administered)  acetaminophen (TYLENOL) tablet 650 mg (not administered)    Or  acetaminophen (TYLENOL) suppository 650 mg (not administered)  ipratropium-albuterol (DUONEB) 0.5-2.5 (3) MG/3ML nebulizer solution 3 mL (not administered)  nicotine (NICODERM CQ - dosed in mg/24 hours) patch 21 mg (not administered)  LORazepam (ATIVAN) tablet 1 mg (not administered)    Or  LORazepam (ATIVAN) injection 1 mg (not administered)  thiamine (VITAMIN B-1) tablet 100 mg (not administered)    Or  thiamine  (B-1) injection 100 mg (not administered)  folic acid (FOLVITE) tablet 1 mg (not administered)  multivitamin with minerals tablet 1 tablet (not administered)  LORazepam (ATIVAN) injection 0-4 mg (not administered)    Followed by  LORazepam (ATIVAN) injection 0-4 mg (not administered)  sodium chloride 0.9 % bolus 500 mL (0 mLs Intravenous Stopped 10/04/17 1910)  fentaNYL (SUBLIMAZE) injection 50 mcg (50 mcg Intravenous Given 10/04/17 1825)  fentaNYL (SUBLIMAZE) injection 50 mcg (50 mcg Intravenous Given 10/04/17 1946)  ondansetron (ZOFRAN) injection 4 mg (4 mg Intravenous Given 10/04/17 2011)  iopamidol (ISOVUE-300) 61 % injection 100 mL (100 mLs Intravenous Contrast Given 10/04/17 2037)  iopamidol (ISOVUE-300) 61 % injection 30 mL (30 mLs Oral Contrast Given 10/04/17 2038)  morphine 4 MG/ML injection 4 mg (4 mg Intravenous Given 10/04/17 2225)  ondansetron (ZOFRAN) injection 4 mg (4 mg Intravenous Given 10/04/17 2306)     Initial Impression / Assessment and Plan / ED Course  I have reviewed the triage vital signs and the nursing notes.  Pertinent labs & imaging results that were available during my care of the patient were reviewed by me and considered in my medical decision making (see chart for details).     Patient presents to the ED with complaints of right lower quadrant and epigastric abdominal pain.  History of chronic alcohol abuse.  Patient is overall well-appearing and nontoxic.  Vital signs are reassuring.  Patient is afebrile, no tachycardia.  She does have some mild epigastric and right lower quadrant abdominal tenderness to palpation with some guarding but no signs of peritonitis.  Lungs are clear to auscultation bilaterally.  Her lipase is slightly elevated at 129.  No leukocytosis.  UA shows no signs of infection.  CT scan was ordered to rule out any intra-abdominal pathology likely pancreatitis.  CT abd pelvis:   IMPRESSION: 1. Interval development of a 5 x 4 x 3 cm multi  loculated apparently cystic lesion in the anterior pancreatic head, generating mass-effect and displacing the gastric antrum and proximal duodenum. Imaging features likely related to pseudocyst formation from pancreatitis. Other tiny cystic foci are seen in the head and body of pancreas without substantial dilatation of the main pancreatic duct. There is associated peripancreatic edema/inflammation. Imaging features are consistent with pancreatitis. Excluded, close follow-up recommended. As pancreatic head neoplasm is not 2. Interval development of nonocclusive thrombus in the portal vein, near the porta splenic confluence. The splenic vein is markedly attenuated but does appear to be patent on the current exam. 3. Peripancreatic and hepatoduodenal ligament lymphadenopathy. 4. Hepatomegaly with hepatic steatosis. 5.  Aortic Atherosclerois  Given findings of acute pancreatitis patient has been given multiple rounds of pain  medicine and nausea medicine with only minimal relief in her pain and nausea and patient not able to tolerate p.o. fluids for the patient would benefit from admission to the hospital.  Spoke with Dr. Katrinka Blazing with hospital medicine who agrees to admission will place admission orders.  Patient seen by my attending Dr. Clayborne Dana who is agreeable to the above plan.  Patient remains hemodynamically stable at this time.  She was updated on plan of care.  All questions were answered.  Final Clinical Impressions(s) / ED Diagnoses   Final diagnoses:  Acute pancreatitis, unspecified complication status, unspecified pancreatitis type    New Prescriptions New Prescriptions   No medications on file     Wallace Keller 10/05/17 0206    Mesner, Barbara Cower, MD 10/11/17 1036

## 2017-10-06 DIAGNOSIS — K859 Acute pancreatitis without necrosis or infection, unspecified: Principal | ICD-10-CM

## 2017-10-06 DIAGNOSIS — E43 Unspecified severe protein-calorie malnutrition: Secondary | ICD-10-CM

## 2017-10-06 LAB — COMPREHENSIVE METABOLIC PANEL
ALBUMIN: 2.3 g/dL — AB (ref 3.5–5.0)
ALT: 21 U/L (ref 14–54)
AST: 58 U/L — ABNORMAL HIGH (ref 15–41)
Alkaline Phosphatase: 157 U/L — ABNORMAL HIGH (ref 38–126)
Anion gap: 10 (ref 5–15)
BILIRUBIN TOTAL: 1.2 mg/dL (ref 0.3–1.2)
BUN: 7 mg/dL (ref 6–20)
CHLORIDE: 100 mmol/L — AB (ref 101–111)
CO2: 24 mmol/L (ref 22–32)
Calcium: 7.8 mg/dL — ABNORMAL LOW (ref 8.9–10.3)
Creatinine, Ser: 0.31 mg/dL — ABNORMAL LOW (ref 0.44–1.00)
GFR calc Af Amer: >60 mL/min (ref >60)
GFR calc non Af Amer: >60 mL/min (ref >60)
GLUCOSE: 74 mg/dL (ref 65–99)
POTASSIUM: 3.2 mmol/L — AB (ref 3.5–5.1)
Sodium: 134 mmol/L — ABNORMAL LOW (ref 135–145)
TOTAL PROTEIN: 5 g/dL — AB (ref 6.5–8.1)

## 2017-10-06 LAB — MAGNESIUM: Magnesium: 1.5 mg/dL — ABNORMAL LOW (ref 1.7–2.4)

## 2017-10-06 LAB — CBC WITH DIFFERENTIAL/PLATELET
BASOS PCT: 0 %
Basophils Absolute: 0 10*3/uL (ref 0.0–0.1)
EOS PCT: 1 %
Eosinophils Absolute: 0.1 10*3/uL (ref 0.0–0.7)
HEMATOCRIT: 30.7 % — AB (ref 36.0–46.0)
Hemoglobin: 10.7 g/dL — ABNORMAL LOW (ref 12.0–15.0)
LYMPHS PCT: 19 %
Lymphs Abs: 1.1 10*3/uL (ref 0.7–4.0)
MCH: 37.5 pg — ABNORMAL HIGH (ref 26.0–34.0)
MCHC: 34.9 g/dL (ref 30.0–36.0)
MCV: 107.7 fL — ABNORMAL HIGH (ref 78.0–100.0)
MONO ABS: 0.7 10*3/uL (ref 0.1–1.0)
MONOS PCT: 12 %
NEUTROS ABS: 4 10*3/uL (ref 1.7–7.7)
Neutrophils Relative %: 68 %
PLATELETS: 286 10*3/uL (ref 150–400)
RBC: 2.85 MIL/uL — ABNORMAL LOW (ref 3.87–5.11)
RDW: 15.2 % (ref 11.5–15.5)
WBC: 5.9 10*3/uL (ref 4.0–10.5)

## 2017-10-06 LAB — LIPASE, BLOOD: LIPASE: 96 U/L — AB (ref 11–51)

## 2017-10-06 MED ORDER — POTASSIUM CHLORIDE IN NACL 40-0.9 MEQ/L-% IV SOLN
INTRAVENOUS | Status: AC
  Administered 2017-10-06 (×2): 100 mL/h via INTRAVENOUS
  Filled 2017-10-06 (×3): qty 1000

## 2017-10-06 MED ORDER — MAGNESIUM SULFATE 2 GM/50ML IV SOLN
2.0000 g | Freq: Once | INTRAVENOUS | Status: AC
  Administered 2017-10-06: 2 g via INTRAVENOUS
  Filled 2017-10-06: qty 50

## 2017-10-06 MED ORDER — SODIUM CHLORIDE 0.9 % IV SOLN: INTRAVENOUS | Status: DC

## 2017-10-06 NOTE — Care Management Note (Signed)
Case Management Note  Patient Details  Name: Venancio PoissonDeborah S Loser MRN: [id number] Date of Birth: 08/10/1946  Subjective/Objective:                  pancretitis  Action/Plan: Date:  October 06 2017 Chart reviewed for concurrent status and case management needs.  Will continue to follow patient progress.  Discharge Planning: following for needs  Expected discharge date: October 09, 2017  Marcelle SmilingRhonda Laine Fonner, BSN, [place]3, [place]CCM   [phone]   Expected Discharge Date:                  Expected Discharge Plan:  Home/Self Care  In-House Referral:     Discharge planning Services  CM Consult  Post Acute Care Choice:    Choice offered to:     DME Arranged:    DME Agency:     HH Arranged:    HH Agency:     Status of Service:  In process, will continue to follow  If discussed at Long Length of Stay Meetings, dates discussed:    Additional Comments:  Golda AcreDavis, Zimir Kittleson Lynn, RN 10/06/2017, 10:26 AM

## 2017-10-06 NOTE — Progress Notes (Signed)
Called to pt room by [employer] sitter noting patient is smoking E-Cigarette in the room.  I went into patient room with RN Danika and found patient to be in possession of E-Cigarette and steel cup 1/2 full of liquid that had odor of alcohol.  [facility] and she needed it just to relax and calm into evening before sleep.  Charge noticed, MD, [facility], and security notified of this occurrence.

## 2017-10-06 NOTE — Progress Notes (Signed)
[facility] PROGRESS NOTE    Progress Note  April [name]  ZOX:[id number] DOB: 11/01/1946 DOA: 10/04/2017 PCP: Richmond Campbell., PA-C     Brief Narrative:   April [name] is an 71 y.o. female Significant for essential hypertension, alcohol abuse and tobacco abuse comes in complaining of right-sided abdominal pain that started 2 days prior to admission accompanied by significant nausea and vomiting  Assessment/Plan:   Acute alcoholicPancreatitis with possible pseudocyst on imaging: Discontinue telemetry continue IV fluid hydration, IV pain medication. Will need a follow-up CT scan of the abdomen and pelvis in 6 weeks to reevaluate to pseudocyst. Keep patient nothing by mouth.  Alcohol abuse with peripheral neuropathy: Continue to monitor Ativan protocol. IV Thiamine on admission.  Macrocytic anemia: Likely due to alcohol  Severe protein caloric malnutrition: Ensure 3 times a day once able to take orals. DVT prophylaxis: Heparin Family Communication:none Disposition Plan/Barrier to D/C: none Code Status:     Code Status Orders        Start     Ordered   10/05/17 0153  Full code  Continuous     10/05/17 0158    Code Status History    Date Active Date Inactive Code Status Order ID Comments User Context   04/09/2017  8:20 PM 04/13/2017  7:04 PM Full Code [id number]  Michael Litter, MD ED   02/11/2017  8:51 AM 02/12/2017  5:05 PM Full Code [id number]  Haydee Salter, MD ED   01/30/2017  3:05 AM 02/06/2017  9:22 PM Full Code [id number]  Danford, Earl Lites, MD Inpatient        IV Access:    Peripheral IV   Procedures and diagnostic studies:   Ct Abdomen Pelvis W Contrast  Result Date: 10/04/2017 CLINICAL DATA:  Abdominal pain EXAM: CT ABDOMEN AND PELVIS WITH CONTRAST TECHNIQUE: Multidetector CT imaging of the abdomen and pelvis was performed using the standard protocol following bolus administration of intravenous contrast. CONTRAST:  ISOVUE-300  IOPAMIDOL (ISOVUE-300) INJECTION 61%, 30mL ISOVUE-300 IOPAMIDOL (ISOVUE-300) INJECTION 61% COMPARISON:  04/10/2017 FINDINGS: Lower chest:  Unremarkable. Hepatobiliary: The liver shows diffusely decreased attenuation suggesting steatosis. Liver is enlarged measuring 21 cm craniocaudal length. Gallbladder surgically absent. No substantial intra or extrahepatic biliary duct dilatation. Pancreas: 4.7 x 3.6 x 3.3 cm multi a lobular complex cystic lesion identified in the anterior head of pancreas extending anteriorly to displace the gastric antrum and laterally to generate mass-effect on the proximal duodenum. Mild distention of the main pancreatic duct noted with other small cystic areas in the pancreatic head and body measuring in the 5-6 mm size range. There is peripancreatic edema and inflammation. Spleen: No splenomegaly. No focal mass lesion. Adrenals/Urinary Tract: No adrenal nodule or mass. Kidneys are unremarkable. No evidence for hydroureter. The urinary bladder appears normal for the degree of distention. Stomach/Bowel: Stomach is not distended despite mass-effect on the antrum by the above-described lesion. Mass-effect also noted on the duodenum. No small bowel wall thickening. No small bowel dilatation. The terminal ileum is normal. The appendix is normal. Colon is diffusely decompressed with diverticular changes noted in the left colon Vascular/Lymphatic: There is abdominal aortic atherosclerosis without aneurysm. Nonocclusive thrombus is identified in the portal vein in the region of the porto splenic confluence (see images 28 and 29 of series 2 and image 65 of series 3). The splenic vein is markedly attenuated but appears patent. The celiac axis, splenic artery, and common hepatic artery are patent. The SMA is patent. Peripancreatic lymphadenopathy  is identified in the region of the pancreatic head. There is hepatoduodenal ligament lymphadenopathy. No pelvic sidewall lymphadenopathy. Reproductive: Fibroid  change noted in the uterus. There is no adnexal mass. Other: Small volume intraperitoneal free fluid noted. Musculoskeletal: Bone windows reveal no worrisome lytic or sclerotic osseous lesions. IMPRESSION: 1. Interval development of a 5 x 4 x 3 cm multi loculated apparently cystic lesion in the anterior pancreatic head, generating mass-effect and displacing the gastric antrum and proximal duodenum. Imaging features likely related to pseudocyst formation from pancreatitis. Other tiny cystic foci are seen in the head and body of pancreas without substantial dilatation of the main pancreatic duct. There is associated peripancreatic edema/inflammation. Imaging features are consistent with pancreatitis. Excluded, close follow-up recommended. As pancreatic head neoplasm is not 2. Interval development of nonocclusive thrombus in the portal vein, near the porta splenic confluence. The splenic vein is markedly attenuated but does appear to be patent on the current exam. 3. Peripancreatic and hepatoduodenal ligament lymphadenopathy. 4. Hepatomegaly with hepatic steatosis. 5.  Aortic Atherosclerois (ICD10-170.0) Electronically Signed   By: Kennith CenterEric  Mansell M.D.   On: 10/04/2017 21:04     Medical Consultants:    None.  Anti-Infectives:   None  Subjective:    April [name] she relates she continues to have epigastric pain.  Objective:    Vitals:   10/05/17 1352 10/05/17 1500 10/05/17 2056 10/06/17 0441  BP: (!) 147/89  128/86 (!) 146/82  Pulse: 95 (!) 109 (!) 102 96  Resp: 19  16 18   Temp: 98.5 F (36.9 C)  98.4 F (36.9 C) 98.6 F (37 C)  TempSrc: Oral  Oral Oral  SpO2: 100%  97% 96%  Weight:      Height:        Intake/Output Summary (Last 24 hours) at 10/06/17 0923 Last data filed at 10/06/17 0603  Gross per 24 hour  Intake          2229.17 ml  Output              801 ml  Net          1428.17 ml   Filed Weights   10/04/17 1713  Weight: 52.2 kg (115 lb)    Exam: General exam:  Cachectic in no acute distress Respiratory system: Good air movement and clear to auscultation. Cardiovascular system: Regular in rhythm with positive S1-S2 Gastrointestinal system: Positive bowel sounds abdomen is soft with epigastric tenderness with guarding. Central nervous system: Alert and oriented. No focal neurological deficits. Extremities: No pedal edema. Skin: No rashes, lesions or ulcers Psychiatry: Judgement and insight appear normal. Mood & affect appropriate.    Data Reviewed:    Labs: Basic Metabolic Panel:  Recent Labs Lab 10/04/17 1855 10/05/17 0517 10/06/17 0537  NA 139 137 134*  K 4.4 3.4* 3.2*  CL 105 101 100*  CO2 24 26 24   GLUCOSE 95 87 74  BUN 11 10 7   CREATININE 0.49 0.43* 0.31*  CALCIUM 8.5* 8.2* 7.8*  MG  --   --  1.5*   GFR Estimated Creatinine Clearance: 53.9 mL/min (A) (by C-G formula based on SCr of 0.31 mg/dL (L)). Liver Function Tests:  Recent Labs Lab 10/04/17 1855 10/05/17 0517 10/06/17 0537  AST 63* 53* 58*  ALT 16 21 21   ALKPHOS 158* 171* 157*  BILITOT 1.0 1.1 1.2  PROT 5.3* 5.6* 5.0*  ALBUMIN 2.5* 2.7* 2.3*    Recent Labs Lab 10/04/17 1855 10/06/17 0537  LIPASE 129* 96*  No results for input(s): AMMONIA in the last 168 hours. Coagulation profile  Recent Labs Lab 10/05/17 0219  INR 1.07    CBC:  Recent Labs Lab 10/04/17 1812 10/05/17 0517 10/06/17 0537  WBC 9.6 6.0 5.9  NEUTROABS  --   --  4.0  HGB 12.7 11.8* 10.7*  HCT 37.1 35.7* 30.7*  MCV 107.2* 108.8* 107.7*  PLT 385 318 286   Cardiac Enzymes: No results for input(s): CKTOTAL, CKMB, CKMBINDEX, TROPONINI in the last 168 hours. BNP (last 3 results) No results for input(s): PROBNP in the last 8760 hours. CBG: No results for input(s): GLUCAP in the last 168 hours. D-Dimer: No results for input(s): DDIMER in the last 72 hours. Hgb A1c: No results for input(s): HGBA1C in the last 72 hours. Lipid Profile: No results for input(s): CHOL, HDL,  LDLCALC, TRIG, CHOLHDL, LDLDIRECT in the last 72 hours. Thyroid function studies: No results for input(s): TSH, T4TOTAL, T3FREE, THYROIDAB in the last 72 hours.  Invalid input(s): FREET3 Anemia work up:  Recent Labs  10/05/17 0517  VITAMINB12 459   Sepsis Labs:  Recent Labs Lab 10/04/17 1812 10/05/17 0517 10/06/17 0537  WBC 9.6 6.0 5.9   Microbiology No results found for this or any previous visit (from the past 240 hour(s)).   Medications:   . enoxaparin (LOVENOX) injection  1 mg/kg Subcutaneous Q12H  . feeding supplement (ENSURE ENLIVE)  237 mL Oral BID BM  . folic acid  1 mg Oral Daily  . LORazepam  0-4 mg Intravenous Q6H   Followed by  . [START ON 10/07/2017] LORazepam  0-4 mg Intravenous Q12H  . multivitamin with minerals  1 tablet Oral Daily  . nicotine  21 mg Transdermal Daily  . thiamine  100 mg Oral Daily   Or  . thiamine  100 mg Intravenous Daily   Continuous Infusions: . 0.9 % sodium chloride with kcl 125 mL/hr at 10/06/17 0827      LOS: 1 day   Marinda Elk  [facility] Pager [phone]  *Please refer to [email or web address], password [email or web address] to get updated schedule on who will round on this patient, as hospitalists switch teams weekly. If 7PM-7AM, please contact night-coverage at [email or web address], password [email or web address] for any overnight needs.  10/06/2017, 9:23 AM

## 2017-10-07 ENCOUNTER — Encounter (HOSPITAL_COMMUNITY): Payer: Self-pay

## 2017-10-07 LAB — FOLATE RBC
Folate, Hemolysate: 373.8 ng/mL
Folate, RBC: 1035 ng/mL (ref >498)
Hematocrit: 36.1 % (ref 34.0–46.6)

## 2017-10-07 LAB — BASIC METABOLIC PANEL
ANION GAP: 20 — AB (ref 5–15)
BUN: 6 mg/dL (ref 6–20)
CO2: 18 mmol/L — AB (ref 22–32)
Calcium: 8.4 mg/dL — ABNORMAL LOW (ref 8.9–10.3)
Chloride: 97 mmol/L — ABNORMAL LOW (ref 101–111)
Creatinine, Ser: 0.38 mg/dL — ABNORMAL LOW (ref 0.44–1.00)
GLUCOSE: 99 mg/dL (ref 65–99)
POTASSIUM: 3.6 mmol/L (ref 3.5–5.1)
Sodium: 135 mmol/L (ref 135–145)

## 2017-10-07 MED ORDER — PROMETHAZINE HCL 25 MG/ML IJ SOLN
12.5000 mg | Freq: Four times a day (QID) | INTRAMUSCULAR | Status: DC | PRN
  Administered 2017-10-07 (×3): 12.5 mg via INTRAVENOUS
  Filled 2017-10-07 (×2): qty 1

## 2017-10-07 NOTE — Progress Notes (Signed)
[facility] PROGRESS NOTE    Progress Note  April [name]  ZOX:[id number] DOB: Jul 17, 1946 DOA: 10/04/2017 PCP: Richmond Campbell., PA-C     Brief Narrative:   TONJA [name] is an 71 y.o. female Significant for essential hypertension, alcohol abuse and tobacco abuse comes in complaining of right-sided abdominal pain that started 2 days prior to admission accompanied by significant nausea and vomiting  Assessment/Plan:   Acute alcoholicPancreatitis with possible pseudocyst on imaging: Discontinue telemetry continue IV fluid hydration, IV pain medication. Will need a follow-up CT scan of the abdomen and pelvis in 6 weeks to reevaluate to pseudocyst. Keep patient nothing by mouth. Nurse went in the room yesterday and found it to have half a cup of liquor and he cigarettes. She is today vomiting which is probably due to her of all consumption which has worsened her pancreatitis. The patient has been advised he cannot drink alcohol her family cannot bring her alcohol or cigarettes at bedside.  Alcohol abuse with peripheral neuropathy: Continue to monitor Ativan protocol. IV Thiamine on admission.  Macrocytic anemia: Likely due to alcohol  Severe protein caloric malnutrition: Ensure 3 times a day once able to take orals.  DVT prophylaxis: Heparin Family Communication:none Disposition Plan/Barrier to D/C: none Code Status:     Code Status Orders        Start     Ordered   10/05/17 0153  Full code  Continuous     10/05/17 0158    Code Status History    Date Active Date Inactive Code Status Order ID Comments User Context   04/09/2017  8:20 PM 04/13/2017  7:04 PM Full Code [id number]  Michael Litter, MD ED   02/11/2017  8:51 AM 02/12/2017  5:05 PM Full Code [id number]  Haydee Salter, MD ED   01/30/2017  3:05 AM 02/06/2017  9:22 PM Full Code [id number]  Danford, Earl Lites, MD Inpatient        IV Access:    Peripheral IV   Procedures and diagnostic studies:    No results found.   Medical Consultants:    None.  Anti-Infectives:   None  Subjective:    April [name] laid she continues to be nauseated.  Objective:    Vitals:   10/06/17 0441 10/06/17 1438 10/06/17 2152 10/07/17 0505  BP: (!) 146/82 (!) 142/80 139/78 (!) 147/84  Pulse: 96 (!) 105 98 (!) 109  Resp: 18 18 18 18   Temp: 98.6 F (37 C) 98.2 F (36.8 C) 98.1 F (36.7 C) 98.6 F (37 C)  TempSrc: Oral Oral Oral Oral  SpO2: 96% 100% 98% 98%  Weight:      Height:        Intake/Output Summary (Last 24 hours) at 10/07/17 1015 Last data filed at 10/07/17 5284  Gross per 24 hour  Intake             1900 ml  Output                0 ml  Net             1900 ml   Filed Weights   10/04/17 1713  Weight: 52.2 kg (115 lb)    Exam: General exam: Cachectic in no acute distress Respiratory system: Good air movement and clear to auscultation. Cardiovascular system: Regular in rhythm with positive S1-S2 Gastrointestinal system: Positive bowel sounds abdomen is soft with epigastric tenderness with guarding. Central nervous system: Alert and oriented. No focal neurological  deficits. Extremities: No pedal edema. Skin: No rashes, lesions or ulcers Psychiatry: Judgement and insight appear normal. Mood & affect appropriate.    Data Reviewed:    Labs: Basic Metabolic Panel:  Recent Labs Lab 10/04/17 1855 10/05/17 0517 10/06/17 0537  NA 139 137 134*  K 4.4 3.4* 3.2*  CL 105 101 100*  CO2 24 26 24   GLUCOSE 95 87 74  BUN 11 10 7   CREATININE 0.49 0.43* 0.31*  CALCIUM 8.5* 8.2* 7.8*  MG  --   --  1.5*   GFR Estimated Creatinine Clearance: 53.9 mL/min (A) (by C-G formula based on SCr of 0.31 mg/dL (L)). Liver Function Tests:  Recent Labs Lab 10/04/17 1855 10/05/17 0517 10/06/17 0537  AST 63* 53* 58*  ALT 16 21 21   ALKPHOS 158* 171* 157*  BILITOT 1.0 1.1 1.2  PROT 5.3* 5.6* 5.0*  ALBUMIN 2.5* 2.7* 2.3*    Recent Labs Lab 10/04/17 1855  10/06/17 0537  LIPASE 129* 96*   No results for input(s): AMMONIA in the last 168 hours. Coagulation profile  Recent Labs Lab 10/05/17 0219  INR 1.07    CBC:  Recent Labs Lab 10/04/17 1812 10/05/17 0517 10/06/17 0537  WBC 9.6 6.0 5.9  NEUTROABS  --   --  4.0  HGB 12.7 11.8* 10.7*  HCT 37.1 35.7* 30.7*  MCV 107.2* 108.8* 107.7*  PLT 385 318 286   Cardiac Enzymes: No results for input(s): CKTOTAL, CKMB, CKMBINDEX, TROPONINI in the last 168 hours. BNP (last 3 results) No results for input(s): PROBNP in the last 8760 hours. CBG: No results for input(s): GLUCAP in the last 168 hours. D-Dimer: No results for input(s): DDIMER in the last 72 hours. Hgb A1c: No results for input(s): HGBA1C in the last 72 hours. Lipid Profile: No results for input(s): CHOL, HDL, LDLCALC, TRIG, CHOLHDL, LDLDIRECT in the last 72 hours. Thyroid function studies: No results for input(s): TSH, T4TOTAL, T3FREE, THYROIDAB in the last 72 hours.  Invalid input(s): FREET3 Anemia work up:  Recent Labs  10/05/17 0517  VITAMINB12 459   Sepsis Labs:  Recent Labs Lab 10/04/17 1812 10/05/17 0517 10/06/17 0537  WBC 9.6 6.0 5.9   Microbiology No results found for this or any previous visit (from the past 240 hour(s)).   Medications:   . enoxaparin (LOVENOX) injection  1 mg/kg Subcutaneous Q12H  . feeding supplement (ENSURE ENLIVE)  237 mL Oral BID BM  . folic acid  1 mg Oral Daily  . LORazepam  0-4 mg Intravenous Q12H  . multivitamin with minerals  1 tablet Oral Daily  . nicotine  21 mg Transdermal Daily  . thiamine  100 mg Oral Daily   Or  . thiamine  100 mg Intravenous Daily   Continuous Infusions:     LOS: 2 days   April [name], April [name]  [facility] Pager [phone]  *Please refer to [email or web address], password [email or web address] to get updated schedule on who will round on this patient, as hospitalists switch teams weekly. If 7PM-7AM, please contact night-coverage at [email or web address],  password [email or web address] for any overnight needs.  10/07/2017, 10:15 AM

## 2017-10-07 NOTE — Plan of Care (Signed)
Problem: Safety: Goal: Ability to remain free from injury will improve Outcome: Not Progressing Patient has been redirected multiple times for not calling to go to the BR  Problem: Pain Managment: Goal: General experience of comfort will improve Outcome: Progressing Denies pain  Problem: Activity: Goal: Risk for activity intolerance will decrease Outcome: Progressing Up to BR with stand by assistance  Problem: Bowel/Gastric: Goal: Will not experience complications related to bowel motility Outcome: Not Progressing Medicated once for N/V

## 2017-10-08 LAB — BASIC METABOLIC PANEL
ANION GAP: 14 (ref 5–15)
ANION GAP: 16 — AB (ref 5–15)
BUN: 8 mg/dL (ref 6–20)
BUN: 11 mg/dL (ref 6–20)
CALCIUM: 8.5 mg/dL — AB (ref 8.9–10.3)
CALCIUM: 8.7 mg/dL — AB (ref 8.9–10.3)
CO2: 21 mmol/L — ABNORMAL LOW (ref 22–32)
CO2: 18 mmol/L — AB (ref 22–32)
CREATININE: 0.4 mg/dL — AB (ref 0.44–1.00)
Chloride: 101 mmol/L (ref 101–111)
Chloride: 104 mmol/L (ref 101–111)
Creatinine, Ser: 0.4 mg/dL — ABNORMAL LOW (ref 0.44–1.00)
GFR calc Af Amer: >60 mL/min (ref >60)
GFR calc non Af Amer: >60 mL/min (ref >60)
GLUCOSE: 136 mg/dL — AB (ref 65–99)
Glucose, Bld: 112 mg/dL — ABNORMAL HIGH (ref 65–99)
POTASSIUM: 4.1 mmol/L (ref 3.5–5.1)
Potassium: 2.7 mmol/L — CL (ref 3.5–5.1)
SODIUM: 136 mmol/L (ref 135–145)
Sodium: 138 mmol/L (ref 135–145)

## 2017-10-08 MED ORDER — POTASSIUM CHLORIDE 20 MEQ/15ML (10%) PO SOLN
20.0000 meq | Freq: Once | ORAL | Status: DC
  Administered 2017-10-08: 20 meq via ORAL
  Filled 2017-10-08: qty 15

## 2017-10-08 MED ORDER — POTASSIUM CHLORIDE IN NACL 40-0.9 MEQ/L-% IV SOLN
INTRAVENOUS | Status: DC
  Administered 2017-10-08: 100 mL/h via INTRAVENOUS
  Filled 2017-10-08: qty 1000

## 2017-10-08 MED ORDER — PNEUMOCOCCAL VAC POLYVALENT 25 MCG/0.5ML IJ INJ: 0.5000 mL | INJECTION | INTRAMUSCULAR | Status: DC

## 2017-10-08 MED ORDER — POTASSIUM CHLORIDE CRYS ER 20 MEQ PO TBCR
40.0000 meq | EXTENDED_RELEASE_TABLET | Freq: Three times a day (TID) | ORAL | Status: DC
  Administered 2017-10-08: 40 meq via ORAL
  Filled 2017-10-08: qty 2

## 2017-10-08 MED ORDER — SODIUM CHLORIDE 0.9 % IV SOLN: INTRAVENOUS | Status: DC

## 2017-10-08 MED ORDER — MAGNESIUM OXIDE 400 (241.3 MG) MG PO TABS
400.0000 mg | ORAL_TABLET | Freq: Two times a day (BID) | ORAL | Status: DC
  Administered 2017-10-08: 400 mg via ORAL
  Filled 2017-10-08: qty 1

## 2017-10-08 NOTE — Care Management Important Message (Signed)
Important Message  Patient Details  Name: April PoissonDeborah S [name] MRN: [id number] Date of Birth: 06/27/1946   Medicare Important Message Given:  Yes    Caren MacadamFuller, Kwasi Joung 10/08/2017, 11:27 AMImportant Message  Patient Details  Name: April PoissonDeborah S [name] MRN: [id number] Date of Birth: 05/27/1946   Medicare Important Message Given:  Yes    Caren MacadamFuller, Rylea Selway 10/08/2017, 11:27 AM

## 2017-10-08 NOTE — Discharge Summary (Signed)
Physician Discharge Summary  April ERCK VWU:[id number] DOB: 04-05-1946 DOA: 10/04/2017  PCP: Richmond Campbell., PA-C  Admit date: 10/04/2017 Discharge date: 10/08/2017  Admitted From: Home Disposition:  Home  Recommendations for Outpatient Follow-up:  1. Follow up with PCP in 2-4 weeks check a CT scan of the abdomen and pelvis to reevaluate pseudocyst in 6 weeks. 2. Please obtain BMP/CBC in one week 3. Will counsel her about alcohol use.   Home Health:No Equipment/Devices:none  Discharge Condition:stable CODE STATUS:full Diet recommendation:  Regular  Brief/Interim Summary: 71 y.o. female Significant for essential hypertension, alcohol abuse and tobacco abuse comes in complaining of right-sided abdominal pain that started 2 days prior to admission accompanied by significant nausea and vomiting  Discharge Diagnoses:  Principal Problem:   Pancreatitis Active Problems:   Alcohol abuse   Tobacco abuse   Neuropathy, alcoholic (HCC)   Macrocytic anemia   Protein-calorie malnutrition, severe  Alcoholic pancreatitis with possible pseudocyst on imaging: She was admitted to the hospital start on IV fluids pain medication and platelet paced in by mouth. CT scan of the abdomen and pelvis was done that shows pseudocyst less than 6 c m. She was able to tolerate her diet she was discharged home. It is to note that her family was pink her alcohol during her hospital stay, then after that she started vomiting. This was explained to the patient that it was not allowed in the hospital. her diet was advanced and she tolerated well. She'll follow-up with her PCP in 2-4 weeks  Alcohol abuse with peripheral neuropathy: She had no signs of withdrawals, she was started on thiamine and she will continue as an outpatient.  Macrocytic anemia: Likely due to alcohol.  Severe protein caloric malnutrition: She will continue ensure 3 times a day.  Discharge Instructions  Discharge  Instructions    Diet - low sodium heart healthy    Complete by:  As directed    Increase activity slowly    Complete by:  As directed      Allergies as of 10/08/2017      Reactions   Naltrexone Shortness Of Breath   Penicillins Rash   Has patient had a PCN reaction causing immediate rash, facial/tongue/throat swelling, SOB or lightheadedness with hypotension: Yes immediate rash  Has patient had a PCN reaction causing severe rash involving mucus membranes or skin necrosis: Yes Has patient had a PCN reaction that required hospitalization": No Has patient had a PCN reaction occurring within the last 10 years: Yes If all of the above answers are "NO", then may proceed with Cephalosporin use.      Medication List    TAKE these medications   amitriptyline 25 MG tablet Commonly known as:  ELAVIL Take 25 mg by mouth at bedtime as needed for sleep.   clonazePAM 0.5 MG tablet Commonly known as:  KLONOPIN Take 0.5 mg by mouth daily. as directed   cyanocobalamin 1000 MCG tablet Take 1 tablet (1,000 mcg total) by mouth daily.   gabapentin 300 MG capsule Commonly known as:  NEURONTIN Take 2 capsules (600 mg total) by mouth 3 (three) times daily.   KLOR-CON M20 20 MEQ tablet Generic drug:  potassium chloride SA Take 20 mEq by mouth daily.   multivitamin with minerals Tabs tablet Take 1 tablet by mouth daily.   Vitamin D (Ergocalciferol) 50000 units Caps capsule Commonly known as:  DRISDOL Take 50,000 Units by mouth once a week.       Allergies  Allergen Reactions  .  Naltrexone Shortness Of Breath  . Penicillins Rash    Has patient had a PCN reaction causing immediate rash, facial/tongue/throat swelling, SOB or lightheadedness with hypotension: Yes immediate rash  Has patient had a PCN reaction causing severe rash involving mucus membranes or skin necrosis: Yes Has patient had a PCN reaction that required hospitalization": No Has patient had a PCN reaction occurring within  the last 10 years: Yes If all of the above answers are "NO", then may proceed with Cephalosporin use.     Consultations:  None   Procedures/Studies: Ct Abdomen Pelvis W Contrast  Result Date: 10/04/2017 CLINICAL DATA:  Abdominal pain EXAM: CT ABDOMEN AND PELVIS WITH CONTRAST TECHNIQUE: Multidetector CT imaging of the abdomen and pelvis was performed using the standard protocol following bolus administration of intravenous contrast. CONTRAST:  ISOVUE-300 IOPAMIDOL (ISOVUE-300) INJECTION 61%, 30mL ISOVUE-300 IOPAMIDOL (ISOVUE-300) INJECTION 61% COMPARISON:  04/10/2017 FINDINGS: Lower chest:  Unremarkable. Hepatobiliary: The liver shows diffusely decreased attenuation suggesting steatosis. Liver is enlarged measuring 21 cm craniocaudal length. Gallbladder surgically absent. No substantial intra or extrahepatic biliary duct dilatation. Pancreas: 4.7 x 3.6 x 3.3 cm multi a lobular complex cystic lesion identified in the anterior head of pancreas extending anteriorly to displace the gastric antrum and laterally to generate mass-effect on the proximal duodenum. Mild distention of the main pancreatic duct noted with other small cystic areas in the pancreatic head and body measuring in the 5-6 mm size range. There is peripancreatic edema and inflammation. Spleen: No splenomegaly. No focal mass lesion. Adrenals/Urinary Tract: No adrenal nodule or mass. Kidneys are unremarkable. No evidence for hydroureter. The urinary bladder appears normal for the degree of distention. Stomach/Bowel: Stomach is not distended despite mass-effect on the antrum by the above-described lesion. Mass-effect also noted on the duodenum. No small bowel wall thickening. No small bowel dilatation. The terminal ileum is normal. The appendix is normal. Colon is diffusely decompressed with diverticular changes noted in the left colon Vascular/Lymphatic: There is abdominal aortic atherosclerosis without aneurysm. Nonocclusive thrombus is  identified in the portal vein in the region of the porto splenic confluence (see images 28 and 29 of series 2 and image 65 of series 3). The splenic vein is markedly attenuated but appears patent. The celiac axis, splenic artery, and common hepatic artery are patent. The SMA is patent. Peripancreatic lymphadenopathy is identified in the region of the pancreatic head. There is hepatoduodenal ligament lymphadenopathy. No pelvic sidewall lymphadenopathy. Reproductive: Fibroid change noted in the uterus. There is no adnexal mass. Other: Small volume intraperitoneal free fluid noted. Musculoskeletal: Bone windows reveal no worrisome lytic or sclerotic osseous lesions. IMPRESSION: 1. Interval development of a 5 x 4 x 3 cm multi loculated apparently cystic lesion in the anterior pancreatic head, generating mass-effect and displacing the gastric antrum and proximal duodenum. Imaging features likely related to pseudocyst formation from pancreatitis. Other tiny cystic foci are seen in the head and body of pancreas without substantial dilatation of the main pancreatic duct. There is associated peripancreatic edema/inflammation. Imaging features are consistent with pancreatitis. Excluded, close follow-up recommended. As pancreatic head neoplasm is not 2. Interval development of nonocclusive thrombus in the portal vein, near the porta splenic confluence. The splenic vein is markedly attenuated but does appear to be patent on the current exam. 3. Peripancreatic and hepatoduodenal ligament lymphadenopathy. 4. Hepatomegaly with hepatic steatosis. 5.  Aortic Atherosclerois (ICD10-170.0) Electronically Signed   By: Kennith Center M.D.   On: 10/04/2017 21:04     Subjective: No complaints  she would like to eat.  Discharge Exam: Vitals:   10/07/17 2003 10/08/17 0620  BP: (!) 141/86 130/82  Pulse: (!) 118 (!) 103  Resp: 18 18  Temp:  98.7 F (37.1 C)  SpO2: 99% 97%   Vitals:   10/07/17 0505 10/07/17 1344 10/07/17 2003  10/08/17 0620  BP: (!) 147/84 136/81 (!) 141/86 130/82  Pulse: (!) 109 (!) 106 (!) 118 (!) 103  Resp: 18 18 18 18   Temp: 98.6 F (37 C) 99.3 F (37.4 C)  98.7 F (37.1 C)  TempSrc: Oral Oral Oral Oral  SpO2: 98% 98% 99% 97%  Weight:      Height:        General: Pt is alert, awake, not in acute distress Cardiovascular: RRR, S1/S2 +, no rubs, no gallops Respiratory: CTA bilaterally, no wheezing, no rhonchi Abdominal: Soft, NT, ND, bowel sounds + Extremities: no edema, no cyanosis    The results of significant diagnostics from this hospitalization (including imaging, microbiology, ancillary and laboratory) are listed below for reference.     Microbiology: No results found for this or any previous visit (from the past 240 hour(s)).   Labs: BNP (last 3 results) No results for input(s): BNP in the last 8760 hours. Basic Metabolic Panel:  Recent Labs Lab 10/04/17 1855 10/05/17 0517 10/06/17 0537 10/07/17 1003 10/08/17 0540  NA 139 137 134* 135 136  K 4.4 3.4* 3.2* 3.6 2.7*  CL 105 101 100* 97* 101  CO2 24 26 24  18* 21*  GLUCOSE 95 87 74 99 112*  BUN 11 10 7 6 8   CREATININE 0.49 0.43* 0.31* 0.38* 0.40*  CALCIUM 8.5* 8.2* 7.8* 8.4* 8.5*  MG  --   --  1.5*  --   --    Liver Function Tests:  Recent Labs Lab 10/04/17 1855 10/05/17 0517 10/06/17 0537  AST 63* 53* 58*  ALT 16 21 21   ALKPHOS 158* 171* 157*  BILITOT 1.0 1.1 1.2  PROT 5.3* 5.6* 5.0*  ALBUMIN 2.5* 2.7* 2.3*    Recent Labs Lab 10/04/17 1855 10/06/17 0537  LIPASE 129* 96*   No results for input(s): AMMONIA in the last 168 hours. CBC:  Recent Labs Lab 10/04/17 1812 10/05/17 0517 10/06/17 0537  WBC 9.6 6.0 5.9  NEUTROABS  --   --  4.0  HGB 12.7 11.8* 10.7*  HCT 37.1 35.7*  36.1 30.7*  MCV 107.2* 108.8* 107.7*  PLT 385 318 286   Cardiac Enzymes: No results for input(s): CKTOTAL, CKMB, CKMBINDEX, TROPONINI in the last 168 hours. BNP: Invalid input(s): POCBNP CBG: No results for  input(s): GLUCAP in the last 168 hours. D-Dimer No results for input(s): DDIMER in the last 72 hours. Hgb A1c No results for input(s): HGBA1C in the last 72 hours. Lipid Profile No results for input(s): CHOL, HDL, LDLCALC, TRIG, CHOLHDL, LDLDIRECT in the last 72 hours. Thyroid function studies No results for input(s): TSH, T4TOTAL, T3FREE, THYROIDAB in the last 72 hours.  Invalid input(s): FREET3 Anemia work up No results for input(s): VITAMINB12, FOLATE, FERRITIN, TIBC, IRON, RETICCTPCT in the last 72 hours. Urinalysis    Component Value Date/Time   COLORURINE YELLOW 10/04/2017 1709   APPEARANCEUR HAZY (A) 10/04/2017 1709   LABSPEC 1.016 10/04/2017 1709   PHURINE 5.0 10/04/2017 1709   GLUCOSEU NEGATIVE 10/04/2017 1709   HGBUR NEGATIVE 10/04/2017 1709   BILIRUBINUR NEGATIVE 10/04/2017 1709   KETONESUR 5 (A) 10/04/2017 1709   PROTEINUR NEGATIVE 10/04/2017 1709   NITRITE NEGATIVE 10/04/2017  1709   LEUKOCYTESUR NEGATIVE 10/04/2017 1709   Sepsis Labs Invalid input(s): PROCALCITONIN,  WBC,  LACTICIDVEN Microbiology No results found for this or any previous visit (from the past 240 hour(s)).   Time coordinating discharge: Over 30 minutes  SIGNED:   Marinda ElkFELIZ ORTIZ, Gentry Seeber, MD  [facility] 10/08/2017, 9:40 AM Pager   If 7PM-7AM, please contact night-coverage [email or web address] Password [email or web address]

## 2017-10-08 NOTE — Progress Notes (Signed)
Pt discharged home. Discharge instruction givens and pt verbalized understanding. VSS.

## 2017-10-08 NOTE — Progress Notes (Signed)
Physical Therapy Treatment Patient Details Name: April [name] MRN: [id number] DOB: 1946-05-19 Today's Date: 10/08/2017    History of Present Illness 71 y.o. female with medical history significant of HTN, HLD, pancreatitis, alcohol abuse, and tobacco abuse; who presents with complaints of right-sided abdominal pain for last 2 days.     PT Comments    Pt continues to participate well with therapy. Continue to recommend RW use for ambulation safety   Follow Up Recommendations  Outpatient PT(for balance);Supervision for mobility/OOB      Equipment Recommendations  None recommended by PT    Recommendations for Other Services       Precautions / Restrictions Precautions Precautions: Fall Precaution Comments:  2 falls in past 2 months; total of 3 in past year Restrictions Weight Bearing Restrictions: No    Mobility  Bed Mobility Overal bed mobility: Modified Independent                Transfers Overall transfer level: Needs assistance Equipment used: Rolling walker (2 wheeled) Transfers: Sit to/from Stand Sit to Stand: Supervision         General transfer comment: VCs hand placement; dizzy intially upon standing but this quickly resolved, pt reports this is baseline  Ambulation/Gait Ambulation/Gait assistance: Supervision Ambulation Distance (Feet): 150 Feet Assistive device: Rolling walker (2 wheeled) Gait Pattern/deviations: Step-through pattern     General Gait Details: Pt continues to do well with use of RW. Slow gait speed.    Stairs            Wheelchair Mobility    Modified Rankin (Stroke Patients Only)       Balance                                            Cognition Arousal/Alertness: Awake/alert Behavior During Therapy: WFL for tasks assessed/performed Overall Cognitive Status: Within Functional Limits for tasks assessed                                        Exercises General Exercises  - Lower Extremity Long Arc Quad: AROM;Both;10 reps;Seated Hip Flexion/Marching: AROM;Both;5 reps;Standing Heel Raises: AROM;Both;10 reps;Standing    General Comments        Pertinent Vitals/Pain Pain Assessment: 0-10 Pain Score: 3  Pain Location:  abdominal pain Pain Descriptors / Indicators: Sore Pain Intervention(s): Limited activity within patient's tolerance;Repositioned    Home Living                      Prior Function            PT Goals (current goals can now be found in the care plan section) Progress towards PT goals: Progressing toward goals    Frequency    Min 3X/week      PT Plan Current plan remains appropriate    Co-evaluation              AM-PAC PT "6 Clicks" Daily Activity  Outcome Measure  Difficulty turning over in bed (including adjusting bedclothes, sheets and blankets)?: None Difficulty moving from lying on back to sitting on the side of the bed? : None Difficulty sitting down on and standing up from a chair with arms (e.g., wheelchair, bedside commode, etc,.)?: A Little Help needed moving to and from  a bed to chair (including a wheelchair)?: A Little Help needed walking in hospital room?: A Little Help needed climbing 3-5 steps with a railing? : A Little 6 Click Score: 20    End of Session Equipment Utilized During Treatment: Gait belt Activity Tolerance: Patient tolerated treatment well Patient left: in chair;with call bell/phone within reach;with chair alarm set   PT Visit Diagnosis: Difficulty in walking, not elsewhere classified (R26.2);History of falling (Z91.81)     Time: [id number] PT Time Calculation (min) (ACUTE ONLY): 9 min  Charges:  $Gait Training: 8-22 mins                    G Codes:          Rebeca AlertJannie Misael Mcgaha, MPT Pager: [phone]

## 2017-10-17 ENCOUNTER — Other Ambulatory Visit: Payer: Self-pay | Admitting: Neurology

## 2017-10-19 NOTE — Telephone Encounter (Signed)
Patient given one month supply. Needs to make an appt w/ our office for further refills.

## 2017-11-18 ENCOUNTER — Other Ambulatory Visit: Payer: Self-pay | Admitting: Neurology

## 2017-11-29 ENCOUNTER — Other Ambulatory Visit: Payer: Self-pay | Admitting: Neurology

## 2017-12-02 ENCOUNTER — Other Ambulatory Visit: Payer: Self-pay | Admitting: Neurology

## 2017-12-06 ENCOUNTER — Telehealth: Payer: Self-pay | Admitting: Neurology

## 2017-12-06 MED ORDER — GABAPENTIN 300 MG PO CAPS: 600.0000 mg | ORAL_CAPSULE | Freq: Three times a day (TID) | ORAL | 0 refills | Status: DC

## 2017-12-06 NOTE — Telephone Encounter (Signed)
I called the patient.  The patient has not been seen to the office in September 2017.  On the last refill of gabapentin, she was asked to make a revisit appointment and did not.  I will send in a 1 week supply of medication, she will need to make a revisit.

## 2017-12-16 ENCOUNTER — Other Ambulatory Visit: Payer: Self-pay | Admitting: *Deleted

## 2017-12-16 MED ORDER — GABAPENTIN 300 MG PO CAPS: 600.0000 mg | ORAL_CAPSULE | Freq: Three times a day (TID) | ORAL | 1 refills | Status: DC

## 2017-12-16 NOTE — Telephone Encounter (Signed)
Pt's daughter [name] ([place] called and said that her mother fell this morning. She denies any difficulty talking, is unable to determine if she suffered any injury. I advised if pt injured or having difficulty communicating call 911. Otherwise, can get checked out at urgent care or ED. Daughter verbalized understanding. She also requested a refill of Gabapentin and is aware that she needs an appointment for further refills. I scheduled the patient for soonest available on 02/01/18 @ 09:30 arrival time 09:00 and refilled with enough medication to get her through the appt. Daughter verbalized understanding. I stressed importance of keeping this appointment for further refills.

## 2017-12-28 DIAGNOSIS — H6122 Impacted cerumen, left ear: Secondary | ICD-10-CM | POA: Diagnosis not present

## 2017-12-28 DIAGNOSIS — Z23 Encounter for immunization: Secondary | ICD-10-CM | POA: Diagnosis not present

## 2017-12-31 IMAGING — DX DG CHEST 2V
2 series · 2 of 2 positions shown · non-contrast
Comparison: None

CLINICAL DATA: Post laparoscopic cholecystectomy yesterday,
hypoxemia, postoperative fever, hypertension

EXAM:
CHEST  2 VIEW

[chest lat]
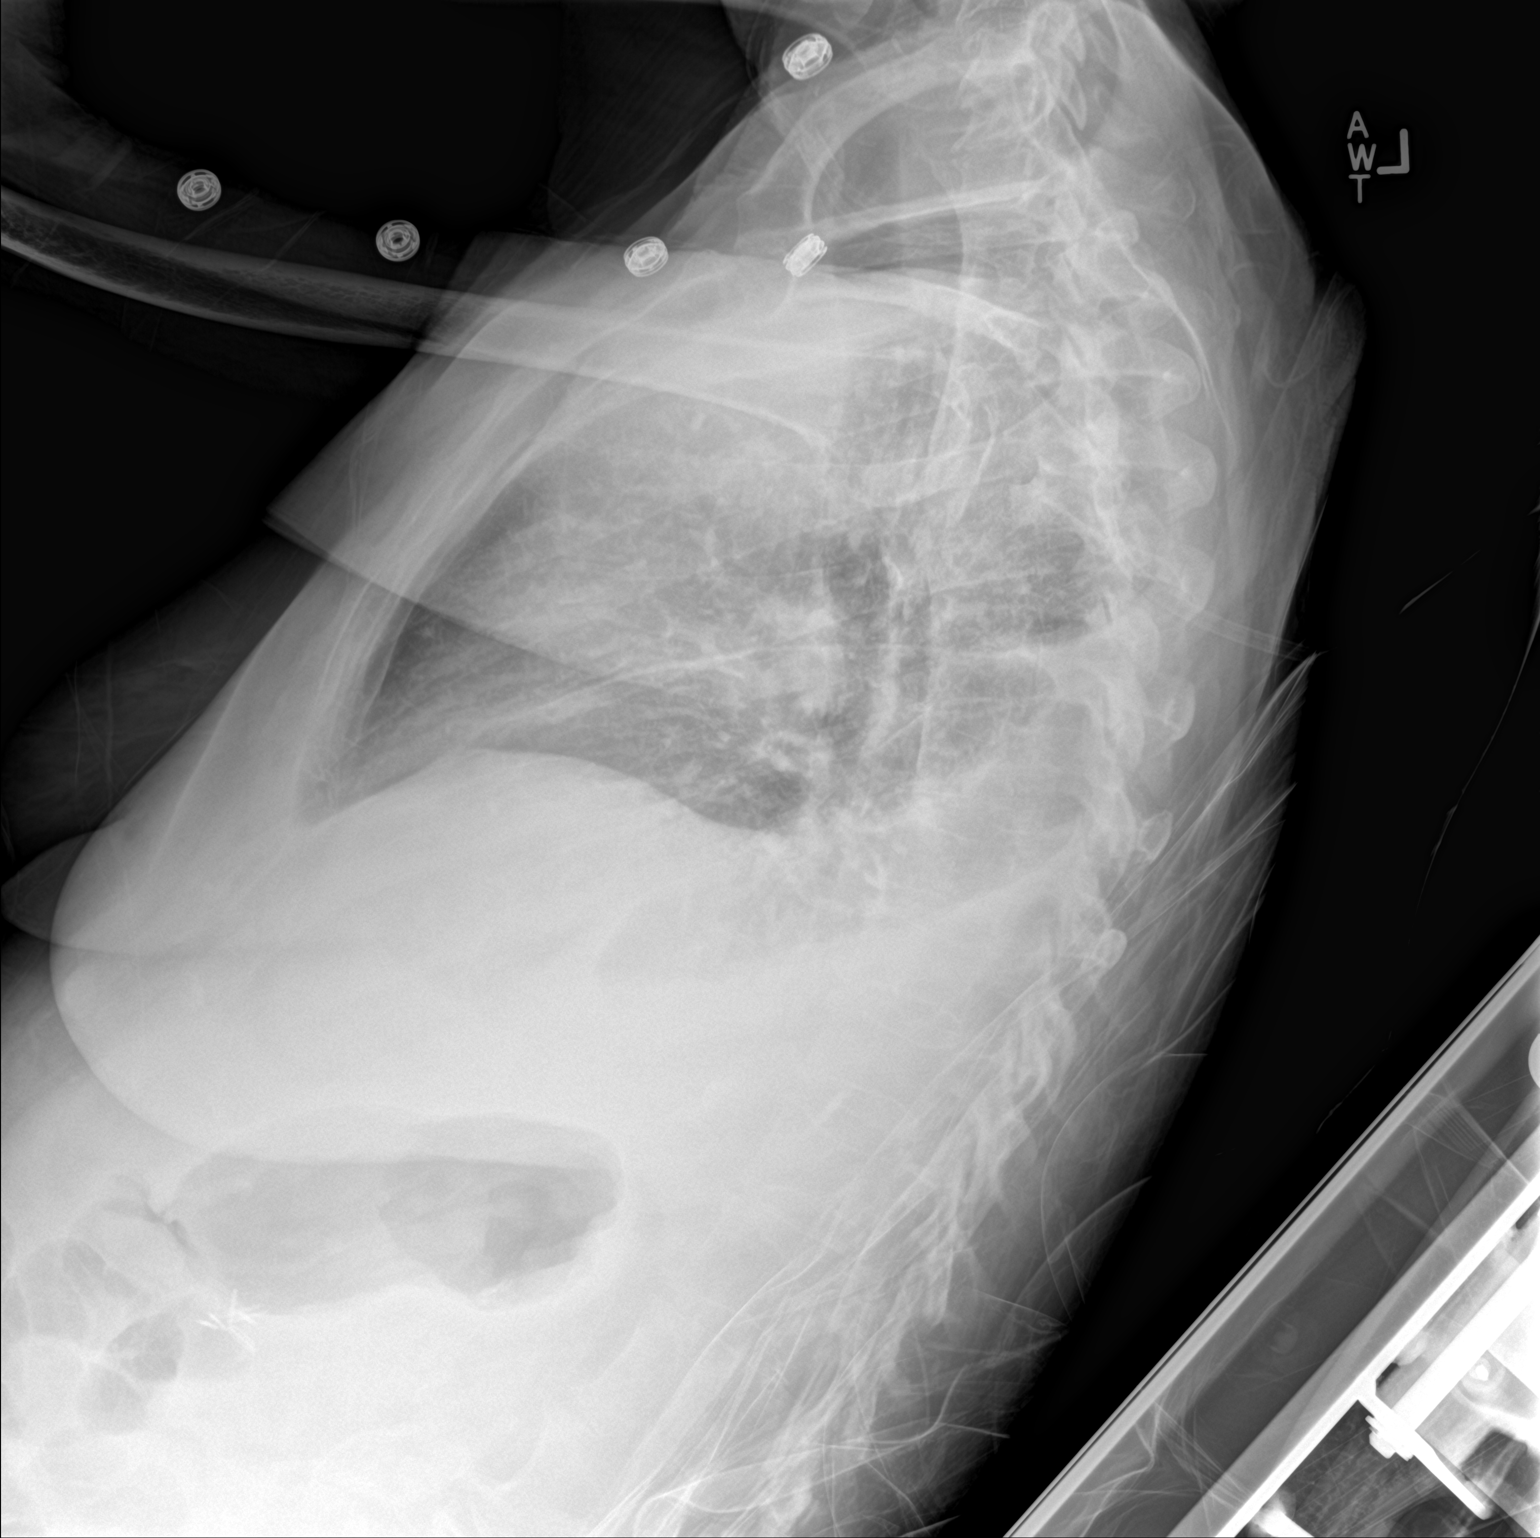

[chest ap]
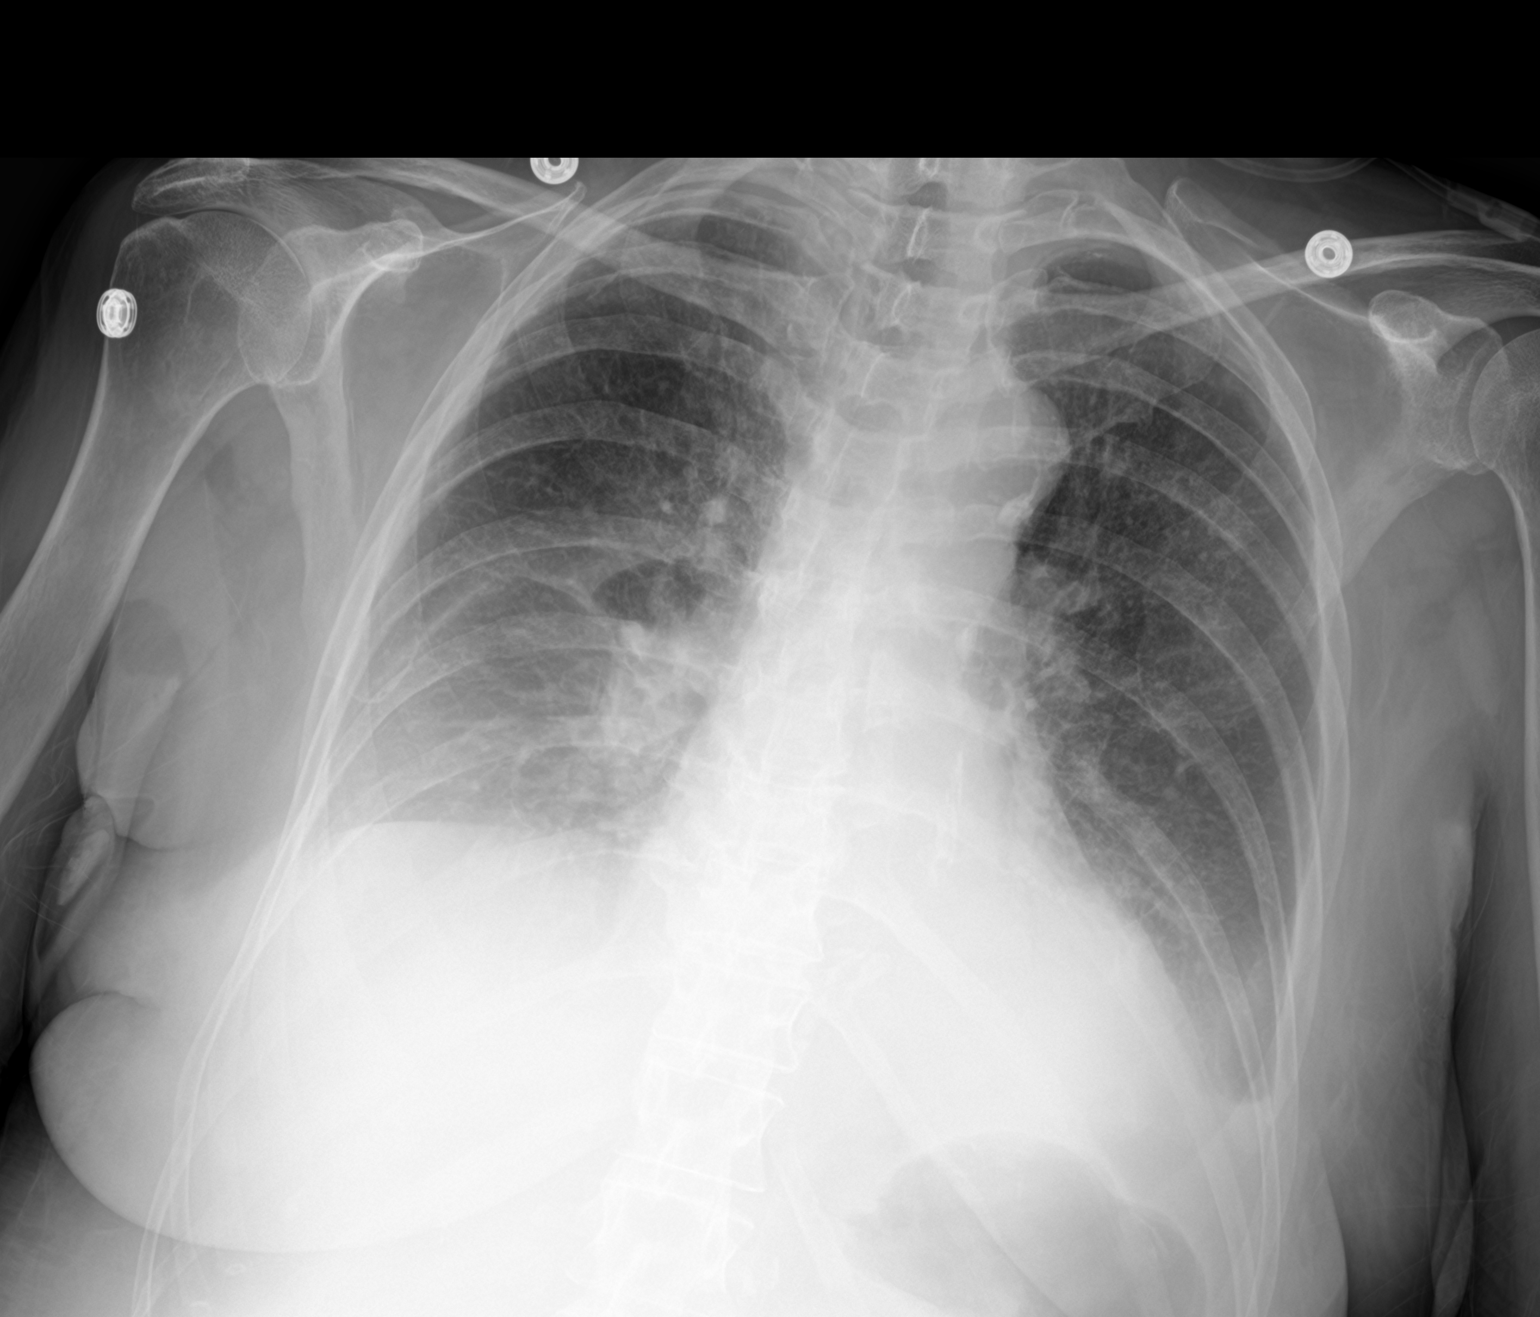

[2 of 2 positions shown; findings below may reference images not displayed]

FINDINGS: Upper normal heart size.

Atherosclerotic calcification aorta.

BILATERAL perihilar infiltrates and basilar pleural effusions.

Probable components compressive atelectasis in the lower lobes.

No pneumothorax.

Bones unremarkable.
IMPRESSION: Bibasilar pleural effusions and atelectasis with suspected mild
perihilar infiltrates which could represent edema or infection.

## 2018-01-31 ENCOUNTER — Ambulatory Visit: Payer: Self-pay | Admitting: Neurology

## 2018-01-31 ENCOUNTER — Telehealth: Payer: Self-pay | Admitting: *Deleted

## 2018-01-31 NOTE — Telephone Encounter (Signed)
Pt no showed f/u appt on 01/31/2018 @ 09:30.

## 2018-02-02 ENCOUNTER — Encounter: Payer: Self-pay | Admitting: Neurology

## 2018-02-14 ENCOUNTER — Other Ambulatory Visit: Payer: Self-pay | Admitting: Neurology

## 2018-02-15 ENCOUNTER — Other Ambulatory Visit: Payer: Self-pay | Admitting: Neurology

## 2018-03-05 IMAGING — CT CT HEAD W/O CM
4 series · 17 of 47 positions shown, 19 images · non-contrast
Comparison: February 11, 2017

CLINICAL DATA: Pain after fall.  Headache.

EXAM:
CT HEAD WITHOUT CONTRAST
TECHNIQUE: Contiguous axial images were obtained from the base of the skull
through the vertex without intravenous contrast.

[Series 3: head without · axial · non-contrast · 0.45mm/px · z∈[-183,-58]mm · 7 of 35 slices shown, 9 images]
[im 5/35  brain]
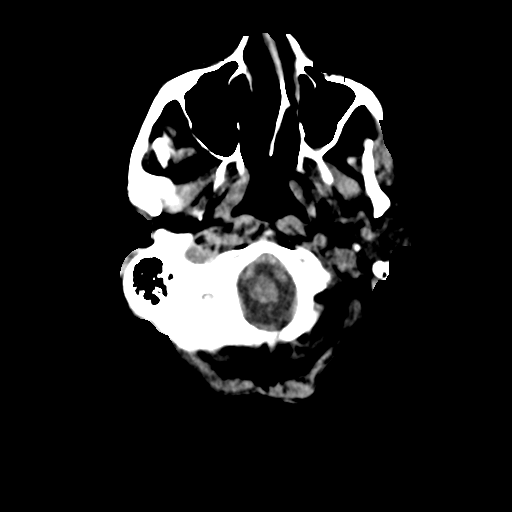
[im 5/35  bone]
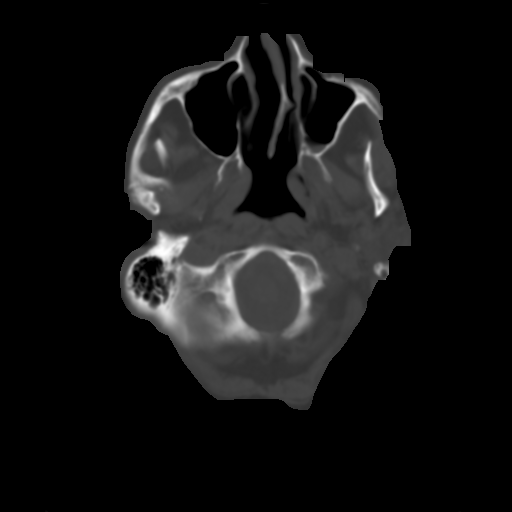
[im 9/35  brain]
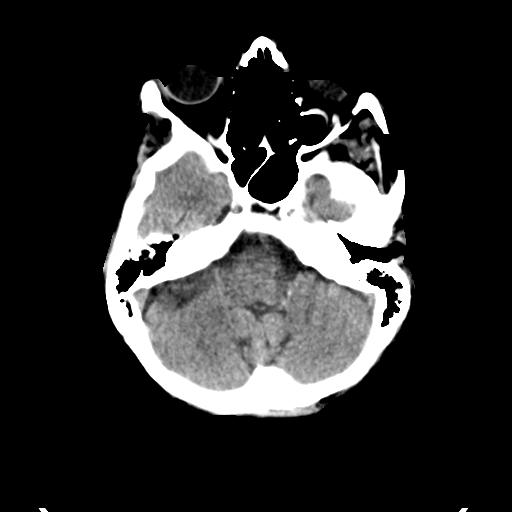
[im 13/35  brain]
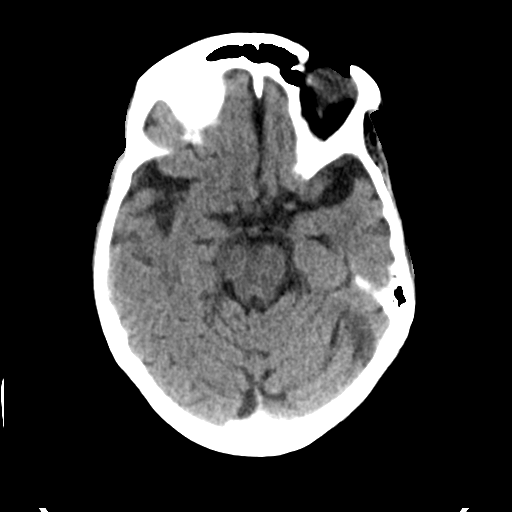
[im 18/35  brain]
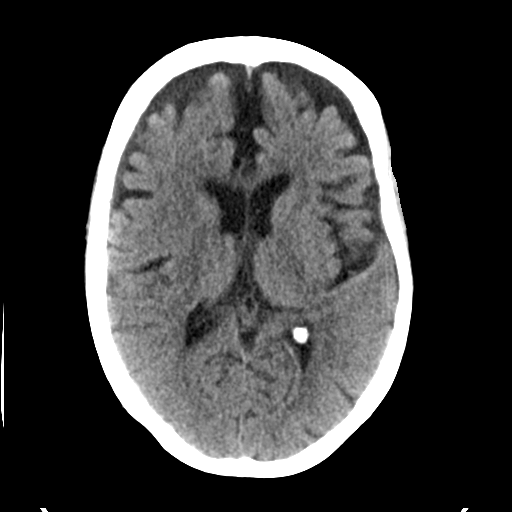
[im 22/35  brain]
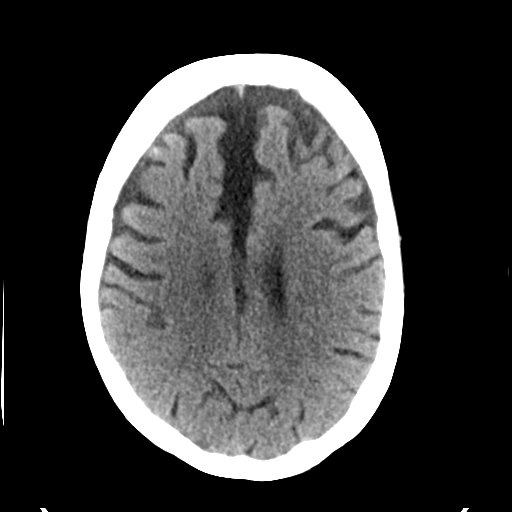
[im 22/35  bone]
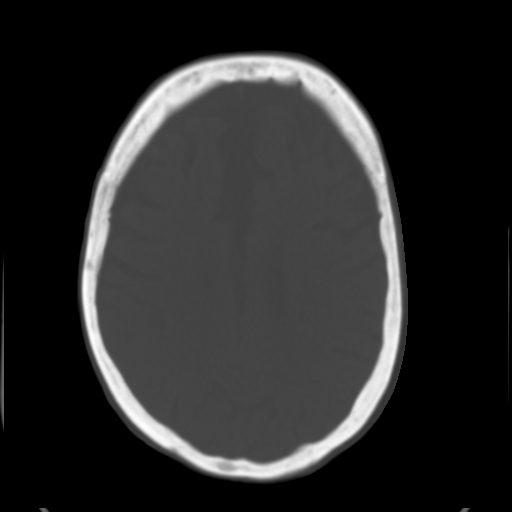
[im 26/35  brain]
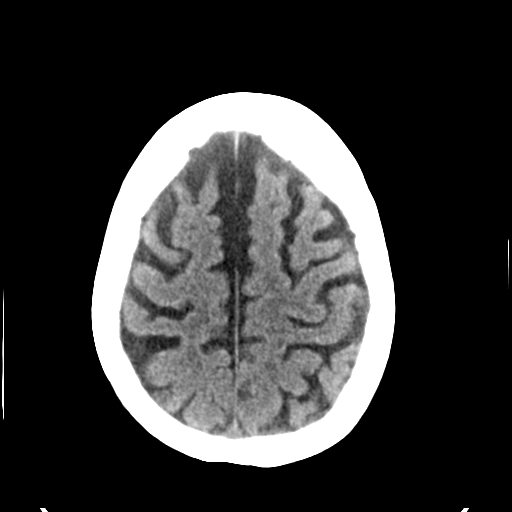
[im 30/35  brain]
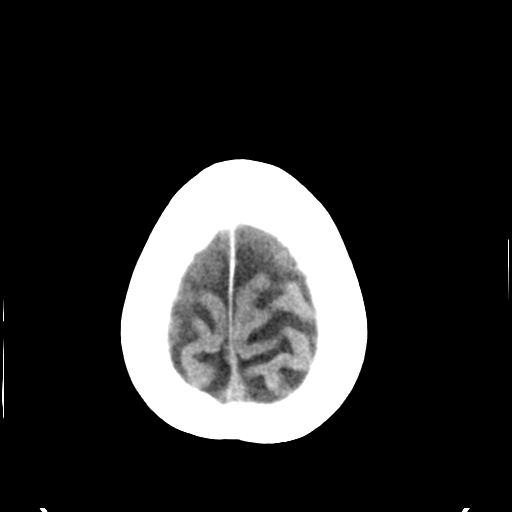

[Series 4: head bone · axial · 0.45mm/px · z∈[-187,-125]mm · 4 of 88 slices shown]
[im 9/88  bone]
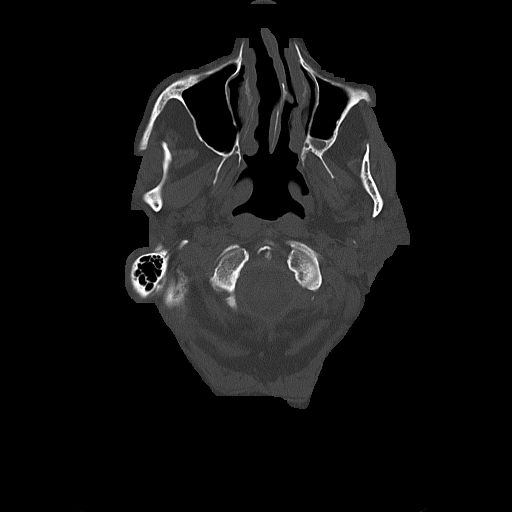
[im 18/88  bone]
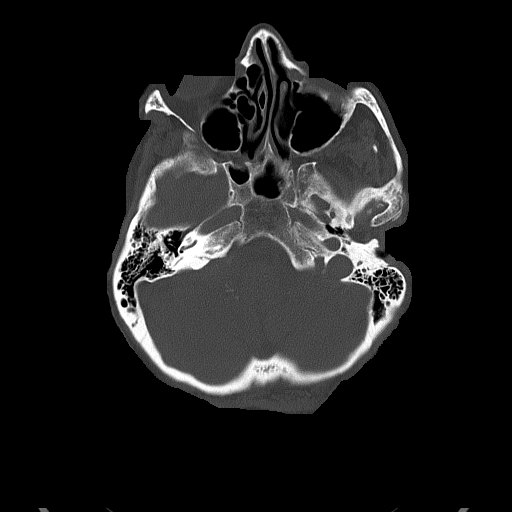
[im 27/88  bone]
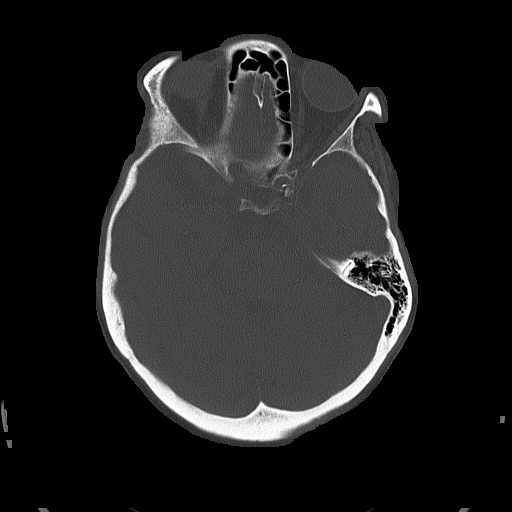
[im 40/88  bone]
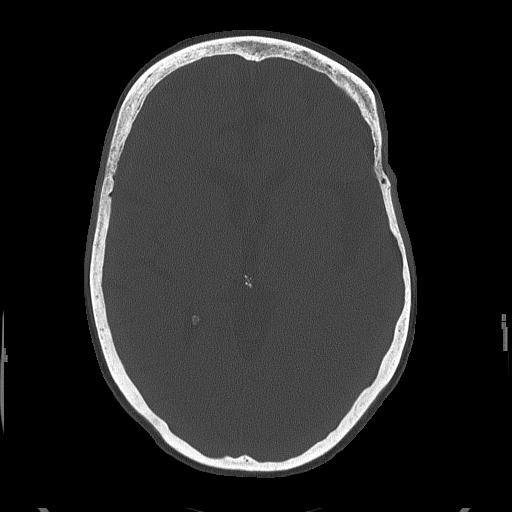

[Series 5: head without cor · coronal · non-contrast · 0.34mm/px · 3 of 70 slices shown]
[im 24/70  brain]
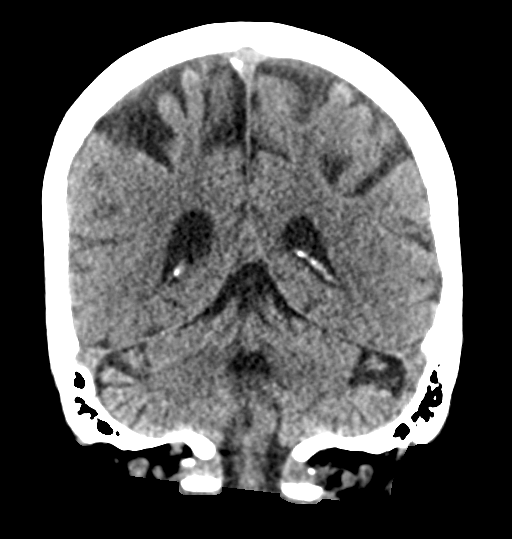
[im 31/70  brain]
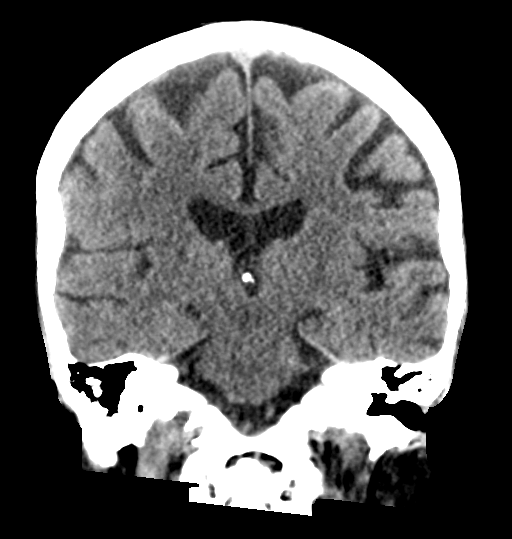
[im 39/70  brain]
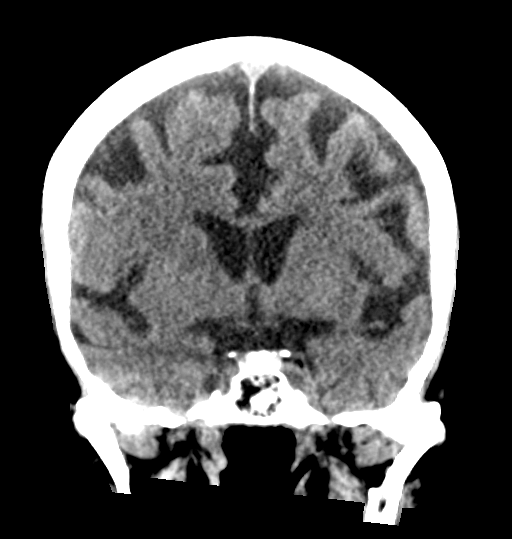

[Series 6: head without sag · sagittal · non-contrast · 0.35mm/px · 3 of 57 slices shown]
[im 19/57  brain]
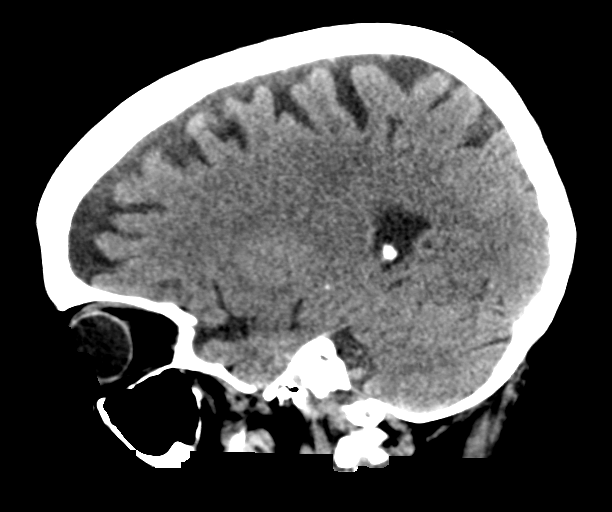
[im 28/57  brain]
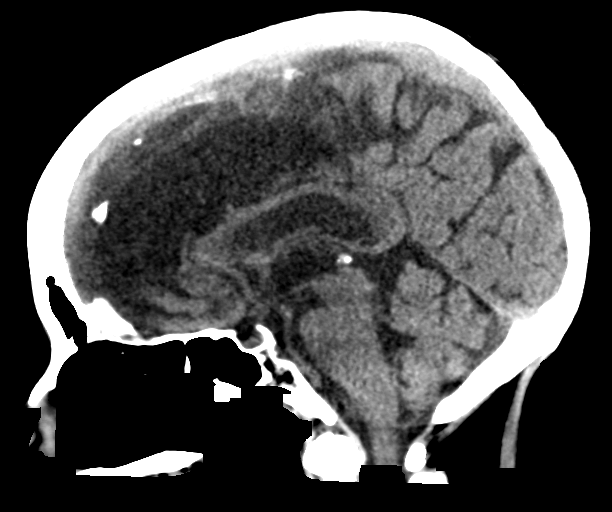
[im 37/57  brain]
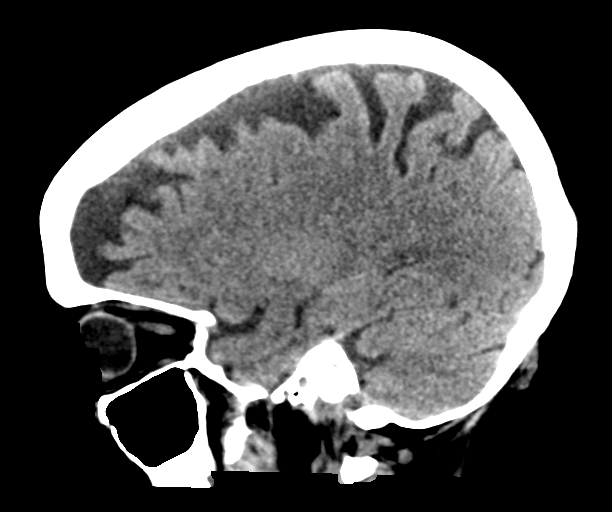

[17 of 47 positions shown; findings below may reference images not displayed]

FINDINGS: Brain: No subdural,, epidural, or subarachnoid hemorrhage.
Ventricular and sulcal prominence consistent with persistent
atrophy. Cerebellum, brainstem, and basal cisterns are normal. No
mass, mass effect, or midline shift. No acute cortical ischemia or
infarct.

Vascular: Calcified atherosclerosis is seen in the intra carotid
arteries.

Skull: No fractures are identified.

Sinuses/Orbits: Mucosal thickening is seen posteriorly in the left
maxillary sinus, unchanged since Saturday February, 2017. No acute paranasal
sinus abnormalities or changes. Mastoid air cells the middle ears
are well aerated.

Other: None.
IMPRESSION: No acute intracranial abnormalities.  Generalized atrophy is stable.

## 2018-03-06 IMAGING — CT CT ABD-PELV W/ CM
2 of 5 series · 13 of 46 positions shown, 15 images · IV contrast (APPLIED)
Comparison: Abdominal CT dated 01/30/2017

CLINICAL DATA: 70-year-old female with abdominal pain.

EXAM:
CT ABDOMEN AND PELVIS WITH CONTRAST
TECHNIQUE: Multidetector CT imaging of the abdomen and pelvis was performed
using the standard protocol following bolus administration of
intravenous contrast.
CONTRAST:  100mL RFKD5S-877 IOPAMIDOL (RFKD5S-877) INJECTION 61%

[Series 3: abd/ pelvis 5.0 i30f 2 · axial · 0.69mm/px · z∈[+976,+1366]mm · 10 of 92 slices shown, 12 images]
[im 9/92  soft-tissue]
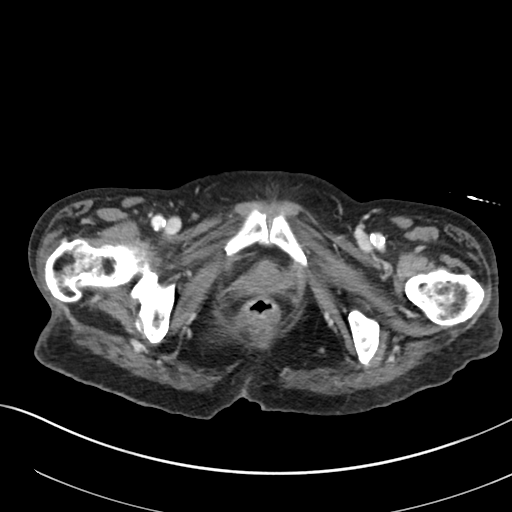
[im 9/92  bone]
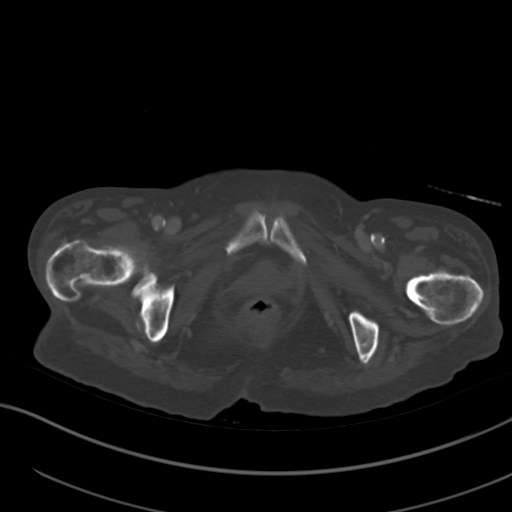
[im 18/92  soft-tissue]
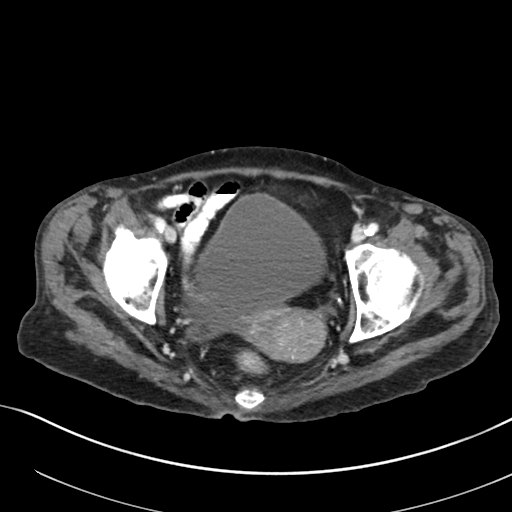
[im 27/92  soft-tissue]
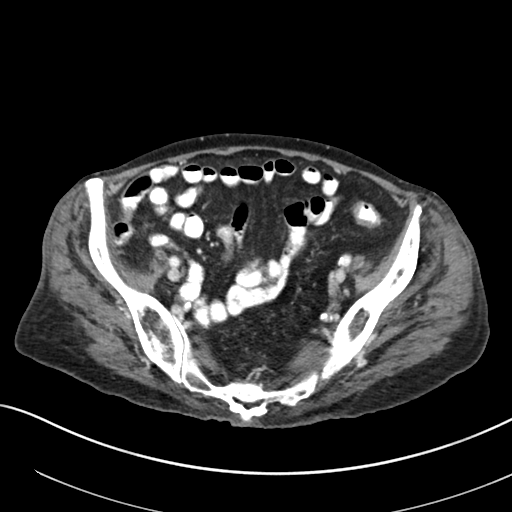
[im 35/92  soft-tissue]
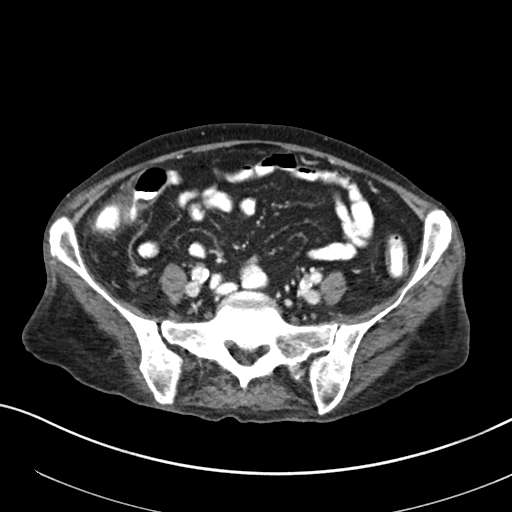
[im 44/92  soft-tissue]
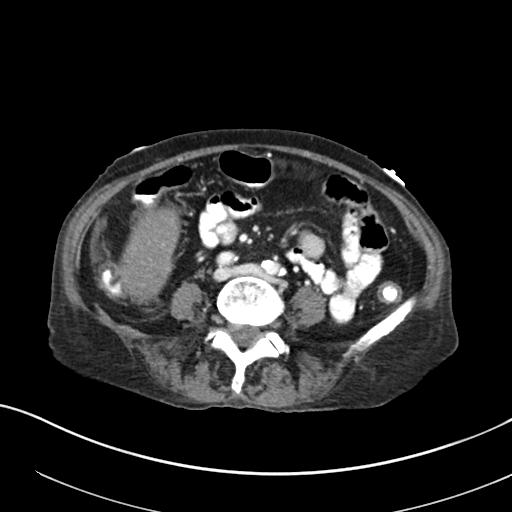
[im 53/92  soft-tissue]
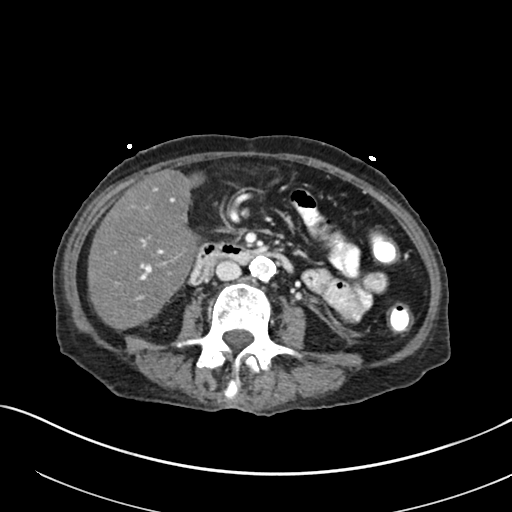
[im 61/92  soft-tissue]
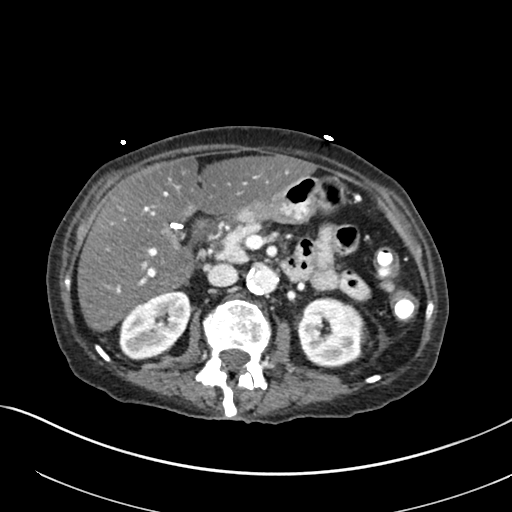
[im 70/92  soft-tissue]
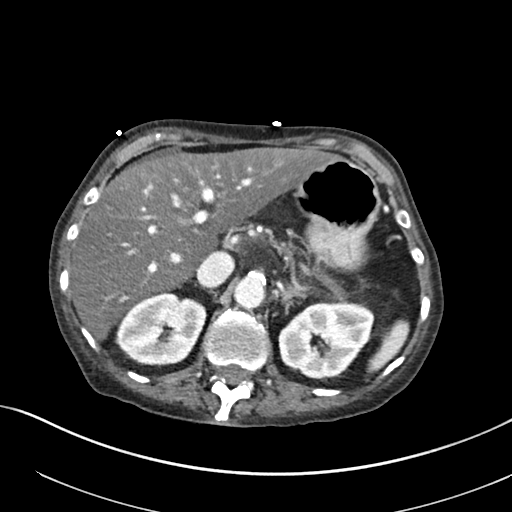
[im 79/92  soft-tissue]
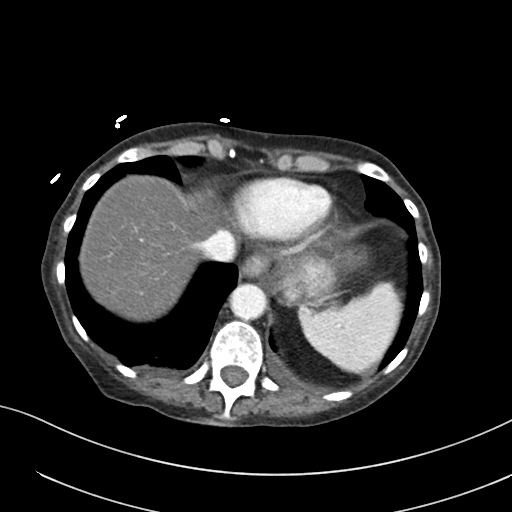
[im 79/92  bone]
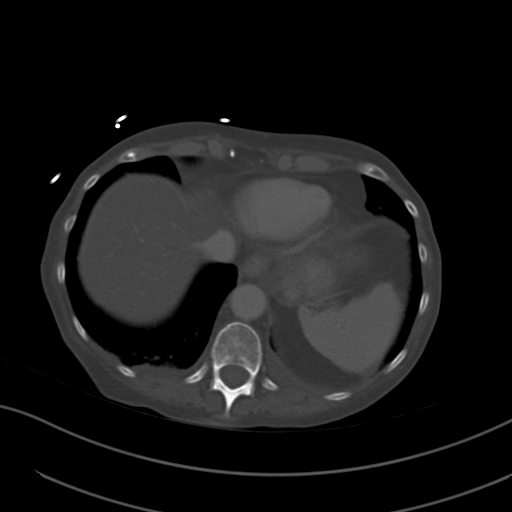
[im 87/92  soft-tissue]
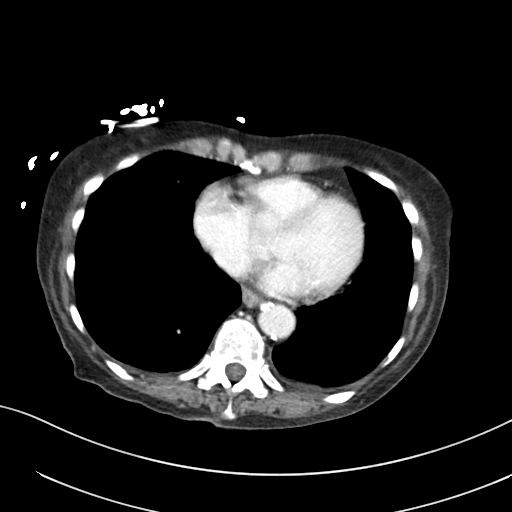

[Series 6: coronal soft tissue · coronal · 0.60mm/px · 3 of 76 slices shown]
[im 26/76  soft-tissue]
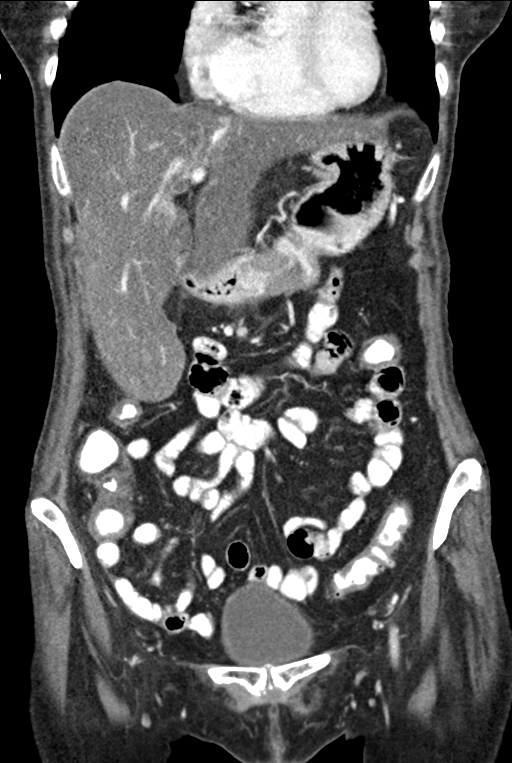
[im 34/76  soft-tissue]
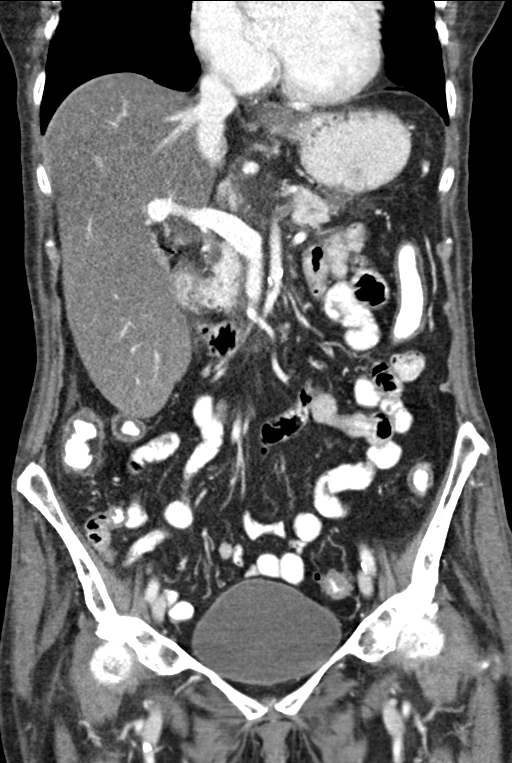
[im 42/76  soft-tissue]
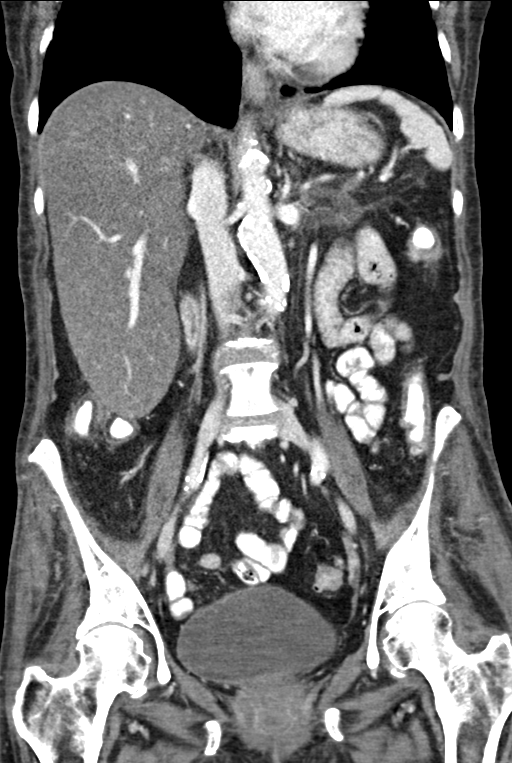

[13 of 46 positions shown; findings below may reference images not displayed]

FINDINGS: Lower chest: Partially visualized small bilateral pleural effusions.
There is minimal dependent atelectatic changes of the visualized
lung bases. Coronary vascular calcification involving the RCA.

No intra-abdominal free air.  Small ascites.

Hepatobiliary: There is fatty infiltration of the liver. There is
elongated and somewhat enlarged appearance of the right lobe of the
liver which may represent a riddle lobe variant but concerning for
steatohepatitis. Correlation with clinical exam and LFTs
recommended. There is no intrahepatic biliary ductal dilatation.
Cholecystectomy.

Pancreas: There is stranding and inflammatory changes of the
peripancreatic fat most consistent with pancreatitis. Correlation
with pancreatic enzymes recommended. No drainable fluid collection,
abscess, or pseudocyst.

Spleen: Normal in size without focal abnormality.

Adrenals/Urinary Tract: Stable appearing 11 mm left adrenal nodule,
indeterminate. The right adrenal gland appears unremarkable. The
kidneys, visualized ureters, and urinary bladder appear unremarkable
as well. There is symmetric and uniform uptake and enhancement of
the kidney and excretion of contrast into the collecting system.

Stomach/Bowel: There is extensive sigmoid diverticulosis with
muscular hypertrophy. No active inflammatory changes. There is
diffusely thickened appearance of the colonic wall which may be
reactive to acute pancreatitis or represent pancolitis. Correlation
with clinical exam and stool cultures recommended. There is no
evidence of bowel obstruction. The appendix is not visualized with
certainty, possibly surgically absent.

Vascular/Lymphatic: There is advanced aortoiliac atherosclerotic
disease. The aorta is tortuous and ectatic measuring up to 2.5 cm in
diameter inferior to the origin of the renal arteries. There is
focal area of severe narrowing of the aortic lumen secondary to
heavy atherosclerotic calcification just inferior to the level of
the renal arteries. The distal aorta however remains patent. Chest
there is advanced atherosclerotic calcification of the origins of
the celiac axis, SMA, IMA with narrowing of the origin of these
vessels. The mesenteric vessels however remain patent. There is also
atherosclerotic disease of the renal ostia which remain patent. The
IVC appear unremarkable. There is occlusion of the entire length of
the splenic vein from the splenic hilum to the Josee splenic
confluence sequela of pancreatitis. This finding is new compared to
the prior CT of 01/30/2017. The SMV, and the main portal vein are
patent. No portal venous gas identified. There is no adenopathy.

Reproductive: Probable left uterine fundal fibroid. The ovaries are
grossly unremarkable. No pelvic mass.

Other: None

Musculoskeletal: Osteopenia with degenerative changes of the spine.
No acute fracture.
IMPRESSION: 1. Acute pancreatitis.  No abscess.
2. Thrombosis of the splenic vein, new since the prior CT, and
sequela of pancreatitis. The spleen demonstrates a normal
enhancement on today's CT.
3. Severe fatty infiltration of the liver with findings concerning
for steatohepatitis. Correlation with clinical exam and LFTs
recommended.
4. Reactive inflammation of the colon versus pancolitis. Correlation
with clinical exam and stool cultures recommended. No bowel
obstruction.
5. Sigmoid diverticulosis without active inflammatory changes.
6. Small ascites.
7. Advanced atherosclerotic disease of the aorta with focal area of
high-grade stenosis of the infrarenal abdominal aorta with a 2.5 cm
ectatic segment, likely post stenotic ectasia.
8. Small bilateral pleural effusions.
These results were called by telephone at the time of interpretation
on 04/10/2017 at [DATE] to Dr. Seferino, who verbally acknowledged these
results.

## 2018-04-04 ENCOUNTER — Other Ambulatory Visit: Payer: Self-pay | Admitting: Neurology

## 2018-04-10 ENCOUNTER — Emergency Department (HOSPITAL_COMMUNITY)
Admission: EM | Admit: 2018-04-10 | Discharge: 2018-04-10 | Disposition: A | Discharge status: Home / Self Care | Payer: PPO | Attending: Emergency Medicine | Admitting: Emergency Medicine

## 2018-04-10 ENCOUNTER — Encounter (HOSPITAL_COMMUNITY): Payer: Self-pay | Admitting: Emergency Medicine

## 2018-04-10 ENCOUNTER — Emergency Department (HOSPITAL_COMMUNITY): Payer: PPO

## 2018-04-10 ENCOUNTER — Encounter (HOSPITAL_COMMUNITY): Payer: Self-pay

## 2018-04-10 ENCOUNTER — Other Ambulatory Visit: Payer: Self-pay

## 2018-04-10 ENCOUNTER — Emergency Department (HOSPITAL_COMMUNITY)
Admission: EM | Admit: 2018-04-10 | Discharge: 2018-04-11 | Disposition: A | Discharge status: Home / Self Care | Payer: PPO | Source: Home / Self Care

## 2018-04-10 DIAGNOSIS — M25552 Pain in left hip: Secondary | ICD-10-CM | POA: Insufficient documentation

## 2018-04-10 DIAGNOSIS — W06XXXA Fall from bed, initial encounter: Secondary | ICD-10-CM | POA: Insufficient documentation

## 2018-04-10 DIAGNOSIS — Y929 Unspecified place or not applicable: Secondary | ICD-10-CM

## 2018-04-10 DIAGNOSIS — Z5321 Procedure and treatment not carried out due to patient leaving prior to being seen by health care provider: Secondary | ICD-10-CM

## 2018-04-10 DIAGNOSIS — Y939 Activity, unspecified: Secondary | ICD-10-CM | POA: Insufficient documentation

## 2018-04-10 DIAGNOSIS — R404 Transient alteration of awareness: Secondary | ICD-10-CM | POA: Diagnosis not present

## 2018-04-10 DIAGNOSIS — R55 Syncope and collapse: Secondary | ICD-10-CM | POA: Diagnosis not present

## 2018-04-10 DIAGNOSIS — M25551 Pain in right hip: Secondary | ICD-10-CM | POA: Insufficient documentation

## 2018-04-10 DIAGNOSIS — Y9389 Activity, other specified: Secondary | ICD-10-CM | POA: Insufficient documentation

## 2018-04-10 DIAGNOSIS — R531 Weakness: Secondary | ICD-10-CM

## 2018-04-10 DIAGNOSIS — R42 Dizziness and giddiness: Secondary | ICD-10-CM | POA: Insufficient documentation

## 2018-04-10 DIAGNOSIS — F17210 Nicotine dependence, cigarettes, uncomplicated: Secondary | ICD-10-CM | POA: Insufficient documentation

## 2018-04-10 DIAGNOSIS — Y92009 Unspecified place in unspecified non-institutional (private) residence as the place of occurrence of the external cause: Secondary | ICD-10-CM | POA: Insufficient documentation

## 2018-04-10 DIAGNOSIS — S79912A Unspecified injury of left hip, initial encounter: Secondary | ICD-10-CM | POA: Diagnosis not present

## 2018-04-10 DIAGNOSIS — S0990XA Unspecified injury of head, initial encounter: Secondary | ICD-10-CM | POA: Insufficient documentation

## 2018-04-10 DIAGNOSIS — I10 Essential (primary) hypertension: Secondary | ICD-10-CM | POA: Insufficient documentation

## 2018-04-10 DIAGNOSIS — W01190A Fall on same level from slipping, tripping and stumbling with subsequent striking against furniture, initial encounter: Secondary | ICD-10-CM | POA: Insufficient documentation

## 2018-04-10 DIAGNOSIS — F10929 Alcohol use, unspecified with intoxication, unspecified: Secondary | ICD-10-CM | POA: Insufficient documentation

## 2018-04-10 DIAGNOSIS — S0003XA Contusion of scalp, initial encounter: Secondary | ICD-10-CM | POA: Diagnosis not present

## 2018-04-10 DIAGNOSIS — Y998 Other external cause status: Secondary | ICD-10-CM | POA: Insufficient documentation

## 2018-04-10 DIAGNOSIS — F10129 Alcohol abuse with intoxication, unspecified: Secondary | ICD-10-CM | POA: Diagnosis not present

## 2018-04-10 DIAGNOSIS — Z23 Encounter for immunization: Secondary | ICD-10-CM | POA: Diagnosis not present

## 2018-04-10 DIAGNOSIS — W19XXXA Unspecified fall, initial encounter: Secondary | ICD-10-CM

## 2018-04-10 DIAGNOSIS — S51819A Laceration without foreign body of unspecified forearm, initial encounter: Secondary | ICD-10-CM | POA: Diagnosis not present

## 2018-04-10 DIAGNOSIS — S79911A Unspecified injury of right hip, initial encounter: Secondary | ICD-10-CM | POA: Diagnosis not present

## 2018-04-10 DIAGNOSIS — Y999 Unspecified external cause status: Secondary | ICD-10-CM | POA: Diagnosis not present

## 2018-04-10 DIAGNOSIS — F10120 Alcohol abuse with intoxication, uncomplicated: Secondary | ICD-10-CM | POA: Diagnosis not present

## 2018-04-10 DIAGNOSIS — S199XXA Unspecified injury of neck, initial encounter: Secondary | ICD-10-CM | POA: Diagnosis not present

## 2018-04-10 LAB — I-STAT CHEM 8, ED
BUN: 11 mg/dL (ref 6–20)
CALCIUM ION: 1.12 mmol/L — AB (ref 1.15–1.40)
Chloride: 111 mmol/L (ref 101–111)
Creatinine, Ser: 0.4 mg/dL — ABNORMAL LOW (ref 0.44–1.00)
GLUCOSE: 79 mg/dL (ref 65–99)
HCT: 34 % — ABNORMAL LOW (ref 36.0–46.0)
HEMOGLOBIN: 11.6 g/dL — AB (ref 12.0–15.0)
Potassium: 2.9 mmol/L — ABNORMAL LOW (ref 3.5–5.1)
Sodium: 143 mmol/L (ref 135–145)
TCO2: 21 mmol/L — AB (ref 22–32)

## 2018-04-10 MED ORDER — VITAMIN B-1 100 MG PO TABS
500.0000 mg | ORAL_TABLET | Freq: Once | ORAL | Status: AC
  Administered 2018-04-10: 500 mg via ORAL
  Filled 2018-04-10: qty 5

## 2018-04-10 MED ORDER — POTASSIUM CHLORIDE ER 20 MEQ PO TBCR: 10.0000 meq | EXTENDED_RELEASE_TABLET | Freq: Two times a day (BID) | ORAL | 0 refills | Status: DC

## 2018-04-10 MED ORDER — ADULT MULTIVITAMIN W/MINERALS CH
1.0000 | ORAL_TABLET | Freq: Once | ORAL | Status: AC
  Administered 2018-04-10: 1 via ORAL
  Filled 2018-04-10: qty 1

## 2018-04-10 MED ORDER — POTASSIUM CHLORIDE CRYS ER 20 MEQ PO TBCR
80.0000 meq | EXTENDED_RELEASE_TABLET | Freq: Once | ORAL | Status: AC
  Administered 2018-04-10: 80 meq via ORAL
  Filled 2018-04-10: qty 4

## 2018-04-10 MED ORDER — TETANUS-DIPHTH-ACELL PERTUSSIS 5-2.5-18.5 LF-MCG/0.5 IM SUSP
0.5000 mL | Freq: Once | INTRAMUSCULAR | Status: AC
  Administered 2018-04-10: 0.5 mL via INTRAMUSCULAR
  Filled 2018-04-10: qty 0.5

## 2018-04-10 MED ORDER — MULTI-VITAMIN/MINERALS PO TABS: 1.0000 | ORAL_TABLET | Freq: Every day | ORAL | 0 refills | Status: DC

## 2018-04-10 MED ORDER — VITAMIN B-1 100 MG PO TABS: 100.0000 mg | ORAL_TABLET | Freq: Every day | ORAL | 0 refills | Status: DC

## 2018-04-10 NOTE — ED Triage Notes (Signed)
Patient arrives by [facility] with complaints of fall/family states she "sips on brandy all day long". Hx pancreatitis. Daughter states patient rolled out of bed face down on floor-no obvious injuries. Patient has hx alcohol withdrawal and family states "ativan makes her angry".EMS states daughter and another female at home were also intoxicated. Patient has been complaining of burning on urination.

## 2018-04-10 NOTE — ED Provider Notes (Addendum)
[facility]-EMERGENCY DEPT Provider Note   CSN: [id number] Arrival date & time: 04/10/18  0253     History   Chief Complaint Chief Complaint  Patient presents with  . Fall  . Intoxicated    HPI April [name] is a 72 y.o. female.  The history is provided by the patient and the EMS personnel.  Fall  This is a new problem. The current episode started less than 1 hour ago. The problem occurs constantly. The problem has not changed since onset.Pertinent negatives include no chest pain, no abdominal pain, no headaches and no shortness of breath. Nothing aggravates the symptoms. Nothing relieves the symptoms. She has tried nothing for the symptoms. The treatment provided no relief.  Had been drinking brandy all day and rolled out of bed.  Denies HA, neck pain, abdominal pain, nausea vomiting and weakness.  No cough. No chest pain, no shortness of breath.    Past Medical History:  Diagnosis Date  . Anxiety   . Current smoker   . Depression   . ETOH abuse   . GERD (gastroesophageal reflux disease)   . History of shingles summer 2015  . HTN (hypertension)   . Hyperlipidemia   . Hypertension   . Leg edema   . Neuropathy   . Neuropathy     Patient Active Problem List   Diagnosis Date Noted  . Pancreatitis 10/05/2017  . Frequent falls 02/11/2017  . Protein-calorie malnutrition, severe 02/05/2017  . Gallstone pancreatitis 01/30/2017  . Hypokalemia 01/30/2017  . Macrocytic anemia 01/30/2017  . Metabolic acidosis 01/30/2017  . Neuropathy, alcoholic (HCC) 09/08/2016  . Neuropathy 12/11/2015  . Alcohol abuse 12/11/2015  . Tobacco abuse 12/11/2015  . Chronic venous insufficiency 09/13/2015  . Extremity numbness 09/13/2015    Past Surgical History:  Procedure Laterality Date  . ERCP N/A 02/01/2017   Procedure: ENDOSCOPIC RETROGRADE CHOLANGIOPANCREATOGRAPHY (ERCP);  Surgeon: Dorena Cookey, MD;  Location: [facility];  Service: Endoscopy;  Laterality: N/A;    . LAPAROSCOPIC CHOLECYSTECTOMY WITH COMMON DUCT EXPLORATION AND INTRAOP CHOLANGIOGRAM N/A 02/03/2017   Procedure: LAPAROSCOPIC CHOLECYSTECTOMY WITH POSSIBLE COMMON DUCT EXPLORATION AND INTRAOP CHOLANGIOGRAM;  Surgeon: Ovidio Kin, MD;  Location: [facility];  Service: General;  Laterality: N/A;  . removal of uterine fibroids  2004     OB History   None      Home Medications    Prior to Admission medications   Medication Sig Start Date End Date Taking? Authorizing Provider  amitriptyline (ELAVIL) 25 MG tablet Take 25 mg by mouth at bedtime as needed for sleep.  08/27/16  Yes [provider]  Aspirin-Acetaminophen-Caffeine (GOODY HEADACHE PO) Take 1 each by mouth every 8 (eight) hours as needed (pain).   Yes [provider]  clonazePAM (KLONOPIN) 0.5 MG tablet Take 0.5 mg by mouth 2 (two) times daily as needed for anxiety. as directed 08/05/17  Yes [provider]  gabapentin (NEURONTIN) 300 MG capsule Take 2 capsules (600 mg total) by mouth 3 (three) times daily. Office visit needed for further refills 12/16/17  Yes Anson Fret, MD  Multiple Vitamin (MULTIVITAMIN WITH MINERALS) TABS tablet Take 1 tablet by mouth daily. Patient not taking: Reported on 04/10/2018 04/13/17   Penny Pia, MD  vitamin B-12 1000 MCG tablet Take 1 tablet (1,000 mcg total) by mouth daily. Patient not taking: Reported on 04/10/2018 02/07/17   Tyrone Nine, MD    Family History Family History  Problem Relation Age of Onset  . Heart  disease Father   . Neuropathy Neg Hx     Social History Social History   Tobacco Use  . Smoking status: Current Every Day Smoker    Packs/day: 1.00    Types: Cigarettes  . Smokeless tobacco: Never Used  Substance Use Topics  . Alcohol use: Yes    Alcohol/week: 8.4 oz    Types: 14 Shots of liquor per week    Comment: states., "I keep a glass with me", reports drinking today (04/09/17)  . Drug use: No     Allergies   Naltrexone and  Penicillins   Review of Systems Review of Systems  Constitutional: Negative for diaphoresis.  Eyes: Negative for photophobia and visual disturbance.  Respiratory: Negative for cough, chest tightness, shortness of breath and wheezing.   Cardiovascular: Negative for chest pain, palpitations and leg swelling.  Gastrointestinal: Negative for abdominal pain, nausea and vomiting.  Genitourinary: Negative for dysuria and flank pain.  Musculoskeletal: Negative for back pain and neck pain.  Neurological: Negative for speech difficulty, weakness, numbness and headaches.  All other systems reviewed and are negative.    Physical Exam Updated Vital Signs BP 110/68 (BP Location: Left Arm)   Pulse 61   Temp 98 F (36.7 C) (Oral)   Resp 16   SpO2 100%   Physical Exam  Constitutional: She appears well-developed and well-nourished. No distress.  HENT:  Head: Normocephalic and atraumatic.  Right Ear: External ear normal.  Left Ear: External ear normal.  Nose: Nose normal.  Mouth/Throat: Oropharynx is clear and moist. No oropharyngeal exudate.  Eyes: Pupils are equal, round, and reactive to light. Conjunctivae are normal.  Neck: No tracheal deviation present.  c collar in place  Cardiovascular: Normal rate, regular rhythm, normal heart sounds and intact distal pulses.  Pulmonary/Chest: Effort normal and breath sounds normal. No stridor. She has no wheezes. She has no rales.  Abdominal: Soft. Bowel sounds are normal. She exhibits no distension and no mass. There is no tenderness. There is no rebound and no guarding.  Pelvis stable  Musculoskeletal: Normal range of motion. She exhibits no tenderness or deformity.       Right elbow: Normal.      Left elbow: Normal.       Right wrist: Normal.       Left wrist: Normal.       Right knee: Normal.       Left knee: Normal.       Right ankle: Normal. Achilles tendon normal.       Left ankle: Normal. Achilles tendon normal.       Cervical back:  Normal.       Thoracic back: Normal.       Lumbar back: Normal.       Right hand: Normal. Normal sensation noted. Normal strength noted.       Left hand: Normal. Normal sensation noted. Normal strength noted.       Right foot: Normal.       Left foot: Normal.  Neurological: She is alert. She displays normal reflexes. She exhibits normal muscle tone. Coordination normal.  Skin: Skin is warm and dry. Capillary refill takes less than 2 seconds.  Psychiatric: She has a normal mood and affect.     ED Treatments / Results  Labs (all labs ordered are listed, but only abnormal results are displayed) Labs Reviewed - No data to display   Radiology Ct Head Wo Contrast  Result Date: 04/10/2018 CLINICAL DATA:  Fall.  Intoxicated.  EXAM: CT HEAD WITHOUT CONTRAST CT CERVICAL SPINE WITHOUT CONTRAST TECHNIQUE: Multidetector CT imaging of the head and cervical spine was performed following the standard protocol without intravenous contrast. Multiplanar CT image reconstructions of the cervical spine were also generated. COMPARISON:  CT head 04/09/2017. CT head and cervical spine 02/11/2017. FINDINGS: CT HEAD FINDINGS Brain: Diffuse cerebral atrophy. No ventricular dilatation. Patchy low-attenuation changes in the deep white matter consistent with small vessel ischemia. No mass effect or midline shift. No abnormal extra-axial fluid collections. Gray-white matter junctions are distinct. Basal cisterns are not effaced. Vascular: Intracranial arterial vascular calcifications are present. Skull: Calvarium appears intact. No acute depressed skull fractures. Sinuses/Orbits: Mild mucosal thickening in the paranasal sinuses. No acute air-fluid levels. Mastoid air cells are clear. Other: Small subcutaneous scalp hematoma over the right parietal region. CT CERVICAL SPINE FINDINGS Alignment: Normal. Skull base and vertebrae: No acute fracture. No primary bone lesion or focal pathologic process. Soft tissues and spinal canal:  No prevertebral fluid or swelling. No visible canal hematoma. Disc levels: Degenerative changes in the cervical spine with narrowed interspaces and associated endplate hypertrophic changes, most prominent at C4-5, C5-6, and C6-7 levels. Degenerative changes in the cervical facet joints. Upper chest: Emphysematous changes in the lung apices. Right apical pleural thickening and calcification. Bilateral apical nodules, largest on the right measures 6.6 mm. No significant change since previous study. Other: Vascular calcifications. IMPRESSION: 1. No acute intracranial abnormalities. Chronic atrophy and small vessel ischemic changes. 2. Normal alignment of the cervical spine. Degenerative changes throughout. No acute displaced fractures identified. 3. Emphysematous changes in the lung apices. Right apical pleural thickening and calcification. Bilateral apical nodules, largest on the right measuring 6.6 mm. No significant change since prior study. Electronically Signed   By: Burman Nieves M.D.   On: 04/10/2018 03:55   Ct Cervical Spine Wo Contrast  Result Date: 04/10/2018 CLINICAL DATA:  Fall.  Intoxicated. EXAM: CT HEAD WITHOUT CONTRAST CT CERVICAL SPINE WITHOUT CONTRAST TECHNIQUE: Multidetector CT imaging of the head and cervical spine was performed following the standard protocol without intravenous contrast. Multiplanar CT image reconstructions of the cervical spine were also generated. COMPARISON:  CT head 04/09/2017. CT head and cervical spine 02/11/2017. FINDINGS: CT HEAD FINDINGS Brain: Diffuse cerebral atrophy. No ventricular dilatation. Patchy low-attenuation changes in the deep white matter consistent with small vessel ischemia. No mass effect or midline shift. No abnormal extra-axial fluid collections. Gray-white matter junctions are distinct. Basal cisterns are not effaced. Vascular: Intracranial arterial vascular calcifications are present. Skull: Calvarium appears intact. No acute depressed skull  fractures. Sinuses/Orbits: Mild mucosal thickening in the paranasal sinuses. No acute air-fluid levels. Mastoid air cells are clear. Other: Small subcutaneous scalp hematoma over the right parietal region. CT CERVICAL SPINE FINDINGS Alignment: Normal. Skull base and vertebrae: No acute fracture. No primary bone lesion or focal pathologic process. Soft tissues and spinal canal: No prevertebral fluid or swelling. No visible canal hematoma. Disc levels: Degenerative changes in the cervical spine with narrowed interspaces and associated endplate hypertrophic changes, most prominent at C4-5, C5-6, and C6-7 levels. Degenerative changes in the cervical facet joints. Upper chest: Emphysematous changes in the lung apices. Right apical pleural thickening and calcification. Bilateral apical nodules, largest on the right measures 6.6 mm. No significant change since previous study. Other: Vascular calcifications. IMPRESSION: 1. No acute intracranial abnormalities. Chronic atrophy and small vessel ischemic changes. 2. Normal alignment of the cervical spine. Degenerative changes throughout. No acute displaced fractures identified. 3. Emphysematous changes in the lung  apices. Right apical pleural thickening and calcification. Bilateral apical nodules, largest on the right measuring 6.6 mm. No significant change since prior study. Electronically Signed   By: Burman Nieves M.D.   On: 04/10/2018 03:55   Dg Hips Bilat W Or Wo Pelvis 3-4 Views  Result Date: 04/10/2018 CLINICAL DATA:  Bilateral hip pain after a fall. EXAM: DG HIP (WITH OR WITHOUT PELVIS) 3-4V BILAT COMPARISON:  None. FINDINGS: Pelvis and bilateral hips appear intact. No evidence of acute fracture or dislocation. No focal bone lesion or bone destruction. SI joints and symphysis pubis are not displaced. Degenerative changes present in the lower lumbar spine and in both hips. Vascular calcifications. Calcification in the pelvis may represent calcified uterine  fibroid. IMPRESSION: No evidence of acute fracture or dislocation in the pelvis or hips. Degenerative changes. Vascular calcifications. Electronically Signed   By: Burman Nieves M.D.   On: 04/10/2018 03:45    Procedures Procedures (including critical care time)  Medications Ordered in ED Medications  potassium chloride SA (K-DUR,KLOR-CON) CR tablet 80 mEq (has no administration in time range)  multivitamin with minerals tablet 1 tablet (has no administration in time range)  thiamine (VITAMIN B-1) tablet 500 mg (has no administration in time range)  Tdap (BOOSTRIX) injection 0.5 mL (0.5 mLs Intramuscular Given 04/10/18 0344)     Skin tear of forearm cleaned and dressed.  No sutures indicated.    Informed the daughter would like the patient tested for pancreatitis and other issues.  With Taquita present daughter states the patient cannot walk at all.  Daughter reports patient has a walker but does not like to use it.  Daughter would like additional testing done and to have her mother admitted.    Patient walked in the hallway without issue, no walker was used no assistance.  EDP and nurse witnessed same.    Daughter reports patient will not eat. Patient states she is very hungry and has been witnessed to eat applesauce and nutrigrain bar.    Final Clinical Impressions(s) / ED Diagnoses   The patient is denying all pain is alert and oriented in the ED. Based on mucus membranes and skin turgor there is no indication that the patient is dehydrated at this time. Patient PO challenged successfully in the ED.  I do believe this is a mechanical fall.  Exam and vitals are benign and reassuring.    There is no indication for admission at this time.  EDP placed a request for social work to get ancillary services for the patient.  I have explained this at length with nurse Taquita present as witness.  We will discharge the patient on potassium and multivitamin and thiamine.    I believe this was a  mechanical fall secondary to alcohol use as both patient and EMS reported.  There is no indicated for admission at this time.    Return for weakness, numbness, changes in vision or speech, fevers >100.4 unrelieved by medication, shortness of breath, intractable vomiting, or diarrhea, abdominal pain, Inability to tolerate liquids or food, cough, altered mental status or any concerns. No signs of systemic illness or infection. The patient is nontoxic-appearing on exam and vital signs are within normal limits.   I have reviewed the triage vital signs and the nursing notes. Pertinent labs &imaging results that were available during my care of the patient were reviewed by me and considered in my medical decision making (see chart for details).  After history, exam, and medical workup I  feel the patient has been appropriately medically screened and is safe for discharge home. Pertinent diagnoses were discussed with the patient. Patient was given return precautions.      Jasey Cortez, MD 04/10/18 [phone]

## 2018-04-10 NOTE — ED Notes (Signed)
Patient ambulated in hallway with no assist.

## 2018-04-10 NOTE — ED Notes (Signed)
Patient eating and drinking.

## 2018-04-10 NOTE — ED Triage Notes (Signed)
Per EMS pt from home.  Increasing weakness and dizziness in the past 2 days.  Reports poor po intake and poor urinary output x a few days. Fell hitting back of head tonight on a marble table. Bleeding controlled to contusion on the back of head.  Has moderate skin tear to right arm near elbow that is bandaged with bleeding controlled.  Complaints of headache and nausea.  Pt given 4 mg zofran IV en route as well as about 450 mL of NS.  VSS 115/72, HR 78, 95% on RA, CBG 89.

## 2018-04-10 NOTE — ED Notes (Signed)
Bed: WG95 Expected date:  Expected time:  Means of arrival:  Comments: EMS elderly female intoxicated/fall

## 2018-04-11 DIAGNOSIS — E876 Hypokalemia: Secondary | ICD-10-CM | POA: Diagnosis not present

## 2018-04-11 DIAGNOSIS — S0990XA Unspecified injury of head, initial encounter: Secondary | ICD-10-CM | POA: Diagnosis not present

## 2018-04-11 DIAGNOSIS — R55 Syncope and collapse: Secondary | ICD-10-CM | POA: Diagnosis not present

## 2018-04-11 DIAGNOSIS — R9431 Abnormal electrocardiogram [ECG] [EKG]: Secondary | ICD-10-CM | POA: Diagnosis not present

## 2018-04-11 DIAGNOSIS — E7800 Pure hypercholesterolemia, unspecified: Secondary | ICD-10-CM | POA: Diagnosis not present

## 2018-04-11 DIAGNOSIS — S40022A Contusion of left upper arm, initial encounter: Secondary | ICD-10-CM | POA: Diagnosis not present

## 2018-04-11 DIAGNOSIS — E785 Hyperlipidemia, unspecified: Secondary | ICD-10-CM | POA: Diagnosis not present

## 2018-04-11 DIAGNOSIS — I10 Essential (primary) hypertension: Secondary | ICD-10-CM | POA: Diagnosis not present

## 2018-04-11 DIAGNOSIS — R002 Palpitations: Secondary | ICD-10-CM | POA: Diagnosis not present

## 2018-04-11 DIAGNOSIS — I444 Left anterior fascicular block: Secondary | ICD-10-CM | POA: Diagnosis not present

## 2018-04-11 NOTE — ED Notes (Signed)
Pt's daughter states that the pt wants to be taken home. Encourage the visitor to let the patient stay and be seen, pt still decided to go.

## 2018-04-12 ENCOUNTER — Other Ambulatory Visit: Payer: Self-pay | Admitting: Physician Assistant

## 2018-04-12 ENCOUNTER — Telehealth: Payer: Self-pay

## 2018-04-12 DIAGNOSIS — R55 Syncope and collapse: Secondary | ICD-10-CM

## 2018-04-12 NOTE — Telephone Encounter (Signed)
Sent referral to scheduling 

## 2018-04-14 DIAGNOSIS — F1010 Alcohol abuse, uncomplicated: Secondary | ICD-10-CM | POA: Diagnosis not present

## 2018-04-14 DIAGNOSIS — R634 Abnormal weight loss: Secondary | ICD-10-CM | POA: Diagnosis not present

## 2018-04-14 DIAGNOSIS — R627 Adult failure to thrive: Secondary | ICD-10-CM | POA: Diagnosis not present

## 2018-04-14 DIAGNOSIS — S50812D Abrasion of left forearm, subsequent encounter: Secondary | ICD-10-CM | POA: Diagnosis not present

## 2018-04-15 ENCOUNTER — Other Ambulatory Visit: Payer: PPO

## 2018-04-19 DIAGNOSIS — S50812D Abrasion of left forearm, subsequent encounter: Secondary | ICD-10-CM | POA: Diagnosis not present

## 2018-04-19 DIAGNOSIS — R627 Adult failure to thrive: Secondary | ICD-10-CM | POA: Diagnosis not present

## 2018-04-19 DIAGNOSIS — K861 Other chronic pancreatitis: Secondary | ICD-10-CM | POA: Diagnosis not present

## 2018-04-19 DIAGNOSIS — R634 Abnormal weight loss: Secondary | ICD-10-CM | POA: Diagnosis not present

## 2018-04-23 ENCOUNTER — Encounter (HOSPITAL_COMMUNITY): Payer: Self-pay | Admitting: *Deleted

## 2018-04-23 ENCOUNTER — Emergency Department (HOSPITAL_COMMUNITY): Payer: PPO

## 2018-04-23 ENCOUNTER — Emergency Department (HOSPITAL_COMMUNITY)
Admission: EM | Admit: 2018-04-23 | Discharge: 2018-04-23 | Disposition: A | Discharge status: Home / Self Care | Payer: PPO | Attending: Emergency Medicine | Admitting: Emergency Medicine

## 2018-04-23 ENCOUNTER — Other Ambulatory Visit: Payer: Self-pay

## 2018-04-23 DIAGNOSIS — F1010 Alcohol abuse, uncomplicated: Secondary | ICD-10-CM | POA: Diagnosis not present

## 2018-04-23 DIAGNOSIS — R531 Weakness: Secondary | ICD-10-CM | POA: Insufficient documentation

## 2018-04-23 DIAGNOSIS — R55 Syncope and collapse: Secondary | ICD-10-CM | POA: Diagnosis not present

## 2018-04-23 DIAGNOSIS — E785 Hyperlipidemia, unspecified: Secondary | ICD-10-CM | POA: Insufficient documentation

## 2018-04-23 DIAGNOSIS — F17210 Nicotine dependence, cigarettes, uncomplicated: Secondary | ICD-10-CM | POA: Insufficient documentation

## 2018-04-23 DIAGNOSIS — Z79899 Other long term (current) drug therapy: Secondary | ICD-10-CM | POA: Diagnosis not present

## 2018-04-23 DIAGNOSIS — I10 Essential (primary) hypertension: Secondary | ICD-10-CM | POA: Diagnosis not present

## 2018-04-23 DIAGNOSIS — R51 Headache: Secondary | ICD-10-CM | POA: Diagnosis not present

## 2018-04-23 DIAGNOSIS — R404 Transient alteration of awareness: Secondary | ICD-10-CM | POA: Diagnosis not present

## 2018-04-23 HISTORY — DX: Acute pancreatitis without necrosis or infection, unspecified: K85.90

## 2018-04-23 LAB — URINALYSIS, ROUTINE W REFLEX MICROSCOPIC
Bilirubin Urine: NEGATIVE
Glucose, UA: NEGATIVE mg/dL
Hgb urine dipstick: NEGATIVE
KETONES UR: NEGATIVE mg/dL
Leukocytes, UA: NEGATIVE
Nitrite: NEGATIVE
PH: 5 (ref 5.0–8.0)
Protein, ur: NEGATIVE mg/dL
Specific Gravity, Urine: 1.004 — ABNORMAL LOW (ref 1.005–1.030)

## 2018-04-23 LAB — COMPREHENSIVE METABOLIC PANEL
ALT: 17 U/L (ref 14–54)
AST: 48 U/L — ABNORMAL HIGH (ref 15–41)
Albumin: 2.8 g/dL — ABNORMAL LOW (ref 3.5–5.0)
Alkaline Phosphatase: 160 U/L — ABNORMAL HIGH (ref 38–126)
Anion gap: 10 (ref 5–15)
BUN: 26 mg/dL — ABNORMAL HIGH (ref 6–20)
CALCIUM: 8.9 mg/dL (ref 8.9–10.3)
CO2: 20 mmol/L — ABNORMAL LOW (ref 22–32)
CREATININE: 0.57 mg/dL (ref 0.44–1.00)
Chloride: 109 mmol/L (ref 101–111)
Glucose, Bld: 74 mg/dL (ref 65–99)
Potassium: 4.3 mmol/L (ref 3.5–5.1)
Sodium: 139 mmol/L (ref 135–145)
Total Bilirubin: 0.4 mg/dL (ref 0.3–1.2)
Total Protein: 6.4 g/dL — ABNORMAL LOW (ref 6.5–8.1)

## 2018-04-23 LAB — CBC
HCT: 38.2 % (ref 36.0–46.0)
Hemoglobin: 12.3 g/dL (ref 12.0–15.0)
MCH: 33.2 pg (ref 26.0–34.0)
MCHC: 32.2 g/dL (ref 30.0–36.0)
MCV: 103 fL — ABNORMAL HIGH (ref 78.0–100.0)
PLATELETS: 438 10*3/uL — AB (ref 150–400)
RBC: 3.71 MIL/uL — AB (ref 3.87–5.11)
RDW: 17.6 % — ABNORMAL HIGH (ref 11.5–15.5)
WBC: 7.2 10*3/uL (ref 4.0–10.5)

## 2018-04-23 LAB — CBG MONITORING, ED
GLUCOSE-CAPILLARY: 95 mg/dL (ref 65–99)
Glucose-Capillary: 55 mg/dL — ABNORMAL LOW (ref 65–99)

## 2018-04-23 LAB — AMMONIA: Ammonia: 19 umol/L (ref 9–35)

## 2018-04-23 LAB — LIPASE, BLOOD: Lipase: 39 U/L (ref 11–51)

## 2018-04-23 LAB — ETHANOL: Alcohol, Ethyl (B): 78 mg/dL — ABNORMAL HIGH (ref <10)

## 2018-04-23 MED ORDER — SODIUM CHLORIDE 0.9 % IV BOLUS
1000.0000 mL | Freq: Once | INTRAVENOUS | Status: AC
  Administered 2018-04-23: 1000 mL via INTRAVENOUS

## 2018-04-23 MED ORDER — LORAZEPAM 1 MG PO TABS: 0.0000 mg | ORAL_TABLET | Freq: Two times a day (BID) | ORAL | Status: DC

## 2018-04-23 MED ORDER — LORAZEPAM 2 MG/ML IJ SOLN: 0.0000 mg | Freq: Two times a day (BID) | INTRAMUSCULAR | Status: DC

## 2018-04-23 MED ORDER — THIAMINE HCL 100 MG/ML IJ SOLN: 100.0000 mg | Freq: Every day | INTRAMUSCULAR | Status: DC

## 2018-04-23 MED ORDER — VITAMIN B-1 100 MG PO TABS
100.0000 mg | ORAL_TABLET | Freq: Every day | ORAL | Status: DC
  Administered 2018-04-23: 100 mg via ORAL
  Filled 2018-04-23: qty 1

## 2018-04-23 MED ORDER — LORAZEPAM 2 MG/ML IJ SOLN: 0.0000 mg | Freq: Four times a day (QID) | INTRAMUSCULAR | Status: DC

## 2018-04-23 MED ORDER — LORAZEPAM 1 MG PO TABS
0.0000 mg | ORAL_TABLET | Freq: Four times a day (QID) | ORAL | Status: DC
  Administered 2018-04-23: 1 mg via ORAL
  Filled 2018-04-23: qty 1

## 2018-04-23 NOTE — ED Notes (Signed)
Bed: [facility]08 Expected date: 04/23/18 Expected time: 9:31 AM Means of arrival: Ambulance Comments: Evaluation ? Orthostatic

## 2018-04-23 NOTE — ED Provider Notes (Signed)
[facility]-EMERGENCY DEPT Provider Note   CSN: [id number] Arrival date & time: 04/23/18  0932     History   Chief Complaint Chief Complaint  Patient presents with  . Weakness    HPI April [name] is a 72 y.o. female with a past medical history of alcohol abuse, hypertension, hyperlipidemia, gallstone pancreatitis, who presents to ED for evaluation of generalized weakness for the past week.  Patient suffered from a fall April 28 and was seen and evaluated in the ED afterwards.  She did have head injury and syncope at that time.  Since then, she states that she has had progressive weakness, decreased p.o. intake, decreased urination.  She does report some vague generalized abdominal pain.  She also suffered from a fall 6 days ago.  At that time she does report a head injury and loss of consciousness.  She also reports headache in bilateral blurry vision.  Also reports SOB with walking. Daughter also states that she has been "confused" and "losing my train of thought."  Patient has been unable to keep much p.o. intake secondary to nausea.  Denies any vomiting.  She denies any changes in bowel movements.  Patient reports daily alcohol use for the past several years.  She reports drinking "2 cups of whiskey or brandy" daily.  Last drink was last night.  She also reports 1 pack/day tobacco use.  Denies any other drug use. Dr. is requesting pancreas enzymes, ammonia level, giving IV fluids and banana bag.  HPI  Past Medical History:  Diagnosis Date  . Anxiety   . Current smoker   . Depression   . ETOH abuse   . GERD (gastroesophageal reflux disease)   . History of shingles summer 2015  . HTN (hypertension)   . Hyperlipidemia   . Hypertension   . Leg edema   . Neuropathy   . Neuropathy   . Pancreatitis     Patient Active Problem List   Diagnosis Date Noted  . Pancreatitis 10/05/2017  . Frequent falls 02/11/2017  . Protein-calorie malnutrition, severe  02/05/2017  . Gallstone pancreatitis 01/30/2017  . Hypokalemia 01/30/2017  . Macrocytic anemia 01/30/2017  . Metabolic acidosis 01/30/2017  . Neuropathy, alcoholic (HCC) 09/08/2016  . Neuropathy 12/11/2015  . Alcohol abuse 12/11/2015  . Tobacco abuse 12/11/2015  . Chronic venous insufficiency 09/13/2015  . Extremity numbness 09/13/2015    Past Surgical History:  Procedure Laterality Date  . ERCP N/A 02/01/2017   Procedure: ENDOSCOPIC RETROGRADE CHOLANGIOPANCREATOGRAPHY (ERCP);  Surgeon: Dorena Cookey, MD;  Location: [facility];  Service: Endoscopy;  Laterality: N/A;  . LAPAROSCOPIC CHOLECYSTECTOMY WITH COMMON DUCT EXPLORATION AND INTRAOP CHOLANGIOGRAM N/A 02/03/2017   Procedure: LAPAROSCOPIC CHOLECYSTECTOMY WITH POSSIBLE COMMON DUCT EXPLORATION AND INTRAOP CHOLANGIOGRAM;  Surgeon: Ovidio Kin, MD;  Location: [facility];  Service: General;  Laterality: N/A;  . removal of uterine fibroids  2004     OB History   None      Home Medications    Prior to Admission medications   Medication Sig Start Date End Date Taking? Authorizing Provider  amitriptyline (ELAVIL) 25 MG tablet Take 25 mg by mouth at bedtime as needed for sleep.  08/27/16  Yes [provider]  Aspirin-Acetaminophen-Caffeine (GOODY HEADACHE PO) Take 1 each by mouth every 8 (eight) hours as needed (pain).   Yes [provider]  clonazePAM (KLONOPIN) 0.5 MG tablet Take 0.5 mg by mouth 2 (two) times daily as needed for anxiety. as directed 08/05/17  Yes [provider]  doxycycline (VIBRAMYCIN) 100 MG capsule Take 100 mg by mouth 2 (two) times daily. 04/19/18  Yes [provider]  mirtazapine (REMERON) 7.5 MG tablet Take 7.5 mg by mouth at bedtime. 04/14/18  Yes [provider]  ondansetron (ZOFRAN) 4 MG tablet Take 4 mg by mouth every 8 (eight) hours as needed for nausea.  04/21/18  Yes [provider]  potassium chloride 20 MEQ TBCR Take 10 mEq by mouth 2 (two) times daily. Patient  taking differently: Take 20 mEq by mouth 2 (two) times daily.  04/10/18  Yes Palumbo, April, MD  gabapentin (NEURONTIN) 300 MG capsule Take 2 capsules (600 mg total) by mouth 3 (three) times daily. Office visit needed for further refills Patient not taking: Reported on 04/23/2018 12/16/17   Anson Fret, MD  Multiple Vitamin (MULTIVITAMIN WITH MINERALS) TABS tablet Take 1 tablet by mouth daily. Patient not taking: Reported on 04/10/2018 04/13/17   Penny Pia, MD  Multiple Vitamins-Minerals (MULTIVITAMIN WITH MINERALS) tablet Take 1 tablet by mouth daily. Patient not taking: Reported on 04/23/2018 04/10/18   Palumbo, April, MD  thiamine (VITAMIN B-1) 100 MG tablet Take 1 tablet (100 mg total) by mouth daily. Patient not taking: Reported on 04/23/2018 04/10/18   Palumbo, April, MD  vitamin B-12 1000 MCG tablet Take 1 tablet (1,000 mcg total) by mouth daily. Patient not taking: Reported on 04/10/2018 02/07/17   Tyrone Nine, MD    Family History Family History  Problem Relation Age of Onset  . Heart disease Father   . Neuropathy Neg Hx     Social History Social History   Tobacco Use  . Smoking status: Current Every Day Smoker    Packs/day: 1.00    Types: Cigarettes  . Smokeless tobacco: Never Used  Substance Use Topics  . Alcohol use: Yes    Alcohol/week: 8.4 oz    Types: 14 Shots of liquor per week    Comment: states., "I keep a glass with me", reports drinking today (04/09/17)  . Drug use: No     Allergies   Naltrexone and Penicillins   Review of Systems Review of Systems  Constitutional: Positive for activity change, appetite change and fatigue. Negative for chills and fever.  HENT: Negative for ear pain, rhinorrhea, sneezing and sore throat.   Eyes: Positive for visual disturbance. Negative for photophobia.  Respiratory: Positive for cough. Negative for chest tightness, shortness of breath and wheezing.   Cardiovascular: Negative for chest pain and palpitations.    Gastrointestinal: Positive for abdominal pain and nausea. Negative for blood in stool, constipation, diarrhea and vomiting.  Genitourinary: Negative for dysuria, hematuria and urgency.  Musculoskeletal: Negative for myalgias.  Skin: Negative for rash.  Neurological: Positive for syncope, weakness and headaches. Negative for dizziness and light-headedness.     Physical Exam Updated Vital Signs BP 111/82   Pulse 64   Temp 98.1 F (36.7 C) (Oral)   Resp 15   SpO2 99%   Physical Exam  Constitutional: She is oriented to person, place, and time. She appears well-developed and well-nourished. No distress.  Chronically ill- appearing. Cachectic. NAD.  HENT:  Head: Normocephalic and atraumatic.  Nose: Nose normal.  Eyes: Pupils are equal, round, and reactive to light. Conjunctivae and EOM are normal. Right eye exhibits no discharge. Left eye exhibits no discharge. No scleral icterus.  Neck: Normal range of motion. Neck supple.  Cardiovascular: Normal rate, regular rhythm, normal heart sounds and intact distal pulses. Exam reveals no  gallop and no friction rub.  No murmur heard. Pulmonary/Chest: Effort normal and breath sounds normal. No respiratory distress.  Abdominal: Soft. Bowel sounds are normal. She exhibits no distension. There is no tenderness. There is no guarding.  Musculoskeletal: Normal range of motion. She exhibits no edema.  No midline spinal tenderness present in lumbar, thoracic or cervical spine. No step-off palpated. No visible bruising, edema or temperature change noted. No objective signs of numbness present. No saddle anesthesia.   Neurological: She is alert and oriented to person, place, and time. No cranial nerve deficit or sensory deficit. She exhibits normal muscle tone. Coordination normal.  Alert and oriented to self, place, time, situation. Pupils reactive. No facial asymmetry noted. Cranial nerves appear grossly intact. Sensation intact to light touch on face, BUE  and BLE.  Skin: Skin is warm and dry. No rash noted.  Psychiatric: She has a normal mood and affect.  Nursing note and vitals reviewed.    ED Treatments / Results  Labs (all labs ordered are listed, but only abnormal results are displayed) Labs Reviewed  CBC - Abnormal; Notable for the following components:      Result Value   RBC 3.71 (*)    MCV 103.0 (*)    RDW 17.6 (*)    Platelets 438 (*)    All other components within normal limits  URINALYSIS, ROUTINE W REFLEX MICROSCOPIC - Abnormal; Notable for the following components:   Color, Urine YELLOW (*)    Specific Gravity, Urine 1.004 (*)    Bacteria, UA RARE (*)    All other components within normal limits  ETHANOL - Abnormal; Notable for the following components:   Alcohol, Ethyl (B) 78 (*)    All other components within normal limits  COMPREHENSIVE METABOLIC PANEL - Abnormal; Notable for the following components:   CO2 20 (*)    BUN 26 (*)    Total Protein 6.4 (*)    Albumin 2.8 (*)    AST 48 (*)    Alkaline Phosphatase 160 (*)    All other components within normal limits  CBG MONITORING, ED - Abnormal; Notable for the following components:   Glucose-Capillary 55 (*)    All other components within normal limits  LIPASE, BLOOD  AMMONIA  I-STAT TROPONIN, ED  CBG MONITORING, ED  CBG MONITORING, ED    EKG None  Radiology Dg Chest 2 View  Result Date: 04/23/2018 CLINICAL DATA:  Generalized weakness. EXAM: CHEST - 2 VIEW COMPARISON:  February 11, 2017 FINDINGS: The heart size and mediastinal contours are within normal limits. Both lungs are clear. The visualized skeletal structures are unremarkable. IMPRESSION: No active cardiopulmonary disease. Electronically Signed   By: Sherian Rein M.D.   On: 04/23/2018 10:45   Ct Head Wo Contrast  Result Date: 04/23/2018 CLINICAL DATA:  Generalized weakness with decreased p.o. intake and decreased urine output. EXAM: CT HEAD WITHOUT CONTRAST TECHNIQUE: Contiguous axial images were  obtained from the base of the skull through the vertex without intravenous contrast. COMPARISON:  April 10, 2018 FINDINGS: Brain: No evidence of acute infarction, hemorrhage, hydrocephalus, extra-axial collection or mass lesion/mass effect. Chronic diffuse atrophy is identified. Chronic bilateral periventricular white matter small vessel ischemic changes noted. Vascular: No hyperdense vessel or unexpected calcification. Skull: Normal. Negative for fracture or focal lesion. Sinuses/Orbits: No acute finding. Other: None. IMPRESSION: No focal acute intracranial abnormality identified. Chronic diffuse atrophy and chronic bilateral periventricular white matter small vessel ischemic change. Electronically Signed   By: Scherry Ran  Juel Burrow M.D.   On: 04/23/2018 10:34    Procedures Procedures (including critical care time)  Medications Ordered in ED Medications  LORazepam (ATIVAN) injection 0-4 mg ( Intravenous See Alternative 04/23/18 1039)    Or  LORazepam (ATIVAN) tablet 0-4 mg (1 mg Oral Given 04/23/18 1039)  LORazepam (ATIVAN) injection 0-4 mg (has no administration in time range)    Or  LORazepam (ATIVAN) tablet 0-4 mg (has no administration in time range)  thiamine (VITAMIN B-1) tablet 100 mg (100 mg Oral Given 04/23/18 1039)    Or  thiamine (B-1) injection 100 mg ( Intravenous See Alternative 04/23/18 1039)  sodium chloride 0.9 % bolus 1,000 mL (0 mLs Intravenous Stopped 04/23/18 1220)     Initial Impression / Assessment and Plan / ED Course  I have reviewed the triage vital signs and the nursing notes.  Pertinent labs & imaging results that were available during my care of the patient were reviewed by me and considered in my medical decision making (see chart for details).     Patient, with a past medical history of alcohol abuse, hypertension, hyperlipidemia, who presents to ED for multiple complaints.  She reports generalized weakness, recurrent falls, headache, vision changes, decreased p.o.  intake, decreased urine, "confusion", nausea.  Symptoms have been going on since April 28 when she had a fall.  At that time she was evaluated in the ED with negative work-up.  Patient continues to use alcohol daily, including "2 cups of whiskey or brandy."  Patient was sent here for lab work and imaging.  Provider also requests a banana bag and fluid.  Physical exam she is overall well-appearing.  She does appear chronically ill and cachectic but does not appear dehydrated.  No deficits on neurological examination noted.  No signs of spinal trauma.  CBC, CMP, lipase unremarkable.  Ethanol elevated at 76.  Last drink was yesterday.  Troponin negative.  Ammonia unremarkable.  Initial CBG was 55 but improved to 90s after food was given.  EKG with no ischemic changes.  Chest x-ray unremarkable.  Head CT unremarkable.  Patient's extensive lab work and imaging today is reassuring and unremarkable.  Vital signs are within normal limits.  Patient reports improvement in her symptoms with fluids given here in the ED.  She is tolerating p.o. intake without difficulty.  Patient states that she is comfortable with discharge home and following up with her PCP. In fact, she wants to leave. Advised to return to ED for any severe worsening symptoms. Patient discussed with and seen by my attending, Dr. Rubin Payor.  Portions of this note were generated with Scientist, clinical (histocompatibility and immunogenetics). Dictation errors may occur despite best attempts at proofreading.   Final Clinical Impressions(s) / ED Diagnoses   Final diagnoses:  Alcohol abuse  Generalized weakness    ED Discharge Orders    None       Dietrich Pates, PA-C 04/23/18 1426    Benjiman Core, MD 04/23/18 1544

## 2018-04-23 NOTE — ED Triage Notes (Signed)
EMS reports pt has generalized weakness with decreased po intake and urine output, fell in April and was treated for head hematoma. A & O x4.

## 2018-04-25 LAB — I-STAT TROPONIN, ED: TROPONIN I, POC: 0 ng/mL (ref 0.00–0.08)

## 2018-04-30 ENCOUNTER — Emergency Department (HOSPITAL_COMMUNITY): Payer: PPO

## 2018-04-30 ENCOUNTER — Emergency Department (HOSPITAL_COMMUNITY)
Admission: EM | Admit: 2018-04-30 | Discharge: 2018-04-30 | Disposition: A | Discharge status: Home / Self Care | Payer: PPO | Attending: Emergency Medicine | Admitting: Emergency Medicine

## 2018-04-30 ENCOUNTER — Encounter (HOSPITAL_COMMUNITY): Payer: Self-pay | Admitting: *Deleted

## 2018-04-30 ENCOUNTER — Other Ambulatory Visit: Payer: Self-pay

## 2018-04-30 DIAGNOSIS — R531 Weakness: Secondary | ICD-10-CM | POA: Diagnosis not present

## 2018-04-30 DIAGNOSIS — E876 Hypokalemia: Secondary | ICD-10-CM | POA: Insufficient documentation

## 2018-04-30 DIAGNOSIS — R51 Headache: Secondary | ICD-10-CM | POA: Diagnosis not present

## 2018-04-30 DIAGNOSIS — F17210 Nicotine dependence, cigarettes, uncomplicated: Secondary | ICD-10-CM | POA: Insufficient documentation

## 2018-04-30 DIAGNOSIS — R7989 Other specified abnormal findings of blood chemistry: Secondary | ICD-10-CM | POA: Diagnosis not present

## 2018-04-30 DIAGNOSIS — Z79899 Other long term (current) drug therapy: Secondary | ICD-10-CM | POA: Insufficient documentation

## 2018-04-30 DIAGNOSIS — I10 Essential (primary) hypertension: Secondary | ICD-10-CM | POA: Diagnosis not present

## 2018-04-30 DIAGNOSIS — R0602 Shortness of breath: Secondary | ICD-10-CM | POA: Insufficient documentation

## 2018-04-30 DIAGNOSIS — E860 Dehydration: Secondary | ICD-10-CM | POA: Diagnosis not present

## 2018-04-30 DIAGNOSIS — R42 Dizziness and giddiness: Secondary | ICD-10-CM | POA: Insufficient documentation

## 2018-04-30 DIAGNOSIS — R404 Transient alteration of awareness: Secondary | ICD-10-CM | POA: Diagnosis not present

## 2018-04-30 LAB — COMPREHENSIVE METABOLIC PANEL
ALK PHOS: 160 U/L — AB (ref 38–126)
ALT: 21 U/L (ref 14–54)
ANION GAP: 11 (ref 5–15)
AST: 44 U/L — ABNORMAL HIGH (ref 15–41)
Albumin: 2.4 g/dL — ABNORMAL LOW (ref 3.5–5.0)
BILIRUBIN TOTAL: 0.4 mg/dL (ref 0.3–1.2)
BUN: 26 mg/dL — ABNORMAL HIGH (ref 6–20)
CALCIUM: 8.3 mg/dL — AB (ref 8.9–10.3)
CO2: 21 mmol/L — ABNORMAL LOW (ref 22–32)
Chloride: 108 mmol/L (ref 101–111)
Creatinine, Ser: 0.47 mg/dL (ref 0.44–1.00)
GLUCOSE: 76 mg/dL (ref 65–99)
Potassium: 3.1 mmol/L — ABNORMAL LOW (ref 3.5–5.1)
Sodium: 140 mmol/L (ref 135–145)
TOTAL PROTEIN: 5.4 g/dL — AB (ref 6.5–8.1)

## 2018-04-30 LAB — CBC WITH DIFFERENTIAL/PLATELET
BASOS PCT: 0 %
Basophils Absolute: 0 10*3/uL (ref 0.0–0.1)
EOS ABS: 0.1 10*3/uL (ref 0.0–0.7)
EOS PCT: 1 %
HCT: 36.2 % (ref 36.0–46.0)
Hemoglobin: 11.7 g/dL — ABNORMAL LOW (ref 12.0–15.0)
LYMPHS ABS: 2.1 10*3/uL (ref 0.7–4.0)
Lymphocytes Relative: 30 %
MCH: 33.1 pg (ref 26.0–34.0)
MCHC: 32.3 g/dL (ref 30.0–36.0)
MCV: 102.3 fL — ABNORMAL HIGH (ref 78.0–100.0)
Monocytes Absolute: 0.7 10*3/uL (ref 0.1–1.0)
Monocytes Relative: 10 %
Neutro Abs: 4.1 10*3/uL (ref 1.7–7.7)
Neutrophils Relative %: 59 %
PLATELETS: 316 10*3/uL (ref 150–400)
RBC: 3.54 MIL/uL — AB (ref 3.87–5.11)
RDW: 17 % — ABNORMAL HIGH (ref 11.5–15.5)
WBC: 7 10*3/uL (ref 4.0–10.5)

## 2018-04-30 LAB — URINALYSIS, ROUTINE W REFLEX MICROSCOPIC
Bilirubin Urine: NEGATIVE
GLUCOSE, UA: NEGATIVE mg/dL
HGB URINE DIPSTICK: NEGATIVE
KETONES UR: 20 mg/dL — AB
LEUKOCYTES UA: NEGATIVE
Nitrite: NEGATIVE
PROTEIN: NEGATIVE mg/dL
Specific Gravity, Urine: 1.033 — ABNORMAL HIGH (ref 1.005–1.030)
pH: 5 (ref 5.0–8.0)

## 2018-04-30 LAB — D-DIMER, QUANTITATIVE (NOT AT ARMC): D DIMER QUANT: 1.29 ug{FEU}/mL — AB (ref 0.00–0.50)

## 2018-04-30 LAB — I-STAT TROPONIN, ED: Troponin i, poc: 0 ng/mL (ref 0.00–0.08)

## 2018-04-30 LAB — ETHANOL

## 2018-04-30 LAB — LIPASE, BLOOD: LIPASE: 43 U/L (ref 11–51)

## 2018-04-30 MED ORDER — SODIUM CHLORIDE 0.9 % IV BOLUS
1000.0000 mL | Freq: Once | INTRAVENOUS | Status: AC
  Administered 2018-04-30: 1000 mL via INTRAVENOUS

## 2018-04-30 MED ORDER — IOPAMIDOL (ISOVUE-370) INJECTION 76%
INTRAVENOUS | Status: AC
  Filled 2018-04-30: qty 100

## 2018-04-30 MED ORDER — POTASSIUM CHLORIDE 20 MEQ/15ML (10%) PO SOLN
40.0000 meq | Freq: Once | ORAL | Status: AC
  Administered 2018-04-30: 40 meq via ORAL
  Filled 2018-04-30: qty 30

## 2018-04-30 MED ORDER — LACTATED RINGERS IV BOLUS
1000.0000 mL | Freq: Once | INTRAVENOUS | Status: AC
  Administered 2018-04-30: 1000 mL via INTRAVENOUS

## 2018-04-30 MED ORDER — THIAMINE HCL 100 MG/ML IJ SOLN
100.0000 mg | Freq: Once | INTRAMUSCULAR | Status: AC
  Administered 2018-04-30: 100 mg via INTRAVENOUS
  Filled 2018-04-30: qty 2

## 2018-04-30 MED ORDER — POTASSIUM CHLORIDE CRYS ER 20 MEQ PO TBCR
40.0000 meq | EXTENDED_RELEASE_TABLET | Freq: Once | ORAL | Status: DC
  Filled 2018-04-30: qty 2

## 2018-04-30 MED ORDER — VITAMIN B-1 100 MG PO TABS
100.0000 mg | ORAL_TABLET | Freq: Every day | ORAL | Status: DC
  Filled 2018-04-30: qty 1

## 2018-04-30 MED ORDER — IOPAMIDOL (ISOVUE-370) INJECTION 76%
100.0000 mL | Freq: Once | INTRAVENOUS | Status: AC | PRN
  Administered 2018-04-30: 90 mL via INTRAVENOUS

## 2018-04-30 NOTE — ED Triage Notes (Signed)
EMS states pt has increased weakness that comes and goes, seen here on the 11 th for same, felt better far a couple of days and now weaker again.

## 2018-04-30 NOTE — ED Provider Notes (Signed)
[facility]-EMERGENCY DEPT Provider Note   CSN: [id number] Arrival date & time: 04/30/18  1628     History   Chief Complaint Chief Complaint  Patient presents with  . Weakness    HPI April [name] is a 72 y.o. female.  HPI  72 year old female with a history of alcohol abuse presents with weakness and lightheadedness.  She states that she is been feeling this way for a couple weeks since her last ED visit from a fall.  She is been having a headache since last ED visit this started at that time and is frontal on the right side.  It has been constant.  She rates it as severe.  She drinks about 3 shot glasses of brandy per day but states she drinks very little water and does not eat much.  She feels diffusely weak but no unilateral weakness.  She feels lightheaded, mostly with standing and walking.  She gets short of breath when she walks but denies any chest pain.  She denies any abdominal pain or vomiting.  No diarrhea/constipation or urinary symptoms. No back pain or incontinence. No room spinning sensation, just lightheadedness.  Past Medical History:  Diagnosis Date  . Anxiety   . Current smoker   . Depression   . ETOH abuse   . GERD (gastroesophageal reflux disease)   . History of shingles summer 2015  . HTN (hypertension)   . Hyperlipidemia   . Hypertension   . Leg edema   . Neuropathy   . Neuropathy   . Pancreatitis     Patient Active Problem List   Diagnosis Date Noted  . Pancreatitis 10/05/2017  . Frequent falls 02/11/2017  . Protein-calorie malnutrition, severe 02/05/2017  . Gallstone pancreatitis 01/30/2017  . Hypokalemia 01/30/2017  . Macrocytic anemia 01/30/2017  . Metabolic acidosis 01/30/2017  . Neuropathy, alcoholic (HCC) 09/08/2016  . Neuropathy 12/11/2015  . Alcohol abuse 12/11/2015  . Tobacco abuse 12/11/2015  . Chronic venous insufficiency 09/13/2015  . Extremity numbness 09/13/2015    Past Surgical History:  Procedure  Laterality Date  . ERCP N/A 02/01/2017   Procedure: ENDOSCOPIC RETROGRADE CHOLANGIOPANCREATOGRAPHY (ERCP);  Surgeon: Dorena Cookey, MD;  Location: [facility];  Service: Endoscopy;  Laterality: N/A;  . LAPAROSCOPIC CHOLECYSTECTOMY WITH COMMON DUCT EXPLORATION AND INTRAOP CHOLANGIOGRAM N/A 02/03/2017   Procedure: LAPAROSCOPIC CHOLECYSTECTOMY WITH POSSIBLE COMMON DUCT EXPLORATION AND INTRAOP CHOLANGIOGRAM;  Surgeon: Ovidio Kin, MD;  Location: [facility];  Service: General;  Laterality: N/A;  . removal of uterine fibroids  2004     OB History   None      Home Medications    Prior to Admission medications   Medication Sig Start Date End Date Taking? Authorizing Provider  amitriptyline (ELAVIL) 25 MG tablet Take 25 mg by mouth at bedtime as needed for sleep.  08/27/16  Yes [provider]  Aspirin-Acetaminophen-Caffeine (GOODY HEADACHE PO) Take 1 each by mouth every 8 (eight) hours as needed (pain).   Yes [provider]  clonazePAM (KLONOPIN) 0.5 MG tablet Take 0.5 mg by mouth 2 (two) times daily as needed for anxiety. as directed 08/05/17  Yes [provider]  mirtazapine (REMERON) 7.5 MG tablet Take 7.5 mg by mouth at bedtime. 04/14/18  Yes [provider]  ondansetron (ZOFRAN) 4 MG tablet Take 4 mg by mouth every 8 (eight) hours as needed for nausea.  04/21/18  Yes [provider]  gabapentin (NEURONTIN) 300 MG capsule Take 2 capsules (600 mg total) by mouth  3 (three) times daily. Office visit needed for further refills Patient not taking: Reported on 04/30/2018 12/16/17   Anson Fret, MD  Multiple Vitamin (MULTIVITAMIN WITH MINERALS) TABS tablet Take 1 tablet by mouth daily. Patient not taking: Reported on 04/30/2018 04/13/17   Penny Pia, MD  Multiple Vitamins-Minerals (MULTIVITAMIN WITH MINERALS) tablet Take 1 tablet by mouth daily. Patient not taking: Reported on 04/30/2018 04/10/18   Palumbo, April, MD  potassium chloride 20 MEQ TBCR Take 10 mEq by  mouth 2 (two) times daily. Patient not taking: Reported on 04/30/2018 04/10/18   Palumbo, April, MD  thiamine (VITAMIN B-1) 100 MG tablet Take 1 tablet (100 mg total) by mouth daily. Patient not taking: Reported on 04/30/2018 04/10/18   Palumbo, April, MD  vitamin B-12 1000 MCG tablet Take 1 tablet (1,000 mcg total) by mouth daily. Patient not taking: Reported on 04/30/2018 02/07/17   Tyrone Nine, MD    Family History Family History  Problem Relation Age of Onset  . Heart disease Father   . Neuropathy Neg Hx     Social History Social History   Tobacco Use  . Smoking status: Current Every Day Smoker    Packs/day: 1.00    Types: Cigarettes  . Smokeless tobacco: Never Used  Substance Use Topics  . Alcohol use: Yes    Alcohol/week: 8.4 oz    Types: 14 Shots of liquor per week    Comment: states., "I keep a glass with me", reports drinking today (04/09/17)  . Drug use: No     Allergies   Naltrexone and Penicillins   Review of Systems Review of Systems  Constitutional: Negative for fever.  Respiratory: Positive for shortness of breath. Negative for cough.   Cardiovascular: Negative for chest pain.  Gastrointestinal: Negative for abdominal pain and vomiting.  Genitourinary: Negative for dysuria.  Neurological: Positive for weakness and light-headedness. Negative for dizziness.  All other systems reviewed and are negative.    Physical Exam Updated Vital Signs BP 104/73   Pulse 71   Temp (!) 97.5 F (36.4 C) (Oral)   Resp 20   Wt 52.2 kg (115 lb)   SpO2 96%   BMI 18.01 kg/m   Physical Exam  Constitutional: She is oriented to person, place, and time. She appears well-developed. No distress.  Frail, chronically malnourished appearing  HENT:  Head: Normocephalic and atraumatic.  Right Ear: External ear normal.  Left Ear: External ear normal.  Nose: Nose normal.  Mouth/Throat: Mucous membranes are dry.  Eyes: Pupils are equal, round, and reactive to light. EOM are  normal. Right eye exhibits no discharge. Left eye exhibits no discharge.  Cardiovascular: Normal rate, regular rhythm and normal heart sounds.  Pulmonary/Chest: Effort normal and breath sounds normal.  Abdominal: Soft. There is tenderness in the left upper quadrant.  Neurological: She is alert and oriented to person, place, and time.  CN 3-12 grossly intact. 5/5 strength in all 4 extremities but seems a little weaker in bilateral legs. Grossly normal sensation. Normal finger to nose.   Skin: Skin is warm and dry. She is not diaphoretic.  Nursing note and vitals reviewed.    ED Treatments / Results  Labs (all labs ordered are listed, but only abnormal results are displayed) Labs Reviewed  COMPREHENSIVE METABOLIC PANEL - Abnormal; Notable for the following components:      Result Value   Potassium 3.1 (*)    CO2 21 (*)    BUN 26 (*)    Calcium  8.3 (*)    Total Protein 5.4 (*)    Albumin 2.4 (*)    AST 44 (*)    Alkaline Phosphatase 160 (*)    All other components within normal limits  CBC WITH DIFFERENTIAL/PLATELET - Abnormal; Notable for the following components:   RBC 3.54 (*)    Hemoglobin 11.7 (*)    MCV 102.3 (*)    RDW 17.0 (*)    All other components within normal limits  D-DIMER, QUANTITATIVE (NOT AT [facility]) - Abnormal; Notable for the following components:   D-Dimer, Quant 1.29 (*)    All other components within normal limits  URINALYSIS, ROUTINE W REFLEX MICROSCOPIC - Abnormal; Notable for the following components:   Specific Gravity, Urine 1.033 (*)    Ketones, ur 20 (*)    All other components within normal limits  ETHANOL  LIPASE, BLOOD  I-STAT TROPONIN, ED    EKG None  Radiology Dg Chest 2 View  Result Date: 04/30/2018 CLINICAL DATA:  Weakness for 3 days, shortness of breath.  Smoker. EXAM: CHEST - 2 VIEW COMPARISON:  Chest radiograph Apr 23, 2018 FINDINGS: Cardiomediastinal silhouette is normal. Calcified aortic arch. No pleural effusions or focal  consolidations. Hyperinflation. Biapical pleuroparenchymal scarring. Trachea projects midline and there is no pneumothorax. Soft tissue planes and included osseous structures are non-suspicious. Surgical clips in the included right abdomen compatible with cholecystectomy. Faint calcifications in the neck are likely vascular. IMPRESSION: Hyperinflation without focal consolidation. Aortic Atherosclerosis (ICD10-I70.0). Electronically Signed   By: Awilda Metro M.D.   On: 04/30/2018 17:30   Ct Head Wo Contrast  Result Date: 04/30/2018 CLINICAL DATA:  Headache EXAM: CT HEAD WITHOUT CONTRAST TECHNIQUE: Contiguous axial images were obtained from the base of the skull through the vertex without intravenous contrast. COMPARISON:  04/23/2018 FINDINGS: Brain: There is atrophy and chronic small vessel disease changes. No acute intracranial abnormality. Specifically, no hemorrhage, hydrocephalus, mass lesion, acute infarction, or significant intracranial injury. Vascular: No hyperdense vessel or unexpected calcification. Skull: No acute calvarial abnormality. Sinuses/Orbits: Visualized paranasal sinuses and mastoids clear. Orbital soft tissues unremarkable. Other: None IMPRESSION: No acute intracranial abnormality. Atrophy, chronic microvascular disease. Electronically Signed   By: Charlett Nose M.D.   On: 04/30/2018 17:20   Ct Angio Chest Pe W/cm &/or Wo Cm  Result Date: 04/30/2018 CLINICAL DATA:  Elevated D-dimer.  Weakness. EXAM: CT ANGIOGRAPHY CHEST WITH CONTRAST TECHNIQUE: Multidetector CT imaging of the chest was performed using the standard protocol during bolus administration of intravenous contrast. Multiplanar CT image reconstructions and MIPs were obtained to evaluate the vascular anatomy. CONTRAST:  90 mL ISOVUE-370 IOPAMIDOL (ISOVUE-370) INJECTION 76% COMPARISON:  Chest x-ray today FINDINGS: Cardiovascular: Coronary artery and aortic atherosclerosis. Heart is upper limits normal in size. No filling  defects in the pulmonary arteries to suggest pulmonary emboli. Mediastinum/Nodes: No mediastinal, hilar, or axillary adenopathy. Lungs/Pleura: Mild to moderate centrilobular emphysema. No confluent airspace opacities or effusions. Biapical scarring. Upper Abdomen: Imaging into the upper abdomen shows no acute findings. Musculoskeletal: Chest wall soft tissues are unremarkable. No acute bony abnormality. Review of the MIP images confirms the above findings. IMPRESSION: No evidence of pulmonary embolus. No acute cardiopulmonary disease. Aortic Atherosclerosis (ICD10-I70.0) and Emphysema (ICD10-J43.9). Electronically Signed   By: Charlett Nose M.D.   On: 04/30/2018 19:41    Procedures Procedures (including critical care time)  Medications Ordered in ED Medications  sodium chloride 0.9 % bolus 1,000 mL (0 mLs Intravenous Stopped 04/30/18 1841)  lactated ringers bolus 1,000 mL (0  mLs Intravenous Stopped 04/30/18 2059)  thiamine (B-1) injection 100 mg (100 mg Intravenous Given 04/30/18 1912)  potassium chloride 20 MEQ/15ML (10%) solution 40 mEq (40 mEq Oral Given 04/30/18 1912)  iopamidol (ISOVUE-370) 76 % injection 100 mL (90 mLs Intravenous Contrast Given 04/30/18 1920)     Initial Impression / Assessment and Plan / ED Course  I have reviewed the triage vital signs and the nursing notes.  Pertinent labs & imaging results that were available during my care of the patient were reviewed by me and considered in my medical decision making (see chart for details).     Patient seems to be doing better with IV fluids.  Likely dehydration and malnutrition are the main cause of her symptoms.  The headache is the same as it has been and with her weakness symptoms a CT was obtained but is negative.  Given the exertional shortness of breath, work-up for PE obtained and is negative as well.  I think this shortness of breath is more from overall weakness and malnutrition as well as dehydration.  Doubt ACS.  Mild  hypokalemia.  She was given a dose of potassium in the ED and states she has leftover potassium that she did not take from previous visit and will take this.  She was able to ambulate and appears at baseline.  We have discussed needing to cut back on alcohol abuse but also the importance of doing this in a medically supervised setting with her PCP for referral.  We otherwise discussed return precautions but I think mainly alcohol is the contributing factor to her symptoms.  Return precautions.  Final Clinical Impressions(s) / ED Diagnoses   Final diagnoses:  Generalized weakness  Hypokalemia  Dehydration    ED Discharge Orders    None       Pricilla Loveless, MD 04/30/18 2316

## 2018-04-30 NOTE — Discharge Instructions (Addendum)
Is important to cut back on alcohol use.  You need to follow-up with your primary care doctor to help you this in a medically assisted manner.  it is important to increase your nutrition whether to be from food or shakes like boost.  Drink plenty of fluids.  If you develop recurrent symptoms or any other new/concerning symptoms you may return to the ER for evaluation.

## 2018-04-30 NOTE — ED Notes (Signed)
Pt. Placed on purewick per RN,Patty.

## 2018-04-30 NOTE — ED Notes (Signed)
Patient ambulated with a mostly steady gait and stand by assistance.

## 2018-04-30 NOTE — ED Notes (Signed)
Patient transported to CT 

## 2018-04-30 NOTE — ED Notes (Signed)
Bed: ZO10 Expected date:  Expected time:  Means of arrival:  Comments: 72 yo weakness

## 2018-05-05 DIAGNOSIS — R634 Abnormal weight loss: Secondary | ICD-10-CM | POA: Diagnosis not present

## 2018-05-05 DIAGNOSIS — R1011 Right upper quadrant pain: Secondary | ICD-10-CM | POA: Diagnosis not present

## 2018-05-05 DIAGNOSIS — R627 Adult failure to thrive: Secondary | ICD-10-CM | POA: Diagnosis not present

## 2018-05-05 DIAGNOSIS — F1020 Alcohol dependence, uncomplicated: Secondary | ICD-10-CM | POA: Diagnosis not present

## 2018-05-05 DIAGNOSIS — E876 Hypokalemia: Secondary | ICD-10-CM | POA: Diagnosis not present

## 2018-05-05 DIAGNOSIS — R5383 Other fatigue: Secondary | ICD-10-CM | POA: Diagnosis not present

## 2018-05-16 ENCOUNTER — Other Ambulatory Visit: Payer: Self-pay | Admitting: Gastroenterology

## 2018-05-16 DIAGNOSIS — R1011 Right upper quadrant pain: Secondary | ICD-10-CM | POA: Diagnosis not present

## 2018-05-16 DIAGNOSIS — R1084 Generalized abdominal pain: Secondary | ICD-10-CM

## 2018-05-16 DIAGNOSIS — F1010 Alcohol abuse, uncomplicated: Secondary | ICD-10-CM | POA: Diagnosis not present

## 2018-05-16 DIAGNOSIS — K862 Cyst of pancreas: Secondary | ICD-10-CM | POA: Diagnosis not present

## 2018-05-16 DIAGNOSIS — K760 Fatty (change of) liver, not elsewhere classified: Secondary | ICD-10-CM | POA: Diagnosis not present

## 2018-05-17 ENCOUNTER — Ambulatory Visit: Payer: PPO | Admitting: Neurology

## 2018-05-17 VITALS — BP 91/62 | HR 114 | Wt 85.8 lb

## 2018-05-17 DIAGNOSIS — R531 Weakness: Secondary | ICD-10-CM

## 2018-05-17 DIAGNOSIS — W19XXXA Unspecified fall, initial encounter: Secondary | ICD-10-CM

## 2018-05-17 DIAGNOSIS — R627 Adult failure to thrive: Secondary | ICD-10-CM

## 2018-05-17 DIAGNOSIS — G3281 Cerebellar ataxia in diseases classified elsewhere: Secondary | ICD-10-CM | POA: Diagnosis not present

## 2018-05-17 DIAGNOSIS — M6289 Other specified disorders of muscle: Secondary | ICD-10-CM | POA: Diagnosis not present

## 2018-05-17 DIAGNOSIS — R270 Ataxia, unspecified: Secondary | ICD-10-CM | POA: Diagnosis not present

## 2018-05-17 DIAGNOSIS — Z79899 Other long term (current) drug therapy: Secondary | ICD-10-CM

## 2018-05-17 NOTE — Progress Notes (Signed)
[facility]    Provider:  Dr Jaynee Eagles Referring Provider: Aletha Halim., PA-C Primary Care Physician:  Aletha Halim., PA-C  CC:  Alcoholic neuropathy, weakness  Interval history : She is getting weaker and weaker, less able to every day duties, she doesn't feel goods. Above the knee feels fairly normal. She can walk up steps and hold arms overhead. She is having falls. She fell twice. The weakness is in the ankle. She has not been to physical therapy. Discussed not stopping alcohol abruptly. No back pain. No neck pain. No joint pain. Losing weight. She is very frail. Her BMI is 13. Daughter provides most information.   Interval History: April [name] is a 72 y.o. female who looks older than stated age here as a referral from Dr. Deatra Ina for Alcoholic neuropathy. PMHx of chronic venous insufficiency, depression, anxiety, tobacco and alcohol abuse. Patient continues to drink brandy at breakfast lunch and dinner. Neuropathy follow up for both legs. Pt is takin gabpentin for shingles last took lyrica before May 2017. Pt cannot afford the 45.00 co payment for Lyrica. She is on Amitriptyline for the pain. Brandy at least 2 bottles a week or more for . She drinks it throughout the day. Pain is worsening in the feet.   HPI:  April [name] is a 72 y.o. female who looks older than stated age here as a referral from Dr. Deatra Ina for neuropathy. PMHx of chronic venous insufficiency, depression, anxiety, tobacco and alcohol abuse. Numbness in the feet started a year ago. She had shingles and her legs started swelling with the neurontin. She has had swelling in the legs since then. The feel feet tight. The swelling has improved. She has tightness from the ankles to her knees. She has numbness, tingling in the feet. She has problems with balance due to the sensation in the feet. The pain can be 7/10 in pain. Continuous pain. She is not diabetic. She tried Lyrica 132m twice daily for a  week then stopped. Progressively worsening. Denies diabetes. She drinks brandy every day Brandy, at least 2 bottles a week, She smokes a pack of cigarrettes a day. She only treid Lyrica for a week. She denies cramping in her feet. She has weakness in the legs.  She is here with her daughter who also provides information.   Reviewed notes, labs and imaging from outside physicians, which showed:  Notes from [facility]: DTYRIKA NEWMANis a 72y.o. ([id number] female who presents with chief complaint: bilateral ankle numbness. Patient unable to determine exactly when it began but she think it may have started when she had an episode of shingles in the right leg during this year. Patient notes, onset of swelling after she started taking medications for the right leg shingles. The patient's symptoms include: intermittent paraesthesia and anesthesia in both heels. No obvious trigger or allievators. . The patient has had no history of DVT, no history of pregnancy, known history of varicose vein, no history of venous stasis ulcers, no history of Lymphedema and known history of skin changes in lower legs. There is no family history of venous disorders. The patient has not routinely used compression stockings in the past.   RIGHT LOWER EXTREMITY  Common Femoral Vein: No evidence of thrombus. Normal compressibility, respiratory phasicity and response to augmentation.  Saphenofemoral Junction: No evidence of thrombus. Normal compressibility and flow on color Doppler imaging.  Profunda Femoral Vein: No evidence of thrombus. Normal compressibility and flow  on color Doppler imaging.  Femoral Vein: No evidence of thrombus. Normal compressibility, respiratory phasicity and response to augmentation.  Popliteal Vein: No evidence of thrombus. Normal compressibility, respiratory phasicity and response to augmentation.  Calf Veins: No evidence of thrombus. Normal compressibility  and flow on color Doppler imaging.  Superficial Great Saphenous Vein: No evidence of thrombus. Normal compressibility and flow on color Doppler imaging.  Venous Reflux: None.  Other Findings: None.  LEFT LOWER EXTREMITY  Common Femoral Vein: No evidence of thrombus. Normal compressibility, respiratory phasicity and response to augmentation.  Saphenofemoral Junction: No evidence of thrombus. Normal compressibility and flow on color Doppler imaging.  Profunda Femoral Vein: No evidence of thrombus. Normal compressibility and flow on color Doppler imaging.  Femoral Vein: No evidence of thrombus. Normal compressibility,respiratory phasicity and response to augmentation.  Popliteal Vein: No evidence of thrombus. Normal compressibility,respiratory phasicity and response to augmentation.  Calf Veins: No evidence of thrombus. Normal compressibility and flow on color Doppler imaging.  Superficial Great Saphenous Vein: No evidence of thrombus. Normal compressibility and flow on color Doppler imaging.  Venous Reflux: None.  Other Findings: None.  IMPRESSION: No evidence of deep venous thrombosis  Review of Systems: Patient complains of symptoms per HPI as well as the following symptoms: swelling in the legs, aching muscles, numbness in feet. Pertinent negatives per HPI. All others negative.    Social History   Socioeconomic History  . Marital status: Widowed    Spouse name: Not on file  . Number of children: 1  . Years of education: 6  . Highest education level: Not on file  Occupational History  . Not on file  Social Needs  . Financial resource strain: Not on file  . Food insecurity:    Worry: Not on file    Inability: Not on file  . Transportation needs:    Medical: Not on file    Non-medical: Not on file  Tobacco Use  . Smoking status: Current Every Day Smoker    Packs/day: 1.00    Types: Cigarettes  . Smokeless tobacco: Never Used  Substance  and Sexual Activity  . Alcohol use: Yes    Alcohol/week: 8.4 oz    Types: 14 Shots of liquor per week    Comment: states., "I keep a glass with me", reports drinking today (04/09/17)  . Drug use: No  . Sexual activity: Not on file  Lifestyle  . Physical activity:    Days per week: Not on file    Minutes per session: Not on file  . Stress: Not on file  Relationships  . Social connections:    Talks on phone: Not on file    Gets together: Not on file    Attends religious service: Not on file    Active member of club or organization: Not on file    Attends meetings of clubs or organizations: Not on file    Relationship status: Not on file  . Intimate partner violence:    Fear of current or ex partner: Not on file    Emotionally abused: Not on file    Physically abused: Not on file    Forced sexual activity: Not on file  Other Topics Concern  . Not on file  Social History Narrative   Lives at home with daughter Tammi Klippel   Caffeine use: Drinks coffee 1/day    Family History  Problem Relation Age of Onset  . Heart disease Father   . Neuropathy Neg Hx  Past Medical History:  Diagnosis Date  . Anxiety   . Current smoker   . Depression   . ETOH abuse   . GERD (gastroesophageal reflux disease)   . History of shingles summer 2015  . HTN (hypertension)   . Hyperlipidemia   . Hypertension   . Leg edema   . Neuropathy   . Neuropathy   . Pancreatitis     Past Surgical History:  Procedure Laterality Date  . ERCP N/A 02/01/2017   Procedure: ENDOSCOPIC RETROGRADE CHOLANGIOPANCREATOGRAPHY (ERCP);  Surgeon: Teena Irani, MD;  Location: [facility];  Service: Endoscopy;  Laterality: N/A;  . LAPAROSCOPIC CHOLECYSTECTOMY WITH COMMON DUCT EXPLORATION AND INTRAOP CHOLANGIOGRAM N/A 02/03/2017   Procedure: LAPAROSCOPIC CHOLECYSTECTOMY WITH POSSIBLE COMMON DUCT EXPLORATION AND INTRAOP CHOLANGIOGRAM;  Surgeon: Alphonsa Overall, MD;  Location: [facility];  Service: General;  Laterality: N/A;  .  removal of uterine fibroids  2004    Current Outpatient Medications  Medication Sig Dispense Refill  . amitriptyline (ELAVIL) 25 MG tablet Take 25 mg by mouth at bedtime as needed for sleep.   0  . clonazePAM (KLONOPIN) 0.5 MG tablet Take 0.5 mg by mouth 2 (two) times daily as needed for anxiety. as directed  0  . mirtazapine (REMERON) 7.5 MG tablet Take 7.5 mg by mouth at bedtime.  3  . Multiple Vitamin (MULTIVITAMIN WITH MINERALS) TABS tablet Take 1 tablet by mouth daily. 30 tablet 0  . ondansetron (ZOFRAN) 4 MG tablet Take 4 mg by mouth every 8 (eight) hours as needed for nausea.     . potassium chloride 20 MEQ TBCR Take 10 mEq by mouth 2 (two) times daily. 10 tablet 0   No current facility-administered medications for this visit.     Allergies as of 05/17/2018 - Review Complete 05/17/2018  Allergen Reaction Noted  . Naltrexone Shortness Of Breath 04/20/2016  . Penicillins Rash 08/16/2015    Vitals: BP 91/62   Pulse (!) 114   Wt 85 lb 12.8 oz (38.9 kg)   BMI 13.44 kg/m  Last Weight:  Wt Readings from Last 1 Encounters:  05/17/18 85 lb 12.8 oz (38.9 kg)   Last Height:   Ht Readings from Last 1 Encounters:  10/04/17 '5\' 7"'  (1.702 m)   Neuro: Detailed Neurologic Exam. Thin, frail  Speech:    Speech is normal; fluent and spontaneous with normal comprehension.  Cognition:    The patient is oriented to person, place, and time;     recent and remote memory intact;     language fluent;     normal attention, concentration,     fund of knowledge Cranial Nerves:    The pupils are equal, round, and reactive to light. The fundi are flat.  Visual fields are full to finger confrontation. Extraocular movements are intact. Trigeminal sensation is intact and the muscles of mastication are normal. The face is symmetric. The palate elevates in the midline. Hearing intact. Voice is normal. Shoulder shrug is normal. The tongue has normal motion without fasciculations.   Coordination:     Normal finger to nose and heel to shin.   Gait:   mildly wide based no steppage gait  Motor Observation:    Generalized atrophy Tone:    decreased tone  Posture:   slightly stooped    Strength:    Strength is V/V in the upper and lower limbs.      Sensation: decr to pp and temp in glove and stocking distribution.  Reflex Exam:  DTR's: Absent AJs. Otherwise deep tendon reflexes in the upper and lower extremities are brisk bilaterally.   Toes:    The toes are downgoing bilaterally.   Clonus:    Clonus is absent.       Assessment/Plan:   72 y.o. Frail female who looks older than stated age here for follow up. PMHx of chronic venous insufficiency, depression, anxiety, tobacco and alcohol abuse. Originally seen for peripheral neuropathy and diagnosed with  peripheral polyneuropathy from alcohol abuse. She is very thin and frail today, BMI is 13, continues alcohol daily and smoking.    In the past  daughter and mother were both resistant to the idea that alcohol can cause neuropathy and stating that alcohol helps with mother's depression and other problems; at that time I highly encouraged meeting with primary care to make a plan to decrease alcohol use as well as smoking cessation. She had said afford Lyrica but drinks Brandy all day.   - Today they are more receptive to alcohol cessation, asked them to meet with pcp for this and warned stopping alcohol abruptly can cause withdrawal and death - She has lost a significant amount of weight, recommend pcp look for etiologies such as malignancies, asked patient to follow up with pcp for this - we will check labs as well as an emg/ncs -  Home physical therapy for weakness - In the past, her  folate was abnormal I recommended daily folic acid supplementation. B12 was WNL.  Do  Recommended daily multivitamin and calcium with vitamin D.  - MRI of the brain due to ataxia, rapidly progressive weakness, falls,   Orders Placed  This Encounter  Procedures  . MR BRAIN W WO CONTRAST  . CK  . TSH  . HIV antibody  . Sedimentation rate  . ANA w/Reflex  . Sjogren's syndrome antibods(ssa + ssb)  . B12 and Folate Panel  . RPR  . Pan-ANCA  . Hepatitis C antibody  . Heavy metals, blood  . Vitamin B6  . Multiple Myeloma Panel (SPEP&IFE w/QIG)  . Vitamin B1  . Basic Metabolic Panel  . Ambulatory referral to Home Health  . NCV with EMG(electromyography)     CC: KAPLAN,KRISTEN, PA-C   Sarina Ill, MD  [facility] [address] [place], [place] [address]  Phone [phone] Fax [phone]  A total of 45 minutes was spent face-to-face with this patient. Over half this time was spent on counseling patient on the alcoholic neuropathy diagnosis and different diagnostic and therapeutic options available.

## 2018-05-17 NOTE — Patient Instructions (Signed)
MRI brain EMG/NCS Labs   Electromyoneurogram Electromyoneurogram is a test to check how well your muscles and nerves are working. This procedure includes the combined use of electromyogram (EMG) and nerve conduction study (NCS). EMG is used to look for muscular disorders. NCS, which is also called electroneurogram, measures how well your nerves are controlling your muscles. The procedures are usually performed together to check if your muscles and nerves are healthy. If the reaction to testing is abnormal, this can indicate disease or injury, such as peripheral nerve damage. Tell a health care provider about:  Any allergies you have.  All medicines you are taking, including vitamins, herbs, eye drops, creams, and over-the-counter medicines.  Any problems you or family members have had with anesthetic medicines.  Any blood disorders you have.  Any surgeries you have had.  Any medical conditions you have.  Any pacemaker you have. What are the risks? Generally, this is a safe procedure. However, problems may occur, including:  Infection where the electrodes were inserted.  Bleeding.  What happens before the procedure?  Ask your health care provider about: ? Changing or stopping your regular medicines. This is especially important if you are taking diabetes medicines or blood thinners. ? Taking medicines such as aspirin and ibuprofen. These medicines can thin your blood. Do not take these medicines before your procedure if your health care provider instructs you not to.  Your health care provider may ask you to avoid: ? Caffeine, such as coffee and tea. ? Nicotine. This includes cigarettes and anything with tobacco.  Do not use lotions or creams on the same day that you will be having the procedure. What happens during the procedure? For EMG:  Your health care provider will ask you to stay in a position so that he or she can access the muscle that will be studied. You may be  standing, sitting down, or lying down.  You may be given a medicine that numbs the area (local anesthetic).  A very thin needle that has an electrode on it will be inserted into your muscle.  Another small electrode will be placed on your skin near the muscle.  Your health care provider will ask you to continue to remain still.  The electrodes will send a signal that tells about the electrical activity of your muscles. You may see this on a monitor or hear it in the room.  After your muscles have been studied at rest, your health care provider will ask you to contract or flex your muscles. The electrodes will send a signal that tells about the electrical activity of your muscles.  Your health care provider will remove the electrodes and the electrode needles when the procedure is finished. The procedure may vary among health care providers and hospitals. For NCS:  An electrode that records your nerve activity (recording electrode) will be placed on your skin by the muscle that is being studied.  An electrode that is used as a reference (reference electrode) will be placed near the recording electrode.  A paste or gel will be applied to your skin between the recording electrode and the reference electrode.  Your nerve will be stimulated with a mild shock. Your health care provider will measure how much time it takes for your muscle to react.  Your health care provider will remove the electrodes and the gel when the procedure is finished. The procedure may vary among health care providers and hospitals. What happens after the procedure?  It is  your responsibility to obtain your test results. Ask your health care provider or the department performing the test when and how you will get your results.  Your health care provider may: ? Give you medicines for any pain. ? Monitor the insertion sites to make sure that they stop bleeding. This information is not intended to replace advice  given to you by your health care provider. Make sure you discuss any questions you have with your health care provider. Document Released: 04/02/2005 Document Revised: 05/07/2016 Document Reviewed: 01/21/2015 Elsevier Interactive Patient Education  2018 [employer]

## 2018-05-18 ENCOUNTER — Telehealth: Payer: Self-pay | Admitting: Neurology

## 2018-05-18 DIAGNOSIS — R627 Adult failure to thrive: Secondary | ICD-10-CM | POA: Insufficient documentation

## 2018-05-18 DIAGNOSIS — W19XXXA Unspecified fall, initial encounter: Secondary | ICD-10-CM | POA: Insufficient documentation

## 2018-05-18 NOTE — Telephone Encounter (Signed)
Health team auth: [employer] via their website order sent to GI. They will reach out to the pt to schedule.  °

## 2018-05-20 LAB — PAN-ANCA
ANCA Proteinase 3: <3.5 U/mL (ref 0.0–3.5)
Atypical pANCA: <1:20 {titer}
C-ANCA: <1:20 {titer}
Myeloperoxidase Ab: <9 U/mL (ref 0.0–9.0)

## 2018-05-20 LAB — MULTIPLE MYELOMA PANEL, SERUM
ALPHA2 GLOB SERPL ELPH-MCNC: 0.7 g/dL (ref 0.4–1.0)
Albumin SerPl Elph-Mcnc: 3.5 g/dL (ref 2.9–4.4)
Albumin/Glob SerPl: 1.3 (ref 0.7–1.7)
Alpha 1: 0.3 g/dL (ref 0.0–0.4)
B-Globulin SerPl Elph-Mcnc: 1 g/dL (ref 0.7–1.3)
Gamma Glob SerPl Elph-Mcnc: 0.9 g/dL (ref 0.4–1.8)
Globulin, Total: 2.8 g/dL (ref 2.2–3.9)
IGM (IMMUNOGLOBULIN M), SRM: 190 mg/dL (ref 26–217)
IgA/Immunoglobulin A, Serum: 435 mg/dL — ABNORMAL HIGH (ref 87–352)
IgG (Immunoglobin G), Serum: 852 mg/dL (ref 700–1600)
Total Protein: 6.3 g/dL (ref 6.0–8.5)

## 2018-05-20 LAB — CK: Total CK: 16 U/L — ABNORMAL LOW (ref 24–173)

## 2018-05-20 LAB — BASIC METABOLIC PANEL
BUN / CREAT RATIO: 38 — AB (ref 12–28)
BUN: 19 mg/dL (ref 8–27)
CHLORIDE: 108 mmol/L — AB (ref 96–106)
CO2: 19 mmol/L — AB (ref 20–29)
Calcium: 9.4 mg/dL (ref 8.7–10.3)
Creatinine, Ser: 0.5 mg/dL — ABNORMAL LOW (ref 0.57–1.00)
GFR calc Af Amer: 113 mL/min/{1.73_m2} (ref >59)
GFR calc non Af Amer: 98 mL/min/{1.73_m2} (ref >59)
GLUCOSE: 90 mg/dL (ref 65–99)
Potassium: 3.9 mmol/L (ref 3.5–5.2)
Sodium: 144 mmol/L (ref 134–144)

## 2018-05-20 LAB — HEAVY METALS, BLOOD
ARSENIC: 5 ug/L (ref 2–23)
LEAD, BLOOD: 8 ug/dL — AB (ref 0–4)
Mercury: 1.9 ug/L (ref 0.0–14.9)

## 2018-05-20 LAB — TSH: TSH: 3.81 u[IU]/mL (ref 0.450–4.500)

## 2018-05-20 LAB — B12 AND FOLATE PANEL
Folate: 4.6 ng/mL (ref >3.0)
VITAMIN B 12: 450 pg/mL (ref 232–1245)

## 2018-05-20 LAB — ANA W/REFLEX: Anti Nuclear Antibody(ANA): NEGATIVE

## 2018-05-20 LAB — HIV ANTIBODY (ROUTINE TESTING W REFLEX): HIV SCREEN 4TH GENERATION: NONREACTIVE

## 2018-05-20 LAB — SEDIMENTATION RATE: SED RATE: 13 mm/h (ref 0–40)

## 2018-05-20 LAB — SJOGREN'S SYNDROME ANTIBODS(SSA + SSB)

## 2018-05-20 LAB — VITAMIN B6: Vitamin B6: 2.1 ug/L (ref 2.0–32.8)

## 2018-05-20 LAB — HEPATITIS C ANTIBODY

## 2018-05-20 LAB — RPR: RPR Ser Ql: NONREACTIVE

## 2018-05-20 LAB — VITAMIN B1: THIAMINE: 154.3 nmol/L (ref 66.5–200.0)

## 2018-05-24 ENCOUNTER — Telehealth: Payer: Self-pay | Admitting: *Deleted

## 2018-05-24 NOTE — Telephone Encounter (Addendum)
Called pt & LVM asking for call back.    ----- Message from Anson FretAntonia B Ahern, MD sent at 05/24/2018 11:07 AM EDT ----- She is dehydrated. Her lead level is slightly elevated but nothing that I feel is concerning, does she live in an old house? Will forward results to pcp. Otherwise unremarkable no etiology found for her symptoms  Notes recorded by Anson FretAhern, Antonia B, MD on 05/22/2018 at 10:10 AM EDT Her blood lead levels(BLLs) came back borderline at a level 8, The [place] Department of Health and Human Services recommends that BLLs among all adults be reduced to <10 g/dL. . I don't think it is significant but I am going to reach out to hematology to make sure. Otherwise nothing in the labs to explain her symptoms.

## 2018-05-25 NOTE — Telephone Encounter (Addendum)
Spoke with Bjorn LoserBobbi Bolen, pt's daughter ([place] and she requested to receive the message regarding pt's labs. Discussed that pt is dehydrated and her lead level is slightly elevated, otherwise nothing to explain her symptoms. Dr. Lucia GaskinsAhern does not feel this is concerning (lead level), she will reach out to hematology to be sure. [name] stated they do live in an old house 1920[place], but unaware of any lead exposure. They have repainted since it was built. Discussed to encourage pt to drink more water and stay hydrated where her urine is a light yellow. She verbalized understanding and appreciation. RN encouraged her to call if she finds any updates about potential lead exposure and we will forward results to PCP.

## 2018-05-27 DIAGNOSIS — H6122 Impacted cerumen, left ear: Secondary | ICD-10-CM | POA: Diagnosis not present

## 2018-05-31 ENCOUNTER — Ambulatory Visit
Admission: RE | Admit: 2018-05-31 | Discharge: 2018-05-31 | Disposition: A | Discharge status: Home / Self Care | Payer: PPO | Source: Ambulatory Visit | Attending: Gastroenterology | Admitting: Gastroenterology

## 2018-05-31 DIAGNOSIS — R1084 Generalized abdominal pain: Secondary | ICD-10-CM

## 2018-05-31 DIAGNOSIS — K5730 Diverticulosis of large intestine without perforation or abscess without bleeding: Secondary | ICD-10-CM | POA: Diagnosis not present

## 2018-05-31 MED ORDER — IOPAMIDOL (ISOVUE-300) INJECTION 61%
100.0000 mL | Freq: Once | INTRAVENOUS | Status: AC | PRN
  Administered 2018-05-31: 100 mL via INTRAVENOUS

## 2018-06-30 ENCOUNTER — Encounter: Payer: PPO | Admitting: Neurology

## 2018-07-05 ENCOUNTER — Encounter: Payer: Self-pay | Admitting: Neurology

## 2018-07-06 ENCOUNTER — Encounter: Payer: Self-pay | Admitting: Neurology

## 2018-07-06 DIAGNOSIS — Z124 Encounter for screening for malignant neoplasm of cervix: Secondary | ICD-10-CM | POA: Diagnosis not present

## 2018-07-06 DIAGNOSIS — Z01419 Encounter for gynecological examination (general) (routine) without abnormal findings: Secondary | ICD-10-CM | POA: Diagnosis not present

## 2018-07-06 DIAGNOSIS — Z681 Body mass index (BMI) 19 or less, adult: Secondary | ICD-10-CM | POA: Diagnosis not present

## 2018-07-06 DIAGNOSIS — R634 Abnormal weight loss: Secondary | ICD-10-CM | POA: Diagnosis not present

## 2018-07-06 DIAGNOSIS — Z1231 Encounter for screening mammogram for malignant neoplasm of breast: Secondary | ICD-10-CM | POA: Diagnosis not present

## 2018-07-24 ENCOUNTER — Emergency Department (HOSPITAL_COMMUNITY): Payer: PPO

## 2018-07-24 ENCOUNTER — Other Ambulatory Visit: Payer: Self-pay

## 2018-07-24 ENCOUNTER — Inpatient Hospital Stay (HOSPITAL_COMMUNITY)
Admission: EM | Admit: 2018-07-24 | Discharge: 2018-07-26 | DRG: 915 | Disposition: A | Discharge status: Home Health | Payer: PPO | Attending: Family Medicine | Admitting: Family Medicine

## 2018-07-24 ENCOUNTER — Observation Stay (HOSPITAL_COMMUNITY): Payer: PPO

## 2018-07-24 ENCOUNTER — Encounter (HOSPITAL_COMMUNITY): Payer: Self-pay

## 2018-07-24 DIAGNOSIS — R531 Weakness: Secondary | ICD-10-CM | POA: Diagnosis not present

## 2018-07-24 DIAGNOSIS — T361X5A Adverse effect of cephalosporins and other beta-lactam antibiotics, initial encounter: Secondary | ICD-10-CM | POA: Diagnosis present

## 2018-07-24 DIAGNOSIS — T782XXA Anaphylactic shock, unspecified, initial encounter: Secondary | ICD-10-CM | POA: Diagnosis present

## 2018-07-24 DIAGNOSIS — F17210 Nicotine dependence, cigarettes, uncomplicated: Secondary | ICD-10-CM | POA: Diagnosis present

## 2018-07-24 DIAGNOSIS — G4700 Insomnia, unspecified: Secondary | ICD-10-CM | POA: Diagnosis present

## 2018-07-24 DIAGNOSIS — R627 Adult failure to thrive: Secondary | ICD-10-CM | POA: Diagnosis present

## 2018-07-24 DIAGNOSIS — T886XXA Anaphylactic reaction due to adverse effect of correct drug or medicament properly administered, initial encounter: Secondary | ICD-10-CM | POA: Diagnosis not present

## 2018-07-24 DIAGNOSIS — T17908A Unspecified foreign body in respiratory tract, part unspecified causing other injury, initial encounter: Secondary | ICD-10-CM

## 2018-07-24 DIAGNOSIS — R51 Headache: Secondary | ICD-10-CM | POA: Diagnosis not present

## 2018-07-24 DIAGNOSIS — F1010 Alcohol abuse, uncomplicated: Secondary | ICD-10-CM | POA: Diagnosis not present

## 2018-07-24 DIAGNOSIS — Z880 Allergy status to penicillin: Secondary | ICD-10-CM | POA: Diagnosis not present

## 2018-07-24 DIAGNOSIS — F329 Major depressive disorder, single episode, unspecified: Secondary | ICD-10-CM | POA: Diagnosis present

## 2018-07-24 DIAGNOSIS — F419 Anxiety disorder, unspecified: Secondary | ICD-10-CM | POA: Diagnosis not present

## 2018-07-24 DIAGNOSIS — R05 Cough: Secondary | ICD-10-CM

## 2018-07-24 DIAGNOSIS — I959 Hypotension, unspecified: Secondary | ICD-10-CM | POA: Diagnosis not present

## 2018-07-24 DIAGNOSIS — R109 Unspecified abdominal pain: Secondary | ICD-10-CM | POA: Diagnosis not present

## 2018-07-24 DIAGNOSIS — N390 Urinary tract infection, site not specified: Secondary | ICD-10-CM | POA: Diagnosis not present

## 2018-07-24 DIAGNOSIS — E43 Unspecified severe protein-calorie malnutrition: Secondary | ICD-10-CM | POA: Diagnosis not present

## 2018-07-24 DIAGNOSIS — K5900 Constipation, unspecified: Secondary | ICD-10-CM | POA: Diagnosis not present

## 2018-07-24 DIAGNOSIS — E876 Hypokalemia: Secondary | ICD-10-CM | POA: Diagnosis present

## 2018-07-24 DIAGNOSIS — E785 Hyperlipidemia, unspecified: Secondary | ICD-10-CM | POA: Diagnosis present

## 2018-07-24 DIAGNOSIS — R918 Other nonspecific abnormal finding of lung field: Secondary | ICD-10-CM | POA: Diagnosis not present

## 2018-07-24 DIAGNOSIS — Z79899 Other long term (current) drug therapy: Secondary | ICD-10-CM

## 2018-07-24 DIAGNOSIS — R1312 Dysphagia, oropharyngeal phase: Secondary | ICD-10-CM | POA: Diagnosis not present

## 2018-07-24 DIAGNOSIS — Z681 Body mass index (BMI) 19 or less, adult: Secondary | ICD-10-CM

## 2018-07-24 DIAGNOSIS — Z881 Allergy status to other antibiotic agents status: Secondary | ICD-10-CM

## 2018-07-24 DIAGNOSIS — A498 Other bacterial infections of unspecified site: Secondary | ICD-10-CM | POA: Diagnosis not present

## 2018-07-24 DIAGNOSIS — R1084 Generalized abdominal pain: Secondary | ICD-10-CM | POA: Diagnosis not present

## 2018-07-24 DIAGNOSIS — I472 Ventricular tachycardia: Secondary | ICD-10-CM | POA: Diagnosis present

## 2018-07-24 DIAGNOSIS — I10 Essential (primary) hypertension: Secondary | ICD-10-CM | POA: Diagnosis present

## 2018-07-24 DIAGNOSIS — R0902 Hypoxemia: Secondary | ICD-10-CM | POA: Diagnosis not present

## 2018-07-24 DIAGNOSIS — K219 Gastro-esophageal reflux disease without esophagitis: Secondary | ICD-10-CM | POA: Diagnosis present

## 2018-07-24 DIAGNOSIS — G629 Polyneuropathy, unspecified: Secondary | ICD-10-CM | POA: Diagnosis not present

## 2018-07-24 DIAGNOSIS — R059 Cough, unspecified: Secondary | ICD-10-CM

## 2018-07-24 DIAGNOSIS — R21 Rash and other nonspecific skin eruption: Secondary | ICD-10-CM | POA: Diagnosis present

## 2018-07-24 DIAGNOSIS — B9620 Unspecified Escherichia coli [E. coli] as the cause of diseases classified elsewhere: Secondary | ICD-10-CM | POA: Diagnosis not present

## 2018-07-24 LAB — URINALYSIS, ROUTINE W REFLEX MICROSCOPIC
BILIRUBIN URINE: NEGATIVE
Glucose, UA: NEGATIVE mg/dL
Ketones, ur: 5 mg/dL — AB
Nitrite: POSITIVE — AB
PH: 5 (ref 5.0–8.0)
Protein, ur: NEGATIVE mg/dL
SPECIFIC GRAVITY, URINE: 1.014 (ref 1.005–1.030)
WBC, UA: >50 WBC/hpf — ABNORMAL HIGH (ref 0–5)

## 2018-07-24 LAB — COMPREHENSIVE METABOLIC PANEL
ALK PHOS: 93 U/L (ref 38–126)
ALT: 8 U/L (ref 0–44)
AST: 15 U/L (ref 15–41)
Albumin: 3.1 g/dL — ABNORMAL LOW (ref 3.5–5.0)
Anion gap: 9 (ref 5–15)
BUN: 22 mg/dL (ref 8–23)
CALCIUM: 8.4 mg/dL — AB (ref 8.9–10.3)
CHLORIDE: 111 mmol/L (ref 98–111)
CO2: 22 mmol/L (ref 22–32)
CREATININE: 0.69 mg/dL (ref 0.44–1.00)
GFR calc Af Amer: >60 mL/min (ref >60)
Glucose, Bld: 114 mg/dL — ABNORMAL HIGH (ref 70–99)
Potassium: 2.9 mmol/L — ABNORMAL LOW (ref 3.5–5.1)
Sodium: 142 mmol/L (ref 135–145)
Total Bilirubin: 0.5 mg/dL (ref 0.3–1.2)
Total Protein: 5.9 g/dL — ABNORMAL LOW (ref 6.5–8.1)

## 2018-07-24 LAB — CBC WITH DIFFERENTIAL/PLATELET
Basophils Absolute: 0 10*3/uL (ref 0.0–0.1)
Basophils Relative: 0 %
Eosinophils Absolute: 0.1 10*3/uL (ref 0.0–0.7)
Eosinophils Relative: 2 %
HCT: 36.9 % (ref 36.0–46.0)
Hemoglobin: 12.8 g/dL (ref 12.0–15.0)
LYMPHS ABS: 2.5 10*3/uL (ref 0.7–4.0)
Lymphocytes Relative: 33 %
MCH: 35.4 pg — AB (ref 26.0–34.0)
MCHC: 34.7 g/dL (ref 30.0–36.0)
MCV: 101.9 fL — ABNORMAL HIGH (ref 78.0–100.0)
Monocytes Absolute: 0.6 10*3/uL (ref 0.1–1.0)
Monocytes Relative: 8 %
Neutro Abs: 4.3 10*3/uL (ref 1.7–7.7)
Neutrophils Relative %: 57 %
Platelets: 382 10*3/uL (ref 150–400)
RBC: 3.62 MIL/uL — AB (ref 3.87–5.11)
RDW: 14.6 % (ref 11.5–15.5)
WBC: 7.6 10*3/uL (ref 4.0–10.5)

## 2018-07-24 LAB — PROTIME-INR
INR: 1
PROTHROMBIN TIME: 13.1 s (ref 11.4–15.2)

## 2018-07-24 LAB — AMMONIA: AMMONIA: 15 umol/L (ref 9–35)

## 2018-07-24 LAB — I-STAT TROPONIN, ED: TROPONIN I, POC: 0 ng/mL (ref 0.00–0.08)

## 2018-07-24 LAB — ETHANOL: Alcohol, Ethyl (B): <10 mg/dL (ref <10)

## 2018-07-24 LAB — MAGNESIUM: Magnesium: 1.7 mg/dL (ref 1.7–2.4)

## 2018-07-24 MED ORDER — SODIUM CHLORIDE 0.9 % IV SOLN
1.0000 g | Freq: Once | INTRAVENOUS | Status: AC
  Administered 2018-07-24: 1 g via INTRAVENOUS
  Filled 2018-07-24: qty 10

## 2018-07-24 MED ORDER — SENNOSIDES-DOCUSATE SODIUM 8.6-50 MG PO TABS: 1.0000 | ORAL_TABLET | Freq: Every evening | ORAL | Status: DC | PRN

## 2018-07-24 MED ORDER — CEPHALEXIN 500 MG PO CAPS: 500.0000 mg | ORAL_CAPSULE | Freq: Two times a day (BID) | ORAL | 0 refills | Status: DC

## 2018-07-24 MED ORDER — EPINEPHRINE 0.3 MG/0.3ML IJ SOAJ
INTRAMUSCULAR | Status: AC
  Filled 2018-07-24: qty 0.3

## 2018-07-24 MED ORDER — POTASSIUM CHLORIDE CRYS ER 20 MEQ PO TBCR
40.0000 meq | EXTENDED_RELEASE_TABLET | Freq: Once | ORAL | Status: DC
  Filled 2018-07-24: qty 2

## 2018-07-24 MED ORDER — FAMOTIDINE IN NACL 20-0.9 MG/50ML-% IV SOLN
20.0000 mg | Freq: Two times a day (BID) | INTRAVENOUS | Status: DC
  Administered 2018-07-25 – 2018-07-26 (×3): 20 mg via INTRAVENOUS
  Filled 2018-07-24 (×3): qty 50

## 2018-07-24 MED ORDER — MAGNESIUM SULFATE 2 GM/50ML IV SOLN
2.0000 g | Freq: Once | INTRAVENOUS | Status: AC
  Administered 2018-07-24: 2 g via INTRAVENOUS
  Filled 2018-07-24: qty 50

## 2018-07-24 MED ORDER — OXYCODONE HCL 5 MG PO TABS
5.0000 mg | ORAL_TABLET | ORAL | Status: DC | PRN
  Administered 2018-07-26: 5 mg via ORAL
  Filled 2018-07-24: qty 1

## 2018-07-24 MED ORDER — POTASSIUM CHLORIDE ER 10 MEQ PO TBCR: 10.0000 meq | EXTENDED_RELEASE_TABLET | Freq: Every day | ORAL | 0 refills | Status: DC

## 2018-07-24 MED ORDER — METHYLPREDNISOLONE SODIUM SUCC 125 MG IJ SOLR
60.0000 mg | Freq: Four times a day (QID) | INTRAMUSCULAR | Status: DC
  Administered 2018-07-24 – 2018-07-25 (×3): 60 mg via INTRAVENOUS
  Filled 2018-07-24 (×3): qty 2

## 2018-07-24 MED ORDER — ONDANSETRON HCL 4 MG/2ML IJ SOLN: 4.0000 mg | Freq: Four times a day (QID) | INTRAMUSCULAR | Status: DC | PRN

## 2018-07-24 MED ORDER — THIAMINE HCL 100 MG/ML IJ SOLN
100.0000 mg | Freq: Once | INTRAMUSCULAR | Status: AC
  Administered 2018-07-24: 100 mg via INTRAVENOUS
  Filled 2018-07-24: qty 2

## 2018-07-24 MED ORDER — LORAZEPAM 2 MG/ML IJ SOLN: 1.0000 mg | Freq: Four times a day (QID) | INTRAMUSCULAR | Status: DC | PRN

## 2018-07-24 MED ORDER — ACETAMINOPHEN 325 MG PO TABS
650.0000 mg | ORAL_TABLET | Freq: Four times a day (QID) | ORAL | Status: DC | PRN
  Administered 2018-07-26 (×2): 650 mg via ORAL
  Filled 2018-07-24 (×2): qty 2

## 2018-07-24 MED ORDER — SODIUM CHLORIDE 0.9 % IV BOLUS
500.0000 mL | Freq: Once | INTRAVENOUS | Status: AC
  Administered 2018-07-24: 500 mL via INTRAVENOUS

## 2018-07-24 MED ORDER — POTASSIUM CHLORIDE 20 MEQ PO PACK
40.0000 meq | PACK | Freq: Two times a day (BID) | ORAL | Status: DC
  Administered 2018-07-24 – 2018-07-26 (×3): 40 meq via ORAL
  Filled 2018-07-24 (×4): qty 2

## 2018-07-24 MED ORDER — CIPROFLOXACIN IN D5W 400 MG/200ML IV SOLN
400.0000 mg | Freq: Two times a day (BID) | INTRAVENOUS | Status: DC
  Administered 2018-07-24 – 2018-07-26 (×4): 400 mg via INTRAVENOUS
  Filled 2018-07-24 (×4): qty 200

## 2018-07-24 MED ORDER — EPINEPHRINE 0.3 MG/0.3ML IJ SOAJ
INTRAMUSCULAR | Status: AC
  Administered 2018-07-24: 21:00:00
  Filled 2018-07-24: qty 0.3

## 2018-07-24 MED ORDER — SODIUM CHLORIDE 0.9 % IV SOLN
Freq: Once | INTRAVENOUS | Status: AC
  Administered 2018-07-24: 17:00:00 via INTRAVENOUS

## 2018-07-24 MED ORDER — ALBUTEROL SULFATE (2.5 MG/3ML) 0.083% IN NEBU
5.0000 mg | INHALATION_SOLUTION | Freq: Once | RESPIRATORY_TRACT | Status: AC
  Administered 2018-07-24: 5 mg via RESPIRATORY_TRACT
  Filled 2018-07-24: qty 6

## 2018-07-24 MED ORDER — VITAMIN B-1 100 MG PO TABS: 100.0000 mg | ORAL_TABLET | Freq: Every day | ORAL | 0 refills | Status: DC

## 2018-07-24 MED ORDER — ADULT MULTIVITAMIN W/MINERALS CH
1.0000 | ORAL_TABLET | Freq: Every day | ORAL | Status: DC
  Administered 2018-07-26: 1 via ORAL
  Filled 2018-07-24: qty 1

## 2018-07-24 MED ORDER — MAGNESIUM CITRATE PO SOLN: 1.0000 | Freq: Once | ORAL | Status: DC | PRN

## 2018-07-24 MED ORDER — CLONAZEPAM 0.125 MG PO TBDP
0.5000 mg | ORAL_TABLET | Freq: Two times a day (BID) | ORAL | Status: DC
  Administered 2018-07-24: 0.5 mg via ORAL
  Filled 2018-07-24: qty 4

## 2018-07-24 MED ORDER — METHYLPREDNISOLONE SODIUM SUCC 125 MG IJ SOLR
125.0000 mg | Freq: Once | INTRAMUSCULAR | Status: AC
  Administered 2018-07-24: 125 mg via INTRAVENOUS
  Filled 2018-07-24: qty 2

## 2018-07-24 MED ORDER — LORAZEPAM 1 MG PO TABS: 1.0000 mg | ORAL_TABLET | Freq: Four times a day (QID) | ORAL | Status: DC | PRN

## 2018-07-24 MED ORDER — DIPHENHYDRAMINE HCL 50 MG/ML IJ SOLN
25.0000 mg | Freq: Four times a day (QID) | INTRAMUSCULAR | Status: DC
  Administered 2018-07-24 – 2018-07-25 (×5): 25 mg via INTRAVENOUS
  Filled 2018-07-24 (×5): qty 1

## 2018-07-24 MED ORDER — SODIUM CHLORIDE 0.9 % IV SOLN
INTRAVENOUS | Status: DC
  Administered 2018-07-24 – 2018-07-25 (×2): via INTRAVENOUS
  Administered 2018-07-25: 75 mL/h via INTRAVENOUS

## 2018-07-24 MED ORDER — ONDANSETRON HCL 4 MG/2ML IJ SOLN
4.0000 mg | Freq: Once | INTRAMUSCULAR | Status: AC
  Administered 2018-07-24: 4 mg via INTRAVENOUS
  Filled 2018-07-24: qty 2

## 2018-07-24 MED ORDER — FAMOTIDINE IN NACL 20-0.9 MG/50ML-% IV SOLN
20.0000 mg | Freq: Once | INTRAVENOUS | Status: AC
  Administered 2018-07-24: 20 mg via INTRAVENOUS
  Filled 2018-07-24: qty 50

## 2018-07-24 MED ORDER — ONDANSETRON HCL 4 MG PO TABS: 4.0000 mg | ORAL_TABLET | Freq: Four times a day (QID) | ORAL | Status: DC | PRN

## 2018-07-24 MED ORDER — POTASSIUM CHLORIDE CRYS ER 10 MEQ PO TBCR
10.0000 meq | EXTENDED_RELEASE_TABLET | Freq: Two times a day (BID) | ORAL | Status: DC
  Administered 2018-07-24 – 2018-07-26 (×3): 10 meq via ORAL
  Filled 2018-07-24 (×3): qty 1

## 2018-07-24 MED ORDER — EPINEPHRINE 0.3 MG/0.3ML IJ SOAJ
0.3000 mg | Freq: Once | INTRAMUSCULAR | Status: AC
  Administered 2018-07-24: 0.3 mg via INTRAMUSCULAR

## 2018-07-24 MED ORDER — IPRATROPIUM-ALBUTEROL 0.5-2.5 (3) MG/3ML IN SOLN: 3.0000 mL | Freq: Four times a day (QID) | RESPIRATORY_TRACT | Status: DC | PRN

## 2018-07-24 MED ORDER — THIAMINE HCL 100 MG/ML IJ SOLN
Freq: Once | INTRAVENOUS | Status: AC
  Administered 2018-07-24: via INTRAVENOUS
  Filled 2018-07-24: qty 1000

## 2018-07-24 MED ORDER — ONDANSETRON 4 MG PO TBDP: 4.0000 mg | ORAL_TABLET | Freq: Three times a day (TID) | ORAL | 0 refills | Status: DC | PRN

## 2018-07-24 MED ORDER — ACETAMINOPHEN 650 MG RE SUPP
650.0000 mg | Freq: Four times a day (QID) | RECTAL | Status: DC | PRN
Start: 2018-07-24 — End: 2018-07-26

## 2018-07-24 MED ORDER — DIPHENHYDRAMINE HCL 50 MG/ML IJ SOLN
25.0000 mg | Freq: Once | INTRAMUSCULAR | Status: AC
  Administered 2018-07-24: 25 mg via INTRAVENOUS
  Filled 2018-07-24: qty 1

## 2018-07-24 MED ORDER — ENOXAPARIN SODIUM 30 MG/0.3ML ~~LOC~~ SOLN
30.0000 mg | Freq: Every day | SUBCUTANEOUS | Status: DC
  Administered 2018-07-24 – 2018-07-25 (×2): 30 mg via SUBCUTANEOUS
  Filled 2018-07-24 (×2): qty 0.3

## 2018-07-24 MED ORDER — ENSURE ENLIVE PO LIQD
237.0000 mL | Freq: Two times a day (BID) | ORAL | Status: DC
  Administered 2018-07-26: 237 mL via ORAL

## 2018-07-24 MED ORDER — BISACODYL 5 MG PO TBEC
5.0000 mg | DELAYED_RELEASE_TABLET | Freq: Every day | ORAL | Status: DC | PRN
Start: 2018-07-24 — End: 2018-07-25

## 2018-07-24 MED ORDER — FOLIC ACID 1 MG PO TABS: 1.0000 mg | ORAL_TABLET | Freq: Every day | ORAL | 0 refills | Status: DC

## 2018-07-24 NOTE — H&P (Signed)
History and Physical   [facility] [place] [facility] Admission History and Physical [employer], D.O.    Patient Name: April [name] MR#: [id number] Date of Birth: December 17, 1945 Date of Admission: 07/24/2018  Referring MD/NP/PA: Dr. Effie Shy Primary Care Physician: Richmond Campbell., PA-C  Chief Complaint:  Chief Complaint  Patient presents with  . Weakness    HPI: April [name] is a 72 y.o. female with a known history of anxiety, depression, EtOH abuse, HTN, HLD, GERD, presents to the emergency department for evaluation of UTI.  She rec'd Rocephin in the ED and was to be discharged home when she developed a rash proximal to the IV infusion site, weakness, confusion and hypotension.  She was treated for anaphylaxis with Epi, steroids, benadryl, H2 blockers and has improved greatly since.    Per daughter, mom has sustained a steady decline in functional capacity over the past few weeks with a dramatic decline over the past 24 hours.  She came to ER complaining of SOB, weakness, nausea, decreased appetite.  She has not been drinking as much alcohol lately, down to one glass a day,  This SmartLink has not been configured with any valid records.   drinks several days ago but she has been smoking 1 PPD for many years.   Medical admission has been requested for observation of anaphylactic reaction, UTI, hypokalemia and hypomagnesemia. . She is stable for med-surg floor. However, patient will likely need PT eval for consideration of rehab given significant debility.   Review of Systems:  CONSTITUTIONAL: Positive fatigue and weakness.  No fever/chills, fatigue, weight gain/loss, headache. EYES: No blurry or double vision. ENT: No tinnitus, postnasal drip, redness or soreness of the oropharynx. RESPIRATORY: Positive SOB. No cough, wheeze.  No hemoptysis.  CARDIOVASCULAR: No chest pain, palpitations, syncope, orthopnea. No lower extremity edema.  GASTROINTESTINAL: Positive  nausea, abdominal pain. No vomiting, diarrhea, constipation.  No hematemesis, melena or hematochezia. GENITOURINARY: No dysuria, frequency, hematuria. ENDOCRINE: No polyuria or nocturia. No heat or cold intolerance. HEMATOLOGY: No anemia, bruising, bleeding. INTEGUMENTARY: No rashes, ulcers, lesions. MUSCULOSKELETAL: No arthritis, gout, dyspnea. NEUROLOGIC: No numbness, tingling, ataxia, seizure-type activity, weakness. PSYCHIATRIC: No anxiety, depression, insomnia.   Past Medical History:  Diagnosis Date  . Anxiety   . Current smoker   . Depression   . ETOH abuse   . GERD (gastroesophageal reflux disease)   . History of shingles summer 2015  . HTN (hypertension)   . Hyperlipidemia   . Hypertension   . Leg edema   . Neuropathy   . Neuropathy   . Pancreatitis     Past Surgical History:  Procedure Laterality Date  . ERCP N/A 02/01/2017   Procedure: ENDOSCOPIC RETROGRADE CHOLANGIOPANCREATOGRAPHY (ERCP);  Surgeon: Dorena Cookey, MD;  Location: [facility];  Service: Endoscopy;  Laterality: N/A;  . LAPAROSCOPIC CHOLECYSTECTOMY WITH COMMON DUCT EXPLORATION AND INTRAOP CHOLANGIOGRAM N/A 02/03/2017   Procedure: LAPAROSCOPIC CHOLECYSTECTOMY WITH POSSIBLE COMMON DUCT EXPLORATION AND INTRAOP CHOLANGIOGRAM;  Surgeon: Ovidio Kin, MD;  Location: [facility];  Service: General;  Laterality: N/A;  . removal of uterine fibroids  2004     reports that she has been smoking cigarettes. She has been smoking about 1.00 pack per day. She has never used smokeless tobacco. She reports that she drinks about 14.0 standard drinks of alcohol per week. She reports that she does not use drugs.  Allergies  Allergen Reactions  . Naltrexone Shortness Of Breath  . Rocephin [Ceftriaxone Sodium In Dextrose] Anaphylaxis  . Penicillins  Rash    Has patient had a PCN reaction causing immediate rash, facial/tongue/throat swelling, SOB or lightheadedness with hypotension: Yes immediate rash  Has patient had a PCN reaction  causing severe rash involving mucus membranes or skin necrosis: Yes Has patient had a PCN reaction that required hospitalization": No Has patient had a PCN reaction occurring within the last 10 years: Yes If all of the above answers are "NO", then may proceed with Cephalosporin use.     Family History  Problem Relation Age of Onset  . Heart disease Father   . Neuropathy Neg Hx     Prior to Admission medications   Medication Sig Start Date End Date Taking? Authorizing Provider  amitriptyline (ELAVIL) 25 MG tablet Take 25 mg by mouth at bedtime as needed for sleep.  08/27/16  Yes [provider]  clonazePAM (KLONOPIN) 0.5 MG tablet Take 0.5 mg by mouth 2 (two) times daily as needed for anxiety. as directed 08/05/17  Yes [provider]  mirtazapine (REMERON) 7.5 MG tablet Take 7.5 mg by mouth at bedtime. 04/14/18  Yes [provider]  Multiple Vitamin (MULTIVITAMIN WITH MINERALS) TABS tablet Take 1 tablet by mouth daily. 04/13/17  Yes Penny PiaVega, [place], MD  potassium chloride (K-DUR,KLOR-CON) 10 MEQ tablet Take 10 mEq by mouth 2 (two) times daily.   Yes [provider]  folic acid (FOLVITE) 1 MG tablet Take 1 tablet (1 mg total) by mouth daily. 07/24/18   Tilden Fossaees, Elizabeth, MD  ondansetron (ZOFRAN ODT) 4 MG disintegrating tablet Take 1 tablet (4 mg total) by mouth every 8 (eight) hours as needed for nausea or vomiting. 07/24/18   Tilden Fossaees, Elizabeth, MD  thiamine (VITAMIN B-1) 100 MG tablet Take 1 tablet (100 mg total) by mouth daily. 07/24/18   Tilden Fossaees, Elizabeth, MD    Physical Exam: Vitals:   07/24/18 1836 07/24/18 1839 07/24/18 1842 07/24/18 1845  BP: 106/76 99/65 105/62 112/63  Pulse: 68 63 66 65  Resp: 17 (!) 22 (!) 21 14  Temp:      TempSrc:      SpO2: 100% 100% 100% 100%  Weight:      Height:        GENERAL: 72 y.o.-year-old female patient, well-developed, lying in the bed in no acute distress.  Generalized weakness. Pleasantly confused.  HEENT: Head  atraumatic, normocephalic. Pupils equal. Mucus membranes moist. NECK: Supple. No JVD. CHEST: Normal breath sounds bilaterally. No wheezing, rales, rhonchi or crackles. No use of accessory muscles of respiration.  No reproducible chest wall tenderness.  CARDIOVASCULAR: S1, S2 normal. No murmurs, rubs, or gallops. Cap refill <2 seconds. Pulses intact distally.  ABDOMEN: Mild diffuse tenderness. No rebound, guarding, rigidity. Normoactive bowel sounds present in all four quadrants.  EXTREMITIES: No pedal edema, cyanosis, or clubbing. No calf tenderness or Homan's sign.  NEUROLOGIC: The patient is alert and oriented x 3. Slow to respond to questions, somewhat confused. Cranial nerves II through XII are grossly intact with no focal sensorimotor deficit. PSYCHIATRIC:  Normal affect, mood, thought content. SKIN: Warm, dry, and intact without obvious rash, lesion, or ulcer.    Labs on Admission:  CBC: Recent Labs  Lab 07/24/18 1112  WBC 7.6  NEUTROABS 4.3  HGB 12.8  HCT 36.9  MCV 101.9*  PLT 382   Basic Metabolic Panel: Recent Labs  Lab 07/24/18 1112 07/24/18 1252  NA 142  --   K 2.9*  --   CL 111  --   CO2 22  --  GLUCOSE 114*  --   BUN 22  --   CREATININE 0.69  --   CALCIUM 8.4*  --   MG  --  1.7   GFR: Estimated Creatinine Clearance: 46.9 mL/min (by C-G formula based on SCr of 0.69 mg/dL). Liver Function Tests: Recent Labs  Lab 07/24/18 1112  AST 15  ALT 8  ALKPHOS 93  BILITOT 0.5  PROT 5.9*  ALBUMIN 3.1*   No results for input(s): LIPASE, AMYLASE in the last 168 hours. Recent Labs  Lab 07/24/18 1112  AMMONIA 15   Coagulation Profile: Recent Labs  Lab 07/24/18 1112  INR 1.00   Cardiac Enzymes: No results for input(s): CKTOTAL, CKMB, CKMBINDEX, TROPONINI in the last 168 hours. BNP (last 3 results) No results for input(s): PROBNP in the last 8760 hours. HbA1C: No results for input(s): HGBA1C in the last 72 hours. CBG: No results for input(s): GLUCAP in  the last 168 hours. Lipid Profile: No results for input(s): CHOL, HDL, LDLCALC, TRIG, CHOLHDL, LDLDIRECT in the last 72 hours. Thyroid Function Tests: No results for input(s): TSH, T4TOTAL, FREET4, T3FREE, THYROIDAB in the last 72 hours. Anemia Panel: No results for input(s): VITAMINB12, FOLATE, FERRITIN, TIBC, IRON, RETICCTPCT in the last 72 hours. Urine analysis:    Component Value Date/Time   COLORURINE YELLOW 07/24/2018 1050   APPEARANCEUR CLEAR 07/24/2018 1050   LABSPEC 1.014 07/24/2018 1050   PHURINE 5.0 07/24/2018 1050   GLUCOSEU NEGATIVE 07/24/2018 1050   HGBUR SMALL (A) 07/24/2018 1050   BILIRUBINUR NEGATIVE 07/24/2018 1050   KETONESUR 5 (A) 07/24/2018 1050   PROTEINUR NEGATIVE 07/24/2018 1050   NITRITE POSITIVE (A) 07/24/2018 1050   LEUKOCYTESUR LARGE (A) 07/24/2018 1050   Sepsis Labs: @LABRCNTIP (procalcitonin:4,lacticidven:4) )No results found for this or any previous visit (from the past 240 hour(s)).   Radiological Exams on Admission: Dg Chest 2 View  Result Date: 07/24/2018 CLINICAL DATA:  Weakness x1 week EXAM: CHEST - 2 VIEW COMPARISON:  CT chest dated 04/30/2018 FINDINGS: Lungs are clear.  No pleural effusion or pneumothorax. The heart is normal in size. Visualized osseous structures are within normal limits. IMPRESSION: Normal chest radiographs. Electronically Signed   By: Charline Bills M.D.   On: 07/24/2018 11:49   Ct Head Wo Contrast  Result Date: 07/24/2018 CLINICAL DATA:  Pt from home via EMS c/o weakness x1 week. Pt daughter reports that pt has not been eating well for approx 1 month but for the past week almost nothing to eat. Pt denies pain. Pt received 700 cc NS en route. Pt is A&O and in NAD. Pt placed on 2L d/t O2 being 86% on RA. Pt has been a lifelong smoker per EMS EXAM: CT HEAD WITHOUT CONTRAST TECHNIQUE: Contiguous axial images were obtained from the base of the skull through the vertex without intravenous contrast. COMPARISON:  04/30/2018  FINDINGS: Brain: No evidence of acute infarction, hemorrhage, hydrocephalus, extra-axial collection or mass lesion/mass effect. There is ventricular and sulcal enlargement reflecting mild generalized atrophy stable from the prior study. Minor periventricular white matter hypoattenuation is noted consistent with chronic microvascular ischemic change, also stable. Vascular: No hyperdense vessel or unexpected calcification. Skull: Normal. Negative for fracture or focal lesion. Sinuses/Orbits: In normal globes and orbits. Visualized sinuses and mastoid air cells are clear. Other: None. IMPRESSION: 1. No acute intracranial abnormalities. 2. Atrophy and minor chronic microvascular ischemic change. Stable appearance from the prior head CT. Electronically Signed   By: Amie Portland M.D.   On: 07/24/2018 11:34  EKG: Sinus brady at 59 bpm with normal axis and nonspecific ST-T wave changes.   Assessment/Plan  This is a 72 y.o. female with a history of anxiety, depression, EtOH abuse, HTN, HLD, GERD now being admitted with:  #. Anaphylactic reaction likely to Rocephin - Admit to observation, tele monitoring - Steroids, benadryl, Pepcid - Redose Epi if necessary  #. UTI - Cipro q12h - Follow up urine culture  #. Abdominal pain - Check abd XR 2 views  #. Hypokalemia / Hypogmag - Replace electrolytes  #. History of EtOH, last drink several days ago. - CIWA protocol with PRN Librium (does not do well with Ativan per daughter) - Aspiration and seizure precautions  #. History of generalized weakness - PT eval and treat  #. History of anxiety/depression/insomnia - Hold klonopin, Elavil, Remeron due to weakness, hypotension, confusion.   Admission status: Tele obs IV Fluids: NS with banana bag Diet/Nutrition: Heart healthy Consults called: PT  DVT Px: Lovenox, SCDs and early ambulation. Code Status: Full Code  Disposition Plan: To home in 1-2 days  All the records are reviewed and case  discussed with ED provider. Management plans discussed with the patient and/or family who express understanding and agree with plan of care.  Zackory Pudlo D.O. on 07/24/2018 at 7:04 PM CC: Primary care physician; Richmond Campbell., PA-C   07/24/2018, 7:04 PM

## 2018-07-24 NOTE — ED Notes (Signed)
Admitting Provider at bedside. 

## 2018-07-24 NOTE — ED Provider Notes (Signed)
4:50 PM-nursing reported to me that the patient was feeling weak after sitting in wheelchair to go home.  I asked him to check orthostatics.  She was placed back in bed and her supine systolic blood pressure was 48.  Orthostatic pressures were not done.  I arrived in the room found patient alert and communicative but stating to be "weak and dizzy."  Patient had just finished Rocephin IV given for UTI.  At this time there is erythema on the left medial upper arm, above the site of her IV Rocephin infusion.  There is similar redness of the right upper arm as well.  Patient hypotensive, with rash, likely having anaphylaxis.  Resuscitation begun with IV fluid bolus, epinephrine, Solu-Medrol, Pepcid, Benadryl.  Will reassess closely.  Clinical Course as of Jul 24 1844  Wynelle LinkSun Jul 24, 2018  1703 Normal  Magnesium [EW]  1703 Normal except potassium low, glucose high, calcium low, total protein low, albumin low  Comprehensive metabolic panel(!) [EW]  1703 Normal  Protime-INR [EW]  1703 Normal except presence of hemoglobin and ketones, nitrite, leukocytes, WBC, bacteria  Urinalysis, Routine w reflex microscopic(!) [EW]  1704 Normal  Ammonia [EW]  1705 Normal  Ethanol [EW]  1705 Normal except MCV high  CBC with Differential(!) [EW]  1706 Hyperinflation, no infiltrate or edema, images reviewed  DG Chest 2 View [EW]  1706 Atrophy present, no bleeding or mass lesion, images reviewed  CT Head Wo Contrast [EW]  1736 Blood pressure is now normal.  Patient states she cannot recall why she is here or what happened to her.  We will continue to observe and treat.  Patient encouraged to eat and drink.  Will attempt to give her potassium dose again.  Ensure ordered to encourage nutrition intake.   [EW]    Clinical Course User Index [EW] Mancel BaleWentz, Keslee Harrington, MD       Patient Vitals for the past 24 hrs:  BP Temp Temp src Pulse Resp SpO2 Height Weight  07/24/18 1815 110/71 - - 68 (!) 23 100 % - -  07/24/18 1812  107/66 - - 66 12 100 % - -  07/24/18 1809 109/71 - - 70 (!) 22 100 % - -  07/24/18 1806 114/88 - - 67 20 100 % - -  07/24/18 1803 112/69 - - 70 17 100 % - -  07/24/18 1800 110/72 - - 68 18 100 % - -  07/24/18 1757 111/61 - - 68 19 100 % - -  07/24/18 1754 107/64 - - 66 (!) 22 96 % - -  07/24/18 1751 111/69 - - 69 18 100 % - -  07/24/18 1745 110/62 - - 68 16 95 % - -  07/24/18 1742 (!) 146/79 - - 64 17 97 % - -  07/24/18 1739 (!) 113/57 - - 71 20 100 % - -  07/24/18 1736 109/68 - - 63 16 100 % - -  07/24/18 1733 (!) 110/59 - - 70 (!) 22 100 % - -  07/24/18 1730 110/70 - - 70 19 100 % - -  07/24/18 1727 123/67 - - 67 16 100 % - -  07/24/18 1724 125/76 - - 69 16 100 % - -  07/24/18 1721 128/72 - - 71 15 100 % - -  07/24/18 1718 135/76 - - 74 17 100 % - -  07/24/18 1715 100/69 - - 67 15 100 % - -  07/24/18 1712 93/70 - - 69 19 100 % - -  07/24/18 1709 107/64 - - 72 18 100 % - -  07/24/18 1703 (!) 90/56 - - 65 (!) 21 99 % - -  07/24/18 1700 (!) 68/48 - - 66 (!) 21 98 % - -  07/24/18 1658 (!) 62/44 - - 72 18 97 % - -  07/24/18 1657 (!) 59/45 - - 75 (!) 21 97 % - -  07/24/18 1655 (!) 54/43 - - 74 (!) 23 97 % - -  07/24/18 1652 (!) 53/41 - - 83 (!) 27 92 % - -  07/24/18 1648 (!) 58/41 - - - - - - -  07/24/18 1400 107/72 - - 60 16 100 % - -  07/24/18 1330 102/66 - - 63 20 99 % - -  07/24/18 1230 96/66 - - 64 20 98 % - -  07/24/18 1200 107/73 - - 63 20 98 % - -  07/24/18 1153 - (!) 97.4 F (36.3 C) Oral - - - - -  07/24/18 1145 106/69 - - (!) 59 17 97 % - -  07/24/18 1115 106/72 - - 65 18 98 % - -  07/24/18 1046 - - - - - - 5\' 6"  (1.676 m) 45.4 kg  07/24/18 1029 - - - - - 100 % - -    6:29 PM Reevaluation with update and discussion. After initial assessment and treatment, an updated evaluation reveals patient continues to be more alert but somewhat altered with poor recall for events today.  Blood pressure has normalized.  Prior evaluation noted.  Patient will require admission for  ongoing monitoring and management. Mancel Bale   Medical Decision Making: Anaphylaxis after receiving Rocephin.  Rocephin was given for UTI.  She will require additional treatment, which can be done tomorrow.  Patient will need to be admitted for observation because of mild altered mental status with anaphylaxis.  Doubt sepsis.  Doubt metabolic instability.  Doubt Warnicke's encephalopathy.  Doubt alcohol withdrawal syndrome at this time.  Patient will have to be monitored closely.  She is apparently an alcoholic.  CRITICAL CARE- Yes Performed by: Mancel Bale   .Critical Care Performed by: Mancel Bale, MD Authorized by: Mancel Bale, MD   Critical care provider statement:    Critical care time (minutes):  45   Critical care start time:  07/24/2018 5:00 PM   Critical care end time:  07/24/2018 6:46 PM   Critical care was necessary to treat or prevent imminent or life-threatening deterioration of the following conditions:  Circulatory failure   Critical care was time spent personally by me on the following activities:  Discussions with consultants, evaluation of patient's response to treatment, examination of patient, ordering and performing treatments and interventions, ordering and review of laboratory studies, ordering and review of radiographic studies, pulse oximetry, re-evaluation of patient's condition, obtaining history from patient or surrogate and review of old charts     Nursing Notes Reviewed/ Care Coordinated Applicable Imaging Reviewed Interpretation of Laboratory Data incorporated into ED treatment   6:34 PM-Consult complete with hospitalist. Patient case explained and discussed. She agrees to admit patient for further evaluation and treatment. Call ended at 6:42 PM  Plan: Tomasita Morrow, MD 07/24/18 [phone]

## 2018-07-24 NOTE — Progress Notes (Signed)
Pharmacy Antibiotic Note  April PoissonDeborah S Gossett is a 72 y.o. female admitted on 07/24/2018 with UTI.  Pharmacy has been consulted for cipro dosing.  Plan: Cipro 400 mg IV q12h F/u scr/cultures  Height: 5\' 6"  (167.6 cm) Weight: 100 lb (45.4 kg) IBW/kg (Calculated) : 59.3  Temp (24hrs), Avg:97.3 F (36.3 C), Min:97.1 F (36.2 C), Max:97.4 F (36.3 C)  Recent Labs  Lab 07/24/18 1112  WBC 7.6  CREATININE 0.69    Estimated Creatinine Clearance: 46.9 mL/min (by C-G formula based on SCr of 0.69 mg/dL).    Allergies  Allergen Reactions  . Naltrexone Shortness Of Breath  . Rocephin [Ceftriaxone Sodium In Dextrose] Anaphylaxis  . Penicillins Rash    Has patient had a PCN reaction causing immediate rash, facial/tongue/throat swelling, SOB or lightheadedness with hypotension: Yes immediate rash  Has patient had a PCN reaction causing severe rash involving mucus membranes or skin necrosis: Yes Has patient had a PCN reaction that required hospitalization": No Has patient had a PCN reaction occurring within the last 10 years: Yes If all of the above answers are "NO", then may proceed with Cephalosporin use.     Antimicrobials this admission: 8/11 rocephin >> x1 ED allergicrxn 8/11 cipro >>   Dose adjustments this admission:   Microbiology results:  BCx:   UCx:    Sputum:    MRSA PCR:  Thank you for allowing pharmacy to be a part of this patient's care.  Lorenza EvangelistGreen, Mahi Zabriskie R 07/24/2018 9:19 PM

## 2018-07-24 NOTE — ED Triage Notes (Addendum)
Pt from home via EMS c/o weakness x1 week. Pt daughter reports that pt has not been eating well for approx 1 month but for the past week almost nothing to eat. Pt denies pain. Pt received 700 cc NS en route. Pt is A&O and in NAD. Pt placed on 2L d/t O2 being 86% on RA. Pt has been a lifelong smoker per EMS

## 2018-07-24 NOTE — ED Notes (Signed)
Patient transported to CT 

## 2018-07-24 NOTE — ED Notes (Signed)
Bed: [id number] Expected date: 07/24/18 Expected time: 10:12 AM Means of arrival: Ambulance Comments: gen weakness

## 2018-07-24 NOTE — ED Notes (Signed)
Attempting to attain urine sample now.

## 2018-07-24 NOTE — ED Notes (Signed)
Spoke to ED Provider Effie ShyWentz about pt. Pt has become weaker. Will conduct Ortho Vitals, and attempt to ambulate patient.

## 2018-07-24 NOTE — ED Notes (Signed)
Pt was ambulated with 2 nurses. Pt does have balance issue when ambulating. Pt can not ambulate without assistance.

## 2018-07-24 NOTE — ED Provider Notes (Addendum)
[facility]-EMERGENCY DEPT Provider Note   CSN: [id number] Arrival date & time: 07/24/18  1013     History   Chief Complaint Chief Complaint  Patient presents with  . Weakness    HPI April [name] is a 72 y.o. female.  bur  The history is provided by the EMS personnel and the patient. No language interpreter was used.  Weakness    April [name] is a 72 y.o. female who presents to the Emergency Department complaining of weakness.  Level V caveat due to AMS. History is provided by EMS and the patient. Per EMS the daughter states that the patient has not been eating for the last several weeks and becoming progressively week. Patient is unsure why she is in the emergency department. She does endorse poor appetite. She states that she had difficulty breathing a few days ago and it was severe but now she only has mild shortness of breath. She reports headaches for an unclear period of time. She denies any fevers, chest pain, cough, abdominal pain, vomiting. She does smoke pack per day and she drinks 2 to 3 ounces of brandy daily. Her last drink was yesterday morning. Past Medical History:  Diagnosis Date  . Anxiety   . Current smoker   . Depression   . ETOH abuse   . GERD (gastroesophageal reflux disease)   . History of shingles summer 2015  . HTN (hypertension)   . Hyperlipidemia   . Hypertension   . Leg edema   . Neuropathy   . Neuropathy   . Pancreatitis     Patient Active Problem List   Diagnosis Date Noted  . FTT (failure to thrive) in adult 05/18/2018  . Falls 05/18/2018  . Pancreatitis 10/05/2017  . Frequent falls 02/11/2017  . Protein-calorie malnutrition, severe 02/05/2017  . Gallstone pancreatitis 01/30/2017  . Hypokalemia 01/30/2017  . Macrocytic anemia 01/30/2017  . Metabolic acidosis 01/30/2017  . Neuropathy, alcoholic (HCC) 09/08/2016  . Neuropathy 12/11/2015  . Alcohol abuse 12/11/2015  . Tobacco abuse 12/11/2015  . Chronic  venous insufficiency 09/13/2015  . Extremity numbness 09/13/2015    Past Surgical History:  Procedure Laterality Date  . ERCP N/A 02/01/2017   Procedure: ENDOSCOPIC RETROGRADE CHOLANGIOPANCREATOGRAPHY (ERCP);  Surgeon: Dorena CookeyJohn Hayes, MD;  Location: [facility];  Service: Endoscopy;  Laterality: N/A;  . LAPAROSCOPIC CHOLECYSTECTOMY WITH COMMON DUCT EXPLORATION AND INTRAOP CHOLANGIOGRAM N/A 02/03/2017   Procedure: LAPAROSCOPIC CHOLECYSTECTOMY WITH POSSIBLE COMMON DUCT EXPLORATION AND INTRAOP CHOLANGIOGRAM;  Surgeon: Ovidio Kinavid Newman, MD;  Location: [facility];  Service: General;  Laterality: N/A;  . removal of uterine fibroids  2004     OB History   None      Home Medications    Prior to Admission medications   Medication Sig Start Date End Date Taking? Authorizing Provider  amitriptyline (ELAVIL) 25 MG tablet Take 25 mg by mouth at bedtime as needed for sleep.  08/27/16  Yes [provider]  clonazePAM (KLONOPIN) 0.5 MG tablet Take 0.5 mg by mouth 2 (two) times daily as needed for anxiety. as directed 08/05/17  Yes [provider]  mirtazapine (REMERON) 7.5 MG tablet Take 7.5 mg by mouth at bedtime. 04/14/18  Yes [provider]  Multiple Vitamin (MULTIVITAMIN WITH MINERALS) TABS tablet Take 1 tablet by mouth daily. 04/13/17  Yes Penny PiaVega, [place], MD  potassium chloride (K-DUR,KLOR-CON) 10 MEQ tablet Take 10 mEq by mouth 2 (two) times daily.   Yes [provider]  cephALEXin (KEFLEX) 500  MG capsule Take 1 capsule (500 mg total) by mouth 2 (two) times daily. 07/24/18   Tilden Fossa, MD  folic acid (FOLVITE) 1 MG tablet Take 1 tablet (1 mg total) by mouth daily. 07/24/18   Tilden Fossa, MD  ondansetron (ZOFRAN ODT) 4 MG disintegrating tablet Take 1 tablet (4 mg total) by mouth every 8 (eight) hours as needed for nausea or vomiting. 07/24/18   Tilden Fossa, MD  potassium chloride (K-DUR) 10 MEQ tablet Take 1 tablet (10 mEq total) by mouth daily. 07/24/18   Tilden Fossa, MD  thiamine (VITAMIN B-1) 100 MG tablet Take 1 tablet (100 mg total) by mouth daily. 07/24/18   Tilden Fossa, MD    Family History Family History  Problem Relation Age of Onset  . Heart disease Father   . Neuropathy Neg Hx     Social History Social History   Tobacco Use  . Smoking status: Current Every Day Smoker    Packs/day: 1.00    Types: Cigarettes  . Smokeless tobacco: Never Used  Substance Use Topics  . Alcohol use: Yes    Alcohol/week: 14.0 standard drinks    Types: 14 Shots of liquor per week    Comment: states., "I keep a glass with me", reports drinking today (04/09/17)  . Drug use: No     Allergies   Naltrexone and Penicillins   Review of Systems Review of Systems  Neurological: Positive for weakness.  All other systems reviewed and are negative.    Physical Exam Updated Vital Signs BP 107/72   Pulse 60   Temp (!) 97.4 F (36.3 C) (Oral)   Resp 16   Ht 5\' 6"  (1.676 m)   Wt 45.4 kg   SpO2 100%   BMI 16.14 kg/m   Physical Exam  Constitutional: She is oriented to person, place, and time. She appears well-developed and well-nourished.  HENT:  Head: Normocephalic and atraumatic.  Cardiovascular: Normal rate and regular rhythm.  No murmur heard. Pulmonary/Chest: Effort normal and breath sounds normal. No respiratory distress.  Abdominal: Soft. There is no tenderness. There is no rebound and no guarding.  Musculoskeletal: She exhibits no edema or tenderness.  Neurological: She is alert and oriented to person, place, and time.  Generalized weakness.  Mildly confused, slow to answer questions.    Skin: Skin is warm and dry.  Psychiatric: She has a normal mood and affect. Her behavior is normal.  Nursing note and vitals reviewed.    ED Treatments / Results  Labs (all labs ordered are listed, but only abnormal results are displayed) Labs Reviewed  COMPREHENSIVE METABOLIC PANEL - Abnormal; Notable for the following components:       Result Value   Potassium 2.9 (*)    Glucose, Bld 114 (*)    Calcium 8.4 (*)    Total Protein 5.9 (*)    Albumin 3.1 (*)    All other components within normal limits  CBC WITH DIFFERENTIAL/PLATELET - Abnormal; Notable for the following components:   RBC 3.62 (*)    MCV 101.9 (*)    MCH 35.4 (*)    All other components within normal limits  URINALYSIS, ROUTINE W REFLEX MICROSCOPIC - Abnormal; Notable for the following components:   Hgb urine dipstick SMALL (*)    Ketones, ur 5 (*)    Nitrite POSITIVE (*)    Leukocytes, UA LARGE (*)    WBC, UA >50 (*)    Bacteria, UA MANY (*)    All other  components within normal limits  URINE CULTURE  ETHANOL  AMMONIA  PROTIME-INR  MAGNESIUM  I-STAT TROPONIN, ED    EKG EKG Interpretation  Date/Time:  Sunday July 24 2018 11:12:24 EDT Ventricular Rate:  59 PR Interval:    QRS Duration: 89 QT Interval:  445 QTC Calculation: 441 R Axis:   -75 Text Interpretation:  Sinus rhythm Borderline short PR interval Left anterior fascicular block Anterior infarct, old Confirmed by Tilden Fossa [phone]) on 07/24/2018 12:01:35 PM   Radiology Dg Chest 2 View  Result Date: 07/24/2018 CLINICAL DATA:  Weakness x1 week EXAM: CHEST - 2 VIEW COMPARISON:  CT chest dated 04/30/2018 FINDINGS: Lungs are clear.  No pleural effusion or pneumothorax. The heart is normal in size. Visualized osseous structures are within normal limits. IMPRESSION: Normal chest radiographs. Electronically Signed   By: Charline Bills M.D.   On: 07/24/2018 11:49   Ct Head Wo Contrast  Result Date: 07/24/2018 CLINICAL DATA:  Pt from home via EMS c/o weakness x1 week. Pt daughter reports that pt has not been eating well for approx 1 month but for the past week almost nothing to eat. Pt denies pain. Pt received 700 cc NS en route. Pt is A&O and in NAD. Pt placed on 2L d/t O2 being 86% on RA. Pt has been a lifelong smoker per EMS EXAM: CT HEAD WITHOUT CONTRAST TECHNIQUE: Contiguous axial  images were obtained from the base of the skull through the vertex without intravenous contrast. COMPARISON:  04/30/2018 FINDINGS: Brain: No evidence of acute infarction, hemorrhage, hydrocephalus, extra-axial collection or mass lesion/mass effect. There is ventricular and sulcal enlargement reflecting mild generalized atrophy stable from the prior study. Minor periventricular white matter hypoattenuation is noted consistent with chronic microvascular ischemic change, also stable. Vascular: No hyperdense vessel or unexpected calcification. Skull: Normal. Negative for fracture or focal lesion. Sinuses/Orbits: In normal globes and orbits. Visualized sinuses and mastoid air cells are clear. Other: None. IMPRESSION: 1. No acute intracranial abnormalities. 2. Atrophy and minor chronic microvascular ischemic change. Stable appearance from the prior head CT. Electronically Signed   By: Amie Portland M.D.   On: 07/24/2018 11:34    Procedures Procedures (including critical care time)  Medications Ordered in ED Medications  potassium chloride SA (K-DUR,KLOR-CON) CR tablet 40 mEq (40 mEq Oral Refused 07/24/18 1341)  cefTRIAXone (ROCEPHIN) 1 g in sodium chloride 0.9 % 100 mL IVPB (1 g Intravenous New Bag/Given 07/24/18 1602)  clonazepam (KLONOPIN) disintegrating tablet 0.5 mg (0.5 mg Oral Given 07/24/18 1602)  thiamine (B-1) injection 100 mg (100 mg Intravenous Given 07/24/18 1146)  sodium chloride 0.9 % bolus 500 mL (0 mLs Intravenous Stopped 07/24/18 1543)  albuterol (PROVENTIL) (2.5 MG/3ML) 0.083% nebulizer solution 5 mg (5 mg Nebulization Given 07/24/18 1359)  ondansetron (ZOFRAN) injection 4 mg (4 mg Intravenous Given 07/24/18 1602)     Initial Impression / Assessment and Plan / ED Course  I have reviewed the triage vital signs and the nursing notes.  Pertinent labs & imaging results that were available during my care of the patient were reviewed by me and considered in my medical decision making (see chart  for details).  Clinical Course as of Jul 25 802  Wynelle Link Jul 24, 2018  1703 Normal  Magnesium [EW]  1703 Normal except potassium low, glucose high, calcium low, total protein low, albumin low  Comprehensive metabolic panel(!) [EW]  1703 Normal  Protime-INR [EW]  1703 Normal except presence of hemoglobin and ketones, nitrite, leukocytes, WBC, bacteria  Urinalysis, Routine w reflex microscopic(!) [EW]  1704 Normal  Ammonia [EW]  1705 Normal  Ethanol [EW]  1705 Normal except MCV high  CBC with Differential(!) [EW]  1706 Hyperinflation, no infiltrate or edema, images reviewed  DG Chest 2 View [EW]  1706 Atrophy present, no bleeding or mass lesion, images reviewed  CT Head Wo Contrast [EW]  1736 Blood pressure is now normal.  Patient states she cannot recall why she is here or what happened to her.  We will continue to observe and treat.  Patient encouraged to eat and drink.  Will attempt to give her potassium dose again.  Ensure ordered to encourage nutrition intake.   [EW]    Clinical Course User Index [EW] Mancel Bale, MD   patient here for evaluation of weakness, decreased appetite and failure to thrive. She does have a history of alcohol abuse but does not appear to be in active withdrawals. Labs demonstrate hypokalemia, UA is concerning for UTI. She has significant difficulty on ambulation in the emergency department and requires assistance. Recommend admission for IV antibiotics and observation and patient refuses. Family state that the patient is in her baseline mental status. Plan to discharge home with oral antibiotics, multivitamin and antiemetics. Home health requested. Discussed importance of close outpatient follow-up as well as return precautions.  Final Clinical Impressions(s) / ED Diagnoses   Final diagnoses:  Weakness  Hypokalemia  Acute UTI    ED Discharge Orders         Ordered    Home Health     07/24/18 1449    Face-to-face encounter (required for  Medicare/Medicaid patients)    Comments:  I Tilden Fossa certify that this patient is under my care and that I, or a nurse practitioner or physician's assistant working with me, had a face-to-face encounter that meets the physician face-to-face encounter requirements with this patient on 07/24/2018. The encounter with the patient was in whole, or in part for the following medical condition(s) which is the primary reason for home health care (List medical condition): patient in the ED for weakness, decreased oral intake and difficulty with walking. She is deconditioned with poor nutrition.   07/24/18 1449    cephALEXin (KEFLEX) 500 MG capsule  2 times daily     07/24/18 1545    ondansetron (ZOFRAN ODT) 4 MG disintegrating tablet  Every 8 hours PRN     07/24/18 1545    thiamine (VITAMIN B-1) 100 MG tablet  Daily     07/24/18 1545    folic acid (FOLVITE) 1 MG tablet  Daily     07/24/18 1547    potassium chloride (K-DUR) 10 MEQ tablet  Daily     07/24/18 1550           Tilden Fossa, MD 07/24/18 1615    Tilden Fossa, MD 07/25/18 [phone]

## 2018-07-24 NOTE — ED Notes (Signed)
Pt denies having and ETOH today.

## 2018-07-24 NOTE — ED Notes (Signed)
ED TO INPATIENT HANDOFF REPORT  Name/Age/Gender April [name] 72 y.o. female  Code Status Code Status History    Date Active Date Inactive Code Status Order ID Comments User Context   10/05/2017 0158 10/08/2017 1916 Full Code [id number]  Norval Morton, MD ED   04/09/2017 2020 04/13/2017 1904 Full Code [id number]  Lily Kocher, MD ED   02/11/2017 0851 02/12/2017 1705 Full Code [id number]  Elwin Mocha, MD ED   01/30/2017 0305 02/06/2017 2122 Full Code [id number]  Danford, Suann Larry, MD Inpatient      Home/SNF/Other Home  Chief Complaint gen weakness  Level of Care/Admitting Diagnosis ED Disposition    ED Disposition Condition Comment   [facility] [facility]: [facility] [[id number]]  Level of Care: Telemetry [5]  Admit to tele based on following criteria: Other see comments  Comments: Hypotension 2/2 anaphylaxis  Diagnosis: Anaphylactic reaction [465681]  Admitting Physician: Harvie Bridge [[id number]]  Attending Physician: Harvie Bridge [[id number]]  PT Class (Do Not Modify): Observation [104]  PT Acc Code (Do Not Modify): Observation [10022]       Medical History Past Medical History:  Diagnosis Date  . Anxiety   . Current smoker   . Depression   . ETOH abuse   . GERD (gastroesophageal reflux disease)   . History of shingles summer 2015  . HTN (hypertension)   . Hyperlipidemia   . Hypertension   . Leg edema   . Neuropathy   . Neuropathy   . Pancreatitis     Allergies Allergies  Allergen Reactions  . Naltrexone Shortness Of Breath  . Rocephin [Ceftriaxone Sodium In Dextrose] Anaphylaxis  . Penicillins Rash    Has patient had a PCN reaction causing immediate rash, facial/tongue/throat swelling, SOB or lightheadedness with hypotension: Yes immediate rash  Has patient had a PCN reaction causing severe rash involving mucus membranes or skin necrosis: Yes Has patient had a PCN reaction that required hospitalization": No Has patient had  a PCN reaction occurring within the last 10 years: Yes If all of the above answers are "NO", then may proceed with Cephalosporin use.     IV Location/Drains/Wounds Patient Lines/Drains/Airways Status   Active Line/Drains/Airways    Name:   Placement date:   Placement time:   Site:   Days:   Peripheral IV 07/24/18 Left Antecubital   07/24/18    1049    Antecubital   less than 1   Peripheral IV 07/24/18 Left;Distal Forearm   07/24/18    1113    Forearm   less than 1          Labs/Imaging Results for orders placed or performed during the hospital encounter of 07/24/18 (from the past 48 hour(s))  Urinalysis, Routine w reflex microscopic     Status: Abnormal   Collection Time: 07/24/18 10:50 AM  Result Value Ref Range   Color, Urine YELLOW YELLOW   APPearance CLEAR CLEAR   Specific Gravity, Urine 1.014 1.005 - 1.030   pH 5.0 5.0 - 8.0   Glucose, UA NEGATIVE NEGATIVE mg/dL   Hgb urine dipstick SMALL (A) NEGATIVE   Bilirubin Urine NEGATIVE NEGATIVE   Ketones, ur 5 (A) NEGATIVE mg/dL   Protein, ur NEGATIVE NEGATIVE mg/dL   Nitrite POSITIVE (A) NEGATIVE   Leukocytes, UA LARGE (A) NEGATIVE   RBC / HPF 6-10 0 - 5 RBC/hpf   WBC, UA >50 (H) 0 - 5 WBC/hpf   Bacteria, UA MANY (A) NONE SEEN   Squamous Epithelial /  LPF 0-5 0 - 5   Mucus PRESENT    Hyaline Casts, UA PRESENT     Comment: Performed at [facility], [place] [address] [place], [place] [address]  Comprehensive metabolic panel     Status: Abnormal   Collection Time: 07/24/18 11:12 AM  Result Value Ref Range   Sodium 142 135 - 145 mmol/L   Potassium 2.9 (L) 3.5 - 5.1 mmol/L   Chloride 111 98 - 111 mmol/L   CO2 22 22 - 32 mmol/L   Glucose, Bld 114 (H) 70 - 99 mg/dL   BUN 22 8 - 23 mg/dL   Creatinine, Ser 0.69 0.44 - 1.00 mg/dL   Calcium 8.4 (L) 8.9 - 10.3 mg/dL   Total Protein 5.9 (L) 6.5 - 8.1 g/dL   Albumin 3.1 (L) 3.5 - 5.0 g/dL   AST 15 15 - 41 U/L   ALT 8 0 - 44 U/L   Alkaline Phosphatase 93 38 - 126  U/L   Total Bilirubin 0.5 0.3 - 1.2 mg/dL   GFR calc non Af Amer >60 >60 mL/min   GFR calc Af Amer >60 >60 mL/min    Comment: (NOTE) The eGFR has been calculated using the CKD EPI equation. This calculation has not been validated in all clinical situations. eGFR's persistently <60 mL/min signify possible Chronic Kidney Disease.    Anion gap 9 5 - 15    Comment: Performed at [facility], [place] [address] [place], [place] [address]  CBC with Differential     Status: Abnormal   Collection Time: 07/24/18 11:12 AM  Result Value Ref Range   WBC 7.6 4.0 - 10.5 K/uL   RBC 3.62 (L) 3.87 - 5.11 MIL/uL   Hemoglobin 12.8 12.0 - 15.0 g/dL   HCT 36.9 36.0 - 46.0 %   MCV 101.9 (H) 78.0 - 100.0 fL   MCH 35.4 (H) 26.0 - 34.0 pg   MCHC 34.7 30.0 - 36.0 g/dL   RDW 14.6 11.5 - 15.5 %   Platelets 382 150 - 400 K/uL   Neutrophils Relative % 57 %   Neutro Abs 4.3 1.7 - 7.7 K/uL   Lymphocytes Relative 33 %   Lymphs Abs 2.5 0.7 - 4.0 K/uL   Monocytes Relative 8 %   Monocytes Absolute 0.6 0.1 - 1.0 K/uL   Eosinophils Relative 2 %   Eosinophils Absolute 0.1 0.0 - 0.7 K/uL   Basophils Relative 0 %   Basophils Absolute 0.0 0.0 - 0.1 K/uL    Comment: Performed at [facility], [place] [address], [place], [place] [address]  Ethanol     Status: None   Collection Time: 07/24/18 11:12 AM  Result Value Ref Range   Alcohol, Ethyl (B) <10 <10 mg/dL    Comment: (NOTE) Lowest detectable limit for serum alcohol is 10 mg/dL. For medical purposes only. Performed at [facility], [place] [address], [place], [place] [address]   Ammonia     Status: None   Collection Time: 07/24/18 11:12 AM  Result Value Ref Range   Ammonia 15 9 - 35 umol/L    Comment: Performed at [facility], [place] [address], [place], [place] [address]  Protime-INR     Status: None   Collection Time: 07/24/18 11:12 AM  Result Value Ref Range   Prothrombin Time 13.1 11.4 -  15.2 seconds   INR 1.00     Comment: Performed at [facility], [place]  [address] [place], [place] [address]  I-stat troponin, ED     Status: None   Collection Time: 07/24/18 11:30 AM  Result Value Ref Range   Troponin i, poc 0.00 0.00 - 0.08 ng/mL   Comment 3            Comment: Due to the release kinetics of cTnI, a negative result within the first hours of the onset of symptoms does not rule out myocardial infarction with certainty. If myocardial infarction is still suspected, repeat the test at appropriate intervals.   Magnesium     Status: None   Collection Time: 07/24/18 12:52 PM  Result Value Ref Range   Magnesium 1.7 1.7 - 2.4 mg/dL    Comment: Performed at [facility], [place] [address] [place], [place] [address]   Dg Chest 2 View  Result Date: 07/24/2018 CLINICAL DATA:  Weakness x1 week EXAM: CHEST - 2 VIEW COMPARISON:  CT chest dated 04/30/2018 FINDINGS: Lungs are clear.  No pleural effusion or pneumothorax. The heart is normal in size. Visualized osseous structures are within normal limits. IMPRESSION: Normal chest radiographs. Electronically Signed   By: Julian Hy M.D.   On: 07/24/2018 11:49   Ct Head Wo Contrast  Result Date: 07/24/2018 CLINICAL DATA:  Pt from home via EMS c/o weakness x1 week. Pt daughter reports that pt has not been eating well for approx 1 month but for the past week almost nothing to eat. Pt denies pain. Pt received 700 cc NS en route. Pt is A&O and in NAD. Pt placed on 2L d/t O2 being 86% on RA. Pt has been a lifelong smoker per EMS EXAM: CT HEAD WITHOUT CONTRAST TECHNIQUE: Contiguous axial images were obtained from the base of the skull through the vertex without intravenous contrast. COMPARISON:  04/30/2018 FINDINGS: Brain: No evidence of acute infarction, hemorrhage, hydrocephalus, extra-axial collection or mass lesion/mass effect. There is ventricular and sulcal enlargement reflecting mild generalized  atrophy stable from the prior study. Minor periventricular white matter hypoattenuation is noted consistent with chronic microvascular ischemic change, also stable. Vascular: No hyperdense vessel or unexpected calcification. Skull: Normal. Negative for fracture or focal lesion. Sinuses/Orbits: In normal globes and orbits. Visualized sinuses and mastoid air cells are clear. Other: None. IMPRESSION: 1. No acute intracranial abnormalities. 2. Atrophy and minor chronic microvascular ischemic change. Stable appearance from the prior head CT. Electronically Signed   By: Lajean Manes M.D.   On: 07/24/2018 11:34    Pending Labs Unresulted Labs (From admission, onward)    Start     Ordered   07/24/18 1050  Urine culture  STAT,   STAT     07/24/18 1050   Signed and Held  CBC  (enoxaparin (LOVENOX)    CrCl >/= 30 ml/min)  Once,   R    Comments:  Baseline for enoxaparin therapy IF NOT ALREADY DRAWN.  Notify MD if PLT < 100 K.    Signed and Held   Signed and Held  Creatinine, serum  (enoxaparin (LOVENOX)    CrCl >/= 30 ml/min)  Once,   R    Comments:  Baseline for enoxaparin therapy IF NOT ALREADY DRAWN.    Signed and Held   Signed and Held  Creatinine, serum  (enoxaparin (LOVENOX)    CrCl >/= 30 ml/min)  Weekly,   R    Comments:  while on enoxaparin therapy    Signed and Held   Signed and Held  Basic Building surveyor  morning,   R     Signed and Held   Signed and Held  CBC  Tomorrow morning,   R     Signed and Held          Vitals/Pain Today's Vitals   07/24/18 1836 07/24/18 1839 07/24/18 1842 07/24/18 1845  BP: 106/76 99/65 105/62 112/63  Pulse: 68 63 66 65  Resp: 17 (!) 22 (!) 21 14  Temp:      TempSrc:      SpO2: 100% 100% 100% 100%  Weight:      Height:      PainSc:        Isolation Precautions No active isolations  Medications Medications  potassium chloride SA (K-DUR,KLOR-CON) CR tablet 40 mEq (40 mEq Oral Refused 07/24/18 1341)  clonazepam (KLONOPIN) disintegrating  tablet 0.5 mg (0.5 mg Oral Given 07/24/18 1602)  EPINEPHrine (EPI-PEN) 0.3 mg/0.3 mL injection (  Canceled Entry 07/24/18 1701)  potassium chloride (KLOR-CON) packet 40 mEq (has no administration in time range)  feeding supplement (ENSURE ENLIVE) (ENSURE ENLIVE) liquid 237 mL (has no administration in time range)  EPINEPHrine (EPI-PEN) 0.3 mg/0.3 mL injection (has no administration in time range)  thiamine (B-1) injection 100 mg (100 mg Intravenous Given 07/24/18 1146)  sodium chloride 0.9 % bolus 500 mL (0 mLs Intravenous Stopped 07/24/18 1543)  albuterol (PROVENTIL) (2.5 MG/3ML) 0.083% nebulizer solution 5 mg (5 mg Nebulization Given 07/24/18 1359)  cefTRIAXone (ROCEPHIN) 1 g in sodium chloride 0.9 % 100 mL IVPB (0 g Intravenous Stopped 07/24/18 1640)  ondansetron (ZOFRAN) injection 4 mg (4 mg Intravenous Given 07/24/18 1602)  EPINEPHrine (EPI-PEN) injection 0.3 mg (0.3 mg Intramuscular Given 07/24/18 1700)  methylPREDNISolone sodium succinate (SOLU-MEDROL) 125 mg/2 mL injection 125 mg (125 mg Intravenous Given 07/24/18 1710)  diphenhydrAMINE (BENADRYL) injection 25 mg (25 mg Intravenous Given 07/24/18 1710)  famotidine (PEPCID) IVPB 20 mg premix (0 mg Intravenous Stopped 07/24/18 1750)  0.9 %  sodium chloride infusion ( Intravenous New Bag/Given 07/24/18 1700)  0.9 %  sodium chloride infusion ( Intravenous New Bag/Given 07/24/18 1700)    Mobility Patient increasingly weak, high fall risk due to AMS

## 2018-07-24 NOTE — ED Notes (Signed)
After receiving medication, patient became very lethargic. NT attempted to attain Ortho's and vitals were unstable to perform sitting to standing. RN noticed reddening of chest, arms and legs. ED provider was called for and Charge RN was called for. Pt stable now, still lethargic but similar to what patient presented as upon initial assessment earlier this morning. Pt is having difficulty recalling year and month but knows location and who she is. RN will call pharmacy to verify on pt chart that pt is allergic to Rocephin.

## 2018-07-24 NOTE — ED Notes (Signed)
RN called pharmacy. 1/2 life for Rocephin is 5-9 hours. Pharmacy advised RN to continue to monitor patient for signs and symptoms of anaphylaxis due to Epi Pen effects lessening. RN will keep Epi Pen at bedside while patient is in ED.

## 2018-07-24 NOTE — Progress Notes (Signed)
Rx Brief note:Lovenox    Rx adjusted Lovenox to 30 mg daily in pt with wt = 45 kg  Thanks Lorenza EvangelistGreen, Elisandro Jarrett R 07/24/2018 9:16 PM

## 2018-07-24 NOTE — Care Management Note (Signed)
Case Management Note  Patient Details  Name: April PoissonDeborah S [name] MRN: [id number] Date of Birth: 06/26/1946  Subjective/Objective:   ETOH                 Action/Plan: NCM spoke to pt and dtr, Bobbie at bedside. [facility] had [facility] in the past. Dtr lives in home with pt to assist as needed. She has RW, and bedside commode at home. [facility]    Expected Discharge Date:  07/24/2018             Expected Discharge Plan:  Home w Home Health Services  In-House Referral:  NA  Discharge planning Services  CM Consult  Post Acute Care Choice:  Home Health Choice offered to:  Adult Children  DME Arranged:  N/A DME Agency:  NA  HH Arranged:  RN, PT HH Agency:  [facility]  Status of Service:  Completed, signed off  If discussed at Long Length of Stay Meetings, dates discussed:    Additional Comments:  Elliot CousinShavis, Damany Eastman Ellen, RN 07/24/2018, 2:42 PM

## 2018-07-25 ENCOUNTER — Observation Stay (HOSPITAL_COMMUNITY): Payer: PPO

## 2018-07-25 DIAGNOSIS — K5900 Constipation, unspecified: Secondary | ICD-10-CM | POA: Diagnosis present

## 2018-07-25 DIAGNOSIS — R627 Adult failure to thrive: Secondary | ICD-10-CM | POA: Diagnosis present

## 2018-07-25 DIAGNOSIS — T782XXA Anaphylactic shock, unspecified, initial encounter: Secondary | ICD-10-CM | POA: Diagnosis not present

## 2018-07-25 DIAGNOSIS — R1312 Dysphagia, oropharyngeal phase: Secondary | ICD-10-CM | POA: Diagnosis present

## 2018-07-25 DIAGNOSIS — K219 Gastro-esophageal reflux disease without esophagitis: Secondary | ICD-10-CM | POA: Diagnosis present

## 2018-07-25 DIAGNOSIS — I472 Ventricular tachycardia: Secondary | ICD-10-CM | POA: Diagnosis present

## 2018-07-25 DIAGNOSIS — Z881 Allergy status to other antibiotic agents status: Secondary | ICD-10-CM | POA: Diagnosis not present

## 2018-07-25 DIAGNOSIS — E785 Hyperlipidemia, unspecified: Secondary | ICD-10-CM | POA: Diagnosis present

## 2018-07-25 DIAGNOSIS — Z681 Body mass index (BMI) 19 or less, adult: Secondary | ICD-10-CM | POA: Diagnosis not present

## 2018-07-25 DIAGNOSIS — T886XXA Anaphylactic reaction due to adverse effect of correct drug or medicament properly administered, initial encounter: Secondary | ICD-10-CM | POA: Diagnosis present

## 2018-07-25 DIAGNOSIS — Z880 Allergy status to penicillin: Secondary | ICD-10-CM | POA: Diagnosis not present

## 2018-07-25 DIAGNOSIS — E876 Hypokalemia: Secondary | ICD-10-CM | POA: Diagnosis present

## 2018-07-25 DIAGNOSIS — E43 Unspecified severe protein-calorie malnutrition: Secondary | ICD-10-CM | POA: Diagnosis present

## 2018-07-25 DIAGNOSIS — F17210 Nicotine dependence, cigarettes, uncomplicated: Secondary | ICD-10-CM | POA: Diagnosis present

## 2018-07-25 DIAGNOSIS — I959 Hypotension, unspecified: Secondary | ICD-10-CM | POA: Diagnosis present

## 2018-07-25 DIAGNOSIS — G629 Polyneuropathy, unspecified: Secondary | ICD-10-CM | POA: Diagnosis present

## 2018-07-25 DIAGNOSIS — F329 Major depressive disorder, single episode, unspecified: Secondary | ICD-10-CM | POA: Diagnosis present

## 2018-07-25 DIAGNOSIS — N390 Urinary tract infection, site not specified: Secondary | ICD-10-CM

## 2018-07-25 DIAGNOSIS — G4700 Insomnia, unspecified: Secondary | ICD-10-CM | POA: Diagnosis present

## 2018-07-25 DIAGNOSIS — A498 Other bacterial infections of unspecified site: Secondary | ICD-10-CM

## 2018-07-25 DIAGNOSIS — F1010 Alcohol abuse, uncomplicated: Secondary | ICD-10-CM

## 2018-07-25 DIAGNOSIS — F419 Anxiety disorder, unspecified: Secondary | ICD-10-CM | POA: Diagnosis present

## 2018-07-25 DIAGNOSIS — T361X5A Adverse effect of cephalosporins and other beta-lactam antibiotics, initial encounter: Secondary | ICD-10-CM | POA: Diagnosis present

## 2018-07-25 DIAGNOSIS — R21 Rash and other nonspecific skin eruption: Secondary | ICD-10-CM | POA: Diagnosis present

## 2018-07-25 DIAGNOSIS — I10 Essential (primary) hypertension: Secondary | ICD-10-CM | POA: Diagnosis present

## 2018-07-25 DIAGNOSIS — B9620 Unspecified Escherichia coli [E. coli] as the cause of diseases classified elsewhere: Secondary | ICD-10-CM

## 2018-07-25 LAB — BASIC METABOLIC PANEL
Anion gap: 6 (ref 5–15)
BUN: 14 mg/dL (ref 8–23)
CALCIUM: 7.9 mg/dL — AB (ref 8.9–10.3)
CHLORIDE: 113 mmol/L — AB (ref 98–111)
CO2: 21 mmol/L — ABNORMAL LOW (ref 22–32)
Creatinine, Ser: 0.41 mg/dL — ABNORMAL LOW (ref 0.44–1.00)
GFR calc non Af Amer: >60 mL/min (ref >60)
Glucose, Bld: 202 mg/dL — ABNORMAL HIGH (ref 70–99)
Potassium: 3.4 mmol/L — ABNORMAL LOW (ref 3.5–5.1)
SODIUM: 140 mmol/L (ref 135–145)

## 2018-07-25 LAB — CBC
HCT: 35.3 % — ABNORMAL LOW (ref 36.0–46.0)
Hemoglobin: 12.2 g/dL (ref 12.0–15.0)
MCH: 35 pg — ABNORMAL HIGH (ref 26.0–34.0)
MCHC: 34.6 g/dL (ref 30.0–36.0)
MCV: 101.1 fL — ABNORMAL HIGH (ref 78.0–100.0)
Platelets: 330 10*3/uL (ref 150–400)
RBC: 3.49 MIL/uL — AB (ref 3.87–5.11)
RDW: 14.4 % (ref 11.5–15.5)
WBC: 5.7 10*3/uL (ref 4.0–10.5)

## 2018-07-25 LAB — GLUCOSE, CAPILLARY: GLUCOSE-CAPILLARY: 112 mg/dL — AB (ref 70–99)

## 2018-07-25 MED ORDER — PREDNISONE 20 MG PO TABS
40.0000 mg | ORAL_TABLET | Freq: Every day | ORAL | Status: DC
  Administered 2018-07-26: 40 mg via ORAL
  Filled 2018-07-25: qty 2

## 2018-07-25 MED ORDER — HALOPERIDOL LACTATE 5 MG/ML IJ SOLN
2.0000 mg | Freq: Once | INTRAMUSCULAR | Status: AC
  Administered 2018-07-25: 2 mg via INTRAVENOUS
  Filled 2018-07-25: qty 1

## 2018-07-25 MED ORDER — BISACODYL 10 MG RE SUPP: 10.0000 mg | Freq: Every day | RECTAL | Status: DC | PRN

## 2018-07-25 MED ORDER — RESOURCE THICKENUP CLEAR PO POWD
ORAL | Status: DC | PRN
  Filled 2018-07-25: qty 125

## 2018-07-25 MED ORDER — POLYETHYLENE GLYCOL 3350 17 G PO PACK
17.0000 g | PACK | Freq: Every day | ORAL | Status: DC
  Administered 2018-07-26: 17 g via ORAL
  Filled 2018-07-25: qty 1

## 2018-07-25 MED ORDER — NICOTINE 14 MG/24HR TD PT24
14.0000 mg | MEDICATED_PATCH | Freq: Every day | TRANSDERMAL | Status: DC
  Administered 2018-07-25 (×2): 14 mg via TRANSDERMAL
  Filled 2018-07-25 (×2): qty 1

## 2018-07-25 NOTE — Evaluation (Signed)
Physical Therapy Evaluation Patient Details Name: April PoissonDeborah S Corrie MRN: [id number] DOB: 10/03/1946 Today's Date: 07/25/2018   History of Present Illness  72 y.o. female with a known history of anxiety, depression, EtOH abuse, HTN, HLD, GERD, presents to the emergency department for evaluation of UTI.  She rec'd Rocephin in the ED and was to be discharged home when she developed a rash proximal to the IV infusion site, weakness, confusion and hypotension.  Pt admitted for observation of anaphylactic reaction, UTI, hypokalemia and hypomagnesemia  Clinical Impression  Pt admitted with above diagnosis. Pt currently with functional limitations due to the deficits listed below (see PT Problem List).  Pt will benefit from skilled PT to increase their independence and safety with mobility to allow discharge to the venue listed below.  Pt assisted with ambulating in hallway. Pt presents with generalized weakness and requiring RW for ambulation today.  Pt would benefit from initial 24/7 supervision and HHPT upon d/c.     Follow Up Recommendations Home health PT;Supervision/Assistance - 24 hour    Equipment Recommendations  None recommended by PT    Recommendations for Other Services       Precautions / Restrictions Precautions Precautions: Fall      Mobility  Bed Mobility Overal bed mobility: Needs Assistance Bed Mobility: Supine to Sit     Supine to sit: Min guard;HOB elevated     General bed mobility comments: increased time and effort  Transfers Overall transfer level: Needs assistance Equipment used: Rolling walker (2 wheeled) Transfers: Sit to/from Stand Sit to Stand: Min guard         General transfer comment: verbal cues for hand placement  Ambulation/Gait Ambulation/Gait assistance: Min guard Gait Distance (Feet): 80 Feet Assistive device: Rolling walker (2 wheeled) Gait Pattern/deviations: Step-through pattern;Narrow base of support     General Gait Details:  verbal cues for posture and RW positioning, distance to tolerance, pt fatigued quickly  Stairs            Wheelchair Mobility    Modified Rankin (Stroke Patients Only)       Balance Overall balance assessment: Mild deficits observed, not formally tested(denies any recent falls)                                           Pertinent Vitals/Pain Pain Assessment: No/denies pain Faces Pain Scale: Hurts little more Pain Location: back pain Pain Descriptors / Indicators: Aching Pain Intervention(s): Repositioned;Monitored during session    Home Living Family/patient expects to be discharged to:: Private residence Living Arrangements: Children Available Help at Discharge: Family;Available 24 hours/day Type of Home: House Home Access: Stairs to enter Entrance Stairs-Rails: Right;Left;Can reach both Entrance Stairs-Number of Steps: 2 Home Layout: One level Home Equipment: Walker - 2 wheels;Cane - single point;Bedside commode      Prior Function Level of Independence: Independent         Comments: walks without AD in home, uses cane going out in yard; sponge bathes per last admission     Hand Dominance        Extremity/Trunk Assessment        Lower Extremity Assessment Lower Extremity Assessment: Generalized weakness       Communication   Communication: No difficulties  Cognition Arousal/Alertness: Awake/alert Behavior During Therapy: Flat affect Overall Cognitive Status: Within Functional Limits for tasks assessed  General Comments      Exercises     Assessment/Plan    PT Assessment Patient needs continued PT services  PT Problem List Decreased strength;Decreased mobility;Decreased activity tolerance;Decreased knowledge of use of DME;Decreased balance       PT Treatment Interventions Gait training;Therapeutic exercise;Patient/family education;DME instruction;Therapeutic  activities;Stair training;Functional mobility training;Balance training    PT Goals (Current goals can be found in the Care Plan section)  Acute Rehab PT Goals PT Goal Formulation: With patient Time For Goal Achievement: 08/01/18 Potential to Achieve Goals: Good    Frequency Min 3X/week   Barriers to discharge        Co-evaluation               AM-PAC PT "6 Clicks" Daily Activity  Outcome Measure Difficulty turning over in bed (including adjusting bedclothes, sheets and blankets)?: A Little Difficulty moving from lying on back to sitting on the side of the bed? : A Lot Difficulty sitting down on and standing up from a chair with arms (e.g., wheelchair, bedside commode, etc,.)?: Unable Help needed moving to and from a bed to chair (including a wheelchair)?: A Little Help needed walking in hospital room?: A Little Help needed climbing 3-5 steps with a railing? : A Lot 6 Click Score: 14    End of Session Equipment Utilized During Treatment: Gait belt Activity Tolerance: Patient tolerated treatment well Patient left: in chair;with call bell/phone within reach;with chair alarm set;with nursing/sitter in room Nurse Communication: Mobility status PT Visit Diagnosis: Other abnormalities of gait and mobility (R26.89)    Time: 1191-47821034-1048 PT Time Calculation (min) (ACUTE ONLY): 14 min   Charges:   PT Evaluation $PT Eval Low Complexity: 1 Low         Zenovia JarredKati Markeria Goetsch, PT, DPT 07/25/2018 Pager: [phone]   Maida SaleLEMYRE,KATHrine E 07/25/2018, 1:35 PM

## 2018-07-25 NOTE — Progress Notes (Signed)
Initial Nutrition Assessment  DOCUMENTATION CODES:   Underweight  INTERVENTION:  - Diet advancement per SLP recommendation. - Will order Magic Cup BID with meals, each supplement provides 290 kcal and 9 grams of protein. - Continue Ensure Enlive BID , each supplement provides 350 kcal and 20 grams of protein--make sure it is thickened to nectar-thick. - Tech/RN to provide feeding assistance, if needed.    NUTRITION DIAGNOSIS:   Inadequate oral intake related to inability to eat as evidenced by NPO status.  GOAL:   Patient will meet greater than or equal to 90% of their needs  MONITOR:   Weight trends, Labs, Skin, Other (Comment)(POC/GOC)  REASON FOR ASSESSMENT:   Malnutrition Screening Tool  ASSESSMENT:   72 y.o. female with a known history of anxiety, depression, EtOH abuse, HTN, HLD, GERD, presents to the emergency department for evaluation of UTI.  She rec'd Rocephin in the ED and was to be discharged home when she developed a rash proximal to the IV infusion site, weakness, confusion and hypotension.  She was treated for anaphylaxis with Epi, steroids, benadryl, H2 blockers and has improved greatly since.   Patient has been NPO since admission. She is noted to be a/o to self only and no family/visitors present at bedside to provide PTA information. Patient is sleeping at this time. RN note from this AM states severe agitation. Did not feel it was appropriate to attempt NFPE at this time; could visualize depletions around clavicle and shoulder areas.   Per review of weight hx in chart, patient weighed 115 lbs on 5/18 and currently weighs 100 lbs. This indicates 15 lb weight loss (13% body weight) in the past 3 months. Highly suspect malnutrition but unable to confirm at this time.   Patient was seen by SLP this AM for bedside swallow evaluation and this afternoon for MBS. Following MBS, SLP recommends dysphagia 2, nectar-thick liquids.    Medications reviewed; 20 mg IV  Pepcid BID, 2 g IV Mg sulfate x1 dose yesterday, 125 mg Solu-medrol x1 dose yesterday, 60 mg Solu-medrol QID, daily multivitamin with minerals, 10 mEq oral KCl BID and 40 mEq oral KCl BID. Labs reviewed; K: 3.4 mmol/L, Cl: 113 mmol/L, creatinine: 0.41 mg/dL, Ca: 7.9 mg/dL.  IVF: NS @ 75 mL/hr.    NUTRITION - FOCUSED PHYSICAL EXAM:  Unable to complete at this time; will attempt at follow-up if feasible at that time.  Diet Order:   Diet Order            Diet NPO time specified  Diet effective now              EDUCATION NEEDS:   No education needs have been identified at this time  Skin:  Skin Assessment: Reviewed RN Assessment  Last BM:  PTA/unknown  Height:   Ht Readings from Last 1 Encounters:  07/24/18 5\' 6"  (1.676 m)    Weight:   Wt Readings from Last 1 Encounters:  07/24/18 45.4 kg    Ideal Body Weight:  59.09 kg  BMI:  Body mass index is 16.14 kg/m.  Estimated Nutritional Needs:   Kcal:  1360-1590 (30-35 kcal/kg)  Protein:  55-68 grams (1.2-1.5 grams/lg)  Fluid:  >/= 1.5 L/day     Trenton GammonJessica Kashif Pooler, MS, RD, LDN, [facility] Inpatient Clinical Dietitian Pager # [phone] After hours/weekend pager # [phone]

## 2018-07-25 NOTE — Care Management Note (Signed)
Case Management Note  Patient Details  Name: Venancio PoissonDeborah S Sanks MRN: [id number] Date of Birth: 08/24/1946  Subjective/Objective:Readmit. [facility] Jermaine aware & following. Per dtr d/c plan return back home w/HHC. Has rw,3n1 @ home.                    Action/Plan:d/c home w/HHC.   Expected Discharge Date:                  Expected Discharge Plan:  Home w Home Health Services  In-House Referral:  NA  Discharge planning Services  CM Consult  Post Acute Care Choice:  Home Health Choice offered to:  Adult Children  DME Arranged:  N/A DME Agency:  NA  HH Arranged:  RN, PT HH Agency:  [facility]  Status of Service:  Completed, signed off  If discussed at Long Length of Stay Meetings, dates discussed:    Additional Comments:  Lanier ClamMahabir, Zhane Donlan, RN 07/25/2018, 3:59 PM

## 2018-07-25 NOTE — Progress Notes (Signed)
Patient very aggitated this morning. Attempting to get out of bed stating she needs to get to the kitchen to smoke. Maren ReamerKaren Kirby NP paged for orders. Haldol 2mg  IV order received. Will give medication per order. Bed is in the lowest locked position, alarm is on, call light within reach. Will continue to monitor.

## 2018-07-25 NOTE — Evaluation (Signed)
Clinical/Bedside Swallow Evaluation Patient Details  Name: Venancio PoissonDeborah S Farinas MRN: [id number] Date of Birth: 07/18/1946  Today's Date: 07/25/2018 Time: SLP Start Time (ACUTE ONLY): 0950 SLP Stop Time (ACUTE ONLY): 1020 SLP Time Calculation (min) (ACUTE ONLY): 30 min  Past Medical History:  Past Medical History:  Diagnosis Date  . Anxiety   . Current smoker   . Depression   . ETOH abuse   . GERD (gastroesophageal reflux disease)   . History of shingles summer 2015  . HTN (hypertension)   . Hyperlipidemia   . Hypertension   . Leg edema   . Neuropathy   . Neuropathy   . Pancreatitis    Past Surgical History:  Past Surgical History:  Procedure Laterality Date  . ERCP N/A 02/01/2017   Procedure: ENDOSCOPIC RETROGRADE CHOLANGIOPANCREATOGRAPHY (ERCP);  Surgeon: Dorena CookeyJohn Hayes, MD;  [facility];  Service: Endoscopy;  Laterality: N/A;  . LAPAROSCOPIC CHOLECYSTECTOMY WITH COMMON DUCT EXPLORATION AND INTRAOP CHOLANGIOGRAM N/A 02/03/2017   Procedure: LAPAROSCOPIC CHOLECYSTECTOMY WITH POSSIBLE COMMON DUCT EXPLORATION AND INTRAOP CHOLANGIOGRAM;  Surgeon: Ovidio Kinavid Newman, MD;  Location: [facility];  Service: General;  Laterality: N/A;  . removal of uterine fibroids  [id number]   HPI:  72 year old female admitted 07/24/18 with UTI. Pt had allergic reaction and was admitted. PMH: anxiety, depression, HTN, HLD, ETOH abuse, GERD. No history of esophageal dilation   Assessment / Plan / Recommendation Clinical Impression  Pt seen at bedside for assessment of swallow function and safety. Per RN report, pt had difficulty swallowing liquid medication earlier, and has a history of GERD and reported dysphagia and failure to thrive prior to admit. Pt presents with adequate oral motor strength and function, and has adequate dentition. Initial presentations of ice chips and puree were tolerated without overt s/s aspiration. As this assessment progressed, however, cough response was noted following thin liquid and  puree trials, raising concern for airway protection. Given current presentation and history, recommend MBS to objectively assess swallow function and identify least restrictive diet. Will continue NPO status until MBS is completed. Also recommend consideration of Palliative Care consult to facilitate establishment of appropriate goals of care. RN and MD informed of results and recommendations.    SLP Visit Diagnosis: Dysphagia, unspecified (R13.10)    Aspiration Risk  Moderate aspiration risk    Diet Recommendation NPO   Medication Administration: Via alternative means    Other  Recommendations Oral Care Recommendations: Oral care QID   Follow up Recommendations   pending MBS results     Frequency and Duration   pending MBS results         Prognosis Prognosis for Safe Diet Advancement: Guarded Barriers to Reach Goals: Cognitive deficits(failure to thrive)      Swallow Study   General Date of Onset: 07/24/18 HPI: 72 year old female admitted 07/24/18 with UTI. Pt had allergic reaction and was admitted. PMH: anxiety, depression, HTN, HLD, ETOH abuse, GERD. No history of esophageal dilation Type of Study: Bedside Swallow Evaluation Previous Swallow Assessment: none found Diet Prior to this Study: NPO Respiratory Status: Room air History of Recent Intubation: No Behavior/Cognition: Cooperative;Pleasant mood;Alert Oral Cavity Assessment: Within Functional Limits Oral Care Completed by SLP: No Oral Cavity - Dentition: Adequate natural dentition Vision: Functional for self-feeding Self-Feeding Abilities: Needs assist Patient Positioning: Upright in bed Baseline Vocal Quality: Normal Volitional Cough: Weak Volitional Swallow: Able to elicit    Oral/Motor/Sensory Function Overall Oral Motor/Sensory Function: Within functional limits   Ice Chips Ice chips: Within  functional limits Presentation: Spoon   Thin Liquid Thin Liquid: Impaired Presentation: Cup Pharyngeal  Phase  Impairments: Suspected delayed Swallow;Cough - Immediate;Multiple swallows    Nectar Thick Nectar Thick Liquid: Not tested   Honey Thick Honey Thick Liquid: Not tested   Puree Puree: Impaired Presentation: Spoon Pharyngeal Phase Impairments: Multiple swallows;Suspected delayed Swallow;Cough - Immediate   Solid     Solid: Within functional limits Presentation: Spoon Other Comments: required assistance with feeding.      Celia B. Murvin NatalBueche, [facility] LLCMSP, [name] Speech Language Pathologist [phone]  Leigh AuroraBueche, Celia Brown 07/25/2018,10:25 AM

## 2018-07-25 NOTE — Progress Notes (Signed)
PROGRESS NOTE    April [name]  XBJ:[id number] DOB: 10/13/1946 DOA: 07/24/2018 PCP: Richmond CampbellKaplan, Kristen W., PA-C   Brief Narrative: April PoissonDeborah S [name] is a 72 y.o. emale with a known history of anxiety, depression, EtOH abuse, HTN, HLD, GERD. She presented secondary to weakness and found to have a UTI. Likely a chronic component. While in the ED, she had an anaphylactic reaction to Ceftriaxone and required epinephrine. She is currently admitted for her anaphylactic reaction, awaiting PT and being treated for her UTI   Assessment & Plan:   Active Problems:   Alcohol abuse   Protein-calorie malnutrition, severe   FTT (failure to thrive) in adult   Anaphylactic reaction   Anaphylactic reaction Thought secondary to ceftriaxone. Rash and hypotension. Appears resolved -Transition to prednisone, Pepcid, Benadryl  UTI Transitioned from ceftriaxone to Ciprofloxacin secondary to allergy reaction -Continue Cipro -Urine culture pending  Constipation Stool on x-ray. Cause of abdominal pain. No obstruction. -Continue Dulcolax -Add Miralax  Hypokalemia Supplementation given  V-tach In setting of hypokalemia/hypomagnesemia -Replete as above  Alcohol abuse -Continue CIWA  Generalized weakness -PT eval  Dysphagia -SLP evaluation  Anxiety, depression, insomnia Depression appears pretty significant. Patient is extremely flat and not very engaging. -Restart home Klonopin, Elavil, Remeron -Palliative care consult  Severe malnutrition Body mass index is 16.14 kg/m.   DVT prophylaxis: Lovenox Code Status:   Code Status: Full Code Family Communication: None at bedside Disposition Plan: Discharge pending PT eval   Consultants:   Palliative care medicine  Procedures:   None  Antimicrobials:  Ceftriaxone  Ciprofloxacin    Subjective: Some abdominal pain.  Objective: Vitals:   07/24/18 1842 07/24/18 1845 07/24/18 1945 07/24/18 2037  BP: 105/62 112/63 106/64  91/61  Pulse: 66 65 70 65  Resp: (!) 21 14 16 20   Temp:    (!) 97.1 F (36.2 C)  TempSrc:    Oral  SpO2: 100% 100% 99% 100%  Weight:      Height:        Intake/Output Summary (Last 24 hours) at 07/25/2018 1330 Last data filed at 07/25/2018 0800 Gross per 24 hour  Intake 3086.31 ml  Output -  Net 3086.31 ml   Filed Weights   07/24/18 1046  Weight: 45.4 kg    Examination:  General exam: Appears calm and comfortable Respiratory system: Clear to auscultation. Respiratory effort normal. Cardiovascular system: S1 & S2 heard, RRR. No murmurs, rubs, gallops or clicks. Gastrointestinal system: Abdomen is nondistended, soft and nontender. Normal bowel sounds heard. Central nervous system: Alert and oriented. No focal neurological deficits. Extremities: No edema. No calf tenderness Skin: No cyanosis. No rashes Psychiatry: Judgement and insight appear impaired. Depressed mood with flat affect. Does not make good eye contact. Withdrawn.    Data Reviewed: I have personally reviewed following labs and imaging studies  CBC: Recent Labs  Lab 07/24/18 1112 07/25/18 0447  WBC 7.6 5.7  NEUTROABS 4.3  --   HGB 12.8 12.2  HCT 36.9 35.3*  MCV 101.9* 101.1*  PLT 382 330   Basic Metabolic Panel: Recent Labs  Lab 07/24/18 1112 07/24/18 1252 07/25/18 0447  NA 142  --  140  K 2.9*  --  3.4*  CL 111  --  113*  CO2 22  --  21*  GLUCOSE 114*  --  202*  BUN 22  --  14  CREATININE 0.69  --  0.41*  CALCIUM 8.4*  --  7.9*  MG  --  1.7  --  GFR: Estimated Creatinine Clearance: 46.9 mL/min (A) (by C-G formula based on SCr of 0.41 mg/dL (L)). Liver Function Tests: Recent Labs  Lab 07/24/18 1112  AST 15  ALT 8  ALKPHOS 93  BILITOT 0.5  PROT 5.9*  ALBUMIN 3.1*   No results for input(s): LIPASE, AMYLASE in the last 168 hours. Recent Labs  Lab 07/24/18 1112  AMMONIA 15   Coagulation Profile: Recent Labs  Lab 07/24/18 1112  INR 1.00   Cardiac Enzymes: No results for  input(s): CKTOTAL, CKMB, CKMBINDEX, TROPONINI in the last 168 hours. BNP (last 3 results) No results for input(s): PROBNP in the last 8760 hours. HbA1C: No results for input(s): HGBA1C in the last 72 hours. CBG: Recent Labs  Lab 07/24/18 1636  GLUCAP 112*   Lipid Profile: No results for input(s): CHOL, HDL, LDLCALC, TRIG, CHOLHDL, LDLDIRECT in the last 72 hours. Thyroid Function Tests: No results for input(s): TSH, T4TOTAL, FREET4, T3FREE, THYROIDAB in the last 72 hours. Anemia Panel: No results for input(s): VITAMINB12, FOLATE, FERRITIN, TIBC, IRON, RETICCTPCT in the last 72 hours. Sepsis Labs: No results for input(s): PROCALCITON, LATICACIDVEN in the last 168 hours.  No results found for this or any previous visit (from the past 240 hour(s)).       Radiology Studies: Dg Chest 2 View  Result Date: 07/24/2018 CLINICAL DATA:  Weakness x1 week EXAM: CHEST - 2 VIEW COMPARISON:  CT chest dated 04/30/2018 FINDINGS: Lungs are clear.  No pleural effusion or pneumothorax. The heart is normal in size. Visualized osseous structures are within normal limits. IMPRESSION: Normal chest radiographs. Electronically Signed   By: Charline Bills M.D.   On: 07/24/2018 11:49   Ct Head Wo Contrast  Result Date: 07/24/2018 CLINICAL DATA:  Pt from home via EMS c/o weakness x1 week. Pt daughter reports that pt has not been eating well for approx 1 month but for the past week almost nothing to eat. Pt denies pain. Pt received 700 cc NS en route. Pt is A&O and in NAD. Pt placed on 2L d/t O2 being 86% on RA. Pt has been a lifelong smoker per EMS EXAM: CT HEAD WITHOUT CONTRAST TECHNIQUE: Contiguous axial images were obtained from the base of the skull through the vertex without intravenous contrast. COMPARISON:  04/30/2018 FINDINGS: Brain: No evidence of acute infarction, hemorrhage, hydrocephalus, extra-axial collection or mass lesion/mass effect. There is ventricular and sulcal enlargement reflecting mild  generalized atrophy stable from the prior study. Minor periventricular white matter hypoattenuation is noted consistent with chronic microvascular ischemic change, also stable. Vascular: No hyperdense vessel or unexpected calcification. Skull: Normal. Negative for fracture or focal lesion. Sinuses/Orbits: In normal globes and orbits. Visualized sinuses and mastoid air cells are clear. Other: None. IMPRESSION: 1. No acute intracranial abnormalities. 2. Atrophy and minor chronic microvascular ischemic change. Stable appearance from the prior head CT. Electronically Signed   By: Amie Portland M.D.   On: 07/24/2018 11:34   Dg Chest Port 1 View  Result Date: 07/25/2018 CLINICAL DATA:  Recent aspiration EXAM: PORTABLE CHEST 1 VIEW COMPARISON:  07/24/2018 FINDINGS: Cardiac shadow is stable. The lungs are well aerated bilaterally. Minimal haziness over the left hemidiaphragm is noted likely related to a small effusion and atelectatic changes. No other focal abnormality is seen. IMPRESSION: Mild left basilar changes as described. Electronically Signed   By: Alcide Clever M.D.   On: 07/25/2018 00:25   Dg Abd Portable 2v  Result Date: 07/24/2018 CLINICAL DATA:  Abdominal pain. EXAM: PORTABLE  ABDOMEN - 2 VIEW COMPARISON:  05/31/2018 FINDINGS: No pathologic dilatation of the small or large bowel loops. There are air-filled loops of small bowel and a moderate stool burden noted throughout the colon. No air-fluid levels identified. IMPRESSION: 1. Nonobstructive bowel gas pattern. 2. Moderate stool burden noted within the colon. Electronically Signed   By: Signa Kellaylor  Stroud M.D.   On: 07/24/2018 21:32   Dg Swallowing Func-speech Pathology  Result Date: 07/25/2018 Objective Swallowing Evaluation: Type of Study: MBS-Modified Barium Swallow Study  Patient Details Name: April [name] MRN: [id number] Date of Birth: 07/23/1946 Today's Date: 07/25/2018 Time: SLP Start Time (ACUTE ONLY): 1230 -SLP Stop Time (ACUTE ONLY): 1300 SLP  Time Calculation (min) (ACUTE ONLY): 30 min Past Medical History: Past Medical History: Diagnosis Date . Anxiety  . Current smoker  . Depression  . ETOH abuse  . GERD (gastroesophageal reflux disease)  . History of shingles summer 2015 . HTN (hypertension)  . Hyperlipidemia  . Hypertension  . Leg edema  . Neuropathy  . Neuropathy  . Pancreatitis  Past Surgical History: Past Surgical History: Procedure Laterality Date . ERCP N/A 02/01/2017  Procedure: ENDOSCOPIC RETROGRADE CHOLANGIOPANCREATOGRAPHY (ERCP);  Surgeon: Dorena CookeyJohn Hayes, MD;  [facility];  Service: Endoscopy;  Laterality: N/A; . LAPAROSCOPIC CHOLECYSTECTOMY WITH COMMON DUCT EXPLORATION AND INTRAOP CHOLANGIOGRAM N/A 02/03/2017  Procedure: LAPAROSCOPIC CHOLECYSTECTOMY WITH POSSIBLE COMMON DUCT EXPLORATION AND INTRAOP CHOLANGIOGRAM;  Surgeon: Ovidio Kinavid Newman, MD;  Location: [facility];  Service: General;  Laterality: N/A; . removal of uterine fibroids  [id number] HPI: 72 year old female admitted 07/24/18 with UTI. Pt had allergic reaction and was admitted. PMH: anxiety, depression, HTN, HLD, ETOH abuse, GERD. No history of esophageal dilation  Subjective: Pt seen in radiology for MBS. Assessment / Plan / Recommendation CHL IP CLINICAL IMPRESSIONS 07/25/2018 Clinical Impression Pt presents with moderate oropharyngeal dysphagia, characterized as follows: Orally, pt exhibits decreased bolus formation across consistencies with premature spillage to the vallecular sinus. Pt was unable to propel barium tablet posteriorly to swallow it, and expectorated it. No other post-swallow residue noted orally. Pharyngeally, pt exhibited delayed swallow reflex, with trigger noted at the vallecula across consistencies. There was no post-swallow residue after the swallow, however, poor airway closure was noted. Pt exhibited penetration of thin liquids during the swallow, which did not clear. Aspiration of thin liquid was seen when given with barium tablet. Immediate cough response noted  after aspiration. Nectar thick liquids were also penetrated during the swallow, however, penetrate was trace and consistently cleared spontaneously from the airway. Pt exhibited throat clear following penetration. Puree and soft solids were tolerated without penetration or aspiration. At this time, recommend dys 2 diet (primarily for energy conservation) and nectar thick liquids via cup sip (pt reports she does not use straws). Recommend crushed meds in puree. Safe swallow precautions were reviewed with pt, [facility] SLP will follow for assessment of diet tolerance and education.  SLP Visit Diagnosis Dysphagia, oropharyngeal phase (R13.12) Impact on safety and function Moderate aspiration risk   CHL IP TREATMENT RECOMMENDATION 07/25/2018 Treatment Recommendations Therapy as outlined in treatment plan below   Prognosis 07/25/2018 Prognosis for Safe Diet Advancement Fair Barriers to Reach Goals Cognitive deficits CHL IP DIET RECOMMENDATION 07/25/2018 SLP Diet Recommendations Dysphagia 2 (Fine chop) solids;Nectar thick liquid Liquid Administration via No straw;Cup Medication Administration Crushed with puree Compensations Minimize environmental distractions;Small sips/bites;Slow rate Postural Changes Seated upright at 90 degrees;Remain semi-upright after after feeds/meals (Comment)   CHL IP OTHER RECOMMENDATIONS 07/25/2018 Oral Care  Recommendations Oral care QID Other Recommendations Order thickener from pharmacy;Remove water pitcher   CHL IP FOLLOW UP RECOMMENDATIONS 07/25/2018 Follow up Recommendations SLP will follow acutely for assessment of diet tolerance and education.   CHL IP FREQUENCY AND DURATION 07/25/2018 Speech Therapy Frequency (ACUTE ONLY) min 2x/week Treatment Duration 1 week;2 weeks      CHL IP ORAL PHASE 07/25/2018 Oral Phase Impaired Oral - Nectar Cup, Straw Premature spillage;Decreased bolus cohesion Oral - Thin Cup, Straw Premature spillage;Decreased bolus cohesion Oral - Mech Soft Premature  spillage;Decreased bolus cohesion Oral - Pill Reduced posterior propulsion;Piecemeal swallowing. Unable to clear pill from oral cavity    CHL IP PHARYNGEAL PHASE 07/25/2018 Pharyngeal Phase Impaired Pharyngeal- Nectar Cup, Straw Delayed swallow initiation-vallecula;Reduced airway/laryngeal closure;Penetration/Aspiration during swallow Pharyngeal Material enters airway, remains ABOVE vocal cords then ejected out Pharyngeal- Thin Cup, Straw Delayed swallow initiation-vallecula;Reduced airway/laryngeal closure;Penetration/Aspiration during swallow Pharyngeal Material enters airway, CONTACTS cords and not ejected out;Material enters airway, remains ABOVE vocal cords and not ejected out;Material enters airway, passes BELOW cords without attempt by patient to eject out (silent aspiration) Pharyngeal- Puree Delayed swallow initiation-vallecula Pharyngeal- Mechanical Soft Delayed swallow initiation-vallecula Pharyngeal- Pill Pt spit out pill, did not swallow.  CHL IP CERVICAL ESOPHAGEAL PHASE 07/25/2018 Cervical Esophageal Phase [facility] Celia B. Murvin Natal [facility], [name] Speech Language Pathologist [phone] Leigh Aurora 07/25/2018, 1:16 PM                   Scheduled Meds: . diphenhydrAMINE  25 mg Intravenous Q6H  . enoxaparin (LOVENOX) injection  30 mg Subcutaneous QHS  . feeding supplement (ENSURE ENLIVE)  237 mL Oral BID BM  . methylPREDNISolone (SOLU-MEDROL) injection  60 mg Intravenous Q6H  . multivitamin with minerals  1 tablet Oral Daily  . nicotine  14 mg Transdermal QHS  . potassium chloride  10 mEq Oral BID  . potassium chloride  40 mEq Oral BID  . potassium chloride  40 mEq Oral Once   Continuous Infusions: . sodium chloride 75 mL/hr (07/25/18 1022)  . ciprofloxacin 400 mg (07/25/18 1023)  . famotidine (PEPCID) IV 20 mg (07/25/18 1138)     LOS: 0 days     Jacquelin Hawking, MD [facility] 07/25/2018, 1:30 PM Pager: [phone]  If 7PM-7AM, please contact  night-coverage [email or web address] 07/25/2018, 1:30 PM

## 2018-07-25 NOTE — Consult Note (Signed)
Modified Barium Swallow Progress Note  Patient Details  Name: April [name] MRN: [id number] Date of Birth: 09/11/1946  Today's Date: 07/25/2018  Modified Barium Swallow completed.  Full report located under Chart Review in the Imaging Section.  Brief recommendations include the following:  Clinical Impression Pt presents with moderate oropharyngeal dysphagia, characterized as follows:   Orally, pt exhibits decreased bolus formation across consistencies with premature spillage to the vallecular sinus. Pt was unable to propel barium tablet posteriorly to swallow it, and expectorated it. No other post-swallow residue noted orally.   Pharyngeally, pt exhibited delayed swallow reflex, with trigger noted at the vallecula across consistencies. There was no post-swallow residue after the swallow, however, poor airway closure was noted. Pt exhibited penetration of thin liquids during the swallow, which did not clear. Aspiration of thin liquid was seen when given with barium tablet. Immediate cough response noted after aspiration. Nectar thick liquids were also penetrated during the swallow, however, penetrate was trace and consistently cleared spontaneously from the airway. Pt exhibited throat clear following penetration. Puree and soft solids were tolerated without penetration or aspiration.   At this time, recommend dys 2 diet (primarily for energy conservation) and nectar thick liquids via cup sip (pt reports she does not use straws). Recommend crushed meds in puree. Safe swallow precautions [facility] with pt[facility] SLP will follow for assessment of diet tolerance and education.     Swallow Evaluation Recommendations  SLP Diet Recommendations: Dysphagia 2 (Fine chop) solids;Nectar thick liquid   Liquid Administration via: No straw;Cup   Medication Administration: Crushed with puree   Supervision: Full assist for feeding;Patient able to self feed;Full supervision/cueing for  compensatory strategies   Compensations: Minimize environmental distractions;Small sips/bites;Slow rate   Postural Changes: Seated upright at 90 degrees;Remain semi-upright after after feeds/meals (Comment)   Oral Care Recommendations: Oral care QID   Other Recommendations: Order thickener from pharmacy;Remove water pitcher  Celia B. Murvin NatalBueche, [facility] Hospital[facility], [name] Speech Language Pathologist [phone]  Leigh AuroraBueche, Celia Brown 07/25/2018,1:21 PM

## 2018-07-25 NOTE — Care Management Obs Status (Signed)
MEDICARE OBSERVATION STATUS NOTIFICATION   Patient Details  Name: April PoissonDeborah S Bielinski MRN: [id number] Date of Birth: 05/16/1946   Medicare Observation Status Notification Given:  Yes    Lanier ClamMahabir, Ichiro Chesnut, RN 07/25/2018, 3:48 PM

## 2018-07-25 NOTE — Progress Notes (Signed)
Inpatient Diabetes Program Recommendations  AACE/ADA: New Consensus Statement on Inpatient Glycemic Control (2015)  Target Ranges:  Prepandial:   less than 140 mg/dL      Peak postprandial:   less than 180 mg/dL (1-2 hours)      Critically ill patients:  140 - 180 mg/dL   Results for Venancio PoissonCRATER, Zainab S (MRN [id number]) as of 07/25/2018 10:21  Ref. Range 07/25/2018 04:47  Glucose Latest Ref Range: 70 - 99 mg/dL 409202 (H)    Admit with: Weakness/ Anaphylactic reaction likely to Rocephin/ UTI  History: ETOH Abuse, HTN  NO History of Diabetes noted       MD- Note patient currently receiving Solumedrol 60 mg Q6 hours.  Lab glucose elevated to 202 mg/dl this AM.  Please consider placing orders for Novolog Sensitive Correction Scale/ SSI (0-9 units) TID AC + HS while patient getting IV steroids     --Will follow patient during hospitalization--  Ambrose FinlandJeannine Johnston Tyhesha Dutson RN, MSN, CDE Diabetes Coordinator Inpatient Glycemic Control Team Team Pager: [phone] (8a-5p)

## 2018-07-26 ENCOUNTER — Ambulatory Visit: Payer: PPO | Admitting: Dietician

## 2018-07-26 ENCOUNTER — Encounter

## 2018-07-26 LAB — URINE CULTURE: Culture: >=100000 — AB

## 2018-07-26 MED ORDER — POLYETHYLENE GLYCOL 3350 17 G PO PACK: 17.0000 g | PACK | Freq: Every day | ORAL | 0 refills | Status: AC

## 2018-07-26 MED ORDER — RESOURCE THICKENUP CLEAR PO POWD: ORAL | Status: DC

## 2018-07-26 MED ORDER — ENSURE ENLIVE PO LIQD: 237.0000 mL | Freq: Two times a day (BID) | ORAL | 12 refills | Status: AC

## 2018-07-26 MED ORDER — PREDNISONE 20 MG PO TABS: ORAL_TABLET | ORAL | 0 refills | Status: AC

## 2018-07-26 MED ORDER — CIPROFLOXACIN HCL 500 MG PO TABS: 500.0000 mg | ORAL_TABLET | Freq: Two times a day (BID) | ORAL | 0 refills | Status: AC

## 2018-07-26 NOTE — Discharge Instructions (Signed)
April [name],  You were in the hospital because you had an allergic reaction to ceftriaxone. This allergy has been added to your medical record. You will go home on a steroid taper. You were also found to have a UTI. For this, you will continue antibiotics. You have two more doses and can take your first dose this evening and your last dose tomorrow morning. Your diet was recommended to be adjusted, along with thickening your fluid intake. Please follow-up with your primary care physician.

## 2018-07-26 NOTE — Care Management Note (Signed)
Case Management Note  Patient Details  Name: April [name] MRN: [id number] Date of Birth: 05/14/1946  Subjective/Objective:  PT recc HHPT-patient used [facility] in past will use again-HHPT ordered-rep Jermaine aware of d/c & [facility]HC orders. No further CM needs.                  Action/Plan:d/c home w/HHC.   Expected Discharge Date:  07/26/18               Expected Discharge Plan:  Home w Home Health Services  In-House Referral:  NA  Discharge planning Services  CM Consult  Post Acute Care Choice:  Home Health, Durable Medical Equipment(rw) Choice offered to:  Adult Children  DME Arranged:    DME Agency:  NA  HH Arranged:  PT HH Agency:  [facility]  Status of Service:  Completed, signed off  If discussed at Long Length of Stay Meetings, dates discussed:    Additional Comments:  Lanier ClamMahabir, Coulson Wehner, RN 07/26/2018, 11:15 AM

## 2018-07-26 NOTE — Discharge Summary (Addendum)
Physician Discharge Summary  April [name]:[id number] DOB: [birth year] DOA: 07/24/2018  PCP: Richmond Campbell., PA-C  Admit date: 07/24/2018 Discharge date: 07/26/2018  Admitted From: Home Disposition: Home  Recommendations for Outpatient Follow-up:  1. Follow up with PCP in 1 week 2. Please obtain BMP/CBC in one week 3. Consider palliative care consult 4. Please follow up on the following pending results: None  Home Health: PT Equipment/Devices: None  Discharge Condition: Stable CODE STATUS: Full code Diet recommendation:  Dysphagia 2 (Fine chop) solids;Nectar thick liquid  Liquid Administration via: No straw;Cup  Medication Administration: Crushed with puree  Supervision: Full assist for feeding;Patient able to self feed;Full supervision/cueing for compensatory strategies  Compensations: Minimize environmental distractions;Small sips/bites;Slow rate  Postural Changes: Seated upright at 90 degrees;Remain semi-upright after after feeds/meals (Comment)   Brief/Interim Summary:  Admission HPI written by Tonye Royalty, DO   HPI: April [name] is a 72 y.o. female with a known history of anxiety, depression, EtOH abuse, HTN, HLD, GERD, presents to the emergency department for evaluation of UTI.  She rec'd Rocephin in the ED and was to be discharged home when she developed a rash proximal to the IV infusion site, weakness, confusion and hypotension.  She was treated for anaphylaxis with Epi, steroids, benadryl, H2 blockers and has improved greatly since.    Per daughter, mom has sustained a steady decline in functional capacity over the past few weeks with a dramatic decline over the past 24 hours.  She came to ER complaining of SOB, weakness, nausea, decreased appetite.  She has not been drinking as much alcohol lately, down to one glass a day,     Hospital course:  Anaphylactic reaction Thought secondary to ceftriaxone. Rash and hypotension. Appears resolved.  Prednisone taper.  UTI Transitioned from ceftriaxone to Ciprofloxacin secondary to allergy reaction. Urine culture significant for enterobacter aerogenes. Continue Ciprofloxacin for a 3 day total course. Discharge with two more doses.  Constipation Stool on x-ray. Cause of abdominal pain. No obstruction. Stool softeners.  Hypokalemia Supplementation given  V-tach In setting of hypokalemia. Resolved.  Alcohol abuse Continue CIWA  Generalized weakness PT recommending home health physical therapy.  Dysphagia Patient transitioned to dysphagia 2 diet with recommendations for nectar thickened liquid.  Anxiety, depression, insomnia Depression appears pretty significant. Patient is extremely flat and not very engaging. This improved prior to discharge. Continue home regimen. Palliative care consulted but not seen prior to discharge summary.  Severe malnutrition Body mass index is 16.14 kg/m  Discharge Diagnoses:  Active Problems:   Alcohol abuse   Protein-calorie malnutrition, severe   FTT (failure to thrive) in adult   Anaphylactic reaction    Discharge Instructions  Discharge Instructions    Face-to-face encounter (required for Medicare/Medicaid patients)   Complete by:  As directed    I Tilden Fossa certify that this patient is under my care and that I, or a nurse practitioner or physician's assistant working with me, had a face-to-face encounter that meets the physician face-to-face encounter requirements with this patient on 07/24/2018. The encounter with the patient was in whole, or in part for the following medical condition(s) which is the primary reason for home health care (List medical condition): patient in the ED for weakness, decreased oral intake and difficulty with walking. She is deconditioned with poor nutrition.   The encounter with the patient was in whole, or in part, for the following medical condition, which is the primary reason for home health  care:  weakness  I certify that, based on my findings, the following services are medically necessary home health services:   Physical therapy Nursing     Reason for Medically Necessary Home Health Services:  Skilled Nursing- Change/Decline in Patient Status   My clinical findings support the need for the above services:  Unable to leave home safely without assistance and/or assistive device   Further, I certify that my clinical findings support that this patient is homebound due to:  Unable to leave home safely without assistance   Home Health   Complete by:  As directed    To provide the following care/treatments:   PT RN     Increase activity slowly   Complete by:  As directed      Allergies as of 07/26/2018      Reactions   Naltrexone Shortness Of Breath   Rocephin [ceftriaxone Sodium In Dextrose] Anaphylaxis   Penicillins Rash   Has patient had a PCN reaction causing immediate rash, facial/tongue/throat swelling, SOB or lightheadedness with hypotension: Yes immediate rash  Has patient had a PCN reaction causing severe rash involving mucus membranes or skin necrosis: Yes Has patient had a PCN reaction that required hospitalization": No Has patient had a PCN reaction occurring within the last 10 years: Yes If all of the above answers are "NO", then may proceed with Cephalosporin use.      Medication List    STOP taking these medications   Potassium Chloride ER 20 MEQ Tbcr     TAKE these medications   amitriptyline 25 MG tablet Commonly known as:  ELAVIL Take 25 mg by mouth at bedtime as needed for sleep.   ciprofloxacin 500 MG tablet Commonly known as:  CIPRO Take 1 tablet (500 mg total) by mouth 2 (two) times daily for 2 doses. Take one this evening (8/13) and tomorrow morning (8/14)   clonazePAM 0.5 MG tablet Commonly known as:  KLONOPIN Take 0.5 mg by mouth 2 (two) times daily as needed for anxiety. as directed   feeding supplement (ENSURE ENLIVE) Liqd Take 237  mLs by mouth 2 (two) times daily between meals.   mirtazapine 7.5 MG tablet Commonly known as:  REMERON Take 7.5 mg by mouth at bedtime.   multivitamin with minerals Tabs tablet Take 1 tablet by mouth daily.   polyethylene glycol packet Commonly known as:  MIRALAX / GLYCOLAX Take 17 g by mouth daily. Start taking on:  07/27/2018   potassium chloride 10 MEQ tablet Commonly known as:  K-DUR,KLOR-CON Take 10 mEq by mouth 2 (two) times daily.   predniSONE 20 MG tablet Commonly known as:  DELTASONE Take 1 tablet (20 mg total) by mouth daily with breakfast for 2 days, THEN 0.5 tablets (10 mg total) daily with breakfast for 2 days. Start taking on:  07/27/2018   RESOURCE THICKENUP CLEAR Powd Nectar thick consistency for fluid intake      Follow-up Information    [facility] Follow up.   Specialty:  Home Health Services Why:  Home Health RN and Physical Therapy-agency will call to arrange initial visit Contact information: [address] [place] [address] [phone]        Schedule an appointment as soon as possible for a visit  with Richmond Campbell., PA-C.   Specialty:  Family Medicine Contact information: [address] [place] [address] [phone]          Allergies  Allergen Reactions  . Naltrexone Shortness Of Breath  . Rocephin [  Ceftriaxone Sodium In Dextrose] Anaphylaxis  . Penicillins Rash    Has patient had a PCN reaction causing immediate rash, facial/tongue/throat swelling, SOB or lightheadedness with hypotension: Yes immediate rash  Has patient had a PCN reaction causing severe rash involving mucus membranes or skin necrosis: Yes Has patient had a PCN reaction that required hospitalization": No Has patient had a PCN reaction occurring within the last 10 years: Yes If all of the above answers are "NO", then may proceed with Cephalosporin use.     Consultations:  Palliative care  medicine   Procedures/Studies: Dg Chest 2 View  Result Date: 07/24/2018 CLINICAL DATA:  Weakness x1 week EXAM: CHEST - 2 VIEW COMPARISON:  CT chest dated 04/30/2018 FINDINGS: Lungs are clear.  No pleural effusion or pneumothorax. The heart is normal in size. Visualized osseous structures are within normal limits. IMPRESSION: Normal chest radiographs. Electronically Signed   By: Charline BillsSriyesh  Krishnan M.D.   On: 07/24/2018 11:49   Ct Head Wo Contrast  Result Date: 07/24/2018 CLINICAL DATA:  Pt from home via EMS c/o weakness x1 week. Pt daughter reports that pt has not been eating well for approx 1 month but for the past week almost nothing to eat. Pt denies pain. Pt received 700 cc NS en route. Pt is A&O and in NAD. Pt placed on 2L d/t O2 being 86% on RA. Pt has been a lifelong smoker per EMS EXAM: CT HEAD WITHOUT CONTRAST TECHNIQUE: Contiguous axial images were obtained from the base of the skull through the vertex without intravenous contrast. COMPARISON:  04/30/2018 FINDINGS: Brain: No evidence of acute infarction, hemorrhage, hydrocephalus, extra-axial collection or mass lesion/mass effect. There is ventricular and sulcal enlargement reflecting mild generalized atrophy stable from the prior study. Minor periventricular white matter hypoattenuation is noted consistent with chronic microvascular ischemic change, also stable. Vascular: No hyperdense vessel or unexpected calcification. Skull: Normal. Negative for fracture or focal lesion. Sinuses/Orbits: In normal globes and orbits. Visualized sinuses and mastoid air cells are clear. Other: None. IMPRESSION: 1. No acute intracranial abnormalities. 2. Atrophy and minor chronic microvascular ischemic change. Stable appearance from the prior head CT. Electronically Signed   By: Amie Portlandavid  Ormond M.D.   On: 07/24/2018 11:34   Dg Chest Port 1 View  Result Date: 07/25/2018 CLINICAL DATA:  Recent aspiration EXAM: PORTABLE CHEST 1 VIEW COMPARISON:  07/24/2018 FINDINGS:  Cardiac shadow is stable. The lungs are well aerated bilaterally. Minimal haziness over the left hemidiaphragm is noted likely related to a small effusion and atelectatic changes. No other focal abnormality is seen. IMPRESSION: Mild left basilar changes as described. Electronically Signed   By: Alcide CleverMark  Lukens M.D.   On: 07/25/2018 00:25   Dg Abd Portable 2v  Result Date: 07/24/2018 CLINICAL DATA:  Abdominal pain. EXAM: PORTABLE ABDOMEN - 2 VIEW COMPARISON:  05/31/2018 FINDINGS: No pathologic dilatation of the small or large bowel loops. There are air-filled loops of small bowel and a moderate stool burden noted throughout the colon. No air-fluid levels identified. IMPRESSION: 1. Nonobstructive bowel gas pattern. 2. Moderate stool burden noted within the colon. Electronically Signed   By: Signa Kellaylor  Stroud M.D.   On: 07/24/2018 21:32   Dg Swallowing Func-speech Pathology  Result Date: 07/25/2018 Objective Swallowing Evaluation: Type of Study: MBS-Modified Barium Swallow Study  Patient Details Name: April [name] MRN: [id number] Date of Birth: 12/14/1945 Today's Date: 07/25/2018 Time: SLP Start Time (ACUTE ONLY): 1230 -SLP Stop Time (ACUTE ONLY): 1300 SLP Time Calculation (min) (ACUTE ONLY): 30 min  Past Medical History: Past Medical History: Diagnosis Date . Anxiety  . Current smoker  . Depression  . ETOH abuse  . GERD (gastroesophageal reflux disease)  . History of shingles summer 2015 . HTN (hypertension)  . Hyperlipidemia  . Hypertension  . Leg edema  . Neuropathy  . Neuropathy  . Pancreatitis  Past Surgical History: Past Surgical History: Procedure Laterality Date . ERCP N/A 02/01/2017  Procedure: ENDOSCOPIC RETROGRADE CHOLANGIOPANCREATOGRAPHY (ERCP);  Surgeon: Dorena CookeyJohn Hayes, MD;  [facility];  Service: Endoscopy;  Laterality: N/A; . LAPAROSCOPIC CHOLECYSTECTOMY WITH COMMON DUCT EXPLORATION AND INTRAOP CHOLANGIOGRAM N/A 02/03/2017  Procedure: LAPAROSCOPIC CHOLECYSTECTOMY WITH POSSIBLE COMMON DUCT  EXPLORATION AND INTRAOP CHOLANGIOGRAM;  Surgeon: Ovidio Kinavid Newman, MD;  Location: [facility];  Service: General;  Laterality: N/A; . removal of uterine fibroids  [id number] HPI: 72 year old female admitted 07/24/18 with UTI. Pt had allergic reaction and was admitted. PMH: anxiety, depression, HTN, HLD, ETOH abuse, GERD. No history of esophageal dilation  Subjective: Pt seen in radiology for MBS. Assessment / Plan / Recommendation CHL IP CLINICAL IMPRESSIONS 07/25/2018 Clinical Impression Pt presents with moderate oropharyngeal dysphagia, characterized as follows: Orally, pt exhibits decreased bolus formation across consistencies with premature spillage to the vallecular sinus. Pt was unable to propel barium tablet posteriorly to swallow it, and expectorated it. No other post-swallow residue noted orally. Pharyngeally, pt exhibited delayed swallow reflex, with trigger noted at the vallecula across consistencies. There was no post-swallow residue after the swallow, however, poor airway closure was noted. Pt exhibited penetration of thin liquids during the swallow, which did not clear. Aspiration of thin liquid was seen when given with barium tablet. Immediate cough response noted after aspiration. Nectar thick liquids were also penetrated during the swallow, however, penetrate was trace and consistently cleared spontaneously from the airway. Pt exhibited throat clear following penetration. Puree and soft solids were tolerated without penetration or aspiration. At this time, recommend dys 2 diet (primarily for energy conservation) and nectar thick liquids via cup sip (pt reports she does not use straws). Recommend crushed meds in puree. Safe swallow precautions were reviewed with pt, and [facility]. SLP will follow for assessment of diet tolerance and education.  SLP Visit Diagnosis Dysphagia, oropharyngeal phase (R13.12) Impact on safety and function Moderate aspiration risk   CHL IP TREATMENT RECOMMENDATION 07/25/2018 Treatment  Recommendations Therapy as outlined in treatment plan below   Prognosis 07/25/2018 Prognosis for Safe Diet Advancement Fair Barriers to Reach Goals Cognitive deficits CHL IP DIET RECOMMENDATION 07/25/2018 SLP Diet Recommendations Dysphagia 2 (Fine chop) solids;Nectar thick liquid Liquid Administration via No straw;Cup Medication Administration Crushed with puree Compensations Minimize environmental distractions;Small sips/bites;Slow rate Postural Changes Seated upright at 90 degrees;Remain semi-upright after after feeds/meals (Comment)   CHL IP OTHER RECOMMENDATIONS 07/25/2018 Oral Care Recommendations Oral care QID Other Recommendations Order thickener from pharmacy;Remove water pitcher   CHL IP FOLLOW UP RECOMMENDATIONS 07/25/2018 Follow up Recommendations SLP will follow acutely for assessment of diet tolerance and education.   CHL IP FREQUENCY AND DURATION 07/25/2018 Speech Therapy Frequency (ACUTE ONLY) min 2x/week Treatment Duration 1 week;2 weeks      CHL IP ORAL PHASE 07/25/2018 Oral Phase Impaired Oral - Nectar Cup, Straw Premature spillage;Decreased bolus cohesion Oral - Thin Cup, Straw Premature spillage;Decreased bolus cohesion Oral - Mech Soft Premature spillage;Decreased bolus cohesion Oral - Pill Reduced posterior propulsion;Piecemeal swallowing. Unable to clear pill from oral cavity    CHL IP PHARYNGEAL PHASE 07/25/2018 Pharyngeal Phase Impaired Pharyngeal- Nectar Cup, Straw Delayed  swallow initiation-vallecula;Reduced airway/laryngeal closure;Penetration/Aspiration during swallow Pharyngeal Material enters airway, remains ABOVE vocal cords then ejected out Pharyngeal- Thin Cup, Straw Delayed swallow initiation-vallecula;Reduced airway/laryngeal closure;Penetration/Aspiration during swallow Pharyngeal Material enters airway, CONTACTS cords and not ejected out;Material enters airway, remains ABOVE vocal cords and not ejected out;Material enters airway, passes BELOW cords without attempt by patient to eject  out (silent aspiration) Pharyngeal- Puree Delayed swallow initiation-vallecula Pharyngeal- Mechanical Soft Delayed swallow initiation-vallecula Pharyngeal- Pill Pt spit out pill, did not swallow.  CHL IP CERVICAL ESOPHAGEAL PHASE 07/25/2018 Cervical Esophageal Phase [facility] Celia B. Murvin [facility], [name] Speech Language Pathologist [phone] Leigh Aurora 07/25/2018, 1:16 PM                 Subjective: No issues today. Feels well.  Discharge Exam: Vitals:   07/25/18 2105 07/26/18 0557  BP: 105/71 101/75  Pulse: 71 70  Resp: 12 20  Temp: 97.6 F (36.4 C) 98.3 F (36.8 C)  SpO2: 97% 98%   Vitals:   07/25/18 0800 07/25/18 1608 07/25/18 2105 07/26/18 0557  BP:  108/72 105/71 101/75  Pulse:  61 71 70  Resp: 20 18 12 20   Temp: 98 F (36.7 C) 97.7 F (36.5 C) 97.6 F (36.4 C) 98.3 F (36.8 C)  TempSrc: Oral Oral  Oral  SpO2: 94% 95% 97% 98%  Weight:      Height:        General: Pt is alert, awake, not in acute distress    The results of significant diagnostics from this hospitalization (including imaging, microbiology, ancillary and laboratory) are listed below for reference.     Microbiology: Recent Results (from the past 240 hour(s))  Urine culture     Status: Abnormal   Collection Time: 07/24/18 10:50 AM  Result Value Ref Range Status   Specimen Description   Final    URINE, CLEAN CATCH Performed at [facility], [address], [place], [place] [address]    Special Requests   Final    NONE Performed at [facility], [address] [place], [place] [address]    Culture >=100,000 COLONIES/mL ENTEROBACTER AEROGENES (A)  Final   Report Status 07/26/2018 FINAL  Final   Organism ID, Bacteria ENTEROBACTER AEROGENES (A)  Final      Susceptibility   Enterobacter aerogenes - MIC*    CEFAZOLIN RESISTANT Resistant     CEFTRIAXONE <=1 SENSITIVE Sensitive     CIPROFLOXACIN <=0.25 SENSITIVE Sensitive     GENTAMICIN <=1 SENSITIVE  Sensitive     IMIPENEM <=0.25 SENSITIVE Sensitive     NITROFURANTOIN 128 RESISTANT Resistant     TRIMETH/SULFA <=20 SENSITIVE Sensitive     PIP/TAZO <=4 SENSITIVE Sensitive     * >=100,000 COLONIES/mL ENTEROBACTER AEROGENES     Labs: BNP (last 3 results) No results for input(s): BNP in the last 8760 hours. Basic Metabolic Panel: Recent Labs  Lab 07/24/18 1112 07/24/18 1252 07/25/18 0447  NA 142  --  140  K 2.9*  --  3.4*  CL 111  --  113*  CO2 22  --  21*  GLUCOSE 114*  --  202*  BUN 22  --  14  CREATININE 0.69  --  0.41*  CALCIUM 8.4*  --  7.9*  MG  --  1.7  --    Liver Function Tests: Recent Labs  Lab 07/24/18 1112  AST 15  ALT 8  ALKPHOS 93  BILITOT 0.5  PROT 5.9*  ALBUMIN 3.1*  No results for input(s): LIPASE, AMYLASE in the last 168 hours. Recent Labs  Lab 07/24/18 1112  AMMONIA 15   CBC: Recent Labs  Lab 07/24/18 1112 07/25/18 0447  WBC 7.6 5.7  NEUTROABS 4.3  --   HGB 12.8 12.2  HCT 36.9 35.3*  MCV 101.9* 101.1*  PLT 382 330   Cardiac Enzymes: No results for input(s): CKTOTAL, CKMB, CKMBINDEX, TROPONINI in the last 168 hours. BNP: Invalid input(s): POCBNP CBG: Recent Labs  Lab 07/24/18 1636  GLUCAP 112*   D-Dimer No results for input(s): DDIMER in the last 72 hours. Hgb A1c No results for input(s): HGBA1C in the last 72 hours. Lipid Profile No results for input(s): CHOL, HDL, LDLCALC, TRIG, CHOLHDL, LDLDIRECT in the last 72 hours. Thyroid function studies No results for input(s): TSH, T4TOTAL, T3FREE, THYROIDAB in the last 72 hours.  Invalid input(s): FREET3 Anemia work up No results for input(s): VITAMINB12, FOLATE, FERRITIN, TIBC, IRON, RETICCTPCT in the last 72 hours. Urinalysis    Component Value Date/Time   COLORURINE YELLOW 07/24/2018 1050   APPEARANCEUR CLEAR 07/24/2018 1050   LABSPEC 1.014 07/24/2018 1050   PHURINE 5.0 07/24/2018 1050   GLUCOSEU NEGATIVE 07/24/2018 1050   HGBUR SMALL (A) 07/24/2018 1050    BILIRUBINUR NEGATIVE 07/24/2018 1050   KETONESUR 5 (A) 07/24/2018 1050   PROTEINUR NEGATIVE 07/24/2018 1050   NITRITE POSITIVE (A) 07/24/2018 1050   LEUKOCYTESUR LARGE (A) 07/24/2018 1050   Sepsis Labs Invalid input(s): PROCALCITONIN,  WBC,  LACTICIDVEN Microbiology Recent Results (from the past 240 hour(s))  Urine culture     Status: Abnormal   Collection Time: 07/24/18 10:50 AM  Result Value Ref Range Status   Specimen Description   Final    URINE, CLEAN CATCH Performed at [facility], [address], [place], [place] [address]    Special Requests   Final    NONE Performed at [facility], [address] [place], [place] [address]    Culture >=100,000 COLONIES/mL ENTEROBACTER AEROGENES (A)  Final   Report Status 07/26/2018 FINAL  Final   Organism ID, Bacteria ENTEROBACTER AEROGENES (A)  Final      Susceptibility   Enterobacter aerogenes - MIC*    CEFAZOLIN RESISTANT Resistant     CEFTRIAXONE <=1 SENSITIVE Sensitive     CIPROFLOXACIN <=0.25 SENSITIVE Sensitive     GENTAMICIN <=1 SENSITIVE Sensitive     IMIPENEM <=0.25 SENSITIVE Sensitive     NITROFURANTOIN 128 RESISTANT Resistant     TRIMETH/SULFA <=20 SENSITIVE Sensitive     PIP/TAZO <=4 SENSITIVE Sensitive     * >=100,000 COLONIES/mL ENTEROBACTER AEROGENES    SIGNED:   Jacquelin Hawking, MD [facility] 07/26/2018, 10:53 AM

## 2018-07-27 DIAGNOSIS — K5900 Constipation, unspecified: Secondary | ICD-10-CM | POA: Diagnosis not present

## 2018-07-27 DIAGNOSIS — T886XXD Anaphylactic reaction due to adverse effect of correct drug or medicament properly administered, subsequent encounter: Secondary | ICD-10-CM | POA: Diagnosis not present

## 2018-07-27 DIAGNOSIS — R627 Adult failure to thrive: Secondary | ICD-10-CM | POA: Diagnosis not present

## 2018-07-27 DIAGNOSIS — E43 Unspecified severe protein-calorie malnutrition: Secondary | ICD-10-CM | POA: Diagnosis not present

## 2018-07-27 DIAGNOSIS — F419 Anxiety disorder, unspecified: Secondary | ICD-10-CM | POA: Diagnosis not present

## 2018-07-27 DIAGNOSIS — N390 Urinary tract infection, site not specified: Secondary | ICD-10-CM | POA: Diagnosis not present

## 2018-07-27 DIAGNOSIS — R1312 Dysphagia, oropharyngeal phase: Secondary | ICD-10-CM | POA: Diagnosis not present

## 2018-07-27 DIAGNOSIS — F1010 Alcohol abuse, uncomplicated: Secondary | ICD-10-CM | POA: Diagnosis not present

## 2018-07-27 DIAGNOSIS — M6281 Muscle weakness (generalized): Secondary | ICD-10-CM | POA: Diagnosis not present

## 2018-07-27 DIAGNOSIS — F329 Major depressive disorder, single episode, unspecified: Secondary | ICD-10-CM | POA: Diagnosis not present

## 2018-08-01 ENCOUNTER — Other Ambulatory Visit: Payer: Self-pay | Admitting: *Deleted

## 2018-08-01 NOTE — Patient Outreach (Signed)
[facility]) Care Management  08/01/2018  [name] 08/31/1946 [id number]   EMMI-       RED ON EMMI ALERT General discharge  Day # 4 Date: 07/31/18 1057 Red Alert Reason: sad/hopeless/anxious/empty? yes  Outreach attempt # 1 unsuccessful at the home number  [facility] RN CM left HIPAA compliant voicemail message along with CM's contact info with the female who answered and states the patient was "busy with someone right now"   Plan: [facility] CM sent an unsuccessful outreach letter and scheduled this patient for another call attempt within 4 business days  [name] L. Noelle PennerGibbs, RN, BSN, CCM [facility] Telephonic Care Management Care Coordinator Direct Number [phone] Mobile number [phone]  Main [facility] number [phone] Fax number [phone]

## 2018-08-02 ENCOUNTER — Other Ambulatory Visit: Payer: Self-pay | Admitting: *Deleted

## 2018-08-02 NOTE — Patient Outreach (Addendum)
[facility]) Care Management  08/02/2018  April PoissonDeborah S [name] 06/09/1946 [id number]   EMMI-                                                               RED ON EMMI ALERT General discharge  Day # 4 Date: 07/31/18 1057 Red Alert Reason: sad/hopeless/anxious/empty? yes  Outreach attempt # 2 successful at the home number  Patient is able to verify HIPAA [facility] RN reviewed and addressed red alert with patient Mrs April [name] reports the answer to the EMMI question was correct as yes She reports feeling not good after leaving the hospital and report she spoke with the [facility] staff who visited yesterday ? April [name], and who will also be visiting again Thursday 08/04/18 "we had a good long talk"  She also reports she is on medication for depression Mrs April [name] appreciates the call from [facility] RN CM [facility] CM reviewed [facility] services with patient but she denies need of services from [facility]/Telephonic RN CM, pharmacy or SW at this time   Conditions: anaphylaxis,  anxiety, depression, ETOH abuse, HTN, HLD, GERD  Advised patient that there will be further automated EMMI- post discharge calls to assess how the patient is doing following the recent hospitalization Advised the patient that another call may be received from a nurse if any of their responses were abnormal. Patient voiced understanding and was appreciative of f/u call.   Plan[facility] CM sent an unsuccessful outreach letter and scheduled this patient for another call attempt within 4 business days  April L. Noelle PennerGibbs, RN, BSN, CCM [facility] Telephonic Care Management Care Coordinator Direct Number [phone] Mobile number [phone]  Main [facility] number [phone] Fax number [phone]

## 2018-08-04 DIAGNOSIS — F419 Anxiety disorder, unspecified: Secondary | ICD-10-CM | POA: Diagnosis not present

## 2018-08-04 DIAGNOSIS — T782XXD Anaphylactic shock, unspecified, subsequent encounter: Secondary | ICD-10-CM | POA: Diagnosis not present

## 2018-08-04 DIAGNOSIS — N3001 Acute cystitis with hematuria: Secondary | ICD-10-CM | POA: Diagnosis not present

## 2018-08-23 DIAGNOSIS — R1030 Lower abdominal pain, unspecified: Secondary | ICD-10-CM | POA: Diagnosis not present

## 2018-08-30 DIAGNOSIS — R300 Dysuria: Secondary | ICD-10-CM | POA: Diagnosis not present

## 2018-09-26 DIAGNOSIS — H6122 Impacted cerumen, left ear: Secondary | ICD-10-CM | POA: Diagnosis not present

## 2018-09-27 DIAGNOSIS — B379 Candidiasis, unspecified: Secondary | ICD-10-CM | POA: Diagnosis not present

## 2018-09-27 DIAGNOSIS — Z23 Encounter for immunization: Secondary | ICD-10-CM | POA: Diagnosis not present

## 2018-09-27 DIAGNOSIS — R300 Dysuria: Secondary | ICD-10-CM | POA: Diagnosis not present

## 2018-10-12 DIAGNOSIS — R110 Nausea: Secondary | ICD-10-CM | POA: Diagnosis not present

## 2018-10-12 DIAGNOSIS — J3489 Other specified disorders of nose and nasal sinuses: Secondary | ICD-10-CM | POA: Diagnosis not present

## 2018-10-18 ENCOUNTER — Other Ambulatory Visit: Payer: Self-pay | Admitting: Physician Assistant

## 2018-10-18 ENCOUNTER — Ambulatory Visit
Admission: RE | Admit: 2018-10-18 | Discharge: 2018-10-18 | Disposition: A | Discharge status: Home / Self Care | Payer: PPO | Source: Ambulatory Visit | Attending: Physician Assistant | Admitting: Physician Assistant

## 2018-10-18 DIAGNOSIS — K5900 Constipation, unspecified: Secondary | ICD-10-CM | POA: Diagnosis not present

## 2018-10-18 DIAGNOSIS — R110 Nausea: Secondary | ICD-10-CM

## 2018-10-18 DIAGNOSIS — F1020 Alcohol dependence, uncomplicated: Secondary | ICD-10-CM | POA: Diagnosis not present

## 2018-12-09 ENCOUNTER — Other Ambulatory Visit: Payer: Self-pay | Admitting: Neurology

## 2018-12-27 DIAGNOSIS — R110 Nausea: Secondary | ICD-10-CM | POA: Diagnosis not present

## 2018-12-27 DIAGNOSIS — J22 Unspecified acute lower respiratory infection: Secondary | ICD-10-CM | POA: Diagnosis not present

## 2018-12-27 DIAGNOSIS — R630 Anorexia: Secondary | ICD-10-CM | POA: Diagnosis not present

## 2019-01-06 DIAGNOSIS — R112 Nausea with vomiting, unspecified: Secondary | ICD-10-CM | POA: Diagnosis not present

## 2019-01-06 DIAGNOSIS — R51 Headache: Secondary | ICD-10-CM | POA: Diagnosis not present

## 2019-01-06 DIAGNOSIS — R1030 Lower abdominal pain, unspecified: Secondary | ICD-10-CM | POA: Diagnosis not present

## 2019-01-06 DIAGNOSIS — I959 Hypotension, unspecified: Secondary | ICD-10-CM | POA: Diagnosis not present

## 2019-01-23 ENCOUNTER — Other Ambulatory Visit: Payer: Self-pay | Admitting: Physician Assistant

## 2019-01-23 DIAGNOSIS — R1030 Lower abdominal pain, unspecified: Secondary | ICD-10-CM

## 2019-01-24 ENCOUNTER — Other Ambulatory Visit: Payer: PPO

## 2019-01-24 DIAGNOSIS — R110 Nausea: Secondary | ICD-10-CM | POA: Diagnosis not present

## 2019-01-24 DIAGNOSIS — E46 Unspecified protein-calorie malnutrition: Secondary | ICD-10-CM | POA: Diagnosis not present

## 2019-01-24 DIAGNOSIS — D72829 Elevated white blood cell count, unspecified: Secondary | ICD-10-CM | POA: Diagnosis not present

## 2019-01-24 DIAGNOSIS — R630 Anorexia: Secondary | ICD-10-CM | POA: Diagnosis not present

## 2019-01-31 ENCOUNTER — Ambulatory Visit
Admission: RE | Admit: 2019-01-31 | Discharge: 2019-01-31 | Disposition: A | Discharge status: Home / Self Care | Payer: PPO | Source: Ambulatory Visit | Attending: Physician Assistant | Admitting: Physician Assistant

## 2019-01-31 ENCOUNTER — Inpatient Hospital Stay: Admission: RE | Admit: 2019-01-31 | Payer: PPO | Source: Ambulatory Visit

## 2019-01-31 DIAGNOSIS — K5730 Diverticulosis of large intestine without perforation or abscess without bleeding: Secondary | ICD-10-CM | POA: Diagnosis not present

## 2019-01-31 DIAGNOSIS — R1030 Lower abdominal pain, unspecified: Secondary | ICD-10-CM

## 2019-01-31 MED ORDER — IOPAMIDOL (ISOVUE-300) INJECTION 61%
100.0000 mL | Freq: Once | INTRAVENOUS | Status: AC | PRN
  Administered 2019-01-31: 100 mL via INTRAVENOUS

## 2019-03-03 DIAGNOSIS — R627 Adult failure to thrive: Secondary | ICD-10-CM | POA: Diagnosis not present

## 2019-03-03 DIAGNOSIS — R110 Nausea: Secondary | ICD-10-CM | POA: Diagnosis not present

## 2019-03-03 DIAGNOSIS — R1013 Epigastric pain: Secondary | ICD-10-CM | POA: Diagnosis not present

## 2019-03-03 DIAGNOSIS — R5383 Other fatigue: Secondary | ICD-10-CM | POA: Diagnosis not present

## 2019-03-03 DIAGNOSIS — E46 Unspecified protein-calorie malnutrition: Secondary | ICD-10-CM | POA: Diagnosis not present

## 2019-03-21 ENCOUNTER — Inpatient Hospital Stay (HOSPITAL_COMMUNITY)
Admission: EM | Admit: 2019-03-21 | Discharge: 2019-03-24 | DRG: 481 | Disposition: A | Discharge status: Home Health | Payer: PPO | Attending: Internal Medicine | Admitting: Internal Medicine

## 2019-03-21 ENCOUNTER — Other Ambulatory Visit: Payer: Self-pay

## 2019-03-21 ENCOUNTER — Emergency Department (HOSPITAL_COMMUNITY): Payer: PPO

## 2019-03-21 DIAGNOSIS — Z888 Allergy status to other drugs, medicaments and biological substances status: Secondary | ICD-10-CM

## 2019-03-21 DIAGNOSIS — R1310 Dysphagia, unspecified: Secondary | ICD-10-CM | POA: Diagnosis not present

## 2019-03-21 DIAGNOSIS — R402252 Coma scale, best verbal response, oriented, at arrival to emergency department: Secondary | ICD-10-CM | POA: Diagnosis not present

## 2019-03-21 DIAGNOSIS — K219 Gastro-esophageal reflux disease without esophagitis: Secondary | ICD-10-CM | POA: Diagnosis not present

## 2019-03-21 DIAGNOSIS — R636 Underweight: Secondary | ICD-10-CM | POA: Diagnosis not present

## 2019-03-21 DIAGNOSIS — F1010 Alcohol abuse, uncomplicated: Secondary | ICD-10-CM | POA: Diagnosis not present

## 2019-03-21 DIAGNOSIS — W1830XA Fall on same level, unspecified, initial encounter: Secondary | ICD-10-CM | POA: Diagnosis present

## 2019-03-21 DIAGNOSIS — S72002A Fracture of unspecified part of neck of left femur, initial encounter for closed fracture: Secondary | ICD-10-CM | POA: Diagnosis not present

## 2019-03-21 DIAGNOSIS — Z7982 Long term (current) use of aspirin: Secondary | ICD-10-CM | POA: Diagnosis not present

## 2019-03-21 DIAGNOSIS — Z87891 Personal history of nicotine dependence: Secondary | ICD-10-CM | POA: Diagnosis not present

## 2019-03-21 DIAGNOSIS — M25552 Pain in left hip: Secondary | ICD-10-CM | POA: Diagnosis not present

## 2019-03-21 DIAGNOSIS — G629 Polyneuropathy, unspecified: Secondary | ICD-10-CM | POA: Diagnosis present

## 2019-03-21 DIAGNOSIS — Z8249 Family history of ischemic heart disease and other diseases of the circulatory system: Secondary | ICD-10-CM

## 2019-03-21 DIAGNOSIS — E7800 Pure hypercholesterolemia, unspecified: Secondary | ICD-10-CM | POA: Diagnosis not present

## 2019-03-21 DIAGNOSIS — R531 Weakness: Secondary | ICD-10-CM | POA: Diagnosis present

## 2019-03-21 DIAGNOSIS — R9431 Abnormal electrocardiogram [ECG] [EKG]: Secondary | ICD-10-CM | POA: Diagnosis present

## 2019-03-21 DIAGNOSIS — Y92009 Unspecified place in unspecified non-institutional (private) residence as the place of occurrence of the external cause: Secondary | ICD-10-CM | POA: Diagnosis not present

## 2019-03-21 DIAGNOSIS — D62 Acute posthemorrhagic anemia: Secondary | ICD-10-CM | POA: Diagnosis not present

## 2019-03-21 DIAGNOSIS — Z681 Body mass index (BMI) 19 or less, adult: Secondary | ICD-10-CM

## 2019-03-21 DIAGNOSIS — S72142A Displaced intertrochanteric fracture of left femur, initial encounter for closed fracture: Secondary | ICD-10-CM | POA: Diagnosis not present

## 2019-03-21 DIAGNOSIS — R64 Cachexia: Secondary | ICD-10-CM | POA: Diagnosis present

## 2019-03-21 DIAGNOSIS — Z7984 Long term (current) use of oral hypoglycemic drugs: Secondary | ICD-10-CM | POA: Diagnosis not present

## 2019-03-21 DIAGNOSIS — I959 Hypotension, unspecified: Secondary | ICD-10-CM | POA: Diagnosis not present

## 2019-03-21 DIAGNOSIS — F419 Anxiety disorder, unspecified: Secondary | ICD-10-CM | POA: Diagnosis not present

## 2019-03-21 DIAGNOSIS — W1839XA Other fall on same level, initial encounter: Secondary | ICD-10-CM | POA: Diagnosis not present

## 2019-03-21 DIAGNOSIS — F17210 Nicotine dependence, cigarettes, uncomplicated: Secondary | ICD-10-CM | POA: Diagnosis present

## 2019-03-21 DIAGNOSIS — Z419 Encounter for procedure for purposes other than remedying health state, unspecified: Secondary | ICD-10-CM

## 2019-03-21 DIAGNOSIS — S72102A Unspecified trochanteric fracture of left femur, initial encounter for closed fracture: Secondary | ICD-10-CM | POA: Diagnosis not present

## 2019-03-21 DIAGNOSIS — G4730 Sleep apnea, unspecified: Secondary | ICD-10-CM | POA: Diagnosis not present

## 2019-03-21 DIAGNOSIS — Z79891 Long term (current) use of opiate analgesic: Secondary | ICD-10-CM | POA: Diagnosis not present

## 2019-03-21 DIAGNOSIS — Z823 Family history of stroke: Secondary | ICD-10-CM | POA: Diagnosis not present

## 2019-03-21 DIAGNOSIS — Z79899 Other long term (current) drug therapy: Secondary | ICD-10-CM | POA: Diagnosis not present

## 2019-03-21 DIAGNOSIS — M25551 Pain in right hip: Secondary | ICD-10-CM | POA: Diagnosis present

## 2019-03-21 DIAGNOSIS — I25110 Atherosclerotic heart disease of native coronary artery with unstable angina pectoris: Secondary | ICD-10-CM | POA: Diagnosis not present

## 2019-03-21 DIAGNOSIS — E785 Hyperlipidemia, unspecified: Secondary | ICD-10-CM | POA: Diagnosis not present

## 2019-03-21 DIAGNOSIS — R42 Dizziness and giddiness: Secondary | ICD-10-CM | POA: Diagnosis present

## 2019-03-21 DIAGNOSIS — R52 Pain, unspecified: Secondary | ICD-10-CM | POA: Diagnosis not present

## 2019-03-21 DIAGNOSIS — R402362 Coma scale, best motor response, obeys commands, at arrival to emergency department: Secondary | ICD-10-CM | POA: Diagnosis not present

## 2019-03-21 DIAGNOSIS — Z0389 Encounter for observation for other suspected diseases and conditions ruled out: Secondary | ICD-10-CM | POA: Diagnosis not present

## 2019-03-21 DIAGNOSIS — I6523 Occlusion and stenosis of bilateral carotid arteries: Secondary | ICD-10-CM | POA: Diagnosis not present

## 2019-03-21 DIAGNOSIS — R627 Adult failure to thrive: Secondary | ICD-10-CM | POA: Diagnosis not present

## 2019-03-21 DIAGNOSIS — I471 Supraventricular tachycardia: Secondary | ICD-10-CM | POA: Diagnosis not present

## 2019-03-21 DIAGNOSIS — S7292XA Unspecified fracture of left femur, initial encounter for closed fracture: Secondary | ICD-10-CM | POA: Diagnosis not present

## 2019-03-21 DIAGNOSIS — Z1231 Encounter for screening mammogram for malignant neoplasm of breast: Secondary | ICD-10-CM | POA: Diagnosis not present

## 2019-03-21 DIAGNOSIS — R402142 Coma scale, eyes open, spontaneous, at arrival to emergency department: Secondary | ICD-10-CM | POA: Diagnosis present

## 2019-03-21 DIAGNOSIS — S72009A Fracture of unspecified part of neck of unspecified femur, initial encounter for closed fracture: Secondary | ICD-10-CM | POA: Diagnosis present

## 2019-03-21 DIAGNOSIS — I973 Postprocedural hypertension: Secondary | ICD-10-CM | POA: Diagnosis not present

## 2019-03-21 DIAGNOSIS — E876 Hypokalemia: Secondary | ICD-10-CM | POA: Diagnosis present

## 2019-03-21 DIAGNOSIS — I10 Essential (primary) hypertension: Secondary | ICD-10-CM | POA: Diagnosis present

## 2019-03-21 DIAGNOSIS — Z880 Allergy status to penicillin: Secondary | ICD-10-CM

## 2019-03-21 DIAGNOSIS — E119 Type 2 diabetes mellitus without complications: Secondary | ICD-10-CM | POA: Diagnosis not present

## 2019-03-21 DIAGNOSIS — S299XXA Unspecified injury of thorax, initial encounter: Secondary | ICD-10-CM | POA: Diagnosis not present

## 2019-03-21 DIAGNOSIS — W19XXXA Unspecified fall, initial encounter: Secondary | ICD-10-CM | POA: Diagnosis not present

## 2019-03-21 DIAGNOSIS — Z8719 Personal history of other diseases of the digestive system: Secondary | ICD-10-CM | POA: Diagnosis not present

## 2019-03-21 LAB — COMPREHENSIVE METABOLIC PANEL
ALT: 8 U/L (ref 0–44)
AST: 13 U/L — ABNORMAL LOW (ref 15–41)
Albumin: 2 g/dL — ABNORMAL LOW (ref 3.5–5.0)
Alkaline Phosphatase: 55 U/L (ref 38–126)
Anion gap: 7 (ref 5–15)
BUN: 7 mg/dL — ABNORMAL LOW (ref 8–23)
CO2: 16 mmol/L — ABNORMAL LOW (ref 22–32)
Calcium: 5.3 mg/dL — CL (ref 8.9–10.3)
Chloride: 119 mmol/L — ABNORMAL HIGH (ref 98–111)
Creatinine, Ser: <0.3 mg/dL — ABNORMAL LOW (ref 0.44–1.00)
Glucose, Bld: 96 mg/dL (ref 70–99)
Potassium: <2 mmol/L — CL (ref 3.5–5.1)
Sodium: 142 mmol/L (ref 135–145)
Total Bilirubin: 0.2 mg/dL — ABNORMAL LOW (ref 0.3–1.2)
Total Protein: 4.1 g/dL — ABNORMAL LOW (ref 6.5–8.1)

## 2019-03-21 LAB — CBC WITH DIFFERENTIAL/PLATELET
Abs Immature Granulocytes: 0.04 10*3/uL (ref 0.00–0.07)
Basophils Absolute: 0 10*3/uL (ref 0.0–0.1)
Basophils Relative: 0 %
Eosinophils Absolute: 0 10*3/uL (ref 0.0–0.5)
Eosinophils Relative: 0 %
HCT: 32.9 % — ABNORMAL LOW (ref 36.0–46.0)
Hemoglobin: 11.5 g/dL — ABNORMAL LOW (ref 12.0–15.0)
Immature Granulocytes: 1 %
Lymphocytes Relative: 9 %
Lymphs Abs: 0.8 10*3/uL (ref 0.7–4.0)
MCH: 37.1 pg — ABNORMAL HIGH (ref 26.0–34.0)
MCHC: 35 g/dL (ref 30.0–36.0)
MCV: 106.1 fL — ABNORMAL HIGH (ref 80.0–100.0)
Monocytes Absolute: 0.8 10*3/uL (ref 0.1–1.0)
Monocytes Relative: 9 %
Neutro Abs: 7 10*3/uL (ref 1.7–7.7)
Neutrophils Relative %: 81 %
Platelets: 210 10*3/uL (ref 150–400)
RBC: 3.1 MIL/uL — ABNORMAL LOW (ref 3.87–5.11)
RDW: 14.1 % (ref 11.5–15.5)
WBC: 8.7 10*3/uL (ref 4.0–10.5)
nRBC: 0 % (ref 0.0–0.2)

## 2019-03-21 LAB — URINALYSIS, ROUTINE W REFLEX MICROSCOPIC
Bacteria, UA: NONE SEEN
Bilirubin Urine: NEGATIVE
Glucose, UA: NEGATIVE mg/dL
Ketones, ur: 5 mg/dL — AB
Leukocytes,Ua: NEGATIVE
Nitrite: NEGATIVE
Protein, ur: NEGATIVE mg/dL
Specific Gravity, Urine: 1.006 (ref 1.005–1.030)
pH: 6 (ref 5.0–8.0)

## 2019-03-21 LAB — MAGNESIUM: Magnesium: 1.2 mg/dL — ABNORMAL LOW (ref 1.7–2.4)

## 2019-03-21 MED ORDER — METHOCARBAMOL 500 MG PO TABS
500.0000 mg | ORAL_TABLET | Freq: Four times a day (QID) | ORAL | Status: DC | PRN
  Administered 2019-03-21: 500 mg via ORAL
  Filled 2019-03-21 (×2): qty 1

## 2019-03-21 MED ORDER — DOCUSATE SODIUM 100 MG PO CAPS
100.0000 mg | ORAL_CAPSULE | Freq: Two times a day (BID) | ORAL | Status: DC
  Administered 2019-03-22 – 2019-03-24 (×4): 100 mg via ORAL
  Filled 2019-03-21 (×5): qty 1

## 2019-03-21 MED ORDER — SODIUM CHLORIDE 0.9 % IV SOLN: 1.0000 g | Freq: Once | INTRAVENOUS | Status: DC

## 2019-03-21 MED ORDER — CLONAZEPAM 0.5 MG PO TABS
0.5000 mg | ORAL_TABLET | Freq: Two times a day (BID) | ORAL | Status: DC | PRN
  Administered 2019-03-21: 0.5 mg via ORAL
  Filled 2019-03-21: qty 1

## 2019-03-21 MED ORDER — POLYETHYLENE GLYCOL 3350 17 G PO PACK
17.0000 g | PACK | Freq: Every day | ORAL | Status: DC
  Administered 2019-03-23: 09:00:00 17 g via ORAL
  Filled 2019-03-21 (×2): qty 1

## 2019-03-21 MED ORDER — MORPHINE SULFATE (PF) 4 MG/ML IV SOLN
4.0000 mg | Freq: Once | INTRAVENOUS | Status: AC
  Administered 2019-03-21: 18:00:00 4 mg via INTRAVENOUS
  Filled 2019-03-21: qty 1

## 2019-03-21 MED ORDER — SODIUM CHLORIDE 0.9 % IV SOLN
INTRAVENOUS | Status: DC
  Administered 2019-03-21 – 2019-03-22 (×2): via INTRAVENOUS

## 2019-03-21 MED ORDER — POTASSIUM CHLORIDE 10 MEQ/100ML IV SOLN
10.0000 meq | INTRAVENOUS | Status: AC
  Administered 2019-03-21 – 2019-03-22 (×3): 10 meq via INTRAVENOUS
  Filled 2019-03-21 (×2): qty 100

## 2019-03-21 MED ORDER — METHOCARBAMOL 1000 MG/10ML IJ SOLN
500.0000 mg | Freq: Four times a day (QID) | INTRAVENOUS | Status: DC | PRN
  Filled 2019-03-21: qty 5

## 2019-03-21 MED ORDER — SODIUM CHLORIDE 0.9 % IV SOLN
INTRAVENOUS | Status: DC
  Administered 2019-03-21: 18:00:00 via INTRAVENOUS

## 2019-03-21 MED ORDER — MIRTAZAPINE 15 MG PO TABS
7.5000 mg | ORAL_TABLET | Freq: Every day | ORAL | Status: DC
  Administered 2019-03-22 – 2019-03-23 (×2): 7.5 mg via ORAL
  Filled 2019-03-21 (×3): qty 1

## 2019-03-21 MED ORDER — CALCIUM GLUCONATE-NACL 1-0.675 GM/50ML-% IV SOLN
1.0000 g | Freq: Once | INTRAVENOUS | Status: AC
  Administered 2019-03-21: 1000 mg via INTRAVENOUS
  Filled 2019-03-21: qty 50

## 2019-03-21 MED ORDER — POTASSIUM CHLORIDE CRYS ER 20 MEQ PO TBCR
60.0000 meq | EXTENDED_RELEASE_TABLET | Freq: Once | ORAL | Status: AC
  Administered 2019-03-21: 20:00:00 60 meq via ORAL
  Filled 2019-03-21: qty 3

## 2019-03-21 MED ORDER — ENOXAPARIN SODIUM 40 MG/0.4ML ~~LOC~~ SOLN
40.0000 mg | SUBCUTANEOUS | Status: DC
  Administered 2019-03-22 – 2019-03-23 (×2): 40 mg via SUBCUTANEOUS
  Filled 2019-03-21 (×3): qty 0.4

## 2019-03-21 MED ORDER — POTASSIUM CHLORIDE 10 MEQ/100ML IV SOLN
10.0000 meq | Freq: Once | INTRAVENOUS | Status: AC
  Administered 2019-03-21: 20:00:00 10 meq via INTRAVENOUS
  Filled 2019-03-21: qty 100

## 2019-03-21 MED ORDER — HYDROCODONE-ACETAMINOPHEN 5-325 MG PO TABS
1.0000 | ORAL_TABLET | Freq: Four times a day (QID) | ORAL | Status: DC | PRN
  Administered 2019-03-21 – 2019-03-23 (×2): 1 via ORAL
  Filled 2019-03-21 (×2): qty 1

## 2019-03-21 MED ORDER — MORPHINE SULFATE (PF) 2 MG/ML IV SOLN
0.5000 mg | INTRAVENOUS | Status: DC | PRN
  Administered 2019-03-22: 0.5 mg via INTRAVENOUS
  Filled 2019-03-21: qty 1

## 2019-03-21 NOTE — ED Notes (Signed)
Patient transported to X-ray 

## 2019-03-21 NOTE — ED Notes (Signed)
Pt resting. Monitor readjusted. Meds given per MAR. VS rechecked. NAD

## 2019-03-21 NOTE — ED Triage Notes (Signed)
Pt BIBA from home c/o mechanical fall x3.  Last fall was on left hip. - Pt c/o nausea, lack of appetite x3 days. Pt lives with family.   Ambulatory at baseline.  AOx4.    1500 mL NaCL and 4 mg Zofran given by EMS.

## 2019-03-21 NOTE — ED Notes (Signed)
CRITICAL VALUE STICKER  CRITICAL VALUE: Ca: 5.3 & K: >2.0  DATE & TIME NOTIFIED: 03/21/19 1923  MD NOTIFIED: Freida Busman MD  TIME OF NOTIFICATION: [phone]

## 2019-03-21 NOTE — ED Notes (Signed)
Pt reports she fell Sunday 4/5, Monday 4/6 and Today 4/7.   Pt is experiencing worsening pain on the left side in her hip.

## 2019-03-21 NOTE — ED Notes (Signed)
Bed: LI10 Expected date:  Expected time:  Means of arrival:  Comments: EMS 89F hip fx

## 2019-03-21 NOTE — H&P (Addendum)
TRH H&P    Patient Demographics:    April [name], is a 73 y.o. female  MRN: [id number]  DOB - 11/12/1946  Admit Date - 03/21/2019  Referring MD/NP/PA: Dr. Freida Busman  Outpatient Primary MD for the patient is Richmond Campbell., PA-C  Patient coming from: Home  Chief complaint-fall   HPI:    April [name]  is a 73 y.o. female, with history of hypertension, hyperlipidemia, anxiety who came to hospital after patient fell at home.  Patient states that she had 3 falls in last 3 days.  She became dizzy and lightheaded and then fell onto her left side.  She denies hitting her head.  Denies chest pain or shortness of breath.  No palpitations.  No nausea vomiting or diarrhea. In the ED, x-ray of the hip showed left intertrochanteric femur fracture. ED physician consulted orthopedic surgeon. Patient denies dysuria or abdominal pain. No history of CAD No previous history of stroke or seizures.    Review of systems:    In addition to the HPI above,   All other systems reviewed and are negative.    Past History of the following :    Past Medical History:  Diagnosis Date  . Anxiety   . Current smoker   . Depression   . ETOH abuse   . GERD (gastroesophageal reflux disease)   . History of shingles summer 2015  . HTN (hypertension)   . Hyperlipidemia   . Hypertension   . Leg edema   . Neuropathy   . Neuropathy   . Pancreatitis       Past Surgical History:  Procedure Laterality Date  . ERCP N/A 02/01/2017   Procedure: ENDOSCOPIC RETROGRADE CHOLANGIOPANCREATOGRAPHY (ERCP);  Surgeon: Dorena Cookey, MD;  Location: [facility];  Service: Endoscopy;  Laterality: N/A;  . LAPAROSCOPIC CHOLECYSTECTOMY WITH COMMON DUCT EXPLORATION AND INTRAOP CHOLANGIOGRAM N/A 02/03/2017   Procedure: LAPAROSCOPIC CHOLECYSTECTOMY WITH POSSIBLE COMMON DUCT EXPLORATION AND INTRAOP CHOLANGIOGRAM;  Surgeon: Ovidio Kin, MD;  Location: [facility];  Service: General;  Laterality: N/A;  . removal of uterine fibroids  2004      Social History:      Social History   Tobacco Use  . Smoking status: Current Every Day Smoker    Packs/day: 1.00    Types: Cigarettes  . Smokeless tobacco: Never Used  Substance Use Topics  . Alcohol use: Yes    Alcohol/week: 14.0 standard drinks    Types: 14 Shots of liquor per week    Comment: states., "I keep a glass with me", reports drinking today (04/09/17)       Family History :     Family History  Problem Relation Age of Onset  . Heart disease Father   . Neuropathy Neg Hx       Home Medications:   Prior to Admission medications   Medication Sig Start Date End Date Taking? Authorizing Provider  amitriptyline (ELAVIL) 25 MG tablet Take 25 mg by mouth at bedtime as needed for sleep.  08/27/16   [provider]  clonazePAM (  KLONOPIN) 0.5 MG tablet Take 0.5 mg by mouth 2 (two) times daily as needed for anxiety. as directed 08/05/17   [provider]  feeding supplement, ENSURE ENLIVE, (ENSURE ENLIVE) LIQD Take 237 mLs by mouth 2 (two) times daily between meals. 07/26/18   Narda BondsNettey, Ralph A, MD  Maltodextrin-Xanthan Gum (RESOURCE THICKENUP CLEAR) POWD Nectar thick consistency for fluid intake 07/26/18   Narda BondsNettey, Ralph A, MD  mirtazapine (REMERON) 7.5 MG tablet Take 7.5 mg by mouth at bedtime. 04/14/18   [provider]  Multiple Vitamin (MULTIVITAMIN WITH MINERALS) TABS tablet Take 1 tablet by mouth daily. 04/13/17   Penny PiaVega, Orlando, MD  polyethylene glycol [facility](MIRALAX / Ethelene HalGLYCOLAX) packet Take 17 g by mouth daily. 07/27/18   Narda BondsNettey, Ralph A, MD  potassium chloride (K-DUR,KLOR-CON) 10 MEQ tablet Take 10 mEq by mouth 2 (two) times daily.    [provider]     Allergies:     Allergies  Allergen Reactions  . Naltrexone Shortness Of Breath  . Rocephin [Ceftriaxone Sodium In Dextrose] Anaphylaxis  . Penicillins Rash    Has patient had a PCN reaction causing  immediate rash, facial/tongue/throat swelling, SOB or lightheadedness with hypotension: Yes immediate rash  Has patient had a PCN reaction causing severe rash involving mucus membranes or skin necrosis: Yes Has patient had a PCN reaction that required hospitalization": No Has patient had a PCN reaction occurring within the last 10 years: Yes If all of the above answers are "NO", then may proceed with Cephalosporin use.      Physical Exam:   Vitals  Blood pressure 119/82, pulse 94, temperature 98.2 F (36.8 C), resp. rate (!) 23, height 5\' 6"  (1.676 m), weight 49.9 kg, SpO2 95 %.  1.  General: Appears in no acute distress  2. Psychiatric: Alert, oriented x3, intact insight and judgment  3. Neurologic: Cranial nerves II - 12 grossly intact, motor strength 5/5 in all extremities.  4. HEENMT:  Atraumatic normocephalic, extraocular muscles are intact  5. Respiratory : Clear to auscultation bilaterally, no wheezing or crackles.  6. Cardiovascular : S1-S2, regular, no murmur auscultated  7. Gastrointestinal:  Abdomen is soft, nontender, no organomegaly  8. Skin:  No rashes noted on the skin  9.Musculoskeletal:  Left lower extremity is externally rotated, tenderness noted at left hip.    Data Review:    CBC Recent Labs  Lab 03/21/19 1833  WBC 8.7  HGB 11.5*  HCT 32.9*  PLT 210  MCV 106.1*  MCH 37.1*  MCHC 35.0  RDW 14.1  LYMPHSABS 0.8  MONOABS 0.8  EOSABS 0.0  BASOSABS 0.0   ------------------------------------------------------------------------------------------------------------------  Results for orders placed or performed during the hospital encounter of 03/21/19 (from the past 48 hour(s))  Urinalysis, Routine w reflex microscopic     Status: Abnormal   Collection Time: 03/21/19  6:15 PM  Result Value Ref Range   Color, Urine YELLOW YELLOW   APPearance CLEAR CLEAR   Specific Gravity, Urine 1.006 1.005 - 1.030   pH 6.0 5.0 - 8.0   Glucose, UA  NEGATIVE NEGATIVE mg/dL   Hgb urine dipstick SMALL (A) NEGATIVE   Bilirubin Urine NEGATIVE NEGATIVE   Ketones, ur 5 (A) NEGATIVE mg/dL   Protein, ur NEGATIVE NEGATIVE mg/dL   Nitrite NEGATIVE NEGATIVE   Leukocytes,Ua NEGATIVE NEGATIVE   RBC / HPF 0-5 0 - 5 RBC/hpf   WBC, UA 0-5 0 - 5 WBC/hpf   Bacteria, UA NONE SEEN NONE SEEN   Squamous Epithelial / LPF  0-5 0 - 5    Comment: Performed at [facility], [address], [place], [place] [address]  CBC with Differential/Platelet     Status: Abnormal   Collection Time: 03/21/19  6:33 PM  Result Value Ref Range   WBC 8.7 4.0 - 10.5 K/uL   RBC 3.10 (L) 3.87 - 5.11 MIL/uL   Hemoglobin 11.5 (L) 12.0 - 15.0 g/dL   HCT 60.4 (L) 54.0 - 98.1 %   MCV 106.1 (H) 80.0 - 100.0 fL   MCH 37.1 (H) 26.0 - 34.0 pg   MCHC 35.0 30.0 - 36.0 g/dL   RDW 19.1 47.8 - 29.5 %   Platelets 210 150 - 400 K/uL   nRBC 0.0 0.0 - 0.2 %   Neutrophils Relative % 81 %   Neutro Abs 7.0 1.7 - 7.7 K/uL   Lymphocytes Relative 9 %   Lymphs Abs 0.8 0.7 - 4.0 K/uL   Monocytes Relative 9 %   Monocytes Absolute 0.8 0.1 - 1.0 K/uL   Eosinophils Relative 0 %   Eosinophils Absolute 0.0 0.0 - 0.5 K/uL   Basophils Relative 0 %   Basophils Absolute 0.0 0.0 - 0.1 K/uL   Immature Granulocytes 1 %   Abs Immature Granulocytes 0.04 0.00 - 0.07 K/uL    Comment: Performed at [facility], [address], [place], [place] [address]  Comprehensive metabolic panel     Status: Abnormal   Collection Time: 03/21/19  6:33 PM  Result Value Ref Range   Sodium 142 135 - 145 mmol/L   Potassium <2.0 (LL) 3.5 - 5.1 mmol/L    Comment: REPEATED TO VERIFY CRITICAL RESULT CALLED TO, READ BACK BY AND VERIFIED WITH: C.FRANKLIN AT 1923 ON 03/21/19 BY N.THOMPSON    Chloride 119 (H) 98 - 111 mmol/L   CO2 16 (L) 22 - 32 mmol/L   Glucose, Bld 96 70 - 99 mg/dL   BUN 7 (L) 8 - 23 mg/dL   Creatinine, Ser <8.65 (L) 0.44 - 1.00 mg/dL   Calcium 5.3 (LL) 8.9 - 10.3 mg/dL     Comment: CRITICAL RESULT CALLED TO, READ BACK BY AND VERIFIED WITH: C.FRANKLIN AT 1923 ON 03/21/19 BY N.THOMPSON    Total Protein 4.1 (L) 6.5 - 8.1 g/dL   Albumin 2.0 (L) 3.5 - 5.0 g/dL   AST 13 (L) 15 - 41 U/L   ALT 8 0 - 44 U/L   Alkaline Phosphatase 55 38 - 126 U/L   Total Bilirubin 0.2 (L) 0.3 - 1.2 mg/dL   GFR calc non Af Amer NOT CALCULATED >60 mL/min   GFR calc Af Amer NOT CALCULATED >60 mL/min   Anion gap 7 5 - 15    Comment: Performed at [facility], [address] [place], [place] [address]    Chemistries  Recent Labs  Lab 03/21/19 1833  NA 142  K <2.0*  CL 119*  CO2 16*  GLUCOSE 96  BUN 7*  CREATININE <0.30*  CALCIUM 5.3*  AST 13*  ALT 8  ALKPHOS 55  BILITOT 0.2*   ------------------------------------------------------------------------------------------------------------------  ------------------------------------------------------------------------------------------------------------------ GFR: CrCl cannot be calculated (This lab value cannot be used to calculate CrCl because it is not a number: <0.30). Liver Function Tests: Recent Labs  Lab 03/21/19 1833  AST 13*  ALT 8  ALKPHOS 55  BILITOT 0.2*  PROT 4.1*  ALBUMIN 2.0*    --------------------------------------------------------------------------------------------------------------- Urine analysis:    Component Value Date/Time   COLORURINE YELLOW 03/21/2019 1815  APPEARANCEUR CLEAR 03/21/2019 1815   LABSPEC 1.006 03/21/2019 1815   PHURINE 6.0 03/21/2019 1815   GLUCOSEU NEGATIVE 03/21/2019 1815   HGBUR SMALL (A) 03/21/2019 1815   BILIRUBINUR NEGATIVE 03/21/2019 1815   KETONESUR 5 (A) 03/21/2019 1815   PROTEINUR NEGATIVE 03/21/2019 1815   NITRITE NEGATIVE 03/21/2019 1815   LEUKOCYTESUR NEGATIVE 03/21/2019 1815      Imaging Results:    Dg Chest 1 View  Result Date: 03/21/2019 CLINICAL DATA:  Fall EXAM: CHEST  1 VIEW COMPARISON:  07/24/2018 FINDINGS: Heart size and  vascularity normal. Atherosclerotic aortic arch. Lungs are clear without infiltrate or effusion. No pneumothorax. Apical pleural scarring bilaterally. IMPRESSION: No active disease. Electronically Signed   By: Marlan Palau M.D.   On: 03/21/2019 18:00   Dg Hip Unilat W Or Wo Pelvis 2-3 Views Left  Result Date: 03/21/2019 CLINICAL DATA:  Multiple recent falls. EXAM: DG HIP (WITH OR WITHOUT PELVIS) 2-3V LEFT COMPARISON:  CT abdomen and pelvis 01/31/2019. Bilateral hip radiographs 04/10/2018. FINDINGS: There is an intertrochanteric fracture of the left femur with at most minimal displacement. No hip dislocation is evident. Atherosclerotic vascular calcifications are noted. Speckled calcification in the pelvis corresponds to uterine fibroid on CT. IMPRESSION: Left intertrochanteric femur fracture. Electronically Signed   By: Sebastian Ache M.D.   On: 03/21/2019 17:56    My personal review of EKG: Rhythm NSR, QTC 536   Assessment & Plan:    Active Problems:   Hip fracture (HCC)   1. Left intertrochanteric femur fracture-we will admit the patient, initiate hip fracture protocol.  Keep her n.p.o.  Orthopedic surgery has been consulted.  Pain control with morphine/Vicodin.  2. Hypokalemia-patient presented with severe hypokalemia with potassium less than 2.0.  Will check serum magnesium level.  Potassium being replaced in the ED.  Will give additional KCl 10 mEq IV x3.  3. Hypocalcemia-calcium level is 5.3, patient's albumin is 2.0.  Corrected calcium is 6.9.  Patient given calcium gluconate in the ED.  Follow serum calcium level in a.m.  4. Prolonged QTC-likely from severe metabolic abnormalities including hypokalemia, hypocalcemia.  Will check serum magnesium level.  Monitor closely on telemetry.    DVT Prophylaxis-   Lovenox   AM Labs Ordered, also please review Full Orders  Family Communication: Admission, patients condition and plan of care including tests being ordered have been discussed  with the patient  who indicate understanding and agree with the plan and Code Status.  Code Status: Full code  Admission status: Inpatient: Based on patients clinical presentation and evaluation of above clinical data, I have made determination that patient meets Inpatient criteria at this time.  Time spent in minutes : 60 minutes   Meredeth Ide M.D on 03/21/2019 at 8:09 PM

## 2019-03-21 NOTE — Consult Note (Signed)
Reason for Consult: Left hip intertrochanteric proximal femur fracture Referring Physician: [facility], [facility]  [name] is an 73 y.o. female.  HPI: The patient is a 73 year old female with a history of alcohol abuse who sustained a mechanical fall at home earlier today.  With severe hip pain and inability to ambulate on her left lower extremity [facility].  X-rays confirmed a left intertrochanteric hip fracture.  This correlates with her clinical exam as well.  Orthopedic surgery is consulted for evaluation treatment of this fracture.  The patient does report significant left hip pain.  She unfortunately is not a good historian and states that she is not sure why she is actually here in the emergency room.  She does give a significant history of extensive alcohol use daily.  Past Medical History:  Diagnosis Date  . Anxiety   . Current smoker   . Depression   . ETOH abuse   . GERD (gastroesophageal reflux disease)   . History of shingles summer 2015  . HTN (hypertension)   . Hyperlipidemia   . Hypertension   . Leg edema   . Neuropathy   . Neuropathy   . Pancreatitis     Past Surgical History:  Procedure Laterality Date  . ERCP N/A 02/01/2017   Procedure: ENDOSCOPIC RETROGRADE CHOLANGIOPANCREATOGRAPHY (ERCP);  Surgeon: Dorena CookeyJohn Hayes, MD;  [facility];  Service: Endoscopy;  Laterality: N/A;  . LAPAROSCOPIC CHOLECYSTECTOMY WITH COMMON DUCT EXPLORATION AND INTRAOP CHOLANGIOGRAM N/A 02/03/2017   Procedure: LAPAROSCOPIC CHOLECYSTECTOMY WITH POSSIBLE COMMON DUCT EXPLORATION AND INTRAOP CHOLANGIOGRAM;  Surgeon: Ovidio Kinavid Newman, MD;  Location: [facility];  Service: General;  Laterality: N/A;  . removal of uterine fibroids  2004    Family History  Problem Relation Age of Onset  . Heart disease Father   . Neuropathy Neg Hx     Social History:  reports that she has been smoking cigarettes. She has been smoking about 1.00 pack per day. She has  never used smokeless tobacco. She reports current alcohol use of about 14.0 standard drinks of alcohol per week. She reports that she does not use drugs.  Allergies:  Allergies  Allergen Reactions  . Naltrexone Shortness Of Breath  . Rocephin [Ceftriaxone Sodium In Dextrose] Anaphylaxis  . Penicillins Rash    Has patient had a PCN reaction causing immediate rash, facial/tongue/throat swelling, SOB or lightheadedness with hypotension: Yes immediate rash  Has patient had a PCN reaction causing severe rash involving mucus membranes or skin necrosis: Yes Has patient had a PCN reaction that required hospitalization": No Has patient had a PCN reaction occurring within the last 10 years: Yes If all of the above answers are "NO", then may proceed with Cephalosporin use.     Medications: I have reviewed the patient's current medications.  Results for orders placed or performed during the hospital encounter of 03/21/19 (from the past 48 hour(s))  Urinalysis, Routine w reflex microscopic     Status: Abnormal   Collection Time: 03/21/19  6:15 PM  Result Value Ref Range   Color, Urine YELLOW YELLOW   APPearance CLEAR CLEAR   Specific Gravity, Urine 1.006 1.005 - 1.030   pH 6.0 5.0 - 8.0   Glucose, UA NEGATIVE NEGATIVE mg/dL   Hgb urine dipstick SMALL (A) NEGATIVE   Bilirubin Urine NEGATIVE NEGATIVE   Ketones, ur 5 (A) NEGATIVE mg/dL   Protein, ur NEGATIVE NEGATIVE mg/dL   Nitrite NEGATIVE NEGATIVE   Leukocytes,Ua NEGATIVE  NEGATIVE   RBC / HPF 0-5 0 - 5 RBC/hpf   WBC, UA 0-5 0 - 5 WBC/hpf   Bacteria, UA NONE SEEN NONE SEEN   Squamous Epithelial / LPF 0-5 0 - 5    Comment: Performed at [facility], [address], [place], [place] [address]  CBC with Differential/Platelet     Status: Abnormal   Collection Time: 03/21/19  6:33 PM  Result Value Ref Range   WBC 8.7 4.0 - 10.5 K/uL   RBC 3.10 (L) 3.87 - 5.11 MIL/uL   Hemoglobin 11.5 (L) 12.0 - 15.0 g/dL   HCT 78.2 (L)  42.3 - 46.0 %   MCV 106.1 (H) 80.0 - 100.0 fL   MCH 37.1 (H) 26.0 - 34.0 pg   MCHC 35.0 30.0 - 36.0 g/dL   RDW 53.6 14.4 - 31.5 %   Platelets 210 150 - 400 K/uL   nRBC 0.0 0.0 - 0.2 %   Neutrophils Relative % 81 %   Neutro Abs 7.0 1.7 - 7.7 K/uL   Lymphocytes Relative 9 %   Lymphs Abs 0.8 0.7 - 4.0 K/uL   Monocytes Relative 9 %   Monocytes Absolute 0.8 0.1 - 1.0 K/uL   Eosinophils Relative 0 %   Eosinophils Absolute 0.0 0.0 - 0.5 K/uL   Basophils Relative 0 %   Basophils Absolute 0.0 0.0 - 0.1 K/uL   Immature Granulocytes 1 %   Abs Immature Granulocytes 0.04 0.00 - 0.07 K/uL    Comment: Performed at [facility], [address] [place], [place] [address]  Comprehensive metabolic panel     Status: Abnormal   Collection Time: 03/21/19  6:33 PM  Result Value Ref Range   Sodium 142 135 - 145 mmol/L   Potassium <2.0 (LL) 3.5 - 5.1 mmol/L    Comment: REPEATED TO VERIFY CRITICAL RESULT CALLED TO, READ BACK BY AND VERIFIED WITH: C.FRANKLIN AT 1923 ON 03/21/19 BY N.THOMPSON    Chloride 119 (H) 98 - 111 mmol/L   CO2 16 (L) 22 - 32 mmol/L   Glucose, Bld 96 70 - 99 mg/dL   BUN 7 (L) 8 - 23 mg/dL   Creatinine, Ser <7.61 (L) 0.44 - 1.00 mg/dL   Calcium 5.3 (LL) 8.9 - 10.3 mg/dL    Comment: CRITICAL RESULT CALLED TO, READ BACK BY AND VERIFIED WITH: C.FRANKLIN AT 1923 ON 03/21/19 BY N.THOMPSON    Total Protein 4.1 (L) 6.5 - 8.1 g/dL   Albumin 2.0 (L) 3.5 - 5.0 g/dL   AST 13 (L) 15 - 41 U/L   ALT 8 0 - 44 U/L   Alkaline Phosphatase 55 38 - 126 U/L   Total Bilirubin 0.2 (L) 0.3 - 1.2 mg/dL   GFR calc non Af Amer NOT CALCULATED >60 mL/min   GFR calc Af Amer NOT CALCULATED >60 mL/min   Anion gap 7 5 - 15    Comment: Performed at [facility], [address], [place], [place] [address]    Dg Chest 1 View  Result Date: 03/21/2019 CLINICAL DATA:  Fall EXAM: CHEST  1 VIEW COMPARISON:  07/24/2018 FINDINGS: Heart size and vascularity normal. Atherosclerotic  aortic arch. Lungs are clear without infiltrate or effusion. No pneumothorax. Apical pleural scarring bilaterally. IMPRESSION: No active disease. Electronically Signed   By: Marlan Palau M.D.   On: 03/21/2019 18:00   Dg Hip Unilat W Or Wo Pelvis 2-3 Views Left  Result Date: 03/21/2019 CLINICAL DATA:  Multiple  recent falls. EXAM: DG HIP (WITH OR WITHOUT PELVIS) 2-3V LEFT COMPARISON:  CT abdomen and pelvis 01/31/2019. Bilateral hip radiographs 04/10/2018. FINDINGS: There is an intertrochanteric fracture of the left femur with at most minimal displacement. No hip dislocation is evident. Atherosclerotic vascular calcifications are noted. Speckled calcification in the pelvis corresponds to uterine fibroid on CT. IMPRESSION: Left intertrochanteric femur fracture. Electronically Signed   By: Sebastian Ache M.D.   On: 03/21/2019 17:56    ROS Blood pressure 111/78, pulse (!) 105, temperature 98.5 F (36.9 C), temperature source Oral, resp. rate (!) 21, height  (1.676 m), weight 49.9 kg, SpO2 96 %. Physical Exam  Constitutional: She appears well-developed.  HENT:  Head: Normocephalic and atraumatic.  Neck: Normal range of motion. Neck supple.  Cardiovascular: Tachycardia present.  Respiratory: Effort normal.  GI: Soft.  Musculoskeletal:     Left hip: She exhibits decreased range of motion, decreased strength, tenderness, bony tenderness and deformity.  Neurological: She is alert.  Skin: Skin is warm and dry.   The left lower extremity is shortened and externally rotated  Assessment/Plan: Minimally displaced left hip intertrochanteric fracture  This is certainly a fracture that should be addressed surgically in terms of open reduction-internal fixation.  This can be done through small incisions with an intramedullary rod and a large hip screw.  This will then allow the patient to be able to weight-bear as tolerated and be up for mobility purposes with physical therapy.  I will contact her  daughter about this as well.  Obviously given the patient's alcohol history and abuse there is a possibility that she could develop DTs during this hospitalization.  She is quite cachectic and malnourished and her electrolytes are significantly off.  She is graciously being admitted by [facility]ad hospitalists for medical management of her hypokalemia and hypercalcemia.  I will tentatively put her on the operating room schedule for tomorrow if she is medically cleared and her electrolytes improved will allow for a safe surgery.  Obviously we can delay this another day or so if needed for medical optimization.  Kathryne Hitch 03/21/2019, 8:21 PM

## 2019-03-21 NOTE — ED Notes (Signed)
ED TO INPATIENT HANDOFF REPORT  Name/Age/Gender April [name] 73 y.o. female  Code Status Code Status History    Date Active Date Inactive Code Status Order ID Comments User Context   07/24/2018 2043 07/26/2018 1546 Full Code [id number]  Tonye Royalty, DO Inpatient   10/05/2017 0158 10/08/2017 1916 Full Code [id number]  Clydie Braun, MD ED   04/09/2017 2020 04/13/2017 1904 Full Code [id number]  Michael Litter, MD ED   02/11/2017 0851 02/12/2017 1705 Full Code [id number]  Haydee Salter, MD ED   01/30/2017 0305 02/06/2017 2122 Full Code [id number]  Danford, Earl Lites, MD Inpatient      Home/SNF/Other Home  Chief Complaint Fall  Level of Care/Admitting Diagnosis ED Disposition    ED Disposition Condition Comment   Admit  [facility] [[id number][facility]  Level of Care: Med-Surg [16]  Diagnosis: Hip fracture [facility] [638756]  Admitting Physician: Lovie Chol  Attending Physician: Meredeth Ide [[id number]]  Estimated length of stay: 3 - 4 days  Certification:: I certify this patient will need inpatient services for at least 2 midnights  PT Class (Do Not Modify): Inpatient [101]  PT Acc Code (Do Not Modify): Private [1]       Medical History Past Medical History:  Diagnosis Date  . Anxiety   . Current smoker   . Depression   . ETOH abuse   . GERD (gastroesophageal reflux disease)   . History of shingles summer 2015  . HTN (hypertension)   . Hyperlipidemia   . Hypertension   . Leg edema   . Neuropathy   . Neuropathy   . Pancreatitis     Allergies Allergies  Allergen Reactions  . Naltrexone Shortness Of Breath  . Rocephin [Ceftriaxone Sodium In Dextrose] Anaphylaxis  . Penicillins Rash    Has patient had a PCN reaction causing immediate rash, facial/tongue/throat swelling, SOB or lightheadedness with hypotension: Yes immediate rash  Has patient had a PCN reaction causing severe rash involving mucus membranes or skin necrosis: Yes Has  patient had a PCN reaction that required hospitalization": No Has patient had a PCN reaction occurring within the last 10 years: Yes If all of the above answers are "NO", then may proceed with Cephalosporin use.     IV Location/Drains/Wounds Patient Lines/Drains/Airways Status   Active Line/Drains/Airways    Name:   Placement date:   Placement time:   Site:   Days:   Peripheral IV 03/21/19 Right Hand   03/21/19    -    Hand   less than 1   Peripheral IV 03/21/19 Right Forearm   03/21/19    -    Forearm   less than 1          Labs/Imaging Results for orders placed or performed during the hospital encounter of 03/21/19 (from the past 48 hour(s))  Urinalysis, Routine w reflex microscopic     Status: Abnormal   Collection Time: 03/21/19  6:15 PM  Result Value Ref Range   Color, Urine YELLOW YELLOW   APPearance CLEAR CLEAR   Specific Gravity, Urine 1.006 1.005 - 1.030   pH 6.0 5.0 - 8.0   Glucose, UA NEGATIVE NEGATIVE mg/dL   Hgb urine dipstick SMALL (A) NEGATIVE   Bilirubin Urine NEGATIVE NEGATIVE   Ketones, ur 5 (A) NEGATIVE mg/dL   Protein, ur NEGATIVE NEGATIVE mg/dL   Nitrite NEGATIVE NEGATIVE   Leukocytes,Ua NEGATIVE NEGATIVE   RBC / HPF 0-5 0 - 5 RBC/hpf  WBC, UA 0-5 0 - 5 WBC/hpf   Bacteria, UA NONE SEEN NONE SEEN   Squamous Epithelial / LPF 0-5 0 - 5    Comment: Performed at [facility], [address], [place], [place] [address]  CBC with Differential/Platelet     Status: Abnormal   Collection Time: 03/21/19  6:33 PM  Result Value Ref Range   WBC 8.7 4.0 - 10.5 K/uL   RBC 3.10 (L) 3.87 - 5.11 MIL/uL   Hemoglobin 11.5 (L) 12.0 - 15.0 g/dL   HCT 38.1 (L) 01.7 - 51.0 %   MCV 106.1 (H) 80.0 - 100.0 fL   MCH 37.1 (H) 26.0 - 34.0 pg   MCHC 35.0 30.0 - 36.0 g/dL   RDW 25.8 52.7 - 78.2 %   Platelets 210 150 - 400 K/uL   nRBC 0.0 0.0 - 0.2 %   Neutrophils Relative % 81 %   Neutro Abs 7.0 1.7 - 7.7 K/uL   Lymphocytes Relative 9 %   Lymphs Abs 0.8  0.7 - 4.0 K/uL   Monocytes Relative 9 %   Monocytes Absolute 0.8 0.1 - 1.0 K/uL   Eosinophils Relative 0 %   Eosinophils Absolute 0.0 0.0 - 0.5 K/uL   Basophils Relative 0 %   Basophils Absolute 0.0 0.0 - 0.1 K/uL   Immature Granulocytes 1 %   Abs Immature Granulocytes 0.04 0.00 - 0.07 K/uL    Comment: Performed at [facility], [address] [place], [place] [address]  Comprehensive metabolic panel     Status: Abnormal   Collection Time: 03/21/19  6:33 PM  Result Value Ref Range   Sodium 142 135 - 145 mmol/L   Potassium <2.0 (LL) 3.5 - 5.1 mmol/L    Comment: REPEATED TO VERIFY CRITICAL RESULT CALLED TO, READ BACK BY AND VERIFIED WITH: C.FRANKLIN AT 1923 ON 03/21/19 BY N.THOMPSON    Chloride 119 (H) 98 - 111 mmol/L   CO2 16 (L) 22 - 32 mmol/L   Glucose, Bld 96 70 - 99 mg/dL   BUN 7 (L) 8 - 23 mg/dL   Creatinine, Ser <6.14 (L) 0.44 - 1.00 mg/dL   Calcium 5.3 (LL) 8.9 - 10.3 mg/dL    Comment: CRITICAL RESULT CALLED TO, READ BACK BY AND VERIFIED WITH: C.FRANKLIN AT 1923 ON 03/21/19 BY N.THOMPSON    Total Protein 4.1 (L) 6.5 - 8.1 g/dL   Albumin 2.0 (L) 3.5 - 5.0 g/dL   AST 13 (L) 15 - 41 U/L   ALT 8 0 - 44 U/L   Alkaline Phosphatase 55 38 - 126 U/L   Total Bilirubin 0.2 (L) 0.3 - 1.2 mg/dL   GFR calc non Af Amer NOT CALCULATED >60 mL/min   GFR calc Af Amer NOT CALCULATED >60 mL/min   Anion gap 7 5 - 15    Comment: Performed at [facility], [address] [place], [place] [address]   Dg Chest 1 View  Result Date: 03/21/2019 CLINICAL DATA:  Fall EXAM: CHEST  1 VIEW COMPARISON:  07/24/2018 FINDINGS: Heart size and vascularity normal. Atherosclerotic aortic arch. Lungs are clear without infiltrate or effusion. No pneumothorax. Apical pleural scarring bilaterally. IMPRESSION: No active disease. Electronically Signed   By: Marlan Palau M.D.   On: 03/21/2019 18:00   Dg Hip Unilat W Or Wo Pelvis 2-3 Views Left  Result Date: 03/21/2019 CLINICAL  DATA:  Multiple recent falls. EXAM: DG HIP (WITH OR WITHOUT PELVIS) 2-3V LEFT COMPARISON:  CT  abdomen and pelvis 01/31/2019. Bilateral hip radiographs 04/10/2018. FINDINGS: There is an intertrochanteric fracture of the left femur with at most minimal displacement. No hip dislocation is evident. Atherosclerotic vascular calcifications are noted. Speckled calcification in the pelvis corresponds to uterine fibroid on CT. IMPRESSION: Left intertrochanteric femur fracture. Electronically Signed   By: Sebastian AcheAllen  Grady M.D.   On: 03/21/2019 17:56    Pending Labs Unresulted Labs (From admission, onward)    Start     Ordered   03/21/19 1925  Magnesium  Once,   R     03/21/19 1925   03/21/19 1658  Urine Culture  ONCE - STAT,   STAT     03/21/19 1657   Signed and Held  CBC  (enoxaparin (LOVENOX)    CrCl >/= 30 ml/min)  Once,   R    Comments:  Baseline for enoxaparin therapy IF NOT ALREADY DRAWN.  Notify MD if PLT < 100 K.    Signed and Held   Signed and Held  Creatinine, serum  (enoxaparin (LOVENOX)    CrCl >/= 30 ml/min)  Once,   R    Comments:  Baseline for enoxaparin therapy IF NOT ALREADY DRAWN.    Signed and Held   Signed and Held  Creatinine, serum  (enoxaparin (LOVENOX)    CrCl >/= 30 ml/min)  Weekly,   R    Comments:  while on enoxaparin therapy    Signed and Held   Signed and Held  Basic metabolic panel  Tomorrow morning,   R     Signed and Held   Signed and Held  CBC  Tomorrow morning,   R     Signed and Held   Signed and Held  Basic metabolic panel  Once,   R     Signed and Held          Vitals/Pain Today's Vitals   03/21/19 1825 03/21/19 1830 03/21/19 2000 03/21/19 2011  BP:  119/82 111/78   Pulse:  94 (!) 105   Resp:  (!) 23 (!) 21   Temp:   98.5 F (36.9 C)   TempSrc:   Oral   SpO2:  95% 96%   Weight:      Height:      PainSc: 5    4     Isolation Precautions No active isolations  Medications Medications  0.9 %  sodium chloride infusion ( Intravenous New Bag/Given  03/21/19 1733)  potassium chloride 10 mEq in 100 mL IVPB (10 mEq Intravenous New Bag/Given 03/21/19 1941)  calcium gluconate 1 g/ 50 mL sodium chloride IVPB (1,000 mg Intravenous New Bag/Given 03/21/19 2009)  morphine 4 MG/ML injection 4 mg (4 mg Intravenous Given 03/21/19 1730)  potassium chloride SA (K-DUR,KLOR-CON) CR tablet 60 mEq (60 mEq Oral Given 03/21/19 1942)    Mobility walks with device

## 2019-03-21 NOTE — ED Provider Notes (Signed)
[facility]-EMERGENCY DEPT Provider Note   CSN: [id number] Arrival date & time: 03/21/19  1602    History   Chief Complaint Chief Complaint  Patient presents with  . Fall  . Weakness  . Failure To Thrive    HPI April [name] is a 73 y.o. female.     73 year old female presents with pain to her left hip after a fall.  States that she has had 3 falls in the last 3 days.  Becomes dizzy and lightheaded and then falls into her left side.  Denies striking her head or having loss of consciousness.  No chest pain or shortness of breath prior to the event.  No recent fever or chills.  No vomiting or diarrhea.  No recent changes to her medications.  Today after the fall she was unable to ambulate.  Pain is characterizes sharp and worsening movement of her hip.  Called EMS and was transported here.     Past Medical History:  Diagnosis Date  . Anxiety   . Current smoker   . Depression   . ETOH abuse   . GERD (gastroesophageal reflux disease)   . History of shingles summer 2015  . HTN (hypertension)   . Hyperlipidemia   . Hypertension   . Leg edema   . Neuropathy   . Neuropathy   . Pancreatitis     Patient Active Problem List   Diagnosis Date Noted  . Anaphylactic reaction 07/24/2018  . FTT (failure to thrive) in adult 05/18/2018  . Falls 05/18/2018  . Pancreatitis 10/05/2017  . Frequent falls 02/11/2017  . Protein-calorie malnutrition, severe 02/05/2017  . Gallstone pancreatitis 01/30/2017  . Hypokalemia 01/30/2017  . Macrocytic anemia 01/30/2017  . Metabolic acidosis 01/30/2017  . Neuropathy, alcoholic (HCC) 09/08/2016  . Neuropathy 12/11/2015  . Alcohol abuse 12/11/2015  . Tobacco abuse 12/11/2015  . Chronic venous insufficiency 09/13/2015  . Extremity numbness 09/13/2015    Past Surgical History:  Procedure Laterality Date  . ERCP N/A 02/01/2017   Procedure: ENDOSCOPIC RETROGRADE CHOLANGIOPANCREATOGRAPHY (ERCP);  Surgeon: Dorena Cookey, MD;   Location: [facility];  Service: Endoscopy;  Laterality: N/A;  . LAPAROSCOPIC CHOLECYSTECTOMY WITH COMMON DUCT EXPLORATION AND INTRAOP CHOLANGIOGRAM N/A 02/03/2017   Procedure: LAPAROSCOPIC CHOLECYSTECTOMY WITH POSSIBLE COMMON DUCT EXPLORATION AND INTRAOP CHOLANGIOGRAM;  Surgeon: Ovidio Kin, MD;  Location: [facility];  Service: General;  Laterality: N/A;  . removal of uterine fibroids  2004     OB History   No obstetric history on file.      Home Medications    Prior to Admission medications   Medication Sig Start Date End Date Taking? Authorizing Provider  amitriptyline (ELAVIL) 25 MG tablet Take 25 mg by mouth at bedtime as needed for sleep.  08/27/16   [provider]  clonazePAM (KLONOPIN) 0.5 MG tablet Take 0.5 mg by mouth 2 (two) times daily as needed for anxiety. as directed 08/05/17   [provider]  feeding supplement, ENSURE ENLIVE, (ENSURE ENLIVE) LIQD Take 237 mLs by mouth 2 (two) times daily between meals. 07/26/18   Narda Bonds, MD  Maltodextrin-Xanthan Gum (RESOURCE THICKENUP CLEAR) POWD Nectar thick consistency for fluid intake 07/26/18   Narda Bonds, MD  mirtazapine (REMERON) 7.5 MG tablet Take 7.5 mg by mouth at bedtime. 04/14/18   [provider]  Multiple Vitamin (MULTIVITAMIN WITH MINERALS) TABS tablet Take 1 tablet by mouth daily. 04/13/17   Penny Pia, MD  polyethylene glycol [facility] / Ethelene Hal) packet Take  17 g by mouth daily. 07/27/18   Narda BondsNettey, Ralph A, MD  potassium chloride (K-DUR,KLOR-CON) 10 MEQ tablet Take 10 mEq by mouth 2 (two) times daily.    [provider]    Family History Family History  Problem Relation Age of Onset  . Heart disease Father   . Neuropathy Neg Hx     Social History Social History   Tobacco Use  . Smoking status: Current Every Day Smoker    Packs/day: 1.00    Types: Cigarettes  . Smokeless tobacco: Never Used  Substance Use Topics  . Alcohol use: Yes    Alcohol/week: 14.0 standard  drinks    Types: 14 Shots of liquor per week    Comment: states., "I keep a glass with me", reports drinking today (04/09/17)  . Drug use: No     Allergies   Naltrexone; Rocephin [ceftriaxone sodium in dextrose]; and Penicillins   Review of Systems Review of Systems  All other systems reviewed and are negative.    Physical Exam Updated Vital Signs BP (!) 145/74   Pulse 92   Temp 98.2 F (36.8 C)   Resp (!) 24   Ht 1.676 m (5\' 6" )   Wt 49.9 kg   SpO2 98%   BMI 17.75 kg/m   Physical Exam Vitals signs and nursing note reviewed.  Constitutional:      General: She is not in acute distress.    Appearance: Normal appearance. She is well-developed. She is not toxic-appearing.  HENT:     Head: Normocephalic and atraumatic.  Eyes:     General: Lids are normal.     Conjunctiva/sclera: Conjunctivae normal.     Pupils: Pupils are equal, round, and reactive to light.  Neck:     Musculoskeletal: Normal range of motion and neck supple.     Thyroid: No thyroid mass.     Trachea: No tracheal deviation.  Cardiovascular:     Rate and Rhythm: Normal rate and regular rhythm.     Heart sounds: Normal heart sounds. No murmur. No gallop.   Pulmonary:     Effort: Pulmonary effort is normal. No respiratory distress.     Breath sounds: Normal breath sounds. No stridor. No decreased breath sounds, wheezing, rhonchi or rales.  Abdominal:     General: Bowel sounds are normal. There is no distension.     Palpations: Abdomen is soft.     Tenderness: There is no abdominal tenderness. There is no rebound.  Musculoskeletal: Normal range of motion.        General: No tenderness.     Comments: Left lower extremity shortened and rotated neurovascular intact at left foot.  Skin:    General: Skin is warm and dry.     Findings: No abrasion or rash.  Neurological:     Mental Status: She is alert and oriented to person, place, and time.     GCS: GCS eye subscore is 4. GCS verbal subscore is 5. GCS  motor subscore is 6.     Cranial Nerves: No cranial nerve deficit.     Sensory: No sensory deficit.  Psychiatric:        Speech: Speech normal.        Behavior: Behavior normal.      ED Treatments / Results  Labs (all labs ordered are listed, but only abnormal results are displayed) Labs Reviewed  URINE CULTURE  CBC WITH DIFFERENTIAL/PLATELET  COMPREHENSIVE METABOLIC PANEL  URINALYSIS, ROUTINE W REFLEX MICROSCOPIC  EKG None  Radiology No results found.  Procedures Procedures (including critical care time)  Medications Ordered in ED Medications  0.9 %  sodium chloride infusion (has no administration in time range)     Initial Impression / Assessment and Plan / ED Course  I have reviewed the triage vital signs and the nursing notes.  Pertinent labs & imaging results that were available during my care of the patient were reviewed by me and considered in my medical decision making (see chart for details).        Patient medicated with morphine for pain and she feels better.  X-ray results noted and consistent with hip fracture.  Will consult orthopedics and admit to the hospitalist service  Final Clinical Impressions(s) / ED Diagnoses   Final diagnoses:  None    ED Discharge Orders    None       Lorre Nick, MD 03/21/19 1900

## 2019-03-22 ENCOUNTER — Inpatient Hospital Stay (HOSPITAL_COMMUNITY): Payer: PPO

## 2019-03-22 ENCOUNTER — Encounter (HOSPITAL_COMMUNITY)
Admission: EM | Disposition: A | Discharge status: Home Health | Payer: Self-pay | Source: Home / Self Care | Attending: Internal Medicine

## 2019-03-22 ENCOUNTER — Inpatient Hospital Stay (HOSPITAL_COMMUNITY): Payer: PPO | Admitting: Certified Registered"

## 2019-03-22 ENCOUNTER — Encounter (HOSPITAL_COMMUNITY): Payer: Self-pay | Admitting: Emergency Medicine

## 2019-03-22 HISTORY — PX: INTRAMEDULLARY (IM) NAIL INTERTROCHANTERIC: SHX5875

## 2019-03-22 LAB — COMPREHENSIVE METABOLIC PANEL
ALT: 12 U/L (ref 0–44)
AST: 17 U/L (ref 15–41)
Albumin: 3.1 g/dL — ABNORMAL LOW (ref 3.5–5.0)
Alkaline Phosphatase: 80 U/L (ref 38–126)
Anion gap: 10 (ref 5–15)
BUN: 7 mg/dL — ABNORMAL LOW (ref 8–23)
CO2: 20 mmol/L — ABNORMAL LOW (ref 22–32)
Calcium: 8.3 mg/dL — ABNORMAL LOW (ref 8.9–10.3)
Chloride: 108 mmol/L (ref 98–111)
Creatinine, Ser: 0.3 mg/dL — ABNORMAL LOW (ref 0.44–1.00)
GFR calc Af Amer: >60 mL/min (ref >60)
GFR calc non Af Amer: >60 mL/min (ref >60)
Glucose, Bld: 115 mg/dL — ABNORMAL HIGH (ref 70–99)
Potassium: 3.5 mmol/L (ref 3.5–5.1)
Sodium: 138 mmol/L (ref 135–145)
Total Bilirubin: 0.7 mg/dL (ref 0.3–1.2)
Total Protein: 5.9 g/dL — ABNORMAL LOW (ref 6.5–8.1)

## 2019-03-22 LAB — CBC
HCT: 39.3 % (ref 36.0–46.0)
Hemoglobin: 13 g/dL (ref 12.0–15.0)
MCH: 35.5 pg — ABNORMAL HIGH (ref 26.0–34.0)
MCHC: 33.1 g/dL (ref 30.0–36.0)
MCV: 107.4 fL — ABNORMAL HIGH (ref 80.0–100.0)
Platelets: 261 10*3/uL (ref 150–400)
RBC: 3.66 MIL/uL — ABNORMAL LOW (ref 3.87–5.11)
RDW: 14.2 % (ref 11.5–15.5)
WBC: 11.6 10*3/uL — ABNORMAL HIGH (ref 4.0–10.5)
nRBC: 0 % (ref 0.0–0.2)

## 2019-03-22 LAB — MAGNESIUM: Magnesium: 1.6 mg/dL — ABNORMAL LOW (ref 1.7–2.4)

## 2019-03-22 LAB — URINE CULTURE

## 2019-03-22 LAB — SURGICAL PCR SCREEN
MRSA, PCR: NEGATIVE
Staphylococcus aureus: NEGATIVE

## 2019-03-22 SURGERY — FIXATION, FRACTURE, INTERTROCHANTERIC, WITH INTRAMEDULLARY ROD
Anesthesia: General | Site: Hip | Laterality: Left

## 2019-03-22 MED ORDER — OXYCODONE HCL 5 MG/5ML PO SOLN: 5.0000 mg | Freq: Once | ORAL | Status: DC | PRN

## 2019-03-22 MED ORDER — 0.9 % SODIUM CHLORIDE (POUR BTL) OPTIME
TOPICAL | Status: DC | PRN
  Administered 2019-03-22: 1000 mL

## 2019-03-22 MED ORDER — ONDANSETRON HCL 4 MG/2ML IJ SOLN
4.0000 mg | Freq: Four times a day (QID) | INTRAMUSCULAR | Status: DC | PRN
  Administered 2019-03-23: 4 mg via INTRAVENOUS
  Filled 2019-03-22: qty 2

## 2019-03-22 MED ORDER — PHENOL 1.4 % MT LIQD: 1.0000 | OROMUCOSAL | Status: DC | PRN

## 2019-03-22 MED ORDER — OXYCODONE HCL 5 MG PO TABS
5.0000 mg | ORAL_TABLET | ORAL | Status: DC | PRN
  Administered 2019-03-23: 15:00:00 5 mg via ORAL
  Filled 2019-03-22: qty 1

## 2019-03-22 MED ORDER — ONDANSETRON HCL 4 MG/2ML IJ SOLN
INTRAMUSCULAR | Status: DC | PRN
  Administered 2019-03-22: 4 mg via INTRAVENOUS

## 2019-03-22 MED ORDER — FENTANYL CITRATE (PF) 100 MCG/2ML IJ SOLN
INTRAMUSCULAR | Status: DC | PRN
  Administered 2019-03-22: 25 ug via INTRAVENOUS
  Administered 2019-03-22: 50 ug via INTRAVENOUS
  Administered 2019-03-22 (×2): 25 ug via INTRAVENOUS

## 2019-03-22 MED ORDER — METHOCARBAMOL 500 MG IVPB - SIMPLE MED
500.0000 mg | Freq: Four times a day (QID) | INTRAVENOUS | Status: DC | PRN
  Filled 2019-03-22: qty 50

## 2019-03-22 MED ORDER — ACETAMINOPHEN 325 MG PO TABS
325.0000 mg | ORAL_TABLET | Freq: Four times a day (QID) | ORAL | Status: DC | PRN
  Administered 2019-03-23: 650 mg via ORAL
  Administered 2019-03-24: 325 mg via ORAL
  Administered 2019-03-24: 02:00:00 650 mg via ORAL
  Filled 2019-03-22 (×2): qty 2
  Filled 2019-03-22: qty 1

## 2019-03-22 MED ORDER — CEFAZOLIN SODIUM-DEXTROSE 2-4 GM/100ML-% IV SOLN
INTRAVENOUS | Status: AC
  Filled 2019-03-22: qty 100

## 2019-03-22 MED ORDER — HYDROMORPHONE HCL 1 MG/ML IJ SOLN: 0.5000 mg | INTRAMUSCULAR | Status: DC | PRN

## 2019-03-22 MED ORDER — FENTANYL CITRATE (PF) 100 MCG/2ML IJ SOLN
INTRAMUSCULAR | Status: AC
  Filled 2019-03-22: qty 2

## 2019-03-22 MED ORDER — TRANEXAMIC ACID-NACL 1000-0.7 MG/100ML-% IV SOLN
INTRAVENOUS | Status: AC
  Filled 2019-03-22: qty 100

## 2019-03-22 MED ORDER — OXYCODONE HCL 5 MG PO TABS: 5.0000 mg | ORAL_TABLET | Freq: Once | ORAL | Status: DC | PRN

## 2019-03-22 MED ORDER — OXYCODONE HCL 5 MG PO TABS
10.0000 mg | ORAL_TABLET | ORAL | Status: DC | PRN
  Administered 2019-03-23: 01:00:00 10 mg via ORAL
  Filled 2019-03-22: qty 2

## 2019-03-22 MED ORDER — ASPIRIN EC 325 MG PO TBEC
325.0000 mg | DELAYED_RELEASE_TABLET | Freq: Every day | ORAL | Status: DC
  Administered 2019-03-23 – 2019-03-24 (×2): 325 mg via ORAL
  Filled 2019-03-22 (×2): qty 1

## 2019-03-22 MED ORDER — CEFAZOLIN SODIUM-DEXTROSE 2-3 GM-%(50ML) IV SOLR
INTRAVENOUS | Status: DC | PRN
  Administered 2019-03-22: 2 g via INTRAVENOUS

## 2019-03-22 MED ORDER — ONDANSETRON HCL 4 MG PO TABS: 4.0000 mg | ORAL_TABLET | Freq: Four times a day (QID) | ORAL | Status: DC | PRN

## 2019-03-22 MED ORDER — PHENYLEPHRINE 40 MCG/ML (10ML) SYRINGE FOR IV PUSH (FOR BLOOD PRESSURE SUPPORT)
PREFILLED_SYRINGE | INTRAVENOUS | Status: DC | PRN
  Administered 2019-03-22 (×2): 120 ug via INTRAVENOUS
  Administered 2019-03-22: 80 ug via INTRAVENOUS

## 2019-03-22 MED ORDER — METHOCARBAMOL 500 MG PO TABS
500.0000 mg | ORAL_TABLET | Freq: Four times a day (QID) | ORAL | Status: DC | PRN
  Administered 2019-03-23: 09:00:00 500 mg via ORAL

## 2019-03-22 MED ORDER — MENTHOL 3 MG MT LOZG: 1.0000 | LOZENGE | OROMUCOSAL | Status: DC | PRN

## 2019-03-22 MED ORDER — FENTANYL CITRATE (PF) 100 MCG/2ML IJ SOLN: 25.0000 ug | INTRAMUSCULAR | Status: DC | PRN

## 2019-03-22 MED ORDER — METOCLOPRAMIDE HCL 5 MG PO TABS: 5.0000 mg | ORAL_TABLET | Freq: Three times a day (TID) | ORAL | Status: DC | PRN

## 2019-03-22 MED ORDER — DEXAMETHASONE SODIUM PHOSPHATE 10 MG/ML IJ SOLN
INTRAMUSCULAR | Status: DC | PRN
  Administered 2019-03-22: 8 mg via INTRAVENOUS

## 2019-03-22 MED ORDER — LACTATED RINGERS IV SOLN
INTRAVENOUS | Status: DC
  Administered 2019-03-22: 15:00:00 via INTRAVENOUS

## 2019-03-22 MED ORDER — PROPOFOL 10 MG/ML IV BOLUS
INTRAVENOUS | Status: DC | PRN
  Administered 2019-03-22: 80 mg via INTRAVENOUS

## 2019-03-22 MED ORDER — METOCLOPRAMIDE HCL 5 MG/ML IJ SOLN: 5.0000 mg | Freq: Three times a day (TID) | INTRAMUSCULAR | Status: DC | PRN

## 2019-03-22 MED ORDER — PROPOFOL 10 MG/ML IV BOLUS
INTRAVENOUS | Status: AC
  Filled 2019-03-22: qty 20

## 2019-03-22 MED ORDER — SUCCINYLCHOLINE CHLORIDE 200 MG/10ML IV SOSY
PREFILLED_SYRINGE | INTRAVENOUS | Status: DC | PRN
  Administered 2019-03-22: 80 mg via INTRAVENOUS

## 2019-03-22 MED ORDER — PANTOPRAZOLE SODIUM 40 MG PO TBEC
40.0000 mg | DELAYED_RELEASE_TABLET | Freq: Every day | ORAL | Status: DC
  Administered 2019-03-23 – 2019-03-24 (×2): 40 mg via ORAL
  Filled 2019-03-22 (×2): qty 1

## 2019-03-22 MED ORDER — LIDOCAINE 2% (20 MG/ML) 5 ML SYRINGE
INTRAMUSCULAR | Status: DC | PRN
  Administered 2019-03-22: 50 mg via INTRAVENOUS

## 2019-03-22 MED ORDER — POTASSIUM CHLORIDE 10 MEQ/100ML IV SOLN
10.0000 meq | INTRAVENOUS | Status: AC
  Administered 2019-03-22 (×2): 10 meq via INTRAVENOUS
  Filled 2019-03-22: qty 100

## 2019-03-22 MED ORDER — SODIUM CHLORIDE 0.9 % IV SOLN
INTRAVENOUS | Status: DC | PRN
  Administered 2019-03-22: 80 ug/min via INTRAVENOUS

## 2019-03-22 MED ORDER — MAGNESIUM SULFATE 2 GM/50ML IV SOLN
2.0000 g | Freq: Once | INTRAVENOUS | Status: AC
  Administered 2019-03-22: 2 g via INTRAVENOUS
  Filled 2019-03-22: qty 50

## 2019-03-22 MED ORDER — ONDANSETRON HCL 4 MG/2ML IJ SOLN: 4.0000 mg | Freq: Once | INTRAMUSCULAR | Status: DC | PRN

## 2019-03-22 MED ORDER — POTASSIUM CHLORIDE 10 MEQ/100ML IV SOLN
INTRAVENOUS | Status: AC
  Administered 2019-03-22: 18:00:00 10 meq
  Filled 2019-03-22: qty 100

## 2019-03-22 MED ORDER — DOCUSATE SODIUM 100 MG PO CAPS: 100.0000 mg | ORAL_CAPSULE | Freq: Two times a day (BID) | ORAL | Status: DC

## 2019-03-22 SURGICAL SUPPLY — 36 items
APL PRP STRL LF DISP 70% ISPRP (MISCELLANEOUS) ×1
BNDG GAUZE ELAST 4 BULKY (GAUZE/BANDAGES/DRESSINGS) ×3 IMPLANT
CHLORAPREP W/TINT 26 (MISCELLANEOUS) ×3 IMPLANT
COVER PERINEAL POST (MISCELLANEOUS) ×3 IMPLANT
COVER SURGICAL LIGHT HANDLE (MISCELLANEOUS) ×3 IMPLANT
COVER WAND RF STERILE (DRAPES) IMPLANT
DRAPE STERI IOBAN 125X83 (DRAPES) ×3 IMPLANT
DRESSING AQUACEL AG SP 3.5X10 (GAUZE/BANDAGES/DRESSINGS) IMPLANT
DRSG AQUACEL AG SP 3.5X10 (GAUZE/BANDAGES/DRESSINGS) ×3
DRSG MEPILEX BORDER 4X4 (GAUZE/BANDAGES/DRESSINGS) ×6 IMPLANT
DRSG MEPILEX BORDER 4X8 (GAUZE/BANDAGES/DRESSINGS) IMPLANT
ELECT REM PT RETURN 15FT ADLT (MISCELLANEOUS) ×3 IMPLANT
FACESHIELD WRAPAROUND (MASK) ×3 IMPLANT
FACESHIELD WRAPAROUND OR TEAM (MASK) ×1 IMPLANT
GAUZE XEROFORM 1X8 LF (GAUZE/BANDAGES/DRESSINGS) ×2 IMPLANT
GAUZE XEROFORM 5X9 LF (GAUZE/BANDAGES/DRESSINGS) ×3 IMPLANT
GLOVE BIO SURGEON STRL SZ7.5 (GLOVE) ×3 IMPLANT
GLOVE BIOGEL PI IND STRL 8 (GLOVE) ×1 IMPLANT
GLOVE BIOGEL PI INDICATOR 8 (GLOVE) ×2
GLOVE ECLIPSE 8.0 STRL XLNG CF (GLOVE) IMPLANT
GOWN STRL REUS W/TWL XL LVL3 (GOWN DISPOSABLE) ×3 IMPLANT
GUIDEPIN 3.2X17.5 THRD DISP (PIN) ×2 IMPLANT
HFN LAG SCREW 10.5MM X 85MM (Screw) ×2 IMPLANT
HFN LH 130 DEG 9MM X 360MM (Nail) ×2 IMPLANT
KIT BASIN OR (CUSTOM PROCEDURE TRAY) ×3 IMPLANT
KIT TURNOVER KIT A (KITS) IMPLANT
MANIFOLD NEPTUNE II (INSTRUMENTS) ×3 IMPLANT
NS IRRIG 1000ML POUR BTL (IV SOLUTION) ×3 IMPLANT
PACK GENERAL/GYN (CUSTOM PROCEDURE TRAY) ×3 IMPLANT
PROTECTOR NERVE ULNAR (MISCELLANEOUS) ×3 IMPLANT
STAPLER VISISTAT 35W (STAPLE) ×3 IMPLANT
SUT VIC AB 0 CT1 36 (SUTURE) ×3 IMPLANT
SUT VIC AB 1 CT1 36 (SUTURE) ×3 IMPLANT
SUT VIC AB 2-0 CT1 27 (SUTURE) ×3
SUT VIC AB 2-0 CT1 TAPERPNT 27 (SUTURE) ×1 IMPLANT
TOWEL OR 17X26 10 PK STRL BLUE (TOWEL DISPOSABLE) ×3 IMPLANT

## 2019-03-22 NOTE — Anesthesia Preprocedure Evaluation (Addendum)
Anesthesia Evaluation  Patient identified by MRN, date of birth, ID band Patient awake    Reviewed: Allergy & Precautions, NPO status , Patient's Chart, lab work & pertinent test results  History of Anesthesia Complications Negative for: history of anesthetic complications  Airway Mallampati: III  TM Distance: >3 FB Neck ROM: Full    Dental  (+) Dental Advisory Given   Pulmonary Current Smoker,    breath sounds clear to auscultation       Cardiovascular hypertension,  Rhythm:Regular Rate:Normal     Neuro/Psych PSYCHIATRIC DISORDERS Anxiety Depression  Neuromuscular disease (neuropathy)    GI/Hepatic GERD  Controlled,(+)     substance abuse  alcohol use,   Endo/Other  negative endocrine ROS  Renal/GU negative Renal ROS     Musculoskeletal negative musculoskeletal ROS (+)   Abdominal   Peds  Hematology negative hematology ROS (+)   Anesthesia Other Findings Anaphylaxis to ceftriaxone   Reproductive/Obstetrics                            Anesthesia Physical Anesthesia Plan  ASA: III  Anesthesia Plan: General   Post-op Pain Management:    Induction: Intravenous  PONV Risk Score and Plan: 4 or greater and Treatment may vary due to age or medical condition, Ondansetron, Dexamethasone and Midazolam  Airway Management Planned: Oral ETT  Additional Equipment: None  Intra-op Plan:   Post-operative Plan: Extubation in OR  Informed Consent: I have reviewed the patients History and Physical, chart, labs and discussed the procedure including the risks, benefits and alternatives for the proposed anesthesia with the patient or authorized representative who has indicated his/her understanding and acceptance.     Dental advisory given  Plan Discussed with: CRNA and Anesthesiologist  Anesthesia Plan Comments:        Anesthesia Quick Evaluation

## 2019-03-22 NOTE — Brief Op Note (Signed)
03/21/2019 - 03/22/2019  4:24 PM  PATIENT:  April [name]  73 y.o. female  PRE-OPERATIVE DIAGNOSIS:  left hip intertrochanteric femur fracture  POST-OPERATIVE DIAGNOSIS:  left hip intertrochanteric femur fracture  PROCEDURE:  Procedure(s): INTRAMEDULLARY (IM) NAIL INTERTROCHANTRIC (Left)  SURGEON:  Surgeon(s) and Role:    Kathryne Hitch, MD - Primary  PHYSICIAN ASSISTANT:  Rexene Edison, PA-C  ANESTHESIA:   general  EBL:  50 mL   COUNTS:  YES  DICTATION: .Other Dictation: Dictation Number [id number]  PLAN OF CARE: Admit to inpatient   PATIENT DISPOSITION:  PACU - hemodynamically stable.   Delay start of Pharmacological VTE agent (>24hrs) due to surgical blood loss or risk of bleeding: no

## 2019-03-22 NOTE — Transfer of Care (Signed)
   Immediate Anesthesia Transfer of Care Note  Patient: April [name]  Procedure(s) Performed: Procedure(s): INTRAMEDULLARY (IM) NAIL INTERTROCHANTRIC (Left)  Patient Location: PACU  Anesthesia Type:General  Level of Consciousness:  sedated, patient cooperative and responds to stimulation  Airway & Oxygen Therapy:Patient Spontanous Breathing and Patient connected to face mask oxgen  Post-op Assessment:  Report given to PACU RN and Post -op Vital signs reviewed and stable  Post vital signs:  Reviewed and stable  Last Vitals:  Vitals:   03/22/19 0647 03/22/19 1320  BP: (!) 149/93 128/70  Pulse: 96 92  Resp: 18 14  Temp: 37.4 C 36.5 C  SpO2: 97% 100%    Complications: No apparent anesthesia complications

## 2019-03-22 NOTE — Progress Notes (Addendum)
PROGRESS NOTE  CHASITTY [name] SNK:[id number] DOB: January 14, 1946 DOA: 03/21/2019 PCP: April [name]., PA-C   LOS: 1 day   Brief narrative:  April [name]  is a 73 y.o. female, with history of hypertension, hyperlipidemia, anxiety who presented to hospital after patient fell at home.  Patient states that she had 3 falls in last 3 days.  She became dizzy and lightheaded and then fell onto her left side.  She denies hitting her head.  In the ED, x-ray of the hip showed left intertrochanteric femur fracture.  Patient was then admitted to the hospital after consultation with orthopedics for surgical intervention.  Subjective: Patient complains of right hip pain.  He denies any shortness of breath, cough, fever or chills.  Denies any chest pain.    Assessment/Plan:  Active Problems:   Hip fracture (HCC)   Displaced intertrochanteric fracture of left femur, initial encounter for closed fracture (HCC)  Left intertrochanteric femur fracture- on hip fracture protocol.  Keep her n.p.o.  Orthopedic surgery has been consulted.  Pain control with morphine/Vicodin.  Patient has been seen by orthopedics this morning and is planning for surgery today  Hypokalemia-patient presented with severe hypokalemia with potassium less than 2.0.  Improved potassium levels after aggressive replacement..  Last potassium of 3.5.  We will continue supplements as necessary.  Hypocalcemia- Corrected calcium for low albumin on presentation was 6.9.  Patient given calcium gluconate in the ED. calcium levels of 8.3 uncorrected today.  Closely monitor calcium levels.  Will likely benefit from calcium supplement on discharge.  Prolonged QTC on presentation.  Likely from severe metabolic abnormalities including hypokalemia, hypocalcemia.   Improved hypokalemia and hypocalcemia.  Will check EKG again today.  History of anxiety.  Continue on Klonopin.  Patient was advised to reduce the doses to reduce the chances of falls   Addendum:  03/22/2019 1:06 PM   ECG reviewed with improved QTc interval.  We will give 1 dose of magnesium IV as well.  Spoke with orthopedic surgery Dr. Magnus [name] about the patient. Patient is optimized for for surgery at this time.  Will give additional 20 mEq of IV potassium today since the patient is n.p.o.  VTE Prophylaxis: Lovenox  Code Status: Full code  Family Communication: No one is available at bedside  Disposition Plan: Likely to rehab after hip surgery   Consultants:  Orthopedics  Procedures:  None  Antibiotics: Anti-infectives (From admission, onward)   None      Objective: Vitals:   03/21/19 2036 03/22/19 0647  BP: (!) 131/96 (!) 149/93  Pulse: (!) 101 96  Resp: 18 18  Temp: 98 F (36.7 C) 99.3 F (37.4 C)  SpO2: 97% 97%    Intake/Output Summary (Last 24 hours) at 03/22/2019 0949 Last data filed at 03/22/2019 0600 Gross per 24 hour  Intake 2478.76 ml  Output 1350 ml  Net 1128.76 ml   Filed Weights   03/21/19 1622  Weight: 49.9 kg   Body mass index is 17.75 kg/m.   Physical Exam: GENERAL: Patient is alert awake and oriented. HENT: No scleral pallor or icterus. Pupils equally reactive to light. Oral mucosa is moist NECK: is supple, no palpable thyroid enlargement. CHEST: Clear to auscultation. No crackles or wheezes. Non tender on palpation. Diminished breath sounds bilaterally. CVS: S1 and S2 heard, no murmur. Regular rate and rhythm. No pericardial rub. ABDOMEN: Soft, non-tender, bowel sounds are present. No palpable hepato-splenomegaly. EXTREMITIES: No edema.  Left lower extremity externally rotated with tenderness on the  hip. CNS: Cranial nerves are intact. No focal motor or sensory deficits. SKIN: warm and dry without rashes.  Data Review: I have personally reviewed the following laboratory data and studies,  CBC: Recent Labs  Lab 03/21/19 1833 03/22/19 0612  WBC 8.7 11.6*  NEUTROABS 7.0  --   HGB 11.5* 13.0  HCT 32.9* 39.3   MCV 106.1* 107.4*  PLT 210 261   Basic Metabolic Panel: Recent Labs  Lab 03/21/19 1833 03/21/19 1925 03/22/19 0612  NA 142  --  138  K <2.0*  --  3.5  CL 119*  --  108  CO2 16*  --  20*  GLUCOSE 96  --  115*  BUN 7*  --  7*  CREATININE <0.30*  --  0.30*  CALCIUM 5.3*  --  8.3*  MG  --  1.2* 1.6*   Liver Function Tests: Recent Labs  Lab 03/21/19 1833 03/22/19 0612  AST 13* 17  ALT 8 12  ALKPHOS 55 80  BILITOT 0.2* 0.7  PROT 4.1* 5.9*  ALBUMIN 2.0* 3.1*   No results for input(s): LIPASE, AMYLASE in the last 168 hours. No results for input(s): AMMONIA in the last 168 hours. Cardiac Enzymes: No results for input(s): CKTOTAL, CKMB, CKMBINDEX, TROPONINI in the last 168 hours. BNP (last 3 results) No results for input(s): BNP in the last 8760 hours.  ProBNP (last 3 results) No results for input(s): PROBNP in the last 8760 hours.  CBG: No results for input(s): GLUCAP in the last 168 hours. Recent Results (from the past 240 hour(s))  Surgical pcr screen     Status: None   Collection Time: 03/22/19  5:28 AM  Result Value Ref Range Status   MRSA, PCR NEGATIVE NEGATIVE Final   Staphylococcus aureus NEGATIVE NEGATIVE Final    Comment: (NOTE) The Xpert SA Assay (FDA approved for NASAL specimens in patients 72 years of age and older), is one component of a comprehensive surveillance program. It is not intended to diagnose infection nor to guide or monitor treatment. Performed at [facility], [address] [place], [place] [address]      Studies: Dg Chest 1 View  Result Date: 03/21/2019 CLINICAL DATA:  Fall EXAM: CHEST  1 VIEW COMPARISON:  07/24/2018 FINDINGS: Heart size and vascularity normal. Atherosclerotic aortic arch. Lungs are clear without infiltrate or effusion. No pneumothorax. Apical pleural scarring bilaterally. IMPRESSION: No active disease. Electronically Signed   By: Marlan Palauharles  Clark M.D.   On: 03/21/2019 18:00   Dg Hip Unilat W Or Wo  Pelvis 2-3 Views Left  Result Date: 03/21/2019 CLINICAL DATA:  Multiple recent falls. EXAM: DG HIP (WITH OR WITHOUT PELVIS) 2-3V LEFT COMPARISON:  CT abdomen and pelvis 01/31/2019. Bilateral hip radiographs 04/10/2018. FINDINGS: There is an intertrochanteric fracture of the left femur with at most minimal displacement. No hip dislocation is evident. Atherosclerotic vascular calcifications are noted. Speckled calcification in the pelvis corresponds to uterine fibroid on CT. IMPRESSION: Left intertrochanteric femur fracture. Electronically Signed   By: Sebastian AcheAllen  Grady M.D.   On: 03/21/2019 17:56    Scheduled Meds: . docusate sodium  100 mg Oral BID  . enoxaparin (LOVENOX) injection  40 mg Subcutaneous Q24H  . mirtazapine  7.5 mg Oral QHS  . polyethylene glycol  17 g Oral Daily    Continuous Infusions: . sodium chloride 100 mL/hr at 03/22/19 0546  . magnesium sulfate 1 - 4 g bolus IVPB    . methocarbamol (ROBAXIN) IV  Joycelyn Das, MD  [facility] 03/22/2019

## 2019-03-22 NOTE — Progress Notes (Signed)
Initial Nutrition Assessment  DOCUMENTATION CODES:   Underweight  INTERVENTION:   Once diet advanced: -Recommend Monitor magnesium, potassium, and phosphorus daily for at least 3 days, MD to replete as needed, as pt is at risk for refeeding syndrome given suspected malnutrition, poor PO intakes PTA, ETOH abuse, low K/Mg levels at admission.  -Recommend Ensure Enlive po TID, each supplement provides 350 kcal and 20 grams of protein -Recommend daily MVI  -Recommend SLP consult/evaluation given history of dysphagia  NUTRITION DIAGNOSIS:   Increased nutrient needs related to post-op healing(ETOH abuse) as evidenced by estimated needs.  GOAL:   Patient will meet greater than or equal to 90% of their needs  MONITOR:   Diet advancement, Labs, Weight trends, I & O's  REASON FOR ASSESSMENT:   Consult Hip fracture protocol  ASSESSMENT:   73 y.o. female, with history of hypertension, hyperlipidemia, anxiety who came to hospital after patient fell at home.  Patient states that she had 3 falls in last 3 days. Admitted for hip fracture.  **RD working remotely**  Patient awaiting hip surgery scheduled for this afternoon, currently NPO. Pt with history of ETOH abuse, daily shots of brandy. During a previous admission in August 2019, pt was placed on a dysphagia 2 diet with nectar thick liquids. Recommend SLP evaluated following diet advancement given history of dysphagia.   Per notes from office visit at [facility], pt has had poor appetite and nausea when eating for months now. Weights have stayed between 88-93 lb since October 2019. Uncertain if current weight of 110 lb is stated or measured. Suspect that pt continues to be severely malnourished but unable to diagnose at this time. Pt is at refeeding syndrome risk.  Medications: Remeron tablet daily, IV Mg sulfate, IV KCl  Labs reviewed: Low Mg  NUTRITION - FOCUSED PHYSICAL EXAM:  Unable to perform per department requirements to work  remotely.  Diet Order:   Diet Order            Diet NPO time specified  Diet effective midnight              EDUCATION NEEDS:   Not appropriate for education at this time  Skin:  Skin Assessment: Reviewed RN Assessment  Last BM:  4/7  Height:   Ht Readings from Last 1 Encounters:  03/21/19 5\' 6"  (1.676 m)    Weight:   Wt Readings from Last 1 Encounters:  03/21/19 49.9 kg    Ideal Body Weight:  59.1 kg  BMI:  Body mass index is 17.75 kg/m.  Estimated Nutritional Needs:   Kcal:  1500-1700  Protein:  70-80g  Fluid:  1.7L/day  Tilda Franco, MS, RD, LDN [facility] Inpatient Clinical Dietitian Pager: [phone] After Hours Pager: [phone]

## 2019-03-22 NOTE — Progress Notes (Signed)
Patients daughter made aware of updates, POC, and medications given while on 5W, all questions answered.   Melida Quitter RN, BSN

## 2019-03-22 NOTE — Op Note (Signed)
NAME: April [name], April [name]:[id number] ACCOUNT [id number] DATE OF BIRTH:1946-10-24 FACILITY: [facility] LOCATION: [facility] PHYSICIAN:CHRISTOPHER April ParrotY. BLACKMAN, MD  OPERATIVE REPORT  DATE OF PROCEDURE:  03/22/2019  PREOPERATIVE DIAGNOSIS:  Left mildly displaced intertrochanteric proximal femur fracture.  POSTOPERATIVE DIAGNOSIS:  Left mildly displaced intertrochanteric proximal femur fracture.  PROCEDURE:  Open reduction internal fixation of left intertrochanteric hip fracture.  IMPLANTS:  Biomet Affixus antegrade femoral nail measuring 9 x 36 with an 85 mm lag screw.  SURGEON:  April PandaChristopher Y. Magnus IvanBlackman, MD  ASSISTANT:  Richardean CanalGilbert Clark, PA-C.  ANESTHESIA:  General.  ANTIBIOTICS:  Two grams IV Ancef.  ESTIMATED BLOOD LOSS:  50-100 mL  COMPLICATIONS:  None.  INDICATIONS:  The patient is a 73 year old alcoholic cachectic female who sustained a mechanical fall in her house yesterday.  She was [facility] in [facility] with left hip pain and the inability to ambulate.  She was found to have a  mildly displaced left hip intertrochanteric fracture.  She also was found to be hypokalemic and had low calcium as well.  She was admitted to the medicine service and they were able to improve her electrolytes and get her stabilized for surgery from a  medical standpoint.  I talked to her and her daughter in detail about recommendations for surgery.  We had a long and thorough discussion about the risks and benefits of surgery as well.  DESCRIPTION OF PROCEDURE:  After informed consent was obtained and appropriate left hip was marked, she was brought to the operating room where general anesthesia was obtained while she was on her stretcher.  She then had a Foley catheter was placed and  she was placed supine on the fracture table, the perineal post in place and her left operative leg in line skeletal traction.  The right nonoperative leg was placed in a well leg holder with  appropriate positioning and padding, especially in the  popliteal area.  We then assessed her fracture under direct fluoroscopy prior to prepping and draping.  We were able to have a fracture in reduced position.  We also chose our femoral nail, keeping it sterile within the box, laying it over the femur so  we could get an idea of our length and nail diameter size.  With that being said, we chose a 9 x 360 femoral nail passing that off to the back table sterilely so it could be placed to the outrigger guide.  We then prepped the left hip with ChloraPrep and  sterile drapes.  A time-out was called and we identified correct patient, correct left hip.  We then made an incision just proximal to the greater trochanter and dissected down to the tip of greater trochanter.  A temporary guide pin was then placed  under direct fluoroscopic guidance and we appreciated the position into the femoral canal.  We then used initiating reamer to open the femoral canal and removed the guide pin.  I then was able to place the long 9 x 360 femoral nail down the canal without  difficulty and without needing to ream.  We verified its placement again under direct fluoroscopy.  Using the outrigger guide and a separate lateral incision, we were able to then place a temporary guide pin from the lateral cortex of the femur  traversing the fracture in the femoral neck into a position of good bone quality in the inferior femoral neck and slightly central posterior.  We then took a measurement off of this and drilled  for an 85 mm lag screw.  We placed a lag screw without  difficulty and then let some traction off the bed and we compressed the fracture as well, again under direct fluoroscopy.  We then removed the outrigger guide and put the hip through internal and external rotation, and it moved as a unit.  We verified  the placement of the nail from proximal to distal verifying that was in the femur and the knee.  We then irrigated the  soft tissue rinsed with normal saline solution.  We closed the deep tissue with 0 Vicryl followed by closure of the subcutaneous tissue  with 2-0 Vicryl and interrupted staples closed the skin.  Xeroform and Aquacel dressing was applied.  She was taken off the [place] fracture table, awakened, extubated, and taken to recovery room in stable condition.  All final counts were correct.  There  were no complications noted.  Of note, April Edison, PA-C, assisted in the entire case.  His assistance was very crucial and helpful for facilitating all aspects of this case.  TN/NUANCE  D:03/22/2019 T:03/22/2019 JOB:[id number]

## 2019-03-22 NOTE — Progress Notes (Signed)
Lab technician reports that the patient refuses her labs to be drawn. Patient was assessed and noticed to be confused stating she  doesn't know where she at and what happened, patient's daughter was called and was able to talked to the patient. Patient was oriented by RN and questions  has been answered . We will continue to monitor.

## 2019-03-22 NOTE — Progress Notes (Signed)
Patient ID: April [name], female   DOB: 08/14/1946, 73 y.o.   MRN: [id number] The patient's hypokalemia is resolved with a potassium being 3.5 this morning.  Her calcium is improved as well.  I have her on the schedule this afternoon for fixation of her left hip intertrochanteric fracture.  My plan will be to proceed with surgery if she is medically clear.

## 2019-03-22 NOTE — Anesthesia Procedure Notes (Signed)
Procedure Name: Intubation Date/Time: 03/22/2019 3:11 PM Performed by: Anne Fu, CRNA Pre-anesthesia Checklist: Patient identified, Emergency Drugs available, Suction available, Patient being monitored and Timeout performed Patient Re-evaluated:Patient Re-evaluated prior to induction Oxygen Delivery Method: Circle system utilized Preoxygenation: Pre-oxygenation with 100% oxygen Induction Type: IV induction, Rapid sequence and Cricoid Pressure applied Laryngoscope Size: Mac and 4 Grade View: Grade II Tube type: Oral Tube size: 7.0 mm Number of attempts: 1 Airway Equipment and Method: Stylet Placement Confirmation: ETT inserted through vocal cords under direct vision,  positive ETCO2 and breath sounds checked- equal and bilateral Secured at: 21 cm Tube secured with: Tape Dental Injury: Teeth and Oropharynx as per pre-operative assessment  Difficulty Due To: Difficult Airway- due to limited oral opening

## 2019-03-23 ENCOUNTER — Encounter (HOSPITAL_COMMUNITY): Payer: Self-pay | Admitting: Orthopaedic Surgery

## 2019-03-23 DIAGNOSIS — Y92009 Unspecified place in unspecified non-institutional (private) residence as the place of occurrence of the external cause: Secondary | ICD-10-CM

## 2019-03-23 DIAGNOSIS — W19XXXA Unspecified fall, initial encounter: Secondary | ICD-10-CM

## 2019-03-23 DIAGNOSIS — E876 Hypokalemia: Secondary | ICD-10-CM

## 2019-03-23 LAB — CBC
HCT: 33 % — ABNORMAL LOW (ref 36.0–46.0)
Hemoglobin: 10.7 g/dL — ABNORMAL LOW (ref 12.0–15.0)
MCH: 35.9 pg — ABNORMAL HIGH (ref 26.0–34.0)
MCHC: 32.4 g/dL (ref 30.0–36.0)
MCV: 110.7 fL — ABNORMAL HIGH (ref 80.0–100.0)
Platelets: 261 10*3/uL (ref 150–400)
RBC: 2.98 MIL/uL — ABNORMAL LOW (ref 3.87–5.11)
RDW: 14 % (ref 11.5–15.5)
WBC: 10 10*3/uL (ref 4.0–10.5)
nRBC: 0 % (ref 0.0–0.2)

## 2019-03-23 LAB — BASIC METABOLIC PANEL
Anion gap: 12 (ref 5–15)
BUN: 5 mg/dL — ABNORMAL LOW (ref 8–23)
CO2: 20 mmol/L — ABNORMAL LOW (ref 22–32)
Calcium: 7.8 mg/dL — ABNORMAL LOW (ref 8.9–10.3)
Chloride: 102 mmol/L (ref 98–111)
Creatinine, Ser: 0.34 mg/dL — ABNORMAL LOW (ref 0.44–1.00)
GFR calc Af Amer: >60 mL/min (ref >60)
GFR calc non Af Amer: >60 mL/min (ref >60)
Glucose, Bld: 120 mg/dL — ABNORMAL HIGH (ref 70–99)
Potassium: 2.9 mmol/L — ABNORMAL LOW (ref 3.5–5.1)
Sodium: 134 mmol/L — ABNORMAL LOW (ref 135–145)

## 2019-03-23 LAB — PHOSPHORUS: Phosphorus: 2.8 mg/dL (ref 2.5–4.6)

## 2019-03-23 LAB — MAGNESIUM: Magnesium: 1.7 mg/dL (ref 1.7–2.4)

## 2019-03-23 MED ORDER — POTASSIUM CHLORIDE CRYS ER 20 MEQ PO TBCR
40.0000 meq | EXTENDED_RELEASE_TABLET | Freq: Three times a day (TID) | ORAL | Status: DC
  Administered 2019-03-23 – 2019-03-24 (×4): 40 meq via ORAL
  Filled 2019-03-23 (×4): qty 2

## 2019-03-23 MED ORDER — LORAZEPAM 2 MG/ML IJ SOLN: 1.0000 mg | Freq: Four times a day (QID) | INTRAMUSCULAR | Status: DC | PRN

## 2019-03-23 MED ORDER — THIAMINE HCL 100 MG/ML IJ SOLN: 100.0000 mg | Freq: Every day | INTRAMUSCULAR | Status: DC

## 2019-03-23 MED ORDER — FOLIC ACID 1 MG PO TABS
1.0000 mg | ORAL_TABLET | Freq: Every day | ORAL | Status: DC
  Administered 2019-03-23 – 2019-03-24 (×2): 1 mg via ORAL
  Filled 2019-03-23 (×2): qty 1

## 2019-03-23 MED ORDER — NICOTINE 21 MG/24HR TD PT24
21.0000 mg | MEDICATED_PATCH | Freq: Every day | TRANSDERMAL | Status: DC
  Administered 2019-03-23 – 2019-03-24 (×2): 21 mg via TRANSDERMAL
  Filled 2019-03-23 (×2): qty 1

## 2019-03-23 MED ORDER — ADULT MULTIVITAMIN W/MINERALS CH
1.0000 | ORAL_TABLET | Freq: Every day | ORAL | Status: DC
  Administered 2019-03-23 – 2019-03-24 (×2): 1 via ORAL
  Filled 2019-03-23 (×2): qty 1

## 2019-03-23 MED ORDER — CALCIUM CARBONATE-VITAMIN D 500-200 MG-UNIT PO TABS
1.0000 | ORAL_TABLET | Freq: Two times a day (BID) | ORAL | Status: DC
  Administered 2019-03-23 – 2019-03-24 (×3): 1 via ORAL
  Filled 2019-03-23 (×3): qty 1

## 2019-03-23 MED ORDER — MAGNESIUM OXIDE 400 (241.3 MG) MG PO TABS
400.0000 mg | ORAL_TABLET | Freq: Two times a day (BID) | ORAL | Status: DC
  Administered 2019-03-23 – 2019-03-24 (×3): 400 mg via ORAL
  Filled 2019-03-23 (×3): qty 1

## 2019-03-23 MED ORDER — LORAZEPAM 1 MG PO TABS: 1.0000 mg | ORAL_TABLET | ORAL | Status: DC | PRN

## 2019-03-23 MED ORDER — VITAMIN B-1 100 MG PO TABS
100.0000 mg | ORAL_TABLET | Freq: Every day | ORAL | Status: DC
  Administered 2019-03-23 – 2019-03-24 (×2): 100 mg via ORAL
  Filled 2019-03-23 (×2): qty 1

## 2019-03-23 MED ORDER — LORAZEPAM 1 MG PO TABS
1.0000 mg | ORAL_TABLET | Freq: Four times a day (QID) | ORAL | Status: DC | PRN
  Administered 2019-03-24: 04:00:00 1 mg via ORAL
  Filled 2019-03-23 (×2): qty 1

## 2019-03-23 MED ORDER — ENSURE ENLIVE PO LIQD
237.0000 mL | Freq: Three times a day (TID) | ORAL | Status: DC
  Administered 2019-03-23 – 2019-03-24 (×4): 237 mL via ORAL

## 2019-03-23 NOTE — Progress Notes (Signed)
Physical Therapy Treatment Patient Details Name: April [name] MRN: [id number] DOB: 1946-05-16 Today's Date: 03/23/2019    History of Present Illness Pt s/p multiple falls with L hip fx and s/p ORIF.  Pt with hx of neuropathy and ETOh abuse.    PT Comments    Initiated therex program with pt;  Pt requiring increased time and multiple rests for task completion.  OOB deferred 2* pt fatigue.     Follow Up Recommendations  Home health PT;Follow surgeon's recommendation for DC plan and follow-up therapies     Equipment Recommendations  None recommended by PT    Recommendations for Other Services OT consult     Precautions / Restrictions Precautions Precautions: Fall Restrictions Weight Bearing Restrictions: No Other Position/Activity Restrictions: WBAT    Mobility  Bed Mobility Overal bed mobility: Needs Assistance Bed Mobility: Supine to Sit     Supine to sit: Min assist;Mod assist     General bed mobility comments: mod assist x 2 to move up in bed  Transfers Overall transfer level: Needs assistance Equipment used: Rolling walker (2 wheeled) Transfers: Sit to/from Stand Sit to Stand: Min assist;Mod assist;+2 physical assistance;+2 safety/equipment;From elevated surface         General transfer comment: cues for transition position and use of UEs to self assist  Ambulation/Gait Ambulation/Gait assistance: Min assist;Mod assist;+2 safety/equipment Gait Distance (Feet): 4 Feet Assistive device: Rolling walker (2 wheeled) Gait Pattern/deviations: Step-to pattern;Decreased step length - right;Decreased step length - left;Decreased stance time - left;Shuffle;Trunk flexed Gait velocity: decr   General Gait Details: increased time with cues for sequence, posture, position from RW and increased UE WB 2* L LE buckling   Stairs             Wheelchair Mobility    Modified Rankin (Stroke Patients Only)       Balance Overall balance assessment: Needs  assistance Sitting-balance support: No upper extremity supported;Feet supported Sitting balance-Leahy Scale: Good     Standing balance support: Bilateral upper extremity supported Standing balance-Leahy Scale: Poor                              Cognition Arousal/Alertness: Awake/alert Behavior During Therapy: WFL for tasks assessed/performed Overall Cognitive Status: Within Functional Limits for tasks assessed                                        Exercises Total Joint Exercises Ankle Circles/Pumps: AROM;Both;15 reps;Supine Quad Sets: AAROM;AROM;Both;10 reps;Supine Heel Slides: AAROM;Left;20 reps;Supine Hip ABduction/ADduction: AAROM;Left;15 reps;Supine    General Comments        Pertinent Vitals/Pain Pain Assessment: Faces Faces Pain Scale: Hurts little more Pain Location: L hip with movement Pain Descriptors / Indicators: Guarding;Grimacing;Sore Pain Intervention(s): Limited activity within patient's tolerance;Monitored during session;RN gave pain meds during session    Home Living Family/patient expects to be discharged to:: Private residence Living Arrangements: Children Available Help at Discharge: Family;Available 24 hours/day Type of Home: House Home Access: Stairs to enter Entrance Stairs-Rails: Right;Left;Can reach both Home Layout: One level Home Equipment: Environmental consultant - 2 wheels;Cane - single point;Bedside commode      Prior Function Level of Independence: Independent          PT Goals (current goals can now be found in the care plan section) Acute Rehab PT Goals Patient Stated Goal: Regain IND PT  Goal Formulation: With patient Time For Goal Achievement: 04/06/19 Potential to Achieve Goals: Good Progress towards PT goals: Not progressing toward goals - comment(fatigued)    Frequency    7X/week      PT Plan Current plan remains appropriate    Co-evaluation              AM-PAC PT "6 Clicks" Mobility   Outcome  Measure  Help needed turning from your back to your side while in a flat bed without using bedrails?: A Lot Help needed moving from lying on your back to sitting on the side of a flat bed without using bedrails?: A Lot Help needed moving to and from a bed to a chair (including a wheelchair)?: A Lot Help needed standing up from a chair using your arms (e.g., wheelchair or bedside chair)?: A Lot Help needed to walk in hospital room?: A Lot Help needed climbing 3-5 steps with a railing? : Total 6 Click Score: 11    End of Session Equipment Utilized During Treatment: Gait belt Activity Tolerance: Patient tolerated treatment well;Patient limited by fatigue Patient left: in bed;with call bell/phone within reach;with nursing/sitter in room Nurse Communication: Mobility status PT Visit Diagnosis: Difficulty in walking, not elsewhere classified (R26.2)     Time: [id number] PT Time Calculation (min) (ACUTE ONLY): 20 min  Charges:  $Therapeutic Exercise: 8-22 mins                     Mauro KaufmannHunter Shakia Sebastiano PT Acute Rehabilitation Services Pager [phone] Office [phone]    April [name] 03/23/2019, 3:36 PM

## 2019-03-23 NOTE — Progress Notes (Signed)
OT Cancellation Note  Patient Details Name: April [name] MRN: [id number] DOB: 1946/09/27   Cancelled Treatment:    Reason Eval/Treat Not Completed: Other (comment). Attempted twice:  Pt just back to bed both times:  Had been up in chair then just used commode.  Rashell Shambaugh 03/23/2019, 2:51 PM  Marica Otter, OTR/L Acute Rehabilitation Services [phone] [facility] pager [phone] office 03/23/2019

## 2019-03-23 NOTE — Progress Notes (Signed)
Pt unable to void post foley removal. Bladder scan showed . Pt assisted to [facility] and only voided .  Post void residual was 276, and pt stated she still felt discomfort.  MD paged and made aware.  I/O order placed and performed.  yellow urine drained. RN will monitor.

## 2019-03-23 NOTE — Progress Notes (Signed)
PROGRESS NOTE  April [name] NOT:[id number] DOB: [birth year] DOA: 03/21/2019 PCP: Richmond Campbell., PA-C   LOS: 2 days   Brief narrative:  April [name]  is a 73 y.o. female, with history of hypertension, hyperlipidemia, anxiety who presented to hospital after patient fell at home.  Patient states that she had 3 falls in last 3 days.  She became dizzy and lightheaded and then fell onto her left side.  She denied hitting her head.  In the ED, x-ray of the hip showed left intertrochanteric femur fracture.  Patient was then admitted to the hospital for surgical intervention.  Subjective: Today, patient complains of mild nausea.  She does have mild leg pain at the operative site.  Denies any nausea, vomiting abdominal pain.  Assessment/Plan:  Active Problems:   Hip fracture (HCC)   Displaced intertrochanteric fracture of left femur, initial encounter for closed fracture [facility])  Left intertrochanteric femur fracture- Status post left hip intervention with intramedullary nail placement by orthopedics Dr. Magnus Ivan on 03/22/2019.  Management as per orthopedics.  Continue antispasmodics.  We will have physical therapy, Occupational therapy evaluation.  Weightbearing as tolerated as per orthopedics.  Hypokalemia-patient presented with severe hypokalemia with potassium less than 2.0.  Improved potassium levels after aggressive replacement.. Potassium today is 2.9.  We will continue to aggressively replenish.  Currently on IV potassium and will add p.o. potassium.    Hypocalcemia- Corrected calcium for low albumin on presentation was 6.9.  Patient given calcium gluconate in the ED.We will closely monitor calcium levels.  We will supplement her calcium as well.  Magnesium level is 1.7 today we will supplement as well.  Check phosphorus level.  Prolonged QTC on presentation.  Likely from severe metabolic abnormalities including hypokalemia, hypocalcemia, mild hypomagnesemia. Improved QTc interval on  repeat EKG yesterday.  History of alcohol abuse, poor nutrition.  Risk of refeeding syndrome.  Nutrition on board.Continue Ensure supplements.  Will add multivitamins.  Will get a speech consultation due to history of dysphagia.  No signs of withdrawal noted at this time.  We will add Ativan as needed for anxiety  History of cigarette smoking.  We will put the patient on nicotine patch.  History of anxiety.  Continue on Klonopin.   VTE Prophylaxis: Lovenox  Code Status: Full code  Family Communication: I had a prolonged discussion with the patient's daughter Luna Kitchens on the phone regarding the patient's condition and the plans for disposition.  At this time, physical therapy evaluation is pending.  Patient's daughter states that the patient would probably prefer to come home with physical therapy.  Patient's daughter currently lives with her at home and she is available to provide help if needed.  Disposition Plan:   Pending physical therapy and Occupational Therapy evaluation.  Rehabilitation facility/home with home health   Consultants:  Orthopedics  Procedures: Status post left hip intervention with intramedullary nail placement by orthopedics Dr. Magnus Ivan on 03/22/2019.   Antibiotics: Anti-infectives (From admission, onward)   Start     Dose/Rate Route Frequency Ordered Stop   03/22/19 1428  ceFAZolin (ANCEF) 2-4 GM/100ML-% IVPB    Note to Pharmacy:  Sharyn Creamer   : cabinet override      03/22/19 1428 03/23/19 0229      Objective: Vitals:   03/23/19 0210 03/23/19 0550  BP: 129/76 134/69  Pulse: 95 96  Resp: 16 16  Temp: 98.2 F (36.8 C) 97.9 F (36.6 C)  SpO2: 100% 100%    Intake/Output Summary (Last 24  hours) at 03/23/2019 1015 Last data filed at 03/23/2019 0700 Gross per 24 hour  Intake 1841.25 ml  Output 2775 ml  Net -933.75 ml   Filed Weights   03/21/19 1622 03/22/19 1430  Weight: 49.9 kg 49.9 kg   Body mass index is 17.75 kg/m.   Physical Exam:  General: Thinly built , not in obvious distress, on nasal cannula HENT: Normocephalic, pupils equally reacting to light and accommodation.  No scleral pallor or icterus noted. Oral mucosa is moist.  Chest:  Clear breath sounds.  Diminished breath sounds bilaterally. No crackles or wheezes.  CVS: S1 &S2 heard. No murmur.  Regular rate and rhythm. Abdomen: Soft, nontender, nondistended.  Bowel sounds are heard.  Liver is not palpable, no abdominal mass palpated Extremities: No cyanosis, clubbing or edema.  Peripheral pulses are palpable.  Left lower extremity with dressing from recent hip surgery. Psych: Alert, awake and oriented, normal mood CNS:  No cranial nerve deficits.  Power equal in all extremities.  No sensory deficits noted.  No cerebellar signs.   Skin: Warm and dry.  Left hip dressing .  Data Review: I have personally reviewed the following laboratory data and studies,  CBC: Recent Labs  Lab 03/21/19 1833 03/22/19 0612 03/23/19 0319  WBC 8.7 11.6* 10.0  NEUTROABS 7.0  --   --   HGB 11.5* 13.0 10.7*  HCT 32.9* 39.3 33.0*  MCV 106.1* 107.4* 110.7*  PLT 210 261 261   Basic Metabolic Panel: Recent Labs  Lab 03/21/19 1833 03/21/19 1925 03/22/19 0612 03/23/19 0319  NA 142  --  138 134*  K <2.0*  --  3.5 2.9*  CL 119*  --  108 102  CO2 16*  --  20* 20*  GLUCOSE 96  --  115* 120*  BUN 7*  --  7* 5*  CREATININE <0.30*  --  0.30* 0.34*  CALCIUM 5.3*  --  8.3* 7.8*  MG  --  1.2* 1.6* 1.7   Liver Function Tests: Recent Labs  Lab 03/21/19 1833 03/22/19 0612  AST 13* 17  ALT 8 12  ALKPHOS 55 80  BILITOT 0.2* 0.7  PROT 4.1* 5.9*  ALBUMIN 2.0* 3.1*   No results for input(s): LIPASE, AMYLASE in the last 168 hours. No results for input(s): AMMONIA in the last 168 hours. Cardiac Enzymes: No results for input(s): CKTOTAL, CKMB, CKMBINDEX, TROPONINI in the last 168 hours. BNP (last 3 results) No results for input(s): BNP in the last 8760 hours.  ProBNP (last 3  results) No results for input(s): PROBNP in the last 8760 hours.  CBG: No results for input(s): GLUCAP in the last 168 hours. Recent Results (from the past 240 hour(s))  Urine Culture     Status: Abnormal   Collection Time: 03/21/19  6:15 PM  Result Value Ref Range Status   Specimen Description   Final    URINE, RANDOM Performed at [facility], [address] [place], [place] [address]    Special Requests   Final    NONE Performed at [facility], [address], [place], [place] [address]    Culture MULTIPLE SPECIES PRESENT, SUGGEST RECOLLECTION (A)  Final   Report Status 03/22/2019 FINAL  Final  Surgical pcr screen     Status: None   Collection Time: 03/22/19  5:28 AM  Result Value Ref Range Status   MRSA, PCR NEGATIVE NEGATIVE Final   Staphylococcus aureus NEGATIVE NEGATIVE Final    Comment: (NOTE) The  Xpert SA Assay (FDA approved for NASAL specimens in patients 72 years of age and older), is one component of a comprehensive surveillance program. It is not intended to diagnose infection nor to guide or monitor treatment. Performed at [facility], [address] [place], [place] [id number]      Studies: Dg Chest 1 View  Result Date: 03/21/2019 CLINICAL DATA:  Fall EXAM: CHEST  1 VIEW COMPARISON:  07/24/2018 FINDINGS: Heart size and vascularity normal. Atherosclerotic aortic arch. Lungs are clear without infiltrate or effusion. No pneumothorax. Apical pleural scarring bilaterally. IMPRESSION: No active disease. Electronically Signed   By: Marlan Palauharles  Clark M.D.   On: 03/21/2019 18:00   Dg C-arm 1-60 Min-no Report  Result Date: 03/22/2019 Fluoroscopy was utilized by the requesting physician.  No radiographic interpretation.   Dg Hip Operative Unilat W Or W/o Pelvis Left  Result Date: 03/22/2019 CLINICAL DATA:  Left hip fracture EXAM: OPERATIVE LEFT HIP WITH PELVIS COMPARISON:  03/21/2019 FLUOROSCOPY TIME:  Radiation  Exposure Index (as provided by the fluoroscopic device): 7.9 mGy If the device does not provide the exposure index: Fluoroscopy Time:  1 minutes 12 seconds Number of Acquired Images:  6 FINDINGS: Medullary rod is noted within the left femur. Proximal fixation screws noted traversing the femoral neck. Fracture fragments are in near anatomic alignment. IMPRESSION: ORIF of proximal left femoral fracture. Electronically Signed   By: Alcide CleverMark  Lukens M.D.   On: 03/22/2019 16:45   Dg Hip Unilat W Or Wo Pelvis 2-3 Views Left  Result Date: 03/21/2019 CLINICAL DATA:  Multiple recent falls. EXAM: DG HIP (WITH OR WITHOUT PELVIS) 2-3V LEFT COMPARISON:  CT abdomen and pelvis 01/31/2019. Bilateral hip radiographs 04/10/2018. FINDINGS: There is an intertrochanteric fracture of the left femur with at most minimal displacement. No hip dislocation is evident. Atherosclerotic vascular calcifications are noted. Speckled calcification in the pelvis corresponds to uterine fibroid on CT. IMPRESSION: Left intertrochanteric femur fracture. Electronically Signed   By: Sebastian AcheAllen  Grady M.D.   On: 03/21/2019 17:56    Scheduled Meds: . aspirin EC  325 mg Oral Q breakfast  . docusate sodium  100 mg Oral BID  . enoxaparin (LOVENOX) injection  40 mg Subcutaneous Q24H  . feeding supplement (ENSURE ENLIVE)  237 mL Oral TID BM  . magnesium oxide  400 mg Oral BID  . mirtazapine  7.5 mg Oral QHS  . multivitamin with minerals  1 tablet Oral Daily  . pantoprazole  40 mg Oral Daily  . polyethylene glycol  17 g Oral Daily  . potassium chloride  40 mEq Oral TID    Continuous Infusions: . sodium chloride 100 mL/hr at 03/22/19 1807  . methocarbamol (ROBAXIN) IV    . methocarbamol (ROBAXIN) IV       Joycelyn DasLaxman Aphrodite Harpenau, MD  [facility] 03/23/2019

## 2019-03-23 NOTE — Discharge Instructions (Signed)
You may put all of your weight on your left leg/hip as comfort allows. Only be up with a walker. You can get your actual incision wet in the shower. Leave your current dressing on and in place until your Orthopedic outpatient follow-up.

## 2019-03-23 NOTE — Evaluation (Addendum)
Clinical/Bedside Swallow Evaluation Patient Details  Name: April [name] MRN: [id number] Date of Birth: [name]10/06/1946  Today's Date: 03/23/2019 Time: SLP Start Time (ACUTE ONLY): 1200 SLP Stop Time (ACUTE ONLY): 1210 SLP Time Calculation (min) (ACUTE ONLY): 10 min  Past Medical History:  Past Medical History:  Diagnosis Date  . Anxiety   . Current smoker   . Depression   . ETOH abuse   . GERD (gastroesophageal reflux disease)   . History of shingles summer 2015  . HTN (hypertension)   . Hyperlipidemia   . Hypertension   . Leg edema   . Neuropathy   . Neuropathy   . Pancreatitis    Past Surgical History:  Past Surgical History:  Procedure Laterality Date  . ERCP N/A 02/01/2017   Procedure: ENDOSCOPIC RETROGRADE CHOLANGIOPANCREATOGRAPHY (ERCP);  Surgeon: Dorena CookeyJohn Hayes, MD;  [facility];  Service: Endoscopy;  Laterality: N/A;  . INTRAMEDULLARY (IM) NAIL INTERTROCHANTERIC Left 03/22/2019   Procedure: INTRAMEDULLARY (IM) NAIL INTERTROCHANTRIC;  Surgeon: Kathryne HitchBlackman, Christopher Y, MD;  Location: [facility];  Service: Orthopedics;  Laterality: Left;  . LAPAROSCOPIC CHOLECYSTECTOMY WITH COMMON DUCT EXPLORATION AND INTRAOP CHOLANGIOGRAM N/A 02/03/2017   Procedure: LAPAROSCOPIC CHOLECYSTECTOMY WITH POSSIBLE COMMON DUCT EXPLORATION AND INTRAOP CHOLANGIOGRAM;  Surgeon: Ovidio Kinavid Newman, MD;  Location: [facility];  Service: General;  Laterality: N/A;  . removal of uterine fibroids  2004   HPI:  Pt s/p multiple falls with L hip fx and s/p ORIF.  Pt with hx of neuropathy and ETOh abuse.  Lives with family.  Pt underwent MBS August of 2019 - modified diet was recommended at the time due to aspiration of thin liquids associated with pill consumption. Pt demonstrated consistent penetration of all liquids.    Assessment / Plan / Recommendation Clinical Impression  Pt presents with functional oropharyngeal swallow marked by adequate mastication, brisk swallow response, no s/s of aspiration during three oz  water test.  Pt with remote hx of dysphagia.  Demonstrates no concerns for an acute dysphagia.  Recommend continuing current regular diet, thin liquids.  Give meds whole with puree based on history per MBS.  No SLP f/u is needed.  SLP Visit Diagnosis: Dysphagia, unspecified (R13.10)    Aspiration Risk  No limitations    Diet Recommendation   regular, thin liquids  Medication Administration: Whole meds with puree    Other  Recommendations Oral Care Recommendations: Oral care BID   Follow up Recommendations        Frequency and Duration            Prognosis        Swallow Study   General Date of Onset: 03/21/19 HPI: Pt s/p multiple falls with L hip fx and s/p ORIF.  Pt with hx of neuropathy and ETOh abuse.  Lives with family.  Pt underwent MBS August of 2019 - modified diet was recommended at the time due to aspiration of thin liquids associated with pill consumption. Pt demonstrated consistent penetration of all liquids.  Type of Study: Bedside Swallow Evaluation Previous Swallow Assessment: see HPI Diet Prior to this Study: Regular;Thin liquids Temperature Spikes Noted: No Respiratory Status: Nasal cannula History of Recent Intubation: No Behavior/Cognition: Alert;Cooperative;Pleasant mood Oral Cavity Assessment: Within Functional Limits Oral Cavity - Dentition: Adequate natural dentition;Poor condition Vision: Functional for self-feeding Self-Feeding Abilities: Able to feed self Patient Positioning: Upright in bed Baseline Vocal Quality: Normal Volitional Cough: Other (Comment)(not elicited) Volitional Swallow: Able to elicit    Oral/Motor/Sensory Function Overall Oral Motor/Sensory Function: Within  functional limits   Ice Chips Ice chips: Within functional limits   Thin Liquid Thin Liquid: Within functional limits    Nectar Thick Nectar Thick Liquid: Not tested   Honey Thick Honey Thick Liquid: Not tested   Puree Puree: Within functional limits   Solid      Solid: Within functional limits      Blenda Mounts Laurice 03/23/2019,12:54 PM  Marchelle Folks L. Samson Frederic, MA CCC/SLP Acute Rehabilitation Services Office number [phone] Pager [phone]

## 2019-03-23 NOTE — Progress Notes (Signed)
Pt presented with increased confusion, consistent HR of  110-115 upon neuro assessment.  MD paged and made aware.  CIWA scale protocol initiated.  RN will monitor.

## 2019-03-23 NOTE — Evaluation (Signed)
Physical Therapy Evaluation Patient Details Name: April [name] MRN: [id number] DOB: [birth year] Today's Date: 03/23/2019   History of Present Illness  Pt s/p multiple falls with L hip fx and s/p ORIF.  Pt with hx of neuropathy and ETOh abuse.  Clinical Impression  Pt s/p ORIF of L hip fx and presents with decreased L LE strength/ROM and post op pain limiting functional mobility.  Pt should progress to dc home with 24/7 assist of family.    Follow Up Recommendations Home health PT;Follow surgeon's recommendation for DC plan and follow-up therapies    Equipment Recommendations  None recommended by PT    Recommendations for Other Services OT consult     Precautions / Restrictions Precautions Precautions: Fall Restrictions Weight Bearing Restrictions: No Other Position/Activity Restrictions: WBAT      Mobility  Bed Mobility Overal bed mobility: Needs Assistance Bed Mobility: Supine to Sit     Supine to sit: Min assist;Mod assist     General bed mobility comments: cues for sequence and use of R LE to self assist;  Physical assist to manage L LE and to bring turnk to upright  Transfers Overall transfer level: Needs assistance Equipment used: Rolling walker (2 wheeled) Transfers: Sit to/from Stand Sit to Stand: Min assist;Mod assist;+2 physical assistance;+2 safety/equipment;From elevated surface         General transfer comment: cues for transition position and use of UEs to self assist  Ambulation/Gait Ambulation/Gait assistance: Min assist;Mod assist;+2 safety/equipment Gait Distance (Feet): 4 Feet Assistive device: Rolling walker (2 wheeled) Gait Pattern/deviations: Step-to pattern;Decreased step length - right;Decreased step length - left;Decreased stance time - left;Shuffle;Trunk flexed Gait velocity: decr   General Gait Details: increased time with cues for sequence, posture, position from RW and increased UE WB 2* L LE buckling  Stairs             Wheelchair Mobility    Modified Rankin (Stroke Patients Only)       Balance Overall balance assessment: Needs assistance Sitting-balance support: No upper extremity supported;Feet supported Sitting balance-Leahy Scale: Good     Standing balance support: Bilateral upper extremity supported Standing balance-Leahy Scale: Poor                               Pertinent Vitals/Pain Pain Assessment: Faces Faces Pain Scale: Hurts little more Pain Location: L hip with movement Pain Descriptors / Indicators: Guarding;Grimacing;Sore Pain Intervention(s): Limited activity within patient's tolerance;Monitored during session;Premedicated before session;Ice applied    Home Living Family/patient expects to be discharged to:: Private residence Living Arrangements: Children Available Help at Discharge: Family;Available 24 hours/day Type of Home: House Home Access: Stairs to enter Entrance Stairs-Rails: Right;Left;Can reach both Entrance Stairs-Number of Steps: 2 Home Layout: One level Home Equipment: Walker - 2 wheels;Cane - single point;Bedside commode      Prior Function Level of Independence: Independent               Hand Dominance        Extremity/Trunk Assessment   Upper Extremity Assessment Upper Extremity Assessment: Generalized weakness    Lower Extremity Assessment Lower Extremity Assessment: LLE deficits/detail       Communication   Communication: No difficulties  Cognition Arousal/Alertness: Awake/alert Behavior During Therapy: WFL for tasks assessed/performed Overall Cognitive Status: Within Functional Limits for tasks assessed  General Comments      Exercises     Assessment/Plan    PT Assessment Patient needs continued PT services  PT Problem List Decreased strength;Decreased range of motion;Decreased activity tolerance;Decreased balance;Decreased mobility;Decreased knowledge  of use of DME;Pain;Decreased safety awareness       PT Treatment Interventions DME instruction;Gait training;Stair training;Therapeutic exercise;Therapeutic activities;Functional mobility training;Balance training;Patient/family education    PT Goals (Current goals can be found in the Care Plan section)  Acute Rehab PT Goals Patient Stated Goal: Regain IND PT Goal Formulation: With patient Time For Goal Achievement: 04/06/19 Potential to Achieve Goals: Good    Frequency 7X/week   Barriers to discharge        Co-evaluation               AM-PAC PT "6 Clicks" Mobility  Outcome Measure Help needed turning from your back to your side while in a flat bed without using bedrails?: A Lot Help needed moving from lying on your back to sitting on the side of a flat bed without using bedrails?: A Lot Help needed moving to and from a bed to a chair (including a wheelchair)?: A Lot Help needed standing up from a chair using your arms (e.g., wheelchair or bedside chair)?: A Lot Help needed to walk in hospital room?: A Lot Help needed climbing 3-5 steps with a railing? : Total 6 Click Score: 11    End of Session Equipment Utilized During Treatment: Gait belt Activity Tolerance: Patient tolerated treatment well;Patient limited by fatigue Patient left: in chair;with call bell/phone within reach;with chair alarm set Nurse Communication: Mobility status PT Visit Diagnosis: Difficulty in walking, not elsewhere classified (R26.2)    Time: 1101-1120 PT Time Calculation (min) (ACUTE ONLY): 19 min   Charges:   PT Evaluation $PT Eval Low Complexity: 1 Low          Mauro KaufmannHunter Donyelle Enyeart PT Acute Rehabilitation Services Pager [phone] Office [phone]   Brantley Wiley 03/23/2019, 12:32 PM

## 2019-03-23 NOTE — Progress Notes (Signed)
Subjective: 1 Day Post-Op Procedure(s) (LRB): INTRAMEDULLARY (IM) NAIL INTERTROCHANTRIC (Left) Patient reports pain as moderate.  Has been up with therapy.  Tolerated surgery well yesterday afternoon.  Objective: Vital signs in last 24 hours: Temp:  [97.4 F (36.3 C)-98.3 F (36.8 C)] 97.9 F (36.6 C) (04/09 0550) Pulse Rate:  [79-112] 103 (04/09 1035) Resp:  [14-18] 14 (04/09 1035) BP: (104-146)/(65-88) 104/65 (04/09 1035) SpO2:  [99 %-100 %] 99 % (04/09 1035) Weight:  [49.9 kg] 49.9 kg (04/08 1430)  Intake/Output from previous day: 04/08 0701 - 04/09 0700 In: 1841.3 [P.O.:240; I.V.:1451.3; IV Piggyback:150] Out: 3475 [Urine:3425; Blood:50] Intake/Output this shift: Total I/O In: 923.7 [P.O.:240; I.V.:683.7] Out: 0   Recent Labs    03/21/19 1833 03/22/19 0612 03/23/19 0319  HGB 11.5* 13.0 10.7*   Recent Labs    03/22/19 0612 03/23/19 0319  WBC 11.6* 10.0  RBC 3.66* 2.98*  HCT 39.3 33.0*  PLT 261 261   Recent Labs    03/22/19 0612 03/23/19 0319  NA 138 134*  K 3.5 2.9*  CL 108 102  CO2 20* 20*  BUN 7* 5*  CREATININE 0.30* 0.34*  GLUCOSE 115* 120*  CALCIUM 8.3* 7.8*   No results for input(s): LABPT, INR in the last 72 hours.  Sensation intact distally Intact pulses distally Dorsiflexion/Plantar flexion intact Incision: dressing C/D/I   Assessment/Plan: 1 Day Post-Op Procedure(s) (LRB): INTRAMEDULLARY (IM) NAIL INTERTROCHANTRIC (Left) Up with therapy  WBAT left hip. Disposition depends on progress with therapy.      Kathryne Hitch 03/23/2019, 11:52 AM

## 2019-03-24 DIAGNOSIS — R636 Underweight: Secondary | ICD-10-CM

## 2019-03-24 LAB — CBC
HCT: 31.4 % — ABNORMAL LOW (ref 36.0–46.0)
Hemoglobin: 10.6 g/dL — ABNORMAL LOW (ref 12.0–15.0)
MCH: 36.9 pg — ABNORMAL HIGH (ref 26.0–34.0)
MCHC: 33.8 g/dL (ref 30.0–36.0)
MCV: 109.4 fL — ABNORMAL HIGH (ref 80.0–100.0)
Platelets: 278 10*3/uL (ref 150–400)
RBC: 2.87 MIL/uL — ABNORMAL LOW (ref 3.87–5.11)
RDW: 13.5 % (ref 11.5–15.5)
WBC: 9.4 10*3/uL (ref 4.0–10.5)
nRBC: 0 % (ref 0.0–0.2)

## 2019-03-24 LAB — COMPREHENSIVE METABOLIC PANEL
ALT: 13 U/L (ref 0–44)
AST: 21 U/L (ref 15–41)
Albumin: 2.5 g/dL — ABNORMAL LOW (ref 3.5–5.0)
Alkaline Phosphatase: 86 U/L (ref 38–126)
Anion gap: 9 (ref 5–15)
BUN: 9 mg/dL (ref 8–23)
CO2: 23 mmol/L (ref 22–32)
Calcium: 8.2 mg/dL — ABNORMAL LOW (ref 8.9–10.3)
Chloride: 102 mmol/L (ref 98–111)
Creatinine, Ser: 0.41 mg/dL — ABNORMAL LOW (ref 0.44–1.00)
GFR calc Af Amer: >60 mL/min (ref >60)
GFR calc non Af Amer: >60 mL/min (ref >60)
Glucose, Bld: 296 mg/dL — ABNORMAL HIGH (ref 70–99)
Potassium: 4.3 mmol/L (ref 3.5–5.1)
Sodium: 134 mmol/L — ABNORMAL LOW (ref 135–145)
Total Bilirubin: 0.7 mg/dL (ref 0.3–1.2)
Total Protein: 5.3 g/dL — ABNORMAL LOW (ref 6.5–8.1)

## 2019-03-24 LAB — MAGNESIUM: Magnesium: 1.8 mg/dL (ref 1.7–2.4)

## 2019-03-24 LAB — PHOSPHORUS: Phosphorus: 1.4 mg/dL — ABNORMAL LOW (ref 2.5–4.6)

## 2019-03-24 MED ORDER — OXYCODONE-ACETAMINOPHEN 7.5-325 MG PO TABS: 1.0000 | ORAL_TABLET | Freq: Four times a day (QID) | ORAL | 0 refills | Status: AC | PRN

## 2019-03-24 MED ORDER — ONDANSETRON HCL 4 MG PO TABS: 4.0000 mg | ORAL_TABLET | Freq: Four times a day (QID) | ORAL | 0 refills | Status: AC | PRN

## 2019-03-24 MED ORDER — POTASSIUM & SODIUM PHOSPHATES 280-160-250 MG PO PACK: 1.0000 | PACK | Freq: Two times a day (BID) | ORAL | 0 refills | Status: AC

## 2019-03-24 MED ORDER — CALCIUM CARBONATE-VITAMIN D 500-200 MG-UNIT PO TABS: 1.0000 | ORAL_TABLET | Freq: Two times a day (BID) | ORAL | 2 refills | Status: AC

## 2019-03-24 MED ORDER — METHOCARBAMOL 500 MG PO TABS: 500.0000 mg | ORAL_TABLET | Freq: Four times a day (QID) | ORAL | 0 refills | Status: DC | PRN

## 2019-03-24 MED ORDER — PANTOPRAZOLE SODIUM 40 MG PO TBEC: 40.0000 mg | DELAYED_RELEASE_TABLET | Freq: Every day | ORAL | 0 refills | Status: AC

## 2019-03-24 MED ORDER — FOLIC ACID 1 MG PO TABS: 1.0000 mg | ORAL_TABLET | Freq: Every day | ORAL | 2 refills | Status: AC

## 2019-03-24 MED ORDER — ASPIRIN 325 MG PO TBEC: 325.0000 mg | DELAYED_RELEASE_TABLET | Freq: Every day | ORAL | 0 refills | Status: AC

## 2019-03-24 MED ORDER — MAGNESIUM OXIDE 400 (241.3 MG) MG PO TABS: 400.0000 mg | ORAL_TABLET | Freq: Two times a day (BID) | ORAL | 0 refills | Status: AC

## 2019-03-24 MED ORDER — OXYCODONE-ACETAMINOPHEN 7.5-325 MG PO TABS: 1.0000 | ORAL_TABLET | Freq: Four times a day (QID) | ORAL | 0 refills | Status: DC | PRN

## 2019-03-24 MED ORDER — THIAMINE HCL 100 MG PO TABS: 100.0000 mg | ORAL_TABLET | Freq: Every day | ORAL | 0 refills | Status: AC

## 2019-03-24 MED ORDER — POTASSIUM & SODIUM PHOSPHATES 280-160-250 MG PO PACK
1.0000 | PACK | Freq: Three times a day (TID) | ORAL | Status: DC
  Administered 2019-03-24: 12:00:00 1 via ORAL
  Filled 2019-03-24 (×2): qty 1

## 2019-03-24 NOTE — Progress Notes (Signed)
PT Cancellation Note  Patient Details Name: April [name] MRN: [id number] DOB: Mar 26, 1946   Cancelled Treatment:     PT deferred this am, pt extremely fatigued following session with OT.  Will follow in pm.   Carollyn Etcheverry 03/24/2019, 12:32 PM

## 2019-03-24 NOTE — Evaluation (Signed)
Occupational Therapy Evaluation Patient Details Name: April PoissonDeborah S [name] MRN: [id number] DOB: 04/10/1946 Today's Date: 03/24/2019    History of Present Illness Pt s/p multiple falls with L hip fx and s/p ORIF.  Pt with hx of neuropathy and ETOh abuse.   Clinical Impression   This 73 year old female was admitted for the above. At baseline, she is independent with adls.  She needs a lot of assistance.  Pt was limited by pain at time of evaluation; she was also confused about where she was at the end of the session. Will follow in acute setting with min A level goals    Follow Up Recommendations  Supervision/Assistance - 24 hour    Equipment Recommendations  None recommended by OT(has 3:1)    Recommendations for Other Services       Precautions / Restrictions Precautions Precautions: Fall Restrictions Other Position/Activity Restrictions: WBAT      Mobility Bed Mobility         Supine to sit: Mod assist;Max assist     General bed mobility comments: extra time; pt was positioned far on L side of bed:  wanted to get up on R side.    Transfers   Equipment used: Rolling walker (2 wheeled)   Sit to Stand: Min assist;Mod assist;From elevated surface         General transfer comment: cues for hand placement, safety    Balance                                           ADL either performed or assessed with clinical judgement   ADL Overall ADL's : Needs assistance/impaired Eating/Feeding: Independent   Grooming: Set up   Upper Body Bathing: Set up   Lower Body Bathing: Maximal assistance;Sit to/from stand   Upper Body Dressing : Minimal assistance   Lower Body Dressing: Total assistance;Sit to/from stand                 General ADL Comments: [facility], but once she stood, she said she couldn't do it.  Limited by pain for adls--simulated this visit as pt did not feel up to performing.  Educated on concept of AE vs  having daughter assist at home     Vision         Perception     Praxis      Pertinent Vitals/Pain Pain Assessment: Faces Faces Pain Scale: Hurts whole lot Pain Location: L hip with movement Pain Descriptors / Indicators: Guarding;Grimacing;Sore Pain Intervention(s): Limited activity within patient's tolerance;Monitored during session;Premedicated before session;Repositioned;Ice applied     Hand Dominance Right   Extremity/Trunk Assessment Upper Extremity Assessment Upper Extremity Assessment: Generalized weakness           Communication Communication Communication: No difficulties   Cognition Arousal/Alertness: Awake/alert Behavior During Therapy: WFL for tasks assessed/performed Overall Cognitive Status: Impaired/Different from baseline                                 General Comments: At end of session, pt believed she was home and that daughter was in the living room   General Comments       Exercises     Shoulder Instructions      Home Living Family/patient expects to be discharged to:: Private residence Living  Arrangements: Children Available Help at Discharge: Family;Available 24 hours/day               Bathroom Shower/Tub: Chief Strategy Officer: Standard     Home Equipment: Environmental consultant - 2 wheels;Cane - single point;Bedside commode          Prior Functioning/Environment Level of Independence: Independent                 OT Problem List: Decreased strength;Decreased activity tolerance;Decreased cognition;Decreased safety awareness;Pain;Decreased knowledge of use of DME or AE      OT Treatment/Interventions: Self-care/ADL training;DME and/or AE instruction;Patient/family education;Therapeutic activities    OT Goals(Current goals can be found in the care plan section) Acute Rehab OT Goals Patient Stated Goal: Regain IND OT Goal Formulation: With patient Time For Goal Achievement: 04/07/19 Potential to  Achieve Goals: Good ADL Goals Pt Will Transfer to Toilet: with min assist;bedside commode;stand pivot transfer Pt Will Perform Toileting - Clothing Manipulation and hygiene: with min assist;sit to/from stand Additional ADL Goal #1: pt will verbalize vs demonstrate use of AE and tub bench Additional ADL Goal #2: pt will perform bed mobility from flat bed with min A in preparation for adls  OT Frequency: Min 2X/week   Barriers to D/C:            Co-evaluation              AM-PAC OT "6 Clicks" Daily Activity     Outcome Measure Help from another person eating meals?: None Help from another person taking care of personal grooming?: A Little Help from another person toileting, which includes using toliet, bedpan, or urinal?: A Lot Help from another person bathing (including washing, rinsing, drying)?: A Lot Help from another person to put on and taking off regular upper body clothing?: A Little Help from another person to put on and taking off regular lower body clothing?: Total 6 Click Score: 15   End of Session    Activity Tolerance: Patient tolerated treatment well Patient left: in bed;with call bell/phone within reach;with bed alarm set  OT Visit Diagnosis: Pain Pain - Right/Left: Left Pain - part of body: Hip                Time: 6734-1937 OT Time Calculation (min): 24 min Charges:  OT General Charges $OT Visit: 1 Visit OT Evaluation $OT Eval Low Complexity: 1 Low OT Treatments $Therapeutic Activity: 8-22 mins  Marica Otter, OTR/L Acute Rehabilitation Services [phone] [facility] pager [phone] office 03/24/2019  April [name] 03/24/2019, 11:29 AM

## 2019-03-24 NOTE — Discharge Summary (Signed)
Physician Discharge Summary  April [name] KMQ:[id number] DOB: 08-20-1946 DOA: 03/21/2019  PCP: Richmond Campbell., PA-C  Admit date: 03/21/2019 Discharge date: 03/24/2019  Admitted From: Home  Discharge disposition: Home with home health   Recommendations for Outpatient Follow-Up:   Follow up with your primary care provider in one week.  Follow-up with orthopedic surgery within 2 weeks   Discharge Diagnosis:   Active Problems:   Hip fracture (HCC)   Displaced intertrochanteric fracture of left femur, initial encounter for closed fracture [facility])  Discharge Condition: Improved.  Diet recommendation: Low sodium,  Code status: Full.   History of Present Illness:   DeborahCrateris a73 y.o.female,with history of hypertension, hyperlipidemia, anxiety who presented to hospital after patient fell at home.Patient states that she had 3 falls in last 3 days. She became dizzy and lightheaded and then fell onto her left side. She denied hitting her head. In the ED, x-ray of the hip showed left intertrochanteric femur fracture.  Patient was then admitted to the hospital for surgical intervention.  Hospital Course:  Patient was admitted to hospital and following conditions were addressed during hospitalization,  Left intertrochanteric femur fracture- Status post left hip intervention with intramedullary nail placement by orthopedics Dr. Magnus Ivan on 03/22/2019.  Weightbearing as tolerated as per orthopedics.  Patient was seen by physical therapy and recommended home health PT on discharge.  Patient will need to follow-up with Dr. Magnus Ivan after surgery for postoperative follow-up.  Hypokalemia-patient presented with severe hypokalemia with potassium less than 2.0 presentation likely secondary to alcohol abuse and poor oral intake.  Improved potassium levels after aggressive replacement.  Patient will be continued on potassium supplements on discharge  Hypocalcemia on  presentation-Corrected calcium for low albumin on presentation was 6.9.Patient given calcium gluconate in the ED. continue on vitamin D and calcium on discharge.  Prolonged QTC on presentation.  Likely from severe metabolic abnormalities including hypokalemia, hypocalcemia, mild hypomagnesemia. Improved QTc interval on repeat EKG. electrolytes will continue to be replenished on discharge and primary care physician to follow-up  History of alcohol abuse, poor nutrition.  Risk of refeeding syndrome.  Nutrition on board.Continue Ensure supplements.  Will add multivitamins.  Will get a speech consultation due to history of dysphagia.  No signs of withdrawal noted at this time.  We will add Ativan as needed for anxiety  Underweight due to low nutritional intake, present on admission.   Nutrition followed the patient. Advised nutritional supplements.   History of cigarette smoking.    Was advised against smoking.  He was given nicotine patch while in the hospital  History of anxiety.  Continue home Klonopin.   Disposition.  At this time patient is stable for disposition home.  Spoke with the patient daughter Ms. Bobbi Bolen on the phone and updated her about the clinical condition of the patient and the plan for disposition.   Medical Consultants:    Orthopedic surgery Dr. Magnus Ivan   Subjective:   Today, patient feels okay.  Has ambulated to the bathroom.  Denies any shortness of breath, cough, or chills or fever.  Discharge Exam:   Vitals:   03/24/19 0329 03/24/19 0604  BP:  (!) 142/85  Pulse: (!) 106 (!) 101  Resp:  17  Temp:  98.9 F (37.2 C)  SpO2: 96% 96%   Vitals:   03/24/19 0049 03/24/19 0250 03/24/19 0329 03/24/19 0604  BP: 133/80 127/72  (!) 142/85  Pulse: (!) 117 (!) 115 (!) 106 (!) 101  Resp: 16 16  17  Temp: 99.3 F (37.4 C) 99.6 F (37.6 C)  98.9 F (37.2 C)  TempSrc: Oral Oral  Oral  SpO2: 97% 96% 96% 96%  Weight:      Height:        General exam:  Appears calm and comfortable ,Not in distress, thinly built. HEENT:PERRL,Oral mucosa moist Respiratory system: Bilateral equal air entry, normal vesicular breath sounds, no wheezes or crackles  Cardiovascular system: S1 & S2 heard, RRR.  Gastrointestinal system: Abdomen is nondistended, soft and nontender. No organomegaly or masses felt. Normal bowel sounds heard. Central nervous system: Alert and oriented. No focal neurological deficits. Extremities: No edema, no clubbing ,no cyanosis, distal peripheral pulses palpable.  Left hip with dressing from surgery Skin:   Left hip dressing MSK: Normal muscle bulk,tone ,power    Procedures:    Status post left hip intervention with intramedullary nail placement by orthopedics Dr. Magnus Ivan on 03/22/2019  The results of significant diagnostics from this hospitalization (including imaging, microbiology, ancillary and laboratory) are listed below for reference.     Diagnostic Studies:   Dg Chest 1 View  Result Date: 03/21/2019 CLINICAL DATA:  Fall EXAM: CHEST  1 VIEW COMPARISON:  07/24/2018 FINDINGS: Heart size and vascularity normal. Atherosclerotic aortic arch. Lungs are clear without infiltrate or effusion. No pneumothorax. Apical pleural scarring bilaterally. IMPRESSION: No active disease. Electronically Signed   By: Marlan Palau M.D.   On: 03/21/2019 18:00   Dg C-arm 1-60 Min-no Report  Result Date: 03/22/2019 Fluoroscopy was utilized by the requesting physician.  No radiographic interpretation.   Dg Hip Operative Unilat W Or W/o Pelvis Left  Result Date: 03/22/2019 CLINICAL DATA:  Left hip fracture EXAM: OPERATIVE LEFT HIP WITH PELVIS COMPARISON:  03/21/2019 FLUOROSCOPY TIME:  Radiation Exposure Index (as provided by the fluoroscopic device): 7.9 mGy If the device does not provide the exposure index: Fluoroscopy Time:  1 minutes 12 seconds Number of Acquired Images:  6 FINDINGS: Medullary rod is noted within the left femur. Proximal fixation  screws noted traversing the femoral neck. Fracture fragments are in near anatomic alignment. IMPRESSION: ORIF of proximal left femoral fracture. Electronically Signed   By: Alcide Clever M.D.   On: 03/22/2019 16:45   Dg Hip Unilat W Or Wo Pelvis 2-3 Views Left  Result Date: 03/21/2019 CLINICAL DATA:  Multiple recent falls. EXAM: DG HIP (WITH OR WITHOUT PELVIS) 2-3V LEFT COMPARISON:  CT abdomen and pelvis 01/31/2019. Bilateral hip radiographs 04/10/2018. FINDINGS: There is an intertrochanteric fracture of the left femur with at most minimal displacement. No hip dislocation is evident. Atherosclerotic vascular calcifications are noted. Speckled calcification in the pelvis corresponds to uterine fibroid on CT. IMPRESSION: Left intertrochanteric femur fracture. Electronically Signed   By: Sebastian Ache M.D.   On: 03/21/2019 17:56     Labs:   Basic Metabolic Panel: Recent Labs  Lab 03/21/19 1833 03/21/19 1925 03/22/19 0612 03/23/19 0319 03/24/19 0310  NA 142  --  138 134* 134*  K <2.0*  --  3.5 2.9* 4.3  CL 119*  --  108 102 102  CO2 16*  --  20* 20* 23  GLUCOSE 96  --  115* 120* 296*  BUN 7*  --  7* 5* 9  CREATININE <0.30*  --  0.30* 0.34* 0.41*  CALCIUM 5.3*  --  8.3* 7.8* 8.2*  MG  --  1.2* 1.6* 1.7 1.8  PHOS  --   --   --  2.8 1.4*   GFR Estimated  Creatinine Clearance: 50.8 mL/min (A) (by C-G formula based on SCr of 0.41 mg/dL (L)). Liver Function Tests: Recent Labs  Lab 03/21/19 1833 03/22/19 0612 03/24/19 0310  AST 13* 17 21  ALT 8 12 13   ALKPHOS 55 80 86  BILITOT 0.2* 0.7 0.7  PROT 4.1* 5.9* 5.3*  ALBUMIN 2.0* 3.1* 2.5*   No results for input(s): LIPASE, AMYLASE in the last 168 hours. No results for input(s): AMMONIA in the last 168 hours. Coagulation profile No results for input(s): INR, PROTIME in the last 168 hours.  CBC: Recent Labs  Lab 03/21/19 1833 03/22/19 0612 03/23/19 0319 03/24/19 0310  WBC 8.7 11.6* 10.0 9.4  NEUTROABS 7.0  --   --   --   HGB 11.5*  13.0 10.7* 10.6*  HCT 32.9* 39.3 33.0* 31.4*  MCV 106.1* 107.4* 110.7* 109.4*  PLT 210 261 261 278   Cardiac Enzymes: No results for input(s): CKTOTAL, CKMB, CKMBINDEX, TROPONINI in the last 168 hours. BNP: Invalid input(s): POCBNP CBG: No results for input(s): GLUCAP in the last 168 hours. D-Dimer No results for input(s): DDIMER in the last 72 hours. Hgb A1c No results for input(s): HGBA1C in the last 72 hours. Lipid Profile No results for input(s): CHOL, HDL, LDLCALC, TRIG, CHOLHDL, LDLDIRECT in the last 72 hours. Thyroid function studies No results for input(s): TSH, T4TOTAL, T3FREE, THYROIDAB in the last 72 hours.  Invalid input(s): FREET3 Anemia work up No results for input(s): VITAMINB12, FOLATE, FERRITIN, TIBC, IRON, RETICCTPCT in the last 72 hours. Microbiology Recent Results (from the past 240 hour(s))  Urine Culture     Status: Abnormal   Collection Time: 03/21/19  6:15 PM  Result Value Ref Range Status   Specimen Description   Final    URINE, RANDOM Performed at [facility], [address] [place], [place] [address]    Special Requests   Final    NONE Performed at [facility], [address] [place], [place] [address]    Culture MULTIPLE SPECIES PRESENT, SUGGEST RECOLLECTION (A)  Final   Report Status 03/22/2019 FINAL  Final  Surgical pcr screen     Status: None   Collection Time: 03/22/19  5:28 AM  Result Value Ref Range Status   MRSA, PCR NEGATIVE NEGATIVE Final   Staphylococcus aureus NEGATIVE NEGATIVE Final    Comment: (NOTE) The Xpert SA Assay (FDA approved for NASAL specimens in patients 72 years of age and older), is one component of a comprehensive surveillance program. It is not intended to diagnose infection nor to guide or monitor treatment. Performed at [facility], [address] [place], [place] [address]      Discharge Instructions:   Discharge Instructions    Call MD for:   redness, tenderness, or signs of infection (pain, swelling, redness, odor or green/yellow discharge around incision site)   Complete by:  As directed    Call MD for:  severe uncontrolled pain   Complete by:  As directed    Call MD for:  temperature >100.4   Complete by:  As directed    Diet - low sodium heart healthy   Complete by:  As directed    Discharge instructions   Complete by:  As directed    Follow-up with your primary care physician within 1 week.  Continue to take medications as prescribed.  Please do not drink alcohol.  Medications taken with alcohol can cause serious side effects.  Continue physical therapy at home.  Follow-up with orthopedics Dr. Magnus Ivan (post operative follow-up) in 2 weeks.     Allergies as of 03/24/2019      Reactions   Naltrexone Shortness Of Breath   Rocephin [ceftriaxone Sodium In Dextrose] Anaphylaxis   Penicillins Rash   Has patient had a PCN reaction causing immediate rash, facial/tongue/throat swelling, SOB or lightheadedness with hypotension: Yes immediate rash  Has patient had a PCN reaction causing severe rash involving mucus membranes or skin necrosis: Yes Has patient had a PCN reaction that required hospitalization": No Has patient had a PCN reaction occurring within the last 10 years: Yes If all of the above answers are "NO", then may proceed with Cephalosporin use.      Medication List    STOP taking these medications   BC HEADACHE POWDER PO   Resource ThickenUp Clear Powd     TAKE these medications   aspirin 325 MG EC tablet Take 1 tablet (325 mg total) by mouth daily with breakfast.   calcium-vitamin D 500-200 MG-UNIT tablet Commonly known as:  OSCAL WITH D Take 1 tablet by mouth 2 (two) times daily.   clonazePAM 0.5 MG tablet Commonly known as:  KLONOPIN Take 0.5 mg by mouth 2 (two) times daily as needed for anxiety. as directed   feeding supplement (ENSURE ENLIVE) Liqd Take 237 mLs by mouth 2 (two) times daily between  meals.   folic acid 1 MG tablet Commonly known as:  FOLVITE Take 1 tablet (1 mg total) by mouth daily.   magnesium oxide 400 (241.3 Mg) MG tablet Commonly known as:  MAG-OX Take 1 tablet (400 mg total) by mouth 2 (two) times daily.   methocarbamol 500 MG tablet Commonly known as:  ROBAXIN Take 1 tablet (500 mg total) by mouth every 6 (six) hours as needed for muscle spasms.   multivitamin with minerals Tabs tablet Take 1 tablet by mouth daily.   ondansetron 4 MG tablet Commonly known as:  ZOFRAN Take 1 tablet (4 mg total) by mouth every 6 (six) hours as needed for nausea.   oxyCODONE-acetaminophen 7.5-325 MG tablet Commonly known as:  Percocet Take 1 tablet by mouth every 6 (six) hours as needed for up to 5 days for moderate pain or severe pain.   pantoprazole 40 MG tablet Commonly known as:  PROTONIX Take 1 tablet (40 mg total) by mouth daily for 15 days.   polyethylene glycol 17 g packet Commonly known as:  MIRALAX / GLYCOLAX Take 17 g by mouth daily.   potassium & sodium phosphates 280-160-250 MG Pack Commonly known as:  PHOS-NAK Take 1 packet by mouth 2 (two) times daily after a meal for 5 days.   potassium chloride 10 MEQ tablet Commonly known as:  K-DUR,KLOR-CON Take 10 mEq by mouth 2 (two) times daily.   thiamine 100 MG tablet Take 1 tablet (100 mg total) by mouth daily.      Follow-up Information    Kathryne Hitch, MD. Schedule an appointment as soon as possible for a visit in 2 week(s).   Specialty:  Orthopedic Surgery Contact information: [address] [place] [address] [phone]        Richmond Campbell., PA-C. Schedule an appointment as soon as possible for a visit in 1 week(s).   Specialty:  Family Medicine Why:  check Blood work including electrolytes Contact information: [address] [place] [address] [phone]          Time coordinating discharge: 39 minutes  Signed:  Anamarie Hunn  [facility] 03/24/2019, 10:06 AM

## 2019-03-24 NOTE — Care Management Important Message (Signed)
Important Message  Patient Details IM Letter given to Case Manager to present to the Patient Name: April [name] MRN: [id number] Date of Birth: 1946/01/15   Medicare Important Message Given:  Yes    Caren Macadam 03/24/2019, 10:45 AMImportant Message  Patient Details  Name: April [name] MRN: [id number] Date of Birth: January 18, 1946   Medicare Important Message Given:  Yes    Caren Macadam 03/24/2019, 10:45 AM

## 2019-03-24 NOTE — Progress Notes (Signed)
Subjective: 2 Days Post-Op Procedure(s) (LRB): INTRAMEDULLARY (IM) NAIL INTERTROCHANTRIC (Left) Patient reports pain as moderate.  Working with therapy on mobility.  Trying to work on nutritional status.  Objective: Vital signs in last 24 hours: Temp:  [97.9 F (36.6 C)-99.6 F (37.6 C)] 98.9 F (37.2 C) (04/10 0604) Pulse Rate:  [101-120] 101 (04/10 0604) Resp:  [16-18] 17 (04/10 0604) BP: (102-142)/(62-85) 142/85 (04/10 0604) SpO2:  [95 %-98 %] 96 % (04/10 0604)  Intake/Output from previous day: 04/09 0701 - 04/10 0700 In: 1403.7 [P.O.:720; I.V.:683.7] Out: 1226 [Urine:1226] Intake/Output this shift: Total I/O In: 0  Out: 100 [Urine:100]  Recent Labs    03/21/19 1833 03/22/19 0612 03/23/19 0319 03/24/19 0310  HGB 11.5* 13.0 10.7* 10.6*   Recent Labs    03/23/19 0319 03/24/19 0310  WBC 10.0 9.4  RBC 2.98* 2.87*  HCT 33.0* 31.4*  PLT 261 278   Recent Labs    03/23/19 0319 03/24/19 0310  NA 134* 134*  K 2.9* 4.3  CL 102 102  CO2 20* 23  BUN 5* 9  CREATININE 0.34* 0.41*  GLUCOSE 120* 296*  CALCIUM 7.8* 8.2*   No results for input(s): LABPT, INR in the last 72 hours.  Sensation intact distally Intact pulses distally Dorsiflexion/Plantar flexion intact Incision: dressing C/D/I No cellulitis present   Assessment/Plan: 2 Days Post-Op Procedure(s) (LRB): INTRAMEDULLARY (IM) NAIL INTERTROCHANTRIC (Left) Up with therapy - WBAT left hip. Can discharge to home from ortho standpoint when deemed medically stable and HHPT and home support are established.      Kathryne Hitch 03/24/2019, 11:05 AM

## 2019-03-24 NOTE — TOC Transition Note (Signed)
Transition of Care [facility]) - CM/SW Discharge Note   Patient Details  Name: April [name] MRN: [id number] Date of Birth: January 14, 1946  [facility] CM/SW Contact:  Golda Acre, RN Phone Number: 03/24/2019, 10:22 AM   Clinical Narrative:    Discharge to  Home with [facility] requested for services.   Final next level of care: Home w Home Health Services Barriers to Discharge: No Barriers Identified   Patient Goals and CMS Choice Patient states their goals for this hospitalization and ongoing recovery are:: just to go home CMS Medicare.gov Compare Post Acute Care list provided to:: Patient Choice offered to / list presented to : Patient  Discharge Placement                       Discharge Plan and Services   Discharge Planning Services: CM Consult Post Acute Care Choice: Home Health                [facility] Agency: [facility]   Social Determinants of Health (SDOH) Interventions     Readmission Risk Interventions No flowsheet data found.

## 2019-03-24 NOTE — Progress Notes (Signed)
Pt is to be discharged home. Patient has voided post bladder scan of . Pt has emptied bladder. IV removed. Pt's discharge instructions given.

## 2019-03-25 DIAGNOSIS — R636 Underweight: Secondary | ICD-10-CM | POA: Diagnosis not present

## 2019-03-25 DIAGNOSIS — E46 Unspecified protein-calorie malnutrition: Secondary | ICD-10-CM | POA: Diagnosis not present

## 2019-03-25 DIAGNOSIS — F419 Anxiety disorder, unspecified: Secondary | ICD-10-CM | POA: Diagnosis not present

## 2019-03-25 DIAGNOSIS — R296 Repeated falls: Secondary | ICD-10-CM | POA: Diagnosis not present

## 2019-03-25 DIAGNOSIS — F17210 Nicotine dependence, cigarettes, uncomplicated: Secondary | ICD-10-CM | POA: Diagnosis not present

## 2019-03-25 DIAGNOSIS — S72142D Displaced intertrochanteric fracture of left femur, subsequent encounter for closed fracture with routine healing: Secondary | ICD-10-CM | POA: Diagnosis not present

## 2019-03-25 DIAGNOSIS — Z7982 Long term (current) use of aspirin: Secondary | ICD-10-CM | POA: Diagnosis not present

## 2019-03-25 DIAGNOSIS — I10 Essential (primary) hypertension: Secondary | ICD-10-CM | POA: Diagnosis not present

## 2019-03-25 DIAGNOSIS — F1011 Alcohol abuse, in remission: Secondary | ICD-10-CM | POA: Diagnosis not present

## 2019-03-25 DIAGNOSIS — E785 Hyperlipidemia, unspecified: Secondary | ICD-10-CM | POA: Diagnosis not present

## 2019-03-25 DIAGNOSIS — Z9181 History of falling: Secondary | ICD-10-CM | POA: Diagnosis not present

## 2019-03-25 NOTE — Anesthesia Postprocedure Evaluation (Signed)
Anesthesia Post Note  Patient: April [name]  Procedure(s) Performed: INTRAMEDULLARY (IM) NAIL INTERTROCHANTRIC (Left Hip)     Patient location during evaluation: PACU Anesthesia Type: General Level of consciousness: awake and alert Pain management: pain level controlled Vital Signs Assessment: post-procedure vital signs reviewed and stable Respiratory status: spontaneous breathing, nonlabored ventilation, respiratory function stable and patient connected to nasal cannula oxygen Cardiovascular status: blood pressure returned to baseline and stable Postop Assessment: no apparent nausea or vomiting Anesthetic complications: no    Last Vitals:  Vitals:   03/24/19 0329 03/24/19 0604  BP:  (!) 142/85  Pulse: (!) 106 (!) 101  Resp:  17  Temp:  37.2 C  SpO2: 96% 96%    Last Pain:  Vitals:   03/24/19 0604  TempSrc: Oral  PainSc:    Pain Goal: Patients Stated Pain Goal: 1 (03/24/19 0150)                 Beryle Lathe

## 2019-03-28 ENCOUNTER — Telehealth (INDEPENDENT_AMBULATORY_CARE_PROVIDER_SITE_OTHER): Payer: Self-pay | Admitting: Orthopaedic Surgery

## 2019-03-28 NOTE — Telephone Encounter (Signed)
April [name] from [facility] called to request VO for [facility] PT for 1x a week for 1 week and 3x a week for 2 weeks.  CB#[phone].  You can leave a message on her voicemail.  Thank you.

## 2019-03-28 NOTE — Telephone Encounter (Signed)
Verbal order left on VM  

## 2019-04-05 ENCOUNTER — Emergency Department (HOSPITAL_COMMUNITY): Payer: PPO

## 2019-04-05 ENCOUNTER — Inpatient Hospital Stay (INDEPENDENT_AMBULATORY_CARE_PROVIDER_SITE_OTHER): Payer: Self-pay | Admitting: Orthopaedic Surgery

## 2019-04-05 ENCOUNTER — Other Ambulatory Visit (INDEPENDENT_AMBULATORY_CARE_PROVIDER_SITE_OTHER): Payer: Self-pay | Admitting: Orthopaedic Surgery

## 2019-04-05 ENCOUNTER — Telehealth (INDEPENDENT_AMBULATORY_CARE_PROVIDER_SITE_OTHER): Payer: Self-pay | Admitting: Orthopaedic Surgery

## 2019-04-05 ENCOUNTER — Inpatient Hospital Stay (HOSPITAL_COMMUNITY): Payer: PPO

## 2019-04-05 ENCOUNTER — Inpatient Hospital Stay (HOSPITAL_COMMUNITY)
Admission: EM | Admit: 2019-04-05 | Discharge: 2019-05-15 | DRG: 853 | Disposition: E | Payer: PPO | Attending: Internal Medicine | Admitting: Internal Medicine

## 2019-04-05 ENCOUNTER — Other Ambulatory Visit: Payer: Self-pay

## 2019-04-05 DIAGNOSIS — J9690 Respiratory failure, unspecified, unspecified whether with hypoxia or hypercapnia: Secondary | ICD-10-CM

## 2019-04-05 DIAGNOSIS — M25552 Pain in left hip: Secondary | ICD-10-CM | POA: Diagnosis not present

## 2019-04-05 DIAGNOSIS — K631 Perforation of intestine (nontraumatic): Secondary | ICD-10-CM | POA: Diagnosis not present

## 2019-04-05 DIAGNOSIS — E43 Unspecified severe protein-calorie malnutrition: Secondary | ICD-10-CM | POA: Diagnosis present

## 2019-04-05 DIAGNOSIS — K859 Acute pancreatitis without necrosis or infection, unspecified: Secondary | ICD-10-CM

## 2019-04-05 DIAGNOSIS — K219 Gastro-esophageal reflux disease without esophagitis: Secondary | ICD-10-CM | POA: Diagnosis present

## 2019-04-05 DIAGNOSIS — K661 Hemoperitoneum: Secondary | ICD-10-CM | POA: Diagnosis not present

## 2019-04-05 DIAGNOSIS — J9602 Acute respiratory failure with hypercapnia: Secondary | ICD-10-CM | POA: Diagnosis not present

## 2019-04-05 DIAGNOSIS — K920 Hematemesis: Secondary | ICD-10-CM | POA: Diagnosis not present

## 2019-04-05 DIAGNOSIS — R578 Other shock: Secondary | ICD-10-CM | POA: Diagnosis not present

## 2019-04-05 DIAGNOSIS — M25551 Pain in right hip: Secondary | ICD-10-CM | POA: Diagnosis present

## 2019-04-05 DIAGNOSIS — I5181 Takotsubo syndrome: Secondary | ICD-10-CM | POA: Diagnosis present

## 2019-04-05 DIAGNOSIS — K56609 Unspecified intestinal obstruction, unspecified as to partial versus complete obstruction: Secondary | ICD-10-CM

## 2019-04-05 DIAGNOSIS — K852 Alcohol induced acute pancreatitis without necrosis or infection: Secondary | ICD-10-CM

## 2019-04-05 DIAGNOSIS — E876 Hypokalemia: Secondary | ICD-10-CM | POA: Diagnosis not present

## 2019-04-05 DIAGNOSIS — E871 Hypo-osmolality and hyponatremia: Secondary | ICD-10-CM | POA: Diagnosis not present

## 2019-04-05 DIAGNOSIS — J189 Pneumonia, unspecified organism: Secondary | ICD-10-CM

## 2019-04-05 DIAGNOSIS — R509 Fever, unspecified: Secondary | ICD-10-CM

## 2019-04-05 DIAGNOSIS — R64 Cachexia: Secondary | ICD-10-CM | POA: Diagnosis present

## 2019-04-05 DIAGNOSIS — K861 Other chronic pancreatitis: Secondary | ICD-10-CM | POA: Diagnosis not present

## 2019-04-05 DIAGNOSIS — Z1623 Resistance to quinolones and fluoroquinolones: Secondary | ICD-10-CM | POA: Diagnosis not present

## 2019-04-05 DIAGNOSIS — Z79891 Long term (current) use of opiate analgesic: Secondary | ICD-10-CM

## 2019-04-05 DIAGNOSIS — I4891 Unspecified atrial fibrillation: Secondary | ICD-10-CM | POA: Diagnosis present

## 2019-04-05 DIAGNOSIS — D539 Nutritional anemia, unspecified: Secondary | ICD-10-CM | POA: Diagnosis present

## 2019-04-05 DIAGNOSIS — I81 Portal vein thrombosis: Secondary | ICD-10-CM

## 2019-04-05 DIAGNOSIS — K8501 Idiopathic acute pancreatitis with uninfected necrosis: Secondary | ICD-10-CM | POA: Diagnosis not present

## 2019-04-05 DIAGNOSIS — Z681 Body mass index (BMI) 19 or less, adult: Secondary | ICD-10-CM | POA: Diagnosis not present

## 2019-04-05 DIAGNOSIS — M25559 Pain in unspecified hip: Secondary | ICD-10-CM | POA: Diagnosis present

## 2019-04-05 DIAGNOSIS — B964 Proteus (mirabilis) (morganii) as the cause of diseases classified elsewhere: Secondary | ICD-10-CM | POA: Diagnosis present

## 2019-04-05 DIAGNOSIS — T80818A Extravasation of other vesicant agent, initial encounter: Secondary | ICD-10-CM | POA: Diagnosis not present

## 2019-04-05 DIAGNOSIS — R739 Hyperglycemia, unspecified: Secondary | ICD-10-CM | POA: Diagnosis not present

## 2019-04-05 DIAGNOSIS — Z452 Encounter for adjustment and management of vascular access device: Secondary | ICD-10-CM

## 2019-04-05 DIAGNOSIS — R1310 Dysphagia, unspecified: Secondary | ICD-10-CM | POA: Diagnosis not present

## 2019-04-05 DIAGNOSIS — K7689 Other specified diseases of liver: Secondary | ICD-10-CM | POA: Diagnosis present

## 2019-04-05 DIAGNOSIS — J90 Pleural effusion, not elsewhere classified: Secondary | ICD-10-CM

## 2019-04-05 DIAGNOSIS — Z888 Allergy status to other drugs, medicaments and biological substances status: Secondary | ICD-10-CM

## 2019-04-05 DIAGNOSIS — Z7189 Other specified counseling: Secondary | ICD-10-CM | POA: Diagnosis not present

## 2019-04-05 DIAGNOSIS — R160 Hepatomegaly, not elsewhere classified: Secondary | ICD-10-CM | POA: Diagnosis present

## 2019-04-05 DIAGNOSIS — Z515 Encounter for palliative care: Secondary | ICD-10-CM | POA: Diagnosis not present

## 2019-04-05 DIAGNOSIS — I5023 Acute on chronic systolic (congestive) heart failure: Secondary | ICD-10-CM | POA: Diagnosis present

## 2019-04-05 DIAGNOSIS — Z87892 Personal history of anaphylaxis: Secondary | ICD-10-CM

## 2019-04-05 DIAGNOSIS — I5021 Acute systolic (congestive) heart failure: Secondary | ICD-10-CM | POA: Diagnosis not present

## 2019-04-05 DIAGNOSIS — R918 Other nonspecific abnormal finding of lung field: Secondary | ICD-10-CM | POA: Diagnosis not present

## 2019-04-05 DIAGNOSIS — R579 Shock, unspecified: Secondary | ICD-10-CM

## 2019-04-05 DIAGNOSIS — J9601 Acute respiratory failure with hypoxia: Secondary | ICD-10-CM | POA: Diagnosis present

## 2019-04-05 DIAGNOSIS — N390 Urinary tract infection, site not specified: Secondary | ICD-10-CM

## 2019-04-05 DIAGNOSIS — E1165 Type 2 diabetes mellitus with hyperglycemia: Secondary | ICD-10-CM | POA: Diagnosis not present

## 2019-04-05 DIAGNOSIS — Z880 Allergy status to penicillin: Secondary | ICD-10-CM

## 2019-04-05 DIAGNOSIS — A4159 Other Gram-negative sepsis: Secondary | ICD-10-CM | POA: Diagnosis not present

## 2019-04-05 DIAGNOSIS — A419 Sepsis, unspecified organism: Secondary | ICD-10-CM | POA: Diagnosis not present

## 2019-04-05 DIAGNOSIS — Z7982 Long term (current) use of aspirin: Secondary | ICD-10-CM

## 2019-04-05 DIAGNOSIS — Z881 Allergy status to other antibiotic agents status: Secondary | ICD-10-CM

## 2019-04-05 DIAGNOSIS — E785 Hyperlipidemia, unspecified: Secondary | ICD-10-CM | POA: Diagnosis present

## 2019-04-05 DIAGNOSIS — R571 Hypovolemic shock: Secondary | ICD-10-CM | POA: Diagnosis not present

## 2019-04-05 DIAGNOSIS — K8591 Acute pancreatitis with uninfected necrosis, unspecified: Secondary | ICD-10-CM | POA: Diagnosis not present

## 2019-04-05 DIAGNOSIS — Z8619 Personal history of other infectious and parasitic diseases: Secondary | ICD-10-CM

## 2019-04-05 DIAGNOSIS — J439 Emphysema, unspecified: Secondary | ICD-10-CM | POA: Diagnosis not present

## 2019-04-05 DIAGNOSIS — Z8249 Family history of ischemic heart disease and other diseases of the circulatory system: Secondary | ICD-10-CM

## 2019-04-05 DIAGNOSIS — R198 Other specified symptoms and signs involving the digestive system and abdomen: Secondary | ICD-10-CM

## 2019-04-05 DIAGNOSIS — R109 Unspecified abdominal pain: Secondary | ICD-10-CM | POA: Diagnosis not present

## 2019-04-05 DIAGNOSIS — L8990 Pressure ulcer of unspecified site, unspecified stage: Secondary | ICD-10-CM

## 2019-04-05 DIAGNOSIS — R0600 Dyspnea, unspecified: Secondary | ICD-10-CM

## 2019-04-05 DIAGNOSIS — E872 Acidosis: Secondary | ICD-10-CM | POA: Diagnosis present

## 2019-04-05 DIAGNOSIS — I361 Nonrheumatic tricuspid (valve) insufficiency: Secondary | ICD-10-CM | POA: Diagnosis not present

## 2019-04-05 DIAGNOSIS — G92 Toxic encephalopathy: Secondary | ICD-10-CM | POA: Diagnosis not present

## 2019-04-05 DIAGNOSIS — D62 Acute posthemorrhagic anemia: Secondary | ICD-10-CM | POA: Diagnosis not present

## 2019-04-05 DIAGNOSIS — F319 Bipolar disorder, unspecified: Secondary | ICD-10-CM | POA: Diagnosis present

## 2019-04-05 DIAGNOSIS — Z9049 Acquired absence of other specified parts of digestive tract: Secondary | ICD-10-CM

## 2019-04-05 DIAGNOSIS — N3000 Acute cystitis without hematuria: Secondary | ICD-10-CM | POA: Diagnosis not present

## 2019-04-05 DIAGNOSIS — K598 Other specified functional intestinal disorders: Secondary | ICD-10-CM | POA: Diagnosis not present

## 2019-04-05 DIAGNOSIS — K863 Pseudocyst of pancreas: Secondary | ICD-10-CM

## 2019-04-05 DIAGNOSIS — R0689 Other abnormalities of breathing: Secondary | ICD-10-CM | POA: Diagnosis not present

## 2019-04-05 DIAGNOSIS — R52 Pain, unspecified: Secondary | ICD-10-CM | POA: Diagnosis not present

## 2019-04-05 DIAGNOSIS — R1084 Generalized abdominal pain: Secondary | ICD-10-CM | POA: Diagnosis not present

## 2019-04-05 DIAGNOSIS — F05 Delirium due to known physiological condition: Secondary | ICD-10-CM | POA: Diagnosis present

## 2019-04-05 DIAGNOSIS — Z79899 Other long term (current) drug therapy: Secondary | ICD-10-CM

## 2019-04-05 DIAGNOSIS — I110 Hypertensive heart disease with heart failure: Secondary | ICD-10-CM | POA: Diagnosis present

## 2019-04-05 DIAGNOSIS — F1020 Alcohol dependence, uncomplicated: Secondary | ICD-10-CM | POA: Diagnosis present

## 2019-04-05 DIAGNOSIS — R0602 Shortness of breath: Secondary | ICD-10-CM | POA: Diagnosis not present

## 2019-04-05 DIAGNOSIS — F17210 Nicotine dependence, cigarettes, uncomplicated: Secondary | ICD-10-CM | POA: Diagnosis present

## 2019-04-05 DIAGNOSIS — K922 Gastrointestinal hemorrhage, unspecified: Secondary | ICD-10-CM | POA: Diagnosis not present

## 2019-04-05 DIAGNOSIS — F419 Anxiety disorder, unspecified: Secondary | ICD-10-CM | POA: Diagnosis present

## 2019-04-05 DIAGNOSIS — Z66 Do not resuscitate: Secondary | ICD-10-CM | POA: Diagnosis not present

## 2019-04-05 LAB — COMPREHENSIVE METABOLIC PANEL
ALT: 37 U/L (ref 0–44)
AST: 51 U/L — ABNORMAL HIGH (ref 15–41)
Albumin: 2.3 g/dL — ABNORMAL LOW (ref 3.5–5.0)
Alkaline Phosphatase: 215 U/L — ABNORMAL HIGH (ref 38–126)
Anion gap: 11 (ref 5–15)
BUN: 32 mg/dL — ABNORMAL HIGH (ref 8–23)
CO2: 21 mmol/L — ABNORMAL LOW (ref 22–32)
Calcium: 7.8 mg/dL — ABNORMAL LOW (ref 8.9–10.3)
Chloride: 100 mmol/L (ref 98–111)
Creatinine, Ser: 0.63 mg/dL (ref 0.44–1.00)
GFR calc Af Amer: >60 mL/min (ref >60)
GFR calc non Af Amer: >60 mL/min (ref >60)
Glucose, Bld: 277 mg/dL — ABNORMAL HIGH (ref 70–99)
Potassium: 3 mmol/L — ABNORMAL LOW (ref 3.5–5.1)
Sodium: 132 mmol/L — ABNORMAL LOW (ref 135–145)
Total Bilirubin: 0.5 mg/dL (ref 0.3–1.2)
Total Protein: 5.3 g/dL — ABNORMAL LOW (ref 6.5–8.1)

## 2019-04-05 LAB — URINALYSIS, ROUTINE W REFLEX MICROSCOPIC
Bilirubin Urine: NEGATIVE
Glucose, UA: NEGATIVE mg/dL
Ketones, ur: NEGATIVE mg/dL
Nitrite: POSITIVE — AB
Protein, ur: NEGATIVE mg/dL
Specific Gravity, Urine: 1.017 (ref 1.005–1.030)
WBC, UA: >50 WBC/hpf — ABNORMAL HIGH (ref 0–5)
pH: 7 (ref 5.0–8.0)

## 2019-04-05 LAB — CBC WITH DIFFERENTIAL/PLATELET
Abs Immature Granulocytes: 0.34 10*3/uL — ABNORMAL HIGH (ref 0.00–0.07)
Basophils Absolute: 0.1 10*3/uL (ref 0.0–0.1)
Basophils Relative: 0 %
Eosinophils Absolute: 0 10*3/uL (ref 0.0–0.5)
Eosinophils Relative: 0 %
HCT: 28.7 % — ABNORMAL LOW (ref 36.0–46.0)
Hemoglobin: 9.3 g/dL — ABNORMAL LOW (ref 12.0–15.0)
Immature Granulocytes: 2 %
Lymphocytes Relative: 3 %
Lymphs Abs: 0.5 10*3/uL — ABNORMAL LOW (ref 0.7–4.0)
MCH: 35.8 pg — ABNORMAL HIGH (ref 26.0–34.0)
MCHC: 32.4 g/dL (ref 30.0–36.0)
MCV: 110.4 fL — ABNORMAL HIGH (ref 80.0–100.0)
Monocytes Absolute: 0.6 10*3/uL (ref 0.1–1.0)
Monocytes Relative: 3 %
Neutro Abs: 18 10*3/uL — ABNORMAL HIGH (ref 1.7–7.7)
Neutrophils Relative %: 92 %
Platelets: 329 10*3/uL (ref 150–400)
RBC: 2.6 MIL/uL — ABNORMAL LOW (ref 3.87–5.11)
RDW: 14.9 % (ref 11.5–15.5)
WBC Morphology: INCREASED
WBC: 19.5 10*3/uL — ABNORMAL HIGH (ref 4.0–10.5)
nRBC: 0 % (ref 0.0–0.2)

## 2019-04-05 LAB — GLUCOSE, CAPILLARY: Glucose-Capillary: 149 mg/dL — ABNORMAL HIGH (ref 70–99)

## 2019-04-05 LAB — LACTIC ACID, PLASMA
Lactic Acid, Venous: 2.9 mmol/L (ref 0.5–1.9)
Lactic Acid, Venous: 1.6 mmol/L (ref 0.5–1.9)

## 2019-04-05 LAB — LIPASE, BLOOD: Lipase: 82 U/L — ABNORMAL HIGH (ref 11–51)

## 2019-04-05 LAB — MAGNESIUM: Magnesium: 2.2 mg/dL (ref 1.7–2.4)

## 2019-04-05 LAB — BRAIN NATRIURETIC PEPTIDE: B Natriuretic Peptide: 119.5 pg/mL — ABNORMAL HIGH (ref 0.0–100.0)

## 2019-04-05 MED ORDER — CIPROFLOXACIN IN D5W 400 MG/200ML IV SOLN
400.0000 mg | Freq: Two times a day (BID) | INTRAVENOUS | Status: DC
  Administered 2019-04-06: 400 mg via INTRAVENOUS
  Filled 2019-04-05: qty 200

## 2019-04-05 MED ORDER — ONDANSETRON HCL 4 MG/2ML IJ SOLN
4.0000 mg | Freq: Once | INTRAMUSCULAR | Status: AC
  Administered 2019-04-05: 18:00:00 4 mg via INTRAVENOUS
  Filled 2019-04-05: qty 2

## 2019-04-05 MED ORDER — HEPARIN BOLUS VIA INFUSION: 4000.0000 [IU] | Freq: Once | INTRAVENOUS | Status: DC

## 2019-04-05 MED ORDER — MORPHINE SULFATE (PF) 4 MG/ML IV SOLN
4.0000 mg | Freq: Once | INTRAVENOUS | Status: AC
  Administered 2019-04-05: 18:00:00 4 mg via INTRAVENOUS
  Filled 2019-04-05: qty 1

## 2019-04-05 MED ORDER — CIPROFLOXACIN IN D5W 400 MG/200ML IV SOLN
400.0000 mg | Freq: Once | INTRAVENOUS | Status: AC
  Administered 2019-04-05: 400 mg via INTRAVENOUS
  Filled 2019-04-05: qty 200

## 2019-04-05 MED ORDER — HEPARIN BOLUS VIA INFUSION
1500.0000 [IU] | Freq: Once | INTRAVENOUS | Status: AC
  Administered 2019-04-05: 1500 [IU] via INTRAVENOUS
  Filled 2019-04-05: qty 1500

## 2019-04-05 MED ORDER — MORPHINE SULFATE (PF) 2 MG/ML IV SOLN
1.0000 mg | INTRAVENOUS | Status: DC | PRN
  Administered 2019-04-06 – 2019-04-11 (×6): 1 mg via INTRAVENOUS
  Filled 2019-04-05 (×6): qty 1

## 2019-04-05 MED ORDER — NICOTINE 21 MG/24HR TD PT24
21.0000 mg | MEDICATED_PATCH | Freq: Every day | TRANSDERMAL | Status: DC
  Administered 2019-04-05 – 2019-04-26 (×20): 21 mg via TRANSDERMAL
  Filled 2019-04-05 (×20): qty 1

## 2019-04-05 MED ORDER — ACETAMINOPHEN 650 MG RE SUPP
650.0000 mg | Freq: Four times a day (QID) | RECTAL | Status: DC | PRN
  Administered 2019-04-08: 650 mg via RECTAL
  Filled 2019-04-05: qty 1

## 2019-04-05 MED ORDER — IOHEXOL 300 MG/ML  SOLN
100.0000 mL | Freq: Once | INTRAMUSCULAR | Status: AC | PRN
  Administered 2019-04-05: 17:00:00 75 mL via INTRAVENOUS

## 2019-04-05 MED ORDER — SODIUM CHLORIDE 0.9 % IV SOLN
INTRAVENOUS | Status: DC | PRN
  Administered 2019-04-05: 18:00:00 1000 mL via INTRAVENOUS

## 2019-04-05 MED ORDER — LORAZEPAM 2 MG/ML IJ SOLN
2.0000 mg | INTRAMUSCULAR | Status: DC | PRN
  Administered 2019-04-05 – 2019-04-08 (×2): 2 mg via INTRAVENOUS
  Filled 2019-04-05 (×2): qty 1

## 2019-04-05 MED ORDER — SODIUM CHLORIDE (PF) 0.9 % IJ SOLN
INTRAMUSCULAR | Status: AC
  Filled 2019-04-05: qty 50

## 2019-04-05 MED ORDER — POTASSIUM CHLORIDE CRYS ER 20 MEQ PO TBCR
60.0000 meq | EXTENDED_RELEASE_TABLET | Freq: Once | ORAL | Status: AC
  Administered 2019-04-05: 60 meq via ORAL
  Filled 2019-04-05: qty 3

## 2019-04-05 MED ORDER — HEPARIN (PORCINE) 25000 UT/250ML-% IV SOLN
1000.0000 [IU]/h | INTRAVENOUS | Status: DC
  Administered 2019-04-05: 900 [IU]/h via INTRAVENOUS
  Filled 2019-04-05: qty 250

## 2019-04-05 MED ORDER — SODIUM CHLORIDE 0.9 % IV SOLN
1.0000 g | Freq: Three times a day (TID) | INTRAVENOUS | Status: DC
  Administered 2019-04-06: 1 g via INTRAVENOUS
  Filled 2019-04-05 (×2): qty 1

## 2019-04-05 MED ORDER — FOLIC ACID 1 MG PO TABS
1.0000 mg | ORAL_TABLET | Freq: Every day | ORAL | Status: DC
  Administered 2019-04-05 – 2019-04-21 (×15): 1 mg via ORAL
  Filled 2019-04-05 (×17): qty 1

## 2019-04-05 MED ORDER — SODIUM CHLORIDE 0.9 % IV SOLN
1.0000 g | Freq: Once | INTRAVENOUS | Status: AC
  Administered 2019-04-05: 18:00:00 1 g via INTRAVENOUS
  Filled 2019-04-05: qty 1

## 2019-04-05 MED ORDER — THIAMINE HCL 100 MG/ML IJ SOLN
100.0000 mg | Freq: Every day | INTRAMUSCULAR | Status: DC
  Administered 2019-04-05 – 2019-04-11 (×7): 100 mg via INTRAVENOUS
  Filled 2019-04-05 (×7): qty 2

## 2019-04-05 MED ORDER — ACETAMINOPHEN 325 MG PO TABS: 650.0000 mg | ORAL_TABLET | Freq: Once | ORAL | Status: DC

## 2019-04-05 MED ORDER — MORPHINE SULFATE (PF) 4 MG/ML IV SOLN
4.0000 mg | Freq: Once | INTRAVENOUS | Status: AC
  Administered 2019-04-05: 19:00:00 4 mg via INTRAVENOUS
  Filled 2019-04-05: qty 1

## 2019-04-05 MED ORDER — SODIUM CHLORIDE 0.9 % IV SOLN
INTRAVENOUS | Status: DC
  Administered 2019-04-05 – 2019-04-06 (×2): via INTRAVENOUS

## 2019-04-05 MED ORDER — CHLORHEXIDINE GLUCONATE CLOTH 2 % EX PADS
6.0000 | MEDICATED_PAD | Freq: Every day | CUTANEOUS | Status: DC
  Administered 2019-04-05 – 2019-04-16 (×12): 6 via TOPICAL

## 2019-04-05 MED ORDER — ADULT MULTIVITAMIN W/MINERALS CH
1.0000 | ORAL_TABLET | Freq: Every day | ORAL | Status: DC
  Administered 2019-04-05 – 2019-04-20 (×13): 1 via ORAL
  Filled 2019-04-05 (×16): qty 1

## 2019-04-05 MED ORDER — FOLIC ACID 5 MG/ML IJ SOLN: 1.0000 mg | Freq: Every day | INTRAMUSCULAR | Status: DC

## 2019-04-05 MED ORDER — ONDANSETRON HCL 4 MG/2ML IJ SOLN
4.0000 mg | Freq: Four times a day (QID) | INTRAMUSCULAR | Status: DC | PRN
  Administered 2019-04-10 – 2019-04-24 (×10): 4 mg via INTRAVENOUS
  Filled 2019-04-05 (×12): qty 2

## 2019-04-05 MED ORDER — METRONIDAZOLE IN NACL 5-0.79 MG/ML-% IV SOLN
500.0000 mg | Freq: Three times a day (TID) | INTRAVENOUS | Status: DC
  Administered 2019-04-06: 500 mg via INTRAVENOUS
  Filled 2019-04-05: qty 100

## 2019-04-05 MED ORDER — ACETAMINOPHEN 325 MG PO TABS
650.0000 mg | ORAL_TABLET | Freq: Four times a day (QID) | ORAL | Status: DC | PRN
  Administered 2019-04-06 – 2019-04-19 (×13): 650 mg via ORAL
  Filled 2019-04-05 (×14): qty 2

## 2019-04-05 MED ORDER — SODIUM CHLORIDE 0.9 % IV BOLUS (SEPSIS)
1000.0000 mL | Freq: Once | INTRAVENOUS | Status: AC
  Administered 2019-04-05: 1000 mL via INTRAVENOUS

## 2019-04-05 MED ORDER — METRONIDAZOLE IN NACL 5-0.79 MG/ML-% IV SOLN
500.0000 mg | Freq: Once | INTRAVENOUS | Status: AC
  Administered 2019-04-05: 19:00:00 500 mg via INTRAVENOUS
  Filled 2019-04-05: qty 100

## 2019-04-05 MED ORDER — SODIUM CHLORIDE 0.9 % IV BOLUS (SEPSIS)
500.0000 mL | Freq: Once | INTRAVENOUS | Status: AC
  Administered 2019-04-05: 500 mL via INTRAVENOUS

## 2019-04-05 MED ORDER — OXYCODONE-ACETAMINOPHEN 5-325 MG PO TABS: 1.0000 | ORAL_TABLET | Freq: Four times a day (QID) | ORAL | 0 refills | Status: AC | PRN

## 2019-04-05 MED ORDER — METHOCARBAMOL 500 MG PO TABS: 500.0000 mg | ORAL_TABLET | Freq: Four times a day (QID) | ORAL | 0 refills | Status: AC | PRN

## 2019-04-05 MED ORDER — HEPARIN (PORCINE) 25000 UT/250ML-% IV SOLN: 1000.0000 [IU]/h | INTRAVENOUS | Status: DC

## 2019-04-05 NOTE — Telephone Encounter (Signed)
Patient's daughter called requesting an RX refill on her pain medication and the muscle relaxer.  Daughter also stated that she seems to be in more pain and is not able to get her up and around like the patient is suppose to be.  CB#[phone].   Thank you.

## 2019-04-05 NOTE — ED Notes (Signed)
ED TO INPATIENT HANDOFF REPORT  ED Nurse Name and Phone #: [phone]  S Name/Age/Gender April Poissoneborah S Gruetzmacher 73 y.o. female Room/Bed: 1231/1231-01  Code Status   Code Status: Full Code  Home/SNF/Other Home Patient oriented to: self Is this baseline? Yes   Triage Complete: Triage complete  Chief Complaint Hip pain [M25.559] Acute cystitis without hematuria [N30.00] Alcohol-induced acute pancreatitis, unspecified complication status [K85.20] Sepsis, due to unspecified organism, unspecified whether acute organ dysfunction present [facility](HCC) [A41.9]  Triage Note Pt BIBA from home c/o left hip pain.  Hip surgery two weeks ago, unable to ambulate since. Daughter lives with her and has to assist with ADLs.  Daughter reports onset of fever today.    Pt AOx4.   Pt with hx of UTIs, currently with strong, foul smelling odor.    Pt received 600 mL NaCl IV bolus, 1000 mg tylenol PO at 1430 by EMS.       Allergies Allergies  Allergen Reactions  . Naltrexone Shortness Of Breath  . Rocephin [Ceftriaxone Sodium In Dextrose] Anaphylaxis  . Penicillins Rash    Has patient had a PCN reaction causing immediate rash, facial/tongue/throat swelling, SOB or lightheadedness with hypotension: Yes immediate rash  Has patient had a PCN reaction causing severe rash involving mucus membranes or skin necrosis: Yes Has patient had a PCN reaction that required hospitalization": No Has patient had a PCN reaction occurring within the last 10 years: Yes If all of the above answers are "NO", then may proceed with Cephalosporin use.     Level of Care/Admitting Diagnosis ED Disposition    ED Disposition Condition Comment   Admit  [facility] [[id number]]  Level of Care: Stepdown [14]  Admit to SDU based on following criteria: Hemodynamic compromise or significant risk of instability:  Patient requiring short term acute titration and management of vasoactive drips, and invasive  monitoring (i.e., CVP and Arterial line).  Covid Evaluation: N/A  Diagnosis: Sepsis [facility](HCC) [[id number]]) [[id number]]  Admitting Physician: John GiovanniATHORE, VASUNDHRA [[id number]]  Attending Physician: John GiovanniATHORE, VASUNDHRA [[id number]]  Estimated length of stay: past midnight tomorrow  Certification:: I certify this patient will need inpatient services for at least 2 midnights  PT Class (Do Not Modify): Inpatient [101]  PT Acc Code (Do Not Modify): Private [1]       B Medical/Surgery History Past Medical History:  Diagnosis Date  . Anxiety   . Current smoker   . Depression   . ETOH abuse   . GERD (gastroesophageal reflux disease)   . History of shingles summer 2015  . HTN (hypertension)   . Hyperlipidemia   . Hypertension   . Leg edema   . Neuropathy   . Neuropathy   . Pancreatitis    Past Surgical History:  Procedure Laterality Date  . ERCP N/A 02/01/2017   Procedure: ENDOSCOPIC RETROGRADE CHOLANGIOPANCREATOGRAPHY (ERCP);  Surgeon: Dorena CookeyJohn Hayes, MD;  [facility];  Service: Endoscopy;  Laterality: N/A;  . INTRAMEDULLARY (IM) NAIL INTERTROCHANTERIC Left 03/22/2019   Procedure: INTRAMEDULLARY (IM) NAIL INTERTROCHANTRIC;  Surgeon: Kathryne HitchBlackman, Christopher Y, MD;  Location: [facility];  Service: Orthopedics;  Laterality: Left;  . LAPAROSCOPIC CHOLECYSTECTOMY WITH COMMON DUCT EXPLORATION AND INTRAOP CHOLANGIOGRAM N/A 02/03/2017   Procedure: LAPAROSCOPIC CHOLECYSTECTOMY WITH POSSIBLE COMMON DUCT EXPLORATION AND INTRAOP CHOLANGIOGRAM;  Surgeon: Ovidio Kinavid Newman, MD;  Location: [facility];  Service: General;  Laterality: N/A;  . removal of uterine fibroids  2004     A IV Location/Drains/Wounds Patient Lines/Drains/Airways Status   Active Line/Drains/Airways  Name:   Placement date:   Placement time:   Site:   Days:   Peripheral IV Left Antecubital   -    -    Antecubital      Peripheral IV 25-Apr-2019 Right Antecubital   2019-04-25    -    Antecubital   less than 1   External Urinary Catheter   03/21/19    2046    -   15    Incision (Closed) 03/22/19 Hip Left   03/22/19    1548     14          Intake/Output Last 24 hours  Intake/Output Summary (Last 24 hours) at 2019/04/25 2130 Last data filed at 2019/04/25 1612 Gross per 24 hour  Intake 2100 ml  Output -  Net 2100 ml    Labs/Imaging Results for orders placed or performed during the hospital encounter of 04/25/2019 (from the past 48 hour(s))  Magnesium     Status: None   Collection Time: 04/25/2019  3:13 PM  Result Value Ref Range   Magnesium 2.2 1.7 - 2.4 mg/dL    Comment: Performed at [facility], [address], [place], [place] [address]  Lactic acid, plasma     Status: Abnormal   Collection Time: 04/25/2019  3:47 PM  Result Value Ref Range   Lactic Acid, Venous 2.9 (HH) 0.5 - 1.9 mmol/L    Comment: CRITICAL RESULT CALLED TO, READ BACK BY AND VERIFIED WITH: T.SMITH AT 1629 ON 25-Apr-2019 BY N.THOMPSON Performed at [facility], [address] [place] [address], [place], [place] [address]   Comprehensive metabolic panel     Status: Abnormal   Collection Time: 04-25-2019  3:50 PM  Result Value Ref Range   Sodium 132 (L) 135 - 145 mmol/L   Potassium 3.0 (L) 3.5 - 5.1 mmol/L   Chloride 100 98 - 111 mmol/L   CO2 21 (L) 22 - 32 mmol/L   Glucose, Bld 277 (H) 70 - 99 mg/dL   BUN 32 (H) 8 - 23 mg/dL   Creatinine, Ser 0.98 0.44 - 1.00 mg/dL   Calcium 7.8 (L) 8.9 - 10.3 mg/dL   Total Protein 5.3 (L) 6.5 - 8.1 g/dL   Albumin 2.3 (L) 3.5 - 5.0 g/dL   AST 51 (H) 15 - 41 U/L   ALT 37 0 - 44 U/L   Alkaline Phosphatase 215 (H) 38 - 126 U/L   Total Bilirubin 0.5 0.3 - 1.2 mg/dL   GFR calc non Af Amer >60 >60 mL/min   GFR calc Af Amer >60 >60 mL/min   Anion gap 11 5 - 15    Comment: Performed at [facility], [address], [place], [place] [address]  CBC WITH DIFFERENTIAL     Status: Abnormal   Collection Time: 04-25-2019  3:50 PM  Result Value Ref Range   WBC 19.5 (H) 4.0 - 10.5 K/uL   RBC 2.60 (L) 3.87 - 5.11  MIL/uL   Hemoglobin 9.3 (L) 12.0 - 15.0 g/dL   HCT 78.2 (L) 95.6 - 21.3 %   MCV 110.4 (H) 80.0 - 100.0 fL   MCH 35.8 (H) 26.0 - 34.0 pg   MCHC 32.4 30.0 - 36.0 g/dL   RDW 08.6 57.8 - 46.9 %   Platelets 329 150 - 400 K/uL   nRBC 0.0 0.0 - 0.2 %   Neutrophils Relative % 92 %   Neutro Abs 18.0 (H) 1.7 - 7.7 K/uL   Lymphocytes  Relative 3 %   Lymphs Abs 0.5 (L) 0.7 - 4.0 K/uL   Monocytes Relative 3 %   Monocytes Absolute 0.6 0.1 - 1.0 K/uL   Eosinophils Relative 0 %   Eosinophils Absolute 0.0 0.0 - 0.5 K/uL   Basophils Relative 0 %   Basophils Absolute 0.1 0.0 - 0.1 K/uL   WBC Morphology INCREASED BANDS (>20% BANDS)     Comment: VACUOLATED NEUTROPHILS   Immature Granulocytes 2 %   Abs Immature Granulocytes 0.34 (H) 0.00 - 0.07 K/uL   Burr Cells PRESENT    Target Cells PRESENT     Comment: Performed at [facility], [address], [place], [place] [address]  Lipase, blood     Status: Abnormal   Collection Time: 03/24/2019  3:50 PM  Result Value Ref Range   Lipase 82 (H) 11 - 51 U/L    Comment: Performed at [facility], [address] [place], [place] [address]  Brain natriuretic peptide     Status: Abnormal   Collection Time: 04/09/2019  3:52 PM  Result Value Ref Range   B Natriuretic Peptide 119.5 (H) 0.0 - 100.0 pg/mL    Comment: Performed at [facility], [address], [place], [place] [address]  Urinalysis, Routine w reflex microscopic     Status: Abnormal   Collection Time: 04/10/2019  5:27 PM  Result Value Ref Range   Color, Urine AMBER (A) YELLOW    Comment: BIOCHEMICALS MAY BE AFFECTED BY COLOR   APPearance CLOUDY (A) CLEAR   Specific Gravity, Urine 1.017 1.005 - 1.030   pH 7.0 5.0 - 8.0   Glucose, UA NEGATIVE NEGATIVE mg/dL   Hgb urine dipstick MODERATE (A) NEGATIVE   Bilirubin Urine NEGATIVE NEGATIVE   Ketones, ur NEGATIVE NEGATIVE mg/dL   Protein, ur NEGATIVE NEGATIVE mg/dL   Nitrite POSITIVE (A) NEGATIVE    Leukocytes,Ua LARGE (A) NEGATIVE   RBC / HPF 6-10 0 - 5 RBC/hpf   WBC, UA >50 (H) 0 - 5 WBC/hpf   Bacteria, UA MANY (A) NONE SEEN   Squamous Epithelial / LPF 0-5 0 - 5   Mucus PRESENT     Comment: Performed at [facility], [address] [place], [place] [address]  Lactic acid, plasma     Status: None   Collection Time: 03/16/2019  5:42 PM  Result Value Ref Range   Lactic Acid, Venous 1.6 0.5 - 1.9 mmol/L    Comment: Performed at [facility], [address], [place], [place] [address]   Ct Abdomen Pelvis W Contrast  Addendum Date: 04/07/2019   ADDENDUM REPORT: 04/12/2019 20:04 ADDENDUM: Addendum created after discussing role of interventional radiology with Dr. Anitra Lauth. In addition to the previously noted findings, there is also nonocclusive splenic vein/portal vein thrombus at the confluence of splenic vein, superior mesenteric vein, and the portal vein. This is likely a consequence of the pancreatitis/inflammation. These results were discussed by telephone at the time of interpretation on 03/21/2019 at 8:04 pm with Dr. Gwyneth Sprout Electronically Signed   By: Gilmer Mor D.O.   On: 03/21/2019 20:04   Result Date: 04/08/2019 CLINICAL DATA:  Abdominal pain, fever EXAM: CT ABDOMEN AND PELVIS WITH CONTRAST TECHNIQUE: Multidetector CT imaging of the abdomen and pelvis was performed using the standard protocol following bolus administration of intravenous contrast. CONTRAST:  75mL OMNIPAQUE IOHEXOL 300 MG/ML  SOLN COMPARISON:  CT abdomen/pelvis 01/31/2019 FINDINGS: Lower chest: No acute abnormality. Hepatobiliary: No  focal liver abnormality is seen. Status post cholecystectomy. No biliary dilatation. Pancreas: Calcifications in the pancreas consistent with history of chronic pancreatitis. Peripancreatic inflammatory changes around the mid body and tail most concerning for acute pancreatitis. 2.1 cm hypodense fluid collection adjacent to the pancreatic tail  and posterior fundus of the stomach concerning for a pseudocyst. Spleen: Choose Adrenals/Urinary Tract: Adrenal glands are unremarkable. Kidneys are normal, without renal calculi, focal lesion, or hydronephrosis. Bladder is unremarkable. Stomach/Bowel: Stomach is within normal limits. Appendix appears normal. No evidence of bowel wall thickening, distention, or inflammatory changes. Diverticulosis without evidence of diverticulitis. Vascular/Lymphatic: Abdominal aortic atherosclerosis. No lymphadenopathy. Reproductive: Uterine fibroid.  No other adnexal mass. Other: No abdominal wall hernia or abnormality. No abdominopelvic ascites. Musculoskeletal: No acute osseous abnormality. No aggressive osseous lesion. Degenerative disc disease with disc height loss at L5-S1 with bilateral facet arthropathy. IMPRESSION: 1. Acute pancreatitis involving the body and tail the pancreas. 2.1 cm hypodense fluid collection adjacent to the pancreatic tail and posterior fundus of the stomach concerning for a pseudocyst which appears to be within the posterior wall of the stomach. 2. Diverticulosis without evidence of diverticulitis. 3.  Aortic Atherosclerosis (ICD10-I70.0). Electronically Signed: By: Elige Ko On: 03/17/2019 17:33   Dg Chest Port 1 View  Result Date: 04/12/2019 CLINICAL DATA:  73 year old female with a history of left hip pain EXAM: PORTABLE CHEST 1 VIEW COMPARISON:  03/21/2019, 07/24/2018 FINDINGS: Cardiomediastinal silhouette unchanged in size and contour. No evidence of central vascular congestion. No interlobular septal thickening. No pneumothorax. No pleural effusion. Coarsened interstitial markings are similar to the comparison. No confluent airspace disease. No displaced fracture. IMPRESSION: Chronic changes without evidence of acute cardiopulmonary disease Electronically Signed   By: Gilmer Mor D.O.   On: 03/25/2019 15:29   Dg Hip Unilat With Pelvis 2-3 Views Right  Result Date:  04/08/2019 CLINICAL DATA:  73 year old female with pain. Prior left hip fracture fixation 03/22/2019 EXAM: DG HIP (WITH OR WITHOUT PELVIS) 2-3V RIGHT COMPARISON:  CT today FINDINGS: Retained contrast within the urinary bladder, distended. Formed stool within the rectum. Purewick urine collection system. Bony pelvic ring appears intact with no acute displaced fracture. Right hip projects normally over the acetabula with no proximal right femoral fracture identified. Surgical changes of left hip repair with no significant callus formation at the site of recent hip fracture. Vascular calcifications. IMPRESSION: Negative for acute bony abnormality. Urinary bladder with excreted contrast, partially distended. Formed stool within the rectal vault. Atherosclerosis. Electronically Signed   By: Gilmer Mor D.O.   On: 03/27/2019 21:15    Pending Labs Unresulted Labs (From admission, onward)    Start     Ordered   04/06/19 0500  Heparin level (unfractionated)  Tomorrow morning,   R     03/27/2019 2011   04/06/19 0500  CBC  Daily,   R     04/04/2019 2011   04/06/19 0500  Hepatic function panel  Tomorrow morning,   R     04/10/2019 2032   04/06/19 0500  Hemoglobin A1c  Tomorrow morning,   R     03/24/2019 2034   04/06/19 0500  Vitamin B12  (Anemia Panel (PNL))  Tomorrow morning,   R     03/31/2019 2034   04/06/19 0500  Folate  (Anemia Panel (PNL))  Tomorrow morning,   R     03/19/2019 2034   04/06/19 0500  Iron and TIBC  (Anemia Panel (PNL))  Tomorrow morning,   R  04/09/2019 2034   04/06/19 0500  Ferritin  (Anemia Panel (PNL))  Tomorrow morning,   R     04/12/2019 2034   04/06/19 0500  Reticulocytes  (Anemia Panel (PNL))  Tomorrow morning,   R     03/20/2019 2034   03/26/2019 1511  Urine culture  ONCE - STAT,   STAT    Question:  Patient immune status  Answer:  Normal   04/08/2019 1512   03/18/2019 1510  Blood Culture (routine x 2)  BLOOD CULTURE X 2,   STAT    Question:  Patient immune status  Answer:  Normal    04/13/2019 1512          Vitals/Pain Today's Vitals   04/08/2019 1930 03/28/2019 1945 03/15/2019 2000 04/09/2019 2100  BP: (!) 91/56 94/64 97/69  (!) 97/59  Pulse: 93 79 89 94  Resp: 16 19 20  (!) 23  Temp:      TempSrc:      SpO2: 93% 95% 93% 94%  Weight:      Height:      PainSc:        Isolation Precautions No active isolations  Medications Medications  sodium chloride (PF) 0.9 % injection (0 mLs  Hold 03/22/2019 1733)  0.9 %  sodium chloride infusion (1,000 mLs Intravenous New Bag/Given 03/19/2019 1821)  heparin ADULT infusion 100 units/mL (25000 units/234mL sodium chloride 0.45%) (900 Units/hr Intravenous Transfusing/Transfer 03/31/2019 2129)  metroNIDAZOLE (FLAGYL) IVPB 500 mg (has no administration in time range)  acetaminophen (TYLENOL) tablet 650 mg (has no administration in time range)    Or  acetaminophen (TYLENOL) suppository 650 mg (has no administration in time range)  ondansetron (ZOFRAN) injection 4 mg (has no administration in time range)  morphine 2 MG/ML injection 1 mg (has no administration in time range)  potassium chloride SA (K-DUR) CR tablet 60 mEq (has no administration in time range)  LORazepam (ATIVAN) injection 2-3 mg (has no administration in time range)  multivitamin with minerals tablet 1 tablet (has no administration in time range)  thiamine (B-1) injection 100 mg (has no administration in time range)  0.9 %  sodium chloride infusion ( Intravenous Transfusing/Transfer 03/19/2019 2130)  nicotine (NICODERM CQ - dosed in mg/24 hours) patch 21 mg (has no administration in time range)  aztreonam (AZACTAM) 1 g in sodium chloride 0.9 % 100 mL IVPB (has no administration in time range)  ciprofloxacin (CIPRO) IVPB 400 mg (has no administration in time range)  folic acid (FOLVITE) tablet 1 mg (has no administration in time range)  sodium chloride 0.9 % bolus 1,000 mL (0 mLs Intravenous Stopped 03/17/2019 1612)    And  sodium chloride 0.9 % bolus 500 mL (0 mLs Intravenous  Stopped 03/25/2019 1605)  iohexol (OMNIPAQUE) 300 MG/ML solution 100 mL (75 mLs Intravenous Contrast Given 03/31/2019 1703)  morphine 4 MG/ML injection 4 mg (4 mg Intravenous Given 04/09/2019 1741)  ondansetron (ZOFRAN) injection 4 mg (4 mg Intravenous Given 03/21/2019 1740)  aztreonam (AZACTAM) 1 g in sodium chloride 0.9 % 100 mL IVPB (0 g Intravenous Stopped 03/24/2019 1936)  ciprofloxacin (CIPRO) IVPB 400 mg (0 mg Intravenous Stopped 03/23/2019 2129)  metroNIDAZOLE (FLAGYL) IVPB 500 mg (0 mg Intravenous Stopped 04/02/2019 2037)  morphine 4 MG/ML injection 4 mg (4 mg Intravenous Given 04/09/2019 1925)  heparin bolus via infusion 1,500 Units (1,500 Units Intravenous Bolus from Bag 03/16/2019 2038)    Mobility non-ambulatory High fall risk   Focused Assessments Cardiac Assessment Handoff:  Cardiac Rhythm: Sinus tachycardia  Lab Results  Component Value Date   CKTOTAL 16 (L) 05/17/2018   Lab Results  Component Value Date   DDIMER 1.29 (H) 04/30/2018   Does the Patient currently have chest pain? No     R Recommendations: See Admitting Provider Note  Report given to:   Additional Notes:

## 2019-04-05 NOTE — Progress Notes (Signed)
ANTICOAGULATION CONSULT NOTE - Initial Consult  Pharmacy Consult for IV UFH Indication: portal vein thrombosis  Allergies  Allergen Reactions  . Naltrexone Shortness Of Breath  . Rocephin [Ceftriaxone Sodium In Dextrose] Anaphylaxis  . Penicillins Rash    Has patient had a PCN reaction causing immediate rash, facial/tongue/throat swelling, SOB or lightheadedness with hypotension: Yes immediate rash  Has patient had a PCN reaction causing severe rash involving mucus membranes or skin necrosis: Yes Has patient had a PCN reaction that required hospitalization": No Has patient had a PCN reaction occurring within the last 10 years: Yes If all of the above answers are "NO", then may proceed with Cephalosporin use.     Patient Measurements: Height: 5\' 6"  (167.6 cm) Weight: 110 lb (49.9 kg) IBW/kg (Calculated) : 59.3 Heparin Dosing Weight: 50  Vital Signs: Temp: 98 F (36.7 C) (04/22 1707) Temp Source: Oral (04/22 1707) BP: 94/64 (04/22 1945) Pulse Rate: 79 (04/22 1945)  Labs: Recent Labs    04/09/2019 1550  HGB 9.3*  HCT 28.7*  PLT 329  CREATININE 0.63    Estimated Creatinine Clearance: 50.8 mL/min (by C-G formula based on SCr of 0.63 mg/dL).   Medical History: Past Medical History:  Diagnosis Date  . Anxiety   . Current smoker   . Depression   . ETOH abuse   . GERD (gastroesophageal reflux disease)   . History of shingles summer 2015  . HTN (hypertension)   . Hyperlipidemia   . Hypertension   . Leg edema   . Neuropathy   . Neuropathy   . Pancreatitis     Assessment: 73 yo with portal vein thrombosis to start IV heparin per Rx protocol. Baseline labs drawn  Goal of Therapy:  HL 0.3-0.7   Plan:  1) 1500 unit IV heparin bolus then 2) 900 units/hr IV heparin infusion rate 3) Check heparin level 8 hours after heparin started 4) Daily CBC  Hessie Knows, PharmD, BCPS 03/20/2019 8:08 PM

## 2019-04-05 NOTE — ED Provider Notes (Addendum)
[facility]-EMERGENCY DEPT Provider Note   CSN: [id number] Arrival date & time: 04/07/2019  1458    History   Chief Complaint Chief Complaint  Patient presents with  . Hip Pain  . Urinary Tract Infection    HPI April [name] is a 73 y.o. female.     Patient is a 73 year old female with a significant past medical history of alcohol abuse, hypertension, hyperlipidemia with recent admission to the hospital after a fall resulting in a hip fracture.  Patient has been home status post surgery about 1-1/2 weeks with her daughter.  Daughter states over the last few days she had been complaining that her right side hurt but today was the worst with worsening pain in her right side and abdomen, fever, mild confusion and severe anorexia.  Daughter states that since being home from the hospital she has not felt great and has never been a big eater but today was much worse.  Unclear when patient's last bowel movement was.  During her last hospitalization she did have severe electrolyte abnormalities which was thought to be related to alcohol abuse.  Daughter states she is still drinking at home.  She typically drinks brandy and drinks approximately 1-3 shot glasses per day.  In the past she has had alcohol withdrawal.  The history is provided by the patient and the EMS personnel.  Urinary Tract Infection  Associated symptoms: fever   Associated symptoms: no nausea and no vomiting   Fever  Temp source:  Subjective Severity:  Severe Onset quality:  Gradual Duration: over the last few days she had not felt well but the worst today. Timing:  Constant Progression:  Worsening Chronicity:  New Relieved by:  None tried Worsened by:  Nothing Ineffective treatments:  None tried Associated symptoms: confusion   Associated symptoms: no cough, no diarrhea, no nausea, no rhinorrhea and no vomiting   Associated symptoms comment:  Abdominal and right side pain.  Denies dysuria.  No  chest pain, SOB or cough.  NO rashes and swelling in lower legs is unchanged. Risk factors comment:  Recent hip surgery 2 weeks ago   Past Medical History:  Diagnosis Date  . Anxiety   . Current smoker   . Depression   . ETOH abuse   . GERD (gastroesophageal reflux disease)   . History of shingles summer 2015  . HTN (hypertension)   . Hyperlipidemia   . Hypertension   . Leg edema   . Neuropathy   . Neuropathy   . Pancreatitis     Patient Active Problem List   Diagnosis Date Noted  . Hip fracture (HCC) 03/21/2019  . Displaced intertrochanteric fracture of left femur, initial encounter for closed fracture (HCC)   . Anaphylactic reaction 07/24/2018  . FTT (failure to thrive) in adult 05/18/2018  . Falls 05/18/2018  . Pancreatitis 10/05/2017  . Frequent falls 02/11/2017  . Protein-calorie malnutrition, severe 02/05/2017  . Gallstone pancreatitis 01/30/2017  . Hypokalemia 01/30/2017  . Macrocytic anemia 01/30/2017  . Metabolic acidosis 01/30/2017  . Neuropathy, alcoholic (HCC) 09/08/2016  . Neuropathy 12/11/2015  . Alcohol abuse 12/11/2015  . Tobacco abuse 12/11/2015  . Chronic venous insufficiency 09/13/2015  . Extremity numbness 09/13/2015    Past Surgical History:  Procedure Laterality Date  . ERCP N/A 02/01/2017   Procedure: ENDOSCOPIC RETROGRADE CHOLANGIOPANCREATOGRAPHY (ERCP);  Surgeon: Dorena CookeyJohn Hayes, MD;  Location: [facility];  Service: Endoscopy;  Laterality: N/A;  . INTRAMEDULLARY (IM) NAIL INTERTROCHANTERIC Left 03/22/2019   Procedure:  INTRAMEDULLARY (IM) NAIL INTERTROCHANTRIC;  Surgeon: Kathryne Hitch, MD;  Location: [facility];  Service: Orthopedics;  Laterality: Left;  . LAPAROSCOPIC CHOLECYSTECTOMY WITH COMMON DUCT EXPLORATION AND INTRAOP CHOLANGIOGRAM N/A 02/03/2017   Procedure: LAPAROSCOPIC CHOLECYSTECTOMY WITH POSSIBLE COMMON DUCT EXPLORATION AND INTRAOP CHOLANGIOGRAM;  Surgeon: Ovidio Kin, MD;  Location: [facility];  Service: General;  Laterality: N/A;  .  removal of uterine fibroids  2004     OB History   No obstetric history on file.      Home Medications    Prior to Admission medications   Medication Sig Start Date End Date Taking? Authorizing Provider  aspirin EC 325 MG EC tablet Take 1 tablet (325 mg total) by mouth daily with breakfast. 03/24/19   Pokhrel, Laxman, MD  calcium-vitamin D (OSCAL WITH D) 500-200 MG-UNIT tablet Take 1 tablet by mouth 2 (two) times daily. 03/24/19   Pokhrel, Rebekah Chesterfield, MD  clonazePAM (KLONOPIN) 0.5 MG tablet Take 0.5 mg by mouth 2 (two) times daily as needed for anxiety. as directed 08/05/17   [provider]  feeding supplement, ENSURE ENLIVE, (ENSURE ENLIVE) LIQD Take 237 mLs by mouth 2 (two) times daily between meals. Patient not taking: Reported on 03/21/2019 07/26/18   Narda Bonds, MD  folic acid (FOLVITE) 1 MG tablet Take 1 tablet (1 mg total) by mouth daily. 03/24/19   Pokhrel, Rebekah Chesterfield, MD  magnesium oxide (MAG-OX) 400 (241.3 Mg) MG tablet Take 1 tablet (400 mg total) by mouth 2 (two) times daily. 03/24/19   Pokhrel, Rebekah Chesterfield, MD  methocarbamol (ROBAXIN) 500 MG tablet Take 1 tablet (500 mg total) by mouth every 6 (six) hours as needed for muscle spasms. 04-21-2019   Kathryne Hitch, MD  Multiple Vitamin (MULTIVITAMIN WITH MINERALS) TABS tablet Take 1 tablet by mouth daily. Patient not taking: Reported on 03/21/2019 04/13/17   Penny Pia, MD  ondansetron (ZOFRAN) 4 MG tablet Take 1 tablet (4 mg total) by mouth every 6 (six) hours as needed for nausea. 03/24/19   Pokhrel, Rebekah Chesterfield, MD  oxyCODONE-acetaminophen (PERCOCET/ROXICET) 5-325 MG tablet Take 1 tablet by mouth every 6 (six) hours as needed for severe pain. 2019/04/21   Kathryne Hitch, MD  pantoprazole (PROTONIX) 40 MG tablet Take 1 tablet (40 mg total) by mouth daily for 15 days. 03/24/19 04/08/19  Pokhrel, Rebekah Chesterfield, MD  polyethylene glycol (MIRALAX / GLYCOLAX) packet Take 17 g by mouth daily. Patient not taking: Reported on 03/21/2019 07/27/18    Narda Bonds, MD  potassium chloride (K-DUR,KLOR-CON) 10 MEQ tablet Take 10 mEq by mouth 2 (two) times daily.    [provider]  thiamine 100 MG tablet Take 1 tablet (100 mg total) by mouth daily. 03/24/19   Pokhrel, Rebekah Chesterfield, MD    Family History Family History  Problem Relation Age of Onset  . Heart disease Father   . Neuropathy Neg Hx     Social History Social History   Tobacco Use  . Smoking status: Current Every Day Smoker    Packs/day: 1.00    Types: Cigarettes  . Smokeless tobacco: Never Used  Substance Use Topics  . Alcohol use: Yes    Alcohol/week: 14.0 standard drinks    Types: 14 Shots of liquor per week    Comment: states., "I keep a glass with me", reports drinking today (04/09/17)  . Drug use: No     Allergies   Naltrexone; Rocephin [ceftriaxone sodium in dextrose]; and Penicillins   Review of Systems Review of Systems  Constitutional: Positive  for fever.  HENT: Negative for rhinorrhea.   Respiratory: Negative for cough.   Gastrointestinal: Negative for diarrhea, nausea and vomiting.  Psychiatric/Behavioral: Positive for confusion.  All other systems reviewed and are negative.    Physical Exam Updated Vital Signs BP (!) 94/57   Pulse 84   Temp 98 F (36.7 C) (Oral)   Resp 20   Ht  (1.676 m)   Wt 49.9 kg   SpO2 96%   BMI 17.75 kg/m   Physical Exam Vitals signs and nursing note reviewed.  Constitutional:      General: She is not in acute distress.    Appearance: She is well-developed. She is cachectic.  HENT:     Head: Normocephalic and atraumatic.     Mouth/Throat:     Mouth: Mucous membranes are dry.  Eyes:     Conjunctiva/sclera: Conjunctivae normal.     Pupils: Pupils are equal, round, and reactive to light.  Neck:     Musculoskeletal: Normal range of motion and neck supple.  Cardiovascular:     Rate and Rhythm: Regular rhythm. Tachycardia present.     Heart sounds: No murmur.  Pulmonary:     Effort: Pulmonary  effort is normal. No respiratory distress.     Breath sounds: Normal breath sounds. No wheezing or rales.  Abdominal:     General: There is no distension.     Palpations: Abdomen is soft.     Tenderness: There is abdominal tenderness in the right lower quadrant and suprapubic area. There is guarding. There is no rebound.  Musculoskeletal: Normal range of motion.        General: Tenderness present.     Right lower leg: Edema present.     Left lower leg: Edema present.     Comments: Left hip scar with staples intact.  Incision is clean dry and intact without any purulent drainage or erythema.  Skin:    General: Skin is warm and dry.     Findings: No erythema or rash.  Neurological:     General: No focal deficit present.     Mental Status: She is alert. Mental status is at baseline.     Comments: Oriented to person and place but not sure of date  Psychiatric:     Comments: Mild confusion but calm and cooperative.      ED Treatments / Results  Labs (all labs ordered are listed, but only abnormal results are displayed) Labs Reviewed  LACTIC ACID, PLASMA - Abnormal; Notable for the following components:      Result Value   Lactic Acid, Venous 2.9 (*)    All other components within normal limits  COMPREHENSIVE METABOLIC PANEL - Abnormal; Notable for the following components:   Sodium 132 (*)    Potassium 3.0 (*)    CO2 21 (*)    Glucose, Bld 277 (*)    BUN 32 (*)    Calcium 7.8 (*)    Total Protein 5.3 (*)    Albumin 2.3 (*)    AST 51 (*)    Alkaline Phosphatase 215 (*)    All other components within normal limits  CBC WITH DIFFERENTIAL/PLATELET - Abnormal; Notable for the following components:   WBC 19.5 (*)    RBC 2.60 (*)    Hemoglobin 9.3 (*)    HCT 28.7 (*)    MCV 110.4 (*)    MCH 35.8 (*)    Neutro Abs 18.0 (*)    Lymphs Abs 0.5 (*)  Abs Immature Granulocytes 0.34 (*)    All other components within normal limits  URINALYSIS, ROUTINE W REFLEX MICROSCOPIC -  Abnormal; Notable for the following components:   Color, Urine AMBER (*)    APPearance CLOUDY (*)    Hgb urine dipstick MODERATE (*)    Nitrite POSITIVE (*)    Leukocytes,Ua LARGE (*)    WBC, UA >50 (*)    Bacteria, UA MANY (*)    All other components within normal limits  LIPASE, BLOOD - Abnormal; Notable for the following components:   Lipase 82 (*)    All other components within normal limits  BRAIN NATRIURETIC PEPTIDE - Abnormal; Notable for the following components:   B Natriuretic Peptide 119.5 (*)    All other components within normal limits  CULTURE, BLOOD (ROUTINE X 2)  CULTURE, BLOOD (ROUTINE X 2)  URINE CULTURE  LACTIC ACID, PLASMA    EKG EKG Interpretation  Date/Time:  Wednesday April 05 2019 15:18:26 EDT Ventricular Rate:  115 PR Interval:    QRS Duration: 89 QT Interval:  332 QTC Calculation: 454 R Axis:   -73 Text Interpretation:  Sinus tachycardia Multiple premature complexes, vent & supraven Left anterior fascicular block Low voltage, precordial leads ST elevation suggests acute pericarditis No significant change since last tracing Confirmed by Gwyneth Sprout ([id number]) on 04/06/2019 4:27:38 PM   Radiology Ct Abdomen Pelvis W Contrast  Result Date: 03/18/2019 CLINICAL DATA:  Abdominal pain, fever EXAM: CT ABDOMEN AND PELVIS WITH CONTRAST TECHNIQUE: Multidetector CT imaging of the abdomen and pelvis was performed using the standard protocol following bolus administration of intravenous contrast. CONTRAST:  75mL OMNIPAQUE IOHEXOL 300 MG/ML  SOLN COMPARISON:  CT abdomen/pelvis 01/31/2019 FINDINGS: Lower chest: No acute abnormality. Hepatobiliary: No focal liver abnormality is seen. Status post cholecystectomy. No biliary dilatation. Pancreas: Calcifications in the pancreas consistent with history of chronic pancreatitis. Peripancreatic inflammatory changes around the mid body and tail most concerning for acute pancreatitis. 2.1 cm hypodense fluid collection adjacent  to the pancreatic tail and posterior fundus of the stomach concerning for a pseudocyst. Spleen: Choose Adrenals/Urinary Tract: Adrenal glands are unremarkable. Kidneys are normal, without renal calculi, focal lesion, or hydronephrosis. Bladder is unremarkable. Stomach/Bowel: Stomach is within normal limits. Appendix appears normal. No evidence of bowel wall thickening, distention, or inflammatory changes. Diverticulosis without evidence of diverticulitis. Vascular/Lymphatic: Abdominal aortic atherosclerosis. No lymphadenopathy. Reproductive: Uterine fibroid.  No other adnexal mass. Other: No abdominal wall hernia or abnormality. No abdominopelvic ascites. Musculoskeletal: No acute osseous abnormality. No aggressive osseous lesion. Degenerative disc disease with disc height loss at L5-S1 with bilateral facet arthropathy. IMPRESSION: 1. Acute pancreatitis involving the body and tail the pancreas. 2.1 cm hypodense fluid collection adjacent to the pancreatic tail and posterior fundus of the stomach concerning for a pseudocyst which appears to be within the posterior wall of the stomach. 2. Diverticulosis without evidence of diverticulitis. 3.  Aortic Atherosclerosis (ICD10-I70.0). Electronically Signed   By: Elige Ko   On: 04/03/2019 17:33   Dg Chest Port 1 View  Result Date: 03/27/2019 CLINICAL DATA:  73 year old female with a history of left hip pain EXAM: PORTABLE CHEST 1 VIEW COMPARISON:  03/21/2019, 07/24/2018 FINDINGS: Cardiomediastinal silhouette unchanged in size and contour. No evidence of central vascular congestion. No interlobular septal thickening. No pneumothorax. No pleural effusion. Coarsened interstitial markings are similar to the comparison. No confluent airspace disease. No displaced fracture. IMPRESSION: Chronic changes without evidence of acute cardiopulmonary disease Electronically Signed   By: Gilmer Mor  D.O.   On: 04/04/2019 15:29    Procedures .Suture Removal Date/Time:  03/15/2019 7:09 PM Performed by: Gwyneth Sprout, MD Authorized by: Gwyneth Sprout, MD   Consent:    Consent obtained:  Verbal   Consent given by:  Patient   Risks discussed:  Wound separation   Alternatives discussed:  No treatment Location:    Location:  Lower extremity   Lower extremity location:  Leg   Leg location:  L upper leg Procedure details:    Wound appearance:  No signs of infection, good wound healing and clean   Number of staples removed:  25 Post-procedure details:    Post-removal:  Dressing applied   Patient tolerance of procedure:  Tolerated well, no immediate complications   (including critical care time)  Medications Ordered in ED Medications  sodium chloride 0.9 % bolus 1,000 mL (has no administration in time range)    And  sodium chloride 0.9 % bolus 500 mL (has no administration in time range)     Initial Impression / Assessment and Plan / ED Course  I have reviewed the triage vital signs and the nursing notes.  Pertinent labs & imaging results that were available during my care of the patient were reviewed by me and considered in my medical decision making (see chart for details).        Elderly female presenting today with symptoms of code sepsis.  She is febrile, tachycardic and having lower abdominal pain.  Patient does have a history of alcohol abuse and falls with recent hip replacement.  Patient denies any pain over the hip and the incision is healing well with low suspicion that that is her source.  She has no evidence of cellulitis.  She denies any upper respiratory symptoms and this is confirmed by her daughter.  Lower suspicion for COVID at this time.  She denies any chest pain, shortness of breath.  She has no upper abdominal pain on exam.  Concern for acute intra-abdominal pathology versus UTI.  Patient does smell strongly of urine.  sHe was started on sepsis protocol and given IV fluids.  She received 30 mL's per kilo which was 1.5 L.   She was also given Tylenol by EMS for her temperature of 101.9.  5:42 PM Patient's CMP has mild hypokalemia today of 3.0, calcium is 7.8 which is not much different than baseline and elevated BUN.  Patient's lipase is elevated at 82 and lactate is elevated at 2.9.  Leukocytosis of 19.5 with stable hemoglobin.  She does have a left shift greater than 20% bands.  Chest x-ray without acute findings.  CT of the abdomen pelvis with acute pancreatitis and possible pseudocyst but no evidence of appendicitis.  UA is still pending.  Patient covered for intra-abdominal pathology as well as a UTI.  She does have an anaphylactic reaction to Rocephin so she was given aztreonam, Cipro and Flagyl.  Patient's blood pressure improved after her 30/kg bolus.  Patient is now stating she wants to leave however do not feel that this will be the right thing for the patient and neither does her daughter.  Patient was given some pain medication.  7:08 PM Repeat lactate is within normal limits.  Patient's UA is also consistent with a UTI with positive nitrites, leukocytes and greater than 50 white blood cells.  Urine culture is pending.  Patient has already been covered.  Repeat lactate has improved.  Patient was also supposed to see Dr. Magnus Ivan tomorrow to have her  staples removed so her surgical staples were removed from her left hip.  The wound appears to be healing well.  7:44 PM With Dr. Shella Spearing with interventional radiology and he said at this time there is no indication to intervene tonight.  Based on where patient's pseudocyst is at this time he states the patient is not an IR candidate.  Feels that GI needs to be involved but at this time it most likely just needs to be watched to ensure it does not get bigger.  7:54 PM Dr. Fransisco Beau just called back and reviewing pt's CT concern for a portal vein thrombosis near the pancreas which may also be causing the pancreatitis and could be septic.  Will Discuss with hospitalist  and pt started on heparin.   April [name] was evaluated in Emergency Department on 04-24-2019 for the symptoms described in the history of present illness. She was evaluated in the context of the global COVID-19 pandemic, which necessitated consideration that the patient might be at risk for infection with the SARS-CoV-2 virus that causes COVID-19. Institutional protocols and algorithms that pertain to the evaluation of patients at risk for COVID-19 are in a state of rapid change based on information released by regulatory bodies including the CDC and federal and state organizations. These policies and algorithms were followed during the patient's care in the ED.  CRITICAL CARE Performed by: Loi Rennaker Total critical care time: 30 minutes Critical care time was exclusive of separately billable procedures and treating other patients. Critical care was necessary to treat or prevent imminent or life-threatening deterioration. Critical care was time spent personally by me on the following activities: development of treatment plan with patient and/or surrogate as well as nursing, discussions with consultants, evaluation of patient's response to treatment, examination of patient, obtaining history from patient or surrogate, ordering and performing treatments and interventions, ordering and review of laboratory studies, ordering and review of radiographic studies, pulse oximetry and re-evaluation of patient's condition.  April [name] was evaluated in Emergency Department on 04/24/2019 for the symptoms described in the history of present illness. She was evaluated in the context of the global COVID-19 pandemic, which necessitated consideration that the patient might be at risk for infection with the SARS-CoV-2 virus that causes COVID-19. Institutional protocols and algorithms that pertain to the evaluation of patients at risk for COVID-19 are in a state of rapid change based on information released  by regulatory bodies including the CDC and federal and state organizations. These policies and algorithms were followed during the patient's care in the ED.   Final Clinical Impressions(s) / ED Diagnoses   Final diagnoses:  Acute cystitis without hematuria  Sepsis, due to unspecified organism, unspecified whether acute organ dysfunction present [facility])  Alcohol-induced acute pancreatitis, unspecified complication status    ED Discharge Orders    None       Gwyneth Sprout, MD 04/24/2019 1911    Gwyneth Sprout, MD Apr 24, 2019 1911    Gwyneth Sprout, MD 2019-04-24 1944    Gwyneth Sprout, MD 04-24-2019 [phone]

## 2019-04-05 NOTE — ED Notes (Signed)
Patient transported to CT 

## 2019-04-05 NOTE — Telephone Encounter (Signed)
I sent in some more of her pain medication and muscle relaxer.

## 2019-04-05 NOTE — Telephone Encounter (Signed)
Please advise 

## 2019-04-05 NOTE — Telephone Encounter (Signed)
Patient aware Rx sent in  

## 2019-04-05 NOTE — ED Notes (Signed)
Bed: JW92 Expected date:  Expected time:  Means of arrival:  Comments: EMS/post-op fever

## 2019-04-05 NOTE — Telephone Encounter (Signed)
Patient's daughter would like for you to call her when the RX has been sent into the pharmacy.  Thank you.

## 2019-04-05 NOTE — H&P (Signed)
History and Physical    BERRY GALLACHER KGU:[id number] DOB: 1946-03-07 DOA: 03/25/2019  PCP: Aletha Halim., PA-C Patient coming from: Home  Chief Complaint: Hip pain, UTI symptoms  HPI: April [name] is a 73 y.o. female with medical history significant of anxiety, tobacco use, depression, alcohol abuse, GERD, hypertension, hyperlipidemia, pancreatitis, recent left intertrochanteric femur fracture status post intervention with intramedullary nail placement on 03/22/2019 presenting to the hospital for evaluation of hip pain and UTI symptoms. History provided by patient very limited.  Reports generalized weakness, dysuria, urinary frequency, and urgency.  Denies any flank pain.  She is not able to give me a timeline of when the symptoms started.  Also reports suprapubic abdominal pain and right-sided hip pain.  Not complaining of any left hip pain.  Denies any cough or shortness of breath.  Denies any fevers, chills, sick contacts, recent travel, or exposure to any individual with confirmed COVID-19.  States she drinks 3 shot glasses of whiskey every day.  She is not able to tell me when her last drink was.  Also reports smoking 1 pack of cigarettes every day.  History per ED provider conversation with the patient's daughter: "Daughter states over the last few days she had been complaining that her right side hurt but today was the worst with worsening pain in her right side and abdomen, fever, mild confusion and severe anorexia.  Daughter states that since being home from the hospital she has not felt great and has never been a big eater but today was much worse.  Unclear when patient's last bowel movement was.  During her last hospitalization she did have severe electrolyte abnormalities which was thought to be related to alcohol abuse.  Daughter states she is still drinking at home.  She typically drinks brandy and drinks approximately 1-3 shot glasses per day.  In the past she has had alcohol  withdrawal."  Review of Systems: As per HPI otherwise 10 point review of systems negative.  Past Medical History:  Diagnosis Date   Anxiety    Current smoker    Depression    ETOH abuse    GERD (gastroesophageal reflux disease)    History of shingles summer 2015   HTN (hypertension)    Hyperlipidemia    Hypertension    Leg edema    Neuropathy    Neuropathy    Pancreatitis     Past Surgical History:  Procedure Laterality Date   ERCP N/A 02/01/2017   Procedure: ENDOSCOPIC RETROGRADE CHOLANGIOPANCREATOGRAPHY (ERCP);  Surgeon: Teena Irani, MD;  Location: [facility];  Service: Endoscopy;  Laterality: N/A;   INTRAMEDULLARY (IM) NAIL INTERTROCHANTERIC Left 03/22/2019   Procedure: INTRAMEDULLARY (IM) NAIL INTERTROCHANTRIC;  Surgeon: Mcarthur Rossetti, MD;  Location: [facility];  Service: Orthopedics;  Laterality: Left;   LAPAROSCOPIC CHOLECYSTECTOMY WITH COMMON DUCT EXPLORATION AND INTRAOP CHOLANGIOGRAM N/A 02/03/2017   Procedure: LAPAROSCOPIC CHOLECYSTECTOMY WITH POSSIBLE COMMON DUCT EXPLORATION AND INTRAOP CHOLANGIOGRAM;  Surgeon: Alphonsa Overall, MD;  Location: [facility];  Service: General;  Laterality: N/A;   removal of uterine fibroids  2004     reports that she has been smoking cigarettes. She has been smoking about 1.00 pack per day. She has never used smokeless tobacco. She reports current alcohol use of about 14.0 standard drinks of alcohol per week. She reports that she does not use drugs.  Allergies  Allergen Reactions   Naltrexone Shortness Of Breath   Rocephin [Ceftriaxone Sodium In Dextrose] Anaphylaxis   Penicillins Rash  Has patient had a PCN reaction causing immediate rash, facial/tongue/throat swelling, SOB or lightheadedness with hypotension: Yes immediate rash  Has patient had a PCN reaction causing severe rash involving mucus membranes or skin necrosis: Yes Has patient had a PCN reaction that required hospitalization": No Has patient had a PCN  reaction occurring within the last 10 years: Yes If all of the above answers are "NO", then may proceed with Cephalosporin use.     Family History  Problem Relation Age of Onset   Heart disease Father    Neuropathy Neg Hx     Prior to Admission medications   Medication Sig Start Date End Date Taking? Authorizing Provider  amitriptyline (ELAVIL) 25 MG tablet TAKE 1 TABLET (25 MG TOTAL) BY MOUTH NIGHTLY AS NEEDED FOR SLEEP. 03/23/19   [provider]  aspirin EC 325 MG EC tablet Take 1 tablet (325 mg total) by mouth daily with breakfast. 03/24/19   Pokhrel, Laxman, MD  calcium-vitamin D (OSCAL WITH D) 500-200 MG-UNIT tablet Take 1 tablet by mouth 2 (two) times daily. 03/24/19   Pokhrel, Corrie Mckusick, MD  clonazePAM (KLONOPIN) 0.5 MG tablet Take 0.5 mg by mouth 2 (two) times daily as needed for anxiety. as directed 08/05/17   [provider]  feeding supplement, ENSURE ENLIVE, (ENSURE ENLIVE) LIQD Take 237 mLs by mouth 2 (two) times daily between meals. Patient not taking: Reported on 03/21/2019 07/26/18   Mariel Aloe, MD  folic acid (FOLVITE) 1 MG tablet Take 1 tablet (1 mg total) by mouth daily. 03/24/19   Pokhrel, Corrie Mckusick, MD  magnesium oxide (MAG-OX) 400 (241.3 Mg) MG tablet Take 1 tablet (400 mg total) by mouth 2 (two) times daily. 03/24/19   Pokhrel, Corrie Mckusick, MD  magnesium oxide (MAG-OX) 400 MG tablet Take 1 tablet by mouth 2 (two) times daily. 03/24/19   [provider]  megestrol (MEGACE) 20 MG tablet Take 20 mg by mouth daily. 01/05/19   [provider]  methocarbamol (ROBAXIN) 500 MG tablet Take 1 tablet (500 mg total) by mouth every 6 (six) hours as needed for muscle spasms. 03/20/2019   Mcarthur Rossetti, MD  Multiple Vitamin (MULTIVITAMIN WITH MINERALS) TABS tablet Take 1 tablet by mouth daily. Patient not taking: Reported on 03/21/2019 04/13/17   Velvet Bathe, MD  omeprazole (PRILOSEC) 40 MG capsule TAKE 1 TABLET DAILY TO REDUCE STOMACH ACID 03/03/19    [provider]  ondansetron (ZOFRAN) 4 MG tablet Take 1 tablet (4 mg total) by mouth every 6 (six) hours as needed for nausea. 03/24/19   Pokhrel, Laxman, MD  ondansetron (ZOFRAN-ODT) 4 MG disintegrating tablet DISSOLVE 1 TABLET IN MOUTH 3 TIMES A DAY AS NEEDED FOR NAUSEA 03/16/19   [provider]  OS-CAL CALCIUM + D3 500-200 MG-UNIT TABS Take 1 tablet by mouth 2 (two) times daily. 03/24/19   [provider]  oxyCODONE-acetaminophen (PERCOCET/ROXICET) 5-325 MG tablet Take 1 tablet by mouth every 6 (six) hours as needed for severe pain. 03/26/2019   Mcarthur Rossetti, MD  pantoprazole (PROTONIX) 40 MG tablet Take 1 tablet (40 mg total) by mouth daily for 15 days. 03/24/19 04/08/19  Pokhrel, Corrie Mckusick, MD  polyethylene glycol (MIRALAX / GLYCOLAX) packet Take 17 g by mouth daily. Patient not taking: Reported on 03/21/2019 07/27/18   Mariel Aloe, MD  potassium chloride (K-DUR,KLOR-CON) 10 MEQ tablet Take 10 mEq by mouth 2 (two) times daily.    [provider]  promethazine (PHENERGAN) 25 MG tablet Take 25 mg by  mouth every 6 (six) hours as needed. for nausea 01/24/19   [provider]  sucralfate (CARAFATE) 1 g tablet TAKE 1 TABLET (1 G TOTAL) BY MOUTH 4 TIMES DAILY. 01/24/19   [provider]  thiamine 100 MG tablet Take 1 tablet (100 mg total) by mouth daily. 03/24/19   Pokhrel, Corrie Mckusick, MD  valACYclovir (VALTREX) 1000 MG tablet  01/03/19   [provider]    Physical Exam: Vitals:   04/08/2019 1930 04/01/2019 1945 03/27/2019 2000 04/10/2019 2100  BP: (!) '91/56 94/64 97/69 ' (!) 97/59  Pulse: 93 79 89 94  Resp: '16 19 20 ' (!) 23  Temp:      TempSrc:      SpO2: 93% 95% 93% 94%  Weight:      Height:        Physical Exam  Constitutional: She is oriented to person, place, and time. No distress.  HENT:  Head: Normocephalic.  Dry mucous membranes  Eyes: Pupils are equal, round, and reactive to light. EOM are normal.  Neck: Neck supple.    Cardiovascular: Normal rate, regular rhythm and intact distal pulses.  Pulmonary/Chest: Effort normal and breath sounds normal. No respiratory distress. She has no wheezes. She has no rales.  Abdominal: Soft. Bowel sounds are normal. She exhibits no distension. There is abdominal tenderness. There is guarding. There is no rebound.  Suprapubic region and right lower quadrant tender to palpation with guarding.  No epigastric tenderness.  Musculoskeletal:        General: No edema.  Neurological: She is alert and oriented to person, place, and time. No cranial nerve deficit.  Speech fluent, tongue midline, no facial droop Moving all extremities spontaneously.  Skin: Skin is warm and dry. She is not diaphoretic.     Labs on Admission: I have personally reviewed following labs and imaging studies  CBC: Recent Labs  Lab 03/25/2019 1550  WBC 19.5*  NEUTROABS 18.0*  HGB 9.3*  HCT 28.7*  MCV 110.4*  PLT 536   Basic Metabolic Panel: Recent Labs  Lab 03/29/2019 1513 03/19/2019 1550  NA  --  132*  K  --  3.0*  CL  --  100  CO2  --  21*  GLUCOSE  --  277*  BUN  --  32*  CREATININE  --  0.63  CALCIUM  --  7.8*  MG 2.2  --    GFR: Estimated Creatinine Clearance: 50.8 mL/min (by C-G formula based on SCr of 0.63 mg/dL). Liver Function Tests: Recent Labs  Lab 04/09/2019 1550  AST 51*  ALT 37  ALKPHOS 215*  BILITOT 0.5  PROT 5.3*  ALBUMIN 2.3*   Recent Labs  Lab 04/08/2019 1550  LIPASE 82*   No results for input(s): AMMONIA in the last 168 hours. Coagulation Profile: No results for input(s): INR, PROTIME in the last 168 hours. Cardiac Enzymes: No results for input(s): CKTOTAL, CKMB, CKMBINDEX, TROPONINI in the last 168 hours. BNP (last 3 results) No results for input(s): PROBNP in the last 8760 hours. HbA1C: No results for input(s): HGBA1C in the last 72 hours. CBG: No results for input(s): GLUCAP in the last 168 hours. Lipid Profile: No results for input(s): CHOL, HDL,  LDLCALC, TRIG, CHOLHDL, LDLDIRECT in the last 72 hours. Thyroid Function Tests: No results for input(s): TSH, T4TOTAL, FREET4, T3FREE, THYROIDAB in the last 72 hours. Anemia Panel: No results for input(s): VITAMINB12, FOLATE, FERRITIN, TIBC, IRON, RETICCTPCT in the last 72 hours. Urine analysis:    Component Value  Date/Time   COLORURINE AMBER (A) 03/24/2019 1727   APPEARANCEUR CLOUDY (A) 03/21/2019 1727   LABSPEC 1.017 04/01/2019 1727   PHURINE 7.0 03/23/2019 1727   GLUCOSEU NEGATIVE 03/26/2019 1727   HGBUR MODERATE (A) 03/19/2019 1727   BILIRUBINUR NEGATIVE 04/12/2019 1727   KETONESUR NEGATIVE 03/15/2019 1727   PROTEINUR NEGATIVE 03/23/2019 1727   NITRITE POSITIVE (A) 04/03/2019 1727   LEUKOCYTESUR LARGE (A) 03/24/2019 1727    Radiological Exams on Admission: Ct Abdomen Pelvis W Contrast  Addendum Date: 03/28/2019   ADDENDUM REPORT: 03/27/2019 20:04 ADDENDUM: Addendum created after discussing role of interventional radiology with Dr. Maryan Rued. In addition to the previously noted findings, there is also nonocclusive splenic vein/portal vein thrombus at the confluence of splenic vein, superior mesenteric vein, and the portal vein. This is likely a consequence of the pancreatitis/inflammation. These results were discussed by telephone at the time of interpretation on 04/02/2019 at 8:04 pm with Dr. Blanchie Dessert Electronically Signed   By: Corrie Mckusick D.O.   On: 03/25/2019 20:04   Result Date: 04/03/2019 CLINICAL DATA:  Abdominal pain, fever EXAM: CT ABDOMEN AND PELVIS WITH CONTRAST TECHNIQUE: Multidetector CT imaging of the abdomen and pelvis was performed using the standard protocol following bolus administration of intravenous contrast. CONTRAST:  31m OMNIPAQUE IOHEXOL 300 MG/ML  SOLN COMPARISON:  CT abdomen/pelvis 01/31/2019 FINDINGS: Lower chest: No acute abnormality. Hepatobiliary: No focal liver abnormality is seen. Status post cholecystectomy. No biliary dilatation. Pancreas:  Calcifications in the pancreas consistent with history of chronic pancreatitis. Peripancreatic inflammatory changes around the mid body and tail most concerning for acute pancreatitis. 2.1 cm hypodense fluid collection adjacent to the pancreatic tail and posterior fundus of the stomach concerning for a pseudocyst. Spleen: Choose Adrenals/Urinary Tract: Adrenal glands are unremarkable. Kidneys are normal, without renal calculi, focal lesion, or hydronephrosis. Bladder is unremarkable. Stomach/Bowel: Stomach is within normal limits. Appendix appears normal. No evidence of bowel wall thickening, distention, or inflammatory changes. Diverticulosis without evidence of diverticulitis. Vascular/Lymphatic: Abdominal aortic atherosclerosis. No lymphadenopathy. Reproductive: Uterine fibroid.  No other adnexal mass. Other: No abdominal wall hernia or abnormality. No abdominopelvic ascites. Musculoskeletal: No acute osseous abnormality. No aggressive osseous lesion. Degenerative disc disease with disc height loss at L5-S1 with bilateral facet arthropathy. IMPRESSION: 1. Acute pancreatitis involving the body and tail the pancreas. 2.1 cm hypodense fluid collection adjacent to the pancreatic tail and posterior fundus of the stomach concerning for a pseudocyst which appears to be within the posterior wall of the stomach. 2. Diverticulosis without evidence of diverticulitis. 3.  Aortic Atherosclerosis (ICD10-I70.0). Electronically Signed: By: HKathreen DevoidOn: 03/22/2019 17:33   Dg Chest Port 1 View  Result Date: 04/08/2019 CLINICAL DATA:  73year old female with a history of left hip pain EXAM: PORTABLE CHEST 1 VIEW COMPARISON:  03/21/2019, 07/24/2018 FINDINGS: Cardiomediastinal silhouette unchanged in size and contour. No evidence of central vascular congestion. No interlobular septal thickening. No pneumothorax. No pleural effusion. Coarsened interstitial markings are similar to the comparison. No confluent airspace disease.  No displaced fracture. IMPRESSION: Chronic changes without evidence of acute cardiopulmonary disease Electronically Signed   By: JCorrie MckusickD.O.   On: 03/19/2019 15:29   Dg Hip Unilat With Pelvis 2-3 Views Right  Result Date: 03/21/2019 CLINICAL DATA:  73year old female with pain. Prior left hip fracture fixation 03/22/2019 EXAM: DG HIP (WITH OR WITHOUT PELVIS) 2-3V RIGHT COMPARISON:  CT today FINDINGS: Retained contrast within the urinary bladder, distended. Formed stool within the rectum. Purewick urine collection system. Bony pelvic  ring appears intact with no acute displaced fracture. Right hip projects normally over the acetabula with no proximal right femoral fracture identified. Surgical changes of left hip repair with no significant callus formation at the site of recent hip fracture. Vascular calcifications. IMPRESSION: Negative for acute bony abnormality. Urinary bladder with excreted contrast, partially distended. Formed stool within the rectal vault. Atherosclerosis. Electronically Signed   By: Corrie Mckusick D.O.   On: 03/27/2019 21:15    EKG: Independently reviewed.  Sinus tachycardia (heart rate 115).  Assessment/Plan Principal Problem:   Sepsis ([place]) Active Problems:   Acute pancreatitis   UTI (urinary tract infection)   Pancreatic pseudocyst   Portal vein thrombosis   Sepsis secondary to urinary tract infection and acute pancreatitis complicated by pancreatic pseudocyst -Febrile with temperature 101.9 F.  Tachycardic.  Blood pressure soft.  White count 19.5 with left shift.  Lactic acid 2.9 >1.6 with 2 L IV fluid boluses.  Blood pressure has improved and tachycardia resolved after IV fluid boluses.  Lipase mildly elevated at 82.  AST mildly elevated at 51, alk phos 215.  ALT and T bili normal.  UA with positive nitrite, large amount of leukocytes, greater than 50 WBCs, and many bacteria.  Chest x-ray not suggestive of pneumonia.  CT showing acute pancreatitis involving the  body and tail of the pancreas.  2.1 cm hypodense fluid collection adjacent to the pancreatic tail and posterior fundus of the stomach concerning for pseudocyst which appears to be within the posterior wall of the stomach. -Continue IV fluid resuscitation -Continue antibiotics including aztreonam, ciprofloxacin, and Flagyl.  Allergic to penicillins. -IV morphine 1 mg every 3 hours as needed for pain -Continue to monitor LFTs -Continue to monitor CBC -Blood culture x2 -Urine culture -ED physician discussed with interventional radiology.  No intervention recommended for the pseudocyst at this time. -Discussed with [facility], recommendation is to continue antibiotics, IV fluids, and clear liquid diet.  GI will consult in a.m.  Suspicion for portal vein thrombosis Interventional radiologist reviewed patient's CT scan and is concerned for nonocclusive splenic vein/portal vein thrombosis at the confluence of splenic vein, superior mesenteric vein, and the portal vein.  This is thought to be likely a consequence of the pancreatitis/inflammation.  -IV heparin has been started   Right hip pain Patient is complaining of pain in her right hip.  Not complaining of any left hip pain.  No signs of left hip surgical site infection. -X-ray of right hip has been ordered  Acute metabolic encephalopathy Likely related to underlying infection.  Patient appears mildly confused.  No focal neuro deficit. -Management of infection as mentioned above -Continue to monitor  Hypokalemia Potassium 3.0.  Likely secondary to decreased p.o. intake. -Replete potassium.  Check magnesium level.  Continue to monitor BMP.  Hyperglycemia Random blood glucose 277.  Likely related to sepsis.  No documented history of diabetes. -Check A1c -Monitor CBGs  Chronic macrocytic anemia Hemoglobin 9.3 and MCV 110.4.  Recent baseline hemoglobin ranging from 10.6-13.0.  No signs of active bleeding. -Anemia panel -Continue to monitor  CBC  Alcohol abuse -CIWA protocol; Ativan PRN -Thiamine, folate, multivitamin  Tobacco use -Counseled to quit -NicoDerm patch  DVT prophylaxis: Heparin Code Status: Full code Family Communication: No family available. Disposition Plan: Anticipate discharge after clinical improvement. Consults called: [facility] (Dr. Alessandra Bevels), interventional radiology (Dr. Jacqualyn Posey) Admission status: It is my clinical opinion that admission to INPATIENT is reasonable and necessary in this 73 y.o. female  presenting with sepsis  secondary to acute pancreatitis complicated by pancreatic pseudocyst and UTI, concern for portal vein thrombosis  Workup and treatment include IV fluid resuscitation, IV antibiotics, and IV heparin.  GI consultation in a.m.  Given the aforementioned, the predictability of an adverse outcome is felt to be significant. I expect that the patient will require at least 2 midnights in the hospital to treat this condition.   This chart was dictated using voice recognition software.  Despite best efforts to proofread, errors can occur which can change the documentation meaning.  Shela Leff MD [facility] Pager [phone]  If 7PM-7AM, please contact night-coverage [email or web address] Password [email or web address]  04/07/2019, 9:22 PM

## 2019-04-05 NOTE — ED Notes (Signed)
Date and time results received: 04/22/201632 (use smartphrase ".now" to insert current time)  Test: Lactic Acid Critical Value: 2.9  Name of Provider Notified: Dr. Anitra Lauth  Orders Received? Or Actions Taken?:

## 2019-04-05 NOTE — ED Notes (Signed)
Pt placed on bedpan in attempt to collect urine sample.  

## 2019-04-05 NOTE — Progress Notes (Signed)
Pharmacy Antibiotic Note  April [name] is a 73 y.o. female admitted on 04/13/2019 with sepsis possibly due to UTI or pancreatitis  Pharmacy has been consulted for aztreonam/cipro dosing. Flagyl per Md  Plan: 1) Aztreonam 1g IV q8 2) Cipro 400mg  IV q12 3) Recommend d/c aztreonam and continue Cipro with Flagyl  Height: 5\' 6"  (167.6 cm) Weight: 110 lb (49.9 kg) IBW/kg (Calculated) : 59.3  Temp (24hrs), Avg:100 F (37.8 C), Min:98 F (36.7 C), Max:101.9 F (38.8 C)  Recent Labs  Lab 04/12/2019 1547 04/02/2019 1550 03/24/2019 1742  WBC  --  19.5*  --   CREATININE  --  0.63  --   LATICACIDVEN 2.9*  --  1.6    Estimated Creatinine Clearance: 50.8 mL/min (by C-G formula based on SCr of 0.63 mg/dL).    Allergies  Allergen Reactions  . Naltrexone Shortness Of Breath  . Rocephin [Ceftriaxone Sodium In Dextrose] Anaphylaxis  . Penicillins Rash    Has patient had a PCN reaction causing immediate rash, facial/tongue/throat swelling, SOB or lightheadedness with hypotension: Yes immediate rash  Has patient had a PCN reaction causing severe rash involving mucus membranes or skin necrosis: Yes Has patient had a PCN reaction that required hospitalization": No Has patient had a PCN reaction occurring within the last 10 years: Yes If all of the above answers are "NO", then may proceed with Cephalosporin use.      Thank you for allowing pharmacy to be a part of this patient's care.  Berkley Harvey 03/22/2019 8:38 PM

## 2019-04-05 NOTE — ED Triage Notes (Addendum)
Pt BIBA from home c/o left hip pain.  Hip surgery two weeks ago, unable to ambulate since. Daughter lives with her and has to assist with ADLs.  Daughter reports onset of fever today.    Pt AOx4.   Pt with hx of UTIs, currently with strong, foul smelling odor.    Pt received 600 mL NaCl IV bolus, 1000 mg tylenol PO at 1430 by EMS.

## 2019-04-06 ENCOUNTER — Telehealth (INDEPENDENT_AMBULATORY_CARE_PROVIDER_SITE_OTHER): Payer: Self-pay | Admitting: Orthopaedic Surgery

## 2019-04-06 ENCOUNTER — Encounter (HOSPITAL_COMMUNITY): Payer: Self-pay

## 2019-04-06 ENCOUNTER — Other Ambulatory Visit: Payer: Self-pay

## 2019-04-06 ENCOUNTER — Inpatient Hospital Stay (INDEPENDENT_AMBULATORY_CARE_PROVIDER_SITE_OTHER): Payer: Self-pay | Admitting: Orthopaedic Surgery

## 2019-04-06 DIAGNOSIS — N3000 Acute cystitis without hematuria: Secondary | ICD-10-CM

## 2019-04-06 DIAGNOSIS — K852 Alcohol induced acute pancreatitis without necrosis or infection: Secondary | ICD-10-CM

## 2019-04-06 DIAGNOSIS — K863 Pseudocyst of pancreas: Secondary | ICD-10-CM

## 2019-04-06 LAB — GLUCOSE, CAPILLARY
Glucose-Capillary: 129 mg/dL — ABNORMAL HIGH (ref 70–99)
Glucose-Capillary: 148 mg/dL — ABNORMAL HIGH (ref 70–99)
Glucose-Capillary: 139 mg/dL — ABNORMAL HIGH (ref 70–99)
Glucose-Capillary: 146 mg/dL — ABNORMAL HIGH (ref 70–99)

## 2019-04-06 LAB — BASIC METABOLIC PANEL
Anion gap: 9 (ref 5–15)
BUN: 20 mg/dL (ref 8–23)
CO2: 17 mmol/L — ABNORMAL LOW (ref 22–32)
Calcium: 7.5 mg/dL — ABNORMAL LOW (ref 8.9–10.3)
Chloride: 113 mmol/L — ABNORMAL HIGH (ref 98–111)
Creatinine, Ser: 0.4 mg/dL — ABNORMAL LOW (ref 0.44–1.00)
GFR calc Af Amer: >60 mL/min (ref >60)
GFR calc non Af Amer: >60 mL/min (ref >60)
Glucose, Bld: 165 mg/dL — ABNORMAL HIGH (ref 70–99)
Potassium: 4.9 mmol/L (ref 3.5–5.1)
Sodium: 139 mmol/L (ref 135–145)

## 2019-04-06 LAB — BLOOD CULTURE ID PANEL (REFLEXED)

## 2019-04-06 LAB — HEPARIN LEVEL (UNFRACTIONATED)
Heparin Unfractionated: 0.18 IU/mL — ABNORMAL LOW (ref 0.30–0.70)
Heparin Unfractionated: 0.1 IU/mL — ABNORMAL LOW (ref 0.30–0.70)
Heparin Unfractionated: 0.31 IU/mL (ref 0.30–0.70)

## 2019-04-06 LAB — CBC
HCT: 29.8 % — ABNORMAL LOW (ref 36.0–46.0)
Hemoglobin: 9.4 g/dL — ABNORMAL LOW (ref 12.0–15.0)
MCH: 35.3 pg — ABNORMAL HIGH (ref 26.0–34.0)
MCHC: 31.5 g/dL (ref 30.0–36.0)
MCV: 112 fL — ABNORMAL HIGH (ref 80.0–100.0)
Platelets: 270 10*3/uL (ref 150–400)
RBC: 2.66 MIL/uL — ABNORMAL LOW (ref 3.87–5.11)
RDW: 14.9 % (ref 11.5–15.5)
WBC: 14 10*3/uL — ABNORMAL HIGH (ref 4.0–10.5)
nRBC: 0 % (ref 0.0–0.2)

## 2019-04-06 LAB — HEPATIC FUNCTION PANEL
ALT: 29 U/L (ref 0–44)
AST: 33 U/L (ref 15–41)
Albumin: 2.2 g/dL — ABNORMAL LOW (ref 3.5–5.0)
Alkaline Phosphatase: 200 U/L — ABNORMAL HIGH (ref 38–126)
Bilirubin, Direct: 0.2 mg/dL (ref 0.0–0.2)
Indirect Bilirubin: 0.3 mg/dL (ref 0.3–0.9)
Total Bilirubin: 0.5 mg/dL (ref 0.3–1.2)
Total Protein: 5 g/dL — ABNORMAL LOW (ref 6.5–8.1)

## 2019-04-06 LAB — FERRITIN: Ferritin: 42 ng/mL (ref 11–307)

## 2019-04-06 LAB — URINE CULTURE: Special Requests: NORMAL

## 2019-04-06 LAB — HEMOGLOBIN A1C
Hgb A1c MFr Bld: 5.9 % — ABNORMAL HIGH (ref 4.8–5.6)
Mean Plasma Glucose: 122.63 mg/dL

## 2019-04-06 LAB — RETICULOCYTES
Immature Retic Fract: 25.9 % — ABNORMAL HIGH (ref 2.3–15.9)
RBC.: 2.66 MIL/uL — ABNORMAL LOW (ref 3.87–5.11)
Retic Count, Absolute: 83.3 10*3/uL (ref 19.0–186.0)
Retic Ct Pct: 3.1 % (ref 0.4–3.1)

## 2019-04-06 LAB — IRON AND TIBC
Iron: 8 ug/dL — ABNORMAL LOW (ref 28–170)
Saturation Ratios: 6 % — ABNORMAL LOW (ref 10.4–31.8)
TIBC: 136 ug/dL — ABNORMAL LOW (ref 250–450)
UIBC: 128 ug/dL

## 2019-04-06 LAB — VITAMIN B12: Vitamin B-12: 2035 pg/mL — ABNORMAL HIGH (ref 180–914)

## 2019-04-06 LAB — FOLATE: Folate: 21.9 ng/mL (ref >5.9)

## 2019-04-06 MED ORDER — SACCHAROMYCES BOULARDII 250 MG PO CAPS
250.0000 mg | ORAL_CAPSULE | Freq: Two times a day (BID) | ORAL | Status: DC
  Administered 2019-04-06 – 2019-04-21 (×28): 250 mg via ORAL
  Filled 2019-04-06 (×32): qty 1

## 2019-04-06 MED ORDER — SODIUM CHLORIDE 0.9 % IV SOLN
2.0000 g | Freq: Three times a day (TID) | INTRAVENOUS | Status: DC
  Administered 2019-04-06 – 2019-04-07 (×3): 2 g via INTRAVENOUS
  Filled 2019-04-06 (×4): qty 2

## 2019-04-06 MED ORDER — SODIUM CHLORIDE 0.9 % IV BOLUS
500.0000 mL | Freq: Once | INTRAVENOUS | Status: AC
  Administered 2019-04-06: 500 mL via INTRAVENOUS

## 2019-04-06 MED ORDER — HEPARIN (PORCINE) 25000 UT/250ML-% IV SOLN
1150.0000 [IU]/h | INTRAVENOUS | Status: DC
  Administered 2019-04-06 (×2): 1150 [IU]/h via INTRAVENOUS
  Filled 2019-04-06: qty 250

## 2019-04-06 MED ORDER — SODIUM CHLORIDE 0.9 % IV SOLN
INTRAVENOUS | Status: DC
  Administered 2019-04-06 (×3): via INTRAVENOUS

## 2019-04-06 MED ORDER — ORAL CARE MOUTH RINSE
15.0000 mL | Freq: Two times a day (BID) | OROMUCOSAL | Status: DC
  Administered 2019-04-06 – 2019-04-26 (×28): 15 mL via OROMUCOSAL

## 2019-04-06 NOTE — Telephone Encounter (Signed)
Called daughter to let her know of the below message and she said nurse just called her from hospital saying she's in the ICU now and septic

## 2019-04-06 NOTE — Telephone Encounter (Signed)
Patient is in the hospital and the ER doctor removed her staples from the her surgery cite.  Daughter wants to know if Dr. Magnus Ivan needs to see her in the office.  CB#[phone]. Thank you.

## 2019-04-06 NOTE — Progress Notes (Signed)
Pt BP dropped to 98/58, MAP 65, HR occasionally elevated to 120s-130s, non-sustaining. MD notified. Provider came to bedside to assess patient and placed order for 500cc bolus. Will continue to monitor.

## 2019-04-06 NOTE — Progress Notes (Signed)
ANTICOAGULATION CONSULT NOTE - Follow Up Consult  Pharmacy Consult for Heparin Indication: portal vein thrombosis  Allergies  Allergen Reactions  . Naltrexone Shortness Of Breath  . Rocephin [Ceftriaxone Sodium In Dextrose] Anaphylaxis  . Penicillins Rash    Has patient had a PCN reaction causing immediate rash, facial/tongue/throat swelling, SOB or lightheadedness with hypotension: Yes immediate rash  Has patient had a PCN reaction causing severe rash involving mucus membranes or skin necrosis: Yes Has patient had a PCN reaction that required hospitalization": No Has patient had a PCN reaction occurring within the last 10 years: Yes If all of the above answers are "NO", then may proceed with Cephalosporin use.     Patient Measurements: Height: 5\' 6"  (167.6 cm) Weight: 110 lb (49.9 kg) IBW/kg (Calculated) : 59.3 Heparin Dosing Weight:   Vital Signs: Temp: 100.2 F (37.9 C) (04/23 0400) Temp Source: Axillary (04/23 0400) BP: 130/84 (04/23 0500) Pulse Rate: 107 (04/23 0500)  Labs: Recent Labs    04/28/19 1550 04/06/19 0302  HGB 9.3* 9.4*  HCT 28.7* 29.8*  PLT 329 270  HEPARINUNFRC  --  0.18*  CREATININE 0.63  --     Estimated Creatinine Clearance: 50.8 mL/min (by C-G formula based on SCr of 0.63 mg/dL).   Medications:  Infusions:  . sodium chloride 5 mL/hr at 04-28-2019 2030  . sodium chloride 150 mL/hr at 04/06/19 0455  . aztreonam Stopped (04/06/19 1610)  . ciprofloxacin 400 mg (04/06/19 0454)  . heparin 900 Units/hr (04/06/19 0418)  . metronidazole Stopped (04/06/19 0416)    Assessment: Patient with low heparin level.  Level drawn a little early, but will still increase drip.  No heparin issues per RN.  Goal of Therapy:  Heparin level 0.3-0.7 units/ml Monitor platelets by anticoagulation protocol: Yes   Plan:  Increase heparin to 1000 units/hr Recheck level at 1400  [address] [place]quenette Shone Crowford 04/06/2019,5:50 AM

## 2019-04-06 NOTE — Progress Notes (Signed)
PHARMACY - PHYSICIAN COMMUNICATION CRITICAL VALUE ALERT - BLOOD CULTURE IDENTIFICATION (BCID)  April [name] is an 73 y.o. female who presented to [facility] on 03/26/2019 with a chief complaint of fever and hip pain.  Abd CT on Abd CT on 4/22 showed acute pancreatitis and nonocclusive splenic vein/portal vein thrombus.  She currently on ciprofloxacin for suspected UTI.  Name of physician (or Provider) Contacted: Dr. Ella Jubilee  Current antibiotics: ciprofloxacin  Changes to prescribed antibiotics recommended:  Change cipro to aztreonam  Results for orders placed or performed during the hospital encounter of 03/23/2019  Blood Culture ID Panel (Reflexed) (Collected: 03/25/2019  3:41 PM)  Result Value Ref Range   Enterococcus species NOT DETECTED NOT DETECTED   Listeria monocytogenes NOT DETECTED NOT DETECTED   Staphylococcus species NOT DETECTED NOT DETECTED   Staphylococcus aureus (BCID) NOT DETECTED NOT DETECTED   Streptococcus species NOT DETECTED NOT DETECTED   Streptococcus agalactiae NOT DETECTED NOT DETECTED   Streptococcus pneumoniae NOT DETECTED NOT DETECTED   Streptococcus pyogenes NOT DETECTED NOT DETECTED   Acinetobacter baumannii NOT DETECTED NOT DETECTED   Enterobacteriaceae species DETECTED (A) NOT DETECTED   Enterobacter cloacae complex NOT DETECTED NOT DETECTED   Escherichia coli NOT DETECTED NOT DETECTED   Klebsiella oxytoca NOT DETECTED NOT DETECTED   Klebsiella pneumoniae NOT DETECTED NOT DETECTED   Proteus species DETECTED (A) NOT DETECTED   Serratia marcescens NOT DETECTED NOT DETECTED   Carbapenem resistance NOT DETECTED NOT DETECTED   Haemophilus influenzae NOT DETECTED NOT DETECTED   Neisseria meningitidis NOT DETECTED NOT DETECTED   Pseudomonas aeruginosa NOT DETECTED NOT DETECTED   Candida albicans NOT DETECTED NOT DETECTED   Candida glabrata NOT DETECTED NOT DETECTED   Candida krusei NOT DETECTED NOT DETECTED   Candida parapsilosis NOT DETECTED NOT  DETECTED   Candida tropicalis NOT DETECTED NOT DETECTED    Morene Cecilio P 04/06/2019  10:02 AM

## 2019-04-06 NOTE — Telephone Encounter (Signed)
Berline Lopes, patient's daughter left a message stating that she had another question for Dr. Magnus Ivan.  CB#[id number].  Thank you.

## 2019-04-06 NOTE — Progress Notes (Signed)
PROGRESS NOTE    April [name]  HKG:[id number] DOB: [birth year] DOA: 2019-04-13 PCP: Richmond Campbell., PA-C    Brief Narrative:  73 year old female who presented with right hip pain and urinary tract symptoms.  She does have significant past medical history for hypertension, dyslipidemia, pancreatitis, alcohol abuse, depression, anxiety and GERD.  She had a recent left intratrochanteric femur fracture status post intramedullary nail placement March 22, 2019.  Patient had several days of right sided abdominal pain, complicated with fever, confusion and severe anorexia.  She had generalized weakness, dysuria, urinary frequency and urgency.  Apparently patient continued drinks alcohol daily.  On her initial physical examination she was febrile 101.9 F, her blood pressure was 91/56, heart rate 93, respiratory rate 23, oxygen saturation 93%, she had dry mucous membranes, her lungs were clear to auscultation bilaterally, heart S1-S2 present and rhythmic, tachycardic, abdomen with positive tenderness and guarding, mainly at the suprapubic region and right lower quadrant, no lower extremity edema.  Sodium 132, potassium 3.0, chloride 100, bicarb 21, glucose 277, BUN 32, creatinine 0.63, lipase 82, AST 51, ALT 37, white count 19.5, hemoglobin 9.3, hematocrit 28.7, platelets 329, urinalysis had more than 50 white cells, her chest radiograph had left rotation, positive hyperinflation, no infiltrates.  Abdominal CT with acute pancreatitis involving the body and tail of the pancreas.  2.1 cm hypodense fluid collection at TS into the pancreatic tail and posterior fundus of the stomach concerning for pseudocyst.  Nonocclusive splenic vein/portal vein thrombosis at the confluence of the splenic vein, superior mesenteric vein and portal vein.  EKG 115 bpm, with left axis deviation, normal intervals, sinus rhythm with below complexes, no significant ST segment or T wave changes.  Patient was admitted to the hospital with  a working diagnosis of sepsis due to acute pancreatitis and urinary tract infection.  Assessment & Plan:   Principal Problem:   Sepsis (HCC) Active Problems:   Acute pancreatitis   UTI (urinary tract infection)   Pancreatic pseudocyst   Portal vein thrombosis   1. Sepsis due to urinary tract infection (present on admission). Improved blood pressure but patient continue tachycardic, will continue IV fluids with isotonic solutions at 100 ml per H, antibiotic therapy with ceftriaxone. Follow up with cultures and temperature curve. T max 37.9. WBC down to 14 from 19.5.   2. Acute pancreatitis complicated with pseudocyst and portal vein thrombosis. Will continue IV fluids, and as needed IV analgesics and antiemetics. Clear liquid diet as tolerated. No signs of necrosis per CT will hold on aztreonam and metronidazole for now. Continue anticoagulation with IV heparin, follow PTT per pharmacy protocol.   3. Alcohol abuse with potential for withdrawal. Patient tachycardic and confused, will continue benzodiazepine therapy per protocol CIWA. Clear liquid diet, thiamine and multivitamins. Neuro checks per unit protocol.   4. Hypokalemia and hyponatremia. Will continue IV fluids with normal saline at 100 ml per H, continue K correction with po kcl, will follow on renal panel in am. Preserved renal function with serum cr at 0.63.   5. Metabolic encephalopathy. Likely due to sepsis, will continue to hold on amitriptyline, and clonazepam for now.    DVT prophylaxis: heparin IV   Code Status:  full Family Communication:  No family at the bedside Disposition Plan/ discharge barriers:  Pending clinical improvement.   Body mass index is 17.75 kg/m. Malnutrition Type:      Malnutrition Characteristics:      Nutrition Interventions:     RN Pressure Injury Documentation:  Consultants:     Procedures:     Antimicrobials:   Ceftriaxone     Subjective: Patient is somnolent  but easy to arouse, continue to have abdominal pain, severe in intensity, with no radiation, no associated nausea or vomiting, no improving factors. Mild confusion but no agitation.   Objective: Vitals:   04/06/19 0301 04/06/19 0400 04/06/19 0500 04/06/19 0600  BP:  110/67 130/84 119/69  Pulse: (!) 121 (!) 108 (!) 107 (!) 103  Resp: (!) 25 (!) 21 (!) 23 (!) 21  Temp:  100.2 F (37.9 C)    TempSrc:  Axillary    SpO2: 96% 99% 100% 100%  Weight:      Height:        Intake/Output Summary (Last 24 hours) at 04/06/2019 0750 Last data filed at 04/06/2019 0557 Gross per 24 hour  Intake 3904.17 ml  Output 275 ml  Net 3629.17 ml   Filed Weights   03/15/2019 1528  Weight: 49.9 kg    Examination:   General: deconditioned and ill looking appearing  Neurology: somnolent, non focal, easy to arouse, mild confusion.  E ENT: positive pallor, no icterus, oral mucosa dry Cardiovascular: No JVD. S1-S2 present, rhythmic, no gallops, rubs, or murmurs. Trace lower extremity edema. Pulmonary: positive breath sounds bilaterally, adequate air movement, no wheezing, rhonchi or rales. Gastrointestinal. Abdomen tender to palpation epigastric and lower quadrants, with no organomegaly. No rebound. Skin. No rashes Musculoskeletal: no joint deformities     Data Reviewed: I have personally reviewed following labs and imaging studies  CBC: Recent Labs  Lab 03/15/2019 1550 04/06/19 0302  WBC 19.5* 14.0*  NEUTROABS 18.0*  --   HGB 9.3* 9.4*  HCT 28.7* 29.8*  MCV 110.4* 112.0*  PLT 329 270   Basic Metabolic Panel: Recent Labs  Lab 04/04/2019 1513 03/20/2019 1550  NA  --  132*  K  --  3.0*  CL  --  100  CO2  --  21*  GLUCOSE  --  277*  BUN  --  32*  CREATININE  --  0.63  CALCIUM  --  7.8*  MG 2.2  --    GFR: Estimated Creatinine Clearance: 50.8 mL/min (by C-G formula based on SCr of 0.63 mg/dL). Liver Function Tests: Recent Labs  Lab 03/28/2019 1550 04/06/19 0302  AST 51* 33  ALT 37 29   ALKPHOS 215* 200*  BILITOT 0.5 0.5  PROT 5.3* 5.0*  ALBUMIN 2.3* 2.2*   Recent Labs  Lab 04/07/2019 1550  LIPASE 82*   No results for input(s): AMMONIA in the last 168 hours. Coagulation Profile: No results for input(s): INR, PROTIME in the last 168 hours. Cardiac Enzymes: No results for input(s): CKTOTAL, CKMB, CKMBINDEX, TROPONINI in the last 168 hours. BNP (last 3 results) No results for input(s): PROBNP in the last 8760 hours. HbA1C: Recent Labs    04/06/19 0302  HGBA1C 5.9*   CBG: Recent Labs  Lab 03/24/2019 2136  GLUCAP 149*   Lipid Profile: No results for input(s): CHOL, HDL, LDLCALC, TRIG, CHOLHDL, LDLDIRECT in the last 72 hours. Thyroid Function Tests: No results for input(s): TSH, T4TOTAL, FREET4, T3FREE, THYROIDAB in the last 72 hours. Anemia Panel: Recent Labs    04/06/19 0302  VITAMINB12 2,035*  FOLATE 21.9  FERRITIN 42  TIBC 136*  IRON 8*  RETICCTPCT 3.1      Radiology Studies: I have reviewed all of the imaging during this hospital visit personally     Scheduled Meds: . Chlorhexidine Gluconate  Cloth  6 each Topical Daily  . folic acid  1 mg Oral Daily  . mouth rinse  15 mL Mouth Rinse BID  . multivitamin with minerals  1 tablet Oral Daily  . nicotine  21 mg Transdermal Daily  . thiamine injection  100 mg Intravenous Daily   Continuous Infusions: . sodium chloride 5 mL/hr at 03/17/2019 2030  . sodium chloride 150 mL/hr at 04/06/19 0557  . aztreonam Stopped (04/06/19 4098)  . ciprofloxacin Stopped (04/06/19 0554)  . heparin 1,000 Units/hr (04/06/19 0601)  . metronidazole Stopped (04/06/19 0416)     LOS: 1 day        Mauricio Annett Gula, MD

## 2019-04-06 NOTE — Progress Notes (Signed)
ANTICOAGULATION CONSULT NOTE - Follow Up Consult  Pharmacy Consult for heparin Indication: portal vein thrombosis  Allergies  Allergen Reactions  . Naltrexone Shortness Of Breath  . Rocephin [Ceftriaxone Sodium In Dextrose] Anaphylaxis  . Penicillins Rash    Has patient had a PCN reaction causing immediate rash, facial/tongue/throat swelling, SOB or lightheadedness with hypotension: Yes immediate rash  Has patient had a PCN reaction causing severe rash involving mucus membranes or skin necrosis: Yes Has patient had a PCN reaction that required hospitalization": No Has patient had a PCN reaction occurring within the last 10 years: Yes If all of the above answers are "NO", then may proceed with Cephalosporin use.     Patient Measurements: Height: 5\' 6"  (167.6 cm) Weight: 110 lb (49.9 kg) IBW/kg (Calculated) : 59.3 Heparin Dosing Weight: 50 kg  Vital Signs: Temp: 99.5 F (37.5 C) (04/23 1210) Temp Source: Oral (04/23 1210) BP: 106/62 (04/23 1400) Pulse Rate: 106 (04/23 1400)  Labs: Recent Labs    04/19/2019 1550 04/06/19 0302 04/06/19 0847  HGB 9.3* 9.4*  --   HCT 28.7* 29.8*  --   PLT 329 270  --   HEPARINUNFRC  --  0.18*  --   CREATININE 0.63  --  0.40*    Estimated Creatinine Clearance: 50.8 mL/min (A) (by C-G formula based on SCr of 0.4 mg/dL (L)).   Assessment: Patient's a 73 y.o F s/p recent fall with femur fracture and ORIF on 03/22/19, presented to the ED on 4/22 with c/o fever and hip pain.  Abd CT on 4/22 showed acute pancreatitis and nonocclusive splenic vein/portal vein thrombus. She's currently on heparin drip for thrombus.  Today, 04/06/2019: - heparin level remains subtherapeutic at 0.10. Per RN, no issues with IV line and no bleeding noted - hgb low but stable  Goal of Therapy:  Heparin level 0.3-0.7 units/ml Monitor platelets by anticoagulation protocol: Yes   Plan:  - increase heparin drip to 1150 units/hr - check 8 hr heparin level - monitor for  s/s bleeding  Nekeshia Lenhardt P 04/06/2019,2:25 PM

## 2019-04-06 NOTE — Consult Note (Signed)
Referring Provider:  Dr. Hinton Dyer Primary Care Physician:  Richmond Campbell., PA-C Primary Gastroenterologist:  None   Reason for Consultation: Pancreatitis with portal vein thrombosis  HPI: DEZIRAE SERVICE is a 73 y.o. female with a history of alcohol abuse, 3 weeks status post discharge from surgical repair of a broken hip as the consequence of a fall, prior history of chronic pancreatitis characterized by pancreatic calcifications on CT, but with acute pancreatitis related to choledocholithiasis in February 2018, necessitating laparoscopic cholecystectomy and laparoscopic common duct exploration (at which time MRCP did not show any pancreatic ductal abnormalities).  She is now admitted with urosepsis (Enterobacter species and Proteus recovered from blood, urine culture pending but multiple white cells were present in the urine).  On admission CT, done for associated abdominal pain, patient was found to have new findings (not present on CT done 2 months earlier) of nonocclusive portal vein thrombosis at the confluence of the splenic, superior mesenteric, and portal veins, as well as acute pancreatitis characterized by peripancreatic inflammatory changes and a small fluid collection consistent with a pseudocyst.    On this admission, the patient's lipase level is mildly elevated at 82, as compared to a lipase of 1700 a couple of years ago when she presented with apparent biliary pancreatitis.  Review of the patient's medical record shows that, at various times, she has shown a lipase level elevated similar to the current level of elevation.    She also has had intermittent elevation of liver chemistries.  Currently, her liver chemistries are in the 40-50 range, as compared to several weeks earlier when they were in the 15-20 range; however, review of her record shows frequent instances of mild to moderate elevation of liver chemistries, sometimes as high as 150.  Current bilirubin is  normal.  Unfortunately, the patient herself has significant decrease in mental acuity, and is more or less rambling and not able to answer questions in a coherent fashion.  She is oriented to person and place.  However, from review of the chart, it sounds as though the patient had a several day prodrome of weakness urinary symptoms, and suprapubic abdominal pain, and presented with fever, and diagnostic findings as noted above   Past Medical History:  Diagnosis Date  . Anxiety   . Current smoker   . Depression   . ETOH abuse   . GERD (gastroesophageal reflux disease)   . History of shingles summer 2015  . HTN (hypertension)   . Hyperlipidemia   . Hypertension   . Leg edema   . Neuropathy   . Neuropathy   . Pancreatitis     Past Surgical History:  Procedure Laterality Date  . ERCP N/A 02/01/2017   Procedure: ENDOSCOPIC RETROGRADE CHOLANGIOPANCREATOGRAPHY (ERCP);  Surgeon: Dorena Cookey, MD;  Location: [facility];  Service: Endoscopy;  Laterality: N/A;  . INTRAMEDULLARY (IM) NAIL INTERTROCHANTERIC Left 03/22/2019   Procedure: INTRAMEDULLARY (IM) NAIL INTERTROCHANTRIC;  Surgeon: Kathryne Hitch, MD;  Location: [facility];  Service: Orthopedics;  Laterality: Left;  . LAPAROSCOPIC CHOLECYSTECTOMY WITH COMMON DUCT EXPLORATION AND INTRAOP CHOLANGIOGRAM N/A 02/03/2017   Procedure: LAPAROSCOPIC CHOLECYSTECTOMY WITH POSSIBLE COMMON DUCT EXPLORATION AND INTRAOP CHOLANGIOGRAM;  Surgeon: Ovidio Kin, MD;  Location: [facility];  Service: General;  Laterality: N/A;  . removal of uterine fibroids  2004    Prior to Admission medications   Medication Sig Start Date End Date Taking? Authorizing Provider  amitriptyline (ELAVIL) 25 MG tablet Take 25 mg by mouth at bedtime as needed  for sleep.  03/23/19  Yes [provider]  aspirin EC 325 MG EC tablet Take 1 tablet (325 mg total) by mouth daily with breakfast. 03/24/19  Yes Pokhrel, Laxman, MD  calcium-vitamin D (OSCAL WITH D) 500-200 MG-UNIT  tablet Take 1 tablet by mouth 2 (two) times daily. 03/24/19  Yes Pokhrel, Laxman, MD  clonazePAM (KLONOPIN) 0.5 MG tablet Take 0.5 mg by mouth 2 (two) times daily as needed for anxiety. as directed 08/05/17  Yes [provider]  folic acid (FOLVITE) 1 MG tablet Take 1 tablet (1 mg total) by mouth daily. 03/24/19  Yes Pokhrel, Laxman, MD  magnesium oxide (MAG-OX) 400 (241.3 Mg) MG tablet Take 1 tablet (400 mg total) by mouth 2 (two) times daily. 03/24/19  Yes Pokhrel, Laxman, MD  methocarbamol (ROBAXIN) 500 MG tablet Take 1 tablet (500 mg total) by mouth every 6 (six) hours as needed for muscle spasms. 03/22/2019  Yes Kathryne Hitch, MD  ondansetron (ZOFRAN) 4 MG tablet Take 1 tablet (4 mg total) by mouth every 6 (six) hours as needed for nausea. 03/24/19  Yes Pokhrel, Laxman, MD  oxyCODONE-acetaminophen (PERCOCET/ROXICET) 5-325 MG tablet Take 1 tablet by mouth every 6 (six) hours as needed for severe pain. 03/16/2019  Yes Kathryne Hitch, MD  pantoprazole (PROTONIX) 40 MG tablet Take 1 tablet (40 mg total) by mouth daily for 15 days. 03/24/19 04/08/19 Yes Pokhrel, Laxman, MD  promethazine (PHENERGAN) 25 MG tablet Take 25 mg by mouth every 6 (six) hours as needed. for nausea 01/24/19  Yes [provider]  thiamine 100 MG tablet Take 1 tablet (100 mg total) by mouth daily. 03/24/19  Yes Pokhrel, Laxman, MD  feeding supplement, ENSURE ENLIVE, (ENSURE ENLIVE) LIQD Take 237 mLs by mouth 2 (two) times daily between meals. Patient not taking: Reported on 03/21/2019 07/26/18   Narda Bonds, MD  Multiple Vitamin (MULTIVITAMIN WITH MINERALS) TABS tablet Take 1 tablet by mouth daily. Patient not taking: Reported on 03/21/2019 04/13/17   Penny Pia, MD  polyethylene glycol [facility]) packet Take 17 g by mouth daily. Patient not taking: Reported on 03/21/2019 07/27/18   Narda Bonds, MD    Current Facility-Administered Medications  Medication Dose Route Frequency Provider Last Rate  Last Dose  . 0.9 %  sodium chloride infusion   Intravenous Continuous Arrien, York Ram, MD 100 mL/hr at 04/06/19 0940    . acetaminophen (TYLENOL) tablet 650 mg  650 mg Oral Q6H PRN John Giovanni, MD       Or  . acetaminophen (TYLENOL) suppository 650 mg  650 mg Rectal Q6H PRN John Giovanni, MD      . aztreonam (AZACTAM) 2 g in sodium chloride 0.9 % 100 mL IVPB  2 g Intravenous Q8H Arrien, York Ram, MD      . Chlorhexidine Gluconate Cloth 2 % PADS 6 each  6 each Topical Daily John Giovanni, MD   6 each at 04/06/19 0941  . folic acid (FOLVITE) tablet 1 mg  1 mg Oral Daily John Giovanni, MD   1 mg at 04/06/19 0931  . heparin ADULT infusion 100 units/mL (25000 units/220mL sodium chloride 0.45%)  1,000 Units/hr Intravenous Continuous John Giovanni, MD 10 mL/hr at 04/06/19 0601 1,000 Units/hr at 04/06/19 0601  . LORazepam (ATIVAN) injection 2-3 mg  2-3 mg Intravenous Q1H PRN John Giovanni, MD   2 mg at 03/24/2019 2208  . MEDLINE mouth rinse  15 mL Mouth Rinse BID John Giovanni, MD   15  mL at 04/06/19 0941  . morphine 2 MG/ML injection 1 mg  1 mg Intravenous Q3H PRN John Giovanni, MD   1 mg at 04/06/19 0954  . multivitamin with minerals tablet 1 tablet  1 tablet Oral Daily John Giovanni, MD   1 tablet at 04/06/19 0931  . nicotine (NICODERM CQ - dosed in mg/24 hours) patch 21 mg  21 mg Transdermal Daily John Giovanni, MD   21 mg at 04/06/19 0944  . ondansetron (ZOFRAN) injection 4 mg  4 mg Intravenous Q6H PRN John Giovanni, MD      . thiamine (B-1) injection 100 mg  100 mg Intravenous Daily John Giovanni, MD   100 mg at 04/06/19 0940    Allergies as of 03/15/2019 - Review Complete 04/01/2019  Allergen Reaction Noted  . Naltrexone Shortness Of Breath 04/20/2016  . Rocephin [ceftriaxone sodium in dextrose] Anaphylaxis 07/24/2018  . Penicillins Rash 08/16/2015    Family History  Problem Relation Age of Onset  . Heart disease  Father   . Neuropathy Neg Hx     Social History   Socioeconomic History  . Marital status: Widowed    Spouse name: Not on file  . Number of children: 1  . Years of education: 36  . Highest education level: Not on file  Occupational History  . Not on file  Social Needs  . Financial resource strain: Not on file  . Food insecurity:    Worry: Not on file    Inability: Not on file  . Transportation needs:    Medical: Not on file    Non-medical: Not on file  Tobacco Use  . Smoking status: Current Every Day Smoker    Packs/day: 1.00    Types: Cigarettes  . Smokeless tobacco: Never Used  Substance and Sexual Activity  . Alcohol use: Yes    Alcohol/week: 14.0 standard drinks    Types: 14 Shots of liquor per week    Comment: states., "I keep a glass with me", reports drinking today (04/09/17)  . Drug use: No  . Sexual activity: Not Currently  Lifestyle  . Physical activity:    Days per week: Not on file    Minutes per session: Not on file  . Stress: Not on file  Relationships  . Social connections:    Talks on phone: Not on file    Gets together: Not on file    Attends religious service: Not on file    Active member of club or organization: Not on file    Attends meetings of clubs or organizations: Not on file    Relationship status: Not on file  . Intimate partner violence:    Fear of current or ex partner: Not on file    Emotionally abused: Not on file    Physically abused: Not on file    Forced sexual activity: Not on file  Other Topics Concern  . Not on file  Social History Narrative   Lives at home with daughter Yvonne Kendall   Caffeine use: Drinks coffee 1/day    Review of Systems: Not really obtainable from the patient because of altered mental status.  Review of the record indicates that she still smokes and drinks actively, but did not apparently have any respiratory complaints prior to admission.  Physical Exam: Vital signs in last 24 hours: Temp:  [97.5 F (36.4  C)-101.9 F (38.8 C)] 97.7 F (36.5 C) (04/23 0817) Pulse Rate:  [79-124] 108 (04/23 1000) Resp:  [16-30] 23 (04/23  1000) BP: (91-145)/(49-105) 110/71 (04/23 1000) SpO2:  [79 %-100 %] 100 % (04/23 1000) Weight:  [49.9 kg] 49.9 kg (04/22 1528)   General: Sitting in bed in no acute distress, slightly low blood pressure and rapid heart rate, no respiratory distress, alert but not completely coherent, appears very thin and frail but not cachectic Head:  Normocephalic and atraumatic. Eyes:  Sclera clear, no icterus.  Mouth:   No ulcerations or lesions.  Oropharynx shows dry mucous membranes Lungs:  Clear throughout to auscultation.   No wheezes, crackles, or rhonchi. No evident respiratory distress. Heart:   Slightly rapid rate but regular rhythm; no murmurs, clicks, rubs,  or gallops. Abdomen: Bowel sounds present.  Slight distention in the left lower quadrant wher is associated tympany and some mild to moderate subjective tenderness to palpation.  No mass-effect, no guarding or peritoneal findings.  Interestingly, no epigase theretric tenderness despite radiographically confirmed pancreatitis. Msk:   Symmetrical without gross deformities. Extremities:   1+ tibial edema Neurologic: Diminished mental status as noted above, but no obvious focal deficits Skin:  Intact without significant lesions or rashes. Psych:   Alert and cooperative but somewhat incoherent. Normal mood and affect.  Intake/Output from previous day: 04/22 0701 - 04/23 0700 In: 3904.2 [P.O.:120; I.V.:1890; IV Piggyback:1894.1] Out: 275 [Urine:275] Intake/Output this shift: No intake/output data recorded.  Lab Results: Recent Labs    03/22/2019 1550 04/06/19 0302  WBC 19.5* 14.0*  HGB 9.3* 9.4*  HCT 28.7* 29.8*  PLT 329 270   BMET Recent Labs    03/26/2019 1550 04/06/19 0847  NA 132* 139  K 3.0* 4.9  CL 100 113*  CO2 21* 17*  GLUCOSE 277* 165*  BUN 32* 20  CREATININE 0.63 0.40*  CALCIUM 7.8* 7.5*    LFT Recent Labs    04/06/19 0302  PROT 5.0*  ALBUMIN 2.2*  AST 33  ALT 29  ALKPHOS 200*  BILITOT 0.5  BILIDIR 0.2  IBILI 0.3   PT/INR No results for input(s): LABPROT, INR in the last 72 hours.  Studies/Results: Ct Abdomen Pelvis W Contrast  Addendum Date: 03/30/2019   ADDENDUM REPORT: 04/11/2019 20:04 ADDENDUM: Addendum created after discussing role of interventional radiology with Dr. Anitra Lauth. In addition to the previously noted findings, there is also nonocclusive splenic vein/portal vein thrombus at the confluence of splenic vein, superior mesenteric vein, and the portal vein. This is likely a consequence of the pancreatitis/inflammation. These results were discussed by telephone at the time of interpretation on 03/28/2019 at 8:04 pm with Dr. Gwyneth Sprout Electronically Signed   By: Gilmer Mor D.O.   On: 04/07/2019 20:04   Result Date: 03/24/2019 CLINICAL DATA:  Abdominal pain, fever EXAM: CT ABDOMEN AND PELVIS WITH CONTRAST TECHNIQUE: Multidetector CT imaging of the abdomen and pelvis was performed using the standard protocol following bolus administration of intravenous contrast. CONTRAST:  6mL OMNIPAQUE IOHEXOL 300 MG/ML  SOLN COMPARISON:  CT abdomen/pelvis 01/31/2019 FINDINGS: Lower chest: No acute abnormality. Hepatobiliary: No focal liver abnormality is seen. Status post cholecystectomy. No biliary dilatation. Pancreas: Calcifications in the pancreas consistent with history of chronic pancreatitis. Peripancreatic inflammatory changes around the mid body and tail most concerning for acute pancreatitis. 2.1 cm hypodense fluid collection adjacent to the pancreatic tail and posterior fundus of the stomach concerning for a pseudocyst. Spleen: Choose Adrenals/Urinary Tract: Adrenal glands are unremarkable. Kidneys are normal, without renal calculi, focal lesion, or hydronephrosis. Bladder is unremarkable. Stomach/Bowel: Stomach is within normal limits. Appendix appears normal. No  evidence  of bowel wall thickening, distention, or inflammatory changes. Diverticulosis without evidence of diverticulitis. Vascular/Lymphatic: Abdominal aortic atherosclerosis. No lymphadenopathy. Reproductive: Uterine fibroid.  No other adnexal mass. Other: No abdominal wall hernia or abnormality. No abdominopelvic ascites. Musculoskeletal: No acute osseous abnormality. No aggressive osseous lesion. Degenerative disc disease with disc height loss at L5-S1 with bilateral facet arthropathy. IMPRESSION: 1. Acute pancreatitis involving the body and tail the pancreas. 2.1 cm hypodense fluid collection adjacent to the pancreatic tail and posterior fundus of the stomach concerning for a pseudocyst which appears to be within the posterior wall of the stomach. 2. Diverticulosis without evidence of diverticulitis. 3.  Aortic Atherosclerosis (ICD10-I70.0). Electronically Signed: By: Elige KoHetal  Patel On: 2019/06/09 17:33   Dg Chest Port 1 View  Result Date: Jul 11, 2019 CLINICAL DATA:  73 year old female with a history of left hip pain EXAM: PORTABLE CHEST 1 VIEW COMPARISON:  03/21/2019, 07/24/2018 FINDINGS: Cardiomediastinal silhouette unchanged in size and contour. No evidence of central vascular congestion. No interlobular septal thickening. No pneumothorax. No pleural effusion. Coarsened interstitial markings are similar to the comparison. No confluent airspace disease. No displaced fracture. IMPRESSION: Chronic changes without evidence of acute cardiopulmonary disease Electronically Signed   By: Gilmer MorJaime  Wagner D.O.   On: 2019/06/09 15:29   Dg Hip Unilat With Pelvis 2-3 Views Right  Result Date: Jul 11, 2019 CLINICAL DATA:  73 year old female with pain. Prior left hip fracture fixation 03/22/2019 EXAM: DG HIP (WITH OR WITHOUT PELVIS) 2-3V RIGHT COMPARISON:  CT today FINDINGS: Retained contrast within the urinary bladder, distended. Formed stool within the rectum. Purewick urine collection system. Bony pelvic ring appears  intact with no acute displaced fracture. Right hip projects normally over the acetabula with no proximal right femoral fracture identified. Surgical changes of left hip repair with no significant callus formation at the site of recent hip fracture. Vascular calcifications. IMPRESSION: Negative for acute bony abnormality. Urinary bladder with excreted contrast, partially distended. Formed stool within the rectal vault. Atherosclerosis. Electronically Signed   By: Gilmer MorJaime  Wagner D.O.   On: 2019/06/09 21:15    Impression: 1.  Urosepsis, under treatment with antibiotics 2.  Recent (i.e., not present 2 months ago) nonocclusive portal vein thrombosis, possibly a consequence of associated pancreatitis 3,  Radiographically evident pancreatitis, although exam and serum lipase level do not suggest clinically significant pancreatitis. 4.  Chronic calcific pancreatitis in the setting of chronic alcohol use disorder  Discussion:   the differential diagnosis would include pylephlebitis (septic phlebitis of the portal vein) but coincidental concurrent urosepsis and portal vein thrombosis seem to fit the clinical picture better  Plan: 1.  For portal vein thrombosis, agree with anticoagulation.  Would continue for approximately 3 months.  This should help prevent propagation of the portal vein thrombosis, especially until the pancreatitis subsides.  Given the patient's liver disease and alcohol use disorder, consideration might be given to DOAC'S rather than warfarin.  The patient may benefit from supervision of her medication, either in some sort of assisted living facility or via home health visits  2.  For pancreatitis, would consider gradual advancement of diet starting empirically in a couple of days.  Since the patient does not seem to be clearly symptomatic from her pancreatitis, and since her lipase is only minimally elevated, it will be difficult to know when the optimal time to advance the diet is.  3.  For  the urosepsis, I have added Saccharomyces boulardii probiotic therapy to her regimen to help prevent antibiotic associated diarrhea and/or C. difficile colitis as  a complication of in-hospital use of antibiotics.  Will follow with you at a distance.  Please call me at any time if earlier input is desired.   LOS: 1 day   Katy Fitch Deniz Eskridge  04/06/2019, 11:03 AM   Pager [phone] If no answer or after 5 PM call [phone]

## 2019-04-06 NOTE — Telephone Encounter (Signed)
We would still need to see her at some point in the office for x-rays of her hip.

## 2019-04-07 ENCOUNTER — Inpatient Hospital Stay (HOSPITAL_COMMUNITY): Payer: PPO

## 2019-04-07 DIAGNOSIS — I361 Nonrheumatic tricuspid (valve) insufficiency: Secondary | ICD-10-CM

## 2019-04-07 LAB — BASIC METABOLIC PANEL
Anion gap: 6 (ref 5–15)
BUN: 16 mg/dL (ref 8–23)
CO2: 19 mmol/L — ABNORMAL LOW (ref 22–32)
Calcium: 7.2 mg/dL — ABNORMAL LOW (ref 8.9–10.3)
Chloride: 116 mmol/L — ABNORMAL HIGH (ref 98–111)
Creatinine, Ser: 0.33 mg/dL — ABNORMAL LOW (ref 0.44–1.00)
GFR calc Af Amer: >60 mL/min (ref >60)
GFR calc non Af Amer: >60 mL/min (ref >60)
Glucose, Bld: 126 mg/dL — ABNORMAL HIGH (ref 70–99)
Potassium: 3 mmol/L — ABNORMAL LOW (ref 3.5–5.1)
Sodium: 141 mmol/L (ref 135–145)

## 2019-04-07 LAB — LIPASE, BLOOD: Lipase: 82 U/L — ABNORMAL HIGH (ref 11–51)

## 2019-04-07 LAB — CBC
HCT: 26.6 % — ABNORMAL LOW (ref 36.0–46.0)
Hemoglobin: 8.5 g/dL — ABNORMAL LOW (ref 12.0–15.0)
MCH: 35.6 pg — ABNORMAL HIGH (ref 26.0–34.0)
MCHC: 32 g/dL (ref 30.0–36.0)
MCV: 111.3 fL — ABNORMAL HIGH (ref 80.0–100.0)
Platelets: 183 10*3/uL (ref 150–400)
RBC: 2.39 MIL/uL — ABNORMAL LOW (ref 3.87–5.11)
RDW: 15.4 % (ref 11.5–15.5)
WBC: 8 10*3/uL (ref 4.0–10.5)
nRBC: 0 % (ref 0.0–0.2)

## 2019-04-07 LAB — HEPARIN LEVEL (UNFRACTIONATED): Heparin Unfractionated: 0.39 IU/mL (ref 0.30–0.70)

## 2019-04-07 LAB — HEPATIC FUNCTION PANEL
ALT: 21 U/L (ref 0–44)
AST: 23 U/L (ref 15–41)
Albumin: 1.6 g/dL — ABNORMAL LOW (ref 3.5–5.0)
Alkaline Phosphatase: 152 U/L — ABNORMAL HIGH (ref 38–126)
Bilirubin, Direct: 0.2 mg/dL (ref 0.0–0.2)
Indirect Bilirubin: 0.3 mg/dL (ref 0.3–0.9)
Total Bilirubin: 0.5 mg/dL (ref 0.3–1.2)
Total Protein: 4 g/dL — ABNORMAL LOW (ref 6.5–8.1)

## 2019-04-07 LAB — ECHOCARDIOGRAM COMPLETE
Height: 66 in
Weight: 1760 oz

## 2019-04-07 MED ORDER — FUROSEMIDE 10 MG/ML IJ SOLN
20.0000 mg | Freq: Once | INTRAMUSCULAR | Status: AC
  Administered 2019-04-07: 20 mg via INTRAVENOUS
  Filled 2019-04-07: qty 2

## 2019-04-07 MED ORDER — METOPROLOL TARTRATE 5 MG/5ML IV SOLN
2.5000 mg | INTRAVENOUS | Status: DC | PRN
  Administered 2019-04-08: 2.5 mg via INTRAVENOUS
  Filled 2019-04-07: qty 5

## 2019-04-07 MED ORDER — APIXABAN 5 MG PO TABS: 5.0000 mg | ORAL_TABLET | Freq: Two times a day (BID) | ORAL | Status: DC

## 2019-04-07 MED ORDER — CIPROFLOXACIN IN D5W 200 MG/100ML IV SOLN: 200.0000 mg | Freq: Two times a day (BID) | INTRAVENOUS | Status: DC

## 2019-04-07 MED ORDER — FUROSEMIDE 10 MG/ML IJ SOLN
INTRAMUSCULAR | Status: AC
  Administered 2019-04-07: 40 mg via INTRAVENOUS
  Filled 2019-04-07: qty 4

## 2019-04-07 MED ORDER — CLONAZEPAM 0.5 MG PO TBDP
0.5000 mg | ORAL_TABLET | Freq: Two times a day (BID) | ORAL | Status: DC
  Administered 2019-04-07 (×2): 0.5 mg via ORAL
  Filled 2019-04-07 (×3): qty 1

## 2019-04-07 MED ORDER — APIXABAN 5 MG PO TABS
10.0000 mg | ORAL_TABLET | Freq: Two times a day (BID) | ORAL | Status: DC
  Administered 2019-04-07 (×2): 10 mg via ORAL
  Filled 2019-04-07 (×5): qty 2

## 2019-04-07 MED ORDER — POTASSIUM CHLORIDE CRYS ER 20 MEQ PO TBCR
40.0000 meq | EXTENDED_RELEASE_TABLET | ORAL | Status: AC
  Administered 2019-04-07 (×2): 40 meq via ORAL
  Filled 2019-04-07 (×2): qty 2

## 2019-04-07 MED ORDER — FUROSEMIDE 10 MG/ML IJ SOLN
40.0000 mg | Freq: Once | INTRAMUSCULAR | Status: AC
  Administered 2019-04-07: 40 mg via INTRAVENOUS

## 2019-04-07 MED ORDER — SODIUM CHLORIDE 0.9 % IV SOLN
2.0000 g | Freq: Three times a day (TID) | INTRAVENOUS | Status: DC
  Administered 2019-04-07 – 2019-04-08 (×3): 2 g via INTRAVENOUS
  Filled 2019-04-07 (×4): qty 2

## 2019-04-07 MED ORDER — PERFLUTREN LIPID MICROSPHERE
1.0000 mL | INTRAVENOUS | Status: AC | PRN
  Administered 2019-04-07: 4 mL via INTRAVENOUS
  Filled 2019-04-07: qty 10

## 2019-04-07 MED ORDER — SODIUM CHLORIDE 0.9 % IV BOLUS
1000.0000 mL | Freq: Once | INTRAVENOUS | Status: AC
  Administered 2019-04-07: 1000 mL via INTRAVENOUS

## 2019-04-07 MED ORDER — AMITRIPTYLINE HCL 25 MG PO TABS
25.0000 mg | ORAL_TABLET | Freq: Every day | ORAL | Status: DC
  Administered 2019-04-08 – 2019-04-21 (×14): 25 mg via ORAL
  Filled 2019-04-07 (×16): qty 1

## 2019-04-07 NOTE — Progress Notes (Signed)
Pt received 1000 ml bolus at 500 ml/hr.  BP 82/52 with map of 60.  Dr. Rana Snare notified.

## 2019-04-07 NOTE — Progress Notes (Signed)
Patient somewhat more alert and coherent, but still rather "distant" affect.  No complaint of abdominal pain, perhaps some slight nausea.  Exam unchanged from yesterday, which is to say, very active bowel sounds, no epigastric tenderness, some distention/tympany/mild tenderness in left lower quadrant.  LFT's remain essent nl (alk phos improved), lipase stable at 82, WBC impoved and now nl.   RECOMM:  1.  Agree w/ plan for transition to soft diet 2.  Agree w/ plan for transition to Apixiban--would recomm 3-6 mos of anticoagulation unless pt has complications from it 3.  Would continue FloraStor probiotic for 2 wks after completion of antbx treatment 4.  Will sign off, but please call us if further input is desired.   V. , M.D. Pager [phone] If no answer or after 5 PM call [phone]   

## 2019-04-07 NOTE — Progress Notes (Signed)
PROGRESS NOTE    April [name]  ZOX:[id number] DOB: 11-04-1946 DOA: 04-10-2019 PCP: Richmond Campbell., PA-C    Brief Narrative:  73 year old female who presented with right hip pain and urinary tract symptoms.  She does have significant past medical history for hypertension, dyslipidemia, pancreatitis, alcohol abuse, depression, anxiety and GERD.  She had a recent left intratrochanteric femur fracture status post intramedullary nail placement March 22, 2019.  Patient had several days of right sided abdominal pain, complicated with fever, confusion and severe anorexia.  She had generalized weakness, dysuria, urinary frequency and urgency.  Apparently patient continued drinks alcohol daily.  On her initial physical examination she was febrile 101.9 F, her blood pressure was 91/56, heart rate 93, respiratory rate 23, oxygen saturation 93%, she had dry mucous membranes, her lungs were clear to auscultation bilaterally, heart S1-S2 present and rhythmic, tachycardic, abdomen with positive tenderness and guarding, mainly at the suprapubic region and right lower quadrant, no lower extremity edema.  Sodium 132, potassium 3.0, chloride 100, bicarb 21, glucose 277, BUN 32, creatinine 0.63, lipase 82, AST 51, ALT 37, white count 19.5, hemoglobin 9.3, hematocrit 28.7, platelets 329, urinalysis had more than 50 white cells, her chest radiograph had left rotation, positive hyperinflation, no infiltrates.  Abdominal CT with acute pancreatitis involving the body and tail of the pancreas.  2.1 cm hypodense fluid collection at TS into the pancreatic tail and posterior fundus of the stomach concerning for pseudocyst.  Nonocclusive splenic vein/portal vein thrombosis at the confluence of the splenic vein, superior mesenteric vein and portal vein.  EKG 115 bpm, with left axis deviation, normal intervals, sinus rhythm with below complexes, no significant ST segment or T wave changes.  Patient was admitted to the hospital  with a working diagnosis of sepsis due to acute pancreatitis and urinary tract infection   Assessment & Plan:   Principal Problem:   Sepsis (HCC) Active Problems:   Acute pancreatitis   UTI (urinary tract infection)   Pancreatic pseudocyst   Portal vein thrombosis    1. Sepsis due to urinary tract infection (present on admission)/ Proteus bacteremia (gram negative sepsis). Patient had episodes of hypotension that improved with bolus of NS, this am with signs of volume overload and has received 20 mg of IV furosemide. Blood culture positive for proteus, patient allergic to cephalosporins, will continue antibiotic therapy with aztreonam IV (local resistance to ciprofloxacin 1/3), follow on sensitivities. Hold on further IV fluids and will check echocardiogram. Will target a MAP of 60. Urine output is 900 ml over last 24 H, wbc down to 8,0, and patient has remained afebrile.   2. Acute pancreatitis complicated with pseudocyst and portal vein thrombosis. Will start patient on apixaban for anticoagulation, advance diet to soft. Continue as needed IV analgesics and IV antiemetics.   3. Alcohol abuse with potential for withdrawal. Continue benzodiazepine per protocol CIWA. At home patient on clonzapeam that will be resumed at 0.5 mg bid.   4. Hypokalemia, hyponatremia, hyperchloremic non gap metabolic acidosis. Improved Na at 141, with K at 3,0 and Cl at 116 with serum bicarbonate at 19. Discontinue IV fluids, will use bolus of LR if needed for hypotension. Renal function preserved with serum cr at 0.33. Order 80 meq Kcl in 2 divided doses, PO.   5. Metabolic encephalopathy. Clinically improved, patient is more awake and alert, will resume clonazepam and amitriptyline.   DVT prophylaxis: apixaban  Code Status:  full Family Communication:  I spoke with patient's daughter yesterday  over the phone for updates and all questions were addressed.   Disposition Plan/ discharge barriers:  Pending  clinical improvement  Body mass index is 17.75 kg/m. Malnutrition Type:      Malnutrition Characteristics:      Nutrition Interventions:     RN Pressure Injury Documentation:     Consultants:   GI   Procedures:     Antimicrobials:   IV ciprofloxacin     Subjective: Patient this am is more awake and alert, continue to have moderate abdominal pain and nausea, no   Objective: Vitals:   04/07/19 0200 04/07/19 0400 04/07/19 0403 04/07/19 0600  BP: (!) 90/59 (!) 84/50 (!) 82/52 130/65  Pulse: 94 95 95 (!) 121  Resp: (!) 22 (!) 23 (!) 24 (!) 24  Temp:  98 F (36.7 C)    TempSrc:  Oral    SpO2: 100%  100% 92%  Weight:      Height:        Intake/Output Summary (Last 24 hours) at 04/07/2019 0744 Last data filed at 04/07/2019 0600 Gross per 24 hour  Intake 4977.21 ml  Output 900 ml  Net 4077.21 ml   Filed Weights   04/04/2019 1528  Weight: 49.9 kg    Examination:   General: deconditioned and ill looking appearing Neurology: Awake and alert, non focal  E ENT: mild pallor, no icterus, oral mucosa moist Cardiovascular: No JVD. S1-S2 present, rhythmic, no gallops, rubs, or murmurs. +/++ pitting lower extremity edema. Pulmonary: positive breath sounds bilaterally, adequate air movement, no wheezing or rhonchi, scattered rales. Gastrointestinal. Abdomen with no organomegaly, positive tender at the epigastrium, but no rebound or guarding Skin. No rashes Musculoskeletal: no joint deformities     Data Reviewed: I have personally reviewed following labs and imaging studies  CBC: Recent Labs  Lab 04/03/2019 1550 04/06/19 0302 04/07/19 0252  WBC 19.5* 14.0* 8.0  NEUTROABS 18.0*  --   --   HGB 9.3* 9.4* 8.5*  HCT 28.7* 29.8* 26.6*  MCV 110.4* 112.0* 111.3*  PLT 329 270 183   Basic Metabolic Panel: Recent Labs  Lab 03/26/2019 1513 03/17/2019 1550 04/06/19 0847 04/07/19 0252  NA  --  132* 139 141  K  --  3.0* 4.9 3.0*  CL  --  100 113* 116*  CO2  --   21* 17* 19*  GLUCOSE  --  277* 165* 126*  BUN  --  32* 20 16  CREATININE  --  0.63 0.40* 0.33*  CALCIUM  --  7.8* 7.5* 7.2*  MG 2.2  --   --   --    GFR: Estimated Creatinine Clearance: 50.8 mL/min (A) (by C-G formula based on SCr of 0.33 mg/dL (L)). Liver Function Tests: Recent Labs  Lab 04/09/2019 1550 04/06/19 0302 04/07/19 0252  AST 51* 33 23  ALT 37 29 21  ALKPHOS 215* 200* 152*  BILITOT 0.5 0.5 0.5  PROT 5.3* 5.0* 4.0*  ALBUMIN 2.3* 2.2* 1.6*   Recent Labs  Lab 04/04/2019 1550 04/07/19 0252  LIPASE 82* 82*   No results for input(s): AMMONIA in the last 168 hours. Coagulation Profile: No results for input(s): INR, PROTIME in the last 168 hours. Cardiac Enzymes: No results for input(s): CKTOTAL, CKMB, CKMBINDEX, TROPONINI in the last 168 hours. BNP (last 3 results) No results for input(s): PROBNP in the last 8760 hours. HbA1C: Recent Labs    04/06/19 0302  HGBA1C 5.9*   CBG: Recent Labs  Lab 03/30/2019 2136 04/06/19 0807 04/06/19  1123 04/06/19 1622 04/06/19 2135  GLUCAP 149* 129* 148* 139* 146*   Lipid Profile: No results for input(s): CHOL, HDL, LDLCALC, TRIG, CHOLHDL, LDLDIRECT in the last 72 hours. Thyroid Function Tests: No results for input(s): TSH, T4TOTAL, FREET4, T3FREE, THYROIDAB in the last 72 hours. Anemia Panel: Recent Labs    04/06/19 0302  VITAMINB12 2,035*  FOLATE 21.9  FERRITIN 42  TIBC 136*  IRON 8*  RETICCTPCT 3.1      Radiology Studies: I have reviewed all of the imaging during this hospital visit personally     Scheduled Meds: . Chlorhexidine Gluconate Cloth  6 each Topical Daily  . folic acid  1 mg Oral Daily  . mouth rinse  15 mL Mouth Rinse BID  . multivitamin with minerals  1 tablet Oral Daily  . nicotine  21 mg Transdermal Daily  . saccharomyces boulardii  250 mg Oral BID  . thiamine injection  100 mg Intravenous Daily   Continuous Infusions: . sodium chloride 100 mL/hr at 04/06/19 1951  . aztreonam Stopped  (04/07/19 0211)  . heparin 1,150 Units/hr (04/06/19 1856)     LOS: 2 days        Ashaunte Standley Annett Gulaaniel Keene Gilkey, MD

## 2019-04-07 NOTE — Progress Notes (Signed)
ANTICOAGULATION CONSULT NOTE - Follow Up Consult  Pharmacy Consult for Heparin Indication: portal vein thrombosis  Allergies  Allergen Reactions  . Naltrexone Shortness Of Breath  . Rocephin [Ceftriaxone Sodium In Dextrose] Anaphylaxis  . Penicillins Rash    Has patient had a PCN reaction causing immediate rash, facial/tongue/throat swelling, SOB or lightheadedness with hypotension: Yes immediate rash  Has patient had a PCN reaction causing severe rash involving mucus membranes or skin necrosis: Yes Has patient had a PCN reaction that required hospitalization": No Has patient had a PCN reaction occurring within the last 10 years: Yes If all of the above answers are "NO", then may proceed with Cephalosporin use.     Patient Measurements: Height: 5\' 6"  (167.6 cm) Weight: 110 lb (49.9 kg) IBW/kg (Calculated) : 59.3 Heparin Dosing Weight:   Vital Signs: Temp: 97.5 F (36.4 C) (04/24 0000) Temp Source: Oral (04/24 0000) BP: 82/52 (04/24 0403) Pulse Rate: 95 (04/24 0403)  Labs: Recent Labs    04/04/2019 1550 04/06/19 0302 04/06/19 0847 04/06/19 1342 04/06/19 2318 04/07/19 0252  HGB 9.3* 9.4*  --   --   --  8.5*  HCT 28.7* 29.8*  --   --   --  26.6*  PLT 329 270  --   --   --  183  HEPARINUNFRC  --  0.18*  --  0.10* 0.31  --   CREATININE 0.63  --  0.40*  --   --  0.33*    Estimated Creatinine Clearance: 50.8 mL/min (A) (by C-G formula based on SCr of 0.33 mg/dL (L)).   Medications:  Infusions:  . sodium chloride 100 mL/hr at 04/06/19 1951  . aztreonam Stopped (04/07/19 0211)  . heparin 1,150 Units/hr (04/06/19 1856)    Assessment: Heparin level at goal, due to age expect heparin level to trend higher.  No heparin issues noted.  Goal of Therapy:  Heparin level 0.3-0.7 units/ml Monitor platelets by anticoagulation protocol: Yes   Plan:  Continue heparin drip at current rate Recheck level at 0800  Darlina Guys, Lapel Crowford 04/07/2019,4:47 AM

## 2019-04-07 NOTE — Progress Notes (Signed)
Pt BP 82/56 with map of 63, rechecked BP  84/53 with map 61.  Dr. Rana Snare notified.  Order received to give NS 1000 ml at 500 ml/hr.

## 2019-04-07 NOTE — Progress Notes (Signed)
Prn Metoprolol ordered for hr >130.

## 2019-04-07 NOTE — Progress Notes (Signed)
Pt has developed coarse crackles bilaterally, also has non-productive cough. BP 130/65.  IVF stopped and MD notified. Also notified that potassium level 3.0 this a.m.  Awaiting any new orders.

## 2019-04-07 NOTE — Progress Notes (Signed)
ANTICOAGULATION CONSULT NOTE  Pharmacy Consult for Apixaban Indication: portal vein thrombosis  Allergies  Allergen Reactions  . Naltrexone Shortness Of Breath  . Rocephin [Ceftriaxone Sodium In Dextrose] Anaphylaxis  . Penicillins Rash    Has patient had a PCN reaction causing immediate rash, facial/tongue/throat swelling, SOB or lightheadedness with hypotension: Yes immediate rash  Has patient had a PCN reaction causing severe rash involving mucus membranes or skin necrosis: Yes Has patient had a PCN reaction that required hospitalization": No Has patient had a PCN reaction occurring within the last 10 years: Yes If all of the above answers are "NO", then may proceed with Cephalosporin use.    Patient Measurements: Height: 5\' 6"  (167.6 cm) Weight: 110 lb (49.9 kg) IBW/kg (Calculated) : 59.3 Heparin Dosing Weight: 50 kg  Vital Signs: Temp: 98 F (36.7 C) (04/24 0400) Temp Source: Oral (04/24 0400) BP: 130/65 (04/24 0600) Pulse Rate: 121 (04/24 0600)  Labs: Recent Labs    03/31/2019 1550 04/06/19 0302 04/06/19 0847 04/06/19 1342 04/06/19 2318 04/07/19 0252  HGB 9.3* 9.4*  --   --   --  8.5*  HCT 28.7* 29.8*  --   --   --  26.6*  PLT 329 270  --   --   --  183  HEPARINUNFRC  --  0.18*  --  0.10* 0.31  --   CREATININE 0.63  --  0.40*  --   --  0.33*   Estimated Creatinine Clearance: 50.8 mL/min (A) (by C-G formula based on SCr of 0.33 mg/dL (L)).  Assessment: 73 y.o F s/p recent fall with femur fracture and ORIF on 03/22/19, presented to the ED on 4/22 with c/o fever and hip pain.  Abd CT on 4/22 showed acute pancreatitis and nonocclusive splenic vein/portal vein thrombus - started Heparin 4/22 pm.  Today, 04/07/2019: Transition to Apixaban, discontinue Heparin  Goal of Therapy:  Heparin level 0.3-0.7 units/ml Monitor platelets by anticoagulation protocol: Yes   Plan:  - Discontinue Heparin infusion at 10am - Begin Apixaban 10mg  bid x 7 days (begin 10a), followed by  5mg  bid  - Apixaban education  - Monitor CBC, s/s bleed  Chilton Si, Jalila Goodnough L 04/07/2019,8:04 AM

## 2019-04-07 NOTE — Progress Notes (Signed)
  Echocardiogram 2D Echocardiogram with definity has been performed.  Leta Jungling M 04/07/2019, 2:54 PM

## 2019-04-07 NOTE — Telephone Encounter (Signed)
Daughter is wondering if she can just get the postop xrays while she is in the hospital versus coming here

## 2019-04-07 NOTE — Progress Notes (Signed)
MD Arrien notified and aware of patient's hr on 135 bpm. No new orders at this time. Pt b/p is stable at 96/68 mmhg.

## 2019-04-08 ENCOUNTER — Inpatient Hospital Stay (HOSPITAL_COMMUNITY): Payer: PPO

## 2019-04-08 DIAGNOSIS — I5021 Acute systolic (congestive) heart failure: Secondary | ICD-10-CM

## 2019-04-08 LAB — CBC WITH DIFFERENTIAL/PLATELET
Abs Immature Granulocytes: 0.24 10*3/uL — ABNORMAL HIGH (ref 0.00–0.07)
Basophils Absolute: 0.1 10*3/uL (ref 0.0–0.1)
Basophils Relative: 0 %
Eosinophils Absolute: 0 10*3/uL (ref 0.0–0.5)
Eosinophils Relative: 0 %
HCT: 35 % — ABNORMAL LOW (ref 36.0–46.0)
Hemoglobin: 11.7 g/dL — ABNORMAL LOW (ref 12.0–15.0)
Immature Granulocytes: 2 %
Lymphocytes Relative: 7 %
Lymphs Abs: 1.1 10*3/uL (ref 0.7–4.0)
MCH: 35.6 pg — ABNORMAL HIGH (ref 26.0–34.0)
MCHC: 33.4 g/dL (ref 30.0–36.0)
MCV: 106.4 fL — ABNORMAL HIGH (ref 80.0–100.0)
Monocytes Absolute: 1.1 10*3/uL — ABNORMAL HIGH (ref 0.1–1.0)
Monocytes Relative: 7 %
Neutro Abs: 13 10*3/uL — ABNORMAL HIGH (ref 1.7–7.7)
Neutrophils Relative %: 84 %
Platelets: 200 10*3/uL (ref 150–400)
RBC: 3.29 MIL/uL — ABNORMAL LOW (ref 3.87–5.11)
RDW: 15.5 % (ref 11.5–15.5)
WBC: 15.4 10*3/uL — ABNORMAL HIGH (ref 4.0–10.5)
nRBC: 0.2 % (ref 0.0–0.2)

## 2019-04-08 LAB — CULTURE, BLOOD (ROUTINE X 2)
Special Requests: ADEQUATE
Special Requests: ADEQUATE

## 2019-04-08 LAB — BLOOD GAS, ARTERIAL
Acid-Base Excess: 1.1 mmol/L (ref 0.0–2.0)
Acid-base deficit: 1 mmol/L (ref 0.0–2.0)
Bicarbonate: 24 mmol/L (ref 20.0–28.0)
Bicarbonate: 25.4 mmol/L (ref 20.0–28.0)
Drawn by: 317771
Drawn by: 317771
O2 Content: 4 L/min
O2 Content: 4 L/min
O2 Saturation: 94.8 %
O2 Saturation: 89.8 %
Patient temperature: 101
Patient temperature: 98
pCO2 arterial: 47.2 mmHg (ref 32.0–48.0)
pCO2 arterial: 40.6 mmHg (ref 32.0–48.0)
pH, Arterial: 7.334 — ABNORMAL LOW (ref 7.350–7.450)
pH, Arterial: 7.411 (ref 7.350–7.450)
pO2, Arterial: 85.5 mmHg (ref 83.0–108.0)
pO2, Arterial: 58.4 mmHg — ABNORMAL LOW (ref 83.0–108.0)

## 2019-04-08 LAB — BASIC METABOLIC PANEL
Anion gap: 9 (ref 5–15)
BUN: 14 mg/dL (ref 8–23)
CO2: 20 mmol/L — ABNORMAL LOW (ref 22–32)
Calcium: 8 mg/dL — ABNORMAL LOW (ref 8.9–10.3)
Chloride: 109 mmol/L (ref 98–111)
Creatinine, Ser: 0.46 mg/dL (ref 0.44–1.00)
GFR calc Af Amer: >60 mL/min (ref >60)
GFR calc non Af Amer: >60 mL/min (ref >60)
Glucose, Bld: 162 mg/dL — ABNORMAL HIGH (ref 70–99)
Potassium: 4.4 mmol/L (ref 3.5–5.1)
Sodium: 138 mmol/L (ref 135–145)

## 2019-04-08 LAB — GLUCOSE, CAPILLARY: Glucose-Capillary: 136 mg/dL — ABNORMAL HIGH (ref 70–99)

## 2019-04-08 MED ORDER — LORAZEPAM 0.5 MG PO TABS: 0.5000 mg | ORAL_TABLET | ORAL | Status: DC | PRN

## 2019-04-08 MED ORDER — FUROSEMIDE 10 MG/ML IJ SOLN
40.0000 mg | Freq: Once | INTRAMUSCULAR | Status: AC
  Administered 2019-04-08: 40 mg via INTRAVENOUS
  Filled 2019-04-08: qty 4

## 2019-04-08 MED ORDER — LORAZEPAM 2 MG/ML IJ SOLN: 0.5000 mg | INTRAMUSCULAR | Status: DC | PRN

## 2019-04-08 MED ORDER — METOPROLOL TARTRATE 25 MG PO TABS
50.0000 mg | ORAL_TABLET | Freq: Two times a day (BID) | ORAL | Status: DC
  Filled 2019-04-08 (×2): qty 2

## 2019-04-08 MED ORDER — CIPROFLOXACIN IN D5W 200 MG/100ML IV SOLN
200.0000 mg | Freq: Two times a day (BID) | INTRAVENOUS | Status: DC
  Filled 2019-04-08: qty 100

## 2019-04-08 MED ORDER — CIPROFLOXACIN IN D5W 400 MG/200ML IV SOLN
400.0000 mg | Freq: Two times a day (BID) | INTRAVENOUS | Status: DC
  Administered 2019-04-08 – 2019-04-11 (×8): 400 mg via INTRAVENOUS
  Filled 2019-04-08 (×8): qty 200

## 2019-04-08 NOTE — Discharge Instructions (Addendum)
Information on my medicine - ELIQUIS (apixaban)  This medication education was reviewed with me or my healthcare representative as part of my discharge preparation.  The pharmacist that spoke with me during my hospital stay was:   Why was Eliquis prescribed for you? Eliquis was prescribed to treat blood clots that may have been found in the veins of your legs (deep vein thrombosis) or in your lungs (pulmonary embolism) and to reduce the risk of them occurring again.  What do You need to know about Eliquis ? The starting dose is 10 mg (two 5 mg tablets) taken TWICE daily for the FIRST SEVEN (7) DAYS, then on (enter date)  May 1st  the dose is reduced to ONE 5 mg tablet taken TWICE daily.  Eliquis may be taken with or without food.   Try to take the dose about the same time in the morning and in the evening. If you have difficulty swallowing the tablet whole please discuss with your pharmacist how to take the medication safely.  Take Eliquis exactly as prescribed and DO NOT stop taking Eliquis without talking to the doctor who prescribed the medication.  Stopping may increase your risk of developing a new blood clot.  Refill your prescription before you run out.  After discharge, you should have regular check-up appointments with your healthcare provider that is prescribing your Eliquis.    What do you do if you miss a dose? If a dose of ELIQUIS is not taken at the scheduled time, take it as soon as possible on the same day and twice-daily administration should be resumed. The dose should not be doubled to make up for a missed dose.  Important Safety Information A possible side effect of Eliquis is bleeding. You should call your healthcare provider right away if you experience any of the following: ? Bleeding from an injury or your nose that does not stop. ? Unusual colored urine (red or dark brown) or unusual colored stools (red or black). ? Unusual bruising for unknown reasons. ? A  serious fall or if you hit your head (even if there is no bleeding).  Some medicines may interact with Eliquis and might increase your risk of bleeding or clotting while on Eliquis. To help avoid this, consult your healthcare provider or pharmacist prior to using any new prescription or non-prescription medications, including herbals, vitamins, non-steroidal anti-inflammatory drugs (NSAIDs) and supplements.  This website has more information on Eliquis (apixaban): http://[email or web address].com/eliquis/home

## 2019-04-08 NOTE — Progress Notes (Signed)
Patient developed toxic encephalopathy due to lorazepam, now clinically improving. Arterial blood gas with resolved acute respiratory acidosis.   Clinically still hypervolemic after 1500 ml diuresis. Pa02 58.4 with Oxygen saturation 89.8 % on 4 lpm [place].   Will order second dose of furosemide 40 mg x1. Continue po metoprolol. Continue telemetry and oxymetry monitoring. Keep MAP greater than 60.

## 2019-04-08 NOTE — Progress Notes (Signed)
PROGRESS NOTE    Venancio PoissonDeborah S Cress  ZOX:[id number] DOB: 12/05/1946 DOA: 03/19/2019 PCP: Richmond CampbellKaplan, Kristen W., PA-C    Brief Narrative:  73 year old female who presented withrighthip pain and urinary tract symptoms.She does have significant past medical history for hypertension, dyslipidemia, pancreatitis, alcohol abuse, depression, anxiety and GERD. She had a recent left intratrochanteric femur fracture status post intramedullary nail placement March 22, 2019. Patient had several days of right sided abdominal pain, complicated with fever, confusion and severe anorexia. She had generalized weakness, dysuria, urinary frequency and urgency. Apparently patient continued drinks alcohol daily. On her initial physical examination she was febrile 101.9 F,her blood pressure was 91/56, heart rate 93, respiratory rate 23, oxygen saturation 93%, she had dry mucous membranes, her lungswere clear to auscultation bilaterally, heart S1-S2 present and rhythmic, tachycardic, abdomen with positive tenderness and guarding, mainly at the suprapubic region and right lower quadrant,no lower extremity edema.Sodium 132, potassium 3.0, chloride 100, bicarb 21, glucose 277, BUN 32, creatinine 0.63, lipase 82, AST 51, ALT 37,white count 19.5, hemoglobin 9.3, hematocrit 28.7, platelets 329,urinalysis had more than 50 white cells,her chest radiograph had left rotation, positive hyperinflation, no infiltrates. Abdominal CT with acute pancreatitis involving the body and tail of the pancreas. 2.1 cm hypodense fluid collection at TS into the pancreatic tail and posterior fundus of the stomach concerning for pseudocyst. Nonocclusive splenic vein/portal vein thrombosis at the confluence of the splenic vein, superior mesenteric vein and portal vein. EKG 115 bpm, with left axis deviation, normal intervals, sinus rhythm with normal complexes, no significant ST segment or T wave changes.  Patient was admitted to the hospital  with a working diagnosis of sepsis due to acute pancreatitis and urinary tract infection   Assessment & Plan:   Principal Problem:   Sepsis (HCC) Active Problems:   Acute pancreatitis   UTI (urinary tract infection)   Pancreatic pseudocyst   Portal vein thrombosis   1. Sepsis due to urinary tract infection (present on admission)/ Proteus bacteremia (gram negative sepsis). Proteus mirabilis sensitive to ciprofloxacin, will change antibiotic this am to fluoroquinolone/ ciprofloxacin IV. WBC up to 15, patient has been afebrile. Will follow blood cultures in am. Continue to follow cell count.   2. Acute systolic heart failure, stress induced cardiomyopathy, complicated with cardiogenic pulmonary edema and acute hypoxic respiratory failure. Echocardiogram with apical LV hypokinetic with hyperdynamic base, consistent with takotsubo's cardiomyopathy, that correlates well with patient's clinical presentation of gram negative sepsis. Will add B blockade with metoprolol 50 mg po bid, and will follow chest film. Patient had increases oxygen requirements this am up to 4 LPM, had furosemide yesterday total 60 mg with urine output of 2000 ml. Hod on ace inh for now due to risk of hypotension. Target MAP of 60.   2. Acute pancreatitis complicated with pseudocyst and portal vein thrombosis. Patient tolerating well apixaban for anticoagulation. Diet advanced to soft with good toleration. Improved abdominal pain.    3. Alcohol abuse with potential for withdrawal. Patient continue to have anxiety but no agitation will continue benzodiazepine per protocol CIWA. Clonazepam has been resumed at 0,5 mg po bid.  4. Hypokalemia, hyponatremia, hyperchloremic non gap metabolic acidosis. Stable renal function with serum cr at 0.46, K corrected at 4,4, improved serum bicarbonate at 20 and Cl at 109. Follow renal panel in am, plus Mg.   5. Metabolic encephalopathy. Continue with clonazepam and amitriptyline. As needed  lorazepam per CIWA protocol. Continue thiamine, multivitamins, and will consult nutrition.   DVT prophylaxis:apixaban Code  Status:full Family Communication:I spoke with patient's daughter yesterday over the phone for updates and all questions were addressed.    Body mass index is 17.75 kg/m. Malnutrition Type:      Malnutrition Characteristics:      Nutrition Interventions:     RN Pressure Injury Documentation:     Consultants:     Procedures:     Antimicrobials:   Aztreonam dc 04/24  Ciprofloxacin 04/25    Subjective: Patient denies dyspnea, but had increase oxygen requirements up to 4 LPM per [place], no chest pain, no nausea or vomiting. Abdominal pain has improved. Positive fatigue and generalized weakness.   Objective: Vitals:   04/08/19 0412 04/08/19 0500 04/08/19 0600 04/08/19 0700  BP:  113/73 109/67 100/63  Pulse:    (!) 118  Resp:  15 (!) 23 (!) 45  Temp: 98.5 F (36.9 C)     TempSrc: Oral     SpO2:  97%  90%  Weight:      Height:        Intake/Output Summary (Last 24 hours) at 04/08/2019 0756 Last data filed at 04/08/2019 0388 Gross per 24 hour  Intake 790 ml  Output 2000 ml  Net -1210 ml   Filed Weights   04/16/2019 1528  Weight: 49.9 kg    Examination:   General: deconditioned and ill looking appearing  Neurology: Awake and alert, non focal  E ENT: positive pallor, no icterus, oral mucosa moist Cardiovascular: Mikd JVD. S1-S2 present, rhythmic tachycardic, no gallops, rubs, or murmurs. + pitting lower extremity edema. Pulmonary: vesicular breath sounds bilaterally, adequate air movement, no wheezing, rhonchi or rales. Gastrointestinal. Abdomen mild distended but with no organomegaly, non tender, no rebound or guarding Skin. No rashes Musculoskeletal: no joint deformities     Data Reviewed: I have personally reviewed following labs and imaging studies  CBC: Recent Labs  Lab 04-16-19 1550 04/06/19 0302 04/07/19 0252  04/08/19 0320  WBC 19.5* 14.0* 8.0 15.4*  NEUTROABS 18.0*  --   --  13.0*  HGB 9.3* 9.4* 8.5* 11.7*  HCT 28.7* 29.8* 26.6* 35.0*  MCV 110.4* 112.0* 111.3* 106.4*  PLT 329 270 183 200   Basic Metabolic Panel: Recent Labs  Lab Apr 16, 2019 1513 Apr 16, 2019 1550 04/06/19 0847 04/07/19 0252 04/08/19 0320  NA  --  132* 139 141 138  K  --  3.0* 4.9 3.0* 4.4  CL  --  100 113* 116* 109  CO2  --  21* 17* 19* 20*  GLUCOSE  --  277* 165* 126* 162*  BUN  --  32* 20 16 14   CREATININE  --  0.63 0.40* 0.33* 0.46  CALCIUM  --  7.8* 7.5* 7.2* 8.0*  MG 2.2  --   --   --   --    GFR: Estimated Creatinine Clearance: 50.8 mL/min (by C-G formula based on SCr of 0.46 mg/dL). Liver Function Tests: Recent Labs  Lab Apr 16, 2019 1550 04/06/19 0302 04/07/19 0252  AST 51* 33 23  ALT 37 29 21  ALKPHOS 215* 200* 152*  BILITOT 0.5 0.5 0.5  PROT 5.3* 5.0* 4.0*  ALBUMIN 2.3* 2.2* 1.6*   Recent Labs  Lab 04/16/19 1550 04/07/19 0252  LIPASE 82* 82*   No results for input(s): AMMONIA in the last 168 hours. Coagulation Profile: No results for input(s): INR, PROTIME in the last 168 hours. Cardiac Enzymes: No results for input(s): CKTOTAL, CKMB, CKMBINDEX, TROPONINI in the last 168 hours. BNP (last 3 results) No results for input(s): PROBNP  in the last 8760 hours. HbA1C: Recent Labs    04/06/19 0302  HGBA1C 5.9*   CBG: Recent Labs  Lab May 01, 2019 2136 04/06/19 0807 04/06/19 1123 04/06/19 1622 04/06/19 2135  GLUCAP 149* 129* 148* 139* 146*   Lipid Profile: No results for input(s): CHOL, HDL, LDLCALC, TRIG, CHOLHDL, LDLDIRECT in the last 72 hours. Thyroid Function Tests: No results for input(s): TSH, T4TOTAL, FREET4, T3FREE, THYROIDAB in the last 72 hours. Anemia Panel: Recent Labs    04/06/19 0302  VITAMINB12 2,035*  FOLATE 21.9  FERRITIN 42  TIBC 136*  IRON 8*  RETICCTPCT 3.1      Radiology Studies: I have reviewed all of the imaging during this hospital visit personally      Scheduled Meds: . amitriptyline  25 mg Oral QHS  . apixaban  10 mg Oral BID   Followed by  . [START ON 04/14/2019] apixaban  5 mg Oral BID  . Chlorhexidine Gluconate Cloth  6 each Topical Daily  . clonazepam  0.5 mg Oral BID  . folic acid  1 mg Oral Daily  . mouth rinse  15 mL Mouth Rinse BID  . metoprolol tartrate  50 mg Oral BID  . multivitamin with minerals  1 tablet Oral Daily  . nicotine  21 mg Transdermal Daily  . saccharomyces boulardii  250 mg Oral BID  . thiamine injection  100 mg Intravenous Daily   Continuous Infusions: . aztreonam 2 g (04/08/19 0653)     LOS: 3 days        Colandra Ohanian Annett Gula, MD

## 2019-04-09 ENCOUNTER — Inpatient Hospital Stay (HOSPITAL_COMMUNITY): Payer: PPO

## 2019-04-09 DIAGNOSIS — J9690 Respiratory failure, unspecified, unspecified whether with hypoxia or hypercapnia: Secondary | ICD-10-CM

## 2019-04-09 DIAGNOSIS — J9601 Acute respiratory failure with hypoxia: Secondary | ICD-10-CM

## 2019-04-09 DIAGNOSIS — I5181 Takotsubo syndrome: Secondary | ICD-10-CM

## 2019-04-09 LAB — BASIC METABOLIC PANEL
Anion gap: 10 (ref 5–15)
BUN: 13 mg/dL (ref 8–23)
CO2: 27 mmol/L (ref 22–32)
Calcium: 7.9 mg/dL — ABNORMAL LOW (ref 8.9–10.3)
Chloride: 103 mmol/L (ref 98–111)
Creatinine, Ser: 0.36 mg/dL — ABNORMAL LOW (ref 0.44–1.00)
GFR calc Af Amer: >60 mL/min (ref >60)
GFR calc non Af Amer: >60 mL/min (ref >60)
Glucose, Bld: 209 mg/dL — ABNORMAL HIGH (ref 70–99)
Potassium: 2.7 mmol/L — CL (ref 3.5–5.1)
Sodium: 140 mmol/L (ref 135–145)

## 2019-04-09 LAB — CBC WITH DIFFERENTIAL/PLATELET
Abs Immature Granulocytes: 0.21 10*3/uL — ABNORMAL HIGH (ref 0.00–0.07)
Basophils Absolute: 0 10*3/uL (ref 0.0–0.1)
Basophils Relative: 0 %
Eosinophils Absolute: 0 10*3/uL (ref 0.0–0.5)
Eosinophils Relative: 0 %
HCT: 35.1 % — ABNORMAL LOW (ref 36.0–46.0)
Hemoglobin: 11.1 g/dL — ABNORMAL LOW (ref 12.0–15.0)
Immature Granulocytes: 1 %
Lymphocytes Relative: 6 %
Lymphs Abs: 1 10*3/uL (ref 0.7–4.0)
MCH: 34.7 pg — ABNORMAL HIGH (ref 26.0–34.0)
MCHC: 31.6 g/dL (ref 30.0–36.0)
MCV: 109.7 fL — ABNORMAL HIGH (ref 80.0–100.0)
Monocytes Absolute: 1 10*3/uL (ref 0.1–1.0)
Monocytes Relative: 7 %
Neutro Abs: 12.8 10*3/uL — ABNORMAL HIGH (ref 1.7–7.7)
Neutrophils Relative %: 86 %
Platelets: 264 10*3/uL (ref 150–400)
RBC: 3.2 MIL/uL — ABNORMAL LOW (ref 3.87–5.11)
RDW: 15.5 % (ref 11.5–15.5)
WBC: 15 10*3/uL — ABNORMAL HIGH (ref 4.0–10.5)
nRBC: 0 % (ref 0.0–0.2)

## 2019-04-09 LAB — MAGNESIUM: Magnesium: 1.4 mg/dL — ABNORMAL LOW (ref 1.7–2.4)

## 2019-04-09 MED ORDER — METOPROLOL TARTRATE 5 MG/5ML IV SOLN
5.0000 mg | INTRAVENOUS | Status: DC | PRN
  Administered 2019-04-19: 5 mg via INTRAVENOUS
  Filled 2019-04-09: qty 5

## 2019-04-09 MED ORDER — POTASSIUM CHLORIDE 10 MEQ/100ML IV SOLN
10.0000 meq | INTRAVENOUS | Status: AC
  Administered 2019-04-09 (×4): 10 meq via INTRAVENOUS
  Filled 2019-04-09 (×4): qty 100

## 2019-04-09 MED ORDER — ENOXAPARIN SODIUM 60 MG/0.6ML ~~LOC~~ SOLN
50.0000 mg | Freq: Two times a day (BID) | SUBCUTANEOUS | Status: DC
  Administered 2019-04-09 – 2019-04-13 (×10): 50 mg via SUBCUTANEOUS
  Filled 2019-04-09 (×11): qty 0.5

## 2019-04-09 MED ORDER — METOPROLOL TARTRATE 25 MG PO TABS
25.0000 mg | ORAL_TABLET | Freq: Two times a day (BID) | ORAL | Status: DC
  Filled 2019-04-09: qty 1

## 2019-04-09 MED ORDER — BOOST / RESOURCE BREEZE PO LIQD CUSTOM
1.0000 | Freq: Two times a day (BID) | ORAL | Status: DC
  Administered 2019-04-09 – 2019-04-21 (×16): 1 via ORAL

## 2019-04-09 MED ORDER — FUROSEMIDE 10 MG/ML IJ SOLN
40.0000 mg | Freq: Once | INTRAMUSCULAR | Status: AC
  Administered 2019-04-09: 40 mg via INTRAVENOUS
  Filled 2019-04-09: qty 4

## 2019-04-09 MED ORDER — TRAZODONE HCL 50 MG PO TABS
50.0000 mg | ORAL_TABLET | Freq: Once | ORAL | Status: AC
  Administered 2019-04-09: 50 mg via ORAL
  Filled 2019-04-09: qty 1

## 2019-04-09 MED ORDER — ENSURE ENLIVE PO LIQD
237.0000 mL | Freq: Two times a day (BID) | ORAL | Status: DC
  Administered 2019-04-09 – 2019-04-11 (×3): 237 mL via ORAL

## 2019-04-09 MED ORDER — POTASSIUM CHLORIDE 20 MEQ PO PACK
40.0000 meq | PACK | Freq: Once | ORAL | Status: DC
  Filled 2019-04-09: qty 2

## 2019-04-09 MED ORDER — METOPROLOL TARTRATE 5 MG/5ML IV SOLN
5.0000 mg | Freq: Three times a day (TID) | INTRAVENOUS | Status: DC
  Filled 2019-04-09: qty 5

## 2019-04-09 MED ORDER — PRO-STAT SUGAR FREE PO LIQD
30.0000 mL | Freq: Every day | ORAL | Status: DC
  Administered 2019-04-10 – 2019-04-19 (×5): 30 mL via ORAL
  Filled 2019-04-09 (×6): qty 30

## 2019-04-09 MED ORDER — METOPROLOL TARTRATE 25 MG PO TABS
25.0000 mg | ORAL_TABLET | Freq: Two times a day (BID) | ORAL | Status: DC
  Administered 2019-04-09 – 2019-04-10 (×2): 25 mg via ORAL
  Filled 2019-04-09 (×2): qty 1

## 2019-04-09 MED ORDER — MAGNESIUM SULFATE 2 GM/50ML IV SOLN
2.0000 g | Freq: Once | INTRAVENOUS | Status: AC
  Administered 2019-04-09: 2 g via INTRAVENOUS
  Filled 2019-04-09: qty 50

## 2019-04-09 NOTE — Progress Notes (Signed)
CRITICAL VALUE ALERT  Critical Value: K+ 2.7  Date & Time Notied:  04/09/19 @ 0200am  Provider Notified: [facility] on call  Orders Received/Actions taken: 4 runs K+

## 2019-04-09 NOTE — Evaluation (Signed)
Clinical/Bedside Swallow Evaluation Patient Details  Name: April [name] MRN: [id number] Date of Birth: 12/09/1946  Today's Date: 04/09/2019 Time: SLP Start Time (ACUTE ONLY): 1255 SLP Stop Time (ACUTE ONLY): 1319 SLP Time Calculation (min) (ACUTE ONLY): 24 min  Past Medical History:  Past Medical History:  Diagnosis Date  . Anxiety   . Current smoker   . Depression   . ETOH abuse   . GERD (gastroesophageal reflux disease)   . History of shingles summer 2015  . HTN (hypertension)   . Hyperlipidemia   . Hypertension   . Leg edema   . Neuropathy   . Neuropathy   . Pancreatitis    Past Surgical History:  Past Surgical History:  Procedure Laterality Date  . ERCP N/A 02/01/2017   Procedure: ENDOSCOPIC RETROGRADE CHOLANGIOPANCREATOGRAPHY (ERCP);  Surgeon: Dorena CookeyJohn Hayes, MD;  [facility];  Service: Endoscopy;  Laterality: N/A;  . INTRAMEDULLARY (IM) NAIL INTERTROCHANTERIC Left 03/22/2019   Procedure: INTRAMEDULLARY (IM) NAIL INTERTROCHANTRIC;  Surgeon: Kathryne HitchBlackman, Christopher Y, MD;  Location: [facility];  Service: Orthopedics;  Laterality: Left;  . LAPAROSCOPIC CHOLECYSTECTOMY WITH COMMON DUCT EXPLORATION AND INTRAOP CHOLANGIOGRAM N/A 02/03/2017   Procedure: LAPAROSCOPIC CHOLECYSTECTOMY WITH POSSIBLE COMMON DUCT EXPLORATION AND INTRAOP CHOLANGIOGRAM;  Surgeon: Ovidio Kinavid Newman, MD;  Location: [facility];  Service: General;  Laterality: N/A;  . removal of uterine fibroids  2004   HPI:  73 y.o. female with medical history significant of anxiety, tobacco use, depression, alcohol abuse, GERD, hypertension, hyperlipidemia, pancreatitis, recent left intertrochanteric femur fracture status post intervention with intramedullary nail placement on 03/22/2019 presenting to the hospital for evaluation of hip pain and UTI symptoms Pt with hx of MBS 08/03/18 indicating Dysphagia 3/nectar-thickened liquids d/t penetration with thin/delayed swallow response with initiation at valleculae across all consistencies  and poor airway closure; aspiration only noted during pill consumption. Subsequent BSE on 03/23/19 indicating Regular/thin liquids. CXR on 04/08/19 indicated Worsening aeration. The observed pattern of pulmonary opacities is more typical of infection, but pulmonary edema is not excluded  Assessment / Plan / Recommendation Clinical Impression   Pt presents with mild oropharyngeal dysphagia characterized by oral holding intermittently (d/t cognitive changes) and multiple swallows, immediate cough x1 with ice chips (pt with extreme xerostomia d/t limited intake/nasal cannula in place); subsequent swallows did not initiate an immediate or delayed cough and/or throat clearing; pt with generalized weakness overall; recommend continuing soft diet with thin liquids via cup/small sips (pt does not typically use a straw) and medication crushed with puree d/t last MBS results; ST will f/u for diet tolerance and swallowing safety education with pt/family.  Thank you for this consult. SLP Visit Diagnosis: Dysphagia, oropharyngeal phase (R13.12)    Aspiration Risk  Mild aspiration risk    Diet Recommendation   Dysphagia 3/thin liquids   Medication Administration: Crushed with puree    Other  Recommendations Oral Care Recommendations: Oral care BID   Follow up Recommendations 24 hour supervision/assistance      Frequency and Duration min 2x/week  1 week       Prognosis Prognosis for Safe Diet Advancement: Good Barriers to Reach Goals: Cognitive deficits      Swallow Study   General Date of Onset: (03/23/2019) HPI: 73 y.o. female with medical history significant of anxiety, tobacco use, depression, alcohol abuse, GERD, hypertension, hyperlipidemia, pancreatitis, recent left intertrochanteric femur fracture status post intervention with intramedullary nail placement on 03/22/2019 presenting to the hospital for evaluation of hip pain and UTI symptoms Type of Study: Bedside Swallow  Evaluation Previous Swallow  Assessment: (07/25/18 MBS revealed moderate oropharyngeal dysphagia) Diet Prior to this Study: Dysphagia 3 (soft);Thin liquids Temperature Spikes Noted: No Respiratory Status: Nasal cannula History of Recent Intubation: No Behavior/Cognition: Alert;Cooperative;Confused;Distractible Oral Cavity Assessment: Dry Oral Care Completed by SLP: Yes Oral Cavity - Dentition: Adequate natural dentition;Poor condition Vision: Functional for self-feeding Self-Feeding Abilities: Able to feed self;Needs assist;Other (Comment)(needs encouragement to consume) Patient Positioning: Upright in bed Baseline Vocal Quality: Low vocal intensity Volitional Cough: Weak;Other (Comment)(not recorded volitionally; noted non-volitionally) Volitional Swallow: Able to elicit    Oral/Motor/Sensory Function Overall Oral Motor/Sensory Function: Generalized oral weakness Facial Symmetry: Within Functional Limits Lingual Symmetry: Within Functional Limits Lingual Strength: Reduced   Ice Chips Ice chips: Impaired Presentation: Spoon Oral Phase Functional Implications: Oral holding Pharyngeal Phase Impairments: Suspected delayed Swallow;Cough - Delayed   Thin Liquid Thin Liquid: Impaired Presentation: Cup;Spoon Oral Phase Functional Implications: Oral holding Pharyngeal  Phase Impairments: Suspected delayed Swallow;Multiple swallows    Nectar Thick Nectar Thick Liquid: Not tested   Honey Thick Honey Thick Liquid: Not tested   Puree Puree: Impaired Presentation: Spoon Oral Phase Functional Implications: Oral holding Pharyngeal Phase Impairments: Suspected delayed Swallow;Multiple swallows   Solid     Solid: Impaired Presentation: Spoon Oral Phase Functional Implications: Prolonged oral transit Pharyngeal Phase Impairments: Suspected delayed Swallow;Multiple swallows      April [name], [name], [name] 04/09/2019,1:44 PM

## 2019-04-09 NOTE — Progress Notes (Signed)
PROGRESS NOTE    April [name]  VDI:[id number] DOB: November 20, 1946 DOA: 04-07-19 PCP: Richmond Campbell., PA-C    Brief Narrative:  73 year old female who presented withrighthip pain and urinary tract symptoms.She does have significant past medical history for hypertension, dyslipidemia, pancreatitis, alcohol abuse, depression, anxiety and GERD. She had a recent left intratrochanteric femur fracture status post intramedullary nail placement March 22, 2019. Patient had several days of right sided abdominal pain, complicated with fever, confusion and severe anorexia. She had generalized weakness, dysuria, urinary frequency and urgency. Apparently patient continued drinks alcohol daily. On her initial physical examination she was febrile 101.9 F,her blood pressure was 91/56, heart rate 93, respiratory rate 23, oxygen saturation 93%, she had dry mucous membranes, her lungswere clear to auscultation bilaterally, heart S1-S2 present and rhythmic, tachycardic, abdomen with positive tenderness and guarding, mainly at the suprapubic region and right lower quadrant,no lower extremity edema.Sodium 132, potassium 3.0, chloride 100, bicarb 21, glucose 277, BUN 32, creatinine 0.63, lipase 82, AST 51, ALT 37,white count 19.5, hemoglobin 9.3, hematocrit 28.7, platelets 329,urinalysis had more than 50 white cells,her chest radiograph had left rotation, positive hyperinflation, no infiltrates. Abdominal CT with acute pancreatitis involving the body and tail of the pancreas. 2.1 cm hypodense fluid collection at TS into the pancreatic tail and posterior fundus of the stomach concerning for pseudocyst. Nonocclusive splenic vein/portal vein thrombosis at the confluence of the splenic vein, superior mesenteric vein and portal vein. EKG 115 bpm, with left axis deviation, normal intervals, sinus rhythm with normal complexes, no significant ST segment or T wave changes.  Patient was admitted to the hospital  with a working diagnosis of sepsis due to acute pancreatitis and urinary tract infection.  Gram negative sepsis, complicated with stress induced cardiomyopathy, acute cardiogenic pulmonary edema and acute hypoxic respiratory failure.   Assessment & Plan:   Principal Problem:   Sepsis (HCC) Active Problems:   Acute pancreatitis   UTI (urinary tract infection)   Pancreatic pseudocyst   Portal vein thrombosis   Stress-induced cardiomyopathy   Respiratory failure (HCC) hypoxic and hypercapnic/ acute   1. Sepsis due to urinary tract infection (present on admission)/ Proteus bacteremia (gram negative sepsis).Will continue IV  Ciprofloxacin 400 mg bid. Her wbc is stable at 15, T max yesterday at 12:47 38.3 C. Will continue diuresis for volume overload.   2. Acute systolic heart failure, stress induced cardiomyopathy, complicated with cardiogenic pulmonary edema and acute hypoxic respiratory failure. Echocardiogram with apical LV hypokinetic with hyperdynamic base, consistent with takotsubo's cardiomyopathy. EF 35 to 40%. Patient is responding to diuresis with IV furosemide, urine output over last 24 H, 3,950 mL. Patient is more wake today to take metoprolol 25 mg po bid, continue telemetry monitoring. Patient has been sinus tach. No ace inh or arb due to risk of hypotension.  Chest film improved but continue to have bilateral interstitial infiltrates, right upper and left lower lobe. Will continue diuresis with IV furosemide. Keep MAP more than 60.   2. Acute pancreatitis complicated with pseudocyst and portal vein thrombosis.Continue apixaban for anticoagulation. Will keep soft diet for now, continue pain control with morphine IV.   3. Alcohol abuse with potential for withdrawal.Benzodiazepines have been discontinued due to oversedation.   4. Hypokalemia,hyponatremia, hypomagnesemia, hyperchloremic non gap metabolic acidosis.K this am down to 2,7, will continue K correction, 40 meq  ordered this am, will add extra 40 po for this pm. Mg at 1,4 this am received IV Mag sulfate. Will follow on renal panel  in am.    5. Metabolic encephalopathy with acute hypercapnic respiratory failure. More awake this am, continue aspiration precautions, nutritional supplements, thiamine and multivitamins.   DVT prophylaxis:apixaban Code Status:full Family Communication:I spoke with patient's daughter yesterday over the phone for updates and all questions were addressed.  Patient critically ill, will continue medical therapy to prevent imminent deterioration. Critical care time 35 minutes.   Body mass index is 17.75 kg/m. Malnutrition Type:      Malnutrition Characteristics:      Nutrition Interventions:     RN Pressure Injury Documentation:     Consultants:     Procedures:     Antimicrobials:   Aztreonam last dose 04/24  Ciprofloxacin  04/25   Subjective: Patient continue to be very weak and deconditioned, continue to have dyspnea and abdominal pain. Yesterday sedated and poorly responsive. Tolerating po diet.   Objective: Vitals:   04/09/19 0354 04/09/19 0400 04/09/19 0500 04/09/19 0600  BP:  (!) 103/56 96/63 97/60   Pulse:  (!) 105 (!) 101 (!) 104  Resp:  (!) 35 20 (!) 31  Temp: 97.6 F (36.4 C)     TempSrc: Oral     SpO2:  95% 98% 98%  Weight:      Height:        Intake/Output Summary (Last 24 hours) at 04/09/2019 0751 Last data filed at 04/09/2019 0600 Gross per 24 hour  Intake 641.67 ml  Output 3950 ml  Net -3308.33 ml   Filed Weights   03/16/2019 1528  Weight: 49.9 kg    Examination:   General: deconditioned and ill looking appearing  Neurology: Awake and alert, non focal  E ENT: positive pallor, no icterus Cardiovascular: MIld JVD. S1-S2 present, rhythmic, tachycardic, no gallops, rubs, or murmurs. No lower extremity edema. Pulmonary: positive breath sounds bilaterally, no wheezing, or rhonchi, but positive rales.  Gastrointestinal. Abdomen with no organomegaly, mild tender to palpation, but no rebound or guarding Skin. No rashes Musculoskeletal: no joint deformities     Data Reviewed: I have personally reviewed following labs and imaging studies  CBC: Recent Labs  Lab 03/29/2019 1550 04/06/19 0302 04/07/19 0252 04/08/19 0320 04/09/19 0042  WBC 19.5* 14.0* 8.0 15.4* 15.0*  NEUTROABS 18.0*  --   --  13.0* 12.8*  HGB 9.3* 9.4* 8.5* 11.7* 11.1*  HCT 28.7* 29.8* 26.6* 35.0* 35.1*  MCV 110.4* 112.0* 111.3* 106.4* 109.7*  PLT 329 270 183 200 264   Basic Metabolic Panel: Recent Labs  Lab 03/24/2019 1513 03/27/2019 1550 04/06/19 0847 04/07/19 0252 04/08/19 0320 04/09/19 0042  NA  --  132* 139 141 138 140  K  --  3.0* 4.9 3.0* 4.4 2.7*  CL  --  100 113* 116* 109 103  CO2  --  21* 17* 19* 20* 27  GLUCOSE  --  277* 165* 126* 162* 209*  BUN  --  32* CREATININE  --  0.63 0.40* 0.33* 0.46 0.36*  CALCIUM  --  7.8* 7.5* 7.2* 8.0* 7.9*  MG 2.2  --   --   --   --  1.4*   GFR: Estimated Creatinine Clearance: 50.8 mL/min (A) (by C-G formula based on SCr of 0.36 mg/dL (L)). Liver Function Tests: Recent Labs  Lab 03/31/2019 1550 04/06/19 0302 04/07/19 0252  AST 51* 33 23  ALT 37 29 21  ALKPHOS 215* 200* 152*  BILITOT 0.5 0.5 0.5  PROT 5.3* 5.0* 4.0*  ALBUMIN 2.3* 2.2* 1.6*   Recent Labs  Lab Oct 28, 2019 1550 04/07/19 0252  LIPASE 82* 82*   No results for input(s): AMMONIA in the last 168 hours. Coagulation Profile: No results for input(s): INR, PROTIME in the last 168 hours. Cardiac Enzymes: No results for input(s): CKTOTAL, CKMB, CKMBINDEX, TROPONINI in the last 168 hours. BNP (last 3 results) No results for input(s): PROBNP in the last 8760 hours. HbA1C: No results for input(s): HGBA1C in the last 72 hours. CBG: Recent Labs  Lab 04/06/19 0807 04/06/19 1123 04/06/19 1622 04/06/19 2135 04/07/19 0750  GLUCAP 129* 148* 139* 146* 136*   Lipid Profile: No results for  input(s): CHOL, HDL, LDLCALC, TRIG, CHOLHDL, LDLDIRECT in the last 72 hours. Thyroid Function Tests: No results for input(s): TSH, T4TOTAL, FREET4, T3FREE, THYROIDAB in the last 72 hours. Anemia Panel: No results for input(s): VITAMINB12, FOLATE, FERRITIN, TIBC, IRON, RETICCTPCT in the last 72 hours.    Radiology Studies: I have reviewed all of the imaging during this hospital visit personally     Scheduled Meds: . amitriptyline  25 mg Oral QHS  . apixaban  10 mg Oral BID   Followed by  . [START ON 04/14/2019] apixaban  5 mg Oral BID  . Chlorhexidine Gluconate Cloth  6 each Topical Daily  . folic acid  1 mg Oral Daily  . mouth rinse  15 mL Mouth Rinse BID  . metoprolol tartrate  50 mg Oral BID  . multivitamin with minerals  1 tablet Oral Daily  . nicotine  21 mg Transdermal Daily  . saccharomyces boulardii  250 mg Oral BID  . thiamine injection  100 mg Intravenous Daily   Continuous Infusions: . ciprofloxacin Stopped (04/09/19 0022)  . magnesium sulfate 1 - 4 g bolus IVPB    . potassium chloride 10 mEq (04/09/19 0738)     LOS: 4 days        Simrin Vegh Annett Gulaaniel Daquann Merriott, MD

## 2019-04-09 NOTE — Progress Notes (Signed)
ANTICOAGULATION CONSULT NOTE  Pharmacy Consult for Apixaban ==> Lovenox Indication: portal vein thrombosis  Allergies  Allergen Reactions  . Naltrexone Shortness Of Breath  . Rocephin [Ceftriaxone Sodium In Dextrose] Anaphylaxis  . Penicillins Rash    Has patient had a PCN reaction causing immediate rash, facial/tongue/throat swelling, SOB or lightheadedness with hypotension: Yes immediate rash  Has patient had a PCN reaction causing severe rash involving mucus membranes or skin necrosis: Yes Has patient had a PCN reaction that required hospitalization": No Has patient had a PCN reaction occurring within the last 10 years: Yes If all of the above answers are "NO", then may proceed with Cephalosporin use.    Patient Measurements: Height: 5\' 6"  (167.6 cm) Weight: 110 lb (49.9 kg) IBW/kg (Calculated) : 59.3 Heparin Dosing Weight: 50 kg  Vital Signs: Temp: 98.3 F (36.8 C) (04/26 0800) Temp Source: Oral (04/26 0800) BP: 97/60 (04/26 0600) Pulse Rate: 102 (04/26 0837)  Labs: Recent Labs    04/06/19 1342 04/06/19 2318  04/07/19 0252 04/07/19 0751 04/08/19 0320 04/09/19 0042  HGB  --   --    < > 8.5*  --  11.7* 11.1*  HCT  --   --   --  26.6*  --  35.0* 35.1*  PLT  --   --   --  183  --  200 264  HEPARINUNFRC 0.10* 0.31  --   --  0.39  --   --   CREATININE  --   --   --  0.33*  --  0.46 0.36*   < > = values in this interval not displayed.   Estimated Creatinine Clearance: 50.8 mL/min (A) (by C-G formula based on SCr of 0.36 mg/dL (L)).  Assessment: 73 y.o F s/p recent fall with femur fracture and ORIF on 03/22/19, presented to the ED on 4/22 with c/o fever and hip pain.  Abd CT on 4/22 showed acute pancreatitis and nonocclusive splenic vein/portal vein thrombus - started Heparin 4/22 pm.  Today, 04/09/2019:  Patient has been too sleepy to take oral medications  Apixaban d/c'ed and pharmacy consulted to dose Lovenox  Last dose of Apixaban = 4/24 @ 23?43   Goal of  Therapy:  Full anticoagulation Monitor platelets by anticoagulation protocol: Yes   Plan:  - Apixaban d/c'ed   - Lovenox 50mg  sq q12h - Monitor CBC, s/s bleed  Latrease Kunde, Joselyn Glassman, PharmD 04/09/2019,10:36 AM

## 2019-04-09 NOTE — Progress Notes (Signed)
Initial Nutrition Assessment  RD working remotely.   DOCUMENTATION CODES:   Underweight  INTERVENTION:  - will order boost breeze BID, each supplement provides 250 kcal and 9 grams of protein. - will order ensure enlive BID, each supplement provides 350 kcal and 20 grams of protein. - Will order 30 mL prostat once/day, each supplement provides 100 kcal and 15 grams of protein. - continue to encourage PO intakes.   Monitor magnesium, potassium, and phosphorus daily for at least 3 days, MD to replete as needed, as pt is at risk for refeeding syndrome given ongoing alcohol abuse, current hypokalemia and hypomagnesemia.--although given patient on PO diet, she will likely self limit so risk of refeeding decreased.     NUTRITION DIAGNOSIS:   Increased nutrient needs related to acute illness(sepsis, pancreatitis) as evidenced by estimated needs.  GOAL:   Patient will meet greater than or equal to 90% of their needs  MONITOR:   PO intake, Supplement acceptance, Labs, Weight trends  REASON FOR ASSESSMENT:   Consult Assessment of nutrition requirement/status  ASSESSMENT:   73 year old female with past medical hx of HTN, dyslipidemia, pancreatitis, alcohol abuse, depression, anxiety, and GERD. She had a recent L intratrochanteric femur fracture s/p intramedullary nail placement 4/8. She presented to the ED with R hip pain and urinary tract symptoms. Patient also reported several days of R-sided abdominal pain, fever, confusion, and severe feelings of anorexia. Patient stated generalized weakness, dysuria, urinary frequencys and urinary urgency. She continues to drink alcohol daily. Abdominal CT showed acute pancreatitis involving the body and tail of the pancreas and fluid collection concerning for pseudocyst. Patient was admitted with a working diagnosis of sepsis d/t acute pancreatitis and UTI.  Per review, diet advanced from NPO to CLD on 4/22 at 10:40 PM and then to soft on 4/24 at  7:55 AM. Per review of RN flow sheet, patient consumed 10% of dinner on 4/24. No other intakes documented since diet advancement. Patient was seen remotely by another RD on 4/8. Per review of that note, it was identified that patient had reported poor appetite and nausea with PO intakes for several months and that during a hospitalization in August 2019 patient had required altered diet: dysphagia 2 with nectar-thick liquids.  Per chart review, current weight is 110 lb and weight at [facility] on 03/03/19 was 93 lb. This indicates 17 lb weight gain in the past 1 month. Suspect that this at least partly fluid related given recent hospitalization and surgery.   Per Dr. Marin Comment note this AM: sepsis d/t UTI, acute heart failure and stress-induced cardiomyopathy, acute hypoxic respiratory failure, acute pancreatitis complicated by pseudocyst and portal vein thrombosis, alcohol abuse with potential for withdrawal, hypokalemia and hypomagnesemia with repletion ordered, metabolic encephalopathy.     Medications reviewed; 1 mg folvite/day, 40 mg IV lasxix x2 doses 4/25, 2 g IV Mg sulfate x1 run 4/26, daily multivitamin with minerals, 40 mEq Klor-Con x1 dose 4/26, 10 mEq IV KCl x4 runs 4/26, 250 mg florastor BID, 100 mg IV thiamine/day.  Labs reviewed; K: 2.7 mmol/l, creatinine: 0.36 mg/dl, Ca: 7.9 mg/dl, Mg: 1.4 mg/dl.      NUTRITION - FOCUSED PHYSICAL EXAM:  unable to complete at this time.   Diet Order:   Diet Order            DIET SOFT Room service appropriate? Yes; Fluid consistency: Thin  Diet effective now              EDUCATION NEEDS:  Not appropriate for education at this time  Skin:  Skin Assessment: Skin Integrity Issues: Skin Integrity Issues:: Incisions Incisions: L hip (4/8; PTA)  Last BM:  4/25  Height:   Ht Readings from Last 1 Encounters:  03/20/2019 5\' 6"  (1.676 m)    Weight:   Wt Readings from Last 1 Encounters:  03/28/2019 49.9 kg    Ideal Body Weight:  59.09  kg  BMI:  Body mass index is 17.75 kg/m.  Estimated Nutritional Needs:   Kcal:  1650-1850 kcal  Protein:  75-85 grams  Fluid:  >/= 1.8 L/day      Trenton GammonJessica Elya Tarquinio, MS, RD, LDN, [facility] Inpatient Clinical Dietitian Pager # [phone] After hours/weekend pager # [phone]

## 2019-04-10 LAB — BASIC METABOLIC PANEL
Anion gap: 9 (ref 5–15)
BUN: 13 mg/dL (ref 8–23)
CO2: 30 mmol/L (ref 22–32)
Calcium: 7.6 mg/dL — ABNORMAL LOW (ref 8.9–10.3)
Chloride: 99 mmol/L (ref 98–111)
Creatinine, Ser: 0.33 mg/dL — ABNORMAL LOW (ref 0.44–1.00)
GFR calc Af Amer: >60 mL/min (ref >60)
GFR calc non Af Amer: >60 mL/min (ref >60)
Glucose, Bld: 214 mg/dL — ABNORMAL HIGH (ref 70–99)
Potassium: 3.6 mmol/L (ref 3.5–5.1)
Sodium: 138 mmol/L (ref 135–145)

## 2019-04-10 LAB — CBC WITH DIFFERENTIAL/PLATELET
Abs Immature Granulocytes: 0.23 10*3/uL — ABNORMAL HIGH (ref 0.00–0.07)
Basophils Absolute: 0 10*3/uL (ref 0.0–0.1)
Basophils Relative: 0 %
Eosinophils Absolute: 0.1 10*3/uL (ref 0.0–0.5)
Eosinophils Relative: 1 %
HCT: 32.1 % — ABNORMAL LOW (ref 36.0–46.0)
Hemoglobin: 10.3 g/dL — ABNORMAL LOW (ref 12.0–15.0)
Immature Granulocytes: 2 %
Lymphocytes Relative: 8 %
Lymphs Abs: 1 10*3/uL (ref 0.7–4.0)
MCH: 35 pg — ABNORMAL HIGH (ref 26.0–34.0)
MCHC: 32.1 g/dL (ref 30.0–36.0)
MCV: 109.2 fL — ABNORMAL HIGH (ref 80.0–100.0)
Monocytes Absolute: 0.6 10*3/uL (ref 0.1–1.0)
Monocytes Relative: 4 %
Neutro Abs: 11.2 10*3/uL — ABNORMAL HIGH (ref 1.7–7.7)
Neutrophils Relative %: 85 %
Platelets: 307 10*3/uL (ref 150–400)
RBC: 2.94 MIL/uL — ABNORMAL LOW (ref 3.87–5.11)
RDW: 15.8 % — ABNORMAL HIGH (ref 11.5–15.5)
WBC: 13.1 10*3/uL — ABNORMAL HIGH (ref 4.0–10.5)
nRBC: 0.2 % (ref 0.0–0.2)

## 2019-04-10 LAB — MAGNESIUM: Magnesium: 1.8 mg/dL (ref 1.7–2.4)

## 2019-04-10 MED ORDER — METOPROLOL TARTRATE 25 MG PO TABS: 50.0000 mg | ORAL_TABLET | Freq: Two times a day (BID) | ORAL | Status: DC

## 2019-04-10 MED ORDER — MAGNESIUM SULFATE 2 GM/50ML IV SOLN
2.0000 g | Freq: Once | INTRAVENOUS | Status: AC
  Administered 2019-04-10: 09:00:00 2 g via INTRAVENOUS
  Filled 2019-04-10: qty 50

## 2019-04-10 MED ORDER — POTASSIUM CHLORIDE 20 MEQ PO PACK
40.0000 meq | PACK | Freq: Once | ORAL | Status: AC
  Administered 2019-04-10: 40 meq via ORAL
  Filled 2019-04-10: qty 2

## 2019-04-10 MED ORDER — METOPROLOL TARTRATE 25 MG PO TABS
25.0000 mg | ORAL_TABLET | Freq: Once | ORAL | Status: DC
  Filled 2019-04-10: qty 1

## 2019-04-10 NOTE — Progress Notes (Signed)
Notified MD of BP of 87/54 (63). Held metoprolol

## 2019-04-10 NOTE — Progress Notes (Signed)
PROGRESS NOTE    April [name]  QDI:[id number] DOB: 04-Feb-1946 DOA: 04/11/2019 PCP: Richmond Campbell., PA-C    Brief Narrative:  73 year old female who presented withrighthip pain and urinary tract symptoms.She does have significant past medical history for hypertension, dyslipidemia, pancreatitis, alcohol abuse, depression, anxiety and GERD. She had a recent left intratrochanteric femur fracture status post intramedullary nail placement March 22, 2019. Patient had several days of right sided abdominal pain, complicated with fever, confusion and severe anorexia. She had generalized weakness, dysuria, urinary frequency and urgency. Apparently patient continued drinks alcohol daily. On her initial physical examination she was febrile 101.9 F,her blood pressure was 91/56, heart rate 93, respiratory rate 23, oxygen saturation 93%, she had dry mucous membranes, her lungswere clear to auscultation bilaterally, heart S1-S2 present and rhythmic, tachycardic, abdomen with positive tenderness and guarding, mainly at the suprapubic region and right lower quadrant,no lower extremity edema.Sodium 132, potassium 3.0, chloride 100, bicarb 21, glucose 277, BUN 32, creatinine 0.63, lipase 82, AST 51, ALT 37,white count 19.5, hemoglobin 9.3, hematocrit 28.7, platelets 329,urinalysis had more than 50 white cells,her chest radiograph had left rotation, positive hyperinflation, no infiltrates. Abdominal CT with acute pancreatitis involving the body and tail of the pancreas. 2.1 cm hypodense fluid collection at TS into the pancreatic tail and posterior fundus of the stomach concerning for pseudocyst. Nonocclusive splenic vein/portal vein thrombosis at the confluence of the splenic vein, superior mesenteric vein and portal vein. EKG 115 bpm, with left axis deviation, normal intervals, sinus rhythm with normalcomplexes, no significant ST segment or T wave changes.  Patient was admitted to the hospital  with a working diagnosis of sepsis due to acute pancreatitis and urinary tract infection.  Gram negative sepsis, complicated with stress induced cardiomyopathy, acute cardiogenic pulmonary edema and acute hypoxic respiratory failure.     Assessment & Plan:   Principal Problem:   Sepsis (HCC) Active Problems:   Acute pancreatitis   UTI (urinary tract infection)   Pancreatic pseudocyst   Portal vein thrombosis   Stress-induced cardiomyopathy   Respiratory failure (HCC) hypoxic and hypercapnic/ acute   1. Sepsis due to urinary tract infection (present on admission)/ Proteus bacteremia (gram negative sepsis).Tolerating well IV  Ciprofloxacin 400 mg bid. WBC now trending down to 13 from 15. She has remained afebrile. Repeat blood cultures are no growth.   2. Acute systolic heart failure, stress induced cardiomyopathy, complicated with acute cardiogenic pulmonary edema and acute hypoxic respiratory failure. Echocardiogram with apical LV hypokinetic with hyperdynamic base, consistent with takotsubo's cardiomyopathy. EF 35 to 40%. Will continue B blockade with metoprolol, HR has been 100, will further titrate metoprolol to 50 mg po bid, continue as needed IV metoprolol for heart rate greater than 130 bpm. Improved volume status, urine output over last 24 H 1,525 ml. Decreased oxygen requirements to 2 LPM, will hold on furosemide for now. Keep MAP greater than 60.   2. Acute pancreatitis complicated with pseudocyst and portal vein thrombosis.Poor oral intake, positive dysphagia, will continue with therapeutic enoxaparin for now. Pain control with IV morphine.   3. Alcohol abuse with potential for withdrawal.Avoid benzodiazepines and sedatives.  4.Hypokalemia,hyponatremia, hypomagnesemia, hyperchloremic non gap metabolic acidosis. Will continue K and Mg correction with Kcl and Mag sulfate.    5. Metabolic encephalopathy with acute hypercapnic respiratory failure. Patient continue to be  very weak and deconditioned will continue with aspiration precautions. Continue thiamine and multivitamins.   DVT prophylaxis:enoxaparin  Code Status:full Family Communication:I spoke with patient's daughter yesterday over  the phone for updates and all questions were addressed.  Body mass index is 17.75 kg/m. Malnutrition Type:  Nutrition Problem: Increased nutrient needs Etiology: acute illness(sepsis, pancreatitis)   Malnutrition Characteristics:  Signs/Symptoms: estimated needs   Nutrition Interventions:  Interventions: Ensure Enlive (each supplement provides 350kcal and 20 grams of protein), Boost Breeze, MVI, Prostat  RN Pressure Injury Documentation:     Consultants:     Procedures:     Antimicrobials:   Ciprofloxacin     Subjective: Patient continue to be very weak and deconditioned, had episodes of confusion last night, no frank agitation. Very poor oral intake. No nausea or vomiting. Persistent abdominal pain.   Objective: Vitals:   04/10/19 0200 04/10/19 0300 04/10/19 0359 04/10/19 0700  BP: 109/65 120/76  104/65  Pulse:  (!) 107  (!) 105  Resp:    (!) 29  Temp:   97.9 F (36.6 C)   TempSrc:   Axillary   SpO2:    99%  Weight:      Height:        Intake/Output Summary (Last 24 hours) at 04/10/2019 0737 Last data filed at 04/10/2019 0320 Gross per 24 hour  Intake 781.37 ml  Output 1525 ml  Net -743.63 ml   Filed Weights   04-13-2019 1528  Weight: 49.9 kg    Examination:   General: deconditioned and ill looking appearing  Neurology: Awake and alert, non focal  E ENT: mild pallor, no icterus, oral mucosa moist Cardiovascular: No JVD. S1-S2 present, rhythmic, no gallops, rubs, or murmurs. Trace lower extremity edema. Pulmonary: vesicular breath sounds bilaterally, adequate air movement, no wheezing, rhonchi or rales. Gastrointestinal. Abdomen mild distended with no organomegaly, no rebound or guarding. Mild tender to deep palpation.   Skin. No rashes Musculoskeletal: no joint deformities     Data Reviewed: I have personally reviewed following labs and imaging studies  CBC: Recent Labs  Lab April 13, 2019 1550 04/06/19 0302 04/07/19 0252 04/08/19 0320 04/09/19 0042 04/10/19 0250  WBC 19.5* 14.0* 8.0 15.4* 15.0* 13.1*  NEUTROABS 18.0*  --   --  13.0* 12.8* 11.2*  HGB 9.3* 9.4* 8.5* 11.7* 11.1* 10.3*  HCT 28.7* 29.8* 26.6* 35.0* 35.1* 32.1*  MCV 110.4* 112.0* 111.3* 106.4* 109.7* 109.2*  PLT 329 270 183 200 264 307   Basic Metabolic Panel: Recent Labs  Lab 2019-04-13 1513  04/06/19 0847 04/07/19 0252 04/08/19 0320 04/09/19 0042 04/10/19 0250  NA  --    < > 139 141 138 140 138  K  --    < > 4.9 3.0* 4.4 2.7* 3.6  CL  --    < > 113* 116* 109 103 99  CO2  --    < > 17* 19* 20* 27 30  GLUCOSE  --    < > 165* 126* 162* 209* 214*  BUN  --    < > CREATININE  --    < > 0.40* 0.33* 0.46 0.36* 0.33*  CALCIUM  --    < > 7.5* 7.2* 8.0* 7.9* 7.6*  MG 2.2  --   --   --   --  1.4* 1.8   < > = values in this interval not displayed.   GFR: Estimated Creatinine Clearance: 50.8 mL/min (A) (by C-G formula based on SCr of 0.33 mg/dL (L)). Liver Function Tests: Recent Labs  Lab 2019-04-13 1550 04/06/19 0302 04/07/19 0252  AST 51* 33 23  ALT 37 29 21  ALKPHOS 215*  200* 152*  BILITOT 0.5 0.5 0.5  PROT 5.3* 5.0* 4.0*  ALBUMIN 2.3* 2.2* 1.6*   Recent Labs  Lab 11-16-19 1550 04/07/19 0252  LIPASE 82* 82*   No results for input(s): AMMONIA in the last 168 hours. Coagulation Profile: No results for input(s): INR, PROTIME in the last 168 hours. Cardiac Enzymes: No results for input(s): CKTOTAL, CKMB, CKMBINDEX, TROPONINI in the last 168 hours. BNP (last 3 results) No results for input(s): PROBNP in the last 8760 hours. HbA1C: No results for input(s): HGBA1C in the last 72 hours. CBG: Recent Labs  Lab 04/06/19 0807 04/06/19 1123 04/06/19 1622 04/06/19 2135 04/07/19 0750  GLUCAP 129* 148*  139* 146* 136*   Lipid Profile: No results for input(s): CHOL, HDL, LDLCALC, TRIG, CHOLHDL, LDLDIRECT in the last 72 hours. Thyroid Function Tests: No results for input(s): TSH, T4TOTAL, FREET4, T3FREE, THYROIDAB in the last 72 hours. Anemia Panel: No results for input(s): VITAMINB12, FOLATE, FERRITIN, TIBC, IRON, RETICCTPCT in the last 72 hours.    Radiology Studies: I have reviewed all of the imaging during this hospital visit personally     Scheduled Meds: . amitriptyline  25 mg Oral QHS  . Chlorhexidine Gluconate Cloth  6 each Topical Daily  . enoxaparin (LOVENOX) injection  50 mg Subcutaneous Q12H  . feeding supplement  1 Container Oral BID BM  . feeding supplement (ENSURE ENLIVE)  237 mL Oral BID BM  . feeding supplement (PRO-STAT SUGAR FREE 64)  30 mL Oral Daily  . folic acid  1 mg Oral Daily  . mouth rinse  15 mL Mouth Rinse BID  . metoprolol tartrate  25 mg Oral BID  . multivitamin with minerals  1 tablet Oral Daily  . nicotine  21 mg Transdermal Daily  . potassium chloride  40 mEq Oral Once  . saccharomyces boulardii  250 mg Oral BID  . thiamine injection  100 mg Intravenous Daily   Continuous Infusions: . ciprofloxacin Stopped (04/09/19 2307)  . magnesium sulfate 1 - 4 g bolus IVPB       LOS: 5 days        Ebonye Reade Annett Gulaaniel Malorie Bigford, MD

## 2019-04-10 NOTE — Progress Notes (Signed)
  Speech Language Pathology Treatment: Dysphagia  Patient Details Name: April [name] MRN: [id number] DOB: 08-17-1946 Today's Date: 04/10/2019 Time: 2130-8657 SLP Time Calculation (min) (ACUTE ONLY): 24 min  Assessment / Plan / Recommendation Clinical Impression  Pt with very poor intake and she states her stomach hurts. She was recently given Zofran and admits currently stomach is hurting more with po intake.  No bowel movement since admit per pt report.  Pt remains grossly weak with weak voice/cough that will decrease her airway protection if she aspirates.  Pt only willing to accept minimal intake of Ensure, Gingerale and saltine crackers. Cognitive based delay suspected with multiple swallows across all consistencies.  Subtle throat clearing noted post=swallow of thin -  Per prior MBS, pt did audibly penetrate liquids producing cough response.  Her CXR 4/26 showed persistent pulmonary infiltrates but improving.  Note MD decreasing benzodiazepines discontinued per MD due to oversedation risk. WBC down to 13.1 from 15.0 and pt is afebrile.  Encouraged her to consume her chocolate Ensure to get nutrition as she states she doesn't want to eat due to her pain.  She needed moderate cues for swallow precautions to mitigate aspiration risk including avoiding mixed consistencies - swallow food first - than liquids to help clear. .  Thanks.    HPI HPI: 73 y.o. female with medical history significant of anxiety, tobacco use, depression, alcohol abuse, GERD, hypertension, hyperlipidemia, pancreatitis, recent left intertrochanteric femur fracture status post intervention with intramedullary nail placement on 03/22/2019 presenting to the hospital for evaluation of hip pain and UTI symptoms      SLP Plan  Continue with current plan of care       Recommendations  Diet recommendations: Dysphagia 3 (mechanical soft);Thin liquid Liquids provided via: Cup;No straw Medication Administration: Whole meds with  puree Supervision: Full supervision/cueing for compensatory strategies;Staff to assist with self feeding Compensations: Slow rate;Small sips/bites Postural Changes and/or Swallow Maneuvers: Seated upright 90 degrees;Upright 30-60 min after meal                Oral Care Recommendations: Oral care BID Follow up Recommendations: (tbd) SLP Visit Diagnosis: Dysphagia, unspecified (R13.10) Plan: Continue with current plan of care       GO                [name] 04/10/2019, 10:33 AM  [name], MS [facility] SLP Acute Rehab Services Pager [phone] Office [phone]

## 2019-04-10 NOTE — Progress Notes (Signed)
Inpatient Diabetes Program Recommendations  AACE/ADA: New Consensus Statement on Inpatient Glycemic Control (2015)  Target Ranges:  Prepandial:   less than 140 mg/dL      Peak postprandial:   less than 180 mg/dL (1-2 hours)      Critically ill patients:  140 - 180 mg/dL   Results for April [name], April [name] (MRN [id number]) as of 04/10/2019 10:59  Ref. Range 04/08/2019 03:20 04/09/2019 00:42 04/10/2019 02:50  Glucose Latest Ref Range: 70 - 99 mg/dL 010 (H) 272 (H) 536 (H)   Review of Glycemic Control  Diabetes history: None  Current orders for Inpatient glycemic control: None  A1c 5.9% on 4/23  Sepsis/UTI Hx Pancreatitis/pancreatic pseudocyst/ETOH use  Inpatient Diabetes Program Recommendations:    Lab glucose elevated in the 200 range. Consider checking CBGs and if elevated consider Novolog 0-9 units tid.  Thanks,  Christena Deem RN, MSN, BC-ADM Inpatient Diabetes Coordinator Team Pager [phone] (8a-5p)

## 2019-04-10 NOTE — Progress Notes (Signed)
Pt has moments of confusion and hallucination.   Pt witnessed reaching into the air outside of the bed. When addressed pt stated she was "reaching for that cup of water", however no water was present.  Pt easily redirectable, still in a pleasant mood.  Will continue to monitor

## 2019-04-11 LAB — BASIC METABOLIC PANEL
Anion gap: 6 (ref 5–15)
BUN: 14 mg/dL (ref 8–23)
CO2: 29 mmol/L (ref 22–32)
Calcium: 7.5 mg/dL — ABNORMAL LOW (ref 8.9–10.3)
Chloride: 103 mmol/L (ref 98–111)
Creatinine, Ser: <0.3 mg/dL — ABNORMAL LOW (ref 0.44–1.00)
Glucose, Bld: 148 mg/dL — ABNORMAL HIGH (ref 70–99)
Potassium: 3.9 mmol/L (ref 3.5–5.1)
Sodium: 138 mmol/L (ref 135–145)

## 2019-04-11 LAB — CBC WITH DIFFERENTIAL/PLATELET
Abs Immature Granulocytes: 0.17 10*3/uL — ABNORMAL HIGH (ref 0.00–0.07)
Basophils Absolute: 0 10*3/uL (ref 0.0–0.1)
Basophils Relative: 0 %
Eosinophils Absolute: 0.2 10*3/uL (ref 0.0–0.5)
Eosinophils Relative: 1 %
HCT: 27.3 % — ABNORMAL LOW (ref 36.0–46.0)
Hemoglobin: 8.8 g/dL — ABNORMAL LOW (ref 12.0–15.0)
Immature Granulocytes: 1 %
Lymphocytes Relative: 8 %
Lymphs Abs: 1 10*3/uL (ref 0.7–4.0)
MCH: 34.4 pg — ABNORMAL HIGH (ref 26.0–34.0)
MCHC: 32.2 g/dL (ref 30.0–36.0)
MCV: 106.6 fL — ABNORMAL HIGH (ref 80.0–100.0)
Monocytes Absolute: 0.6 10*3/uL (ref 0.1–1.0)
Monocytes Relative: 5 %
Neutro Abs: 10.4 10*3/uL — ABNORMAL HIGH (ref 1.7–7.7)
Neutrophils Relative %: 85 %
Platelets: 311 10*3/uL (ref 150–400)
RBC: 2.56 MIL/uL — ABNORMAL LOW (ref 3.87–5.11)
RDW: 15.9 % — ABNORMAL HIGH (ref 11.5–15.5)
WBC: 12.3 10*3/uL — ABNORMAL HIGH (ref 4.0–10.5)
nRBC: 0 % (ref 0.0–0.2)

## 2019-04-11 LAB — GLUCOSE, CAPILLARY
Glucose-Capillary: 109 mg/dL — ABNORMAL HIGH (ref 70–99)
Glucose-Capillary: 261 mg/dL — ABNORMAL HIGH (ref 70–99)
Glucose-Capillary: 98 mg/dL (ref 70–99)
Glucose-Capillary: 164 mg/dL — ABNORMAL HIGH (ref 70–99)

## 2019-04-11 LAB — MAGNESIUM: Magnesium: 1.8 mg/dL (ref 1.7–2.4)

## 2019-04-11 MED ORDER — POLYETHYLENE GLYCOL 3350 17 G PO PACK
17.0000 g | PACK | Freq: Every day | ORAL | Status: DC
  Administered 2019-04-12 – 2019-04-20 (×7): 17 g via ORAL
  Filled 2019-04-11 (×9): qty 1

## 2019-04-11 MED ORDER — SODIUM CHLORIDE 0.9 % IV BOLUS
500.0000 mL | Freq: Once | INTRAVENOUS | Status: AC
  Administered 2019-04-11: 01:00:00 500 mL via INTRAVENOUS

## 2019-04-11 MED ORDER — METOPROLOL TARTRATE 25 MG PO TABS
25.0000 mg | ORAL_TABLET | Freq: Two times a day (BID) | ORAL | Status: DC
  Administered 2019-04-11 – 2019-04-15 (×10): 25 mg via ORAL
  Filled 2019-04-11 (×10): qty 1

## 2019-04-11 MED ORDER — BISACODYL 5 MG PO TBEC
5.0000 mg | DELAYED_RELEASE_TABLET | Freq: Once | ORAL | Status: AC
  Administered 2019-04-11: 5 mg via ORAL
  Filled 2019-04-11: qty 1

## 2019-04-11 MED ORDER — MAGNESIUM SULFATE 2 GM/50ML IV SOLN
2.0000 g | Freq: Once | INTRAVENOUS | Status: AC
  Administered 2019-04-11: 13:00:00 2 g via INTRAVENOUS
  Filled 2019-04-11: qty 50

## 2019-04-11 MED ORDER — MORPHINE SULFATE (PF) 2 MG/ML IV SOLN
2.0000 mg | INTRAVENOUS | Status: DC | PRN
  Administered 2019-04-11 – 2019-04-18 (×12): 2 mg via INTRAVENOUS
  Filled 2019-04-11 (×14): qty 1

## 2019-04-11 MED ORDER — INSULIN ASPART 100 UNIT/ML ~~LOC~~ SOLN
0.0000 [IU] | Freq: Three times a day (TID) | SUBCUTANEOUS | Status: DC
  Administered 2019-04-11: 13:00:00 5 [IU] via SUBCUTANEOUS
  Administered 2019-04-12: 1 [IU] via SUBCUTANEOUS
  Administered 2019-04-12: 11:00:00 5 [IU] via SUBCUTANEOUS
  Administered 2019-04-12: 16:00:00 1 [IU] via SUBCUTANEOUS
  Administered 2019-04-13: 12:00:00 7 [IU] via SUBCUTANEOUS
  Administered 2019-04-13: 17:00:00 3 [IU] via SUBCUTANEOUS
  Administered 2019-04-13: 1 [IU] via SUBCUTANEOUS
  Administered 2019-04-14: 3 [IU] via SUBCUTANEOUS
  Administered 2019-04-14: 2 [IU] via SUBCUTANEOUS
  Administered 2019-04-14: 5 [IU] via SUBCUTANEOUS
  Administered 2019-04-15: 3 [IU] via SUBCUTANEOUS
  Administered 2019-04-15: 2 [IU] via SUBCUTANEOUS
  Administered 2019-04-15 – 2019-04-16 (×2): 3 [IU] via SUBCUTANEOUS
  Administered 2019-04-16: 2 [IU] via SUBCUTANEOUS
  Administered 2019-04-17: 5 [IU] via SUBCUTANEOUS
  Administered 2019-04-17: 3 [IU] via SUBCUTANEOUS
  Administered 2019-04-17 – 2019-04-18 (×2): 1 [IU] via SUBCUTANEOUS
  Administered 2019-04-18: 3 [IU] via SUBCUTANEOUS
  Administered 2019-04-18 – 2019-04-19 (×2): 5 [IU] via SUBCUTANEOUS
  Administered 2019-04-19: 1 [IU] via SUBCUTANEOUS
  Administered 2019-04-19: 2 [IU] via SUBCUTANEOUS
  Administered 2019-04-20: 1 [IU] via SUBCUTANEOUS
  Administered 2019-04-20: 2 [IU] via SUBCUTANEOUS
  Administered 2019-04-21 (×3): 1 [IU] via SUBCUTANEOUS
  Administered 2019-04-22 (×2): 2 [IU] via SUBCUTANEOUS

## 2019-04-11 NOTE — Progress Notes (Signed)
  Speech Language Pathology Treatment:    Patient Details Name: April [name] MRN: [id number] DOB: Nov 17, 1946 Today's Date: 04/11/2019 Time: 8403-7543 SLP Time Calculation (min) (ACUTE ONLY): 13 min  Assessment / Plan / Recommendation Clinical Impression  Pt today continues with left sided abdomen discomfort, she reports this is near her appendix.  RN aware of pt's discomfort and SLP limited activity to within her comfort level.  Fortunately pt admits that she desires to eat/drink at this time.  She consumed a small amount of chocolate Ensure and thin water.    Swallow more timely today than yesterday.  No overt indications of aspiration with po intake however she does have throat clearing immediately post swallow of thin water which does not occur with Ensure.  She has no awareness to this occurrence but concern continues to be present for possible laryngeal penetration.  SLP educated her to monitor her swallowing closely.  She also "washes down" her food from oral cavity into pharynx - which will increase her aspiration risk.    Educated her to aspiration occurring on MBS 07/2018 when taking pill with liquids and "washing down" food is not advised. She required total cues to understand and comply.  Using teach back, pt stated understanding but uncertain she will comply as this strategy is chronic for her.    Advised to start meals with liquids and follow solids that she swallows with liquids to maximize airway protection.  Pt reported some dyspnea today but oxygen saturation was in the 90s and she was not using accessory muscles.  Suspect her pain in her left side is decreasing her ability to breath deeply/adequately.    Will follow up x1 more to assure tolerance and education completed.  Question if pt would benefit from palliative referral given her chronic medical issues and recent recurrent admits.         HPI HPI: 73 y.o. female with medical history significant of anxiety, tobacco use,  depression, alcohol abuse, GERD, hypertension, hyperlipidemia, pancreatitis, recent left intertrochanteric femur fracture status post intervention with intramedullary nail placement on 03/22/2019 presenting to the hospital for evaluation of hip pain and UTI symptoms      SLP Plan  Continue with current plan of care       Recommendations  Diet recommendations: Dysphagia 3 (mechanical soft);Thin liquid(dys3 for energy conservation) Liquids provided via: Cup;No straw Medication Administration: Whole meds with puree Supervision: Full supervision/cueing for compensatory strategies;Staff to assist with self feeding Compensations: Slow rate;Small sips/bites;Other (Comment)(start meals with liquids, keep solids/liquids separate) Postural Changes and/or Swallow Maneuvers: Seated upright 90 degrees;Upright 30-60 min after meal                Oral Care Recommendations: Oral care BID Follow up Recommendations: (tbd) SLP Visit Diagnosis: Dysphagia, unspecified (R13.10) Plan: Continue with current plan of care       GO                [name] 04/11/2019, 10:25 AM  [name], MS [facility] SLP Acute Rehab Services Pager [phone] Office [phone]

## 2019-04-11 NOTE — Progress Notes (Signed)
PROGRESS NOTE    April [name]  ZOX:[id number] DOB: [birth year] DOA: 04/02/2019 PCP: Richmond Campbell., PA-C    Brief Narrative:  72 year old female who presented withrighthip pain and urinary tract symptoms.She does have significant past medical history for hypertension, dyslipidemia, pancreatitis, alcohol abuse, depression, anxiety and GERD. She had a recent left intratrochanteric femur fracture status post intramedullary nail placement March 22, 2019. Patient had several days of right sided abdominal pain, complicated with fever, confusion and severe anorexia. She had generalized weakness, dysuria, urinary frequency and urgency. Apparently patient continued drinks alcohol daily. On her initial physical examination she was febrile 101.9 F,her blood pressure was 91/56, heart rate 93, respiratory rate 23, oxygen saturation 93%, she had dry mucous membranes, her lungswere clear to auscultation bilaterally, heart S1-S2 present and rhythmic, tachycardic, abdomen with positive tenderness and guarding, mainly at the suprapubic region and right lower quadrant,no lower extremity edema.Sodium 132, potassium 3.0, chloride 100, bicarb 21, glucose 277, BUN 32, creatinine 0.63, lipase 82, AST 51, ALT 37,white count 19.5, hemoglobin 9.3, hematocrit 28.7, platelets 329,urinalysis had more than 50 white cells,her chest radiograph had left rotation, positive hyperinflation, no infiltrates. Abdominal CT with acute pancreatitis involving the body and tail of the pancreas. 2.1 cm hypodense fluid collection at TS into the pancreatic tail and posterior fundus of the stomach concerning for pseudocyst. Nonocclusive splenic vein/portal vein thrombosis at the confluence of the splenic vein, superior mesenteric vein and portal vein. EKG 115 bpm, with left axis deviation, normal intervals, sinus rhythm with normalcomplexes, no significant ST segment or T wave changes.  Patient was admitted to the hospital  with a working diagnosis of sepsis due to acute pancreatitis and urinary tract infection.  Gram negative sepsis, complicated with stress induced cardiomyopathy, acute cardiogenic pulmonary edema and acute hypoxic respiratory failure.   Assessment & Plan:   Principal Problem:   Sepsis (HCC) Active Problems:   Acute pancreatitis   UTI (urinary tract infection)   Pancreatic pseudocyst   Portal vein thrombosis   Stress-induced cardiomyopathy   Respiratory failure (HCC) hypoxic and hypercapnic/ acute  1. Sepsis due to urinary tract infection (present on admission)/ Proteus M bacteremia (gram negative sepsis).Contionue  IVCiprofloxacin 400 mg bid (today day #4 of effective antibiotic therapy). She has been afebrile and her wbc continue to trend down, today at 12.3. Follow up cultures continue to be no growth.   2. Acute systolic heart failure, stress induced cardiomyopathy, complicated with acute cardiogenic pulmonary edema and acute hypoxic respiratory failure. Echocardiogram with apical LV hypokinetic with hyperdynamic base, consistent with takotsubo's cardiomyopathy. EF 35 to 40%. Patient has been not getting metoprolol due to low systolic pressure. Will encourage to administer metoprolol, better heart rate control will likely result in improved blood pressure. Continue telemetry monitoring.   2. Acute pancreatitis complicated with pseudocyst and portal vein thrombosis.Soft diet as tolerated, continue to have abdominal pain, will continue IV morphine as needed. No recent bowel movement will add dulcolax and Miralax.  Anticoagulation with enoxaparin, plan to transition to apixaban in the next 24 to 48 H.    3. Alcohol abuse with potential for withdrawal.No further benzodiazepines or sedatives. Patient very sensitive to benzodiazepines.   4.Hypokalemia,hyponatremia,hypomagnesemia,hyperchloremic non gap metabolic acidosis. K this am at 3,9, will continue Mag correction with IV Mg.  Will follow on electrolytes in am. Renal function is stable with serum cr 0,30.   5. Metabolic encephalopathywith acute hypercapnic respiratory failure.She is more awake and alert, no agitation, continue to have episodic confusion.  Will continue thiamine and multivitamins.Avoid sedatives.   6. Unspecified calorie protein malnutrition. Continue nutritional supplements.   7. Hyperglycemia. Patient with no prior diagnosis of diabetes mellitus, will continue insulin sliding scale for glucose cover and monitoring.   DVT prophylaxis:enoxaparin  Code Status:full Family Communication:I spoke with patient's daughter over the phone for updates and all questions were addressed.   Body mass index is 16.23 kg/m. Malnutrition Type:  Nutrition Problem: Increased nutrient needs Etiology: acute illness(sepsis, pancreatitis)   Malnutrition Characteristics:  Signs/Symptoms: estimated needs   Nutrition Interventions:  Interventions: Ensure Enlive (each supplement provides 350kcal and 20 grams of protein), Boost Breeze, MVI, Prostat  RN Pressure Injury Documentation:     Consultants:     Procedures:     Antimicrobials:   IV ciprofloxacin.     Subjective: Patient is feeling fatigued and deconditioned, positive abdominal pain on the left side. No recent bowel movement. She has been tachycardic, not received metoprolol.   Objective: Vitals:   04/11/19 0400 04/11/19 0500 04/11/19 0700 04/11/19 0800  BP: 95/61 92/66 (!) 104/56 (!) 75/47  Pulse: 87 83 90 (!) 103  Resp: (!) 25 (!) 22 (!) 24 17  Temp: 97.6 F (36.4 C)   97.8 F (36.6 C)  TempSrc: Oral   Oral  SpO2: 96% 94% 97% 98%  Weight: 45.6 kg     Height:        Intake/Output Summary (Last 24 hours) at 04/11/2019 1027 Last data filed at 04/11/2019 0401 Gross per 24 hour  Intake 1060.58 ml  Output 626 ml  Net 434.58 ml   Filed Weights   04/13/2019 1528 04/11/19 0400  Weight: 49.9 kg 45.6 kg    Examination:    General: deconditioned and ill looking appearing  Neurology: Awake, mild confusion but not agitation.  E ENT: no pallor, no icterus, oral mucosa moist Cardiovascular: No JVD. S1-S2 present, rhythmic, no gallops, rubs, or murmurs. No lower extremity edema. Pulmonary: decreased breath sounds bilaterally, adequate air movement, no wheezing, rhonchi or rales. Gastrointestinal. Abdomen distended and tender to palpation on the left side, no organomegaly, non tender, no rebound or guarding Skin. No rashes, no ecchymosis.  Musculoskeletal: no joint deformities     Data Reviewed: I have personally reviewed following labs and imaging studies  CBC: Recent Labs  Lab 03/27/2019 1550  04/07/19 0252 04/08/19 0320 04/09/19 0042 04/10/19 0250 04/11/19 0300  WBC 19.5*   < > 8.0 15.4* 15.0* 13.1* 12.3*  NEUTROABS 18.0*  --   --  13.0* 12.8* 11.2* 10.4*  HGB 9.3*   < > 8.5* 11.7* 11.1* 10.3* 8.8*  HCT 28.7*   < > 26.6* 35.0* 35.1* 32.1* 27.3*  MCV 110.4*   < > 111.3* 106.4* 109.7* 109.2* 106.6*  PLT 329   < > 183 200 264 307 311   < > = values in this interval not displayed.   Basic Metabolic Panel: Recent Labs  Lab 03/26/2019 1513  04/07/19 0252 04/08/19 0320 04/09/19 0042 04/10/19 0250 04/11/19 0300  NA  --    < > 141 138 140 138 138  K  --    < > 3.0* 4.4 2.7* 3.6 3.9  CL  --    < > 116* 109 103 99 103  CO2  --    < > 19* 20* 27 30 29   GLUCOSE  --    < > 126* 162* 209* 214* 148*  BUN  --    < > 16 14 13 13  14  CREATININE  --    < > 0.33* 0.46 0.36* 0.33* <0.30*  CALCIUM  --    < > 7.2* 8.0* 7.9* 7.6* 7.5*  MG 2.2  --   --   --  1.4* 1.8 1.8   < > = values in this interval not displayed.   GFR: CrCl cannot be calculated (This lab value cannot be used to calculate CrCl because it is not a number: <0.30). Liver Function Tests: Recent Labs  Lab 04/11/2019 1550 04/06/19 0302 04/07/19 0252  AST 51* 33 23  ALT 37 29 21  ALKPHOS 215* 200* 152*  BILITOT 0.5 0.5 0.5  PROT 5.3* 5.0*  4.0*  ALBUMIN 2.3* 2.2* 1.6*   Recent Labs  Lab 04/09/2019 1550 04/07/19 0252  LIPASE 82* 82*   No results for input(s): AMMONIA in the last 168 hours. Coagulation Profile: No results for input(s): INR, PROTIME in the last 168 hours. Cardiac Enzymes: No results for input(s): CKTOTAL, CKMB, CKMBINDEX, TROPONINI in the last 168 hours. BNP (last 3 results) No results for input(s): PROBNP in the last 8760 hours. HbA1C: No results for input(s): HGBA1C in the last 72 hours. CBG: Recent Labs  Lab 04/06/19 1123 04/06/19 1622 04/06/19 2135 04/07/19 0750 04/11/19 0746  GLUCAP 148* 139* 146* 136* 109*   Lipid Profile: No results for input(s): CHOL, HDL, LDLCALC, TRIG, CHOLHDL, LDLDIRECT in the last 72 hours. Thyroid Function Tests: No results for input(s): TSH, T4TOTAL, FREET4, T3FREE, THYROIDAB in the last 72 hours. Anemia Panel: No results for input(s): VITAMINB12, FOLATE, FERRITIN, TIBC, IRON, RETICCTPCT in the last 72 hours.    Radiology Studies: I have reviewed all of the imaging during this hospital visit personally     Scheduled Meds:  amitriptyline  25 mg Oral QHS   Chlorhexidine Gluconate Cloth  6 each Topical Daily   enoxaparin (LOVENOX) injection  50 mg Subcutaneous Q12H   feeding supplement  1 Container Oral BID BM   feeding supplement (ENSURE ENLIVE)  237 mL Oral BID BM   feeding supplement (PRO-STAT SUGAR FREE 64)  30 mL Oral Daily   folic acid  1 mg Oral Daily   insulin aspart  0-9 Units Subcutaneous TID WC   mouth rinse  15 mL Mouth Rinse BID   metoprolol tartrate  25 mg Oral Once   metoprolol tartrate  25 mg Oral BID   multivitamin with minerals  1 tablet Oral Daily   nicotine  21 mg Transdermal Daily   saccharomyces boulardii  250 mg Oral BID   thiamine injection  100 mg Intravenous Daily   Continuous Infusions:  ciprofloxacin Stopped (04/10/19 2338)     LOS: 6 days        Alexandre Faries Annett Gulaaniel Saber Dickerman, MD

## 2019-04-12 LAB — CBC WITH DIFFERENTIAL/PLATELET
Abs Immature Granulocytes: 0.13 10*3/uL — ABNORMAL HIGH (ref 0.00–0.07)
Basophils Absolute: 0 10*3/uL (ref 0.0–0.1)
Basophils Relative: 0 %
Eosinophils Absolute: 0.2 10*3/uL (ref 0.0–0.5)
Eosinophils Relative: 1 %
HCT: 29.5 % — ABNORMAL LOW (ref 36.0–46.0)
Hemoglobin: 9 g/dL — ABNORMAL LOW (ref 12.0–15.0)
Immature Granulocytes: 1 %
Lymphocytes Relative: 6 %
Lymphs Abs: 0.9 10*3/uL (ref 0.7–4.0)
MCH: 34.4 pg — ABNORMAL HIGH (ref 26.0–34.0)
MCHC: 30.5 g/dL (ref 30.0–36.0)
MCV: 112.6 fL — ABNORMAL HIGH (ref 80.0–100.0)
Monocytes Absolute: 0.7 10*3/uL (ref 0.1–1.0)
Monocytes Relative: 5 %
Neutro Abs: 13.8 10*3/uL — ABNORMAL HIGH (ref 1.7–7.7)
Neutrophils Relative %: 87 %
Platelets: 350 10*3/uL (ref 150–400)
RBC: 2.62 MIL/uL — ABNORMAL LOW (ref 3.87–5.11)
RDW: 16.1 % — ABNORMAL HIGH (ref 11.5–15.5)
WBC: 15.8 10*3/uL — ABNORMAL HIGH (ref 4.0–10.5)
nRBC: 0 % (ref 0.0–0.2)

## 2019-04-12 LAB — GLUCOSE, CAPILLARY
Glucose-Capillary: 145 mg/dL — ABNORMAL HIGH (ref 70–99)
Glucose-Capillary: 266 mg/dL — ABNORMAL HIGH (ref 70–99)
Glucose-Capillary: 147 mg/dL — ABNORMAL HIGH (ref 70–99)
Glucose-Capillary: 214 mg/dL — ABNORMAL HIGH (ref 70–99)

## 2019-04-12 LAB — BASIC METABOLIC PANEL
Anion gap: 7 (ref 5–15)
BUN: 15 mg/dL (ref 8–23)
CO2: 31 mmol/L (ref 22–32)
Calcium: 7.7 mg/dL — ABNORMAL LOW (ref 8.9–10.3)
Chloride: 98 mmol/L (ref 98–111)
Creatinine, Ser: 0.38 mg/dL — ABNORMAL LOW (ref 0.44–1.00)
GFR calc Af Amer: >60 mL/min (ref >60)
GFR calc non Af Amer: >60 mL/min (ref >60)
Glucose, Bld: 226 mg/dL — ABNORMAL HIGH (ref 70–99)
Potassium: 3.8 mmol/L (ref 3.5–5.1)
Sodium: 136 mmol/L (ref 135–145)

## 2019-04-12 MED ORDER — ENSURE ENLIVE PO LIQD
237.0000 mL | Freq: Three times a day (TID) | ORAL | Status: DC
  Administered 2019-04-12 – 2019-04-19 (×18): 237 mL via ORAL

## 2019-04-12 MED ORDER — KETOROLAC TROMETHAMINE 15 MG/ML IJ SOLN
15.0000 mg | Freq: Three times a day (TID) | INTRAMUSCULAR | Status: AC | PRN
  Administered 2019-04-12 – 2019-04-15 (×5): 15 mg via INTRAVENOUS
  Filled 2019-04-12 (×6): qty 1

## 2019-04-12 MED ORDER — VITAMIN B-1 100 MG PO TABS
100.0000 mg | ORAL_TABLET | Freq: Every day | ORAL | Status: DC
  Administered 2019-04-12 – 2019-04-21 (×10): 100 mg via ORAL
  Filled 2019-04-12 (×10): qty 1

## 2019-04-12 NOTE — Progress Notes (Signed)
PROGRESS NOTE    April [name]  ZOX:[id number] DOB: Oct 26, 1946 DOA: Apr 18, 2019 PCP: Richmond Campbell., PA-C   Brief Narrative:  HPI on 2019/04/18 by Dr. John Giovanni April [name] is a 73 y.o. female with medical history significant of anxiety, tobacco use, depression, alcohol abuse, GERD, hypertension, hyperlipidemia, pancreatitis, recent left intertrochanteric femur fracture status post intervention with intramedullary nail placement on 03/22/2019 presenting to the hospital for evaluation of hip pain and UTI symptoms. History provided by patient very limited.  Reports generalized weakness, dysuria, urinary frequency, and urgency.  Denies any flank pain.  She is not able to give me a timeline of when the symptoms started.  Also reports suprapubic abdominal pain and right-sided hip pain.  Not complaining of any left hip pain.  Denies any cough or shortness of breath.  Denies any fevers, chills, sick contacts, recent travel, or exposure to any individual with confirmed COVID-19.  States she drinks 3 shot glasses of whiskey every day.  She is not able to tell me when her last drink was.  Also reports smoking 1 pack of cigarettes every day.  History per ED provider conversation with the patient's daughter: "Daughter states over the last few days she had been complaining that her right side hurt but today was the worst with worsening pain in her right side and abdomen, fever, mild confusion and severe anorexia. Daughter states that since being home from the hospital she has not felt great and has never been a big eater but today was much worse. Unclear when patient's last bowel movement was. During her last hospitalization she did have severe electrolyte abnormalities which was thought to be related to alcohol abuse. Daughter states she is still drinking at home. She typically drinks brandy and drinks approximately 1-3 shot glasses per day. In the past she has had alcohol withdrawal."   Interim history Patient admitted with sepsis secondary to acute pancreatitis and urinary tract infection.  Her hospitalization has been complicated by stress-induced cardiomyopathy, acute cardiogenic pulmonary edema and acute hypoxic respiratory failure.  Assessment & Plan   Sepsis secondary to urinary tract infection/Proteus mirabilis bacteremia -Sepsis was present on admission as patient presented with fever, tachycardia elevated lactic acid -UA showed positive nitrites, large leukocytes,> 50 WBC, many bacteria -Urine culture shows multiple species -CT abdomen pelvis showed acute pancreatitis involving the body and tail of the pancreas.  2.1 m hypodense fluid collection adjacent to the pancreatic tail and posterior fundus of the stomach concerning for pseudocyst -Blood cultures Apr 18, 2019 showed Proteus mirabilis (pansensitive) -Repeat blood cultures 04/09/2019 showed no growth to date -Patient was placed on IV antibiotics and recently completed 7-day course -Currently with leukocytosis, 15.8, mildly elevated from previous day.  However may also be due to acute pancreatitis.  We will continue to monitor CBC closely  Acute systolic heart failure/stress-induced cardiomyopathy with acute cardiogenic pulmonary edema/acute hypoxic and hypercapnic respiratory failure -Chest x-ray showed pulmonary opacities, bilateral pulmonary infiltrates.  Stable pleural effusions -BNP 119.5 -Echocardiogram EF of 35 to 40%.  LV diastolic function cannot be evaluated.  Entire mid to apical region appears hypokinetic with hyperdynamic base. -Currently patient does not appear to be hypervolemic -Monitor intake and output, daily weights -Continue metoprolol with holding parameters given soft BP  Acute pancreatitis complicated with pseudocyst and portal vein thrombosis -CT abdomen pelvis showed nonocclusive splenic vein/portal vein thrombus -Patient initially placed on heparin, currently transition to Lovenox  -Continue pain control, IV morphine as needed.  Will also add on IV  Toradol -Consider transitioning to Eliquis within the next 24 to 48 hours  Alcohol abuse with potential of withdrawal -No further benzodiazepines or sedatives.  Patient has been very sensitive to benzodiazepines -Continue to monitor closely  Macrocytic anemia -likely due to alcoholism -hemoglobin currently stable -continue to CBC  Hypokalemia -Resolved with replacement, continue to monitor BMP and replace as needed  Hypomagnesemia -Resolved with replacement, continue to monitor and replace as needed  Non-anion gap metabolic acidosis with hyponatremia and hypochloremia -Resolved  Hypocalcemia -Likely due to malnutrition and alcoholism -Corrected calcium within normal range  Acute metabolic encephalopathy -Patient currently awake, alert and oriented x3 -Continue thiamine and multivitamins -Avoid sedative medications  Unspecified calorie protein malnutrition -Suspect secondary to alcoholism -Continue supplements  Hyperglycemia -No diagnosis of diabetes mellitus -Hemoglobin A1c 5.9 -Continue insulin sliding scale and CBG monitoring  DVT Prophylaxis Lovenox   Code Status: Full  Family Communication: None at bedside. Daughter via phone. Disposition Plan: Admitted.  Continue to monitor closely in stepdown.  Disposition pending.  Consultants None  Procedures  None  Antibiotics   Anti-infectives (From admission, onward)   Start     Dose/Rate Route Frequency Ordered Stop   04/08/19 1130  ciprofloxacin (CIPRO) IVPB 400 mg  Status:  Discontinued     400 mg 200 mL/hr over 60 Minutes Intravenous Every 12 hours 04/08/19 1110 04/12/19 0821   04/08/19 0900  ciprofloxacin (CIPRO) IVPB 200 mg  Status:  Discontinued     200 mg 100 mL/hr over 60 Minutes Intravenous Every 12 hours 04/08/19 0805 04/08/19 1110   04/07/19 1000  aztreonam (AZACTAM) 2 g in sodium chloride 0.9 % 100 mL IVPB  Status:  Discontinued      2 g 200 mL/hr over 30 Minutes Intravenous Every 8 hours 04/07/19 0809 04/08/19 0805   04/07/19 0800  ciprofloxacin (CIPRO) IVPB 200 mg  Status:  Discontinued     200 mg 100 mL/hr over 60 Minutes Intravenous Every 12 hours 04/07/19 0754 04/07/19 0815   04/06/19 1030  aztreonam (AZACTAM) 2 g in sodium chloride 0.9 % 100 mL IVPB  Status:  Discontinued     2 g 200 mL/hr over 30 Minutes Intravenous Every 8 hours 04/06/19 1008 04/07/19 0754   04/06/19 0500  ciprofloxacin (CIPRO) IVPB 400 mg  Status:  Discontinued     400 mg 200 mL/hr over 60 Minutes Intravenous Every 12 hours 2019/01/12 2043 04/06/19 1008   04/06/19 0400  metroNIDAZOLE (FLAGYL) IVPB 500 mg  Status:  Discontinued     500 mg 100 mL/hr over 60 Minutes Intravenous Every 8 hours 2019/01/12 2031 04/06/19 0826   04/06/19 0200  aztreonam (AZACTAM) 1 g in sodium chloride 0.9 % 100 mL IVPB  Status:  Discontinued     1 g 200 mL/hr over 30 Minutes Intravenous Every 8 hours 2019/01/12 2043 04/06/19 0826   2019/01/12 1745  aztreonam (AZACTAM) 1 g in sodium chloride 0.9 % 100 mL IVPB     1 g 200 mL/hr over 30 Minutes Intravenous  Once 2019/01/12 1741 2019/01/12 1936   2019/01/12 1745  ciprofloxacin (CIPRO) IVPB 400 mg     400 mg 200 mL/hr over 60 Minutes Intravenous  Once 2019/01/12 1741 2019/01/12 2129   2019/01/12 1745  metroNIDAZOLE (FLAGYL) IVPB 500 mg     500 mg 100 mL/hr over 60 Minutes Intravenous  Once 2019/01/12 1741 2019/01/12 2037      Subjective:   April [name] seen and examined today.  Patient states she is not aware while  she is in the hospital.  She denies any current chest pain or shortness of breath.  Continues to have abdominal pain mostly on the left side.  Denies nausea or vomiting, dizziness or headache.  Objective:   Vitals:   04/12/19 0902 04/12/19 0906 04/12/19 0921 04/12/19 1000  BP:    (!) 86/53  Pulse: (!) 108 (!) 107 100 (!) 103  Resp: (!) (!) 21  Temp:      TempSrc:      SpO2: (!) 86% (!) 87% 99% 100%  Weight:       Height:        Intake/Output Summary (Last 24 hours) at 04/12/2019 1040 Last data filed at 04/12/2019 0800 Gross per 24 hour  Intake 929.85 ml  Output 575 ml  Net 354.85 ml   Filed Weights   03/29/2019 1528 04/11/19 0400  Weight: 49.9 kg 45.6 kg    Exam  General: Well developed, chronically ill-appearing, cachectic, NAD  HEENT: NCAT, mucous membranes dry   Cardiovascular: S1 S2 auscultated, tachycardic  Respiratory: Diminished breath sounds however clear.  No wheezing or rhonchi  Abdomen: Soft, nontender, nondistended, + bowel sounds  Extremities: warm dry without cyanosis clubbing or edema  Neuro: AAOx3 (self, place, time, not situation), nonfocal  Psych: Appropriate mood and affect   Data Reviewed: I have personally reviewed following labs and imaging studies  CBC: Recent Labs  Lab 04/08/19 0320 04/09/19 0042 04/10/19 0250 04/11/19 0300 04/12/19 0236  WBC 15.4* 15.0* 13.1* 12.3* 15.8*  NEUTROABS 13.0* 12.8* 11.2* 10.4* 13.8*  HGB 11.7* 11.1* 10.3* 8.8* 9.0*  HCT 35.0* 35.1* 32.1* 27.3* 29.5*  MCV 106.4* 109.7* 109.2* 106.6* 112.6*  PLT 200 264 307 311 350   Basic Metabolic Panel: Recent Labs  Lab 04/06/2019 1513  04/08/19 0320 04/09/19 0042 04/10/19 0250 04/11/19 0300 04/12/19 0236  NA  --    < > 138 140 138 138 136  K  --    < > 4.4 2.7* 3.6 3.9 3.8  CL  --    < > 109 103 99 103 98  CO2  --    < > 20* GLUCOSE  --    < > 162* 209* 214* 148* 226*  BUN  --    < > CREATININE  --    < > 0.46 0.36* 0.33* <0.30* 0.38*  CALCIUM  --    < > 8.0* 7.9* 7.6* 7.5* 7.7*  MG 2.2  --   --  1.4* 1.8 1.8  --    < > = values in this interval not displayed.   GFR: Estimated Creatinine Clearance: 46.4 mL/min (A) (by C-G formula based on SCr of 0.38 mg/dL (L)). Liver Function Tests: Recent Labs  Lab 03/24/2019 1550 04/06/19 0302 04/07/19 0252  AST 51* 33 23  ALT 37 29 21  ALKPHOS 215* 200* 152*  BILITOT 0.5 0.5 0.5  PROT 5.3*  5.0* 4.0*  ALBUMIN 2.3* 2.2* 1.6*   Recent Labs  Lab 04/07/2019 1550 04/07/19 0252  LIPASE 82* 82*   No results for input(s): AMMONIA in the last 168 hours. Coagulation Profile: No results for input(s): INR, PROTIME in the last 168 hours. Cardiac Enzymes: No results for input(s): CKTOTAL, CKMB, CKMBINDEX, TROPONINI in the last 168 hours. BNP (last 3 results) No results for input(s): PROBNP in the last 8760 hours. HbA1C: No results for input(s): HGBA1C in the last 72 hours. CBG: Recent  Labs  Lab 04/11/19 0746 04/11/19 1158 04/11/19 1636 04/11/19 2136 04/12/19 0724  GLUCAP 109* 261* 98 164* 145*   Lipid Profile: No results for input(s): CHOL, HDL, LDLCALC, TRIG, CHOLHDL, LDLDIRECT in the last 72 hours. Thyroid Function Tests: No results for input(s): TSH, T4TOTAL, FREET4, T3FREE, THYROIDAB in the last 72 hours. Anemia Panel: No results for input(s): VITAMINB12, FOLATE, FERRITIN, TIBC, IRON, RETICCTPCT in the last 72 hours. Urine analysis:    Component Value Date/Time   COLORURINE AMBER (A) 03/27/2019 1727   APPEARANCEUR CLOUDY (A) 03/22/2019 1727   LABSPEC 1.017 03/17/2019 1727   PHURINE 7.0 03/23/2019 1727   GLUCOSEU NEGATIVE 03/15/2019 1727   HGBUR MODERATE (A) 03/22/2019 1727   BILIRUBINUR NEGATIVE 04/09/2019 1727   KETONESUR NEGATIVE 03/15/2019 1727   PROTEINUR NEGATIVE 04/01/2019 1727   NITRITE POSITIVE (A) 03/20/2019 1727   LEUKOCYTESUR LARGE (A) 04/12/2019 1727   Sepsis Labs: (procalcitonin:4,lacticidven:4)  ) Recent Results (from the past 240 hour(s))  Blood Culture (routine x 2)     Status: Abnormal   Collection Time: 03/27/2019  3:41 PM  Result Value Ref Range Status   Specimen Description   Final    BLOOD LEFT ANTECUBITAL Performed at [facility], [address], [place], [place] [address]    Special Requests   Final    BOTTLES DRAWN AEROBIC AND ANAEROBIC Blood Culture adequate volume Performed at [facility], [address], [place], [place] [address]    Culture  Setup Time (A)  Final    GRAM VARIABLE ROD IN BOTH AEROBIC AND ANAEROBIC BOTTLES CRITICAL VALUE NOTED.  VALUE IS CONSISTENT WITH PREVIOUSLY REPORTED AND CALLED VALUE.    Culture (A)  Final    PROTEUS MIRABILIS SUSCEPTIBILITIES PERFORMED ON PREVIOUS CULTURE WITHIN THE LAST 5 DAYS. Performed at [facility], [address], [place], [place] [address]    Report Status 04/08/2019 FINAL  Final  Blood Culture (routine x 2)     Status: Abnormal   Collection Time: 03/30/2019  3:41 PM  Result Value Ref Range Status   Specimen Description   Final    BLOOD RIGHT ANTECUBITAL Performed at [facility], [address], [place], [place] [address]    Special Requests   Final    BOTTLES DRAWN AEROBIC AND ANAEROBIC Blood Culture adequate volume Performed at [facility], [address], [place], [place] [address]    Culture  Setup Time (A)  Final    GRAM VARIABLE ROD IN BOTH AEROBIC AND ANAEROBIC BOTTLES CRITICAL RESULT CALLED TO, READ BACK BY AND VERIFIED WITH: Damaris Hippo PharmD 9:35 04/06/19 ([email or web address]) Performed at [facility], [address], [place], [place] [address]    Culture PROTEUS MIRABILIS (A)  Final   Report Status 04/08/2019 FINAL  Final   Organism ID, Bacteria PROTEUS MIRABILIS  Final      Susceptibility   Proteus mirabilis - MIC*    AMPICILLIN <=2 SENSITIVE Sensitive     CEFAZOLIN <=4 SENSITIVE Sensitive     CEFEPIME <=1 SENSITIVE Sensitive     CEFTAZIDIME <=1 SENSITIVE Sensitive     CEFTRIAXONE <=1 SENSITIVE Sensitive     CIPROFLOXACIN <=0.25 SENSITIVE Sensitive     GENTAMICIN <=1 SENSITIVE Sensitive     IMIPENEM 1 SENSITIVE Sensitive     TRIMETH/SULFA <=20 SENSITIVE Sensitive     AMPICILLIN/SULBACTAM <=2 SENSITIVE Sensitive     PIP/TAZO <=4 SENSITIVE Sensitive     * PROTEUS MIRABILIS  Blood Culture ID Panel (Reflexed)     Status: Abnormal   Collection Time:  Apr 07, 2019  3:41 PM  Result Value Ref Range Status   Enterococcus species NOT DETECTED NOT DETECTED Final   Listeria monocytogenes NOT DETECTED NOT DETECTED Final   Staphylococcus species NOT DETECTED NOT DETECTED Final   Staphylococcus aureus (BCID) NOT DETECTED NOT DETECTED Final   Streptococcus species NOT DETECTED NOT DETECTED Final   Streptococcus agalactiae NOT DETECTED NOT DETECTED Final   Streptococcus pneumoniae NOT DETECTED NOT DETECTED Final   Streptococcus pyogenes NOT DETECTED NOT DETECTED Final   Acinetobacter baumannii NOT DETECTED NOT DETECTED Final   Enterobacteriaceae species DETECTED (A) NOT DETECTED Final    Comment: Enterobacteriaceae represent a large family of gram-negative bacteria, not a single organism. CRITICAL RESULT CALLED TO, READ BACK BY AND VERIFIED WITH: Damaris Hippo PharmD 9:35 04/06/19 ([email or web address])    Enterobacter cloacae complex NOT DETECTED NOT DETECTED Final   Escherichia coli NOT DETECTED NOT DETECTED Final   Klebsiella oxytoca NOT DETECTED NOT DETECTED Final   Klebsiella pneumoniae NOT DETECTED NOT DETECTED Final   Proteus species DETECTED (A) NOT DETECTED Final    Comment: CRITICAL RESULT CALLED TO, READ BACK BY AND VERIFIED WITH: Damaris Hippo PharmD 9:35 04/06/19 ([email or web address])    Serratia marcescens NOT DETECTED NOT DETECTED Final   Carbapenem resistance NOT DETECTED NOT DETECTED Final   Haemophilus influenzae NOT DETECTED NOT DETECTED Final   Neisseria meningitidis NOT DETECTED NOT DETECTED Final   Pseudomonas aeruginosa NOT DETECTED NOT DETECTED Final   Candida albicans NOT DETECTED NOT DETECTED Final   Candida glabrata NOT DETECTED NOT DETECTED Final   Candida krusei NOT DETECTED NOT DETECTED Final   Candida parapsilosis NOT DETECTED NOT DETECTED Final   Candida tropicalis NOT DETECTED NOT DETECTED Final    Comment: Performed at [facility], [address], [place], [place] [address]  Urine culture     Status: Abnormal   Collection Time:  07-Apr-2019  5:27 PM  Result Value Ref Range Status   Specimen Description   Final    URINE, CATHETERIZED Performed at [facility], [address], [place], [place] [address]    Special Requests   Final    Normal Performed at [facility], [address] [place], [place] [address]    Culture MULTIPLE SPECIES PRESENT, SUGGEST RECOLLECTION (A)  Final   Report Status 04/06/2019 FINAL  Final  Culture, blood (routine x 2)     Status: None (Preliminary result)   Collection Time: 04/09/19 12:42 AM  Result Value Ref Range Status   Specimen Description   Final    BLOOD LEFT ANTECUBITAL Performed at [facility], [address] [place], [place] [address]    Special Requests   Final    BOTTLES DRAWN AEROBIC ONLY Blood Culture adequate volume Performed at [facility], [address] [place], [place] [address]    Culture   Final    NO GROWTH 2 DAYS Performed at [facility], [address], [place], [place] [address]    Report Status PENDING  Incomplete  Culture, blood (routine x 2)     Status: None (Preliminary result)   Collection Time: 04/09/19 12:42 AM  Result Value Ref Range Status   Specimen Description   Final    BLOOD RIGHT HAND Performed at [facility], [address] [place], [place] [address]    Special Requests   Final  BOTTLES DRAWN AEROBIC ONLY Blood Culture adequate volume Performed at [facility], [address], [place], [place] [address]    Culture   Final    NO GROWTH 2 DAYS Performed at [facility], [address], [place], [place] [address]    Report Status PENDING  Incomplete      Radiology Studies: No results found.   Scheduled Meds: . amitriptyline  25 mg Oral QHS  . Chlorhexidine Gluconate Cloth  6 each Topical Daily  . enoxaparin (LOVENOX) injection  50 mg Subcutaneous Q12H  . feeding supplement  1 Container Oral BID  BM  . feeding supplement (ENSURE ENLIVE)  237 mL Oral BID BM  . feeding supplement (PRO-STAT SUGAR FREE 64)  30 mL Oral Daily  . folic acid  1 mg Oral Daily  . insulin aspart  0-9 Units Subcutaneous TID WC  . mouth rinse  15 mL Mouth Rinse BID  . metoprolol tartrate  25 mg Oral Once  . metoprolol tartrate  25 mg Oral BID  . multivitamin with minerals  1 tablet Oral Daily  . nicotine  21 mg Transdermal Daily  . polyethylene glycol  17 g Oral Daily  . saccharomyces boulardii  250 mg Oral BID  . thiamine  100 mg Oral Daily   Continuous Infusions:   LOS: 7 days   Time Spent in minutes   45 minutes  April [name] D.O. on 04/12/2019 at 10:40 AM  Between 7am to 7pm - Please see pager noted on [email or web address]  After 7pm go to [email or web address]  And look for the night coverage person covering for me after hours  [facility] Office  [phone]

## 2019-04-12 NOTE — Progress Notes (Signed)
ANTICOAGULATION CONSULT NOTE - Follow Up Consult  Pharmacy Consult for heparin --> Eliquis --> Lovenox Indication: portal vein thrombosis  Allergies  Allergen Reactions  . Naltrexone Shortness Of Breath  . Rocephin [Ceftriaxone Sodium In Dextrose] Anaphylaxis  . Penicillins Rash    Has patient had a PCN reaction causing immediate rash, facial/tongue/throat swelling, SOB or lightheadedness with hypotension: Yes immediate rash  Has patient had a PCN reaction causing severe rash involving mucus membranes or skin necrosis: Yes Has patient had a PCN reaction that required hospitalization": No Has patient had a PCN reaction occurring within the last 10 years: Yes If all of the above answers are "NO", then may proceed with Cephalosporin use.     Patient Measurements: Height: 5\' 6"  (167.6 cm) Weight: 100 lb 8.5 oz (45.6 kg) IBW/kg (Calculated) : 59.3 Heparin Dosing Weight: 50 kg  Vital Signs: Temp: 97.8 F (36.6 C) (04/29 0720) Temp Source: Oral (04/29 0720) BP: 109/68 (04/29 0720) Pulse Rate: 99 (04/29 0720)  Labs: Recent Labs    04/10/19 0250 04/11/19 0300 04/12/19 0236  HGB 10.3* 8.8* 9.0*  HCT 32.1* 27.3* 29.5*  PLT 307 311 350  CREATININE 0.33* <0.30* 0.38*    Estimated Creatinine Clearance: 46.4 mL/min (A) (by C-G formula based on SCr of 0.38 mg/dL (L)).   Assessment: Patient's a 73 y.o F s/p recent fall with femur fracture and ORIF on 03/22/19, presented to the ED on 4/22 with c/o fever and hip pain.  Abd CT on 4/22 showed acute pancreatitis and nonocclusive splenic vein/portal vein thrombus. She's currently on full dose lovenox for thrombus.  Today, 04/12/2019: - hgn somewhat stable. plts ok - no bleeding documented - scr low (crcl>30) - dysphagia 3 diet  Goal of Therapy:  Heparin level 0.3-0.7 units/ml Monitor platelets by anticoagulation protocol: Yes   Plan:  - continue lovenox 50 mg SQ q12h - monitor for s/s bleeding  Aislinn Feliz P 04/12/2019,7:28 AM

## 2019-04-12 NOTE — Progress Notes (Signed)
NUTRITION NOTE  Spoke with RN who reports that patient has not been taking much PO but that she does like and requests Ensure supplements. RN reports patient drank 1/2 of an Ensure last night and drank a full one for breakfast this AM. Patient did not want any breakfast this AM. RN plans to offer items for lunch and encourage PO intakes throughout the rest of shift.   Will increase Ensure Enlive from BID to TID today and will continue to follow per protocol.    Trenton Gammon, MS, RD, LDN, [facility] Inpatient Clinical Dietitian Pager # [phone] After hours/weekend pager # [phone]

## 2019-04-13 LAB — GLUCOSE, CAPILLARY
Glucose-Capillary: 124 mg/dL — ABNORMAL HIGH (ref 70–99)
Glucose-Capillary: 328 mg/dL — ABNORMAL HIGH (ref 70–99)
Glucose-Capillary: 227 mg/dL — ABNORMAL HIGH (ref 70–99)
Glucose-Capillary: 150 mg/dL — ABNORMAL HIGH (ref 70–99)
Glucose-Capillary: 209 mg/dL — ABNORMAL HIGH (ref 70–99)

## 2019-04-13 LAB — MAGNESIUM: Magnesium: 2 mg/dL (ref 1.7–2.4)

## 2019-04-13 LAB — CBC
HCT: 26.1 % — ABNORMAL LOW (ref 36.0–46.0)
Hemoglobin: 7.8 g/dL — ABNORMAL LOW (ref 12.0–15.0)
MCH: 33.6 pg (ref 26.0–34.0)
MCHC: 29.9 g/dL — ABNORMAL LOW (ref 30.0–36.0)
MCV: 112.5 fL — ABNORMAL HIGH (ref 80.0–100.0)
Platelets: 355 10*3/uL (ref 150–400)
RBC: 2.32 MIL/uL — ABNORMAL LOW (ref 3.87–5.11)
RDW: 15.9 % — ABNORMAL HIGH (ref 11.5–15.5)
WBC: 13.3 10*3/uL — ABNORMAL HIGH (ref 4.0–10.5)
nRBC: 0 % (ref 0.0–0.2)

## 2019-04-13 LAB — BASIC METABOLIC PANEL
Anion gap: 5 (ref 5–15)
BUN: 18 mg/dL (ref 8–23)
CO2: 33 mmol/L — ABNORMAL HIGH (ref 22–32)
Calcium: 7.9 mg/dL — ABNORMAL LOW (ref 8.9–10.3)
Chloride: 100 mmol/L (ref 98–111)
Creatinine, Ser: 0.34 mg/dL — ABNORMAL LOW (ref 0.44–1.00)
GFR calc Af Amer: >60 mL/min (ref >60)
GFR calc non Af Amer: >60 mL/min (ref >60)
Glucose, Bld: 162 mg/dL — ABNORMAL HIGH (ref 70–99)
Potassium: 3.7 mmol/L (ref 3.5–5.1)
Sodium: 138 mmol/L (ref 135–145)

## 2019-04-13 NOTE — Progress Notes (Signed)
  Speech Language Pathology Treatment: Dysphagia  Patient Details Name: April [name] MRN: [id number] DOB: 10-18-1946 Today's Date: 04/13/2019 Time: 9323-5573 SLP Time Calculation (min) (ACUTE ONLY): 10 min  Assessment / Plan / Recommendation Clinical Impression  RN reports pt's intake is poor.  SLP inquired to pt regarding her swallowing ability and she states "I didn't know I had a problem".  SLP educated patient again to prior history of dysphagia diagnosed August 2019 when in hospital, reviewed her laryngeal penetration with nectar/thin and aspiration with then when taking barium tablet; RN reports pt coughing with coffee this am but tolerating Boost Breeze well.  Pt without awareness to her dysphagia - not aware of coughing this am with intake thus pt likely has low grade chronic aspiration that she is largely able to manage when ambulatory.    SLP reviewed prior MBS video loops and note pt with secretion retention in pharynx that mixed with barium and was subsequently aspirated also; again given pt with poor dentition/?possible decayed - this may increase her risk of aspiration pna.    Reviewed with pt SLP role to mitigate aspiration risk not prevent it fully; will continue to follow breifly and may consider laryngeal elevation/closure exercises as this was her greatest weakness on her prior MBS.   Lung sounds are clear per chart and intake is very poor.  Last CXR 04/09/2019 showed persistent but improving bilateral infiltrates and stable pleural effusions.     Will continue diet as pt appears to be managing and aspiration will not be 100 percent prevented with this pt.    HPI HPI: 73 y.o. female with medical history significant of anxiety, tobacco use, depression, alcohol abuse, GERD, hypertension, hyperlipidemia, pancreatitis, recent left intertrochanteric femur fracture status post intervention with intramedullary nail placement on 03/22/2019 presenting to the hospital for evaluation of hip  pain and UTI symptoms      SLP Plan  Continue with current plan of care       Recommendations  Liquids provided via: Cup;No straw Medication Administration: Whole meds with puree Supervision: Full supervision/cueing for compensatory strategies;Staff to assist with self feeding Compensations: Slow rate;Small sips/bites;Other (Comment)(start meals with liquids, keep solids/liquids separate) Postural Changes and/or Swallow Maneuvers: Seated upright 90 degrees;Upright 30-60 min after meal                Oral Care Recommendations: Oral care BID Follow up Recommendations: (tbd) SLP Visit Diagnosis: Dysphagia, unspecified (R13.10) Plan: Continue with current plan of care       GO                April [name] 04/13/2019, 10:32 AM  April Burnet, MS [facility] SLP Acute Rehab Services Pager [phone] Office [phone]

## 2019-04-13 NOTE — Progress Notes (Signed)
PROGRESS NOTE    April [name]  ALP:[id number] DOB: [birth year] DOA: 2019/04/11 PCP: Richmond Campbell., PA-C   Brief Narrative:  HPI on 2019-04-11 by Dr. John Giovanni April [name] is a 73 y.o. female with medical history significant of anxiety, tobacco use, depression, alcohol abuse, GERD, hypertension, hyperlipidemia, pancreatitis, recent left intertrochanteric femur fracture status post intervention with intramedullary nail placement on 03/22/2019 presenting to the hospital for evaluation of hip pain and UTI symptoms. History provided by patient very limited.  Reports generalized weakness, dysuria, urinary frequency, and urgency.  Denies any flank pain.  She is not able to give me a timeline of when the symptoms started.  Also reports suprapubic abdominal pain and right-sided hip pain.  Not complaining of any left hip pain.  Denies any cough or shortness of breath.  Denies any fevers, chills, sick contacts, recent travel, or exposure to any individual with confirmed COVID-19.  States she drinks 3 shot glasses of whiskey every day.  She is not able to tell me when her last drink was.  Also reports smoking 1 pack of cigarettes every day.  History per ED provider conversation with the patient's daughter: "Daughter states over the last few days she had been complaining that her right side hurt but today was the worst with worsening pain in her right side and abdomen, fever, mild confusion and severe anorexia. Daughter states that since being home from the hospital she has not felt great and has never been a big eater but today was much worse. Unclear when patient's last bowel movement was. During her last hospitalization she did have severe electrolyte abnormalities which was thought to be related to alcohol abuse. Daughter states she is still drinking at home. She typically drinks brandy and drinks approximately 1-3 shot glasses per day. In the past she has had alcohol withdrawal."   Interim history Patient admitted with sepsis secondary to acute pancreatitis and urinary tract infection.  Her hospitalization has been complicated by stress-induced cardiomyopathy, acute cardiogenic pulmonary edema and acute hypoxic respiratory failure.  Assessment & Plan   Sepsis secondary to urinary tract infection/Proteus mirabilis bacteremia -Sepsis was present on admission as patient presented with fever, tachycardia elevated lactic acid -UA showed positive nitrites, large leukocytes,> 50 WBC, many bacteria -Urine culture shows multiple species -CT abdomen pelvis showed acute pancreatitis involving the body and tail of the pancreas.  2.1 m hypodense fluid collection adjacent to the pancreatic tail and posterior fundus of the stomach concerning for pseudocyst -Blood cultures 11-Apr-2019 showed Proteus mirabilis (pansensitive) -Repeat blood cultures 04/09/2019 showed no growth to date -Patient was placed on IV antibiotics and recently completed 7-day course -Leukocytosis improved, 13.3 today with discontinuation of antibiotics on 04/12/2019 -Continue to monitor CBC  Acute systolic heart failure/stress-induced cardiomyopathy with acute cardiogenic pulmonary edema/acute hypoxic and hypercapnic respiratory failure -Chest x-ray showed pulmonary opacities, bilateral pulmonary infiltrates.  Stable pleural effusions -BNP 119.5 -Echocardiogram EF of 35 to 40%.  LV diastolic function cannot be evaluated.  Entire mid to apical region appears hypokinetic with hyperdynamic base. -Currently patient does not appear to be hypervolemic -she does have mild edema in upper ext, will give lasix 20mg  IV x 1 dose -Monitor intake and output, daily weights -Continue metoprolol with holding parameters given soft BP  Acute pancreatitis complicated with pseudocyst and portal vein thrombosis -CT abdomen pelvis showed nonocclusive splenic vein/portal vein thrombus -Gastroenterology consulted and  appreciated-recommended transitioning patient to soft diet with 3 to 6 months of anticoagulation with apixaban.  Continue Florastor probiotic for 2 weeks after completion of antibiotic treatment -Patient initially placed on heparin, currently transition to Lovenox -Continue pain control, IV morphine as needed.  Will also add on IV Toradol -Consider transitioning to Eliquis within the next 24 to 48 hours  Alcohol abuse with potential of withdrawal -No further benzodiazepines or sedatives.  Patient has been very sensitive to benzodiazepines -Continue to monitor closely  Macrocytic anemia -likely due to alcoholism -Hemoglobin 7.8 today -Upon review of patient's chart, hemoglobin has ranged from 8-10 since 2018 -continue to CBC  Hypokalemia -Resolved with replacement, continue to monitor BMP and replace as needed  Hypomagnesemia -Resolved with replacement, continue to monitor and replace as needed  Non-anion gap metabolic acidosis with hyponatremia and hypochloremia -Resolved  Hypocalcemia -Likely due to malnutrition and alcoholism -Corrected calcium within normal range  Acute metabolic encephalopathy -Patient currently awake, alert and oriented x3 -Continue thiamine and multivitamins -Avoid sedative medications  Unspecified calorie protein malnutrition -Suspect secondary to alcoholism -Continue supplements  Hyperglycemia -No diagnosis of diabetes mellitus -Hemoglobin A1c 5.9 -Continue insulin sliding scale and CBG monitoring  Deconditioning -will consult PT and OT  DVT Prophylaxis Lovenox   Code Status: Full  Family Communication: None at bedside. Daughter via phone on 4/29  Disposition Plan: Admitted.  Continue to monitor closely in stepdown.  Disposition pending.   Consultants Gastroenterology  Procedures  None  Antibiotics   Anti-infectives (From admission, onward)   Start     Dose/Rate Route Frequency Ordered Stop   04/08/19 1130  ciprofloxacin (CIPRO)  IVPB 400 mg  Status:  Discontinued     400 mg 200 mL/hr over 60 Minutes Intravenous Every 12 hours 04/08/19 1110 04/12/19 0821   04/08/19 0900  ciprofloxacin (CIPRO) IVPB 200 mg  Status:  Discontinued     200 mg 100 mL/hr over 60 Minutes Intravenous Every 12 hours 04/08/19 0805 04/08/19 1110   04/07/19 1000  aztreonam (AZACTAM) 2 g in sodium chloride 0.9 % 100 mL IVPB  Status:  Discontinued     2 g 200 mL/hr over 30 Minutes Intravenous Every 8 hours 04/07/19 0809 04/08/19 0805   04/07/19 0800  ciprofloxacin (CIPRO) IVPB 200 mg  Status:  Discontinued     200 mg 100 mL/hr over 60 Minutes Intravenous Every 12 hours 04/07/19 0754 04/07/19 0815   04/06/19 1030  aztreonam (AZACTAM) 2 g in sodium chloride 0.9 % 100 mL IVPB  Status:  Discontinued     2 g 200 mL/hr over 30 Minutes Intravenous Every 8 hours 04/06/19 1008 04/07/19 0754   04/06/19 0500  ciprofloxacin (CIPRO) IVPB 400 mg  Status:  Discontinued     400 mg 200 mL/hr over 60 Minutes Intravenous Every 12 hours 01-03-19 2043 04/06/19 1008   04/06/19 0400  metroNIDAZOLE (FLAGYL) IVPB 500 mg  Status:  Discontinued     500 mg 100 mL/hr over 60 Minutes Intravenous Every 8 hours 01-03-19 2031 04/06/19 0826   04/06/19 0200  aztreonam (AZACTAM) 1 g in sodium chloride 0.9 % 100 mL IVPB  Status:  Discontinued     1 g 200 mL/hr over 30 Minutes Intravenous Every 8 hours 01-03-19 2043 04/06/19 0826   01-03-19 1745  aztreonam (AZACTAM) 1 g in sodium chloride 0.9 % 100 mL IVPB     1 g 200 mL/hr over 30 Minutes Intravenous  Once 01-03-19 1741 01-03-19 1936   01-03-19 1745  ciprofloxacin (CIPRO) IVPB 400 mg     400 mg 200 mL/hr over 60 Minutes Intravenous  Once 04-06-19 1741 04-06-19 2129   04/06/2019 1745  metroNIDAZOLE (FLAGYL) IVPB 500 mg     500 mg 100 mL/hr over 60 Minutes Intravenous  Once 06-Apr-2019 1741 Apr 06, 2019 2037      Subjective:   April [name] seen and examined today.  Continues to complain of left-sided abdominal pain.  States that  she has been able to drink some Ensure without pain.  She denies current chest pain, shortness of breath, headache or dizziness.  Objective:   Vitals:   04/13/19 0500 04/13/19 0600 04/13/19 0800 04/13/19 0900  BP: (!) 90/47 (!) 100/51 105/61 97/60  Pulse: 85 93 (!) 103 (!) 113  Resp: (!) Temp:      TempSrc:      SpO2: 96% 94% 97% 96%  Weight: 43.5 kg     Height:        Intake/Output Summary (Last 24 hours) at 04/13/2019 0936 Last data filed at 04/13/2019 0900 Gross per 24 hour  Intake 740 ml  Output 700 ml  Net 40 ml   Filed Weights   2019/04/06 1528 04/11/19 0400 04/13/19 0500  Weight: 49.9 kg 45.6 kg 43.5 kg   Exam  General: Well developed, chronically ill-appearing, cachectic, NAD  HEENT: NCAT,  mucous membranes moist.   Cardiovascular: S1 S2 auscultated, tachycardia   Respiratory: Clear to auscultation bilaterally with equal chest rise  Abdomen: Soft, nontender, nondistended, + bowel sounds  Extremities: warm dry without cyanosis clubbing or edema  Neuro: AAOx3 (self, place, situation, year but not month) nonfocal  Psych: Appropriate mood and affect  Data Reviewed: I have personally reviewed following labs and imaging studies  CBC: Recent Labs  Lab 04/08/19 0320 04/09/19 0042 04/10/19 0250 04/11/19 0300 04/12/19 0236 04/13/19 0300  WBC 15.4* 15.0* 13.1* 12.3* 15.8* 13.3*  NEUTROABS 13.0* 12.8* 11.2* 10.4* 13.8*  --   HGB 11.7* 11.1* 10.3* 8.8* 9.0* 7.8*  HCT 35.0* 35.1* 32.1* 27.3* 29.5* 26.1*  MCV 106.4* 109.7* 109.2* 106.6* 112.6* 112.5*  PLT 200 264 307 311 350 355   Basic Metabolic Panel: Recent Labs  Lab 04/09/19 0042 04/10/19 0250 04/11/19 0300 04/12/19 0236 04/13/19 0300  NA 140 138 138 136 138  K 2.7* 3.6 3.9 3.8 3.7  CL 103 99 103 98 100  CO2 33*  GLUCOSE 209* 214* 148* 226* 162*  BUN CREATININE 0.36* 0.33* <0.30* 0.38* 0.34*  CALCIUM 7.9* 7.6* 7.5* 7.7* 7.9*  MG 1.4* 1.8 1.8  --  2.0    GFR: Estimated Creatinine Clearance: 44.3 mL/min (A) (by C-G formula based on SCr of 0.34 mg/dL (L)). Liver Function Tests: Recent Labs  Lab 04/07/19 0252  AST 23  ALT 21  ALKPHOS 152*  BILITOT 0.5  PROT 4.0*  ALBUMIN 1.6*   Recent Labs  Lab 04/07/19 0252  LIPASE 82*   No results for input(s): AMMONIA in the last 168 hours. Coagulation Profile: No results for input(s): INR, PROTIME in the last 168 hours. Cardiac Enzymes: No results for input(s): CKTOTAL, CKMB, CKMBINDEX, TROPONINI in the last 168 hours. BNP (last 3 results) No results for input(s): PROBNP in the last 8760 hours. HbA1C: No results for input(s): HGBA1C in the last 72 hours. CBG: Recent Labs  Lab 04/12/19 0724 04/12/19 1121 04/12/19 1538 04/12/19 2131 04/13/19 0744  GLUCAP 145* 266* 147* 214* 124*   Lipid Profile: No results for input(s): CHOL, HDL, LDLCALC, TRIG, CHOLHDL, LDLDIRECT in the last 72  hours. Thyroid Function Tests: No results for input(s): TSH, T4TOTAL, FREET4, T3FREE, THYROIDAB in the last 72 hours. Anemia Panel: No results for input(s): VITAMINB12, FOLATE, FERRITIN, TIBC, IRON, RETICCTPCT in the last 72 hours. Urine analysis:    Component Value Date/Time   COLORURINE AMBER (A) 04/04/2019 1727   APPEARANCEUR CLOUDY (A) 03/25/2019 1727   LABSPEC 1.017 03/16/2019 1727   PHURINE 7.0 04/02/2019 1727   GLUCOSEU NEGATIVE 03/26/2019 1727   HGBUR MODERATE (A) 04/13/2019 1727   BILIRUBINUR NEGATIVE 04/08/2019 1727   KETONESUR NEGATIVE 04/10/2019 1727   PROTEINUR NEGATIVE 04/04/2019 1727   NITRITE POSITIVE (A) 03/20/2019 1727   LEUKOCYTESUR LARGE (A) 04/07/2019 1727   Sepsis Labs: (procalcitonin:4,lacticidven:4)  ) Recent Results (from the past 240 hour(s))  Blood Culture (routine x 2)     Status: Abnormal   Collection Time: 04/02/2019  3:41 PM  Result Value Ref Range Status   Specimen Description   Final    BLOOD LEFT ANTECUBITAL Performed at [facility], [address] [place], [place] [address]    Special Requests   Final    BOTTLES DRAWN AEROBIC AND ANAEROBIC Blood Culture adequate volume Performed at [facility], [address] [place], [place] [address]    Culture  Setup Time (A)  Final    GRAM VARIABLE ROD IN BOTH AEROBIC AND ANAEROBIC BOTTLES CRITICAL VALUE NOTED.  VALUE IS CONSISTENT WITH PREVIOUSLY REPORTED AND CALLED VALUE.    Culture (A)  Final    PROTEUS MIRABILIS SUSCEPTIBILITIES PERFORMED ON PREVIOUS CULTURE WITHIN THE LAST 5 DAYS. Performed at [facility], [address], [place], [place] [address]    Report Status 04/08/2019 FINAL  Final  Blood Culture (routine x 2)     Status: Abnormal   Collection Time: 04/03/2019  3:41 PM  Result Value Ref Range Status   Specimen Description   Final    BLOOD RIGHT ANTECUBITAL Performed at [facility], [address] [place], [place] [address]    Special Requests   Final    BOTTLES DRAWN AEROBIC AND ANAEROBIC Blood Culture adequate volume Performed at [facility], [address] [place], [place] [address]    Culture  Setup Time (A)  Final    GRAM VARIABLE ROD IN BOTH AEROBIC AND ANAEROBIC BOTTLES CRITICAL RESULT CALLED TO, READ BACK BY AND VERIFIED WITH: Damaris Hippo PharmD 9:35 04/06/19 ([email or web address]) Performed at [facility], [address], [place], [place] [address]    Culture PROTEUS MIRABILIS (A)  Final   Report Status 04/08/2019 FINAL  Final   Organism ID, Bacteria PROTEUS MIRABILIS  Final      Susceptibility   Proteus mirabilis - MIC*    AMPICILLIN <=2 SENSITIVE Sensitive     CEFAZOLIN <=4 SENSITIVE Sensitive     CEFEPIME <=1 SENSITIVE Sensitive     CEFTAZIDIME <=1 SENSITIVE Sensitive     CEFTRIAXONE <=1 SENSITIVE Sensitive     CIPROFLOXACIN <=0.25 SENSITIVE Sensitive     GENTAMICIN <=1 SENSITIVE Sensitive     IMIPENEM 1 SENSITIVE Sensitive     TRIMETH/SULFA <=20 SENSITIVE Sensitive      AMPICILLIN/SULBACTAM <=2 SENSITIVE Sensitive     PIP/TAZO <=4 SENSITIVE Sensitive     * PROTEUS MIRABILIS  Blood Culture ID Panel (Reflexed)     Status: Abnormal   Collection Time: 04/03/2019  3:41 PM  Result Value Ref Range Status   Enterococcus species NOT DETECTED NOT DETECTED Final   Listeria  monocytogenes NOT DETECTED NOT DETECTED Final   Staphylococcus species NOT DETECTED NOT DETECTED Final   Staphylococcus aureus (BCID) NOT DETECTED NOT DETECTED Final   Streptococcus species NOT DETECTED NOT DETECTED Final   Streptococcus agalactiae NOT DETECTED NOT DETECTED Final   Streptococcus pneumoniae NOT DETECTED NOT DETECTED Final   Streptococcus pyogenes NOT DETECTED NOT DETECTED Final   Acinetobacter baumannii NOT DETECTED NOT DETECTED Final   Enterobacteriaceae species DETECTED (A) NOT DETECTED Final    Comment: Enterobacteriaceae represent a large family of gram-negative bacteria, not a single organism. CRITICAL RESULT CALLED TO, READ BACK BY AND VERIFIED WITH: Damaris Hippo PharmD 9:35 04/06/19 ([email or web address])    Enterobacter cloacae complex NOT DETECTED NOT DETECTED Final   Escherichia coli NOT DETECTED NOT DETECTED Final   Klebsiella oxytoca NOT DETECTED NOT DETECTED Final   Klebsiella pneumoniae NOT DETECTED NOT DETECTED Final   Proteus species DETECTED (A) NOT DETECTED Final    Comment: CRITICAL RESULT CALLED TO, READ BACK BY AND VERIFIED WITH: Damaris Hippo PharmD 9:35 04/06/19 ([email or web address])    Serratia marcescens NOT DETECTED NOT DETECTED Final   Carbapenem resistance NOT DETECTED NOT DETECTED Final   Haemophilus influenzae NOT DETECTED NOT DETECTED Final   Neisseria meningitidis NOT DETECTED NOT DETECTED Final   Pseudomonas aeruginosa NOT DETECTED NOT DETECTED Final   Candida albicans NOT DETECTED NOT DETECTED Final   Candida glabrata NOT DETECTED NOT DETECTED Final   Candida krusei NOT DETECTED NOT DETECTED Final   Candida parapsilosis NOT DETECTED NOT DETECTED Final   Candida  tropicalis NOT DETECTED NOT DETECTED Final    Comment: Performed at [facility], [address], [place], [place] [address]  Urine culture     Status: Abnormal   Collection Time: 03/16/2019  5:27 PM  Result Value Ref Range Status   Specimen Description   Final    URINE, CATHETERIZED Performed at [facility], [address], [place], [place] [address]    Special Requests   Final    Normal Performed at [facility], [address], [place], [place] [address]    Culture MULTIPLE SPECIES PRESENT, SUGGEST RECOLLECTION (A)  Final   Report Status 04/06/2019 FINAL  Final  Culture, blood (routine x 2)     Status: None (Preliminary result)   Collection Time: 04/09/19 12:42 AM  Result Value Ref Range Status   Specimen Description   Final    BLOOD LEFT ANTECUBITAL Performed at [facility], [address] [place], [place] [address]    Special Requests   Final    BOTTLES DRAWN AEROBIC ONLY Blood Culture adequate volume Performed at [facility], [address], [place], [place] [address]    Culture   Final    NO GROWTH 4 DAYS Performed at [facility], [address], [place], [place] [address]    Report Status PENDING  Incomplete  Culture, blood (routine x 2)     Status: None (Preliminary result)   Collection Time: 04/09/19 12:42 AM  Result Value Ref Range Status   Specimen Description   Final    BLOOD RIGHT HAND Performed at [facility], [address] [place], [place] [address]    Special Requests   Final    BOTTLES DRAWN AEROBIC ONLY Blood Culture adequate volume Performed at [facility], [address] [place], [place] [address]    Culture   Final    NO GROWTH 4 DAYS Performed  at [facility] [place], [place] [address]    Report Status PENDING  Incomplete      Radiology Studies: No results found.   Scheduled Meds: .  amitriptyline  25 mg Oral QHS  . Chlorhexidine Gluconate Cloth  6 each Topical Daily  . enoxaparin (LOVENOX) injection  50 mg Subcutaneous Q12H  . feeding supplement  1 Container Oral BID BM  . feeding supplement (ENSURE ENLIVE)  237 mL Oral TID BM  . feeding supplement (PRO-STAT SUGAR FREE 64)  30 mL Oral Daily  . folic acid  1 mg Oral Daily  . insulin aspart  0-9 Units Subcutaneous TID WC  . mouth rinse  15 mL Mouth Rinse BID  . metoprolol tartrate  25 mg Oral Once  . metoprolol tartrate  25 mg Oral BID  . multivitamin with minerals  1 tablet Oral Daily  . nicotine  21 mg Transdermal Daily  . polyethylene glycol  17 g Oral Daily  . saccharomyces boulardii  250 mg Oral BID  . thiamine  100 mg Oral Daily   Continuous Infusions:   LOS: 8 days   Time Spent in minutes   45 minutes  Millie Forde D.O. on 04/13/2019 at 9:36 AM  Between 7am to 7pm - Please see pager noted on [email or web address]  After 7pm go to [email or web address]  And look for the night coverage person covering for me after hours  [facility] Office  [phone]

## 2019-04-13 NOTE — Progress Notes (Signed)
Nutrition Follow-up  RD working remotely.   DOCUMENTATION CODES:   Severe malnutrition in context of chronic illness, Underweight  INTERVENTION:  - continue ensure enlive TID, prostat once/day, and will decrease boost breeze from BID to once/day.  - continue to encourage PO intakes. - will continue to monitor medical course and associated nutrition-related needs.    NUTRITION DIAGNOSIS:   Severe Malnutrition related to chronic illness as evidenced by moderate fat depletion, severe fat depletion, moderate muscle depletion, severe muscle depletion. -revised  GOAL:   Patient will meet greater than or equal to 90% of their needs -unmet  MONITOR:   PO intake, Supplement acceptance, Labs, Weight trends  ASSESSMENT:   73 year old female with past medical hx of HTN, dyslipidemia, pancreatitis, alcohol abuse, depression, anxiety, and GERD. She had a recent L intratrochanteric femur fracture s/p intramedullary nail placement 4/8. She presented to the ED with R hip pain and urinary tract symptoms. Patient also reported several days of R-sided abdominal pain, fever, confusion, and severe feelings of anorexia. Patient stated generalized weakness, dysuria, urinary frequencys and urinary urgency. She continues to drink alcohol daily. Abdominal CT showed acute pancreatitis involving the body and tail of the pancreas and fluid collection concerning for pseudocyst. Patient was admitted with a working diagnosis of sepsis d/t acute pancreatitis and UTI.  Per chart review, weight -2.1 kg/4 lb from 4/28-4/30. Spoke with RN while this RD was onsite yesterday and she reported that patient was primarily only consuming Ensure supplements. RN reported patient drank 1/2 of an Ensure on 4/28 evening and a full one for breakfast yesterday. Ensure had been ordered BID but this RD increased to TID yesterday and patient accepted both bottles offered since that time.   NFPE was able to be completed while RD was on  site yesterday. Patient meets criteria for severe malnutrition. At the time of RD visit to patient's room, she was sleep and awoke to name call x2 and shoulder rub but was unable to keep her eyes open for long and did not provide any information. Patient very drowsy at that time.   Per notes, acute heart failure, stress-induced cardiomyopathy with acute cardiogenic pulmonary edema, acute pancreatitis complicated with pseudocyst and portal vein thrombosis, alcohol abuse with potential for withdrawal, macrocytic anemia, hypokalemia and hypomagnesemia--resolved, metabolic acidosis--resolved, hypocalcemia, acute metabolic encephalopathy--now a/o x3, hyperglycemia with no hx of DM and HgbA1c: 5.9%.  Medications reviewed; 1 mg folvite/day, sliding scale novolog, daily multivitamin with minerals, 17 g miralax/day, 250 mg florastor BID, 100 mg thiamine/day.  Labs reviewed; CBGs: 124 mg/dl, creatinine: 4.78 mg/dl, Ca: 7.9 mg/dl.       NUTRITION - FOCUSED PHYSICAL EXAM:    Most Recent Value  Orbital Region  Moderate depletion  Upper Arm Region  Severe depletion  Thoracic and Lumbar Region  Unable to assess  Buccal Region  Severe depletion  Temple Region  Moderate depletion  Clavicle Bone Region  Severe depletion  Clavicle and Acromion Bone Region  Severe depletion  Scapular Bone Region  Unable to assess  Dorsal Hand  Moderate depletion  Patellar Region  Severe depletion  Anterior Thigh Region  Unable to assess  Posterior Calf Region  Severe depletion  Edema (RD Assessment)  None  Hair  Reviewed  Eyes  Unable to assess  Mouth  Unable to assess  Skin  Reviewed  Nails  Reviewed       Diet Order:   Diet Order            DIET  SOFT Room service appropriate? Yes; Fluid consistency: Thin  Diet effective now              EDUCATION NEEDS:   Not appropriate for education at this time  Skin:  Skin Assessment: Skin Integrity Issues: Skin Integrity Issues:: Incisions Incisions: L hip (4/8;  PTA)  Last BM:  4/29  Height:   Ht Readings from Last 1 Encounters:  03-23-19 5\' 6"  (1.676 m)    Weight:   Wt Readings from Last 1 Encounters:  04/13/19 43.5 kg    Ideal Body Weight:  59.09 kg  BMI:  Body mass index is 15.48 kg/m.  Estimated Nutritional Needs:   Kcal:  1650-1850 kcal  Protein:  75-85 grams  Fluid:  >/= 1.8 L/day     Trenton GammonJessica Byrant Valent, MS, RD, LDN, [facility] Inpatient Clinical Dietitian Pager # [phone] After hours/weekend pager # [phone]

## 2019-04-13 NOTE — Evaluation (Signed)
Physical Therapy Evaluation Patient Details Name: April [name] MRN: [id number] DOB: February 05, 1946 Today's Date: 04/13/2019   History of Present Illness  73 yo female admitted to ED on 4/22 from home, with diagnosis of sepsis secondary to UTI, acute pancreatitis complicated by pancreatic psuedocyst, portal vein thrombosis. Pt additionaly with acute heart failure, acute cardiogenic pulmonary edema, and alcohol withdrawals. Pt recently hospitalized for L hip fracture and ORIF due to falls at home. PMH includes alcohol abuse, HTN, HLD, anxiety, depression, neuropathy.   Clinical Impression   Pt presents with generalized deconditioning, difficulty performing mobility tasks, poor standing balance, and poor activity tolerance. Pt to benefit from acute PT to address deficits. Pt required max assist to squat pivot transfer to recliner this session, as pt could not tolerate standing for >10 seconds. Pt required max verbal encouragement to participate in PT. Pt with stable vital signs during mobility. PT recommending SNF to address mobility deficits at this time. PT to progress mobility as tolerated, and will continue to follow acutely.   BP at rest: 83/42 (53)   BP sitting EOB: 83/50 (60) HR WNL SpO2 on 2LO2: >90%    Follow Up Recommendations SNF;Supervision/Assistance - 24 hour    Equipment Recommendations  None recommended by PT    Recommendations for Other Services       Precautions / Restrictions Precautions Precautions: Fall Restrictions Weight Bearing Restrictions: No Other Position/Activity Restrictions: LLE WBAT per chart review      Mobility  Bed Mobility Overal bed mobility: Needs Assistance Bed Mobility: Rolling;Supine to Sit Rolling: Min assist   Supine to sit: Mod assist;HOB elevated     General bed mobility comments: Min assist for rolling during pericare with RN for completion of sidelying, raising LLE for pericare. Mod assist for supine to sit for trunk elevation, LE  translation to EOB, and scooting to EOB with use of bed pads. Pt reporting dizziness upon first sitting EOB, BP close to baseline upon sitting up and dizziness passed.  Transfers Overall transfer level: Needs assistance Equipment used: Rolling walker (2 wheeled);None Transfers: Sit to/from [employer] Sit to Stand: From elevated surface;Max assist   Squat pivot transfers: Max assist     General transfer comment: Max assist for power up, pelvic and gluteal facilitation, and hand placement once standing. Pt stood for 10 seconds before fatiguing and requiring seated rest. Max assist for squat pivot to drop arm recliner at bedside for lifting and placement in chair. Pt transferred to L.   Ambulation/Gait Ambulation/Gait assistance: (NT - pt unable to stand >10 seconds before fatiguing)              Stairs            Wheelchair Mobility    Modified Rankin (Stroke Patients Only)       Balance Overall balance assessment: Needs assistance;History of Falls Sitting-balance support: Feet supported;Single extremity supported Sitting balance-Leahy Scale: Fair     Standing balance support: Bilateral upper extremity supported Standing balance-Leahy Scale: Poor Standing balance comment: requires UE support to stand                              Pertinent Vitals/Pain Pain Assessment: 0-10 Pain Score: 8  Pain Location: left side Pain Descriptors / Indicators: Sore Pain Intervention(s): Monitored during session;Repositioned;Limited activity within patient's tolerance    Home Living Family/patient expects to be discharged to:: Private residence Living Arrangements: Children Available Help at Discharge:  Family;Available 24 hours/day Type of Home: House Home Access: Stairs to enter Entrance Stairs-Rails: Right;Left;Can reach both Entrance Stairs-Number of Steps: 2 Home Layout: One level Home Equipment: Walker - 2 wheels;Cane - single point;Bedside  commode      Prior Function Level of Independence: Needs assistance   Gait / Transfers Assistance Needed: Pt reports not being able to walk much at all after hip surgery, uses RW post-surgery  ADL's / Homemaking Assistance Needed: Pt states her children live with her, and they "help each other out"; pt states her daughter cooks and cleans. Pt's daughter has a heart condition per pt report.   Comments: RN states pt is questionable historian; pt states she has had one fall in the past 2 years but was recently admitted for multiple falls and hip fracture. Pt also states she had surgery above her pelvis 2 weeks ago, but actually had L hip surgery.      Hand Dominance   Dominant Hand: Right    Extremity/Trunk Assessment   Upper Extremity Assessment Upper Extremity Assessment: Generalized weakness    Lower Extremity Assessment Lower Extremity Assessment: Generalized weakness    Cervical / Trunk Assessment Cervical / Trunk Assessment: Kyphotic  Communication   Communication: No difficulties  Cognition Arousal/Alertness: Awake/alert Behavior During Therapy: WFL for tasks assessed/performed Overall Cognitive Status: No family/caregiver present to determine baseline cognitive functioning Area of Impairment: Orientation;Memory;Following commands;Safety/judgement;Problem solving                 Orientation Level: (Pt oriented x3, unsure of why she is here or what medical care she is receiving)   Memory: Decreased short-term memory Following Commands: Follows one step commands consistently;Follows one step commands with increased time Safety/Judgement: Decreased awareness of safety   Problem Solving: Requires verbal cues;Requires tactile cues;Difficulty sequencing;Slow processing        General Comments      Exercises     Assessment/Plan    PT Assessment Patient needs continued PT services  PT Problem List Decreased strength;Decreased mobility;Decreased safety  awareness;Decreased range of motion;Decreased activity tolerance;Decreased balance;Decreased knowledge of use of DME;Decreased cognition;Cardiopulmonary status limiting activity;Pain       PT Treatment Interventions DME instruction;Functional mobility training;Balance training;Patient/family education;Gait training;Therapeutic activities;Neuromuscular re-education;Therapeutic exercise;Stair training    PT Goals (Current goals can be found in the Care Plan section)  Acute Rehab PT Goals Patient Stated Goal: return to PLOF PT Goal Formulation: With patient Time For Goal Achievement: 04/27/19 Potential to Achieve Goals: Good    Frequency Min 3X/week   Barriers to discharge        Co-evaluation               AM-PAC PT "6 Clicks" Mobility  Outcome Measure Help needed turning from your back to your side while in a flat bed without using bedrails?: A Lot Help needed moving from lying on your back to sitting on the side of a flat bed without using bedrails?: A Lot Help needed moving to and from a bed to a chair (including a wheelchair)?: Total Help needed standing up from a chair using your arms (e.g., wheelchair or bedside chair)?: A Lot Help needed to walk in hospital room?: Total Help needed climbing 3-5 steps with a railing? : Total 6 Click Score: 9    End of Session Equipment Utilized During Treatment: Gait belt;Oxygen(2LO2 during session) Activity Tolerance: Patient limited by fatigue;Patient limited by pain Patient left: in chair;with chair alarm set;with call bell/phone within reach Nurse Communication: Mobility status  PT Visit Diagnosis: Other abnormalities of gait and mobility (R26.89);History of falling (Z91.81);Muscle weakness (generalized) (M62.81);Difficulty in walking, not elsewhere classified (R26.2)    Time: [id number] PT Time Calculation (min) (ACUTE ONLY): 25 min   Charges:   PT Evaluation $PT Eval Low Complexity: 1 Low PT Treatments $Therapeutic  Activity: 8-22 mins       Reegan Mctighe Terrial Rhodes Muaaz Brau, PT Acute Rehabilitation Services Pager [phone]  Office [phone]   Zamira Hickam D Despina Hiddenure 04/13/2019, 6:20 PM

## 2019-04-14 LAB — CBC
HCT: 27.3 % — ABNORMAL LOW (ref 36.0–46.0)
Hemoglobin: 8.3 g/dL — ABNORMAL LOW (ref 12.0–15.0)
MCH: 33.5 pg (ref 26.0–34.0)
MCHC: 30.4 g/dL (ref 30.0–36.0)
MCV: 110.1 fL — ABNORMAL HIGH (ref 80.0–100.0)
Platelets: 393 10*3/uL (ref 150–400)
RBC: 2.48 MIL/uL — ABNORMAL LOW (ref 3.87–5.11)
RDW: 15.5 % (ref 11.5–15.5)
WBC: 11.9 10*3/uL — ABNORMAL HIGH (ref 4.0–10.5)
nRBC: 0 % (ref 0.0–0.2)

## 2019-04-14 LAB — GLUCOSE, CAPILLARY
Glucose-Capillary: 162 mg/dL — ABNORMAL HIGH (ref 70–99)
Glucose-Capillary: 283 mg/dL — ABNORMAL HIGH (ref 70–99)
Glucose-Capillary: 220 mg/dL — ABNORMAL HIGH (ref 70–99)
Glucose-Capillary: 165 mg/dL — ABNORMAL HIGH (ref 70–99)

## 2019-04-14 LAB — CULTURE, BLOOD (ROUTINE X 2)
Culture: NO GROWTH
Culture: NO GROWTH
Special Requests: ADEQUATE
Special Requests: ADEQUATE

## 2019-04-14 MED ORDER — FUROSEMIDE 10 MG/ML IJ SOLN
20.0000 mg | Freq: Once | INTRAMUSCULAR | Status: AC
  Administered 2019-04-14: 20 mg via INTRAVENOUS
  Filled 2019-04-14: qty 2

## 2019-04-14 MED ORDER — CLONAZEPAM 0.5 MG PO TABS
0.5000 mg | ORAL_TABLET | Freq: Two times a day (BID) | ORAL | Status: DC | PRN
  Administered 2019-04-14 – 2019-04-22 (×6): 0.5 mg via ORAL
  Filled 2019-04-14 (×6): qty 1

## 2019-04-14 MED ORDER — APIXABAN 5 MG PO TABS
5.0000 mg | ORAL_TABLET | Freq: Two times a day (BID) | ORAL | Status: DC
  Administered 2019-04-14 – 2019-04-21 (×16): 5 mg via ORAL
  Filled 2019-04-14 (×17): qty 1

## 2019-04-14 NOTE — TOC Initial Note (Signed)
Transition of Care [facility]) - Initial/Assessment Note    Patient Details  Name: April [name] MRN: [id number] Date of Birth: 1946-11-06  Transition of Care [facility]) CM/SW Contact:    Nelwyn Salisbury, LCSW Phone Number: [phone] 04/14/2019, 1:22 PM  Clinical Narrative:  Pt admitted from home where she resides with daughter, being treated for sepsis/UTI, pancreatitis, systolic heart failure.               Had orthopedic surgery recently at [facility] last month and home health services were arranged.  Daughter reports that at baseline pt uses walker and can get OOB independently. Reports that pt can feed self but not prepare food, can do personal hygiene other than showering as pt cannot stand for long. CSW discussed with pt and daughter that SNF is recommended at this point at DC- pt hesitant/resistant. Daughter states decision will be pt's and "I will make it work for her to come home if it comes to that." States that main issue is pt needing assistance transferring out of bed which is major change recently.   Pt states she would want to have [facility] at DC rather than SNF but "will see how I do."  TOC team will follow to assist DC care planning.  Expected Discharge Plan: Home w Home Health Services Barriers to Discharge: Continued Medical Work up   Patient Goals and CMS Choice Patient states their goals for this hospitalization and ongoing recovery are:: "I would want to go home if [daughter] can manage" CMS Medicare.gov Compare Post Acute Care list provided to:: Other (Comment Required)(will provide to patient) Choice offered to / list presented to : NA  Expected Discharge Plan and Services Expected Discharge Plan: Home w Home Health Services In-house Referral: Clinical Social Work   Post Acute Care Choice: Home Health(vs SNF) Living arrangements for the past 2 months: Single Family Home Expected Discharge Date: (unknown)                         HH Arranged: ([facility]        Prior Living Arrangements/Services Living arrangements for the past 2 months: Single Family Home Lives with:: Adult Children Patient language and need for interpreter reviewed:: No Do you feel safe going back to the place where you live?: Yes      Need for Family Participation in Patient Care: Yes (Comment)(lives with daughter who assists with care) Care giver support system in place?: Yes (comment)(daughter, home health) Current home services: DME, Home PT, Home OT Criminal Activity/Legal Involvement Pertinent to Current Situation/Hospitalization: No - Comment as needed  Activities of Daily Living Home Assistive Devices/Equipment: Eyeglasses, Cane (specify quad or straight), Walker (specify type)(single point cane, front wheeled walker) ADL Screening (condition at time of admission) Patient's cognitive ability adequate to safely complete daily activities?: Yes Is the patient deaf or have difficulty hearing?: No Does the patient have difficulty seeing, even when wearing glasses/contacts?: No Does the patient have difficulty concentrating, remembering, or making decisions?: No Patient able to express need for assistance with ADLs?: Yes Does the patient have difficulty dressing or bathing?: Yes Independently performs ADLs?: No Communication: Independent Dressing (OT): Needs assistance Is this a change from baseline?: Pre-admission baseline Grooming: Needs assistance Is this a change from baseline?: Pre-admission baseline Feeding: Needs assistance Is this a change from baseline?: Pre-admission baseline Bathing: Needs assistance Is this a change from baseline?: Pre-admission  baseline Toileting: Dependent Is this a change from baseline?: Change from baseline, expected to last >3days In/Out Bed: Dependent Is this a change from baseline?: Change from baseline, expected to last >3 days Walks in Home:  Dependent Is this a change from baseline?: Change from baseline, expected to last >3 days Does the patient have difficulty walking or climbing stairs?: Yes Weakness of Legs: Both Weakness of Arms/Hands: Both  Permission Sought/Granted Permission sought to share information with : Family Supports    Share Information with NAME: daughter [name] [phone]           Emotional Assessment Appearance:: Appears stated age Attitude/Demeanor/Rapport: Engaged Affect (typically observed): Irritable Orientation: : Oriented to Self, Oriented to Place, Oriented to  Time, Oriented to Situation Alcohol / Substance Use: Alcohol Use(reports drinking whiskey/bourbon daily) Psych Involvement: No (comment)  Admission diagnosis:  Hip pain [M25.559] Acute cystitis without hematuria [N30.00] Alcohol-induced acute pancreatitis, unspecified complication status [K85.20] Sepsis, due to unspecified organism, unspecified whether acute organ dysfunction present [facility](HCC) [A41.9] Patient Active Problem List   Diagnosis Date Noted  . Stress-induced cardiomyopathy 04/09/2019  . Respiratory failure (HCC) hypoxic and hypercapnic/ acute 04/09/2019  . Sepsis (HCC) 03/11/2019  . Acute pancreatitis 03/11/2019  . UTI (urinary tract infection) 03/11/2019  . Pancreatic pseudocyst 03/11/2019  . Portal vein thrombosis 03/11/2019  . Hip fracture (HCC) 03/21/2019  . Displaced intertrochanteric fracture of left femur, initial encounter for closed fracture (HCC)   . Anaphylactic reaction 07/24/2018  . FTT (failure to thrive) in adult 05/18/2018  . Falls 05/18/2018  . Pancreatitis 10/05/2017  . Frequent falls 02/11/2017  . Protein-calorie malnutrition, severe 02/05/2017  . Gallstone pancreatitis 01/30/2017  . Hypokalemia 01/30/2017  . Macrocytic anemia 01/30/2017  . Metabolic acidosis 01/30/2017  . Neuropathy, alcoholic (HCC) 09/08/2016  . Neuropathy 12/11/2015  . Alcohol abuse 12/11/2015  . Tobacco abuse 12/11/2015   . Chronic venous insufficiency 09/13/2015  . Extremity numbness 09/13/2015   PCP:  Richmond CampbellKaplan, Kristen W., PA-C Pharmacy:   [facility] [phone] - [place], [place] - [address] [place] [address] Phone: [phone] Fax: [phone]     Social Determinants of Health (SDOH) Interventions    Readmission Risk Interventions No flowsheet data found.

## 2019-04-14 NOTE — Progress Notes (Signed)
Physical Therapy Treatment Patient Details Name: April [name] MRN: [id number] DOB: January 09, 1946 Today's Date: 04/14/2019    History of Present Illness 73 yo female admitted to ED on 4/22 from home, with diagnosis of sepsis secondary to UTI, acute pancreatitis complicated by pancreatic psuedocyst, portal vein thrombosis. Pt additionaly with acute heart failure, acute cardiogenic pulmonary edema, and alcohol withdrawals. Pt recently hospitalized for L hip fracture and ORIF due to falls at home. PMH includes alcohol abuse, HTN, HLD, anxiety, depression, neuropathy.     PT Comments    Pt with improved tolerance for activity this session, pt limited by fear of falling and deconditioning. Pt able to take small steps to recliner and walk a short distance in room this session. Pt with tachycardia up to 135 bpm during session, recovered to low 100s with rest. PT removed pt O2 for session, and sats stayed >92% on RA, RN notified pt is off supplemental O2. PT introduced abbreviating LE strengthening to pt today, pt limited by LE weakness and fatigue post-ambulation. PT to continue to progress mobility as able, SNF d/c remains appropriate at this time.    Follow Up Recommendations  SNF;Supervision/Assistance - 24 hour     Equipment Recommendations  None recommended by PT    Recommendations for Other Services       Precautions / Restrictions Precautions Precautions: Fall Restrictions Weight Bearing Restrictions: No Other Position/Activity Restrictions: LLE WBAT per chart review    Mobility  Bed Mobility Overal bed mobility: Needs Assistance Bed Mobility: Supine to Sit Rolling: Min assist   Supine to sit: Mod assist;HOB elevated     General bed mobility comments: Min assist for rolling for completion of roll onto pt's R side. Mod assist for supine to sit for LE management, trunk elevation, scooting to EOB. No dizziness noted during session.   Transfers Overall transfer level: Needs  assistance Equipment used: Rolling walker (2 wheeled);1 person hand held assist Transfers: Stand Pivot Transfers;Sit to/from Stand Sit to Stand: Mod assist Stand pivot transfers: Mod assist       General transfer comment: Mod assist for sit to stand for power up, steadying. Pt with anxiety and fear of falling in standing, verbal reassurance from PT throughout standing. Mod assist for transfer to [facility] for guiding pt, steadying, and slow eccentric lowering. Pt with HHA for transfer to toilet, required RW for sit to stand for pericare.   Ambulation/Gait Ambulation/Gait assistance: Mod assist;+2 safety/equipment Gait Distance (Feet): 4 Feet Assistive device: Rolling walker (2 wheeled) Gait Pattern/deviations: Step-through pattern;Decreased stride length;Trunk flexed Gait velocity: decr   General Gait Details: Mod assist +2 for steadying and guiding pt to recliner, verbal cuing for bringing feet within BOS prior to taking steps as pt with heavy posterior leaning and anxiety about falling initially.    Stairs             Wheelchair Mobility    Modified Rankin (Stroke Patients Only)       Balance Overall balance assessment: Needs assistance;History of Falls Sitting-balance support: Feet supported;Single extremity supported Sitting balance-Leahy Scale: Fair     Standing balance support: Bilateral upper extremity supported Standing balance-Leahy Scale: Poor Standing balance comment: requires UE support to stand                             Cognition Arousal/Alertness: Awake/alert Behavior During Therapy: WFL for tasks assessed/performed Overall Cognitive Status: No family/caregiver present to determine baseline cognitive functioning Area  of Impairment: Orientation;Memory;Following commands;Safety/judgement;Problem solving                 Orientation Level: (Pt oriented x4)   Memory: Decreased short-term memory Following Commands: Follows one step commands  consistently;Follows one step commands with increased time Safety/Judgement: Decreased awareness of safety   Problem Solving: Requires verbal cues;Requires tactile cues;Difficulty sequencing;Slow processing        Exercises General Exercises - Lower Extremity Quad Sets: AROM;Both;10 reps;Seated Heel Slides: AROM;Both;5 reps;Seated    General Comments        Pertinent Vitals/Pain Pain Assessment: Faces Faces Pain Scale: Hurts little more Pain Location: generalized, abdomen post-prandial  Pain Descriptors / Indicators: Aching;Discomfort;Grimacing Pain Intervention(s): Monitored during session;Repositioned;Limited activity within patient's tolerance    Home Living                      Prior Function            PT Goals (current goals can now be found in the care plan section) Acute Rehab PT Goals Patient Stated Goal: return to PLOF PT Goal Formulation: With patient Time For Goal Achievement: 04/27/19 Potential to Achieve Goals: Good Progress towards PT goals: Progressing toward goals    Frequency    Min 2X/week      PT Plan Current plan remains appropriate    Co-evaluation              AM-PAC PT "6 Clicks" Mobility   Outcome Measure  Help needed turning from your back to your side while in a flat bed without using bedrails?: A Lot Help needed moving from lying on your back to sitting on the side of a flat bed without using bedrails?: A Lot Help needed moving to and from a bed to a chair (including a wheelchair)?: A Lot Help needed standing up from a chair using your arms (e.g., wheelchair or bedside chair)?: A Lot Help needed to walk in hospital room?: A Lot Help needed climbing 3-5 steps with a railing? : Total 6 Click Score: 11    End of Session Equipment Utilized During Treatment: Gait belt Activity Tolerance: Patient limited by fatigue Patient left: in chair;with chair alarm set;with call bell/phone within reach Nurse Communication:  Mobility status PT Visit Diagnosis: Other abnormalities of gait and mobility (R26.89);History of falling (Z91.81);Muscle weakness (generalized) (M62.81);Difficulty in walking, not elsewhere classified (R26.2)     Time: 1000-1019 PT Time Calculation (min) (ACUTE ONLY): 19 min  Charges:  $Therapeutic Activity: 8-22 mins                     Nekia Maxham Terrial Rhodes Deyonna Fitzsimmons, PT Acute Rehabilitation Services Pager [phone]  Office [phone]    Vester Balthazor D Hoby Kawai 04/14/2019, 11:03 AM

## 2019-04-14 NOTE — Progress Notes (Signed)
PROGRESS NOTE    April PoissonDeborah S [name]  ZHY:[id number] DOB: 04/01/1946 DOA: 04/11/2019 PCP: Richmond CampbellKaplan, Kristen W., PA-C   Brief Narrative:  HPI on 03/18/2019 by Dr. John GiovanniVasundhra Rathore April PoissonDeborah S [name] is a 73 y.o. female with medical history significant of anxiety, tobacco use, depression, alcohol abuse, GERD, hypertension, hyperlipidemia, pancreatitis, recent left intertrochanteric femur fracture status post intervention with intramedullary nail placement on 03/22/2019 presenting to the hospital for evaluation of hip pain and UTI symptoms. History provided by patient very limited.  Reports generalized weakness, dysuria, urinary frequency, and urgency.  Denies any flank pain.  She is not able to give me a timeline of when the symptoms started.  Also reports suprapubic abdominal pain and right-sided hip pain.  Not complaining of any left hip pain.  Denies any cough or shortness of breath.  Denies any fevers, chills, sick contacts, recent travel, or exposure to any individual with confirmed COVID-19.  States she drinks 3 shot glasses of whiskey every day.  She is not able to tell me when her last drink was.  Also reports smoking 1 pack of cigarettes every day.  History per ED provider conversation with the patient's daughter: "Daughter states over the last few days she had been complaining that her right side hurt but today was the worst with worsening pain in her right side and abdomen, fever, mild confusion and severe anorexia. Daughter states that since being home from the hospital she has not felt great and has never been a big eater but today was much worse. Unclear when patient's last bowel movement was. During her last hospitalization she did have severe electrolyte abnormalities which was thought to be related to alcohol abuse. Daughter states she is still drinking at home. She typically drinks brandy and drinks approximately 1-3 shot glasses per day. In the past she has had alcohol withdrawal."   Interim history Patient admitted with sepsis secondary to acute pancreatitis and urinary tract infection.  Her hospitalization has been complicated by stress-induced cardiomyopathy, acute cardiogenic pulmonary edema and acute hypoxic respiratory failure.  Assessment & Plan   Sepsis secondary to urinary tract infection/Proteus mirabilis bacteremia -Sepsis was present on admission as patient presented with fever, tachycardia elevated lactic acid -UA showed positive nitrites, large leukocytes,> 50 WBC, many bacteria -Urine culture shows multiple species -CT abdomen pelvis showed acute pancreatitis involving the body and tail of the pancreas.  2.1 m hypodense fluid collection adjacent to the pancreatic tail and posterior fundus of the stomach concerning for pseudocyst -Blood cultures 03/18/2019 showed Proteus mirabilis (pansensitive) -Repeat blood cultures 04/09/2019 showed no growth to date -Patient was placed on IV antibiotics and recently completed 7-day course -Leukocytosis improved, 11.9  today with discontinuation of antibiotics on 04/12/2019 -Continue to monitor CBC  Acute systolic heart failure/stress-induced cardiomyopathy with acute cardiogenic pulmonary edema/acute hypoxic and hypercapnic respiratory failure -Chest x-ray showed pulmonary opacities, bilateral pulmonary infiltrates.  Stable pleural effusions -BNP 119.5 -Echocardiogram EF of 35 to 40%.  LV diastolic function cannot be evaluated.  Entire mid to apical region appears hypokinetic with hyperdynamic base. -Currently patient does not appear to be hypervolemic -Monitor intake and output, daily weights -Continue metoprolol with holding parameters given soft BP  Acute pancreatitis complicated with pseudocyst and portal vein thrombosis -CT abdomen pelvis showed nonocclusive splenic vein/portal vein thrombus -Gastroenterology consulted and appreciated-recommended transitioning patient to soft diet with 3 to 6 months of  anticoagulation with apixaban.  Continue Florastor probiotic for 2 weeks after completion of antibiotic treatment -Patient initially placed  on heparin, transitioned to Lovenox -Continue pain control, IV morphine, IV toradol PRN -Transitioning to Eliquis later today  Alcohol abuse with potential of withdrawal -No further benzodiazepines or sedatives.  Patient has been very sensitive to benzodiazepines -Continue to monitor closely  Macrocytic anemia -likely due to alcoholism -Hemoglobin 8.3 today -Upon review of patient's chart, hemoglobin has ranged from 8-10 since 2018 -continue to CBC  Hypokalemia -Resolved with replacement, continue to monitor BMP and replace as needed  Hypomagnesemia -Resolved with replacement, continue to monitor and replace as needed  Non-anion gap metabolic acidosis with hyponatremia and hypochloremia -Resolved  Hypocalcemia -Likely due to malnutrition and alcoholism -Corrected calcium within normal range  Acute metabolic encephalopathy -Patient currently awake, alert and oriented x3 -Continue thiamine and multivitamins -Avoid sedative medications  Unspecified calorie protein malnutrition -Suspect secondary to alcoholism -Continue supplements  Hyperglycemia -No diagnosis of diabetes mellitus -Hemoglobin A1c 5.9 -Continue insulin sliding scale and CBG monitoring  Deconditioning PT recommended SNF  DVT Prophylaxis Lovenox   Code Status: Full  Family Communication: None at bedside. Daughter via phone.   Disposition Plan: Admitted.  Continue to monitor closely in stepdown.  Disposition pending.   Consultants Gastroenterology  Procedures  None  Antibiotics   Anti-infectives (From admission, onward)   Start     Dose/Rate Route Frequency Ordered Stop   04/08/19 1130  ciprofloxacin (CIPRO) IVPB 400 mg  Status:  Discontinued     400 mg 200 mL/hr over 60 Minutes Intravenous Every 12 hours 04/08/19 1110 04/12/19 0821   04/08/19 0900   ciprofloxacin (CIPRO) IVPB 200 mg  Status:  Discontinued     200 mg 100 mL/hr over 60 Minutes Intravenous Every 12 hours 04/08/19 0805 04/08/19 1110   04/07/19 1000  aztreonam (AZACTAM) 2 g in sodium chloride 0.9 % 100 mL IVPB  Status:  Discontinued     2 g 200 mL/hr over 30 Minutes Intravenous Every 8 hours 04/07/19 0809 04/08/19 0805   04/07/19 0800  ciprofloxacin (CIPRO) IVPB 200 mg  Status:  Discontinued     200 mg 100 mL/hr over 60 Minutes Intravenous Every 12 hours 04/07/19 0754 04/07/19 0815   04/06/19 1030  aztreonam (AZACTAM) 2 g in sodium chloride 0.9 % 100 mL IVPB  Status:  Discontinued     2 g 200 mL/hr over 30 Minutes Intravenous Every 8 hours 04/06/19 1008 04/07/19 0754   04/06/19 0500  ciprofloxacin (CIPRO) IVPB 400 mg  Status:  Discontinued     400 mg 200 mL/hr over 60 Minutes Intravenous Every 12 hours 04-23-19 2043 04/06/19 1008   04/06/19 0400  metroNIDAZOLE (FLAGYL) IVPB 500 mg  Status:  Discontinued     500 mg 100 mL/hr over 60 Minutes Intravenous Every 8 hours 04/23/2019 2031 04/06/19 0826   04/06/19 0200  aztreonam (AZACTAM) 1 g in sodium chloride 0.9 % 100 mL IVPB  Status:  Discontinued     1 g 200 mL/hr over 30 Minutes Intravenous Every 8 hours 23-Apr-2019 2043 04/06/19 0826   04-23-19 1745  aztreonam (AZACTAM) 1 g in sodium chloride 0.9 % 100 mL IVPB     1 g 200 mL/hr over 30 Minutes Intravenous  Once Apr 23, 2019 1741 04/23/2019 1936   04-23-2019 1745  ciprofloxacin (CIPRO) IVPB 400 mg     400 mg 200 mL/hr over 60 Minutes Intravenous  Once 04/23/19 1741 2019-04-23 2129   04/23/2019 1745  metroNIDAZOLE (FLAGYL) IVPB 500 mg     500 mg 100 mL/hr over 60 Minutes Intravenous  Once  04/10/2019 1741 04/11/2019 2037      Subjective:   April [name] seen and examined today.  Denies to have some abdominal pain but feels that she is improving.  Likes drinking Ensure.  Denies current chest pain, shortness of breath, nausea or vomiting, dizziness or headache.  Patient wanting to get  better so she can go home.  Objective:   Vitals:   04/14/19 0400 04/14/19 0500 04/14/19 0600 04/14/19 0650  BP: (!) 101/49 (!) 103/52 (!) 100/53   Pulse: 98 95 87   Resp: 17 (!) 23 (!) 22   Temp: 98.9 F (37.2 C)     TempSrc: Oral     SpO2: 98% 95% 97%   Weight:    43.5 kg  Height:        Intake/Output Summary (Last 24 hours) at 04/14/2019 4098 Last data filed at 04/14/2019 0500 Gross per 24 hour  Intake 910 ml  Output 350 ml  Net 560 ml   Filed Weights   04/11/19 0400 04/13/19 0500 04/14/19 0650  Weight: 45.6 kg 43.5 kg 43.5 kg    Exam  General: Well developed, chronically ill-appearing, cachectic, NAD  HEENT: NCAT, mucous membranes moist.   Neck: Supple  Cardiovascular: S1 S2 auscultated, RRR  Respiratory: Clear to auscultation bilaterally  Abdomen: Soft, nontender, nondistended, + bowel sounds  Extremities: warm dry without cyanosis clubbing or edema  Neuro: AAOx3, nonfocal  Psych: Pleasant, appropriate mood and affect  Data Reviewed: I have personally reviewed following labs and imaging studies  CBC: Recent Labs  Lab 04/08/19 0320 04/09/19 0042 04/10/19 0250 04/11/19 0300 04/12/19 0236 04/13/19 0300 04/14/19 0248  WBC 15.4* 15.0* 13.1* 12.3* 15.8* 13.3* 11.9*  NEUTROABS 13.0* 12.8* 11.2* 10.4* 13.8*  --   --   HGB 11.7* 11.1* 10.3* 8.8* 9.0* 7.8* 8.3*  HCT 35.0* 35.1* 32.1* 27.3* 29.5* 26.1* 27.3*  MCV 106.4* 109.7* 109.2* 106.6* 112.6* 112.5* 110.1*  PLT 200 264 307 311 350 355 393   Basic Metabolic Panel: Recent Labs  Lab 04/09/19 0042 04/10/19 0250 04/11/19 0300 04/12/19 0236 04/13/19 0300  NA 140 138 138 136 138  K 2.7* 3.6 3.9 3.8 3.7  CL 103 99 103 98 100  CO2 33*  GLUCOSE 209* 214* 148* 226* 162*  BUN CREATININE 0.36* 0.33* <0.30* 0.38* 0.34*  CALCIUM 7.9* 7.6* 7.5* 7.7* 7.9*  MG 1.4* 1.8 1.8  --  2.0   GFR: Estimated Creatinine Clearance: 44.3 mL/min (A) (by C-G formula based on SCr of 0.34  mg/dL (L)). Liver Function Tests: No results for input(s): AST, ALT, ALKPHOS, BILITOT, PROT, ALBUMIN in the last 168 hours. No results for input(s): LIPASE, AMYLASE in the last 168 hours. No results for input(s): AMMONIA in the last 168 hours. Coagulation Profile: No results for input(s): INR, PROTIME in the last 168 hours. Cardiac Enzymes: No results for input(s): CKTOTAL, CKMB, CKMBINDEX, TROPONINI in the last 168 hours. BNP (last 3 results) No results for input(s): PROBNP in the last 8760 hours. HbA1C: No results for input(s): HGBA1C in the last 72 hours. CBG: Recent Labs  Lab 04/13/19 1151 04/13/19 1647 04/13/19 2010 04/13/19 2325 04/14/19 0742  GLUCAP 328* 227* 150* 209* 162*   Lipid Profile: No results for input(s): CHOL, HDL, LDLCALC, TRIG, CHOLHDL, LDLDIRECT in the last 72 hours. Thyroid Function Tests: No results for input(s): TSH, T4TOTAL, FREET4, T3FREE, THYROIDAB in the last 72 hours. Anemia Panel: No results for input(s): VITAMINB12, FOLATE,  FERRITIN, TIBC, IRON, RETICCTPCT in the last 72 hours. Urine analysis:    Component Value Date/Time   COLORURINE AMBER (A) 04-30-19 1727   APPEARANCEUR CLOUDY (A) Apr 30, 2019 1727   LABSPEC 1.017 2019/04/30 1727   PHURINE 7.0 04/30/2019 1727   GLUCOSEU NEGATIVE 04-30-19 1727   HGBUR MODERATE (A) Apr 30, 2019 1727   BILIRUBINUR NEGATIVE 2019/04/30 1727   KETONESUR NEGATIVE 30-Apr-2019 1727   PROTEINUR NEGATIVE Apr 30, 2019 1727   NITRITE POSITIVE (A) 30-Apr-2019 1727   LEUKOCYTESUR LARGE (A) 2019-04-30 1727   Sepsis Labs: (procalcitonin:4,lacticidven:4)  ) Recent Results (from the past 240 hour(s))  Blood Culture (routine x 2)     Status: Abnormal   Collection Time: 04/30/19  3:41 PM  Result Value Ref Range Status   Specimen Description   Final    BLOOD LEFT ANTECUBITAL Performed at [facility], [address] [place], [place] [address]    Special Requests   Final    BOTTLES DRAWN  AEROBIC AND ANAEROBIC Blood Culture adequate volume Performed at [facility], [address], [place], [place] [address]    Culture  Setup Time (A)  Final    GRAM VARIABLE ROD IN BOTH AEROBIC AND ANAEROBIC BOTTLES CRITICAL VALUE NOTED.  VALUE IS CONSISTENT WITH PREVIOUSLY REPORTED AND CALLED VALUE.    Culture (A)  Final    PROTEUS MIRABILIS SUSCEPTIBILITIES PERFORMED ON PREVIOUS CULTURE WITHIN THE LAST 5 DAYS. Performed at [facility], [address] [place], [place] [address]    Report Status 04/08/2019 FINAL  Final  Blood Culture (routine x 2)     Status: Abnormal   Collection Time: 2019/04/30  3:41 PM  Result Value Ref Range Status   Specimen Description   Final    BLOOD RIGHT ANTECUBITAL Performed at [facility], [address], [place], [place] [address]    Special Requests   Final    BOTTLES DRAWN AEROBIC AND ANAEROBIC Blood Culture adequate volume Performed at [facility], [address], [place], [place] [address]    Culture  Setup Time (A)  Final    GRAM VARIABLE ROD IN BOTH AEROBIC AND ANAEROBIC BOTTLES CRITICAL RESULT CALLED TO, READ BACK BY AND VERIFIED WITH: Damaris Hippo PharmD 9:35 04/06/19 ([email or web address]) Performed at [facility], [address], [place], [place] [address]    Culture PROTEUS MIRABILIS (A)  Final   Report Status 04/08/2019 FINAL  Final   Organism ID, Bacteria PROTEUS MIRABILIS  Final      Susceptibility   Proteus mirabilis - MIC*    AMPICILLIN <=2 SENSITIVE Sensitive     CEFAZOLIN <=4 SENSITIVE Sensitive     CEFEPIME <=1 SENSITIVE Sensitive     CEFTAZIDIME <=1 SENSITIVE Sensitive     CEFTRIAXONE <=1 SENSITIVE Sensitive     CIPROFLOXACIN <=0.25 SENSITIVE Sensitive     GENTAMICIN <=1 SENSITIVE Sensitive     IMIPENEM 1 SENSITIVE Sensitive     TRIMETH/SULFA <=20 SENSITIVE Sensitive     AMPICILLIN/SULBACTAM <=2 SENSITIVE Sensitive     PIP/TAZO <=4 SENSITIVE Sensitive     *  PROTEUS MIRABILIS  Blood Culture ID Panel (Reflexed)     Status: Abnormal   Collection Time: 30-Apr-2019  3:41 PM  Result Value Ref Range Status   Enterococcus species NOT DETECTED NOT DETECTED Final   Listeria monocytogenes NOT DETECTED NOT DETECTED Final   Staphylococcus species NOT DETECTED NOT DETECTED Final   Staphylococcus aureus (BCID) NOT DETECTED NOT DETECTED Final  Streptococcus species NOT DETECTED NOT DETECTED Final   Streptococcus agalactiae NOT DETECTED NOT DETECTED Final   Streptococcus pneumoniae NOT DETECTED NOT DETECTED Final   Streptococcus pyogenes NOT DETECTED NOT DETECTED Final   Acinetobacter baumannii NOT DETECTED NOT DETECTED Final   Enterobacteriaceae species DETECTED (A) NOT DETECTED Final    Comment: Enterobacteriaceae represent a large family of gram-negative bacteria, not a single organism. CRITICAL RESULT CALLED TO, READ BACK BY AND VERIFIED WITH: Damaris Hippo PharmD 9:35 04/06/19 ([email or web address])    Enterobacter cloacae complex NOT DETECTED NOT DETECTED Final   Escherichia coli NOT DETECTED NOT DETECTED Final   Klebsiella oxytoca NOT DETECTED NOT DETECTED Final   Klebsiella pneumoniae NOT DETECTED NOT DETECTED Final   Proteus species DETECTED (A) NOT DETECTED Final    Comment: CRITICAL RESULT CALLED TO, READ BACK BY AND VERIFIED WITH: Damaris Hippo PharmD 9:35 04/06/19 ([email or web address])    Serratia marcescens NOT DETECTED NOT DETECTED Final   Carbapenem resistance NOT DETECTED NOT DETECTED Final   Haemophilus influenzae NOT DETECTED NOT DETECTED Final   Neisseria meningitidis NOT DETECTED NOT DETECTED Final   Pseudomonas aeruginosa NOT DETECTED NOT DETECTED Final   Candida albicans NOT DETECTED NOT DETECTED Final   Candida glabrata NOT DETECTED NOT DETECTED Final   Candida krusei NOT DETECTED NOT DETECTED Final   Candida parapsilosis NOT DETECTED NOT DETECTED Final   Candida tropicalis NOT DETECTED NOT DETECTED Final    Comment: Performed at [facility],  [address], [place], [place] [address]  Urine culture     Status: Abnormal   Collection Time: 04-22-2019  5:27 PM  Result Value Ref Range Status   Specimen Description   Final    URINE, CATHETERIZED Performed at [facility], [address] [place], [place] [address]    Special Requests   Final    Normal Performed at [facility], [address] [place], [place] [address]    Culture MULTIPLE SPECIES PRESENT, SUGGEST RECOLLECTION (A)  Final   Report Status 04/06/2019 FINAL  Final  Culture, blood (routine x 2)     Status: None (Preliminary result)   Collection Time: 04/09/19 12:42 AM  Result Value Ref Range Status   Specimen Description   Final    BLOOD LEFT ANTECUBITAL Performed at [facility], [address] [place], [place] [address]    Special Requests   Final    BOTTLES DRAWN AEROBIC ONLY Blood Culture adequate volume Performed at [facility], [address], [place], [place] [address]    Culture   Final    NO GROWTH 4 DAYS Performed at [facility], [address] [place], [place] [address]    Report Status PENDING  Incomplete  Culture, blood (routine x 2)     Status: None (Preliminary result)   Collection Time: 04/09/19 12:42 AM  Result Value Ref Range Status   Specimen Description   Final    BLOOD RIGHT HAND Performed at [facility], [address], [place], [place] [address]    Special Requests   Final    BOTTLES DRAWN AEROBIC ONLY Blood Culture adequate volume Performed at [facility], [address], [place], [place] [address]    Culture   Final    NO GROWTH 4 DAYS Performed at [facility], [address], [place], [place] [address]    Report Status PENDING  Incomplete      Radiology Studies:  No results found.   Scheduled Meds: . amitriptyline  25 mg Oral QHS  . Chlorhexidine Gluconate Cloth  6 each Topical Daily  .  enoxaparin (LOVENOX) injection  50 mg Subcutaneous Q12H  . feeding supplement  1 Container Oral BID BM  . feeding supplement (ENSURE ENLIVE)  237 mL Oral TID BM  . feeding supplement (PRO-STAT SUGAR FREE 64)  30 mL Oral Daily  . folic acid  1 mg Oral Daily  . insulin aspart  0-9 Units Subcutaneous TID WC  . mouth rinse  15 mL Mouth Rinse BID  . metoprolol tartrate  25 mg Oral Once  . metoprolol tartrate  25 mg Oral BID  . multivitamin with minerals  1 tablet Oral Daily  . nicotine  21 mg Transdermal Daily  . polyethylene glycol  17 g Oral Daily  . saccharomyces boulardii  250 mg Oral BID  . thiamine  100 mg Oral Daily   Continuous Infusions:   LOS: 9 days   Time Spent in minutes   30 minutes  Jamilla Galli D.O. on 04/14/2019 at 8:22 AM  Between 7am to 7pm - Please see pager noted on [email or web address]  After 7pm go to [email or web address]  And look for the night coverage person covering for me after hours  [facility] Office  [phone]

## 2019-04-14 NOTE — Evaluation (Signed)
Occupational Therapy Evaluation Patient Details Name: April [name] MRN: [id number] DOB: 01-15-1946 Today's Date: 04/14/2019    History of Present Illness 73 yo female admitted to ED on 4/22 from home, with diagnosis of sepsis secondary to UTI, acute pancreatitis complicated by pancreatic psuedocyst, portal vein thrombosis. Pt additionaly with acute heart failure, acute cardiogenic pulmonary edema, and alcohol withdrawals. Pt recently hospitalized for L hip fracture and ORIF due to falls at home. PMH includes alcohol abuse, HTN, HLD, anxiety, depression, neuropathy.    Clinical Impression   Pt was admitted for the above. She was seen by this OT a few weeks ago after fall resulting in L hip fx. Pt's hip is no longer painful, and she wasn't sure which hip was repaired.  Pt demonstrated decreased memory and contradicted herself in where she wanted to end up (bed vs chair).  Unsure of how much daughter helped her at home, but I suspect she needed quite a bit of help for adls. Will follow in acute setting with min A level goals.     Follow Up Recommendations  Supervision/Assistance - 24 hour    Equipment Recommendations  None recommended by OT    Recommendations for Other Services       Precautions / Restrictions Precautions Precautions: Fall Restrictions Weight Bearing Restrictions: No Other Position/Activity Restrictions: LLE WBAT per chart review      Mobility Bed Mobility Overal bed mobility: Needs Assistance Bed Mobility: Supine to Sit Rolling: Min assist   Supine to sit: Mod assist;HOB elevated     General bed mobility comments: oob  Transfers Overall transfer level: Needs assistance Equipment used: 1 person hand held assist Transfers: Stand Pivot Transfers;Sit to/from Stand Sit to Stand: Mod assist Stand pivot transfers: Mod assist       General transfer comment: assist to power up and stabilize.  RN placed pillows under pt when OT assisted her to stand due to  buttocks discomfort    Balance Overall balance assessment: Needs assistance;History of Falls Sitting-balance support: Feet supported;Single extremity supported Sitting balance-Leahy Scale: Fair     Standing balance support: Bilateral upper extremity supported Standing balance-Leahy Scale: Poor Standing balance comment: requires UE support to stand                            ADL either performed or assessed with clinical judgement   ADL Overall ADL's : Needs assistance/impaired Eating/Feeding: Independent   Grooming: Set up;Supervision/safety   Upper Body Bathing: Set up;Supervision/ safety   Lower Body Bathing: Maximal assistance;Sit to/from stand   Upper Body Dressing : Minimal assistance   Lower Body Dressing: Total assistance;Sit to/from stand                 General ADL Comments: pt doesn't have hip pain but c/o abdominal discomfort when reaching     Vision         Perception     Praxis      Pertinent Vitals/Pain Pain Assessment: Faces Faces Pain Scale: Hurts little more Pain Location: abdomen and buttocks Pain Descriptors / Indicators: Discomfort Pain Intervention(s): Limited activity within patient's tolerance;Monitored during session;Repositioned     Hand Dominance     Extremity/Trunk Assessment Upper Extremity Assessment Upper Extremity Assessment: Generalized weakness           Communication Communication Communication: No difficulties   Cognition Arousal/Alertness: Awake/alert Behavior During Therapy: WFL for tasks assessed/performed Overall Cognitive Status: No family/caregiver present to determine  baseline cognitive functioning Area of Impairment: Orientation;Memory;Following commands;Safety/judgement;Problem solving                 Orientation Level: (Pt oriented x4)   Memory: Decreased short-term memory Following Commands: Follows one step commands consistently;Follows one step commands with increased  time Safety/Judgement: Decreased awareness of safety   Problem Solving: Requires verbal cues;Requires tactile cues;Difficulty sequencing;Slow processing General Comments: pt asked same questions repeatedly.  Contradicted herself saying she wanted to stay up in chair, then back in bed a couple of times each.  Kept her up in chair with pilows placed under her and behind her.   General Comments       Exercises    Shoulder Instructions      Home Living Family/patient expects to be discharged to:: Private residence Living Arrangements: Children Available Help at Discharge: Family;Available 24 hours/day               Bathroom Shower/Tub: Chief Strategy OfficerTub/shower unit   Bathroom Toilet: Standard     Home Equipment: Environmental consultantWalker - 2 wheels;Cane - single point;Bedside commode          Prior Functioning/Environment          Comments: pt unreliable about history; decreased memory.  Anticipate daughter assisted with adls        OT Problem List: Decreased strength;Decreased activity tolerance;Decreased cognition;Decreased safety awareness;Pain;Decreased knowledge of use of DME or AE      OT Treatment/Interventions: Self-care/ADL training;DME and/or AE instruction;Patient/family education;Therapeutic activities    OT Goals(Current goals can be found in the care plan section) Acute Rehab OT Goals Patient Stated Goal: return to PLOF OT Goal Formulation: With patient Time For Goal Achievement: 04/28/19 Potential to Achieve Goals: Good ADL Goals Pt Will Perform Grooming: with min assist;standing Pt Will Transfer to Toilet: with min assist;bedside commode;stand pivot transfer Pt Will Perform Toileting - Clothing Manipulation and hygiene: with mod assist;sit to/from stand Additional ADL Goal #1: pt will only need one safety cue during sit to stand and transfer to 3:1 commode  OT Frequency: Min 2X/week   Barriers to D/C:            Co-evaluation              AM-PAC OT "6 Clicks"  Daily Activity     Outcome Measure Help from another person eating meals?: None Help from another person taking care of personal grooming?: A Little Help from another person toileting, which includes using toliet, bedpan, or urinal?: A Lot Help from another person bathing (including washing, rinsing, drying)?: A Lot Help from another person to put on and taking off regular upper body clothing?: A Little Help from another person to put on and taking off regular lower body clothing?: Total 6 Click Score: 15   End of Session    Activity Tolerance: Patient limited by fatigue Patient left: in chair;with call bell/phone within reach;with chair alarm set  OT Visit Diagnosis: Muscle weakness (generalized) (M62.81)                Time: [id number] OT Time Calculation (min): 23 min Charges:  OT General Charges $OT Visit: 1 Visit OT Evaluation $OT Eval Low Complexity: 1 Low  Marica OtterMaryellen Tildon Silveria, OTR/L Acute Rehabilitation Services [phone] [facility] pager [phone] office 04/14/2019  Euleta Belson 04/14/2019, 12:36 PM

## 2019-04-14 NOTE — Progress Notes (Signed)
Pt becoming more forgetful and agitated. Pt continues to call out and say that she has not been told "what is going on" or "why she is here". I continue to explain and educate her in regards to her treatment and goals. The pt will verbalize understanding, and then just a few minutes later, again state she has not been told anything.  I spoke with pt's daughter to explain the situation and educate the daughter on the course of treatment. I also made it clear that we have been educating the pt and explaining all forms of treatment. The daughter understood and acknowledged that her mom seemed more forgetful.   I notified MD of pt status and spoke with MD over the phone. Will continue to monitor and update with any further changes

## 2019-04-14 NOTE — Progress Notes (Signed)
ANTICOAGULATION CONSULT NOTE - Follow Up Consult  Pharmacy Consult for heparin --> Eliquis --> Lovenox--> Eliquis Indication: portal vein thrombosis  Allergies  Allergen Reactions  . Naltrexone Shortness Of Breath  . Rocephin [Ceftriaxone Sodium In Dextrose] Anaphylaxis  . Penicillins Rash    Has patient had a PCN reaction causing immediate rash, facial/tongue/throat swelling, SOB or lightheadedness with hypotension: Yes immediate rash  Has patient had a PCN reaction causing severe rash involving mucus membranes or skin necrosis: Yes Has patient had a PCN reaction that required hospitalization": No Has patient had a PCN reaction occurring within the last 10 years: Yes If all of the above answers are "NO", then may proceed with Cephalosporin use.     Patient Measurements: Height: 5\' 6"  (167.6 cm) Weight: 95 lb 14.4 oz (43.5 kg) IBW/kg (Calculated) : 59.3 Heparin Dosing Weight: 50 kg  Vital Signs: Temp: 98.4 F (36.9 C) (05/01 0800) Temp Source: Oral (05/01 0800) BP: 108/56 (05/01 0800) Pulse Rate: 89 (05/01 0800)  Labs: Recent Labs    04/12/19 0236 04/13/19 0300 04/14/19 0248  HGB 9.0* 7.8* 8.3*  HCT 29.5* 26.1* 27.3*  PLT 350 355 393  CREATININE 0.38* 0.34*  --     Estimated Creatinine Clearance: 44.3 mL/min (A) (by C-G formula based on SCr of 0.34 mg/dL (L)).   Assessment: Patient's a 73 y.o F s/p recent fall with femur fracture and ORIF on 03/22/19, presented to the ED on 4/22 with c/o fever and hip pain.  Abd CT on 4/22 showed acute pancreatitis and nonocclusive splenic vein/portal vein thrombus. She's currently on anticoag. for thrombus.  - To transition patient from lovenox to back to Eliquis on 5/1  Today, 04/14/2019: - hgb up 8.3 plts ok - no bleeding documented - scr low (crcl>30)  Goal of Therapy:  Heparin level 0.3-0.7 units/ml Monitor platelets by anticoagulation protocol: Yes   Plan:  - d/c lovenox - start eliquis 5 mg bid - monitor for s/s  bleeding  Leonardo Makris P 04/14/2019,9:02 AM

## 2019-04-14 DEATH — deceased

## 2019-04-15 LAB — BASIC METABOLIC PANEL
Anion gap: 8 (ref 5–15)
BUN: 6 mg/dL — ABNORMAL LOW (ref 8–23)
CO2: 30 mmol/L (ref 22–32)
Calcium: 8.1 mg/dL — ABNORMAL LOW (ref 8.9–10.3)
Chloride: 99 mmol/L (ref 98–111)
Creatinine, Ser: 0.3 mg/dL — ABNORMAL LOW (ref 0.44–1.00)
GFR calc Af Amer: >60 mL/min (ref >60)
GFR calc non Af Amer: >60 mL/min (ref >60)
Glucose, Bld: 143 mg/dL — ABNORMAL HIGH (ref 70–99)
Potassium: 3.3 mmol/L — ABNORMAL LOW (ref 3.5–5.1)
Sodium: 137 mmol/L (ref 135–145)

## 2019-04-15 LAB — CBC
HCT: 25.7 % — ABNORMAL LOW (ref 36.0–46.0)
Hemoglobin: 8.2 g/dL — ABNORMAL LOW (ref 12.0–15.0)
MCH: 35.2 pg — ABNORMAL HIGH (ref 26.0–34.0)
MCHC: 31.9 g/dL (ref 30.0–36.0)
MCV: 110.3 fL — ABNORMAL HIGH (ref 80.0–100.0)
Platelets: 414 10*3/uL — ABNORMAL HIGH (ref 150–400)
RBC: 2.33 MIL/uL — ABNORMAL LOW (ref 3.87–5.11)
RDW: 15.6 % — ABNORMAL HIGH (ref 11.5–15.5)
WBC: 9.6 10*3/uL (ref 4.0–10.5)
nRBC: 0 % (ref 0.0–0.2)

## 2019-04-15 LAB — GLUCOSE, CAPILLARY
Glucose-Capillary: 204 mg/dL — ABNORMAL HIGH (ref 70–99)
Glucose-Capillary: 241 mg/dL — ABNORMAL HIGH (ref 70–99)
Glucose-Capillary: 193 mg/dL — ABNORMAL HIGH (ref 70–99)

## 2019-04-15 MED ORDER — FUROSEMIDE 20 MG PO TABS
20.0000 mg | ORAL_TABLET | Freq: Every day | ORAL | Status: DC
  Administered 2019-04-15 – 2019-04-21 (×5): 20 mg via ORAL
  Filled 2019-04-15 (×7): qty 1

## 2019-04-15 MED ORDER — POTASSIUM CHLORIDE CRYS ER 20 MEQ PO TBCR
40.0000 meq | EXTENDED_RELEASE_TABLET | Freq: Once | ORAL | Status: AC
  Administered 2019-04-15: 40 meq via ORAL
  Filled 2019-04-15: qty 2

## 2019-04-15 NOTE — Progress Notes (Signed)
PROGRESS NOTE    April PoissonDeborah S Feigenbaum  WUJ:[id number] DOB: 02/21/1946 DOA: 12/28/18 PCP: Richmond CampbellKaplan, Kristen W., PA-C   Brief Narrative:  HPI on 12/28/18 by Dr. John GiovanniVasundhra Rathore April PoissonDeborah S Seabrooks is a 73 y.o. female with medical history significant of anxiety, tobacco use, depression, alcohol abuse, GERD, hypertension, hyperlipidemia, pancreatitis, recent left intertrochanteric femur fracture status post intervention with intramedullary nail placement on 03/22/2019 presenting to the hospital for evaluation of hip pain and UTI symptoms. History provided by patient very limited.  Reports generalized weakness, dysuria, urinary frequency, and urgency.  Denies any flank pain.  She is not able to give me a timeline of when the symptoms started.  Also reports suprapubic abdominal pain and right-sided hip pain.  Not complaining of any left hip pain.  Denies any cough or shortness of breath.  Denies any fevers, chills, sick contacts, recent travel, or exposure to any individual with confirmed COVID-19.  States she drinks 3 shot glasses of whiskey every day.  She is not able to tell me when her last drink was.  Also reports smoking 1 pack of cigarettes every day.  History per ED provider conversation with the patient's daughter: "Daughter states over the last few days she had been complaining that her right side hurt but today was the worst with worsening pain in her right side and abdomen, fever, mild confusion and severe anorexia. Daughter states that since being home from the hospital she has not felt great and has never been a big eater but today was much worse. Unclear when patient's last bowel movement was. During her last hospitalization she did have severe electrolyte abnormalities which was thought to be related to alcohol abuse. Daughter states she is still drinking at home. She typically drinks brandy and drinks approximately 1-3 shot glasses per day. In the past she has had alcohol withdrawal."   Interim history Patient admitted with sepsis secondary to acute pancreatitis and urinary tract infection.  Her hospitalization has been complicated by stress-induced cardiomyopathy, acute cardiogenic pulmonary edema and acute hypoxic respiratory failure.  Assessment & Plan   Sepsis secondary to urinary tract infection/Proteus mirabilis bacteremia -Sepsis was present on admission as patient presented with fever, tachycardia elevated lactic acid -UA showed positive nitrites, large leukocytes,> 50 WBC, many bacteria -Urine culture shows multiple species -CT abdomen pelvis showed acute pancreatitis involving the body and tail of the pancreas.  2.1 m hypodense fluid collection adjacent to the pancreatic tail and posterior fundus of the stomach concerning for pseudocyst -Blood cultures 12/28/18 showed Proteus mirabilis (pansensitive) -Repeat blood cultures 04/09/2019 showed no growth to date -Patient was placed on IV antibiotics and recently completed 7-day course -Leukocytosis resolved, 9.6  today with discontinuation of antibiotics on 04/12/2019 -Continue to monitor CBC  Acute systolic heart failure/stress-induced cardiomyopathy with acute cardiogenic pulmonary edema/acute hypoxic and hypercapnic respiratory failure -Chest x-ray showed pulmonary opacities, bilateral pulmonary infiltrates.  Stable pleural effusions -BNP 119.5 -Echocardiogram EF of 35 to 40%.  LV diastolic function cannot be evaluated.  Entire mid to apical region appears hypokinetic with hyperdynamic base. -Currently patient does not appear to be hypervolemic -will place patient on low dose oral lasix  -Monitor intake and output, daily weights -Continue metoprolol with holding parameters given soft BP  Acute pancreatitis complicated with pseudocyst and portal vein thrombosis -CT abdomen pelvis showed nonocclusive splenic vein/portal vein thrombus -Gastroenterology consulted and appreciated-recommended transitioning patient to  soft diet with 3 to 6 months of anticoagulation with apixaban.  Continue Florastor probiotic for 2  weeks after completion of antibiotic treatment -Patient initially placed on heparin, transitioned to Lovenox -Continue pain control, IV morphine, IV toradol PRN -Transitioned to Eliquis  Alcohol abuse with potential of withdrawal -Resolved -Continue to monitor closely  Macrocytic anemia -likely due to alcoholism -Hemoglobin 8.2 today -Upon review of patient's chart, hemoglobin has ranged from 8-10 since 2018 -continue to CBC  Hypokalemia -Continue to replace and monitor  Hypomagnesemia -Resolved with replacement, continue to monitor and replace as needed  Non-anion gap metabolic acidosis with hyponatremia and hypochloremia -Resolved  Hypocalcemia -Likely due to malnutrition and alcoholism -Corrected calcium within normal range  Acute metabolic encephalopathy/ sundowning -Patient currently awake, alert and oriented x3 -Continue thiamine and multivitamins -Seems that in the evening hours, patient becomes more confused -Will restart patient's home dose of Klonopin and monitor closely  Severe calorie protein malnutrition -Suspect secondary to alcoholism -nutrition consulted -Continue supplements  Hyperglycemia -No diagnosis of diabetes mellitus -Hemoglobin A1c 5.9 -Continue insulin sliding scale and CBG monitoring  Deconditioning -PT recommended SNF -Discussed this with patient as well as daughter.  Patient would like to return home but understands that she is weak. -Will consult social work  DVT Prophylaxis Eliquis  Code Status: Full  Family Communication: None at bedside. Daughter via phone.   Disposition Plan: Admitted.  Continue to monitor closely in stepdown.  Disposition pending.   Consultants Gastroenterology  Procedures  None  Antibiotics   Anti-infectives (From admission, onward)   Start     Dose/Rate Route Frequency Ordered Stop   04/08/19 1130   ciprofloxacin (CIPRO) IVPB 400 mg  Status:  Discontinued     400 mg 200 mL/hr over 60 Minutes Intravenous Every 12 hours 04/08/19 1110 04/12/19 0821   04/08/19 0900  ciprofloxacin (CIPRO) IVPB 200 mg  Status:  Discontinued     200 mg 100 mL/hr over 60 Minutes Intravenous Every 12 hours 04/08/19 0805 04/08/19 1110   04/07/19 1000  aztreonam (AZACTAM) 2 g in sodium chloride 0.9 % 100 mL IVPB  Status:  Discontinued     2 g 200 mL/hr over 30 Minutes Intravenous Every 8 hours 04/07/19 0809 04/08/19 0805   04/07/19 0800  ciprofloxacin (CIPRO) IVPB 200 mg  Status:  Discontinued     200 mg 100 mL/hr over 60 Minutes Intravenous Every 12 hours 04/07/19 0754 04/07/19 0815   04/06/19 1030  aztreonam (AZACTAM) 2 g in sodium chloride 0.9 % 100 mL IVPB  Status:  Discontinued     2 g 200 mL/hr over 30 Minutes Intravenous Every 8 hours 04/06/19 1008 04/07/19 0754   04/06/19 0500  ciprofloxacin (CIPRO) IVPB 400 mg  Status:  Discontinued     400 mg 200 mL/hr over 60 Minutes Intravenous Every 12 hours May 01, 2019 2043 04/06/19 1008   04/06/19 0400  metroNIDAZOLE (FLAGYL) IVPB 500 mg  Status:  Discontinued     500 mg 100 mL/hr over 60 Minutes Intravenous Every 8 hours 2019/05/01 2031 04/06/19 0826   04/06/19 0200  aztreonam (AZACTAM) 1 g in sodium chloride 0.9 % 100 mL IVPB  Status:  Discontinued     1 g 200 mL/hr over 30 Minutes Intravenous Every 8 hours 2019-05-01 2043 04/06/19 0826   May 01, 2019 1745  aztreonam (AZACTAM) 1 g in sodium chloride 0.9 % 100 mL IVPB     1 g 200 mL/hr over 30 Minutes Intravenous  Once 01-May-2019 1741 2019/05/01 1936   2019-05-01 1745  ciprofloxacin (CIPRO) IVPB 400 mg     400 mg 200 mL/hr over  60 Minutes Intravenous  Once 03/29/2019 1741 03/28/2019 2129   04/10/2019 1745  metroNIDAZOLE (FLAGYL) IVPB 500 mg     500 mg 100 mL/hr over 60 Minutes Intravenous  Once 04/04/2019 1741 03/26/2019 2037      Subjective:   Cesia Orf seen and examined today. States she is getting different stories from  people. Denies chest pain, shortness of breath, dizziness, headache. Has some abdominal pain occasionally. Denies nausea, vomiting.    Objective:   Vitals:   04/15/19 0500 04/15/19 0600 04/15/19 0700 04/15/19 0815  BP: (!) 108/51 (!) 94/48 111/61   Pulse: 95 98 (!) 107   Resp: Temp:    (!) 97.4 F (36.3 C)  TempSrc:    Oral  SpO2: 99% 100% 97%   Weight:      Height:        Intake/Output Summary (Last 24 hours) at 04/15/2019 0856 Last data filed at 04/15/2019 0745 Gross per 24 hour  Intake -  Output 1400 ml  Net -1400 ml   Filed Weights   04/13/19 0500 04/14/19 0650 04/15/19 0328  Weight: 43.5 kg 43.5 kg 44.3 kg   Exam  General: Well developed, chronically ill-appearing, cachectic, NAD  HEENT: NCAT, mucous membranes moist.   Neck: Supple  Cardiovascular: S1 S2 auscultated, RRR  Respiratory: Clear to auscultation bilaterally  Abdomen: Soft, nontender, nondistended, + bowel sounds  Extremities: warm dry without cyanosis clubbing or edema  Neuro: AAOx3, nonfocal  Psych: Pleasant, appropriate mood and affect  Data Reviewed: I have personally reviewed following labs and imaging studies  CBC: Recent Labs  Lab 04/09/19 0042 04/10/19 0250 04/11/19 0300 04/12/19 0236 04/13/19 0300 04/14/19 0248 04/15/19 0235  WBC 15.0* 13.1* 12.3* 15.8* 13.3* 11.9* 9.6  NEUTROABS 12.8* 11.2* 10.4* 13.8*  --   --   --   HGB 11.1* 10.3* 8.8* 9.0* 7.8* 8.3* 8.2*  HCT 35.1* 32.1* 27.3* 29.5* 26.1* 27.3* 25.7*  MCV 109.7* 109.2* 106.6* 112.6* 112.5* 110.1* 110.3*  PLT 264 307 311 350 355 393 414*   Basic Metabolic Panel: Recent Labs  Lab 04/09/19 0042 04/10/19 0250 04/11/19 0300 04/12/19 0236 04/13/19 0300 04/15/19 0235  NA 140 138 138 136 138 137  K 2.7* 3.6 3.9 3.8 3.7 3.3*  CL 103 99 103 98 100 99  CO2 33* 30  GLUCOSE 209* 214* 148* 226* 162* 143*  BUN 6*  CREATININE 0.36* 0.33* <0.30* 0.38* 0.34* 0.30*  CALCIUM 7.9* 7.6* 7.5*  7.7* 7.9* 8.1*  MG 1.4* 1.8 1.8  --  2.0  --    GFR: Estimated Creatinine Clearance: 45.1 mL/min (A) (by C-G formula based on SCr of 0.3 mg/dL (L)). Liver Function Tests: No results for input(s): AST, ALT, ALKPHOS, BILITOT, PROT, ALBUMIN in the last 168 hours. No results for input(s): LIPASE, AMYLASE in the last 168 hours. No results for input(s): AMMONIA in the last 168 hours. Coagulation Profile: No results for input(s): INR, PROTIME in the last 168 hours. Cardiac Enzymes: No results for input(s): CKTOTAL, CKMB, CKMBINDEX, TROPONINI in the last 168 hours. BNP (last 3 results) No results for input(s): PROBNP in the last 8760 hours. HbA1C: No results for input(s): HGBA1C in the last 72 hours. CBG: Recent Labs  Lab 04/14/19 0742 04/14/19 1217 04/14/19 1641 04/14/19 2204 04/15/19 0730  GLUCAP 162* 283* 220* 165* 204*   Lipid Profile: No results for input(s): CHOL, HDL, LDLCALC, TRIG, CHOLHDL, LDLDIRECT in the  last 72 hours. Thyroid Function Tests: No results for input(s): TSH, T4TOTAL, FREET4, T3FREE, THYROIDAB in the last 72 hours. Anemia Panel: No results for input(s): VITAMINB12, FOLATE, FERRITIN, TIBC, IRON, RETICCTPCT in the last 72 hours. Urine analysis:    Component Value Date/Time   COLORURINE AMBER (A) 03/25/2019 1727   APPEARANCEUR CLOUDY (A) 04/03/2019 1727   LABSPEC 1.017 04/09/2019 1727   PHURINE 7.0 04/13/2019 1727   GLUCOSEU NEGATIVE 03/19/2019 1727   HGBUR MODERATE (A) 03/16/2019 1727   BILIRUBINUR NEGATIVE 03/25/2019 1727   KETONESUR NEGATIVE 04/03/2019 1727   PROTEINUR NEGATIVE 03/21/2019 1727   NITRITE POSITIVE (A) 03/30/2019 1727   LEUKOCYTESUR LARGE (A) 03/26/2019 1727   Sepsis Labs: @LABRCNTIP (procalcitonin:4,lacticidven:4)  ) Recent Results (from the past 240 hour(s))  Blood Culture (routine x 2)     Status: Abnormal   Collection Time: 03/30/2019  3:41 PM  Result Value Ref Range Status   Specimen Description   Final    BLOOD LEFT ANTECUBITAL  Performed at [facility], [address] [place], [place] [address]    Special Requests   Final    BOTTLES DRAWN AEROBIC AND ANAEROBIC Blood Culture adequate volume Performed at [facility], [address] [place], [place] [address]    Culture  Setup Time (A)  Final    GRAM VARIABLE ROD IN BOTH AEROBIC AND ANAEROBIC BOTTLES CRITICAL VALUE NOTED.  VALUE IS CONSISTENT WITH PREVIOUSLY REPORTED AND CALLED VALUE.    Culture (A)  Final    PROTEUS MIRABILIS SUSCEPTIBILITIES PERFORMED ON PREVIOUS CULTURE WITHIN THE LAST 5 DAYS. Performed at [facility], [address] [place], [place] [address]    Report Status 04/08/2019 FINAL  Final  Blood Culture (routine x 2)     Status: Abnormal   Collection Time: 04/06/2019  3:41 PM  Result Value Ref Range Status   Specimen Description   Final    BLOOD RIGHT ANTECUBITAL Performed at [facility], [address], [place], [place] [address]    Special Requests   Final    BOTTLES DRAWN AEROBIC AND ANAEROBIC Blood Culture adequate volume Performed at [facility], [address], [place], [place] [address]    Culture  Setup Time (A)  Final    GRAM VARIABLE ROD IN BOTH AEROBIC AND ANAEROBIC BOTTLES CRITICAL RESULT CALLED TO, READ BACK BY AND VERIFIED WITH: Damaris Hippo PharmD 9:35 04/06/19 ([email or web address]) Performed at [facility], [address], [place], [place] [address]    Culture PROTEUS MIRABILIS (A)  Final   Report Status 04/08/2019 FINAL  Final   Organism ID, Bacteria PROTEUS MIRABILIS  Final      Susceptibility   Proteus mirabilis - MIC*    AMPICILLIN <=2 SENSITIVE Sensitive     CEFAZOLIN <=4 SENSITIVE Sensitive     CEFEPIME <=1 SENSITIVE Sensitive     CEFTAZIDIME <=1 SENSITIVE Sensitive     CEFTRIAXONE <=1 SENSITIVE Sensitive     CIPROFLOXACIN <=0.25 SENSITIVE Sensitive     GENTAMICIN <=1 SENSITIVE Sensitive     IMIPENEM 1 SENSITIVE Sensitive      TRIMETH/SULFA <=20 SENSITIVE Sensitive     AMPICILLIN/SULBACTAM <=2 SENSITIVE Sensitive     PIP/TAZO <=4 SENSITIVE Sensitive     * PROTEUS MIRABILIS  Blood Culture ID Panel (Reflexed)     Status: Abnormal   Collection Time: 04/06/2019  3:41 PM  Result Value Ref Range Status   Enterococcus species NOT DETECTED NOT DETECTED Final  Listeria monocytogenes NOT DETECTED NOT DETECTED Final   Staphylococcus species NOT DETECTED NOT DETECTED Final   Staphylococcus aureus (BCID) NOT DETECTED NOT DETECTED Final   Streptococcus species NOT DETECTED NOT DETECTED Final   Streptococcus agalactiae NOT DETECTED NOT DETECTED Final   Streptococcus pneumoniae NOT DETECTED NOT DETECTED Final   Streptococcus pyogenes NOT DETECTED NOT DETECTED Final   Acinetobacter baumannii NOT DETECTED NOT DETECTED Final   Enterobacteriaceae species DETECTED (A) NOT DETECTED Final    Comment: Enterobacteriaceae represent a large family of gram-negative bacteria, not a single organism. CRITICAL RESULT CALLED TO, READ BACK BY AND VERIFIED WITH: Damaris Hippo PharmD 9:35 04/06/19 ([email or web address])    Enterobacter cloacae complex NOT DETECTED NOT DETECTED Final   Escherichia coli NOT DETECTED NOT DETECTED Final   Klebsiella oxytoca NOT DETECTED NOT DETECTED Final   Klebsiella pneumoniae NOT DETECTED NOT DETECTED Final   Proteus species DETECTED (A) NOT DETECTED Final    Comment: CRITICAL RESULT CALLED TO, READ BACK BY AND VERIFIED WITH: Damaris Hippo PharmD 9:35 04/06/19 ([email or web address])    Serratia marcescens NOT DETECTED NOT DETECTED Final   Carbapenem resistance NOT DETECTED NOT DETECTED Final   Haemophilus influenzae NOT DETECTED NOT DETECTED Final   Neisseria meningitidis NOT DETECTED NOT DETECTED Final   Pseudomonas aeruginosa NOT DETECTED NOT DETECTED Final   Candida albicans NOT DETECTED NOT DETECTED Final   Candida glabrata NOT DETECTED NOT DETECTED Final   Candida krusei NOT DETECTED NOT DETECTED Final   Candida parapsilosis NOT  DETECTED NOT DETECTED Final   Candida tropicalis NOT DETECTED NOT DETECTED Final    Comment: Performed at [facility], [address], [place], [place] [address]  Urine culture     Status: Abnormal   Collection Time: 2019-04-27  5:27 PM  Result Value Ref Range Status   Specimen Description   Final    URINE, CATHETERIZED Performed at [facility], [address], [place], [place] [address]    Special Requests   Final    Normal Performed at [facility], [address], [place], [place] [address]    Culture MULTIPLE SPECIES PRESENT, SUGGEST RECOLLECTION (A)  Final   Report Status 04/06/2019 FINAL  Final  Culture, blood (routine x 2)     Status: None   Collection Time: 04/09/19 12:42 AM  Result Value Ref Range Status   Specimen Description   Final    BLOOD LEFT ANTECUBITAL Performed at [facility], [address], [place], [place] [address]    Special Requests   Final    BOTTLES DRAWN AEROBIC ONLY Blood Culture adequate volume Performed at [facility], [address] [place], [place] [address]    Culture   Final    NO GROWTH 5 DAYS Performed at [facility], [address], [place], [place] [address]    Report Status 04/14/2019 FINAL  Final  Culture, blood (routine x 2)     Status: None   Collection Time: 04/09/19 12:42 AM  Result Value Ref Range Status   Specimen Description   Final    BLOOD RIGHT HAND Performed at [facility], [address] [place], [place] [address]    Special Requests   Final    BOTTLES DRAWN AEROBIC ONLY Blood Culture adequate volume Performed at [facility], [address] [place], [place] [address]    Culture   Final    NO GROWTH 5 DAYS Performed at [facility], [address], [place], [place] [address]    Report Status 04/14/2019 FINAL  Final      Radiology Studies: No results found.   Scheduled Meds:  . amitriptyline  25 mg Oral QHS  . apixaban  5 mg Oral BID  . Chlorhexidine Gluconate Cloth  6 each Topical Daily  . feeding supplement  1 Container Oral BID BM  . feeding supplement (ENSURE ENLIVE)  237 mL Oral TID BM  . feeding supplement (PRO-STAT SUGAR FREE 64)  30 mL Oral Daily  . folic acid  1 mg Oral Daily  . insulin aspart  0-9 Units Subcutaneous TID WC  . mouth rinse  15 mL Mouth Rinse BID  . metoprolol tartrate  25 mg Oral Once  . metoprolol tartrate  25 mg Oral BID  . multivitamin with minerals  1 tablet Oral Daily  . nicotine  21 mg Transdermal Daily  . polyethylene glycol  17 g Oral Daily  . saccharomyces boulardii  250 mg Oral BID  . thiamine  100 mg Oral Daily   Continuous Infusions:   LOS: 10 days   Time Spent in minutes   30 minutes  Rida Loudin D.O. on 04/15/2019 at 8:56 AM  Between 7am to 7pm - Please see pager noted on [email or web address]  After 7pm go to [email or web address]  And look for the night coverage person covering for me after hours  [facility] Office  [phone]

## 2019-04-16 LAB — BASIC METABOLIC PANEL
Anion gap: 6 (ref 5–15)
BUN: 12 mg/dL (ref 8–23)
CO2: 32 mmol/L (ref 22–32)
Calcium: 8.5 mg/dL — ABNORMAL LOW (ref 8.9–10.3)
Chloride: 96 mmol/L — ABNORMAL LOW (ref 98–111)
Creatinine, Ser: 0.37 mg/dL — ABNORMAL LOW (ref 0.44–1.00)
GFR calc Af Amer: >60 mL/min (ref >60)
GFR calc non Af Amer: >60 mL/min (ref >60)
Glucose, Bld: 170 mg/dL — ABNORMAL HIGH (ref 70–99)
Potassium: 4.1 mmol/L (ref 3.5–5.1)
Sodium: 134 mmol/L — ABNORMAL LOW (ref 135–145)

## 2019-04-16 LAB — HEMOGLOBIN AND HEMATOCRIT, BLOOD
HCT: 26.7 % — ABNORMAL LOW (ref 36.0–46.0)
Hemoglobin: 8.2 g/dL — ABNORMAL LOW (ref 12.0–15.0)

## 2019-04-16 LAB — GLUCOSE, CAPILLARY
Glucose-Capillary: 191 mg/dL — ABNORMAL HIGH (ref 70–99)
Glucose-Capillary: 274 mg/dL — ABNORMAL HIGH (ref 70–99)
Glucose-Capillary: 159 mg/dL — ABNORMAL HIGH (ref 70–99)
Glucose-Capillary: 230 mg/dL — ABNORMAL HIGH (ref 70–99)
Glucose-Capillary: 120 mg/dL — ABNORMAL HIGH (ref 70–99)
Glucose-Capillary: 141 mg/dL — ABNORMAL HIGH (ref 70–99)

## 2019-04-16 LAB — MAGNESIUM: Magnesium: 1.9 mg/dL (ref 1.7–2.4)

## 2019-04-16 MED ORDER — METOPROLOL TARTRATE 25 MG PO TABS
37.5000 mg | ORAL_TABLET | Freq: Two times a day (BID) | ORAL | Status: DC
  Administered 2019-04-16 – 2019-04-18 (×5): 37.5 mg via ORAL
  Filled 2019-04-16: qty 2
  Filled 2019-04-16: qty 1
  Filled 2019-04-16 (×4): qty 2

## 2019-04-16 MED ORDER — TRAMADOL HCL 50 MG PO TABS
50.0000 mg | ORAL_TABLET | Freq: Four times a day (QID) | ORAL | Status: DC | PRN
  Administered 2019-04-16 – 2019-04-19 (×6): 50 mg via ORAL
  Filled 2019-04-16 (×6): qty 1

## 2019-04-16 NOTE — Progress Notes (Signed)
Talked with patient's daughter Karen Kitchens to give updates for today.

## 2019-04-16 NOTE — Progress Notes (Signed)
Occupational Therapy Treatment Patient Details Name: April PoissonDeborah S Zachar MRN: [id number] DOB: 11/03/1946 Today's Date: 04/16/2019    History of present illness 73 yo female admitted to ED on 4/22 from home, with diagnosis of sepsis secondary to UTI, acute pancreatitis complicated by pancreatic psuedocyst, portal vein thrombosis. Pt additionaly with acute heart failure, acute cardiogenic pulmonary edema, and alcohol withdrawals. Pt recently hospitalized for L hip fracture and ORIF due to falls at home. PMH includes alcohol abuse, HTN, HLD, anxiety, depression, neuropathy.    OT comments  Slow progress.  Pt c/o abdomen pain and has constipation per RN.  Stood briefly at sink but unable to release a hand for adls.  She would benefit from SNF for rehab.  Will continue to follow   Follow Up Recommendations  SNF    Equipment Recommendations  None recommended by OT    Recommendations for Other Services      Precautions / Restrictions Precautions Precautions: Fall Restrictions Weight Bearing Restrictions: No Other Position/Activity Restrictions: LLE WBAT per chart review       Mobility Bed Mobility               General bed mobility comments: oob  Transfers   Equipment used: 1 person hand held assist   Sit to Stand: Mod assist              Balance     Sitting balance-Leahy Scale: Fair       Standing balance-Leahy Scale: Poor Standing balance comment: requires UE support to stand                            ADL either performed or assessed with clinical judgement   ADL                                         General ADL Comments: stood at sink when chair was rolled there.  Mod A for sit to stand and able to hold sink for balance. Unable to perform any grooming activities in standing as she needs both hands for balance.  Placed air cushion in chair to relieve pressure     Vision       Perception     Praxis      Cognition  Arousal/Alertness: Awake/alert Behavior During Therapy: WFL for tasks assessed/performed Overall Cognitive Status: No family/caregiver present to determine baseline cognitive functioning                       Memory: Decreased short-term memory Following Commands: Follows one step commands consistently Safety/Judgement: Decreased awareness of safety   Problem Solving: Requires verbal cues          Exercises Exercises: (10 FF bil UEs AROM)   Shoulder Instructions       General Comments      Pertinent Vitals/ Pain       Pain Assessment: Faces Faces Pain Scale: Hurts even more Pain Location: abdomen Pain Descriptors / Indicators: Aching Pain Intervention(s): Limited activity within patient's tolerance;Monitored during session;Repositioned;Premedicated before session  Home Living                                          Prior Functioning/Environment  Frequency  Min 2X/week        Progress Toward Goals  OT Goals(current goals can now be found in the care plan section)  Progress towards OT goals: Progressing toward goals(slowly)     Plan      Co-evaluation                 AM-PAC OT "6 Clicks" Daily Activity     Outcome Measure   Help from another person eating meals?: None Help from another person taking care of personal grooming?: A Little Help from another person toileting, which includes using toliet, bedpan, or urinal?: A Lot Help from another person bathing (including washing, rinsing, drying)?: A Lot Help from another person to put on and taking off regular upper body clothing?: A Little Help from another person to put on and taking off regular lower body clothing?: Total 6 Click Score: 15    End of Session    OT Visit Diagnosis: Muscle weakness (generalized) (M62.81)   Activity Tolerance Patient limited by pain;Patient limited by fatigue   Patient Left in chair;with call bell/phone within  reach;with chair alarm set   Nurse Communication          Time: 1324-4010 OT Time Calculation (min): 23 min  Charges: OT General Charges $OT Visit: 1 Visit OT Treatments $Therapeutic Activity: 8-22 mins  Marica Otter, OTR/L Acute Rehabilitation Services [phone] [facility] pager [phone] office 04/16/2019   Asta Corbridge 04/16/2019, 10:58 AM

## 2019-04-16 NOTE — Progress Notes (Signed)
PROGRESS NOTE    April [name]  ZOX:[id number] DOB: 1946-11-29 DOA: 05/01/2019 PCP: Richmond Campbell., PA-C   Brief Narrative:  HPI on 05/01/2019 by Dr. John Giovanni April [name] is a 73 y.o. female with medical history significant of anxiety, tobacco use, depression, alcohol abuse, GERD, hypertension, hyperlipidemia, pancreatitis, recent left intertrochanteric femur fracture status post intervention with intramedullary nail placement on 03/22/2019 presenting to the hospital for evaluation of hip pain and UTI symptoms. History provided by patient very limited.  Reports generalized weakness, dysuria, urinary frequency, and urgency.  Denies any flank pain.  She is not able to give me a timeline of when the symptoms started.  Also reports suprapubic abdominal pain and right-sided hip pain.  Not complaining of any left hip pain.  Denies any cough or shortness of breath.  Denies any fevers, chills, sick contacts, recent travel, or exposure to any individual with confirmed COVID-19.  States she drinks 3 shot glasses of whiskey every day.  She is not able to tell me when her last drink was.  Also reports smoking 1 pack of cigarettes every day.  History per ED provider conversation with the patient's daughter: "Daughter states over the last few days she had been complaining that her right side hurt but today was the worst with worsening pain in her right side and abdomen, fever, mild confusion and severe anorexia. Daughter states that since being home from the hospital she has not felt great and has never been a big eater but today was much worse. Unclear when patient's last bowel movement was. During her last hospitalization she did have severe electrolyte abnormalities which was thought to be related to alcohol abuse. Daughter states she is still drinking at home. She typically drinks brandy and drinks approximately 1-3 shot glasses per day. In the past she has had alcohol withdrawal."   Interim history Patient admitted with sepsis secondary to acute pancreatitis and urinary tract infection.  Her hospitalization has been complicated by stress-induced cardiomyopathy, acute cardiogenic pulmonary edema and acute hypoxic respiratory failure.  Assessment & Plan   Sepsis secondary to urinary tract infection/Proteus mirabilis bacteremia -Sepsis was present on admission as patient presented with fever, tachycardia elevated lactic acid -UA showed positive nitrites, large leukocytes,> 50 WBC, many bacteria -Urine culture shows multiple species -CT abdomen pelvis showed acute pancreatitis involving the body and tail of the pancreas.  2.1 m hypodense fluid collection adjacent to the pancreatic tail and posterior fundus of the stomach concerning for pseudocyst -Blood cultures May 01, 2019 showed Proteus mirabilis (pansensitive) -Repeat blood cultures 04/09/2019 showed no growth to date -Patient was placed on IV antibiotics and recently completed 7-day course -Leukocytosis resolved, 9.6  today with discontinuation of antibiotics on 04/12/2019 -Continue to monitor CBC  Acute systolic heart failure/stress-induced cardiomyopathy with acute cardiogenic pulmonary edema/acute hypoxic and hypercapnic respiratory failure -Chest x-ray showed pulmonary opacities, bilateral pulmonary infiltrates.  Stable pleural effusions -BNP 119.5 -Echocardiogram EF of 35 to 40%.  LV diastolic function cannot be evaluated.  Entire mid to apical region appears hypokinetic with hyperdynamic base. -Currently patient does not appear to be hypervolemic -Continue lasix -will increase metoprolol to 37.5mg  BID -Monitor intake and output, daily weights  Acute pancreatitis complicated with pseudocyst and portal vein thrombosis -CT abdomen pelvis showed nonocclusive splenic vein/portal vein thrombus -Gastroenterology consulted and appreciated-recommended transitioning patient to soft diet with 3 to 6 months of anticoagulation  with apixaban.  Continue Florastor probiotic for 2 weeks after completion of antibiotic treatment -Patient initially placed  on heparin, transitioned to Lovenox -Continue pain control, IV morphine, IV toradol PRN- will transition to oral pain control -Transitioned to Eliquis  Alcohol abuse with potential of withdrawal -Resolved -Continue to monitor closely  Macrocytic anemia -likely due to alcoholism -Stable, Hemoglobin 8.2 today -Upon review of patient's chart, hemoglobin has ranged from 8-10 since 2018 -continue to CBC  Hypokalemia -Continue to replace and monitor  Hypomagnesemia -Resolved with replacement, continue to monitor and replace as needed  Non-anion gap metabolic acidosis with hyponatremia and hypochloremia -Resolved  Hypocalcemia -Likely due to malnutrition and alcoholism -Corrected calcium within normal range  Acute metabolic encephalopathy/ sundowning -Patient currently awake, alert and oriented x3 -Continue thiamine and multivitamins -Seems that in the evening hours, patient becomes more confused -Will restart patient's home dose of Klonopin and monitor closely  Severe calorie protein malnutrition -Suspect secondary to alcoholism -nutrition consulted -Continue supplements  Hyperglycemia -No diagnosis of diabetes mellitus -Hemoglobin A1c 5.9 -Continue insulin sliding scale and CBG monitoring  Deconditioning -PT recommended SNF -Discussed this with patient as well as daughter.  Patient would like to return home but understands that she is weak. -Social work consulted  DVT Prophylaxis Eliquis  Code Status: Full  Family Communication: None at bedside. Daughter via phone- answered all questions.   Disposition Plan: Admitted.  Will transfer to tele. Dispo pending  Consultants Gastroenterology  Procedures  None  Antibiotics   Anti-infectives (From admission, onward)   Start     Dose/Rate Route Frequency Ordered Stop   04/08/19 1130   ciprofloxacin (CIPRO) IVPB 400 mg  Status:  Discontinued     400 mg 200 mL/hr over 60 Minutes Intravenous Every 12 hours 04/08/19 1110 04/12/19 0821   04/08/19 0900  ciprofloxacin (CIPRO) IVPB 200 mg  Status:  Discontinued     200 mg 100 mL/hr over 60 Minutes Intravenous Every 12 hours 04/08/19 0805 04/08/19 1110   04/07/19 1000  aztreonam (AZACTAM) 2 g in sodium chloride 0.9 % 100 mL IVPB  Status:  Discontinued     2 g 200 mL/hr over 30 Minutes Intravenous Every 8 hours 04/07/19 0809 04/08/19 0805   04/07/19 0800  ciprofloxacin (CIPRO) IVPB 200 mg  Status:  Discontinued     200 mg 100 mL/hr over 60 Minutes Intravenous Every 12 hours 04/07/19 0754 04/07/19 0815   04/06/19 1030  aztreonam (AZACTAM) 2 g in sodium chloride 0.9 % 100 mL IVPB  Status:  Discontinued     2 g 200 mL/hr over 30 Minutes Intravenous Every 8 hours 04/06/19 1008 04/07/19 0754   04/06/19 0500  ciprofloxacin (CIPRO) IVPB 400 mg  Status:  Discontinued     400 mg 200 mL/hr over 60 Minutes Intravenous Every 12 hours 04/27/2019 2043 04/06/19 1008   04/06/19 0400  metroNIDAZOLE (FLAGYL) IVPB 500 mg  Status:  Discontinued     500 mg 100 mL/hr over 60 Minutes Intravenous Every 8 hours Apr 27, 2019 2031 04/06/19 0826   04/06/19 0200  aztreonam (AZACTAM) 1 g in sodium chloride 0.9 % 100 mL IVPB  Status:  Discontinued     1 g 200 mL/hr over 30 Minutes Intravenous Every 8 hours 04/27/2019 2043 04/06/19 0826   04/27/2019 1745  aztreonam (AZACTAM) 1 g in sodium chloride 0.9 % 100 mL IVPB     1 g 200 mL/hr over 30 Minutes Intravenous  Once Apr 27, 2019 1741 2019/04/27 1936   2019/04/27 1745  ciprofloxacin (CIPRO) IVPB 400 mg     400 mg 200 mL/hr over 60 Minutes Intravenous  Once 03/16/2019 1741 04/03/2019 2129   03/28/2019 1745  metroNIDAZOLE (FLAGYL) IVPB 500 mg     500 mg 100 mL/hr over 60 Minutes Intravenous  Once 04/09/2019 1741 03/17/2019 2037      Subjective:   April [name] seen and examined today. No complaints today.  She denies chest pain,  shortness, dizziness, headache, abdominal pain, nausea, vomiting.  Objective:   Vitals:   04/16/19 0500 04/16/19 0600 04/16/19 0700 04/16/19 0800  BP: (!) 108/53 (!) 115/59 (!) 107/59 (!) 96/53  Pulse: (!) 103 (!) 116 (!) 108 (!) 107  Resp: (!) 28 (!) 25 (!) 25 (!) 23  Temp:    99 F (37.2 C)  TempSrc:    Oral  SpO2: 99% 98% 96% 91%  Weight:      Height:        Intake/Output Summary (Last 24 hours) at 04/16/2019 0910 Last data filed at 04/16/2019 0600 Gross per 24 hour  Intake -  Output 900 ml  Net -900 ml   Filed Weights   04/14/19 0650 04/15/19 0328 04/16/19 0400  Weight: 43.5 kg 44.3 kg 44.8 kg   Exam  General: Well developed, chronically ill appearing, Cachetic, NAD  HEENT: NCAT,mucous membranes moist.   Cardiovascular: S1 S2 auscultated, tachycardic  Respiratory: Clear to auscultation bilaterally   Abdomen: Soft, nontender, nondistended, + bowel sounds  Extremities: warm dry without cyanosis clubbing or edema  Neuro: AAOx3, nonfocal  Psych: Appropriate mood and affect  Data Reviewed: I have personally reviewed following labs and imaging studies  CBC: Recent Labs  Lab 04/10/19 0250 04/11/19 0300 04/12/19 0236 04/13/19 0300 04/14/19 0248 04/15/19 0235 04/16/19 0330  WBC 13.1* 12.3* 15.8* 13.3* 11.9* 9.6  --   NEUTROABS 11.2* 10.4* 13.8*  --   --   --   --   HGB 10.3* 8.8* 9.0* 7.8* 8.3* 8.2* 8.2*  HCT 32.1* 27.3* 29.5* 26.1* 27.3* 25.7* 26.7*  MCV 109.2* 106.6* 112.6* 112.5* 110.1* 110.3*  --   PLT 307 311 350 355 393 414*  --    Basic Metabolic Panel: Recent Labs  Lab 04/10/19 0250 04/11/19 0300 04/12/19 0236 04/13/19 0300 04/15/19 0235 04/16/19 0330  NA 138 138 136 138 137 134*  K 3.6 3.9 3.8 3.7 3.3* 4.1  CL 99 103 98 100 99 96*  CO2 30 29 31  33* 30 32  GLUCOSE 214* 148* 226* 162* 143* 170*  BUN 13 14 15 18  6* 12  CREATININE 0.33* <0.30* 0.38* 0.34* 0.30* 0.37*  CALCIUM 7.6* 7.5* 7.7* 7.9* 8.1* 8.5*  MG 1.8 1.8  --  2.0  --  1.9    GFR: Estimated Creatinine Clearance: 45.6 mL/min (A) (by C-G formula based on SCr of 0.37 mg/dL (L)). Liver Function Tests: No results for input(s): AST, ALT, ALKPHOS, BILITOT, PROT, ALBUMIN in the last 168 hours. No results for input(s): LIPASE, AMYLASE in the last 168 hours. No results for input(s): AMMONIA in the last 168 hours. Coagulation Profile: No results for input(s): INR, PROTIME in the last 168 hours. Cardiac Enzymes: No results for input(s): CKTOTAL, CKMB, CKMBINDEX, TROPONINI in the last 168 hours. BNP (last 3 results) No results for input(s): PROBNP in the last 8760 hours. HbA1C: No results for input(s): HGBA1C in the last 72 hours. CBG: Recent Labs  Lab 04/15/19 1141 04/15/19 1618 04/15/19 1655 04/15/19 2141 04/16/19 0741  GLUCAP 241* 193* 191* 274* 159*   Lipid Profile: No results for input(s): CHOL, HDL, LDLCALC, TRIG, CHOLHDL, LDLDIRECT in the last 72  hours. Thyroid Function Tests: No results for input(s): TSH, T4TOTAL, FREET4, T3FREE, THYROIDAB in the last 72 hours. Anemia Panel: No results for input(s): VITAMINB12, FOLATE, FERRITIN, TIBC, IRON, RETICCTPCT in the last 72 hours. Urine analysis:    Component Value Date/Time   COLORURINE AMBER (A) 04-20-2019 1727   APPEARANCEUR CLOUDY (A) 04-20-19 1727   LABSPEC 1.017 04-20-2019 1727   PHURINE 7.0 04-20-19 1727   GLUCOSEU NEGATIVE Apr 20, 2019 1727   HGBUR MODERATE (A) 04/20/19 1727   BILIRUBINUR NEGATIVE 04/20/2019 1727   KETONESUR NEGATIVE 20-Apr-2019 1727   PROTEINUR NEGATIVE 04-20-2019 1727   NITRITE POSITIVE (A) 2019-04-20 1727   LEUKOCYTESUR LARGE (A) 04/20/19 1727   Sepsis Labs: (procalcitonin:4,lacticidven:4)  ) Recent Results (from the past 240 hour(s))  Culture, blood (routine x 2)     Status: None   Collection Time: 04/09/19 12:42 AM  Result Value Ref Range Status   Specimen Description   Final    BLOOD LEFT ANTECUBITAL Performed at [facility], [address] [place], [place] [address]    Special Requests   Final    BOTTLES DRAWN AEROBIC ONLY Blood Culture adequate volume Performed at [facility], [address] [place], [place] [address]    Culture   Final    NO GROWTH 5 DAYS Performed at [facility], [address] [place], [place] [address]    Report Status 04/14/2019 FINAL  Final  Culture, blood (routine x 2)     Status: None   Collection Time: 04/09/19 12:42 AM  Result Value Ref Range Status   Specimen Description   Final    BLOOD RIGHT HAND Performed at [facility], [address], [place], [place] [address]    Special Requests   Final    BOTTLES DRAWN AEROBIC ONLY Blood Culture adequate volume Performed at [facility], [address] [place], [place] [address]    Culture   Final    NO GROWTH 5 DAYS Performed at [facility], [address], [place], [place] [address]    Report Status 04/14/2019 FINAL  Final      Radiology Studies: No results found.   Scheduled Meds: . amitriptyline  25 mg Oral QHS  . apixaban  5 mg Oral BID  . Chlorhexidine Gluconate Cloth  6 each Topical Daily  . feeding supplement  1 Container Oral BID BM  . feeding supplement (ENSURE ENLIVE)  237 mL Oral TID BM  . feeding supplement (PRO-STAT SUGAR FREE 64)  30 mL Oral Daily  . folic acid  1 mg Oral Daily  . furosemide  20 mg Oral Daily  . insulin aspart  0-9 Units Subcutaneous TID WC  . mouth rinse  15 mL Mouth Rinse BID  . metoprolol tartrate  37.5 mg Oral BID  . multivitamin with minerals  1 tablet Oral Daily  . nicotine  21 mg Transdermal Daily  . polyethylene glycol  17 g Oral Daily  . saccharomyces boulardii  250 mg Oral BID  . thiamine  100 mg Oral Daily   Continuous Infusions:   LOS: 11 days   Time Spent in minutes   30 minutes  Findley Blankenbaker D.O. on 04/16/2019 at 9:10 AM  Between 7am to 7pm - Please see pager noted on [email or web address]  After 7pm  go to [email or web address]  And look for the night coverage person covering for me after hours  [facility] Office  [phone]

## 2019-04-16 NOTE — Progress Notes (Signed)
Patient to transfer to 4E [id number] report given to receiving nurse Annabelle Harman, all questions answered at this time.  Pt. VSS with no s/s of distress noted.  Pt. Stable at transfer.

## 2019-04-17 ENCOUNTER — Inpatient Hospital Stay (HOSPITAL_COMMUNITY): Payer: PPO

## 2019-04-17 LAB — URINALYSIS, ROUTINE W REFLEX MICROSCOPIC
Bilirubin Urine: NEGATIVE
Glucose, UA: NEGATIVE mg/dL
Ketones, ur: NEGATIVE mg/dL
Nitrite: NEGATIVE
Protein, ur: NEGATIVE mg/dL
Specific Gravity, Urine: 1.01 (ref 1.005–1.030)
pH: 7 (ref 5.0–8.0)

## 2019-04-17 LAB — HEMOGLOBIN AND HEMATOCRIT, BLOOD
HCT: 26.6 % — ABNORMAL LOW (ref 36.0–46.0)
Hemoglobin: 8.2 g/dL — ABNORMAL LOW (ref 12.0–15.0)

## 2019-04-17 LAB — BASIC METABOLIC PANEL
Anion gap: 7 (ref 5–15)
BUN: 12 mg/dL (ref 8–23)
CO2: 31 mmol/L (ref 22–32)
Calcium: 8.5 mg/dL — ABNORMAL LOW (ref 8.9–10.3)
Chloride: 97 mmol/L — ABNORMAL LOW (ref 98–111)
Creatinine, Ser: 0.34 mg/dL — ABNORMAL LOW (ref 0.44–1.00)
GFR calc Af Amer: >60 mL/min (ref >60)
GFR calc non Af Amer: >60 mL/min (ref >60)
Glucose, Bld: 142 mg/dL — ABNORMAL HIGH (ref 70–99)
Potassium: 3.7 mmol/L (ref 3.5–5.1)
Sodium: 135 mmol/L (ref 135–145)

## 2019-04-17 LAB — GLUCOSE, CAPILLARY
Glucose-Capillary: 132 mg/dL — ABNORMAL HIGH (ref 70–99)
Glucose-Capillary: 212 mg/dL — ABNORMAL HIGH (ref 70–99)
Glucose-Capillary: 271 mg/dL — ABNORMAL HIGH (ref 70–99)
Glucose-Capillary: 111 mg/dL — ABNORMAL HIGH (ref 70–99)

## 2019-04-17 NOTE — Progress Notes (Signed)
Dr. Catha Gosselin notified of patient temperature of 101.70F orally. Tylenol administered per PRN orders. New orders placed.  Will continue to monitor patient.

## 2019-04-17 NOTE — Progress Notes (Addendum)
PROGRESS NOTE    April [name]  ZOX:[id number] DOB: February 04, 1946 DOA: 04/01/2019 PCP: Richmond Campbell., PA-C   Brief Narrative:  HPI on 03/15/2019 by Dr. John Giovanni April [name] is a 73 y.o. female with medical history significant of anxiety, tobacco use, depression, alcohol abuse, GERD, hypertension, hyperlipidemia, pancreatitis, recent left intertrochanteric femur fracture status post intervention with intramedullary nail placement on 03/22/2019 presenting to the hospital for evaluation of hip pain and UTI symptoms. History provided by patient very limited.  Reports generalized weakness, dysuria, urinary frequency, and urgency.  Denies any flank pain.  She is not able to give me a timeline of when the symptoms started.  Also reports suprapubic abdominal pain and right-sided hip pain.  Not complaining of any left hip pain.  Denies any cough or shortness of breath.  Denies any fevers, chills, sick contacts, recent travel, or exposure to any individual with confirmed COVID-19.  States she drinks 3 shot glasses of whiskey every day.  She is not able to tell me when her last drink was.  Also reports smoking 1 pack of cigarettes every day.  History per ED provider conversation with the patient's daughter: "Daughter states over the last few days she had been complaining that her right side hurt but today was the worst with worsening pain in her right side and abdomen, fever, mild confusion and severe anorexia. Daughter states that since being home from the hospital she has not felt great and has never been a big eater but today was much worse. Unclear when patient's last bowel movement was. During her last hospitalization she did have severe electrolyte abnormalities which was thought to be related to alcohol abuse. Daughter states she is still drinking at home. She typically drinks brandy and drinks approximately 1-3 shot glasses per day. In the past she has had alcohol withdrawal."   Interim history Patient admitted with sepsis secondary to acute pancreatitis and urinary tract infection.  Her hospitalization has been complicated by stress-induced cardiomyopathy, acute cardiogenic pulmonary edema and acute hypoxic respiratory failure.  Assessment & Plan   Sepsis secondary to urinary tract infection/Proteus mirabilis bacteremia -Sepsis was present on admission as patient presented with fever, tachycardia elevated lactic acid -UA showed positive nitrites, large leukocytes,> 50 WBC, many bacteria -Urine culture shows multiple species -CT abdomen pelvis showed acute pancreatitis involving the body and tail of the pancreas.  2.1 m hypodense fluid collection adjacent to the pancreatic tail and posterior fundus of the stomach concerning for pseudocyst -Blood cultures 03/26/2019 showed Proteus mirabilis (pansensitive) -Repeat blood cultures 04/09/2019 showed no growth to date -Patient was placed on IV antibiotics and recently completed 7-day course (antibiotics were discontinued 04/12/2019) -Leukocytosis resolved -Continue to monitor CBC -patient with mild fever around 9pm yesterday evening, however remains afebrile at this time  Acute systolic heart failure/stress-induced cardiomyopathy with acute cardiogenic pulmonary edema/acute hypoxic and hypercapnic respiratory failure -Chest x-ray showed pulmonary opacities, bilateral pulmonary infiltrates.  Stable pleural effusions -BNP 119.5 -Echocardiogram EF of 35 to 40%.  LV diastolic function cannot be evaluated.  Entire mid to apical region appears hypokinetic with hyperdynamic base. -Currently patient does not appear to be hypervolemic -Continue lasix -no ACEi/ARB given soft BP -will increase metoprolol to 37.5mg  BID -Monitor intake and output, daily weights  Acute pancreatitis complicated with pseudocyst and portal vein thrombosis -CT abdomen pelvis showed nonocclusive splenic vein/portal vein thrombus -Gastroenterology  consulted and appreciated-recommended transitioning patient to soft diet with 3 to 6 months of anticoagulation with apixaban.  Continue Florastor probiotic for 2 weeks after completion of antibiotic treatment -Patient initially placed on heparin, transitioned to Lovenox -Continue pain control, IV morphine, IV toradol PRN- will transition to oral pain control -Transitioned to Eliquis  Alcohol abuse with potential of withdrawal -Resolved -Continue to monitor closely  Macrocytic anemia -likely due to alcoholism -Stable, Hemoglobin 8.2 today -Upon review of patient's chart, hemoglobin has ranged from 8-10 since 2018 -continue to CBC  Hypokalemia -Continue to replace and monitor  Hypomagnesemia -Resolved with replacement, continue to monitor and replace as needed  Non-anion gap metabolic acidosis with hyponatremia and hypochloremia -Resolved  Hypocalcemia -Likely due to malnutrition and alcoholism -Corrected calcium within normal range  Acute metabolic encephalopathy/ sundowning -Patient currently awake, alert and oriented x3 -Continue thiamine and multivitamins -Seems that in the evening hours, patient becomes more confused -Will restart patient's home dose of Klonopin and monitor closely  Severe calorie protein malnutrition -Suspect secondary to alcoholism -nutrition consulted -Continue supplements  Hyperglycemia -No diagnosis of diabetes mellitus -Hemoglobin A1c 5.9 -Continue insulin sliding scale and CBG monitoring  Deconditioning -PT recommended SNF -Discussed this with patient as well as daughter.  Patient would like to return home but understands that she is weak. -patient still thinking about SNF -Case manager and social work consulted  DVT Prophylaxis Eliquis  Code Status: Full  Family Communication: None at bedside.    Disposition Plan: Admitted.  Suspect patient will be discharged to home on 04/18/2019 with home health services.  Consultants  Gastroenterology  Procedures  None  Antibiotics   Anti-infectives (From admission, onward)   Start     Dose/Rate Route Frequency Ordered Stop   04/08/19 1130  ciprofloxacin (CIPRO) IVPB 400 mg  Status:  Discontinued     400 mg 200 mL/hr over 60 Minutes Intravenous Every 12 hours 04/08/19 1110 04/12/19 0821   04/08/19 0900  ciprofloxacin (CIPRO) IVPB 200 mg  Status:  Discontinued     200 mg 100 mL/hr over 60 Minutes Intravenous Every 12 hours 04/08/19 0805 04/08/19 1110   04/07/19 1000  aztreonam (AZACTAM) 2 g in sodium chloride 0.9 % 100 mL IVPB  Status:  Discontinued     2 g 200 mL/hr over 30 Minutes Intravenous Every 8 hours 04/07/19 0809 04/08/19 0805   04/07/19 0800  ciprofloxacin (CIPRO) IVPB 200 mg  Status:  Discontinued     200 mg 100 mL/hr over 60 Minutes Intravenous Every 12 hours 04/07/19 0754 04/07/19 0815   04/06/19 1030  aztreonam (AZACTAM) 2 g in sodium chloride 0.9 % 100 mL IVPB  Status:  Discontinued     2 g 200 mL/hr over 30 Minutes Intravenous Every 8 hours 04/06/19 1008 04/07/19 0754   04/06/19 0500  ciprofloxacin (CIPRO) IVPB 400 mg  Status:  Discontinued     400 mg 200 mL/hr over 60 Minutes Intravenous Every 12 hours 2019-04-24 2043 04/06/19 1008   04/06/19 0400  metroNIDAZOLE (FLAGYL) IVPB 500 mg  Status:  Discontinued     500 mg 100 mL/hr over 60 Minutes Intravenous Every 8 hours 04/24/19 2031 04/06/19 0826   04/06/19 0200  aztreonam (AZACTAM) 1 g in sodium chloride 0.9 % 100 mL IVPB  Status:  Discontinued     1 g 200 mL/hr over 30 Minutes Intravenous Every 8 hours 24-Apr-2019 2043 04/06/19 0826   04-24-19 1745  aztreonam (AZACTAM) 1 g in sodium chloride 0.9 % 100 mL IVPB     1 g 200 mL/hr over 30 Minutes Intravenous  Once 2019/04/24 1741 04-24-2019  1936   03/22/2019 1745  ciprofloxacin (CIPRO) IVPB 400 mg     400 mg 200 mL/hr over 60 Minutes Intravenous  Once 03/24/2019 1741 04/02/2019 2129   04/10/2019 1745  metroNIDAZOLE (FLAGYL) IVPB 500 mg     500 mg 100 mL/hr over  60 Minutes Intravenous  Once 04/04/2019 1741 03/27/2019 2037      Subjective:   Jinger NeighborsDeborah Telfair seen and examined today. Patient has no complaints today. Denies chest pain, shortness of breath, abdominal pain, nausea, vomiting, diarrhea, constipation, dizziness, headache.  Objective:   Vitals:   04/16/19 1448 04/16/19 2058 04/17/19 0456 04/17/19 0621  BP: 113/65 125/81 106/67   Pulse: 95 (!) 116 79   Resp: 15 18 16    Temp: 98.3 F (36.8 C) (!) 100.9 F (38.3 C) (!) 97.5 F (36.4 C) 98.3 F (36.8 C)  TempSrc: Oral Oral Oral Oral  SpO2: 93% 94% 100%   Weight:      Height:        Intake/Output Summary (Last 24 hours) at 04/17/2019 1302 Last data filed at 04/17/2019 0900 Gross per 24 hour  Intake 240 ml  Output 1600 ml  Net -1360 ml   Filed Weights   04/14/19 0650 04/15/19 0328 04/16/19 0400  Weight: 43.5 kg 44.3 kg 44.8 kg   Exam  General: Well developed, chronically ill appearing, cachetic, NAD  HEENT: NCAT, mucous membranes moist.   Neck: Supple  Cardiovascular: S1 S2 auscultated, RRR  Respiratory: Clear to auscultation bilaterally   Abdomen: Soft, nontender, nondistended, + bowel sounds  Extremities: warm dry without cyanosis clubbing or edema  Neuro: AAOx3, nonfocal  Psych: Appropriate mood and affect  Data Reviewed: I have personally reviewed following labs and imaging studies  CBC: Recent Labs  Lab 04/11/19 0300 04/12/19 0236 04/13/19 0300 04/14/19 0248 04/15/19 0235 04/16/19 0330 04/17/19 0518  WBC 12.3* 15.8* 13.3* 11.9* 9.6  --   --   NEUTROABS 10.4* 13.8*  --   --   --   --   --   HGB 8.8* 9.0* 7.8* 8.3* 8.2* 8.2* 8.2*  HCT 27.3* 29.5* 26.1* 27.3* 25.7* 26.7* 26.6*  MCV 106.6* 112.6* 112.5* 110.1* 110.3*  --   --   PLT 311 350 355 393 414*  --   --    Basic Metabolic Panel: Recent Labs  Lab 04/11/19 0300 04/12/19 0236 04/13/19 0300 04/15/19 0235 04/16/19 0330 04/17/19 0518  NA 138 136 138 137 134* 135  K 3.9 3.8 3.7 3.3* 4.1 3.7  CL  103 98 100 99 96* 97*  CO2 29 31 33* 30 32 31  GLUCOSE 148* 226* 162* 143* 170* 142*  BUN 14 15 18  6* 12 12  CREATININE <0.30* 0.38* 0.34* 0.30* 0.37* 0.34*  CALCIUM 7.5* 7.7* 7.9* 8.1* 8.5* 8.5*  MG 1.8  --  2.0  --  1.9  --    GFR: Estimated Creatinine Clearance: 45.6 mL/min (A) (by C-G formula based on SCr of 0.34 mg/dL (L)). Liver Function Tests: No results for input(s): AST, ALT, ALKPHOS, BILITOT, PROT, ALBUMIN in the last 168 hours. No results for input(s): LIPASE, AMYLASE in the last 168 hours. No results for input(s): AMMONIA in the last 168 hours. Coagulation Profile: No results for input(s): INR, PROTIME in the last 168 hours. Cardiac Enzymes: No results for input(s): CKTOTAL, CKMB, CKMBINDEX, TROPONINI in the last 168 hours. BNP (last 3 results) No results for input(s): PROBNP in the last 8760 hours. HbA1C: No results for input(s): HGBA1C in the  last 72 hours. CBG: Recent Labs  Lab 04/16/19 1134 04/16/19 1657 04/16/19 2104 04/17/19 0730 04/17/19 1136  GLUCAP 230* 120* 141* 132* 212*   Lipid Profile: No results for input(s): CHOL, HDL, LDLCALC, TRIG, CHOLHDL, LDLDIRECT in the last 72 hours. Thyroid Function Tests: No results for input(s): TSH, T4TOTAL, FREET4, T3FREE, THYROIDAB in the last 72 hours. Anemia Panel: No results for input(s): VITAMINB12, FOLATE, FERRITIN, TIBC, IRON, RETICCTPCT in the last 72 hours. Urine analysis:    Component Value Date/Time   COLORURINE AMBER (A) 2019/04/22 1727   APPEARANCEUR CLOUDY (A) Apr 22, 2019 1727   LABSPEC 1.017 Apr 22, 2019 1727   PHURINE 7.0 04/22/19 1727   GLUCOSEU NEGATIVE Apr 22, 2019 1727   HGBUR MODERATE (A) 04/22/19 1727   BILIRUBINUR NEGATIVE 2019-04-22 1727   KETONESUR NEGATIVE 04/22/2019 1727   PROTEINUR NEGATIVE 04/22/19 1727   NITRITE POSITIVE (A) 04/22/2019 1727   LEUKOCYTESUR LARGE (A) 22-Apr-2019 1727   Sepsis Labs: @LABRCNTIP (procalcitonin:4,lacticidven:4)  ) Recent Results (from the past 240  hour(s))  Culture, blood (routine x 2)     Status: None   Collection Time: 04/09/19 12:42 AM  Result Value Ref Range Status   Specimen Description   Final    BLOOD LEFT ANTECUBITAL Performed at [facility], [address], [place], [place] [address]    Special Requests   Final    BOTTLES DRAWN AEROBIC ONLY Blood Culture adequate volume Performed at [facility], [address] [place], [place] [address]    Culture   Final    NO GROWTH 5 DAYS Performed at [facility], [address] [place], [place] [address]    Report Status 04/14/2019 FINAL  Final  Culture, blood (routine x 2)     Status: None   Collection Time: 04/09/19 12:42 AM  Result Value Ref Range Status   Specimen Description   Final    BLOOD RIGHT HAND Performed at [facility], [address], [place], [place] [address]    Special Requests   Final    BOTTLES DRAWN AEROBIC ONLY Blood Culture adequate volume Performed at [facility], [address], [place], [place] [address]    Culture   Final    NO GROWTH 5 DAYS Performed at [facility], [address] [place], [place] [address]    Report Status 04/14/2019 FINAL  Final      Radiology Studies: No results found.   Scheduled Meds: . amitriptyline  25 mg Oral QHS  . apixaban  5 mg Oral BID  . Chlorhexidine Gluconate Cloth  6 each Topical Daily  . feeding supplement  1 Container Oral BID BM  . feeding supplement (ENSURE ENLIVE)  237 mL Oral TID BM  . feeding supplement (PRO-STAT SUGAR FREE 64)  30 mL Oral Daily  . folic acid  1 mg Oral Daily  . furosemide  20 mg Oral Daily  . insulin aspart  0-9 Units Subcutaneous TID WC  . mouth rinse  15 mL Mouth Rinse BID  . metoprolol tartrate  37.5 mg Oral BID  . multivitamin with minerals  1 tablet Oral Daily  . nicotine  21 mg Transdermal Daily  . polyethylene glycol  17 g Oral Daily  . saccharomyces boulardii  250 mg Oral  BID  . thiamine  100 mg Oral Daily   Continuous Infusions:   LOS: 12 days   Time Spent in minutes   30 minutes  Porshia Blizzard D.O. on 04/17/2019  at 1:02 PM  Between 7am to 7pm - Please see pager noted on [email or web address]  After 7pm go to [email or web address]  And look for the night coverage person covering for me after hours  [facility] Office  [phone]

## 2019-04-17 NOTE — TOC Progression Note (Signed)
Transition of Care [facility]) - Progression Note    Patient Details  Name: April [name] MRN: [id number] Date of Birth: 24-Jul-1946  Transition of [facility]) CM/SW Contact  Mikaylah Libbey, Olegario Messier, RN Phone Number: 04/17/2019, 3:58 PM  Clinical Narrative: Patient continues to decline SNF and adamantly wants home w/contninued HHC with [facility]-rep Clydie Braun already following-recc HHRN/PT/OT/aide/social worker. She lives with dtr,& son in law;has cane,rw. Noted on 02 will confirm if needed @ home.Will need PTAR @ d/c.      Expected Discharge Plan: Home w Home Health Services Barriers to Discharge: Continued Medical Work up  Expected Discharge Plan and Services Expected Discharge Plan: Home w Home Health Services In-house Referral: Clinical Social Work   Post Acute Care Choice: Home Health(vs SNF) Living arrangements for the past 2 months: Single Family Home Expected Discharge Date: (unknown)                         HH Arranged: ([facility]         Social Determinants of Health (SDOH) Interventions    Readmission Risk Interventions No flowsheet data found.

## 2019-04-18 DIAGNOSIS — Z515 Encounter for palliative care: Secondary | ICD-10-CM

## 2019-04-18 DIAGNOSIS — Z7189 Other specified counseling: Secondary | ICD-10-CM

## 2019-04-18 LAB — BLOOD CULTURE ID PANEL (REFLEXED)

## 2019-04-18 LAB — HEMOGLOBIN AND HEMATOCRIT, BLOOD
HCT: 25.5 % — ABNORMAL LOW (ref 36.0–46.0)
Hemoglobin: 7.7 g/dL — ABNORMAL LOW (ref 12.0–15.0)

## 2019-04-18 LAB — GLUCOSE, CAPILLARY
Glucose-Capillary: 124 mg/dL — ABNORMAL HIGH (ref 70–99)
Glucose-Capillary: 247 mg/dL — ABNORMAL HIGH (ref 70–99)
Glucose-Capillary: 254 mg/dL — ABNORMAL HIGH (ref 70–99)
Glucose-Capillary: 238 mg/dL — ABNORMAL HIGH (ref 70–99)

## 2019-04-18 LAB — CBC
HCT: 24.6 % — ABNORMAL LOW (ref 36.0–46.0)
Hemoglobin: 7.5 g/dL — ABNORMAL LOW (ref 12.0–15.0)
MCH: 34.1 pg — ABNORMAL HIGH (ref 26.0–34.0)
MCHC: 30.5 g/dL (ref 30.0–36.0)
MCV: 111.8 fL — ABNORMAL HIGH (ref 80.0–100.0)
Platelets: 392 10*3/uL (ref 150–400)
RBC: 2.2 MIL/uL — ABNORMAL LOW (ref 3.87–5.11)
RDW: 15 % (ref 11.5–15.5)
WBC: 11.9 10*3/uL — ABNORMAL HIGH (ref 4.0–10.5)
nRBC: 0 % (ref 0.0–0.2)

## 2019-04-18 LAB — BASIC METABOLIC PANEL
Anion gap: 10 (ref 5–15)
BUN: 13 mg/dL (ref 8–23)
CO2: 30 mmol/L (ref 22–32)
Calcium: 8.3 mg/dL — ABNORMAL LOW (ref 8.9–10.3)
Chloride: 96 mmol/L — ABNORMAL LOW (ref 98–111)
Creatinine, Ser: 0.3 mg/dL — ABNORMAL LOW (ref 0.44–1.00)
GFR calc Af Amer: >60 mL/min (ref >60)
GFR calc non Af Amer: >60 mL/min (ref >60)
Glucose, Bld: 132 mg/dL — ABNORMAL HIGH (ref 70–99)
Potassium: 3.5 mmol/L (ref 3.5–5.1)
Sodium: 136 mmol/L (ref 135–145)

## 2019-04-18 MED ORDER — SODIUM CHLORIDE 0.9 % IV SOLN
INTRAVENOUS | Status: DC
  Administered 2019-04-18: 11:00:00 via INTRAVENOUS

## 2019-04-18 MED ORDER — SODIUM CHLORIDE 0.9 % IV SOLN
1.0000 g | Freq: Three times a day (TID) | INTRAVENOUS | Status: DC
  Filled 2019-04-18 (×2): qty 1

## 2019-04-18 MED ORDER — SODIUM CHLORIDE 0.9 % IV SOLN
2.0000 g | Freq: Three times a day (TID) | INTRAVENOUS | Status: DC
  Administered 2019-04-18 – 2019-04-24 (×18): 2 g via INTRAVENOUS
  Filled 2019-04-18 (×22): qty 2

## 2019-04-18 MED ORDER — METRONIDAZOLE IN NACL 5-0.79 MG/ML-% IV SOLN
500.0000 mg | Freq: Three times a day (TID) | INTRAVENOUS | Status: DC
  Filled 2019-04-18: qty 100

## 2019-04-18 NOTE — Progress Notes (Signed)
PROGRESS NOTE    April [name]  WUJ:[id number] DOB: [birth year] DOA: 04/15/19 PCP: Richmond Campbell., PA-C   Brief Narrative:  HPI on 2019-04-15 by Dr. John Giovanni April [name] is a 73 y.o. female with medical history significant of anxiety, tobacco use, depression, alcohol abuse, GERD, hypertension, hyperlipidemia, pancreatitis, recent left intertrochanteric femur fracture status post intervention with intramedullary nail placement on 03/22/2019 presenting to the hospital for evaluation of hip pain and UTI symptoms. History provided by patient very limited.  Reports generalized weakness, dysuria, urinary frequency, and urgency.  Denies any flank pain.  She is not able to give me a timeline of when the symptoms started.  Also reports suprapubic abdominal pain and right-sided hip pain.  Not complaining of any left hip pain.  Denies any cough or shortness of breath.  Denies any fevers, chills, sick contacts, recent travel, or exposure to any individual with confirmed COVID-19.  States she drinks 3 shot glasses of whiskey every day.  She is not able to tell me when her last drink was.  Also reports smoking 1 pack of cigarettes every day.  History per ED provider conversation with the patient's daughter: "Daughter states over the last few days she had been complaining that her right side hurt but today was the worst with worsening pain in her right side and abdomen, fever, mild confusion and severe anorexia. Daughter states that since being home from the hospital she has not felt great and has never been a big eater but today was much worse. Unclear when patient's last bowel movement was. During her last hospitalization she did have severe electrolyte abnormalities which was thought to be related to alcohol abuse. Daughter states she is still drinking at home. She typically drinks brandy and drinks approximately 1-3 shot glasses per day. In the past she has had alcohol withdrawal."   Interim history Patient admitted with sepsis secondary to acute pancreatitis and urinary tract infection.  Her hospitalization has been complicated by stress-induced cardiomyopathy, acute cardiogenic pulmonary edema and acute hypoxic respiratory failure.  Hospitalization complicated by fever.  Patient now with positive blood cultures, GNR.  Assessment & Plan   Sepsis secondary to urinary tract infection/Proteus mirabilis and GNR bacteremia -Sepsis was present on admission as patient presented with fever, tachycardia elevated lactic acid -UA showed positive nitrites, large leukocytes,> 50 WBC, many bacteria -Urine culture shows multiple species -CT abdomen pelvis showed acute pancreatitis involving the body and tail of the pancreas.  2.1 m hypodense fluid collection adjacent to the pancreatic tail and posterior fundus of the stomach concerning for pseudocyst -Blood cultures Apr 15, 2019 showed Proteus mirabilis (pansensitive) -Repeat blood cultures 04/09/2019 showed no growth to date -Patient was placed on IV antibiotics and recently completed 7-day course (antibiotics were discontinued 04/12/2019) -Patient has been spiking fevers over the past 24 hours, blood cultures were repeated 04/17/2019 which are now showing GNR -Patient with drug allergies to PCN and cephalosporins, will place her on aztreonam as well as Flagyl  Acute systolic heart failure/stress-induced cardiomyopathy with acute cardiogenic pulmonary edema/acute hypoxic and hypercapnic respiratory failure -Chest x-ray showed pulmonary opacities, bilateral pulmonary infiltrates.  Stable pleural effusions -BNP 119.5 -Echocardiogram EF of 35 to 40%.  LV diastolic function cannot be evaluated.  Entire mid to apical region appears hypokinetic with hyperdynamic base. -Currently patient does not appear to be hypervolemic -Continue lasix -no ACEi/ARB given soft BP -Continue metoprolol 37.5 mg twice daily with holding parameters (heart rate with  better control) -Monitor intake and output,  daily weights  Acute pancreatitis complicated with pseudocyst and portal vein thrombosis -CT abdomen pelvis showed nonocclusive splenic vein/portal vein thrombus -Gastroenterology consulted and appreciated-recommended transitioning patient to soft diet with 3 to 6 months of anticoagulation with apixaban.  Continue Florastor probiotic for 2 weeks after completion of antibiotic treatment -Patient initially placed on heparin, transitioned to Lovenox -Continue pain control, IV morphine, IV toradol PRN- will transition to oral pain control -Transitioned to Eliquis  Alcohol abuse with potential of withdrawal -Resolved -Continue to monitor closely  Macrocytic anemia -likely due to alcoholism -Stable, Hemoglobin 7.5 -Will repeat H&H later today, if further drop will transfuse and hold Eliquis -Upon review of patient's chart, hemoglobin has ranged from 8-10 since 2018 -continue to CBC  Hypokalemia -Continue to replace and monitor  Hypomagnesemia -Resolved with replacement, continue to monitor and replace as needed  Non-anion gap metabolic acidosis with hyponatremia and hypochloremia -Resolved  Hypocalcemia -Likely due to malnutrition and alcoholism -Corrected calcium within normal range  Acute metabolic encephalopathy/ sundowning -Patient currently awake, alert and oriented x3 -Continue thiamine and multivitamins -Seems that in the evening hours, patient becomes more confused -Continue home dose of Klonopin  Severe calorie protein malnutrition -Suspect secondary to alcoholism -nutrition consulted -Continue supplements  Hyperglycemia -No diagnosis of diabetes mellitus -Hemoglobin A1c 5.9 -Continue insulin sliding scale and CBG monitoring  Deconditioning -PT recommended SNF -Discussed this with patient as well as daughter.  Patient would like to return home but understands that she is weak. -patient still thinking about SNF -Case  manager and social work consulted  Goals of care -Discussed with daughter, she knows her mother would not want to be a vegetable but they have not discussed DNR per se. -Discussed goals of care, symptom management, palliative care consultation with daughter.  She is open to obtaining more information. -Palliative care consulted and appreciated  DVT Prophylaxis Eliquis  Code Status: Full  Family Communication: None at bedside.    Disposition Plan: Admitted.  Disposition pending.  Patient now with GNR bacteremia.  Consultants Gastroenterology Palliative care  Procedures  None  Antibiotics   Anti-infectives (From admission, onward)   Start     Dose/Rate Route Frequency Ordered Stop   04/18/19 1000  metroNIDAZOLE (FLAGYL) IVPB 500 mg     500 mg 100 mL/hr over 60 Minutes Intravenous Every 8 hours 04/18/19 0908     04/18/19 1000  aztreonam (AZACTAM) 1 g in sodium chloride 0.9 % 100 mL IVPB  Status:  Discontinued     1 g 200 mL/hr over 30 Minutes Intravenous Every 8 hours 04/18/19 0914 04/18/19 0920   04/18/19 1000  aztreonam (AZACTAM) 2 g in sodium chloride 0.9 % 100 mL IVPB     2 g 200 mL/hr over 30 Minutes Intravenous Every 8 hours 04/18/19 0920     04/08/19 1130  ciprofloxacin (CIPRO) IVPB 400 mg  Status:  Discontinued     400 mg 200 mL/hr over 60 Minutes Intravenous Every 12 hours 04/08/19 1110 04/12/19 0821   04/08/19 0900  ciprofloxacin (CIPRO) IVPB 200 mg  Status:  Discontinued     200 mg 100 mL/hr over 60 Minutes Intravenous Every 12 hours 04/08/19 0805 04/08/19 1110   04/07/19 1000  aztreonam (AZACTAM) 2 g in sodium chloride 0.9 % 100 mL IVPB  Status:  Discontinued     2 g 200 mL/hr over 30 Minutes Intravenous Every 8 hours 04/07/19 0809 04/08/19 0805   04/07/19 0800  ciprofloxacin (CIPRO) IVPB 200 mg  Status:  Discontinued  200 mg 100 mL/hr over 60 Minutes Intravenous Every 12 hours 04/07/19 0754 04/07/19 0815   04/06/19 1030  aztreonam (AZACTAM) 2 g in sodium  chloride 0.9 % 100 mL IVPB  Status:  Discontinued     2 g 200 mL/hr over 30 Minutes Intravenous Every 8 hours 04/06/19 1008 04/07/19 0754   04/06/19 0500  ciprofloxacin (CIPRO) IVPB 400 mg  Status:  Discontinued     400 mg 200 mL/hr over 60 Minutes Intravenous Every 12 hours 04/03/2019 2043 04/06/19 1008   04/06/19 0400  metroNIDAZOLE (FLAGYL) IVPB 500 mg  Status:  Discontinued     500 mg 100 mL/hr over 60 Minutes Intravenous Every 8 hours 04/09/2019 2031 04/06/19 0826   04/06/19 0200  aztreonam (AZACTAM) 1 g in sodium chloride 0.9 % 100 mL IVPB  Status:  Discontinued     1 g 200 mL/hr over 30 Minutes Intravenous Every 8 hours 03/23/2019 2043 04/06/19 0826   03/30/2019 1745  aztreonam (AZACTAM) 1 g in sodium chloride 0.9 % 100 mL IVPB     1 g 200 mL/hr over 30 Minutes Intravenous  Once 03/23/2019 1741 03/16/2019 1936   04/06/2019 1745  ciprofloxacin (CIPRO) IVPB 400 mg     400 mg 200 mL/hr over 60 Minutes Intravenous  Once 03/26/2019 1741 04/11/2019 2129   03/20/2019 1745  metroNIDAZOLE (FLAGYL) IVPB 500 mg     500 mg 100 mL/hr over 60 Minutes Intravenous  Once 03/31/2019 1741 03/22/2019 2037      Subjective:   Jinger NeighborsDeborah Newport seen and examined today.  Patient has no specific complaints today but states she does not feel well.  She denies chest pain, shortness breath, abdominal pain, nausea or vomiting, diarrhea constipation, dizziness or headache.  Objective:   Vitals:   04/17/19 1636 04/17/19 2147 04/17/19 2255 04/18/19 0546  BP:  126/66  (!) 82/56  Pulse:  (!) 104  90  Resp:  20  16  Temp: 98.7 F (37.1 C) (!) 102.5 F (39.2 C) 99.5 F (37.5 C) 97.7 F (36.5 C)  TempSrc: Oral Oral Oral Oral  SpO2:  100%  98%  Weight:    40.9 kg  Height:        Intake/Output Summary (Last 24 hours) at 04/18/2019 0957 Last data filed at 04/18/2019 0600 Gross per 24 hour  Intake 120 ml  Output 500 ml  Net -380 ml   Filed Weights   04/15/19 0328 04/16/19 0400 04/18/19 0546  Weight: 44.3 kg 44.8 kg 40.9 kg    Exam  General: Well developed, chronically ill appearing, NAD  HEENT: NCAT, mucous membranes moist.   Neck: Supple  Cardiovascular: S1 S2 auscultated,RRR, no murmur  Respiratory: Clear to auscultation bilaterally with equal chest rise  Abdomen: Soft, nontender, nondistended, + bowel sounds  Extremities: warm dry without cyanosis clubbing or edema  Neuro: AAOx3, nonfocal  Psych: Appropriate mood and affect   Data Reviewed: I have personally reviewed following labs and imaging studies  CBC: Recent Labs  Lab 04/12/19 0236 04/13/19 0300 04/14/19 0248 04/15/19 0235 04/16/19 0330 04/17/19 0518 04/18/19 0539  WBC 15.8* 13.3* 11.9* 9.6  --   --  11.9*  NEUTROABS 13.8*  --   --   --   --   --   --   HGB 9.0* 7.8* 8.3* 8.2* 8.2* 8.2* 7.5*  HCT 29.5* 26.1* 27.3* 25.7* 26.7* 26.6* 24.6*  MCV 112.6* 112.5* 110.1* 110.3*  --   --  111.8*  PLT 350 355 393 414*  --   --  392   Basic Metabolic Panel: Recent Labs  Lab 04/13/19 0300 04/15/19 0235 04/16/19 0330 04/17/19 0518 04/18/19 0539  NA 138 137 134* 135 136  K 3.7 3.3* 4.1 3.7 3.5  CL 100 99 96* 97* 96*  CO2 33* 30 32 31 30  GLUCOSE 162* 143* 170* 142* 132*  BUN 18 6* CREATININE 0.34* 0.30* 0.37* 0.34* 0.30*  CALCIUM 7.9* 8.1* 8.5* 8.5* 8.3*  MG 2.0  --  1.9  --   --    GFR: Estimated Creatinine Clearance: 41.6 mL/min (A) (by C-G formula based on SCr of 0.3 mg/dL (L)). Liver Function Tests: No results for input(s): AST, ALT, ALKPHOS, BILITOT, PROT, ALBUMIN in the last 168 hours. No results for input(s): LIPASE, AMYLASE in the last 168 hours. No results for input(s): AMMONIA in the last 168 hours. Coagulation Profile: No results for input(s): INR, PROTIME in the last 168 hours. Cardiac Enzymes: No results for input(s): CKTOTAL, CKMB, CKMBINDEX, TROPONINI in the last 168 hours. BNP (last 3 results) No results for input(s): PROBNP in the last 8760 hours. HbA1C: No results for input(s): HGBA1C in the  last 72 hours. CBG: Recent Labs  Lab 04/17/19 0730 04/17/19 1136 04/17/19 1645 04/17/19 2243 04/18/19 0743  GLUCAP 132* 212* 271* 111* 124*   Lipid Profile: No results for input(s): CHOL, HDL, LDLCALC, TRIG, CHOLHDL, LDLDIRECT in the last 72 hours. Thyroid Function Tests: No results for input(s): TSH, T4TOTAL, FREET4, T3FREE, THYROIDAB in the last 72 hours. Anemia Panel: No results for input(s): VITAMINB12, FOLATE, FERRITIN, TIBC, IRON, RETICCTPCT in the last 72 hours. Urine analysis:    Component Value Date/Time   COLORURINE AMBER (A) 04/17/2019 1409   APPEARANCEUR CLOUDY (A) 04/17/2019 1409   LABSPEC 1.010 04/17/2019 1409   PHURINE 7.0 04/17/2019 1409   GLUCOSEU NEGATIVE 04/17/2019 1409   HGBUR SMALL (A) 04/17/2019 1409   BILIRUBINUR NEGATIVE 04/17/2019 1409   KETONESUR NEGATIVE 04/17/2019 1409   PROTEINUR NEGATIVE 04/17/2019 1409   NITRITE NEGATIVE 04/17/2019 1409   LEUKOCYTESUR SMALL (A) 04/17/2019 1409   Sepsis Labs: (procalcitonin:4,lacticidven:4)  ) Recent Results (from the past 240 hour(s))  Culture, blood (routine x 2)     Status: None   Collection Time: 04/09/19 12:42 AM  Result Value Ref Range Status   Specimen Description   Final    BLOOD LEFT ANTECUBITAL Performed at [facility], [address], [place], [place] [address]    Special Requests   Final    BOTTLES DRAWN AEROBIC ONLY Blood Culture adequate volume Performed at [facility], [address] [place], [place] [address]    Culture   Final    NO GROWTH 5 DAYS Performed at [facility], [address] [place], [place] [address]    Report Status 04/14/2019 FINAL  Final  Culture, blood (routine x 2)     Status: None   Collection Time: 04/09/19 12:42 AM  Result Value Ref Range Status   Specimen Description   Final    BLOOD RIGHT HAND Performed at [facility], [address] [place], [place] [address]    Special  Requests   Final    BOTTLES DRAWN AEROBIC ONLY Blood Culture adequate volume Performed at [facility], [address] [place], [place] [address]    Culture   Final    NO GROWTH 5 DAYS Performed at [facility], [address], [place], [place] [address]  Report Status 04/14/2019 FINAL  Final  Culture, blood (routine x 2)     Status: None (Preliminary result)   Collection Time: 04/17/19  2:48 PM  Result Value Ref Range Status   Specimen Description   Final    RIGHT ANTECUBITAL Performed at [facility], [address] [place], [place] [address]    Special Requests   Final    BOTTLES DRAWN AEROBIC AND ANAEROBIC Blood Culture adequate volume Performed at [facility], [address], [place], [place] [address]    Culture  Setup Time   Final    GRAM NEGATIVE RODS ANAEROBIC BOTTLE ONLY CRITICAL RESULT CALLED TO, READ BACK BY AND VERIFIED WITH: M. RENZ PHARMD, AT 0981 04/18/19 BY Renato Shin Performed at [facility], [address] [place], [place] [address]    Culture GRAM NEGATIVE RODS  Final   Report Status PENDING  Incomplete  Culture, blood (routine x 2)     Status: None (Preliminary result)   Collection Time: 04/17/19  2:54 PM  Result Value Ref Range Status   Specimen Description   Final    BLOOD RIGHT HAND Performed at [facility], [address] [place], [place] [address]    Special Requests   Final    BOTTLES DRAWN AEROBIC AND ANAEROBIC Blood Culture adequate volume Performed at [facility], [address] [place], [place] [address]    Culture  Setup Time   Final    GRAM NEGATIVE RODS ANAEROBIC BOTTLE ONLY CRITICAL VALUE NOTED.  VALUE IS CONSISTENT WITH PREVIOUSLY REPORTED AND CALLED VALUE. Organism ID to follow Performed at [facility], [address] [place], [place] [address]    Culture GRAM NEGATIVE RODS  Final   Report Status PENDING   Incomplete      Radiology Studies: Dg Chest 2 View  Result Date: 04/17/2019 CLINICAL DATA:  73 year old female with fever of 101.9 EXAM: CHEST - 2 VIEW COMPARISON:  04/09/2019, 04/08/2019 FINDINGS: Cardiomediastinal silhouette unchanged in size and contour. Improved aeration of the bilateral lungs. No interlobular septal thickening. No pneumothorax. Minimal blunting at the left costophrenic angle with meniscus on the lateral view. Pleuroparenchymal thickening at the apices. No new confluent airspace disease. Stigmata of emphysema, with increased retrosternal airspace, flattened hemidiaphragms, increased AP diameter, and hyperinflation on the AP view. Osteopenia with no displaced fracture. IMPRESSION: Improving appearance of the lungs with residual trace pleural effusions. Emphysema Electronically Signed   By: Gilmer Mor D.O.   On: 04/17/2019 15:28     Scheduled Meds: . amitriptyline  25 mg Oral QHS  . apixaban  5 mg Oral BID  . Chlorhexidine Gluconate Cloth  6 each Topical Daily  . feeding supplement  1 Container Oral BID BM  . feeding supplement (ENSURE ENLIVE)  237 mL Oral TID BM  . feeding supplement (PRO-STAT SUGAR FREE 64)  30 mL Oral Daily  . folic acid  1 mg Oral Daily  . furosemide  20 mg Oral Daily  . insulin aspart  0-9 Units Subcutaneous TID WC  . mouth rinse  15 mL Mouth Rinse BID  . metoprolol tartrate  37.5 mg Oral BID  . multivitamin with minerals  1 tablet Oral Daily  . nicotine  21 mg Transdermal Daily  . polyethylene glycol  17 g Oral Daily  . saccharomyces boulardii  250 mg Oral BID  . thiamine  100 mg Oral Daily   Continuous Infusions: . aztreonam    .  metronidazole       LOS: 13 days   Time Spent in minutes  45 minutes  Cashae Weich D.O. on 04/18/2019 at 9:57 AM  Between 7am to 7pm - Please see pager noted on [email or web address]  After 7pm go to [email or web address]  And look for the night coverage person covering for me after hours  [facility]  Office  [phone]

## 2019-04-18 NOTE — Progress Notes (Addendum)
   04/18/19 1057  Vitals  BP (!) 84/53  MAP (mmHg) (!) 63  BP Method Automatic  Pulse Rate 97  MEWS Score  MEWS RR 0  MEWS Pulse 0  MEWS Systolic 1  MEWS LOC 0  MEWS Temp 0  MEWS Score 1  MEWS Score Color Green  MD paged.  Orders received. Nino Parsley

## 2019-04-18 NOTE — Progress Notes (Signed)
Physical Therapy Treatment Patient Details Name: Venancio PoissonDeborah S Bonsignore MRN: [id number] DOB: 08/10/1946 Today's Date: 04/18/2019    History of Present Illness 73 yo female admitted to ED on 4/22 from home, with diagnosis of sepsis secondary to UTI, acute pancreatitis complicated by pancreatic psuedocyst, portal vein thrombosis. Pt additionaly with acute heart failure, acute cardiogenic pulmonary edema, and alcohol withdrawals. Pt recently hospitalized for L hip fracture and ORIF due to falls at home. PMH includes alcohol abuse, HTN, HLD, anxiety, depression, neuropathy.     PT Comments    Pt agreeable to attempt to mobilize however reports increased left flank pain limiting ability (RN aware).  Pt states she just wants to d/c home.  Pt sat EOB for approx 5 minutes and then assisted back to supine.  Plan per chart is for home with daughter/family to assist.  Continue to recommend SNF at this time.   Follow Up Recommendations  SNF;Supervision/Assistance - 24 hour     Equipment Recommendations  None recommended by PT    Recommendations for Other Services       Precautions / Restrictions Precautions Precautions: Fall Restrictions Other Position/Activity Restrictions: LLE WBAT per chart review    Mobility  Bed Mobility Overal bed mobility: Needs Assistance Bed Mobility: Supine to Sit;Sit to Supine     Supine to sit: Mod assist;HOB elevated Sit to supine: Mod assist   General bed mobility comments: verbal cues for technique, assist for LEs to EOB and trunk upright, cues for use of rails to self assist as able; assist for LEs onto bed and repositioning upon return to supine  Transfers                 General transfer comment: pt declined to attempt due to left flank pain (RN aware and states not new)  Ambulation/Gait                 Stairs             Wheelchair Mobility    Modified Rankin (Stroke Patients Only)       Balance Overall balance assessment:  Needs assistance;History of Falls Sitting-balance support: Feet supported;Single extremity supported Sitting balance-Leahy Scale: Fair                                      Cognition Arousal/Alertness: Awake/alert Behavior During Therapy: WFL for tasks assessed/performed Overall Cognitive Status: No family/caregiver present to determine baseline cognitive functioning                       Memory: Decreased short-term memory Following Commands: Follows one step commands consistently Safety/Judgement: Decreased awareness of safety   Problem Solving: Requires verbal cues General Comments: pt asked same questions repeatedly, wants to go home      Exercises      General Comments        Pertinent Vitals/Pain Pain Assessment: Faces Faces Pain Scale: Hurts even more Pain Location: left flank Pain Descriptors / Indicators: Aching;Sore Pain Intervention(s): Limited activity within patient's tolerance;Monitored during session;Repositioned;Premedicated before session(RN reports pain meds given prior to arrival)    Home Living                      Prior Function            PT Goals (current goals can now be found in the care plan section)  Progress towards PT goals: Progressing toward goals    Frequency    Min 2X/week      PT Plan Current plan remains appropriate    Co-evaluation              AM-PAC PT "6 Clicks" Mobility   Outcome Measure  Help needed turning from your back to your side while in a flat bed without using bedrails?: A Lot Help needed moving from lying on your back to sitting on the side of a flat bed without using bedrails?: A Lot Help needed moving to and from a bed to a chair (including a wheelchair)?: A Lot Help needed standing up from a chair using your arms (e.g., wheelchair or bedside chair)?: Total Help needed to walk in hospital room?: Total Help needed climbing 3-5 steps with a railing? : Total 6 Click  Score: 9    End of Session   Activity Tolerance: Patient limited by fatigue Patient left: with call bell/phone within reach;in bed;with bed alarm set   PT Visit Diagnosis: Muscle weakness (generalized) (M62.81)     Time: 7001-7494 PT Time Calculation (min) (ACUTE ONLY): 18 min  Charges:  $Therapeutic Activity: 8-22 mins                    Zenovia Jarred, PT, DPT Acute Rehabilitation Services Office: [phone] Pager: [phone]  Sarajane Jews 04/18/2019, 3:35 PM

## 2019-04-18 NOTE — TOC Progression Note (Signed)
Transition of Care [facility]) - Progression Note    Patient Details  Name: April [name] MRN: [id number] Date of Birth: [birth year]  [facility] CM/SW Contact  Teresa Nicodemus, Olegario Messier, RN Phone Number: 04/18/2019, 12:58 PM  Clinical Narrative:  Await Palliative Care cons recc.     Expected Discharge Plan: Home w Home Health Services Barriers to Discharge: Continued Medical Work up  Expected Discharge Plan and Services Expected Discharge Plan: Home w Home Health Services In-house Referral: Clinical Social Work   Post Acute Care Choice: Home Health(vs SNF) Living arrangements for the past 2 months: Single Family Home Expected Discharge Date: (unknown)                         HH Arranged: ([facility]         Social Determinants of Health (SDOH) Interventions    Readmission Risk Interventions No flowsheet data found.

## 2019-04-18 NOTE — Progress Notes (Addendum)
PHARMACY - PHYSICIAN COMMUNICATION CRITICAL VALUE ALERT - BLOOD CULTURE IDENTIFICATION (BCID)  April [name] is an 73 y.o. female who presented to [facility] on 03/23/2019 with a chief complaint of dysuria, weakness.  On 4/22 Bcx = P. Mirabilis.  4/26 Bcx = NG, 5/4 Bcx = GNR (anaerobic bottles in each set)  Assessment:  Urinary tract vs intra-abdominal  Name of physician (or Provider) Contacted: Mikhail  Current antibiotics: None  Changes to prescribed antibiotics recommended:  Recommendations accepted by provider - add aztreonam and metronidazole pending final Bcx - called micro to ask BCID to be ran (originally not going to be done as patient has similar organism (ie. GNR) in blood cx this admission.  BCID pending.    Juliette Alcide, PharmD, BCPS.   Work Cell: [phone] 04/18/2019 9:22 AM  Addendum:  [place] Chard ran on 5/4 Bcx and returned as Proteus spp. Continue aztreonam and stop metronidazole  Juliette Alcide, PharmD, BCPS.   Work Cell: [phone] 04/18/2019 10:41 AM

## 2019-04-18 NOTE — Progress Notes (Addendum)
Pharmacy Antibiotic Note  April [name] is a 73 y.o. female admitted on 03/15/2019 with dysuria and weakness.  Pharmacy has been consulted for aztreonam dosing.  Plan: Aztreonam 2 gm IV q8  Height: 5\' 6"  (167.6 cm) Weight: 90 lb 1.6 oz (40.9 kg) IBW/kg (Calculated) : 59.3  Temp (24hrs), Avg:100.2 F (37.9 C), Min:97.7 F (36.5 C), Max:102.5 F (39.2 C)  Recent Labs  Lab 04/12/19 0236 04/13/19 0300 04/14/19 0248 04/15/19 0235 04/16/19 0330 04/17/19 0518 04/18/19 0539  WBC 15.8* 13.3* 11.9* 9.6  --   --  11.9*  CREATININE 0.38* 0.34*  --  0.30* 0.37* 0.34* 0.30*    Estimated Creatinine Clearance: 41.6 mL/min (A) (by C-G formula based on SCr of 0.3 mg/dL (L)).    Allergies  Allergen Reactions  . Naltrexone Shortness Of Breath  . Rocephin [Ceftriaxone Sodium In Dextrose] Anaphylaxis  . Penicillins Rash    Has patient had a PCN reaction causing immediate rash, facial/tongue/throat swelling, SOB or lightheadedness with hypotension: Yes immediate rash  Has patient had a PCN reaction causing severe rash involving mucus membranes or skin necrosis: Yes Has patient had a PCN reaction that required hospitalization": No Has patient had a PCN reaction occurring within the last 10 years: Yes If all of the above answers are "NO", then may proceed with Cephalosporin use.     Antimicrobials this admission:  4/22 aztreonam>> 4/25   5/5>> 4/22 flagyl>> 4/23  5/5>> 4/22 cipro>>4/23 ; resumed 4/25 >>4/29  Dose adjustments this admission:   Microbiology results:  Prev Cx 4/7 UCx: mult sp-final 4/8 MRSA PCR: neg  4/22 BCx x2:  2/2 proteus mirabilis (pans sens) FINAL 4/22 UCx: mult sp-final 4/26 bcx x2: neg FINAL 5/4 BCx2: BCID  2 anaerobics bottle GNR    Thank you for allowing pharmacy to be a part of this patient's care.  Herby Abraham, Pharm.D 04/18/2019 10:01 AM

## 2019-04-18 NOTE — Consult Note (Signed)
Consultation Note Date: 04/18/2019   Patient Name: April [name]  DOB: 1946/10/22  MRN: [id number]  Age / Sex: 73 y.o., female  PCP: Richmond Campbell., PA-C Referring Physician: Edsel Petrin, DO  Reason for Consultation: Establishing goals of care  HPI/Patient Profile: 73 y.o. female  with past medical history of anxiety, depression, ETOH and tobacco use, GERD, HTN, HLD, pancreatitis and recent left intertrochanteric femur fracture s/p intramedullary nailing on 03/22/19  admitted on 04/08/2019 with hip pain and UTI symptoms. Hospital admission for sepsis secondary to UTI and acute pancreatitis. Hospital course complicated by stress-induced cardiomyopathy with EF 35-40%, acute cardiogenic pulmonary edema, acute respiratory failure, and recurrent fevers. Blood cultures 4/22 showed proteus mirabilis and negative on 4/26. Repeat blood cultures 5/4 positive for GNR. Patient also with portal vein thrombosis. GI consulted and recommending soft diet with 3-6 months anticoagulation. Receiving Eliquis. Severe protein calorie malnutrition likely due to alcoholism; daughter reports 1-3 shot glasses per day of brandy. Palliative medicine consultation for goals of care.   Clinical Assessment and Goals of Care:  I have reviewed medical records, discussed with care team, and assessed the patient at the bedside. Patient is awake, alert, oriented but weak and forgetful. She is frustrated and wants to go home. Explained reasoning for ongoing hospitalization including diagnoses and interventions.    Shortly after, spoke with daughter, April [name], via telephone. Due to Covid-19 visitor restrictions, unable to meet in person.   Introduced Palliative Medicine as specialized medical care for people living with serious illness. It focuses on providing relief from the symptoms and stress of a serious illness. The goal is to improve quality of  life for both the patient and the family.  We discussed a brief life review of the patient. 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 Her husband died in 04-21-12. April [name] is her only child but has multiple grand and great-grandchildren. She enjoys watching [employer] sports and spending time with her family and two dogs. She played the piano and organ.   Bobbi and her husband moved in with Ms. Allegretto 3-4 years ago to help care for her. Nutritionally, she has always "eaten like a bird." Per daughter, she drinks 1-3 shots of brandy every day.   Discussed events since fall at the beginning of April. She was discharged home with home health.   Discussed course of hospitalization including diagnoses and interventions. Reviewed medications, test and lab results. Discussed her conversation with Dr. Catha Gosselin. Explained need for ongoing IV antibiotics and re-initiation of IVF with fever, repeat positive blood cultures, elevated WBC's, and soft BP's. Daughter has a good understanding of medical diagnoses and interventions.   April [name] is tearful and asks if palliative means hospice. Educated on difference between palliative medicine and hospice services.  I attempted to elicit values and goals of care important to the patient and daughter. Advanced directives, concepts specific to code status, artifical feeding and hydration were discussed. If her mother developed worsening clinical condition, Bobbi wishes for FULL code/FULL scope  treatment including transfer back to ICU/mechanical ventilation if necessary. She further explains that her mother "would not want that long term" but she would wish for these interventions to be attempted with hopes for improvement.  April [name] is overwhelmed. "This is just a lot." She shares frustrations with not being able to visit with her mother in person. She is hopeful to get her mother home as soon as medically stable. We discussed watchful waiting and  continuing current plan of care. We discussed abstinence from alcohol.   Questions and concerns were addressed. PMT contact information given. Daughter appreciative of ongoing palliative follow-up.    SUMMARY OF RECOMMENDATIONS    Continue FULL code/FULL scope treatment including transfer to ICU if worsening clinical condition.   Watchful waiting. Daughter hopeful for improvement and ability for her mother to return home. Patient will likely decline SNF for rehab.  Educated and encouraged abstinence from alcohol.   PMT will follow clinical progression inpatient and continue communication with daughter. May benefit from outpatient palliative referral.    Code Status/Advance Care Planning:  Full code  Symptom Management:   Per attending  Palliative Prophylaxis:   Aspiration, Delirium Protocol, Oral Care and Turn Reposition  Psycho-social/Spiritual:   Desire for further Chaplaincy support:yes  Additional Recommendations: Caregiving  Support/Resources  Prognosis:   Unable to determine  Discharge Planning: To Be Determined      Primary Diagnoses: Present on Admission:  Sepsis (HCC)   I have reviewed the medical record, interviewed the patient and family, and examined the patient. The following aspects are pertinent.  Past Medical History:  Diagnosis Date   Anxiety    Current smoker    Depression    ETOH abuse    GERD (gastroesophageal reflux disease)    History of shingles summer 2015   HTN (hypertension)    Hyperlipidemia    Hypertension    Leg edema    Neuropathy    Neuropathy    Pancreatitis    Social History   Socioeconomic History   Marital status: Widowed    Spouse name: Not on file   Number of children: 1   Years of education: 56   Highest education level: Not on file  Occupational History   Not on file  Social Needs   Financial resource strain: Not on file   Food insecurity:    Worry: Not on file    Inability: Not  on file   Transportation needs:    Medical: Not on file    Non-medical: Not on file  Tobacco Use   Smoking status: Current Every Day Smoker    Packs/day: 1.00    Types: Cigarettes   Smokeless tobacco: Never Used  Substance and Sexual Activity   Alcohol use: Yes    Alcohol/week: 14.0 standard drinks    Types: 14 Shots of liquor per week    Comment: states., "I keep a glass with me", reports drinking today (04/09/17)   Drug use: No   Sexual activity: Not Currently  Lifestyle   Physical activity:    Days per week: Not on file    Minutes per session: Not on file   Stress: Not on file  Relationships   Social connections:    Talks on phone: Not on file    Gets together: Not on file    Attends religious service: Not on file    Active member of club or organization: Not on file    Attends meetings of clubs or organizations: Not on file  Relationship status: Not on file  Other Topics Concern   Not on file  Social History Narrative   Lives at home with daughter April [name]   Caffeine use: Drinks coffee 1/day   Family History  Problem Relation Age of Onset   Heart disease Father    Neuropathy Neg Hx    Scheduled Meds:  amitriptyline  25 mg Oral QHS   apixaban  5 mg Oral BID   feeding supplement  1 Container Oral BID BM   feeding supplement (ENSURE ENLIVE)  237 mL Oral TID BM   feeding supplement (PRO-STAT SUGAR FREE 64)  30 mL Oral Daily   folic acid  1 mg Oral Daily   furosemide  20 mg Oral Daily   insulin aspart  0-9 Units Subcutaneous TID WC   mouth rinse  15 mL Mouth Rinse BID   metoprolol tartrate  37.5 mg Oral BID   multivitamin with minerals  1 tablet Oral Daily   nicotine  21 mg Transdermal Daily   polyethylene glycol  17 g Oral Daily   saccharomyces boulardii  250 mg Oral BID   thiamine  100 mg Oral Daily   Continuous Infusions:  sodium chloride 50 mL/hr at 04/18/19 1120   aztreonam 2 g (04/18/19 1120)   PRN Meds:.acetaminophen **OR**  acetaminophen, clonazePAM, metoprolol tartrate, morphine injection, ondansetron, traMADol Medications Prior to Admission:  Prior to Admission medications   Medication Sig Start Date End Date Taking? Authorizing Provider  amitriptyline (ELAVIL) 25 MG tablet Take 25 mg by mouth at bedtime as needed for sleep.  03/23/19  Yes [provider]  aspirin EC 325 MG EC tablet Take 1 tablet (325 mg total) by mouth daily with breakfast. 03/24/19  Yes Pokhrel, Laxman, MD  calcium-vitamin D (OSCAL WITH D) 500-200 MG-UNIT tablet Take 1 tablet by mouth 2 (two) times daily. 03/24/19  Yes Pokhrel, Laxman, MD  clonazePAM (KLONOPIN) 0.5 MG tablet Take 0.5 mg by mouth 2 (two) times daily as needed for anxiety. as directed 08/05/17  Yes [provider]  folic acid (FOLVITE) 1 MG tablet Take 1 tablet (1 mg total) by mouth daily. 03/24/19  Yes Pokhrel, Laxman, MD  magnesium oxide (MAG-OX) 400 (241.3 Mg) MG tablet Take 1 tablet (400 mg total) by mouth 2 (two) times daily. 03/24/19  Yes Pokhrel, Laxman, MD  methocarbamol (ROBAXIN) 500 MG tablet Take 1 tablet (500 mg total) by mouth every 6 (six) hours as needed for muscle spasms. 04/10/19  Yes Kathryne Hitch, MD  ondansetron (ZOFRAN) 4 MG tablet Take 1 tablet (4 mg total) by mouth every 6 (six) hours as needed for nausea. 03/24/19  Yes Pokhrel, Laxman, MD  oxyCODONE-acetaminophen (PERCOCET/ROXICET) 5-325 MG tablet Take 1 tablet by mouth every 6 (six) hours as needed for severe pain. Apr 10, 2019  Yes Kathryne Hitch, MD  pantoprazole (PROTONIX) 40 MG tablet Take 1 tablet (40 mg total) by mouth daily for 15 days. 03/24/19 04/08/19 Yes Pokhrel, Laxman, MD  promethazine (PHENERGAN) 25 MG tablet Take 25 mg by mouth every 6 (six) hours as needed. for nausea 01/24/19  Yes [provider]  thiamine 100 MG tablet Take 1 tablet (100 mg total) by mouth daily. 03/24/19  Yes Pokhrel, Laxman, MD  feeding supplement, ENSURE ENLIVE, (ENSURE ENLIVE) LIQD Take 237  mLs by mouth 2 (two) times daily between meals. Patient not taking: Reported on 03/21/2019 07/26/18   Narda Bonds, MD  Multiple Vitamin (MULTIVITAMIN WITH MINERALS) TABS tablet Take 1 tablet by mouth  daily. Patient not taking: Reported on 03/21/2019 04/13/17   Penny Pia, MD  polyethylene glycol [facility] / [facility]) packet Take 17 g by mouth daily. Patient not taking: Reported on 03/21/2019 07/27/18   Narda Bonds, MD   Allergies  Allergen Reactions   Naltrexone Shortness Of Breath   Rocephin [Ceftriaxone Sodium In Dextrose] Anaphylaxis   Penicillins Rash    Has patient had a PCN reaction causing immediate rash, facial/tongue/throat swelling, SOB or lightheadedness with hypotension: Yes immediate rash  Has patient had a PCN reaction causing severe rash involving mucus membranes or skin necrosis: Yes Has patient had a PCN reaction that required hospitalization": No Has patient had a PCN reaction occurring within the last 10 years: Yes If all of the above answers are "NO", then may proceed with Cephalosporin use.    Review of Systems  Constitutional: Positive for activity change and appetite change.  Gastrointestinal: Positive for abdominal pain.  Neurological: Positive for weakness.   Physical Exam Vitals signs and nursing note reviewed.  Constitutional:      General: She is awake.     Appearance: She is cachectic. She is ill-appearing.  HENT:     Head: Normocephalic and atraumatic.  Cardiovascular:     Rate and Rhythm: Normal rate.  Pulmonary:     Effort: No tachypnea, accessory muscle usage or respiratory distress.  Abdominal:     Tenderness: There is abdominal tenderness.  Skin:    General: Skin is warm and dry.     Coloration: Skin is pale.  Neurological:     Mental Status: She is alert and oriented to person, place, and time.     Comments: Forgetful  Psychiatric:        Mood and Affect: Mood normal.        Speech: Speech normal.        Behavior: Behavior normal.      Vital Signs: BP (!) 93/54 (BP Location: Right Arm)    Pulse 93    Temp 98.6 F (37 C) (Oral)    Resp 20    Ht  (1.676 m)    Wt 40.9 kg    SpO2 98%    BMI 14.54 kg/m  Pain Scale: 0-10 POSS *See Group Information*: S-Acceptable,Sleep, easy to arouse Pain Score: 3    SpO2: SpO2: 98 % O2 Device:SpO2: 98 % O2 Flow Rate: .O2 Flow Rate (L/min): 2 L/min  IO: Intake/output summary:   Intake/Output Summary (Last 24 hours) at 04/18/2019 1539 Last data filed at 04/18/2019 0600 Gross per 24 hour  Intake 120 ml  Output 500 ml  Net -380 ml    LBM: Last BM Date: 04/17/19 Baseline Weight: Weight: 49.9 kg Most recent weight: Weight: 40.9 kg     Palliative Assessment/Data: PPS 40%   Flowsheet Rows     Most Recent Value  Intake Tab  Referral Department  Hospitalist  Unit at Time of Referral  Med/Surg Unit  Palliative Care Primary Diagnosis  Sepsis/Infectious Disease  Palliative Care Type  New Palliative care  Reason for referral  Clarify Goals of Care  Date first seen by Palliative Care  04/18/19  Clinical Assessment  Palliative Performance Scale Score  40%  Psychosocial & Spiritual Assessment  Palliative Care Outcomes  Patient/Family meeting held?  Yes  Who was at the meeting?  patient and daughter  Palliative Care Outcomes  Clarified goals of care, Provided psychosocial or spiritual support, ACP counseling assistance      Time In/Out: 1110-1130, 1325-1410  Time Total: 75min Greater than 50%  of this time was spent counseling and coordinating care related to the above assessment and plan.  Signed by:  Vennie HomansMegan Keoki Mchargue, DNP, FNP-C Palliative Medicine Team  Phone: [phone] Fax: [phone]  Please contact Palliative Medicine Team phone at [phone] for questions and concerns.  For individual provider: See Loretha StaplerAmion

## 2019-04-19 LAB — CBC
HCT: 23.4 % — ABNORMAL LOW (ref 36.0–46.0)
Hemoglobin: 7.3 g/dL — ABNORMAL LOW (ref 12.0–15.0)
MCH: 34.4 pg — ABNORMAL HIGH (ref 26.0–34.0)
MCHC: 31.2 g/dL (ref 30.0–36.0)
MCV: 110.4 fL — ABNORMAL HIGH (ref 80.0–100.0)
Platelets: 424 10*3/uL — ABNORMAL HIGH (ref 150–400)
RBC: 2.12 MIL/uL — ABNORMAL LOW (ref 3.87–5.11)
RDW: 15 % (ref 11.5–15.5)
WBC: 10 10*3/uL (ref 4.0–10.5)
nRBC: 0 % (ref 0.0–0.2)

## 2019-04-19 LAB — URINE CULTURE

## 2019-04-19 LAB — COMPREHENSIVE METABOLIC PANEL
ALT: 15 U/L (ref 0–44)
AST: 19 U/L (ref 15–41)
Albumin: 2 g/dL — ABNORMAL LOW (ref 3.5–5.0)
Alkaline Phosphatase: 99 U/L (ref 38–126)
Anion gap: 7 (ref 5–15)
BUN: 14 mg/dL (ref 8–23)
CO2: 30 mmol/L (ref 22–32)
Calcium: 8.2 mg/dL — ABNORMAL LOW (ref 8.9–10.3)
Chloride: 97 mmol/L — ABNORMAL LOW (ref 98–111)
Creatinine, Ser: <0.3 mg/dL — ABNORMAL LOW (ref 0.44–1.00)
Glucose, Bld: 187 mg/dL — ABNORMAL HIGH (ref 70–99)
Potassium: 3.5 mmol/L (ref 3.5–5.1)
Sodium: 134 mmol/L — ABNORMAL LOW (ref 135–145)
Total Bilirubin: 0.3 mg/dL (ref 0.3–1.2)
Total Protein: 5.1 g/dL — ABNORMAL LOW (ref 6.5–8.1)

## 2019-04-19 LAB — GLUCOSE, CAPILLARY
Glucose-Capillary: 150 mg/dL — ABNORMAL HIGH (ref 70–99)
Glucose-Capillary: 287 mg/dL — ABNORMAL HIGH (ref 70–99)
Glucose-Capillary: 158 mg/dL — ABNORMAL HIGH (ref 70–99)
Glucose-Capillary: 228 mg/dL — ABNORMAL HIGH (ref 70–99)

## 2019-04-19 MED ORDER — METOPROLOL TARTRATE 25 MG PO TABS
12.5000 mg | ORAL_TABLET | Freq: Once | ORAL | Status: AC
  Administered 2019-04-19: 12.5 mg via ORAL
  Filled 2019-04-19: qty 1

## 2019-04-19 MED ORDER — ENSURE ENLIVE PO LIQD
237.0000 mL | Freq: Three times a day (TID) | ORAL | Status: DC
  Administered 2019-04-19 – 2019-04-21 (×4): 237 mL via ORAL

## 2019-04-19 MED ORDER — ACETAMINOPHEN 650 MG RE SUPP
650.0000 mg | Freq: Four times a day (QID) | RECTAL | Status: DC | PRN
  Administered 2019-04-22: 650 mg via RECTAL
  Filled 2019-04-19: qty 1

## 2019-04-19 MED ORDER — HYDROCODONE-ACETAMINOPHEN 5-325 MG PO TABS
2.0000 | ORAL_TABLET | Freq: Once | ORAL | Status: AC
  Administered 2019-04-19: 2 via ORAL
  Filled 2019-04-19: qty 2

## 2019-04-19 MED ORDER — METOPROLOL TARTRATE 25 MG PO TABS
25.0000 mg | ORAL_TABLET | Freq: Two times a day (BID) | ORAL | Status: DC
  Administered 2019-04-20: 25 mg via ORAL
  Filled 2019-04-19 (×3): qty 1

## 2019-04-19 MED ORDER — ACETAMINOPHEN 500 MG PO TABS
1000.0000 mg | ORAL_TABLET | Freq: Four times a day (QID) | ORAL | Status: DC | PRN
  Administered 2019-04-19 – 2019-04-21 (×7): 1000 mg via ORAL
  Filled 2019-04-19 (×8): qty 2

## 2019-04-19 NOTE — Progress Notes (Signed)
Occupational Therapy Treatment Patient Details Name: April PoissonDeborah S [name] MRN: [id number] DOB: 01/30/1946 Today's Date: 04/19/2019    History of present illness 73 yo female admitted to ED on 4/22 from home, with diagnosis of sepsis secondary to UTI, acute pancreatitis complicated by pancreatic psuedocyst, portal vein thrombosis. Pt additionaly with acute heart failure, acute cardiogenic pulmonary edema, and alcohol withdrawals. Pt recently hospitalized for L hip fracture and ORIF due to falls at home. PMH includes alcohol abuse, HTN, HLD, anxiety, depression, neuropathy.       Follow Up Recommendations  SNF    Equipment Recommendations  None recommended by OT    Recommendations for Other Services      Precautions / Restrictions Precautions Precautions: Fall Restrictions Other Position/Activity Restrictions: LLE WBAT per chart review       Mobility Bed Mobility Overal bed mobility: Needs Assistance Bed Mobility: Supine to Sit     Supine to sit: HOB elevated;Max assist        Transfers Overall transfer level: Needs assistance Equipment used: Rolling walker (2 wheeled) Transfers: Sit to/from BJ'sStand;Stand Pivot Transfers Sit to Stand: +2 safety/equipment;Max assist;+2 physical assistance Stand pivot transfers: +2 safety/equipment;Total assist            Balance Overall balance assessment: Needs assistance;History of Falls Sitting-balance support: Feet supported;Single extremity supported Sitting balance-Leahy Scale: Fair                                     ADL either performed or assessed with clinical judgement   ADL Overall ADL's : Needs assistance/impaired     Grooming: Set up;Supervision/safety                   Toilet Transfer: Maximal assistance;+2 for safety/equipment;+2 for physical assistance;Total assistance;Cueing for sequencing;Cueing for safety   Toileting- Clothing Manipulation and Hygiene: +2 for safety/equipment;+2 for  physical assistance;Total assistance;Cueing for sequencing;Cueing for safety         General ADL Comments: needed max VC and encouragement     Vision Patient Visual Report: No change from baseline            Cognition Arousal/Alertness: Awake/alert Behavior During Therapy: WFL for tasks assessed/performed Overall Cognitive Status: No family/caregiver present to determine baseline cognitive functioning                       Memory: Decreased short-term memory Following Commands: Follows one step commands consistently Safety/Judgement: Decreased awareness of safety   Problem Solving: Requires verbal cues General Comments: pt asked same questions repeatedly, wants to go home                   Pertinent Vitals/ Pain       Pain Assessment: No/denies pain         Frequency  Min 2X/week        Progress Toward Goals  OT Goals(current goals can now be found in the care plan section)  Progress towards OT goals: Progressing toward goals     Plan Discharge plan remains appropriate       AM-PAC OT "6 Clicks" Daily Activity     Outcome Measure   Help from another person eating meals?: None Help from another person taking care of personal grooming?: A Little Help from another person toileting, which includes using toliet, bedpan, or urinal?: Total Help from another person bathing (including washing, rinsing, drying)?: A  Lot Help from another person to put on and taking off regular upper body clothing?: A Little Help from another person to put on and taking off regular lower body clothing?: Total 6 Click Score: 14    End of Session    OT Visit Diagnosis: Muscle weakness (generalized) (M62.81)   Activity Tolerance Patient tolerated treatment well   Patient Left in chair;with call bell/phone within reach;with chair alarm set   Nurse Communication Mobility status        Time: 1610-9604 OT Time Calculation (min): 18 min  Charges: OT General  Charges $OT Visit: 1 Visit OT Treatments $Self Care/Home Management : 8-22 mins  Lise Auer, OT Acute Rehabilitation Services [phone] Office- [phone]      Serenitee Fuertes, Karin Golden D 04/19/2019, 1:18 PM

## 2019-04-19 NOTE — Progress Notes (Signed)
PROGRESS NOTE    TISHAWNA LAROUCHE  WUJ:[id number] DOB: July 10, 1946 DOA: 03/18/2019 PCP: Richmond Campbell., PA-C   Brief Narrative:    73 y.o. CF  bipolar, continued tobacco use, ,alcohol abuse, GERD, hypertension, hyperlipidemia, pancreatitis, recent left intertrochanteric femur fracture status post intervention with intramedullary nail placement on 03/22/2019 presenting to the hospital for evaluation of hip pain and UTI symptoms. Limited history on admission  States she drinks 3 shot glasses of whiskey every day.  She is not able to tell me when her last drink was.  Also reports smoking 1 pack of cigarettes every day.   Ultimately found after admitted with sepsis secondary to acute pancreatitis and urinary tract infection.  Her hospitalization has been complicated by stress-induced cardiomyopathy, acute cardiogenic pulmonary edema and acute hypoxic respiratory failure.  Hospitalization complicated by fever.  Patient now with positive blood cultures, GNR.  Assessment & Plan   Sepsis secondary to urinary tract infection/Proteus mirabilis and Enterobacter  -Sepsis on admission as patient presented with fever, tachycardia elevated lactic acid -UA showed positive nitrites, large leukocytes,> 50 WBC, many bacteria -Urine culture shows multiple species -CT abdomen pelvis showed acute pancreatitis involving the body and tail of the pancreas.  2.1 m hypodense fluid collection adjacent to the pancreatic tail and posterior fundus of the stomach concerning for pseudocyst -Blood cultures 03/18/2019 showed Proteus mirabilis (pansensitive) -Repeat blood cultures 04/09/2019 showed no growth to date -Patient was placed on IV antibiotics and recently completed 7-day course (antibiotics were discontinued 04/12/2019) -Patient has been spiking fevers over the past 24 hours, blood cultures were repeated 04/17/2019 with Enterobacter-fever spikes have improved -Patient with drug allergies to PCN and  cephalosporins--currently on Azactam second round of antibiotics since 5/5  Acute systolic heart failure/stress-induced cardiomyopathy with acute cardiogenic pulmonary edema/acute hypoxic and hypercapnic respiratory failure -Chest x-ray showed pulmonary opacities, bilateral pulmonary infiltrates.  Stable pleural effusions -BNP 119.5 -Echocardiogram EF of 35 to 40%.  LV diastolic function cannot be evaluated.  Entire mid to apical region appears hypokinetic with hyperdynamic base. -Currently patient does not appear to be hypervolemic -Continue lasix -no ACEi/ARB given soft BP -Metoprolol dose dropped from 37.5 twice daily to 25 given hypotension-monitor on telemetry as constitutionally low blood pressures according to nursing with children's blood pressure cuff -Monitor intake and output, daily weights  Acute pancreatitis complicated with pseudocyst and portal vein thrombosis -CT abdomen pelvis showed nonocclusive splenic vein/portal vein thrombus -Gastroenterology consulted and appreciated-recommended transitioning patient to soft diet with 3 to 6 months of anticoagulation with apixaban.  Continue Florastor probiotic for 2 weeks after completion of antibiotic treatment -Patient initially placed on heparin, transitioned to Lovenox coronary-Transitioned to Eliquis  Alcohol abuse with potential of withdrawal -Resolved -Continue to monitor closely  Macrocytic anemia -likely due to alcoholism -Stable, Hemoglobin 7 range -transfuse if below 7  Hypokalemia -Continue to replace and monitor  Hypomagnesemia -Resolved with replacement, continue to monitor and replace as needed  Non-anion gap metabolic acidosis with hyponatremia and hypochloremia -Resolved  Hypocalcemia -Likely due to malnutrition and alcoholism -Corrected calcium within normal range  Acute metabolic encephalopathy/ sundowning -Patient currently awake, alert and oriented x3 -Continue thiamine and multivitamins -Seems  that in the evening hours, patient becomes more confused -Continue home dose of Klonopin -Sotp all pain meds other than tylenol  Severe calorie protein malnutrition -Suspect secondary to alcoholism -nutrition consulted -Continue supplements  Hyperglycemia -No diagnosis of diabetes mellitus -Hemoglobin A1c 5.9 -Continue insulin sliding scale and CBG monitoring  Deconditioning -PT recommended SNF -  Discussed this with patient as well as daughter.  Patient would like to return home but understands that she is weak. -patient still thinking about SNF -Case manager and social work consulted  Goals of care -Discussed with daughter, she knows her mother would not want to be a vegetable but they have not discussed DNR per se. -Discussed goals of care, symptom management, palliative care consultation with daughter.  She is open to obtaining more information. -Palliative care consulted and appreciated  DVT Prophylaxis Eliquis  Code Status: Full  Family Communication: None at bedside.    Disposition Plan: Admitted.  Disposition pending.  Patient now with GNR bacteremia.  Consultants Gastroenterology Palliative care  Procedures  None    Subjective:   Awake coherent but not really eating Somewhat able to tell me somatic complaints which are mainly some mild lower extremity pains No chest pain no fever  Objective:   Vitals:   04/19/19 0530 04/19/19 1031 04/19/19 1100 04/19/19 1529  BP: (!) 105/57 (!) 94/56  115/71  Pulse: 93 90  (!) 104  Resp: 16 14  (!) 22  Temp: 97.9 F (36.6 C) 98.7 F (37.1 C)  99.4 F (37.4 C)  TempSrc: Oral Oral  Oral  SpO2: 99% 100% 98% 97%  Weight: 42.5 kg     Height:        Intake/Output Summary (Last 24 hours) at 04/19/2019 1543 Last data filed at 04/19/2019 1205 Gross per 24 hour  Intake 360 ml  Output 300 ml  Net 60 ml   Filed Weights   04/16/19 0400 04/18/19 0546 04/19/19 0530  Weight: 44.8 kg 40.9 kg 42.5 kg   Exam  Frail  cachectic Caucasian female looking older than stated age  Arcus senilis  Neck soft supple  No JVD no bruit no thyromegaly  Bitemporal wasting  S1-S2 no murmur  Chest clinically clear no added sound  Abdomen soft no rebound no guarding    Data Reviewed: I have personally reviewed following labs and imaging studies  CBC: Recent Labs  Lab 04/13/19 0300 04/14/19 0248 04/15/19 0235 04/16/19 0330 04/17/19 0518 04/18/19 0539 04/18/19 1136 04/19/19 0519  WBC 13.3* 11.9* 9.6  --   --  11.9*  --  10.0  HGB 7.8* 8.3* 8.2* 8.2* 8.2* 7.5* 7.7* 7.3*  HCT 26.1* 27.3* 25.7* 26.7* 26.6* 24.6* 25.5* 23.4*  MCV 112.5* 110.1* 110.3*  --   --  111.8*  --  110.4*  PLT 355 393 414*  --   --  392  --  424*   Basic Metabolic Panel: Recent Labs  Lab 04/13/19 0300 04/15/19 0235 04/16/19 0330 04/17/19 0518 04/18/19 0539 04/19/19 0519  NA 138 137 134* 135 136 134*  K 3.7 3.3* 4.1 3.7 3.5 3.5  CL 100 99 96* 97* 96* 97*  CO2 33* 30 32 31 30 30   GLUCOSE 162* 143* 170* 142* 132* 187*  BUN 18 6* 12 12 13 14   CREATININE 0.34* 0.30* 0.37* 0.34* 0.30* <0.30*  CALCIUM 7.9* 8.1* 8.5* 8.5* 8.3* 8.2*  MG 2.0  --  1.9  --   --   --    GFR: CrCl cannot be calculated (This lab value cannot be used to calculate CrCl because it is not a number: <0.30). Liver Function Tests: Recent Labs  Lab 04/19/19 0519  AST 19  ALT 15  ALKPHOS 99  BILITOT 0.3  PROT 5.1*  ALBUMIN 2.0*   No results for input(s): LIPASE, AMYLASE in the last 168 hours. No  results for input(s): AMMONIA in the last 168 hours. Coagulation Profile: No results for input(s): INR, PROTIME in the last 168 hours. Cardiac Enzymes: No results for input(s): CKTOTAL, CKMB, CKMBINDEX, TROPONINI in the last 168 hours. BNP (last 3 results) No results for input(s): PROBNP in the last 8760 hours. HbA1C: No results for input(s): HGBA1C in the last 72 hours. CBG: Recent Labs  Lab 04/18/19 1116 04/18/19 1634 04/18/19 2120 04/19/19  0740 04/19/19 1209  GLUCAP 247* 254* 238* 150* 287*   Lipid Profile: No results for input(s): CHOL, HDL, LDLCALC, TRIG, CHOLHDL, LDLDIRECT in the last 72 hours. Thyroid Function Tests: No results for input(s): TSH, T4TOTAL, FREET4, T3FREE, THYROIDAB in the last 72 hours. Anemia Panel: No results for input(s): VITAMINB12, FOLATE, FERRITIN, TIBC, IRON, RETICCTPCT in the last 72 hours. Urine analysis:    Component Value Date/Time   COLORURINE AMBER (A) 04/17/2019 1409   APPEARANCEUR CLOUDY (A) 04/17/2019 1409   LABSPEC 1.010 04/17/2019 1409   PHURINE 7.0 04/17/2019 1409   GLUCOSEU NEGATIVE 04/17/2019 1409   HGBUR SMALL (A) 04/17/2019 1409   BILIRUBINUR NEGATIVE 04/17/2019 1409   KETONESUR NEGATIVE 04/17/2019 1409   PROTEINUR NEGATIVE 04/17/2019 1409   NITRITE NEGATIVE 04/17/2019 1409   LEUKOCYTESUR SMALL (A) 04/17/2019 1409   Sepsis Labs: @LABRCNTIP (procalcitonin:4,lacticidven:4)  ) Recent Results (from the past 240 hour(s))  Culture, Urine     Status: Abnormal   Collection Time: 04/17/19  2:09 PM  Result Value Ref Range Status   Specimen Description   Final    URINE, CLEAN CATCH Performed at [facility], [address] [place], [place] [address]    Special Requests   Final    NONE Performed at [facility], [address] [place], [place] [address]    Culture MULTIPLE SPECIES PRESENT, SUGGEST RECOLLECTION (A)  Final   Report Status 04/19/2019 FINAL  Final  Culture, blood (routine x 2)     Status: Abnormal (Preliminary result)   Collection Time: 04/17/19  2:48 PM  Result Value Ref Range Status   Specimen Description   Final    RIGHT ANTECUBITAL Performed at [facility], [address] [place], [place] [address]    Special Requests   Final    BOTTLES DRAWN AEROBIC AND ANAEROBIC Blood Culture adequate volume Performed at [facility], [address], [place], [place] [address]    Culture   Setup Time   Final    GRAM NEGATIVE RODS IN BOTH AEROBIC AND ANAEROBIC BOTTLES CRITICAL RESULT CALLED TO, READ BACK BY AND VERIFIED WITH: M. RENZ PHARMD, AT 6301 04/18/19 BY Renato Shin Performed at [facility], [address], [place], [place] [address]    Culture PROTEUS MIRABILIS (A)  Final   Report Status PENDING  Incomplete  Blood Culture ID Panel (Reflexed)     Status: Abnormal   Collection Time: 04/17/19  2:53 PM  Result Value Ref Range Status   Enterococcus species NOT DETECTED NOT DETECTED Final   Listeria monocytogenes NOT DETECTED NOT DETECTED Final   Staphylococcus species NOT DETECTED NOT DETECTED Final   Staphylococcus aureus (BCID) NOT DETECTED NOT DETECTED Final   Streptococcus species NOT DETECTED NOT DETECTED Final   Streptococcus agalactiae NOT DETECTED NOT DETECTED Final   Streptococcus pneumoniae NOT DETECTED NOT DETECTED Final   Streptococcus pyogenes NOT DETECTED NOT DETECTED Final   Acinetobacter baumannii NOT DETECTED NOT DETECTED Final   Enterobacteriaceae species DETECTED (A) NOT DETECTED Final  Comment: Enterobacteriaceae represent a large family of gram-negative bacteria, not a single organism. CRITICAL RESULT CALLED TO, READ BACK BY AND VERIFIED WITH: Becky Sax PHARMD, AT 1040 04/18/19 BY D. VANHOOK    Enterobacter cloacae complex NOT DETECTED NOT DETECTED Final   Escherichia coli NOT DETECTED NOT DETECTED Final   Klebsiella oxytoca NOT DETECTED NOT DETECTED Final   Klebsiella pneumoniae NOT DETECTED NOT DETECTED Final   Proteus species DETECTED (A) NOT DETECTED Final    Comment: CRITICAL RESULT CALLED TO, READ BACK BY AND VERIFIED WITH: Becky Sax PHARMD, AT 1040 04/18/19 BY D. VANHOOK    Serratia marcescens NOT DETECTED NOT DETECTED Final   Carbapenem resistance NOT DETECTED NOT DETECTED Final   Haemophilus influenzae NOT DETECTED NOT DETECTED Final   Neisseria meningitidis NOT DETECTED NOT DETECTED Final   Pseudomonas aeruginosa NOT DETECTED NOT  DETECTED Final   Candida albicans NOT DETECTED NOT DETECTED Final   Candida glabrata NOT DETECTED NOT DETECTED Final   Candida krusei NOT DETECTED NOT DETECTED Final   Candida parapsilosis NOT DETECTED NOT DETECTED Final   Candida tropicalis NOT DETECTED NOT DETECTED Final    Comment: Performed at [facility], [address] [place], [place] [address]  Culture, blood (routine x 2)     Status: Abnormal (Preliminary result)   Collection Time: 04/17/19  2:54 PM  Result Value Ref Range Status   Specimen Description   Final    BLOOD RIGHT HAND Performed at [facility], [address], [place], [place] [address]    Special Requests   Final    BOTTLES DRAWN AEROBIC AND ANAEROBIC Blood Culture adequate volume Performed at [facility], [address] [place], [place] [address]    Culture  Setup Time   Final    GRAM NEGATIVE RODS IN BOTH AEROBIC AND ANAEROBIC BOTTLES CRITICAL VALUE NOTED.  VALUE IS CONSISTENT WITH PREVIOUSLY REPORTED AND CALLED VALUE.    Culture PROTEUS MIRABILIS (A)  Final   Report Status PENDING  Incomplete      Radiology Studies: No results found.   Scheduled Meds: . amitriptyline  25 mg Oral QHS  . apixaban  5 mg Oral BID  . feeding supplement  1 Container Oral BID BM  . feeding supplement (ENSURE ENLIVE)  237 mL Oral TID BM  . feeding supplement (PRO-STAT SUGAR FREE 64)  30 mL Oral Daily  . folic acid  1 mg Oral Daily  . furosemide  20 mg Oral Daily  . insulin aspart  0-9 Units Subcutaneous TID WC  . mouth rinse  15 mL Mouth Rinse BID  . metoprolol tartrate  25 mg Oral BID  . multivitamin with minerals  1 tablet Oral Daily  . nicotine  21 mg Transdermal Daily  . polyethylene glycol  17 g Oral Daily  . saccharomyces boulardii  250 mg Oral BID  . thiamine  100 mg Oral Daily   Continuous Infusions: . sodium chloride 50 mL/hr at 04/18/19 1120  . aztreonam 2 g (04/19/19 1157)     LOS: 14 days   Time Spent  in minutes  30  Pleas Koch, MD [facility] 3:43 PM

## 2019-04-19 NOTE — Progress Notes (Signed)
Nutrition Follow-up  RD working remotely.   DOCUMENTATION CODES:   Severe malnutrition in context of chronic illness, Underweight  INTERVENTION:  - continue ensure enlive but will decrease to BID. - continue boost breeze BID and prostat once/day. - continue to encourage PO intakes.  - will continue to monitor for additional needs.    NUTRITION DIAGNOSIS:   Severe Malnutrition related to chronic illness as evidenced by moderate fat depletion, severe fat depletion, moderate muscle depletion, severe muscle depletion. -ongoing  GOAL:   Patient will meet greater than or equal to 90% of their needs -unmet  MONITOR:   PO intake, Supplement acceptance, Labs, Weight trends  REASON FOR ASSESSMENT:   Consult Assessment of nutrition requirement/status  ASSESSMENT:   73 year old female with past medical hx of HTN, dyslipidemia, pancreatitis, alcohol abuse, depression, anxiety, and GERD. She had a recent L intratrochanteric femur fracture s/p intramedullary nail placement 4/8. She presented to the ED with R hip pain and urinary tract symptoms. Patient also reported several days of R-sided abdominal pain, fever, confusion, and severe feelings of anorexia. Patient stated generalized weakness, dysuria, urinary frequencys and urinary urgency. She continues to drink alcohol daily. Abdominal CT showed acute pancreatitis involving the body and tail of the pancreas and fluid collection concerning for pseudocyst. Patient was admitted with a working diagnosis of sepsis d/t acute pancreatitis and UTI.  Weight -7.4 kg/16 lb compared to admission (4/22) weight. Last documented meal intakes are 50% of breakfast on 5/4 and 0% of breakfast this AM. Per review of orders, patient has accepted ensure enlive ~90% of the time, boost breeze about 50% of the time, and prostat about 50% of the time.   Palliative Care following and last saw patient this AM at which time patient was sipping on coffee and boost breeze.  Plan at this time is to continue full code.     Medications reviewed; 1 mg folvite/day, 20 mg oral lasix/day, sliding scale novolog, daily multivitamin with minerals, 17 g miralax/day, 250 mg florastor BID, 100 mg oral thiamine/day.  Labs reviewed; CBGs: 150 mg/dl today, Na: 569 mmol/l, Cl: 97 mmol/l, creatinine: 0.3 mg/dl, Ca: 8.2 mg/dl.  IVF; NS @ 50 ml/hr.     Diet Order:   Diet Order            DIET SOFT Room service appropriate? Yes; Fluid consistency: Thin  Diet effective now              EDUCATION NEEDS:   Not appropriate for education at this time  Skin:  Skin Assessment: Skin Integrity Issues: Skin Integrity Issues:: Incisions Incisions: L hip (4/8; PTA)  Last BM:  5/4  Height:   Ht Readings from Last 1 Encounters:  04/21/19 5\' 6"  (1.676 m)    Weight:   Wt Readings from Last 1 Encounters:  04/19/19 42.5 kg    Ideal Body Weight:  59.09 kg  BMI:  Body mass index is 15.12 kg/m.  Estimated Nutritional Needs:   Kcal:  1650-1850 kcal  Protein:  75-85 grams  Fluid:  >/= 1.8 L/day     Trenton Gammon, MS, RD, LDN, [facility] Inpatient Clinical Dietitian Pager # [phone] After hours/weekend pager # [phone]

## 2019-04-19 NOTE — Progress Notes (Signed)
Daily Progress Note   Patient Name: April [name]       Date: 04/19/2019 DOB: 1946-03-25  Age: 73 y.o. MRN#: [id number] Attending Physician: Rhetta Mura, MD Primary Care Physician: Richmond Campbell., PA-C Admit Date: 04/08/2019  Reason for Consultation/Follow-up: Establishing goals of care  Subjective:  Patient awake, alert, oriented. C/o of ongoing abdominal discomfort not relieved by tylenol. Discussed with RN to try prn tramadol. Sipping on coffee and boost breeze.   Assisted patient with ROM exercises in the bed. After she receives prn pain medication, she is willing to work with physical therapy. Updated RN.   Called daughter, April [name] and updated on plan of care and current status. Offered to video chat with patient and daughter, but patient declines. Bobbi appreciative of call and has PMT contact information if questions or concerns.   Length of Stay: 14  Current Medications: Scheduled Meds:  . amitriptyline  25 mg Oral QHS  . apixaban  5 mg Oral BID  . feeding supplement  1 Container Oral BID BM  . feeding supplement (ENSURE ENLIVE)  237 mL Oral TID BM  . feeding supplement (PRO-STAT SUGAR FREE 64)  30 mL Oral Daily  . folic acid  1 mg Oral Daily  . furosemide  20 mg Oral Daily  . insulin aspart  0-9 Units Subcutaneous TID WC  . mouth rinse  15 mL Mouth Rinse BID  . metoprolol tartrate  25 mg Oral BID  . multivitamin with minerals  1 tablet Oral Daily  . nicotine  21 mg Transdermal Daily  . polyethylene glycol  17 g Oral Daily  . saccharomyces boulardii  250 mg Oral BID  . thiamine  100 mg Oral Daily    Continuous Infusions: . sodium chloride 50 mL/hr at 04/18/19 1120  . aztreonam 2 g (04/19/19 0205)    PRN Meds: acetaminophen **OR** acetaminophen,  clonazePAM, metoprolol tartrate, morphine injection, ondansetron, traMADol  Physical Exam Vitals signs and nursing note reviewed.  Constitutional:      General: She is awake.     Appearance: She is cachectic. She is ill-appearing.  Cardiovascular:     Rate and Rhythm: Normal rate.  Pulmonary:     Effort: No tachypnea, accessory muscle usage or respiratory distress.  Abdominal:     Tenderness: There is generalized abdominal tenderness  and tenderness in the right upper quadrant.  Skin:    General: Skin is warm and dry.     Coloration: Skin is pale.  Neurological:     Mental Status: She is alert and oriented to person, place, and time.  Psychiatric:        Mood and Affect: Mood normal.        Speech: Speech normal.        Behavior: Behavior normal.            Vital Signs: BP (!) 105/57 (BP Location: Right Arm)   Pulse 93   Temp 97.9 F (36.6 C) (Oral)   Resp 16   Ht 5\' 6"  (1.676 m)   Wt 42.5 kg   SpO2 99%   BMI 15.12 kg/m  SpO2: SpO2: 99 % O2 Device: O2 Device: Nasal Cannula O2 Flow Rate: O2 Flow Rate (L/min): 3 L/min  Intake/output summary:   Intake/Output Summary (Last 24 hours) at 04/19/2019 0943 Last data filed at 04/19/2019 0530 Gross per 24 hour  Intake 0 ml  Output 0 ml  Net 0 ml   LBM: Last BM Date: 04/17/19 Baseline Weight: Weight: 49.9 kg Most recent weight: Weight: 42.5 kg       Palliative Assessment/Data: PPS 40%   Flowsheet Rows     Most Recent Value  Intake Tab  Referral Department  Hospitalist  Unit at Time of Referral  Med/Surg Unit  Palliative Care Primary Diagnosis  Sepsis/Infectious Disease  Palliative Care Type  New Palliative care  Reason for referral  Clarify Goals of Care  Date first seen by Palliative Care  04/18/19  Clinical Assessment  Palliative Performance Scale Score  40%  Psychosocial & Spiritual Assessment  Palliative Care Outcomes  Patient/Family meeting held?  Yes  Who was at the meeting?  patient and daughter   Palliative Care Outcomes  Clarified goals of care, Provided psychosocial or spiritual support, ACP counseling assistance      Patient Active Problem List   Diagnosis Date Noted  . Palliative care by specialist   . Goals of care, counseling/discussion   . Stress-induced cardiomyopathy 04/09/2019  . Respiratory failure (HCC) hypoxic and hypercapnic/ acute 04/09/2019  . Sepsis (HCC) 2019-11-30  . Acute pancreatitis 2019-11-30  . UTI (urinary tract infection) 2019-11-30  . Pancreatic pseudocyst 2019-11-30  . Portal vein thrombosis 2019-11-30  . Hip fracture (HCC) 03/21/2019  . Displaced intertrochanteric fracture of left femur, initial encounter for closed fracture (HCC)   . Anaphylactic reaction 07/24/2018  . FTT (failure to thrive) in adult 05/18/2018  . Falls 05/18/2018  . Pancreatitis 10/05/2017  . Frequent falls 02/11/2017  . Protein-calorie malnutrition, severe 02/05/2017  . Gallstone pancreatitis 01/30/2017  . Hypokalemia 01/30/2017  . Macrocytic anemia 01/30/2017  . Metabolic acidosis 01/30/2017  . Neuropathy, alcoholic (HCC) 09/08/2016  . Neuropathy 12/11/2015  . Alcohol abuse 12/11/2015  . Tobacco abuse 12/11/2015  . Chronic venous insufficiency 09/13/2015  . Extremity numbness 09/13/2015    Palliative Care Assessment & Plan   Patient Profile: 73 y.o. female  with past medical history of anxiety, depression, ETOH and tobacco use, GERD, HTN, HLD, pancreatitis and recent left intertrochanteric femur fracture s/p intramedullary nailing on 03/22/19  admitted on January 06, 2019 with hip pain and UTI symptoms. Hospital admission for sepsis secondary to UTI and acute pancreatitis. Hospital course complicated by stress-induced cardiomyopathy with EF 35-40%, acute cardiogenic pulmonary edema, acute respiratory failure, and recurrent fevers. Blood cultures 4/22 showed proteus mirabilis and negative on 4/26. Repeat  blood cultures 5/4 positive for GNR. Patient also with portal vein  thrombosis. GI consulted and recommending soft diet with 3-6 months anticoagulation. Receiving Eliquis. Severe protein calorie malnutrition likely due to alcoholism; daughter reports 1-3 shot glasses per day of brandy. Palliative medicine consultation for goals of care.   Assessment: Sepsis UTI Proteus mirabilis and GNR bacteremia Acute systolic heart failure/stress-induced cardiomyopathy Acute cardiogenic pulmonary edema Acute hypoxic and hypercapnic respiratory failure Acute pancreatitis complicated with pseudocyst and portal vein thrombosis ETOH abuse  Acute metabolic encephalopathy Severe protein calorie malnutrition Deconditioning   Recommendations/Plan:  FULL code/FULL scope treatment  Watchful waiting. Continue current plan of care. Patient/daughter decline SNF for rehab.  Educated and encouraged abstinence from alcohol. Encouraged nutritional drinks.   May benefit from outpatient palliative referral.    Goals of Care and Additional Recommendations:  Limitations on Scope of Treatment: Full Scope Treatment  Code Status: FULL   Code Status Orders  (From admission, onward)         Start     Ordered   04/09/2019 2031  Full code  Continuous     03/30/2019 2031        Code Status History    Date Active Date Inactive Code Status Order ID Comments User Context   03/21/2019 2041 03/24/2019 1625 Full Code [id number]  Meredeth Ide, MD Inpatient   07/24/2018 2043 07/26/2018 1546 Full Code [id number]  Tonye Royalty, DO Inpatient   10/05/2017 0158 10/08/2017 1916 Full Code [id number]  Clydie Braun, MD ED   04/09/2017 2020 04/13/2017 1904 Full Code [id number]  Michael Litter, MD ED   02/11/2017 0851 02/12/2017 1705 Full Code [id number]  Haydee Salter, MD ED   01/30/2017 0305 02/06/2017 2122 Full Code [id number]  Danford, Earl Lites, MD Inpatient       Prognosis:   Unable to determine  Discharge Planning:  To Be Determined  Care plan was discussed with patient, daughter,  RN  Thank you for allowing the Palliative Medicine Team to assist in the care of this patient.   Time In: 0925 Time Out: 0950 Total Time 25 Prolonged Time Billed no      Greater than 50%  of this time was spent counseling and coordinating care related to the above assessment and plan.  Vennie Homans, FNP-C Palliative Medicine Team  Phone: [phone] Fax: [phone]  Please contact Palliative Medicine Team phone at [phone] for questions and concerns.

## 2019-04-19 NOTE — TOC Progression Note (Signed)
Transition of Care [facility]) - Progression Note    Patient Details  Name: April [name] MRN: [id number] Date of Birth: [birth year]  Transition of Care [facility]) CM/SW Contact  Shalan Neault, Olegario Messier, RN Phone Number: 04/19/2019, 11:02 AM  Clinical Narrative: Sherron Monday to dtr(Bobbi)/patient on speaker phone-CM explained reccomendations of SNF,palliative GOC-full scope,readmissions-they both pleasantly decline SNF, & choose to go home w/[facility] rep Clydie Braun following-recc HHRN/PT/OT/aide/CSW-family agree. They also agree to [facility] @ d/c. Noted on 02-will monitor if needed @ home-can provide w/home 02 order, & documented qualifying 02 sats.    Expected Discharge Plan: Home w Home Health Services Barriers to Discharge: Continued Medical Work up  Expected Discharge Plan and Services Expected Discharge Plan: Home w Home Health Services In-house Referral: Clinical Social Work   Post Acute Care Choice: Home Health(vs SNF) Living arrangements for the past 2 months: Single Family Home Expected Discharge Date: (unknown)                         HH Arranged: ([facility] Agency: [facility]         Social Determinants of Health (SDOH) Interventions    Readmission Risk Interventions No flowsheet data found.

## 2019-04-19 NOTE — Care Management Important Message (Signed)
Important Message  Patient Details IM Letter given to [facility] Case Manager to present to the Patient Name: April [name] MRN: [id number] Date of Birth: [birth year]   Medicare Important Message Given:  Yes    Caren Macadam 04/19/2019, 10:44 AM

## 2019-04-20 ENCOUNTER — Inpatient Hospital Stay (HOSPITAL_COMMUNITY): Payer: PPO

## 2019-04-20 LAB — CBC WITH DIFFERENTIAL/PLATELET
Abs Immature Granulocytes: 0.08 10*3/uL — ABNORMAL HIGH (ref 0.00–0.07)
Basophils Absolute: 0.1 10*3/uL (ref 0.0–0.1)
Basophils Relative: 0 %
Eosinophils Absolute: 0 10*3/uL (ref 0.0–0.5)
Eosinophils Relative: 0 %
HCT: 24.9 % — ABNORMAL LOW (ref 36.0–46.0)
Hemoglobin: 7.7 g/dL — ABNORMAL LOW (ref 12.0–15.0)
Immature Granulocytes: 1 %
Lymphocytes Relative: 7 %
Lymphs Abs: 0.9 10*3/uL (ref 0.7–4.0)
MCH: 33.6 pg (ref 26.0–34.0)
MCHC: 30.9 g/dL (ref 30.0–36.0)
MCV: 108.7 fL — ABNORMAL HIGH (ref 80.0–100.0)
Monocytes Absolute: 0.8 10*3/uL (ref 0.1–1.0)
Monocytes Relative: 7 %
Neutro Abs: 10.4 10*3/uL — ABNORMAL HIGH (ref 1.7–7.7)
Neutrophils Relative %: 85 %
Platelets: 507 10*3/uL — ABNORMAL HIGH (ref 150–400)
RBC: 2.29 MIL/uL — ABNORMAL LOW (ref 3.87–5.11)
RDW: 15.1 % (ref 11.5–15.5)
WBC: 12.3 10*3/uL — ABNORMAL HIGH (ref 4.0–10.5)
nRBC: 0 % (ref 0.0–0.2)

## 2019-04-20 LAB — CULTURE, BLOOD (ROUTINE X 2)
Special Requests: ADEQUATE
Special Requests: ADEQUATE

## 2019-04-20 LAB — COMPREHENSIVE METABOLIC PANEL
ALT: 22 U/L (ref 0–44)
AST: 29 U/L (ref 15–41)
Albumin: 2 g/dL — ABNORMAL LOW (ref 3.5–5.0)
Alkaline Phosphatase: 136 U/L — ABNORMAL HIGH (ref 38–126)
Anion gap: 7 (ref 5–15)
BUN: 14 mg/dL (ref 8–23)
CO2: 26 mmol/L (ref 22–32)
Calcium: 8.4 mg/dL — ABNORMAL LOW (ref 8.9–10.3)
Chloride: 103 mmol/L (ref 98–111)
Creatinine, Ser: <0.3 mg/dL — ABNORMAL LOW (ref 0.44–1.00)
Glucose, Bld: 155 mg/dL — ABNORMAL HIGH (ref 70–99)
Potassium: 3.1 mmol/L — ABNORMAL LOW (ref 3.5–5.1)
Sodium: 136 mmol/L (ref 135–145)
Total Bilirubin: 0.4 mg/dL (ref 0.3–1.2)
Total Protein: 5.2 g/dL — ABNORMAL LOW (ref 6.5–8.1)

## 2019-04-20 LAB — GLUCOSE, CAPILLARY
Glucose-Capillary: 188 mg/dL — ABNORMAL HIGH (ref 70–99)
Glucose-Capillary: 138 mg/dL — ABNORMAL HIGH (ref 70–99)
Glucose-Capillary: 106 mg/dL — ABNORMAL HIGH (ref 70–99)
Glucose-Capillary: 101 mg/dL — ABNORMAL HIGH (ref 70–99)

## 2019-04-20 LAB — MAGNESIUM: Magnesium: 1.7 mg/dL (ref 1.7–2.4)

## 2019-04-20 MED ORDER — METOPROLOL TARTRATE 25 MG PO TABS
37.5000 mg | ORAL_TABLET | Freq: Two times a day (BID) | ORAL | Status: DC
  Administered 2019-04-20 – 2019-04-21 (×3): 37.5 mg via ORAL
  Filled 2019-04-20 (×3): qty 2

## 2019-04-20 MED ORDER — IOHEXOL 300 MG/ML  SOLN: 30.0000 mL | Freq: Once | INTRAMUSCULAR | Status: DC | PRN

## 2019-04-20 MED ORDER — IOHEXOL 300 MG/ML  SOLN
100.0000 mL | Freq: Once | INTRAMUSCULAR | Status: AC | PRN
  Administered 2019-04-20: 80 mL via INTRAVENOUS

## 2019-04-20 MED ORDER — POTASSIUM CHLORIDE CRYS ER 20 MEQ PO TBCR
40.0000 meq | EXTENDED_RELEASE_TABLET | Freq: Every day | ORAL | Status: DC
  Administered 2019-04-20 – 2019-04-21 (×2): 40 meq via ORAL
  Filled 2019-04-20 (×2): qty 2

## 2019-04-20 NOTE — Progress Notes (Signed)
SLP to sign off at this time, if MD is concerned re: potential OVERT aspiration would recommend for pt undergo MBS.  ? If she would wish for diet modification or treatment.  Suspect her mentation, ETOH hx, poor dentition and GERD are large risk factors of which SLP has educated her to mitigation strategies.    [name], MS [facility] SLP Acute Rehab Services Pager [phone] Office [phone]

## 2019-04-20 NOTE — Progress Notes (Signed)
   04/19/19 1819  MEWS Score  Resp 18  Pulse Rate (!) 134  BP 113/65  Temp (!) 101.5 F (38.6 C)  SpO2 99 %  O2 Device Room Air  MEWS Score  MEWS RR 0  MEWS Pulse 3  MEWS Systolic 0  MEWS LOC 0  MEWS Temp 2  MEWS Score 5  MEWS Score Color Red  MEWS Assessment  Is this an acute change? Yes (pt had done this before so did not call [facility])  MEWS guidelines implemented *See Row Information* Red  Provider Notification  Provider Name/Title Dr. Mahala Menghini  Date Provider Notified 04/19/19  Time Provider Notified 1837  Notification Type Page  Notification Reason Change in status  Response See new orders  Date of Provider Response 04/19/19  Time of Provider Response 1840

## 2019-04-20 NOTE — Progress Notes (Addendum)
PROGRESS NOTE    KENEDIE GAJDA  ASU:[id number] DOB: 21-Feb-1946 DOA: 03/15/2019 PCP: Richmond Campbell., PA-C   Brief Narrative:    73 y.o. CF  bipolar, continued tobacco use, ,alcohol abuse, GERD, hypertension, hyperlipidemia, pancreatitis, recent left intertrochanteric femur fracture status post intervention with intramedullary nail placement on 03/22/2019 presenting to the hospital for evaluation of hip pain and UTI symptoms. Limited history on admission  States she drinks 3 shot glasses of whiskey every day.  She is not able to tell me when her last drink was.  Also reports smoking 1 pack of cigarettes every day.  Ultimately found after admitted with sepsis secondary to acute pancreatitis and urinary tract infection.  Her hospitalization has been complicated by stress-induced cardiomyopathy, acute cardiogenic pulmonary edema and acute hypoxic respiratory failure.  Hospitalization complicated by fever.  Patient now with positive blood cultures, GNR.  Assessment & Plan   Recurrent sepsis secondary to urinary tract infection/Proteus mirabilis and Enterobacter  SIRS on admission from pancreatitis Dysphagia 3 diet-?risk aspiration Sepsis on admission- grossly scabbed ulcers blood cultures 04/09/2019 showed no growth to date -Patient was placed on IV antibiotics and recently completed 7-day course (antibiotics were discontinued 04/12/2019) -Blood cultures 03/28/2019 showed Proteus mirabilis (pansensitive) -Patient has been spiking fevers over the past 24 hours, blood cultures were repeated 04/17/2019 with Enterobacter, Proteus -Patient with drug allergies to PCN and cephalosporins--currently on Azactam second round of antibiotics since 5/5 -Spiked fever 5/6 so obtain 1 more blood culture which is pending -Patient had TEE 04/07/2019 which did not show vegetation -Check 2 vw Xray non emergently today, also will check CT abdomen pelvis given prior intra-abd lesions and possible sources Acute  systolic heart failure/stress-induced cardiomyopathy with acute cardiogenic pulmonary edema/acute hypoxic and hypercapnic respiratory failure -Chest x-ray showed pulmonary opacities, bilateral pulmonary infiltrates.  Stable pleural effusions -BNP 119.5 -Echocardiogram EF of 35 to 40%.  LV diastolic function cannot be evaluated.  Entire mid to apical region appears hypokinetic with hyperdynamic base. - not appear to be hypervolemic-Continue lasix -no ACEi/ARB given soft BP -Metoprolol---> back to 37.5 -Monitor intake and output, daily weights Hypomagnesemia--all precipitated by Chronic ETOH Hypokalemia Check a.m. labs Also hypokalemic so replacing Hypocalcemia -Likely due to malnutrition and alcoholism -Corrected calcium within normal range NAGMA with hyponatremia and hypochloremia Resolved Acute pancreatitis complicated with pseudocyst and portal vein thrombosis CT abdomen pelvis showed nonocclusive splenic vein/portal vein thrombus GI consulted -soft diet with 3 to 6 months of anticoagulation with apixaban.  Continue Florastor probiotic for 2 weeks after completion of antibiotic treatment Patient initially placed on heparin, transitioned to Lovenox coronary-Transitioned to Eliquis Acute metabolic encephalopathy/ sundowning -Patient currently awake, alert and oriented x3 -Continue thiamine and multivitamins -Seems that in the evening hours, sundown -Continue home dose of Klonopin -Stop all pain meds other than tylenol Alcohol abuse with potential of withdrawal -Resolved Macrocytic anemia -likely due to alcoholism -Stable, Hemoglobin 7 range -transfuse if below 7 Severe calorie protein malnutrition Suspect secondary to alcoholismnutrition consulted Continue supplements Hyperglycemia -No diagnosis of diabetes mellitus -Hemoglobin A1c 5.9 -Continue insulin sliding scale and CBG monitoring-sugars 150-220 Deconditioning -PT recommended SNF -Discussed this with patient as well as  daughter.  Patient would like to return home but understands that she is weak.- will need SNF -Case manager and social work consulted Goals of care -Discussed with daughter, she knows her mother would not want to be a vegetable but they have not discussed DNR per se. -Palliative care consulted and appreciated  DVT Prophylaxis Eliquis  Code Status: Full Family Communication: None at bedside.   Disposition Plan: Admitted.  Disposition pending.  Patient now with GNR bacteremia.  Consultants Gastroenterology Palliative care  Procedures  None    Subjective:   Alert much more coherent Asking "why my still here" No fever no chills Note patient had a fever on 5/6 p.m. and was tachycardic No chest pain No diarrhea No lower extremity edema  Objective:   Vitals:   04/20/19 0450 04/20/19 0700 04/20/19 0750 04/20/19 0904  BP: 116/67 108/70 113/65 112/64  Pulse: (!) 101   98  Resp: 17  18   Temp: 98.4 F (36.9 C)  99.6 F (37.6 C)   TempSrc: Oral  Oral   SpO2:   95%   Weight:      Height:        Intake/Output Summary (Last 24 hours) at 04/20/2019 0953 Last data filed at 04/20/2019 0200 Gross per 24 hour  Intake 1922.95 ml  Output 300 ml  Net 1622.95 ml   Filed Weights   04/16/19 0400 04/18/19 0546 04/19/19 0530  Weight: 44.8 kg 40.9 kg 42.5 kg   Exam Cachectic pleasant Caucasian female very poor dentition EOMI NCAT no added sound to chest Neck is soft supple Bilateral rales posterolaterally Abdomen is slightly distended nontender except in epigastrium no shifting dullness No lower extremity edema Bilateral bruising over arms from frail skin Euthymic congruent smile symmetric     Data Reviewed: I have personally reviewed following labs and imaging studies  CBC: Recent Labs  Lab 04/14/19 0248 04/15/19 0235  04/17/19 0518 04/18/19 0539 04/18/19 1136 04/19/19 0519 04/20/19 0919  WBC 11.9* 9.6  --   --  11.9*  --  10.0 12.3*  NEUTROABS  --   --   --   --   --    --   --  10.4*  HGB 8.3* 8.2*   < > 8.2* 7.5* 7.7* 7.3* 7.7*  HCT 27.3* 25.7*   < > 26.6* 24.6* 25.5* 23.4* 24.9*  MCV 110.1* 110.3*  --   --  111.8*  --  110.4* 108.7*  PLT 393 414*  --   --  392  --  424* 507*   < > = values in this interval not displayed.   Basic Metabolic Panel: Recent Labs  Lab 04/16/19 0330 04/17/19 0518 04/18/19 0539 04/19/19 0519 04/20/19 0919  NA 134* 135 136 134* 136  K 4.1 3.7 3.5 3.5 3.1*  CL 96* 97* 96* 97* 103  CO2 32 GLUCOSE 170* 142* 132* 187* 155*  BUN CREATININE 0.37* 0.34* 0.30* <0.30* <0.30*  CALCIUM 8.5* 8.5* 8.3* 8.2* 8.4*  MG 1.9  --   --   --  1.7   GFR: CrCl cannot be calculated (This lab value cannot be used to calculate CrCl because it is not a number: <0.30). Liver Function Tests: Recent Labs  Lab 04/19/19 0519 04/20/19 0919  AST 19 29  ALT 15 22  ALKPHOS 99 136*  BILITOT 0.3 0.4  PROT 5.1* 5.2*  ALBUMIN 2.0* 2.0*   No results for input(s): LIPASE, AMYLASE in the last 168 hours. No results for input(s): AMMONIA in the last 168 hours. Coagulation Profile: No results for input(s): INR, PROTIME in the last 168 hours. Cardiac Enzymes: No results for input(s): CKTOTAL, CKMB, CKMBINDEX, TROPONINI in the last 168 hours. BNP (last 3 results) No results for input(s): PROBNP in the last 8760 hours.  HbA1C: No results for input(s): HGBA1C in the last 72 hours. CBG: Recent Labs  Lab 04/19/19 0740 04/19/19 1209 04/19/19 1636 04/19/19 2145 04/20/19 0737  GLUCAP 150* 287* 158* 228* 188*   Lipid Profile: No results for input(s): CHOL, HDL, LDLCALC, TRIG, CHOLHDL, LDLDIRECT in the last 72 hours. Thyroid Function Tests: No results for input(s): TSH, T4TOTAL, FREET4, T3FREE, THYROIDAB in the last 72 hours. Anemia Panel: No results for input(s): VITAMINB12, FOLATE, FERRITIN, TIBC, IRON, RETICCTPCT in the last 72 hours. Urine analysis:    Component Value Date/Time   COLORURINE AMBER (A)  04/17/2019 1409   APPEARANCEUR CLOUDY (A) 04/17/2019 1409   LABSPEC 1.010 04/17/2019 1409   PHURINE 7.0 04/17/2019 1409   GLUCOSEU NEGATIVE 04/17/2019 1409   HGBUR SMALL (A) 04/17/2019 1409   BILIRUBINUR NEGATIVE 04/17/2019 1409   KETONESUR NEGATIVE 04/17/2019 1409   PROTEINUR NEGATIVE 04/17/2019 1409   NITRITE NEGATIVE 04/17/2019 1409   LEUKOCYTESUR SMALL (A) 04/17/2019 1409   Sepsis Labs: (procalcitonin:4,lacticidven:4)  ) Recent Results (from the past 240 hour(s))  Culture, Urine     Status: Abnormal   Collection Time: 04/17/19  2:09 PM  Result Value Ref Range Status   Specimen Description   Final    URINE, CLEAN CATCH Performed at [facility], [address], [place], [place] [address]    Special Requests   Final    NONE Performed at [facility], [address] [place], [place] [address]    Culture MULTIPLE SPECIES PRESENT, SUGGEST RECOLLECTION (A)  Final   Report Status 04/19/2019 FINAL  Final  Culture, blood (routine x 2)     Status: Abnormal   Collection Time: 04/17/19  2:48 PM  Result Value Ref Range Status   Specimen Description   Final    RIGHT ANTECUBITAL Performed at [facility], [address] [place], [place] [address]    Special Requests   Final    BOTTLES DRAWN AEROBIC AND ANAEROBIC Blood Culture adequate volume Performed at [facility], [address], [place], [place] [address]    Culture  Setup Time   Final    GRAM NEGATIVE RODS IN BOTH AEROBIC AND ANAEROBIC BOTTLES CRITICAL RESULT CALLED TO, READ BACK BY AND VERIFIED WITH: M. RENZ PHARMD, AT 2956 04/18/19 BY D. VANHOOK    Culture (A)  Final    PROTEUS MIRABILIS SUSCEPTIBILITIES PERFORMED ON PREVIOUS CULTURE WITHIN THE LAST 5 DAYS. Performed at [facility], [address], [place], [place] [address]    Report Status 04/20/2019 FINAL  Final  Blood Culture ID Panel (Reflexed)     Status: Abnormal    Collection Time: 04/17/19  2:53 PM  Result Value Ref Range Status   Enterococcus species NOT DETECTED NOT DETECTED Final   Listeria monocytogenes NOT DETECTED NOT DETECTED Final   Staphylococcus species NOT DETECTED NOT DETECTED Final   Staphylococcus aureus (BCID) NOT DETECTED NOT DETECTED Final   Streptococcus species NOT DETECTED NOT DETECTED Final   Streptococcus agalactiae NOT DETECTED NOT DETECTED Final   Streptococcus pneumoniae NOT DETECTED NOT DETECTED Final   Streptococcus pyogenes NOT DETECTED NOT DETECTED Final   Acinetobacter baumannii NOT DETECTED NOT DETECTED Final   Enterobacteriaceae species DETECTED (A) NOT DETECTED Final    Comment: Enterobacteriaceae represent a large family of gram-negative bacteria, not a single organism. CRITICAL RESULT CALLED TO, READ BACK BY AND VERIFIED WITH: Becky Sax PHARMD, AT 1040 04/18/19 BY D. VANHOOK    Enterobacter cloacae  complex NOT DETECTED NOT DETECTED Final   Escherichia coli NOT DETECTED NOT DETECTED Final   Klebsiella oxytoca NOT DETECTED NOT DETECTED Final   Klebsiella pneumoniae NOT DETECTED NOT DETECTED Final   Proteus species DETECTED (A) NOT DETECTED Final    Comment: CRITICAL RESULT CALLED TO, READ BACK BY AND VERIFIED WITH: Becky Sax PHARMD, AT 1040 04/18/19 BY D. VANHOOK    Serratia marcescens NOT DETECTED NOT DETECTED Final   Carbapenem resistance NOT DETECTED NOT DETECTED Final   Haemophilus influenzae NOT DETECTED NOT DETECTED Final   Neisseria meningitidis NOT DETECTED NOT DETECTED Final   Pseudomonas aeruginosa NOT DETECTED NOT DETECTED Final   Candida albicans NOT DETECTED NOT DETECTED Final   Candida glabrata NOT DETECTED NOT DETECTED Final   Candida krusei NOT DETECTED NOT DETECTED Final   Candida parapsilosis NOT DETECTED NOT DETECTED Final   Candida tropicalis NOT DETECTED NOT DETECTED Final    Comment: Performed at [facility], [address], [place], [place] [address]  Culture, blood (routine x 2)      Status: Abnormal   Collection Time: 04/17/19  2:54 PM  Result Value Ref Range Status   Specimen Description   Final    BLOOD RIGHT HAND Performed at [facility], [address], [place], [place] [address]    Special Requests   Final    BOTTLES DRAWN AEROBIC AND ANAEROBIC Blood Culture adequate volume Performed at [facility], [address] [place], [place] [address]    Culture  Setup Time   Final    GRAM NEGATIVE RODS IN BOTH AEROBIC AND ANAEROBIC BOTTLES CRITICAL VALUE NOTED.  VALUE IS CONSISTENT WITH PREVIOUSLY REPORTED AND CALLED VALUE.    Culture PROTEUS MIRABILIS (A)  Final   Report Status 04/20/2019 FINAL  Final   Organism ID, Bacteria PROTEUS MIRABILIS  Final      Susceptibility   Proteus mirabilis - MIC*    AMPICILLIN <=2 SENSITIVE Sensitive     CEFAZOLIN <=4 SENSITIVE Sensitive     CEFEPIME <=1 SENSITIVE Sensitive     CEFTAZIDIME <=1 SENSITIVE Sensitive     CEFTRIAXONE <=1 SENSITIVE Sensitive     CIPROFLOXACIN <=0.25 SENSITIVE Sensitive     GENTAMICIN <=1 SENSITIVE Sensitive     IMIPENEM 2 SENSITIVE Sensitive     TRIMETH/SULFA <=20 SENSITIVE Sensitive     AMPICILLIN/SULBACTAM <=2 SENSITIVE Sensitive     PIP/TAZO <=4 SENSITIVE Sensitive     * PROTEUS MIRABILIS  Culture, blood (single)     Status: None (Preliminary result)   Collection Time: 04/19/19  7:18 PM  Result Value Ref Range Status   Specimen Description   Final    BLOOD LEFT HAND Performed at [facility], [address], [place], [place] [address]    Special Requests   Final    BOTTLES DRAWN AEROBIC AND ANAEROBIC Blood Culture adequate volume Performed at [facility], [address], [place], [place] [address]    Culture   Final    NO GROWTH < 12 HOURS Performed at [facility], [address], [place], [place] [address]    Report Status PENDING  Incomplete      Radiology Studies: No results found.    Scheduled Meds: . amitriptyline  25 mg Oral QHS  . apixaban  5 mg Oral BID  . feeding supplement  1 Container Oral BID BM  . feeding supplement (ENSURE ENLIVE)  237 mL Oral TID BM  .  feeding supplement (PRO-STAT SUGAR FREE 64)  30 mL Oral Daily  . folic acid  1 mg Oral Daily  . furosemide  20 mg Oral Daily  . insulin aspart  0-9 Units Subcutaneous TID WC  . mouth rinse  15 mL Mouth Rinse BID  . metoprolol tartrate  25 mg Oral BID  . multivitamin with minerals  1 tablet Oral Daily  . nicotine  21 mg Transdermal Daily  . polyethylene glycol  17 g Oral Daily  . saccharomyces boulardii  250 mg Oral BID  . thiamine  100 mg Oral Daily   Continuous Infusions: . sodium chloride 50 mL/hr at 04/19/19 1500  . aztreonam 2 g (04/20/19 0912)     LOS: 15 days   Time Spent in minutes  30  Pleas KochJai Malachai Schalk, MD [facility] 9:53 AM

## 2019-04-20 NOTE — Progress Notes (Signed)
Physical Therapy Treatment Patient Details Name: April [name] MRN: [id number] DOB: April 12, 1946 Today's Date: 04/20/2019    History of Present Illness 73 yo female admitted to ED on 4/22 from home, with diagnosis of sepsis secondary to UTI, acute pancreatitis complicated by pancreatic psuedocyst, portal vein thrombosis. Pt additionaly with acute heart failure, acute cardiogenic pulmonary edema, and alcohol withdrawals. Pt recently hospitalized for L hip fracture and ORIF due to falls at home. PMH includes alcohol abuse, HTN, HLD, anxiety, depression, neuropathy.     PT Comments    Pt encouraged to perform OOB to [facility] and then assisted to recliner to eat lunch.  Continue to recommend SNF upon d/c.  Pt does require physical assist for any mobility tasks.  Follow Up Recommendations  SNF;Supervision/Assistance - 24 hour     Equipment Recommendations  None recommended by PT    Recommendations for Other Services       Precautions / Restrictions Precautions Precautions: Fall Restrictions Other Position/Activity Restrictions: LLE WBAT per chart review    Mobility  Bed Mobility Overal bed mobility: Needs Assistance Bed Mobility: Supine to Sit     Supine to sit: Mod assist;HOB elevated     General bed mobility comments: verbal cues for technique, assist for LEs to EOB and trunk upright  Transfers Overall transfer level: Needs assistance Equipment used: Rolling walker (2 wheeled) Transfers: Sit to/from [employer] Sit to Stand: Mod assist;+2 safety/equipment Stand pivot transfers: Mod assist;+2 safety/equipment       General transfer comment: pt assisted with stand pivot to [facility] and then over to recliner, nurse tech assisted with pericare; pt fatigues quickly  Ambulation/Gait                 Stairs             Wheelchair Mobility    Modified Rankin (Stroke Patients Only)       Balance Overall balance assessment: Needs assistance;History  of Falls         Standing balance support: Bilateral upper extremity supported Standing balance-Leahy Scale: Poor Standing balance comment: requires UE support to stand                             Cognition Arousal/Alertness: Awake/alert Behavior During Therapy: WFL for tasks assessed/performed Overall Cognitive Status: No family/caregiver present to determine baseline cognitive functioning                       Memory: Decreased short-term memory Following Commands: Follows one step commands consistently Safety/Judgement: Decreased awareness of safety   Problem Solving: Requires verbal cues;Difficulty sequencing        Exercises      General Comments        Pertinent Vitals/Pain Pain Assessment: Faces Faces Pain Scale: Hurts even more Pain Location: left flank and abdomen Pain Descriptors / Indicators: Aching;Sore Pain Intervention(s): Monitored during session;Repositioned;RN gave pain meds during session    Home Living                      Prior Function            PT Goals (current goals can now be found in the care plan section) Progress towards PT goals: Progressing toward goals    Frequency    Min 2X/week      PT Plan Current plan remains appropriate    Co-evaluation  AM-PAC PT "6 Clicks" Mobility   Outcome Measure  Help needed turning from your back to your side while in a flat bed without using bedrails?: A Lot Help needed moving from lying on your back to sitting on the side of a flat bed without using bedrails?: A Lot Help needed moving to and from a bed to a chair (including a wheelchair)?: A Lot Help needed standing up from a chair using your arms (e.g., wheelchair or bedside chair)?: A Lot Help needed to walk in hospital room?: A Lot Help needed climbing 3-5 steps with a railing? : Total 6 Click Score: 11    End of Session Equipment Utilized During Treatment: Gait belt Activity  Tolerance: Patient limited by fatigue Patient left: in chair;with call bell/phone within reach;with chair alarm set;with nursing/sitter in room Nurse Communication: Mobility status PT Visit Diagnosis: Muscle weakness (generalized) (M62.81)     Time: [id number] PT Time Calculation (min) (ACUTE ONLY): 17 min  Charges:  $Therapeutic Activity: 8-22 mins                     Zenovia JarredKati Kassius Battiste, PT, DPT Acute Rehabilitation Services Office: [phone] Pager: [phone]  Sarajane JewsLEMYRE,KATHrine E 04/20/2019, 4:12 PM

## 2019-04-21 LAB — COMPREHENSIVE METABOLIC PANEL
ALT: 21 U/L (ref 0–44)
AST: 24 U/L (ref 15–41)
Albumin: 2.2 g/dL — ABNORMAL LOW (ref 3.5–5.0)
Alkaline Phosphatase: 136 U/L — ABNORMAL HIGH (ref 38–126)
Anion gap: 10 (ref 5–15)
BUN: 11 mg/dL (ref 8–23)
CO2: 24 mmol/L (ref 22–32)
Calcium: 8.4 mg/dL — ABNORMAL LOW (ref 8.9–10.3)
Chloride: 101 mmol/L (ref 98–111)
Creatinine, Ser: <0.3 mg/dL — ABNORMAL LOW (ref 0.44–1.00)
Glucose, Bld: 120 mg/dL — ABNORMAL HIGH (ref 70–99)
Potassium: 3.4 mmol/L — ABNORMAL LOW (ref 3.5–5.1)
Sodium: 135 mmol/L (ref 135–145)
Total Bilirubin: 1 mg/dL (ref 0.3–1.2)
Total Protein: 5.7 g/dL — ABNORMAL LOW (ref 6.5–8.1)

## 2019-04-21 LAB — GLUCOSE, CAPILLARY
Glucose-Capillary: 130 mg/dL — ABNORMAL HIGH (ref 70–99)
Glucose-Capillary: 143 mg/dL — ABNORMAL HIGH (ref 70–99)
Glucose-Capillary: 136 mg/dL — ABNORMAL HIGH (ref 70–99)
Glucose-Capillary: 157 mg/dL — ABNORMAL HIGH (ref 70–99)

## 2019-04-21 LAB — CBC WITH DIFFERENTIAL/PLATELET
Abs Immature Granulocytes: 0.1 10*3/uL — ABNORMAL HIGH (ref 0.00–0.07)
Basophils Absolute: 0.1 10*3/uL (ref 0.0–0.1)
Basophils Relative: 0 %
Eosinophils Absolute: 0.1 10*3/uL (ref 0.0–0.5)
Eosinophils Relative: 0 %
HCT: 27.9 % — ABNORMAL LOW (ref 36.0–46.0)
Hemoglobin: 8.3 g/dL — ABNORMAL LOW (ref 12.0–15.0)
Immature Granulocytes: 1 %
Lymphocytes Relative: 7 %
Lymphs Abs: 1 10*3/uL (ref 0.7–4.0)
MCH: 32.2 pg (ref 26.0–34.0)
MCHC: 29.7 g/dL — ABNORMAL LOW (ref 30.0–36.0)
MCV: 108.1 fL — ABNORMAL HIGH (ref 80.0–100.0)
Monocytes Absolute: 0.9 10*3/uL (ref 0.1–1.0)
Monocytes Relative: 6 %
Neutro Abs: 11.9 10*3/uL — ABNORMAL HIGH (ref 1.7–7.7)
Neutrophils Relative %: 86 %
Platelets: 657 10*3/uL — ABNORMAL HIGH (ref 150–400)
RBC: 2.58 MIL/uL — ABNORMAL LOW (ref 3.87–5.11)
RDW: 15.3 % (ref 11.5–15.5)
WBC: 13.9 10*3/uL — ABNORMAL HIGH (ref 4.0–10.5)
nRBC: 0 % (ref 0.0–0.2)

## 2019-04-21 LAB — LIPASE, BLOOD: Lipase: 203 U/L — ABNORMAL HIGH (ref 11–51)

## 2019-04-21 NOTE — Progress Notes (Signed)
Pharmacy Antibiotic Note  April [name] is a 73 y.o. female admitted on 03/25/2019 with dysuria and weakness. Pt found to have proteus bacteremia.  Pharmacy was consulted for aztreonam dosing.  Assessment:  WBC 13.9 remains slightly elevated.  SCr - stable.    Afebrile  This is day #3 of full dose IV aztreonam. Patient currently on second round of antibiotics.   Plan:  Continue aztreonam 2 gm IV q8h  Follow cultures  Height: 5\' 6"  (167.6 cm) Weight: 96 lb 1.6 oz (43.6 kg) IBW/kg (Calculated) : 59.3  Temp (24hrs), Avg:98.7 F (37.1 C), Min:98.6 F (37 C), Max:98.8 F (37.1 C)  Recent Labs  Lab 04/15/19 0235  04/17/19 0518 04/18/19 0539 04/19/19 0519 04/20/19 0919 04/21/19 0504  WBC 9.6  --   --  11.9* 10.0 12.3* 13.9*  CREATININE 0.30*   < > 0.34* 0.30* <0.30* <0.30* <0.30*   < > = values in this interval not displayed.    CrCl cannot be calculated (This lab value cannot be used to calculate CrCl because it is not a number: <0.30).    Allergies  Allergen Reactions  . Naltrexone Shortness Of Breath  . Rocephin [Ceftriaxone Sodium In Dextrose] Anaphylaxis  . Penicillins Rash    Has patient had a PCN reaction causing immediate rash, facial/tongue/throat swelling, SOB or lightheadedness with hypotension: Yes immediate rash  Has patient had a PCN reaction causing severe rash involving mucus membranes or skin necrosis: Yes Has patient had a PCN reaction that required hospitalization": No Has patient had a PCN reaction occurring within the last 10 years: Yes If all of the above answers are "NO", then may proceed with Cephalosporin use.     Antimicrobials this admission:  4/22 aztreonam>> 4/25;   5/5>> 4/22 flagyl>> 4/23  5/5>> 4/22 cipro>>4/23 ; resumed 4/25 >>4/29  Dose adjustments this admission:   Microbiology results:  Prev Cx 4/7 UCx: mult sp-final 4/8 MRSA PCR: neg  4/22 BCx x2:  2/2 proteus mirabilis (pans sens) FINAL 4/22 UCx: mult sp-final 4/26  bcx x2: neg FINAL 5/4 BCx2: 4/4 proteus, pan-sensitive 5/6 BCx: ngtd  Thank you for allowing pharmacy to be a part of this patient's care.  Cindi Carbon, PharmD 04/21/19 9:43 AM

## 2019-04-21 NOTE — Progress Notes (Signed)
Occupational Therapy Treatment Patient Details Name: April PoissonDeborah S [name] MRN: [id number] DOB: 06/01/1946 Today's Date: 04/21/2019    History of present illness 73 yo female admitted to ED on 4/22 from home, with diagnosis of sepsis secondary to UTI, acute pancreatitis complicated by pancreatic psuedocyst, portal vein thrombosis. Pt additionaly with acute heart failure, acute cardiogenic pulmonary edema, and alcohol withdrawals. Pt recently hospitalized for L hip fracture and ORIF due to falls at home. PMH includes alcohol abuse, HTN, HLD, anxiety, depression, neuropathy.    OT comments  Pt needed more assistance for UB dressing today. Incontinent x B&B. Assisted with cleaning up and to chair.  Decreased STM  Follow Up Recommendations  SNF    Equipment Recommendations  None recommended by OT    Recommendations for Other Services      Precautions / Restrictions Precautions Precautions: Fall Restrictions Other Position/Activity Restrictions: LLE WBAT per chart review       Mobility Bed Mobility         Supine to sit: Min assist     General bed mobility comments: assist for trunk. Pt used rail  Transfers       Sit to Stand: Mod assist;+2 safety/equipment Stand pivot transfers: Mod assist;+2 safety/equipment       General transfer comment: assist to rise and stabilize    Balance             Standing balance-Leahy Scale: Poor                             ADL either performed or assessed with clinical judgement   ADL               Lower Body Bathing: Maximal assistance   Upper Body Dressing : Moderate assistance       Toilet Transfer: Moderate assistance;+2 for safety/equipment   Toileting- Clothing Manipulation and Hygiene: Total assistance;+2 for safety/equipment         General ADL Comments: assisted with hygiene and to chair.     Vision       Perception     Praxis      Cognition Arousal/Alertness: Awake/alert Behavior  During Therapy: WFL for tasks assessed/performed Overall Cognitive Status: No family/caregiver present to determine baseline cognitive functioning                                 General Comments: decreased memory; did not think she was up and she sat up most of the morning. Incontinent x B&B and did not call for assistance.  Did not recall I said no visitors 2* virus, asked for pain meds twice within 5 minutes when told she could have them in 30 min        Exercises     Shoulder Instructions       General Comments Pt stated she felt lightheaded in chair. BP 125/75    Pertinent Vitals/ Pain       Pain Assessment: Faces Faces Pain Scale: Hurts even more Pain Location: left flank and abdomen Pain Descriptors / Indicators: Aching;Sore Pain Intervention(s): Limited activity within patient's tolerance;Monitored during session;Repositioned;Patient requesting pain meds-RN notified  Home Living  Prior Functioning/Environment              Frequency  Min 2X/week        Progress Toward Goals  OT Goals(current goals can now be found in the care plan section)  Progress towards OT goals: Not progressing toward goals - comment     Plan      Co-evaluation                 AM-PAC OT "6 Clicks" Daily Activity     Outcome Measure   Help from another person eating meals?: None Help from another person taking care of personal grooming?: A Little Help from another person toileting, which includes using toliet, bedpan, or urinal?: Total Help from another person bathing (including washing, rinsing, drying)?: A Lot Help from another person to put on and taking off regular upper body clothing?: A Lot Help from another person to put on and taking off regular lower body clothing?: Total 6 Click Score: 13    End of Session    OT Visit Diagnosis: Muscle weakness (generalized) (M62.81) Pain - Right/Left:  Left Pain - part of body: Hip   Activity Tolerance Patient tolerated treatment well   Patient Left in chair;with call bell/phone within reach;with chair alarm set   Nurse Communication          Time: 9379-0240 OT Time Calculation (min): 24 min  Charges: OT General Charges $OT Visit: 1 Visit OT Treatments $Self Care/Home Management : 23-37 mins  Marica Otter, OTR/L Acute Rehabilitation Services [phone] [facility] pager [phone] office 04/21/2019   April [name] 04/21/2019, 2:25 PM

## 2019-04-21 NOTE — Progress Notes (Signed)
PROGRESS NOTE April [name]  ZOX:[id number] DOB: [birth year] DOA: 05-02-2019 PCP: Richmond Campbell., PA-C   Brief Narrative:    73 y.o. CF  bipolar, continued tobacco use, ,alcohol abuse, GERD, hypertension, hyperlipidemia, pancreatitis, recent left intertrochanteric femur fracture status post intervention with intramedullary nail placement on 03/22/2019 presenting to the hospital for evaluation of hip pain and UTI symptoms. Limited history on admission  States she drinks 3 shot glasses of whiskey every day.  She is not able to tell me when her last drink was.  Also reports smoking 1 pack of cigarettes every day.  Ultimately found after admitted with sepsis secondary to acute pancreatitis and urinary tract infection.  Her hospitalization has been complicated by stress-induced cardiomyopathy, acute cardiogenic pulmonary edema and acute hypoxic respiratory failure.  Hospitalization complicated by fever.  Patient now with positive blood cultures, GNR.  Assessment & Plan   Recurrent sepsis secondary to urinary tract infection/Proteus mirabilis and Enterobacter  SIRS on admission from pancreatitis Dysphagia 3 diet-?risk aspiration Sepsis on admission- grossly scabbed ulcers blood cultures 04/09/2019 showed no growth to date -Patient was placed on IV antibiotics and recently completed 7-day course (antibiotics were discontinued 04/12/2019) -Blood cultures 02-May-2019 showed Proteus mirabilis (pansensitive) -spiking fevers over the past 24 hours, blood cultures were repeated 04/17/2019 with Enterobacter, Proteus -Patient with drug allergies to PCN and cephalosporins--currently on Azactam second round of antibiotics since 5/5 -Spiked fever 5/6 so obtain 1 more blood culture which is pending -Patient had TEE 04/07/2019 which did not show vegetation -Chest x-ray unrevealing however CT scan performed 5/7 shows abscess versus pseudocyst Consulted general surgery whose input was appreciated-they feel no  role for surgery and reimaging-patient will need broad-spectrum antibiotics at least for the next several days and we can attempt delineation and de-escalation to orals however if she has further fevers we will have to have a clear discussion with family about planning-she is pretty malnourished and I do not know how good a surgical candidate she would be if that was to become the case Acute systolic heart failure/stress-induced cardiomyopathy with acute cardiogenic pulmonary edema/acute hypoxic and hypercapnic respiratory failure -Chest x-ray showed pulmonary opacities, bilateral pulmonary infiltrates.  Stable pleural effusions -BNP 119.5 -Echocardiogram EF of 35 to 40%.  LV diastolic function cannot be evaluated.  Entire mid to apical region appears hypokinetic with hyperdynamic base. - not appear to be hypervolemic-Continue lasix -no ACEi/ARB given soft BP -Metoprolol---> back to 37.5-still has sinus tachycardia although this is rate controlled -Monitor intake and output, daily weights Hypomagnesemia--all precipitated by Chronic ETOH Hypokalemia  hypokalemic so replacing with K. Dur 40 daily Hypocalcemia -Likely due to malnutrition and alcoholism -Corrected calcium within normal range NAGMA with hyponatremia and hypochloremia Resolved Acute pancreatitis complicated with pseudocyst and portal vein thrombosis CT abdomen pelvis showed nonocclusive splenic vein/portal vein thrombus-previously on heparin now on Eliquis see above discussion GI consulted -soft diet with 3 to 6 months of anticoagulation with apixaban.  Continue Florastor probiotic for 2 weeks after completion of antibiotic treatment Acute metabolic encephalopathy/ sundowning -Patient currently awake, alert and oriented x3 -Continue thiamine and multivitamins -Seems that in the evening hours, sundown -Continue home dose of Klonopin -Stop all pain meds other than tylenol Alcohol abuse with potential of withdrawal -Resolved  Macrocytic anemia -likely due to alcoholism -Stable, Hemoglobin 7 range -transfuse if below 7 Severe calorie protein malnutrition Suspect secondary to alcoholismnutrition consulted Continue supplements Hyperglycemia -No diagnosis of diabetes mellitus -Hemoglobin A1c 5.9 -Continue insulin sliding scale, CBGs 10 1-1 40s  Deconditioning -PT recommended SNF -Discussed this with patient as well as daughter.  Patient would like to return home but understands that she is weak.- will need SNF -Case manager and social work consulted Goals of care -Discussed with daughter, she knows her mother would not want to be a vegetable but they have not discussed DNR per se. -Palliative care consulted and appreciated  DVT Prophylaxis Eliquis Code Status: [place] Family Communication: Called daughter Reita Cliche on the phone Disposition Plan: Admitted.  Disposition pending.  Patient now with GNR bacteremia.  Consultants Gastroenterology Palliative care  Procedures  None  Subjective:   Had abdominal pain boring to her back No n/v no chills No cp  Objective:   Vitals:   04/20/19 1600 04/20/19 2123 04/21/19 0613 04/21/19 1407  BP: 106/62 122/76 124/83 120/75  Pulse: 86 100 99 92  Resp:  Temp:  98.7 F (37.1 C) 98.6 F (37 C) 97.7 F (36.5 C)  TempSrc:  Oral Oral Oral  SpO2:  97% 93% 97%  Weight:   43.6 kg   Height:        Intake/Output Summary (Last 24 hours) at 04/21/2019 1602 Last data filed at 04/21/2019 1456 Gross per 24 hour  Intake 510.07 ml  Output 100 ml  Net 410.07 ml   Filed Weights   04/18/19 0546 04/19/19 0530 04/21/19 0613  Weight: 40.9 kg 42.5 kg 43.6 kg   Exam  Awake alert pleasant no ict no pallor bitemporal wasting cta b no added sound abd tender non distended No neurological deficit No MSK     Data Reviewed: I have personally reviewed following labs and imaging studies  CBC: Recent Labs  Lab 04/15/19 0235  04/18/19 0539 04/18/19 1136 04/19/19  0519 04/20/19 0919 04/21/19 0504  WBC 9.6  --  11.9*  --  10.0 12.3* 13.9*  NEUTROABS  --   --   --   --   --  10.4* 11.9*  HGB 8.2*   < > 7.5* 7.7* 7.3* 7.7* 8.3*  HCT 25.7*   < > 24.6* 25.5* 23.4* 24.9* 27.9*  MCV 110.3*  --  111.8*  --  110.4* 108.7* 108.1*  PLT 414*  --  392  --  424* 507* 657*   < > = values in this interval not displayed.   Basic Metabolic Panel: Recent Labs  Lab 04/16/19 0330 04/17/19 0518 04/18/19 0539 04/19/19 0519 04/20/19 0919 04/21/19 0504  NA 134* 135 136 134* 136 135  K 4.1 3.7 3.5 3.5 3.1* 3.4*  CL 96* 97* 96* 97* 103 101  CO2 32 GLUCOSE 170* 142* 132* 187* 155* 120*  BUN CREATININE 0.37* 0.34* 0.30* <0.30* <0.30* <0.30*  CALCIUM 8.5* 8.5* 8.3* 8.2* 8.4* 8.4*  MG 1.9  --   --   --  1.7  --    GFR: CrCl cannot be calculated (This lab value cannot be used to calculate CrCl because it is not a number: <0.30). Liver Function Tests: Recent Labs  Lab 04/19/19 0519 04/20/19 0919 04/21/19 0504  AST ALT ALKPHOS 99 136* 136*  BILITOT 0.3 0.4 1.0  PROT 5.1* 5.2* 5.7*  ALBUMIN 2.0* 2.0* 2.2*   Recent Labs  Lab 04/21/19 0504  LIPASE 203*   No results for input(s): AMMONIA in the last 168 hours. Coagulation Profile: No results for input(s): INR, PROTIME in the last 168  hours. Cardiac Enzymes: No results for input(s): CKTOTAL, CKMB, CKMBINDEX, TROPONINI in the last 168 hours. BNP (last 3 results) No results for input(s): PROBNP in the last 8760 hours. HbA1C: No results for input(s): HGBA1C in the last 72 hours. CBG: Recent Labs  Lab 04/20/19 1144 04/20/19 1643 04/20/19 2126 04/21/19 0732 04/21/19 1146  GLUCAP 138* 106* 101* 130* 143*   Lipid Profile: No results for input(s): CHOL, HDL, LDLCALC, TRIG, CHOLHDL, LDLDIRECT in the last 72 hours. Thyroid Function Tests: No results for input(s): TSH, T4TOTAL, FREET4, T3FREE, THYROIDAB in the last 72 hours. Anemia Panel: No  results for input(s): VITAMINB12, FOLATE, FERRITIN, TIBC, IRON, RETICCTPCT in the last 72 hours. Urine analysis:    Component Value Date/Time   COLORURINE AMBER (A) 04/17/2019 1409   APPEARANCEUR CLOUDY (A) 04/17/2019 1409   LABSPEC 1.010 04/17/2019 1409   PHURINE 7.0 04/17/2019 1409   GLUCOSEU NEGATIVE 04/17/2019 1409   HGBUR SMALL (A) 04/17/2019 1409   BILIRUBINUR NEGATIVE 04/17/2019 1409   KETONESUR NEGATIVE 04/17/2019 1409   PROTEINUR NEGATIVE 04/17/2019 1409   NITRITE NEGATIVE 04/17/2019 1409   LEUKOCYTESUR SMALL (A) 04/17/2019 1409   Sepsis Labs: @LABRCNTIP (procalcitonin:4,lacticidven:4)  ) Recent Results (from the past 240 hour(s))  Culture, Urine     Status: Abnormal   Collection Time: 04/17/19  2:09 PM  Result Value Ref Range Status   Specimen Description   Final    URINE, CLEAN CATCH Performed at [facility], [address] [place], [place] [address]    Special Requests   Final    NONE Performed at [facility], [address], [place], [place] [address]    Culture MULTIPLE SPECIES PRESENT, SUGGEST RECOLLECTION (A)  Final   Report Status 04/19/2019 FINAL  Final  Culture, blood (routine x 2)     Status: Abnormal   Collection Time: 04/17/19  2:48 PM  Result Value Ref Range Status   Specimen Description   Final    RIGHT ANTECUBITAL Performed at [facility], [address] [place], [place] [address]    Special Requests   Final    BOTTLES DRAWN AEROBIC AND ANAEROBIC Blood Culture adequate volume Performed at [facility], [address], [place], [place] [address]    Culture  Setup Time   Final    GRAM NEGATIVE RODS IN BOTH AEROBIC AND ANAEROBIC BOTTLES CRITICAL RESULT CALLED TO, READ BACK BY AND VERIFIED WITH: M. RENZ PHARMD, AT 6767 04/18/19 BY D. VANHOOK    Culture (A)  Final    PROTEUS MIRABILIS SUSCEPTIBILITIES PERFORMED ON PREVIOUS CULTURE WITHIN THE LAST 5 DAYS. Performed at  [facility], [address], [place], [place] [address]    Report Status 04/20/2019 FINAL  Final  Blood Culture ID Panel (Reflexed)     Status: Abnormal   Collection Time: 04/17/19  2:53 PM  Result Value Ref Range Status   Enterococcus species NOT DETECTED NOT DETECTED Final   Listeria monocytogenes NOT DETECTED NOT DETECTED Final   Staphylococcus species NOT DETECTED NOT DETECTED Final   Staphylococcus aureus (BCID) NOT DETECTED NOT DETECTED Final   Streptococcus species NOT DETECTED NOT DETECTED Final   Streptococcus agalactiae NOT DETECTED NOT DETECTED Final   Streptococcus pneumoniae NOT DETECTED NOT DETECTED Final   Streptococcus pyogenes NOT DETECTED NOT DETECTED Final   Acinetobacter baumannii NOT DETECTED NOT DETECTED Final   Enterobacteriaceae species DETECTED (A) NOT DETECTED Final    Comment: Enterobacteriaceae represent a large family of  gram-negative bacteria, not a single organism. CRITICAL RESULT CALLED TO, READ BACK BY AND VERIFIED WITH: Becky Sax PHARMD, AT 1040 04/18/19 BY D. VANHOOK    Enterobacter cloacae complex NOT DETECTED NOT DETECTED Final   Escherichia coli NOT DETECTED NOT DETECTED Final   Klebsiella oxytoca NOT DETECTED NOT DETECTED Final   Klebsiella pneumoniae NOT DETECTED NOT DETECTED Final   Proteus species DETECTED (A) NOT DETECTED Final    Comment: CRITICAL RESULT CALLED TO, READ BACK BY AND VERIFIED WITH: Becky Sax PHARMD, AT 1040 04/18/19 BY D. VANHOOK    Serratia marcescens NOT DETECTED NOT DETECTED Final   Carbapenem resistance NOT DETECTED NOT DETECTED Final   Haemophilus influenzae NOT DETECTED NOT DETECTED Final   Neisseria meningitidis NOT DETECTED NOT DETECTED Final   Pseudomonas aeruginosa NOT DETECTED NOT DETECTED Final   Candida albicans NOT DETECTED NOT DETECTED Final   Candida glabrata NOT DETECTED NOT DETECTED Final   Candida krusei NOT DETECTED NOT DETECTED Final   Candida parapsilosis NOT DETECTED NOT DETECTED Final   Candida  tropicalis NOT DETECTED NOT DETECTED Final    Comment: Performed at [facility], [address] [place], [place] [address]  Culture, blood (routine x 2)     Status: Abnormal   Collection Time: 04/17/19  2:54 PM  Result Value Ref Range Status   Specimen Description   Final    BLOOD RIGHT HAND Performed at [facility], [address] [place], [place] [address]    Special Requests   Final    BOTTLES DRAWN AEROBIC AND ANAEROBIC Blood Culture adequate volume Performed at [facility], [address], [place], [place] [address]    Culture  Setup Time   Final    GRAM NEGATIVE RODS IN BOTH AEROBIC AND ANAEROBIC BOTTLES CRITICAL VALUE NOTED.  VALUE IS CONSISTENT WITH PREVIOUSLY REPORTED AND CALLED VALUE.    Culture PROTEUS MIRABILIS (A)  Final   Report Status 04/20/2019 FINAL  Final   Organism ID, Bacteria PROTEUS MIRABILIS  Final      Susceptibility   Proteus mirabilis - MIC*    AMPICILLIN <=2 SENSITIVE Sensitive     CEFAZOLIN <=4 SENSITIVE Sensitive     CEFEPIME <=1 SENSITIVE Sensitive     CEFTAZIDIME <=1 SENSITIVE Sensitive     CEFTRIAXONE <=1 SENSITIVE Sensitive     CIPROFLOXACIN <=0.25 SENSITIVE Sensitive     GENTAMICIN <=1 SENSITIVE Sensitive     IMIPENEM 2 SENSITIVE Sensitive     TRIMETH/SULFA <=20 SENSITIVE Sensitive     AMPICILLIN/SULBACTAM <=2 SENSITIVE Sensitive     PIP/TAZO <=4 SENSITIVE Sensitive     * PROTEUS MIRABILIS  Culture, blood (single)     Status: None (Preliminary result)   Collection Time: 04/19/19  7:18 PM  Result Value Ref Range Status   Specimen Description   Final    BLOOD LEFT HAND Performed at [facility], [address] [place], [place] [address]    Special Requests   Final    BOTTLES DRAWN AEROBIC AND ANAEROBIC Blood Culture adequate volume Performed at [facility], [address] [place], [place] [address]    Culture   Final    NO GROWTH 2 DAYS Performed at  [facility], [address], [place], [place] [address]    Report Status PENDING  Incomplete      Radiology Studies: Dg Chest 2 View  Result Date: 04/20/2019 CLINICAL DATA:  Shortness of breath EXAM: CHEST -  2 VIEW COMPARISON:  04/17/2019 FINDINGS: Cardiac silhouette is unchanged. Emphysematous changes are noted bilaterally. There is a small left-sided pleural effusion. There is a persistent retrocardiac opacity favored to represent compressive atelectasis. Pleuroparenchymal scarring is noted at the lung apices. There is osteopenia. IMPRESSION: 1. Persistent small left-sided pleural effusion, slightly increased from prior study. 2. Slight interval worsening of a retrocardiac opacity which is favored to represent atelectasis. 3. Emphysematous changes. Electronically Signed   By: Katherine Mantlehristopher  Green M.D.   On: 04/20/2019 16:43   Ct Abdomen Pelvis W Contrast  Result Date: 04/20/2019 CLINICAL DATA:  Acute pancreatitis, abdominal pain, fever, abscess suspected EXAM: CT ABDOMEN AND PELVIS WITH CONTRAST TECHNIQUE: Multidetector CT imaging of the abdomen and pelvis was performed using the standard protocol following bolus administration of intravenous contrast. CONTRAST:  80mL OMNIPAQUE IOHEXOL 300 MG/ML  SOLN COMPARISON:  CT abdomen pelvis, 03/24/2019 FINDINGS: Lower chest: There are new small, left greater than right bilateral pleural effusions with associated atelectasis or consolidation. Coronary artery calcifications. Hepatobiliary: Hepatomegaly, maximum coronal span 22 cm. No focal liver abnormality is seen. Status post cholecystectomy. No biliary dilatation. Pancreas: Redemonstrated inflammatory stranding and fluid about the body and tail of the pancreas. There has been interval enlargement of a fluid collection abutting the tail of the pancreas and lesser curvature of stomach, measuring approximately 4.3 x 2.1 x 4.6 cm (series 2, image 26, series 5, image 32). Pancreatic parenchymal calcifications.  Spleen: Normal in size without focal abnormality. Adrenals/Urinary Tract: Adrenal glands are unremarkable. Kidneys are normal, without renal calculi, focal lesion, or hydronephrosis. Distended urinary bladder. Stomach/Bowel: Stomach is within normal limits. No evidence of bowel wall thickening, distention, or inflammatory changes. Sigmoid diverticulosis. Vascular/Lymphatic: Severe mixed calcific atherosclerosis. No enlarged abdominal or pelvic lymph nodes. Reproductive: Calcified uterine fibroids. Other: No abdominal wall hernia or abnormality. Small volume 4 quadrant ascites. Musculoskeletal: No acute or significant osseous findings. IMPRESSION: 1. Redemonstrated inflammatory stranding and fluid about the body and tail of the pancreas. There has been interval enlargement of a fluid collection abutting the tail of the pancreas and lesser curvature of stomach, measuring approximately 4.3 x 2.1 x 4.6 cm (series 2, image 26, series 5, image 32). Findings concerning for abscess or other acute pancreatic fluid collection complicating acute pancreatitis. 2.  Small volume ascites and pleural effusions. 3.  Distended urinary bladder.  Correlate for urinary retention. 4.  Other chronic and incidental findings as detailed above. Electronically Signed   By: Lauralyn PrimesAlex  Bibbey M.D.   On: 04/20/2019 16:43   Scheduled Meds: . amitriptyline  25 mg Oral QHS  . apixaban  5 mg Oral BID  . feeding supplement  1 Container Oral BID BM  . feeding supplement (ENSURE ENLIVE)  237 mL Oral TID BM  . feeding supplement (PRO-STAT SUGAR FREE 64)  30 mL Oral Daily  . folic acid  1 mg Oral Daily  . furosemide  20 mg Oral Daily  . insulin aspart  0-9 Units Subcutaneous TID WC  . mouth rinse  15 mL Mouth Rinse BID  . metoprolol tartrate  37.5 mg Oral BID  . nicotine  21 mg Transdermal Daily  . polyethylene glycol  17 g Oral Daily  . potassium chloride  40 mEq Oral Daily  . saccharomyces boulardii  250 mg Oral BID  . thiamine  100 mg Oral  Daily   Continuous Infusions: . aztreonam 2 g (04/21/19 0845)     LOS: 16 days   Time Spent in minutes  30  Pleas Koch, MD [facility] 4:02 PM

## 2019-04-21 NOTE — Consult Note (Addendum)
[facility] Surgery Consult Note  April [name] [birth year]  [id number].    Requesting MD: Nita Sells   Chief Complaint :  Hip pain/UTI Reason for Consult: Enlarging pancreatic pseudocyst  HPI: Patient is a complicated 73 year old female who was hospitalized 4/7 - 03/24/2019, after falling and having a displaced intertrochanteric fracture of the left femur.  Underwent intramedullary nail placement by Dr. Jean Rosenthal on 03/22/19.  Additional problems included history of alcohol abuse, poor nutrition, prolonged QTC, hypocalcemia, hypokalemia, tobacco use, hypertension, hyperlipidemia, Hx of bipolar syndrome, and anxiety.    She returned to the ED on 03/18/2019 with complaints of hip pain and UTI.  She also complained of pain in her right side which was becoming progressively worse.  Her daughter reported that in addition to the abdominal pain on the right side she was having fever, mild confusion, and reported severe anorexia.  Patient reported she was not drinking, but the daughter thought she was.  Work-up at that time included CT of the abdomen pelvis which showed acute pancreatitis involving the body and the tail of the pancreas.  There is a 2.1 cm hypodense fluid collection adjacent to the pancreatic tail and posterior fundus of the stomach.  She was also found to have a nonocclusive splenic vein/portal vein thrombus at the confluence of the splenic vein, SMV and portal vein.  This was thought to be secondary to pancreatitis and inflammation in the area.  Labs showed a lactate of 2.9.,  A lipase of 82, total bilirubin 0.5, ALT 37, AST 51, alk phos 215.  Electrolytes were mostly abnormal.  WBC was 19.5, hemoglobin 9.3, hematocrit 28.7, platelets were 329,000.  Patient was admitted and placed on IV heparin and IV antibiotics the diagnosis of sepsis due to UTI, acute pancreatitis complicated by pseudocyst and portal vein thrombosis.  Additional problems included metabolic  encephalopathy, hypokalemia, hyponatremia, alcohol abuse with potential for withdrawal, she was seen by GI/ Dr. Ronald Lobo who agreed with current plan of care.  Her hospitalization she was found to have acute systolic heart failure with stress-induced cardiomyopathy complicated by acute cardiogenic pulmonary edema and acute hypoxic respiratory failure.  She also developed Proteus mirabilis and Enterobacter bacteremia.    Yesterday she underwent a chest x-ray for shortness of breath which showed persistent small left-sided pleural effusion probable worsening atelectasis and emphysematous changes.  A CT scan was also repeated yesterday.  Which confirmed left greater than right bilateral pleural effusions, hepatomegaly maximal gruel span was 22 cm.  She is status post cholecystectomy.  There was no biliary dilatation.  The pancreas redemonstrated inflammatory stranding and fluid around the body and tail the pancreas.  There is interval enlargement of the fluid collection abutting the tail the pancreas and the lesser curvature measuring 4.3 x 2.1 x 4.6 cm also some pancreatic parenchymal calcifications.  Radiology read this as concerning for abscess or other acute pancreatic fluid collections complicating acute pancreatitis and general surgery was asked to see and evaluate.  Currently the patient is on a soft diet.  She is afebrile and vital signs are stable.  Labs this a.m. showed a potassium of 3.4, glucose 120, creatinine less than 0.30 alk phos of 136, AST 24, ALT 21, total bilirubin 1.0.  WBC 13.9, hemoglobin 8.3, hematocrit 27.9, platelets 657,000. She is on Eliquis 5 mg twice daily for anticoagulation last dose was  yesterday p.m. she is on aztreoman started 04/18/19 >> day 4. Lipase this AM is 203.  Patient reports she is not  eating very much but she is taking some of the supplements.  She had a soft diet delivered while I was there which included eggs, bacon and an English muffin.  He said she would  eat some of this later.  She points to pain in her lower abdomen near the midline, but is currently diffusely tender. WBC is rising over the past 48 hours 10.0>>12.3>>13.9(04/21/19)   ROS: Review of Systems  Constitutional: Positive for fever (Her last fever was on 04/17/2019) and weight loss (She is a frail cachectic woman, who cannot remember much about her weight loss but thinks she was losing weight prior to admission for her full 03/21/2019).       Patient does not have much memory or recollection of her illness stating from the time she fell to the present.  HENT: Negative.        She is having trouble with dysphagia she is on a soft diet with instructions at the head of the bed.  She does not seem to be aware of any of this and denied any trouble swallowing.  Eyes: Negative.   Respiratory: Negative.   Cardiovascular: Positive for palpitations (She reports palpitations with anxiety.) and leg swelling. Negative for chest pain, orthopnea, claudication and PND.  Gastrointestinal: Positive for abdominal pain (He reported some abdominal pain and pointed to her lower abdomen, but she was generally tender all over.) and diarrhea (She reports having loose stools). Negative for blood in stool, constipation, heartburn, melena, nausea and vomiting.  Genitourinary:       She denies any symptoms of dysuria.  She does have a wick in place.  Musculoskeletal:       She reports she aches all over from her toes up to her back.  Skin: Negative for itching and rash.       She has been in bed since 03/21/2019 after her fall.  She has multiple is of padding and skin protection.  Neurological: Positive for weakness.       She does not have much memory of her illness, reason why she came back to the hospital on 04/09/2019.  Endo/Heme/Allergies: Bruises/bleeds easily (She is on Eliquis, and was on IV heparin after admission 03/19/2019.).  Psychiatric/Behavioral: Positive for substance abuse. The patient is  nervous/anxious.        Hx bipolar disorder    Family History  Problem Relation Age of Onset  . Heart disease Father   . Neuropathy Neg Hx     Past Medical History:  Diagnosis Date  . Anxiety   . Current smoker   . Depression   . ETOH abuse   . GERD (gastroesophageal reflux disease)   . History of shingles summer 2015  . HTN (hypertension)   . Hyperlipidemia   . Hypertension   . Leg edema   . Neuropathy   . Neuropathy   . Pancreatitis     Past Surgical History:  Procedure Laterality Date  . ERCP N/A 02/01/2017   Procedure: ENDOSCOPIC RETROGRADE CHOLANGIOPANCREATOGRAPHY (ERCP);  Surgeon: Teena Irani, MD;  Location: [facility];  Service: Endoscopy;  Laterality: N/A;  . INTRAMEDULLARY (IM) NAIL INTERTROCHANTERIC Left 03/22/2019   Procedure: INTRAMEDULLARY (IM) NAIL INTERTROCHANTRIC;  Surgeon: Mcarthur Rossetti, MD;  Location: [facility];  Service: Orthopedics;  Laterality: Left;  . LAPAROSCOPIC CHOLECYSTECTOMY WITH COMMON DUCT EXPLORATION AND INTRAOP CHOLANGIOGRAM N/A 02/03/2017   Procedure: LAPAROSCOPIC CHOLECYSTECTOMY WITH POSSIBLE COMMON DUCT EXPLORATION AND INTRAOP CHOLANGIOGRAM;  Surgeon: Alphonsa Overall, MD;  Location: [facility];  Service:  General;  Laterality: N/A;  . removal of uterine fibroids  2004    Social History:  reports that she has been smoking cigarettes. She has been smoking about 1.00 pack per day. She has never used smokeless tobacco. She reports current alcohol use of about 14.0 standard drinks of alcohol per week. She reports that she does not use drugs.  Allergies:  Allergies  Allergen Reactions  . Naltrexone Shortness Of Breath  . Rocephin [Ceftriaxone Sodium In Dextrose] Anaphylaxis  . Penicillins Rash    Has patient had a PCN reaction causing immediate rash, facial/tongue/throat swelling, SOB or lightheadedness with hypotension: Yes immediate rash  Has patient had a PCN reaction causing severe rash involving mucus membranes or skin necrosis: Yes Has  patient had a PCN reaction that required hospitalization": No Has patient had a PCN reaction occurring within the last 10 years: Yes If all of the above answers are "NO", then may proceed with Cephalosporin use.     Medications Prior to Admission  Medication Sig Dispense Refill  . amitriptyline (ELAVIL) 25 MG tablet Take 25 mg by mouth at bedtime as needed for sleep.     Marland Kitchen aspirin EC 325 MG EC tablet Take 1 tablet (325 mg total) by mouth daily with breakfast. 30 tablet 0  . calcium-vitamin D (OSCAL WITH D) 500-200 MG-UNIT tablet Take 1 tablet by mouth 2 (two) times daily. 60 tablet 2  . clonazePAM (KLONOPIN) 0.5 MG tablet Take 0.5 mg by mouth 2 (two) times daily as needed for anxiety. as directed  0  . folic acid (FOLVITE) 1 MG tablet Take 1 tablet (1 mg total) by mouth daily. 30 tablet 2  . magnesium oxide (MAG-OX) 400 (241.3 Mg) MG tablet Take 1 tablet (400 mg total) by mouth 2 (two) times daily. 60 tablet 0  . methocarbamol (ROBAXIN) 500 MG tablet Take 1 tablet (500 mg total) by mouth every 6 (six) hours as needed for muscle spasms. 30 tablet 0  . ondansetron (ZOFRAN) 4 MG tablet Take 1 tablet (4 mg total) by mouth every 6 (six) hours as needed for nausea. 20 tablet 0  . oxyCODONE-acetaminophen (PERCOCET/ROXICET) 5-325 MG tablet Take 1 tablet by mouth every 6 (six) hours as needed for severe pain. 30 tablet 0  . pantoprazole (PROTONIX) 40 MG tablet Take 1 tablet (40 mg total) by mouth daily for 15 days. 15 tablet 0  . promethazine (PHENERGAN) 25 MG tablet Take 25 mg by mouth every 6 (six) hours as needed. for nausea    . thiamine 100 MG tablet Take 1 tablet (100 mg total) by mouth daily. 30 tablet 0  . feeding supplement, ENSURE ENLIVE, (ENSURE ENLIVE) LIQD Take 237 mLs by mouth 2 (two) times daily between meals. (Patient not taking: Reported on 03/21/2019) 237 mL 12  . Multiple Vitamin (MULTIVITAMIN WITH MINERALS) TABS tablet Take 1 tablet by mouth daily. (Patient not taking: Reported on  03/21/2019) 30 tablet 0  . polyethylene glycol (MIRALAX / GLYCOLAX) packet Take 17 g by mouth daily. (Patient not taking: Reported on 03/21/2019)  0    Blood pressure 124/83, pulse 99, temperature 98.6 F (37 C), temperature source Oral, resp. rate 20, height '5\' 6"'  (1.676 m), weight 43.6 kg, SpO2 93 %. Physical Exam: Physical Exam Constitutional:      General: She is not in acute distress.    Appearance: She is not ill-appearing, toxic-appearing or diaphoretic.  HENT:     Head: Normocephalic.     Nose: Nose normal. No  congestion.     Mouth/Throat:     Mouth: Mucous membranes are moist.     Pharynx: Oropharynx is clear. No oropharyngeal exudate.  Eyes:     General: No scleral icterus.       Right eye: No discharge.        Left eye: No discharge.     Comments: Pupils are equal  Neck:     Musculoskeletal: Normal range of motion and neck supple. No neck rigidity or muscular tenderness.     Vascular: No carotid bruit.  Cardiovascular:     Rate and Rhythm: Normal rate and regular rhythm.     Pulses: Normal pulses.     Heart sounds: Normal heart sounds. No murmur.  Pulmonary:     Effort: Pulmonary effort is normal. No respiratory distress.     Breath sounds: Normal breath sounds. No stridor. No wheezing, rhonchi or rales.  Chest:     Chest wall: No tenderness.  Abdominal:     General: Abdomen is flat. There is no distension.     Palpations: Abdomen is soft.     Comments: She points to pain just above her vagina when you ask her where she hurts.  On exam she is, diffusely tender all over.  Bowel sounds are present but somewhat hypoactive.  Genitourinary:    Comments: She has a urinary wick in place. Musculoskeletal:        General: No swelling, tenderness, deformity or signs of injury.     Right lower leg: No edema.     Left lower leg: No edema.  Lymphadenopathy:     Cervical: No cervical adenopathy.  Skin:    General: Skin is warm and dry.     Coloration: Skin is not jaundiced or  pale.     Findings: Bruising present. No erythema or rash.     Comments: He has multiple padding; on her back both feet and heels.  For skin protection.  Neurological:     General: No focal deficit present.     Mental Status: She is alert.     Comments: She appears oriented but she has no memory of her illness.  She is not really aware of what is occurring on a daily basis.  Psychiatric:     Comments: She has some generalized confusion, not very clear on what is happening, or why she is here.     Results for orders placed or performed during the hospital encounter of 04/10/2019 (from the past 48 hour(s))  Glucose, capillary     Status: Abnormal   Collection Time: 04/19/19  7:40 AM  Result Value Ref Range   Glucose-Capillary 150 (H) 70 - 99 mg/dL  Glucose, capillary     Status: Abnormal   Collection Time: 04/19/19 12:09 PM  Result Value Ref Range   Glucose-Capillary 287 (H) 70 - 99 mg/dL  Glucose, capillary     Status: Abnormal   Collection Time: 04/19/19  4:36 PM  Result Value Ref Range   Glucose-Capillary 158 (H) 70 - 99 mg/dL  Culture, blood (single)     Status: None (Preliminary result)   Collection Time: 04/19/19  7:18 PM  Result Value Ref Range   Specimen Description      BLOOD LEFT HAND Performed at [facility] [address] [place], [place] [address]    Special Requests      BOTTLES DRAWN AEROBIC AND ANAEROBIC Blood Culture adequate volume Performed at [facility]  [address] [place], [place] [address]    Culture      NO GROWTH 2 DAYS Performed at [facility], [place] [address] [place], [place] [address]    Report Status PENDING   Glucose, capillary     Status: Abnormal   Collection Time: 04/19/19  9:45 PM  Result Value Ref Range   Glucose-Capillary 228 (H) 70 - 99 mg/dL  Glucose, capillary     Status: Abnormal   Collection Time: 04/20/19  7:37 AM  Result Value Ref Range   Glucose-Capillary 188 (H) 70 - 99 mg/dL   CBC with Differential/Platelet     Status: Abnormal   Collection Time: 04/20/19  9:19 AM  Result Value Ref Range   WBC 12.3 (H) 4.0 - 10.5 K/uL   RBC 2.29 (L) 3.87 - 5.11 MIL/uL   Hemoglobin 7.7 (L) 12.0 - 15.0 g/dL   HCT 24.9 (L) 36.0 - 46.0 %   MCV 108.7 (H) 80.0 - 100.0 fL   MCH 33.6 26.0 - 34.0 pg   MCHC 30.9 30.0 - 36.0 g/dL   RDW 15.1 11.5 - 15.5 %   Platelets 507 (H) 150 - 400 K/uL   nRBC 0.0 0.0 - 0.2 %   Neutrophils Relative % 85 %   Neutro Abs 10.4 (H) 1.7 - 7.7 K/uL   Lymphocytes Relative 7 %   Lymphs Abs 0.9 0.7 - 4.0 K/uL   Monocytes Relative 7 %   Monocytes Absolute 0.8 0.1 - 1.0 K/uL   Eosinophils Relative 0 %   Eosinophils Absolute 0.0 0.0 - 0.5 K/uL   Basophils Relative 0 %   Basophils Absolute 0.1 0.0 - 0.1 K/uL   Immature Granulocytes 1 %   Abs Immature Granulocytes 0.08 (H) 0.00 - 0.07 K/uL    Comment: Performed at [facility], [place] [address] [place], [place] [address]  Comprehensive metabolic panel     Status: Abnormal   Collection Time: 04/20/19  9:19 AM  Result Value Ref Range   Sodium 136 135 - 145 mmol/L   Potassium 3.1 (L) 3.5 - 5.1 mmol/L   Chloride 103 98 - 111 mmol/L   CO2 26 22 - 32 mmol/L   Glucose, Bld 155 (H) 70 - 99 mg/dL   BUN 14 8 - 23 mg/dL   Creatinine, Ser <0.30 (L) 0.44 - 1.00 mg/dL   Calcium 8.4 (L) 8.9 - 10.3 mg/dL   Total Protein 5.2 (L) 6.5 - 8.1 g/dL   Albumin 2.0 (L) 3.5 - 5.0 g/dL   AST 29 15 - 41 U/L   ALT 22 0 - 44 U/L   Alkaline Phosphatase 136 (H) 38 - 126 U/L   Total Bilirubin 0.4 0.3 - 1.2 mg/dL   GFR calc non Af Amer NOT CALCULATED >60 mL/min   GFR calc Af Amer NOT CALCULATED >60 mL/min   Anion gap 7 5 - 15    Comment: Performed at [facility], [place] [address], [place], [place] [address]  Magnesium     Status: None   Collection Time: 04/20/19  9:19 AM  Result Value Ref Range   Magnesium 1.7 1.7 - 2.4 mg/dL    Comment: Performed at [facility], [place]  [address] [place], [place] [address]  Glucose, capillary     Status: Abnormal   Collection Time: 04/20/19 11:44 AM  Result Value Ref Range   Glucose-Capillary 138 (H) 70 - 99 mg/dL  Glucose, capillary     Status: Abnormal  Collection Time: 04/20/19  4:43 PM  Result Value Ref Range   Glucose-Capillary 106 (H) 70 - 99 mg/dL  Glucose, capillary     Status: Abnormal   Collection Time: 04/20/19  9:26 PM  Result Value Ref Range   Glucose-Capillary 101 (H) 70 - 99 mg/dL   Comment 1 Notify RN   CBC with Differential/Platelet     Status: Abnormal   Collection Time: 04/21/19  5:04 AM  Result Value Ref Range   WBC 13.9 (H) 4.0 - 10.5 K/uL   RBC 2.58 (L) 3.87 - 5.11 MIL/uL   Hemoglobin 8.3 (L) 12.0 - 15.0 g/dL   HCT 27.9 (L) 36.0 - 46.0 %   MCV 108.1 (H) 80.0 - 100.0 fL   MCH 32.2 26.0 - 34.0 pg   MCHC 29.7 (L) 30.0 - 36.0 g/dL   RDW 15.3 11.5 - 15.5 %   Platelets 657 (H) 150 - 400 K/uL   nRBC 0.0 0.0 - 0.2 %   Neutrophils Relative % 86 %   Neutro Abs 11.9 (H) 1.7 - 7.7 K/uL   Lymphocytes Relative 7 %   Lymphs Abs 1.0 0.7 - 4.0 K/uL   Monocytes Relative 6 %   Monocytes Absolute 0.9 0.1 - 1.0 K/uL   Eosinophils Relative 0 %   Eosinophils Absolute 0.1 0.0 - 0.5 K/uL   Basophils Relative 0 %   Basophils Absolute 0.1 0.0 - 0.1 K/uL   Immature Granulocytes 1 %   Abs Immature Granulocytes 0.10 (H) 0.00 - 0.07 K/uL    Comment: Performed at [facility], [place] [address], [place], [place] [address]  Comprehensive metabolic panel     Status: Abnormal   Collection Time: 04/21/19  5:04 AM  Result Value Ref Range   Sodium 135 135 - 145 mmol/L   Potassium 3.4 (L) 3.5 - 5.1 mmol/L   Chloride 101 98 - 111 mmol/L   CO2 24 22 - 32 mmol/L   Glucose, Bld 120 (H) 70 - 99 mg/dL   BUN 11 8 - 23 mg/dL   Creatinine, Ser <0.30 (L) 0.44 - 1.00 mg/dL   Calcium 8.4 (L) 8.9 - 10.3 mg/dL   Total Protein 5.7 (L) 6.5 - 8.1 g/dL   Albumin 2.2 (L) 3.5 - 5.0 g/dL   AST 24 15 - 41 U/L   ALT 21  0 - 44 U/L   Alkaline Phosphatase 136 (H) 38 - 126 U/L   Total Bilirubin 1.0 0.3 - 1.2 mg/dL   GFR calc non Af Amer NOT CALCULATED >60 mL/min   GFR calc Af Amer NOT CALCULATED >60 mL/min   Anion gap 10 5 - 15    Comment: Performed at [facility], [place] [address], [place], [place] [address]   Dg Chest 2 View  Result Date: 04/20/2019 CLINICAL DATA:  Shortness of breath EXAM: CHEST - 2 VIEW COMPARISON:  04/17/2019 FINDINGS: Cardiac silhouette is unchanged. Emphysematous changes are noted bilaterally. There is a small left-sided pleural effusion. There is a persistent retrocardiac opacity favored to represent compressive atelectasis. Pleuroparenchymal scarring is noted at the lung apices. There is osteopenia. IMPRESSION: 1. Persistent small left-sided pleural effusion, slightly increased from prior study. 2. Slight interval worsening of a retrocardiac opacity which is favored to represent atelectasis. 3. Emphysematous changes. Electronically Signed   By: Constance Holster M.D.   On: 04/20/2019 16:43   Ct Abdomen Pelvis W Contrast  Result Date: 04/20/2019 CLINICAL DATA:  Acute pancreatitis, abdominal pain, fever, abscess suspected  EXAM: CT ABDOMEN AND PELVIS WITH CONTRAST TECHNIQUE: Multidetector CT imaging of the abdomen and pelvis was performed using the standard protocol following bolus administration of intravenous contrast. CONTRAST:  48m OMNIPAQUE IOHEXOL 300 MG/ML  SOLN COMPARISON:  CT abdomen pelvis, 03/24/2019 FINDINGS: Lower chest: There are new small, left greater than right bilateral pleural effusions with associated atelectasis or consolidation. Coronary artery calcifications. Hepatobiliary: Hepatomegaly, maximum coronal span 22 cm. No focal liver abnormality is seen. Status post cholecystectomy. No biliary dilatation. Pancreas: Redemonstrated inflammatory stranding and fluid about the body and tail of the pancreas. There has been interval enlargement of a fluid collection  abutting the tail of the pancreas and lesser curvature of stomach, measuring approximately 4.3 x 2.1 x 4.6 cm (series 2, image 26, series 5, image 32). Pancreatic parenchymal calcifications. Spleen: Normal in size without focal abnormality. Adrenals/Urinary Tract: Adrenal glands are unremarkable. Kidneys are normal, without renal calculi, focal lesion, or hydronephrosis. Distended urinary bladder. Stomach/Bowel: Stomach is within normal limits. No evidence of bowel wall thickening, distention, or inflammatory changes. Sigmoid diverticulosis. Vascular/Lymphatic: Severe mixed calcific atherosclerosis. No enlarged abdominal or pelvic lymph nodes. Reproductive: Calcified uterine fibroids. Other: No abdominal wall hernia or abnormality. Small volume 4 quadrant ascites. Musculoskeletal: No acute or significant osseous findings. IMPRESSION: 1. Redemonstrated inflammatory stranding and fluid about the body and tail of the pancreas. There has been interval enlargement of a fluid collection abutting the tail of the pancreas and lesser curvature of stomach, measuring approximately 4.3 x 2.1 x 4.6 cm (series 2, image 26, series 5, image 32). Findings concerning for abscess or other acute pancreatic fluid collection complicating acute pancreatitis. 2.  Small volume ascites and pleural effusions. 3.  Distended urinary bladder.  Correlate for urinary retention. 4.  Other chronic and incidental findings as detailed above. Electronically Signed   By: AEddie CandleM.D.   On: 04/20/2019 16:43      Assessment/Plan UTI sepsis Proteus mirabilis/Enterobacter bacteremia Hx fall with left femur fracture 03/21/2019 Severe calorie protein malnutrition/dysphagia/deconditioning Hx of bipolar disorder Metabolic encephalopathy Acute systolic heart failure-stress-induced cardiomyopathy Pulmonary edema/acute hypoxic/hypercapnic respiratory failure -improved Hypocalcemia/hypomagnesemia/hypokalemia Aztreomanb  Acute pancreatitis  complicated by enlarging pseudocyst/hypodense fluid collection tail of pancres Portal vein thrombosis Hx alcohol abuse Hx tobacco abuse  FEN: Soft diet IAT:[id number] >> day 4 DVT:  Eliquis Follow up to be determined   Plan: Patient's lipase is up more today.  WBC is rising but she does not appear septic or acutely ill.  We do not normally feed pancreatitis more than clear liquids.  Dr. BNinfa Lindenand Dr. BBarry Dieneswill review the CT and make final recommendations later today.   JEarnstine RegalPChristus Dubuis Hospital Of [facility]ontSurgery 04/21/2019, 7:24 AM Pager: [phone]

## 2019-04-21 NOTE — Progress Notes (Signed)
Daily Progress Note   Patient Name: April PoissonDeborah S [name]       Date: 04/21/2019 DOB: 04/21/1946  Age: 73 y.o. MRN#: [id number] Attending Physician: Rhetta MuraSamtani, Jai-Gurmukh, MD Primary Care Physician: April [name], April W., PA-C Admit Date: 04/06/2019  Reason for Consultation/Follow-up: Establishing goals of care  Subjective: Per RN, patient awake, alert, oriented. Sat in chair x2 today. Poor appetite. Now on clear liquid diet.   GOC:  Extensive f/u GOC discussion with daughter, April [name] via telephone.   Discussed course of hospitalization including diagnoses, interventions, test/lab results, and recommendations from attending and surgical consult. Explained concern with progressive functional/nutritional status decline contributing to overall clinical picture and prognosis. Explained poor candidacy for surgical intervention.  Discussed malnutrition and daughter asks about feeding tube placement. Explained risks/benefits of feeding tube and she may not even be a candidate for feeding tube placement. Discussed quality of life versus quantity and that feeding tube would prolong life but likely not impact her quality. After further thought and discussion, April [name] shares her mother would most likely NOT want a feeding tube.   Patient and daughter have continuously expressed desire to return home on discharge. Declining SNF to attempt rehab. Discussed the fact that she is not able to tolerate aggressive physical therapy with malnutrition and diagnoses since admit.   The difference between aggressive medical intervention and comfort care was considered. Explained ongoing aggressive interventions would include continued hospitalization and high risk for recurrent hospitalization with malnutrition.   Introduced  hospice philosophy and options. Explained goal of comfort, symptom management, quality, dignity at EOL. Educated on EOL expectations and prevention of recurrent hospitalization.  April tearful but acknowledges understanding of how sick her mother is and that she will likely most benefit from hospice services on discharge.   April wishes to f/u with PMT provider on Monday morning and attempt to include her mother in decision-making/GOC discussion. April [name] states that Dr. Mahala [name] plans to call her Sunday.  Questions and concerns were addressed.  Hard Choices booklet emailed. PMT contact information given. Emotional/spiritual support provided.    Length of Stay: 16  Current Medications: Scheduled Meds:  . amitriptyline  25 mg Oral QHS  . apixaban  5 mg Oral BID  . feeding supplement  1 Container Oral BID BM  . feeding supplement (ENSURE ENLIVE)  237 mL Oral  TID BM  . feeding supplement (PRO-STAT SUGAR FREE 64)  30 mL Oral Daily  . folic acid  1 mg Oral Daily  . furosemide  20 mg Oral Daily  . insulin aspart  0-9 Units Subcutaneous TID WC  . mouth rinse  15 mL Mouth Rinse BID  . metoprolol tartrate  37.5 mg Oral BID  . nicotine  21 mg Transdermal Daily  . polyethylene glycol  17 g Oral Daily  . potassium chloride  40 mEq Oral Daily  . saccharomyces boulardii  250 mg Oral BID  . thiamine  100 mg Oral Daily    Continuous Infusions: . aztreonam 2 g (04/21/19 1719)    PRN Meds: acetaminophen **OR** acetaminophen, clonazePAM, iohexol, metoprolol tartrate, ondansetron  Physical Exam Vitals signs and nursing note reviewed.  Constitutional:      General: She is awake.     Appearance: She is cachectic. She is ill-appearing.  Cardiovascular:     Rate and Rhythm: Normal rate.  Pulmonary:     Effort: No tachypnea, accessory muscle usage or respiratory distress.  Abdominal:     Tenderness: There is generalized abdominal tenderness and tenderness in the right upper quadrant.  Skin:     General: Skin is warm and dry.     Coloration: Skin is pale.  Neurological:     Mental Status: She is alert and oriented to person, place, and time.  Psychiatric:        Mood and Affect: Mood normal.        Speech: Speech normal.        Behavior: Behavior normal.            Vital Signs: BP 120/75 (BP Location: Left Arm)   Pulse 92   Temp 97.7 F (36.5 C) (Oral)   Resp 19   Ht  (1.676 m)   Wt 43.6 kg   SpO2 97%   BMI 15.51 kg/m  SpO2: SpO2: 97 % O2 Device: O2 Device: Room Air O2 Flow Rate: O2 Flow Rate (L/min): 2 L/min  Intake/output summary:   Intake/Output Summary (Last 24 hours) at 04/21/2019 1727 Last data filed at 04/21/2019 1632 Gross per 24 hour  Intake 610.07 ml  Output 100 ml  Net 510.07 ml   LBM: Last BM Date: 04/21/19 Baseline Weight: Weight: 49.9 kg Most recent weight: Weight: 43.6 kg       Palliative Assessment/Data: PPS 40%   Flowsheet Rows     Most Recent Value  Intake Tab  Referral Department  Hospitalist  Unit at Time of Referral  Med/Surg Unit  Palliative Care Primary Diagnosis  Sepsis/Infectious Disease  Palliative Care Type  New Palliative care  Reason for referral  Clarify Goals of Care  Date first seen by Palliative Care  04/18/19  Clinical Assessment  Palliative Performance Scale Score  40%  Psychosocial & Spiritual Assessment  Palliative Care Outcomes  Patient/Family meeting held?  Yes  Who was at the meeting?  patient and daughter  Palliative Care Outcomes  Clarified goals of care, Provided psychosocial or spiritual support, ACP counseling assistance      Patient Active Problem List   Diagnosis Date Noted  . Palliative care by specialist   . Goals of care, counseling/discussion   . Stress-induced cardiomyopathy 04/09/2019  . Respiratory failure (HCC) hypoxic and hypercapnic/ acute 04/09/2019  . Sepsis (HCC) 03/21/2019  . Acute pancreatitis 03/31/2019  . UTI (urinary tract infection) 04/07/2019  . Pancreatic pseudocyst  04/04/2019  . Portal vein thrombosis  May 03, 2019  . Hip fracture (HCC) 03/21/2019  . Displaced intertrochanteric fracture of left femur, initial encounter for closed fracture (HCC)   . Anaphylactic reaction 07/24/2018  . FTT (failure to thrive) in adult 05/18/2018  . Falls 05/18/2018  . Pancreatitis 10/05/2017  . Frequent falls 02/11/2017  . Protein-calorie malnutrition, severe 02/05/2017  . Gallstone pancreatitis 01/30/2017  . Hypokalemia 01/30/2017  . Macrocytic anemia 01/30/2017  . Metabolic acidosis 01/30/2017  . Neuropathy, alcoholic (HCC) 09/08/2016  . Neuropathy 12/11/2015  . Alcohol abuse 12/11/2015  . Tobacco abuse 12/11/2015  . Chronic venous insufficiency 09/13/2015  . Extremity numbness 09/13/2015    Palliative Care Assessment & Plan   Patient Profile: 73 y.o. female  with past medical history of anxiety, depression, ETOH and tobacco use, GERD, HTN, HLD, pancreatitis and recent left intertrochanteric femur fracture s/p intramedullary nailing on 03/22/19  admitted on 05/03/19 with hip pain and UTI symptoms. Hospital admission for sepsis secondary to UTI and acute pancreatitis. Hospital course complicated by stress-induced cardiomyopathy with EF 35-40%, acute cardiogenic pulmonary edema, acute respiratory failure, and recurrent fevers. Blood cultures 4/22 showed proteus mirabilis and negative on 4/26. Repeat blood cultures 5/4 positive for GNR. Patient also with portal vein thrombosis. GI consulted and recommending soft diet with 3-6 months anticoagulation. Receiving Eliquis. Severe protein calorie malnutrition likely due to alcoholism; daughter reports 1-3 shot glasses per day of brandy. Palliative medicine consultation for goals of care.   Assessment: Sepsis UTI Proteus mirabilis and GNR bacteremia Acute systolic heart failure/stress-induced cardiomyopathy Acute cardiogenic pulmonary edema Acute hypoxic and hypercapnic respiratory failure Acute pancreatitis complicated  with pseudocyst and portal vein thrombosis ETOH abuse  Acute metabolic encephalopathy Severe protein calorie malnutrition Deconditioning   Recommendations/Plan:  Continue FULL code/FULL scope treatment through the weekend. Watchful waiting.   F/u GOC discussion with daughter, who acknowledges understanding of diagnoses, interventions, and poor prognosis. Daughter shares that her mother would likely NOT wish for prolonged feeding tube placement. Patient has continued to decline SNF placement to attempt rehab. Introduced hospice philosophy and options. Daughter considering hospice services on discharge.   PMT NP to f/u with patient/daughter Monday AM. Will further discuss GOC, code status, and MOST form. Updated Dr. Mahala Menghini.  Code Status: FULL   Code Status Orders  (From admission, onward)         Start     Ordered   2019/05/03 2031  Full code  Continuous     05/03/2019 2031        Code Status History    Date Active Date Inactive Code Status Order ID Comments User Context   03/21/2019 2041 03/24/2019 1625 Full Code [id number]  Meredeth Ide, MD Inpatient   07/24/2018 2043 07/26/2018 1546 Full Code [id number]  Tonye Royalty, DO Inpatient   10/05/2017 0158 10/08/2017 1916 Full Code [id number]  Clydie Braun, MD ED   04/09/2017 2020 04/13/2017 1904 Full Code [id number]  Michael Litter, MD ED   02/11/2017 0851 02/12/2017 1705 Full Code [id number]  Haydee Salter, MD ED   01/30/2017 0305 02/06/2017 2122 Full Code [id number]  Danford, Earl Lites, MD Inpatient       Prognosis:   Guarded to poor prognosis  Discharge Planning:  To Be Determined  Care plan was discussed with daughter, RN, Updated Dr. Mahala Menghini  Thank you for allowing the Palliative Medicine Team to assist in the care of this patient.   Time In: 1620 Time Out: 1730 Total Time 70 Prolonged Time Billed yes  Greater than 50%  of this time was spent counseling and coordinating care related to the above assessment and  plan.  April Homans, DNP, FNP-C Palliative Medicine Team  Phone: [phone] Fax: [phone]  Please contact Palliative Medicine Team phone at [phone] for questions and concerns.

## 2019-04-22 ENCOUNTER — Encounter (HOSPITAL_COMMUNITY): Payer: Self-pay

## 2019-04-22 ENCOUNTER — Inpatient Hospital Stay (HOSPITAL_COMMUNITY): Payer: PPO

## 2019-04-22 ENCOUNTER — Other Ambulatory Visit: Payer: Self-pay | Admitting: Radiology

## 2019-04-22 ENCOUNTER — Other Ambulatory Visit: Payer: Self-pay | Admitting: Interventional Radiology

## 2019-04-22 DIAGNOSIS — R579 Shock, unspecified: Secondary | ICD-10-CM | POA: Diagnosis not present

## 2019-04-22 DIAGNOSIS — D62 Acute posthemorrhagic anemia: Secondary | ICD-10-CM

## 2019-04-22 DIAGNOSIS — I81 Portal vein thrombosis: Secondary | ICD-10-CM

## 2019-04-22 DIAGNOSIS — J90 Pleural effusion, not elsewhere classified: Secondary | ICD-10-CM

## 2019-04-22 DIAGNOSIS — K8501 Idiopathic acute pancreatitis with uninfected necrosis: Secondary | ICD-10-CM

## 2019-04-22 HISTORY — PX: IR ANGIOGRAM VISCERAL SELECTIVE: IMG657

## 2019-04-22 HISTORY — PX: IR ANGIOGRAM SELECTIVE EACH ADDITIONAL VESSEL: IMG667

## 2019-04-22 HISTORY — PX: IR US GUIDE VASC ACCESS RIGHT: IMG2390

## 2019-04-22 HISTORY — PX: IR EMBO ARTERIAL NOT HEMORR HEMANG INC GUIDE ROADMAPPING: IMG5448

## 2019-04-22 LAB — PROTIME-INR
INR: 2.1 — ABNORMAL HIGH (ref 0.8–1.2)
INR: 1.8 — ABNORMAL HIGH (ref 0.8–1.2)
Prothrombin Time: 22.9 seconds — ABNORMAL HIGH (ref 11.4–15.2)
Prothrombin Time: 20.9 seconds — ABNORMAL HIGH (ref 11.4–15.2)

## 2019-04-22 LAB — CBC
HCT: 25.5 % — ABNORMAL LOW (ref 36.0–46.0)
Hemoglobin: 8.3 g/dL — ABNORMAL LOW (ref 12.0–15.0)
MCH: 31.3 pg (ref 26.0–34.0)
MCHC: 32.5 g/dL (ref 30.0–36.0)
MCV: 96.2 fL (ref 80.0–100.0)
Platelets: 542 10*3/uL — ABNORMAL HIGH (ref 150–400)
RBC: 2.65 MIL/uL — ABNORMAL LOW (ref 3.87–5.11)
RDW: 16.1 % — ABNORMAL HIGH (ref 11.5–15.5)
WBC: 30.7 10*3/uL — ABNORMAL HIGH (ref 4.0–10.5)
nRBC: 0 % (ref 0.0–0.2)

## 2019-04-22 LAB — CBC WITH DIFFERENTIAL/PLATELET
Abs Immature Granulocytes: 2.39 10*3/uL — ABNORMAL HIGH (ref 0.00–0.07)
Abs Immature Granulocytes: 2.35 10*3/uL — ABNORMAL HIGH (ref 0.00–0.07)
Abs Immature Granulocytes: 0.47 10*3/uL — ABNORMAL HIGH (ref 0.00–0.07)
Basophils Absolute: 0.1 10*3/uL (ref 0.0–0.1)
Basophils Absolute: 0.1 10*3/uL (ref 0.0–0.1)
Basophils Absolute: 0 10*3/uL (ref 0.0–0.1)
Basophils Relative: 0 %
Basophils Relative: 0 %
Basophils Relative: 0 %
Eosinophils Absolute: 0 10*3/uL (ref 0.0–0.5)
Eosinophils Absolute: 0.1 10*3/uL (ref 0.0–0.5)
Eosinophils Absolute: 0 10*3/uL (ref 0.0–0.5)
Eosinophils Relative: 0 %
Eosinophils Relative: 0 %
Eosinophils Relative: 0 %
HCT: 18.6 % — ABNORMAL LOW (ref 36.0–46.0)
HCT: 14.8 % — ABNORMAL LOW (ref 36.0–46.0)
HCT: 17.6 % — ABNORMAL LOW (ref 36.0–46.0)
Hemoglobin: 5.6 g/dL — CL (ref 12.0–15.0)
Hemoglobin: 4.8 g/dL — CL (ref 12.0–15.0)
Hemoglobin: 6 g/dL — CL (ref 12.0–15.0)
Immature Granulocytes: 5 %
Immature Granulocytes: 4 %
Immature Granulocytes: 2 %
Lymphocytes Relative: 4 %
Lymphocytes Relative: 3 %
Lymphocytes Relative: 5 %
Lymphs Abs: 1.7 10*3/uL (ref 0.7–4.0)
Lymphs Abs: 1.6 10*3/uL (ref 0.7–4.0)
Lymphs Abs: 1.3 10*3/uL (ref 0.7–4.0)
MCH: 33.3 pg (ref 26.0–34.0)
MCH: 34 pg (ref 26.0–34.0)
MCH: 32.3 pg (ref 26.0–34.0)
MCHC: 30.1 g/dL (ref 30.0–36.0)
MCHC: 32.4 g/dL (ref 30.0–36.0)
MCHC: 34.1 g/dL (ref 30.0–36.0)
MCV: 110.7 fL — ABNORMAL HIGH (ref 80.0–100.0)
MCV: 105 fL — ABNORMAL HIGH (ref 80.0–100.0)
MCV: 94.6 fL (ref 80.0–100.0)
Monocytes Absolute: 2.3 10*3/uL — ABNORMAL HIGH (ref 0.1–1.0)
Monocytes Absolute: 2.3 10*3/uL — ABNORMAL HIGH (ref 0.1–1.0)
Monocytes Absolute: 1.6 10*3/uL — ABNORMAL HIGH (ref 0.1–1.0)
Monocytes Relative: 5 %
Monocytes Relative: 4 %
Monocytes Relative: 6 %
Neutro Abs: 39.4 10*3/uL — ABNORMAL HIGH (ref 1.7–7.7)
Neutro Abs: 46.8 10*3/uL — ABNORMAL HIGH (ref 1.7–7.7)
Neutro Abs: 22.5 10*3/uL — ABNORMAL HIGH (ref 1.7–7.7)
Neutrophils Relative %: 86 %
Neutrophils Relative %: 89 %
Neutrophils Relative %: 87 %
Platelets: 767 10*3/uL — ABNORMAL HIGH (ref 150–400)
Platelets: 689 10*3/uL — ABNORMAL HIGH (ref 150–400)
Platelets: 486 10*3/uL — ABNORMAL HIGH (ref 150–400)
RBC: 1.68 MIL/uL — ABNORMAL LOW (ref 3.87–5.11)
RBC: 1.41 MIL/uL — ABNORMAL LOW (ref 3.87–5.11)
RBC: 1.86 MIL/uL — ABNORMAL LOW (ref 3.87–5.11)
RDW: 15.6 % — ABNORMAL HIGH (ref 11.5–15.5)
RDW: 15.6 % — ABNORMAL HIGH (ref 11.5–15.5)
RDW: 17.3 % — ABNORMAL HIGH (ref 11.5–15.5)
WBC Morphology: INCREASED
WBC: 45.9 10*3/uL — ABNORMAL HIGH (ref 4.0–10.5)
WBC: 53.3 10*3/uL (ref 4.0–10.5)
WBC: 25.9 10*3/uL — ABNORMAL HIGH (ref 4.0–10.5)
nRBC: 0 % (ref 0.0–0.2)
nRBC: 0 % (ref 0.0–0.2)
nRBC: 0 % (ref 0.0–0.2)

## 2019-04-22 LAB — GLUCOSE, CAPILLARY
Glucose-Capillary: 198 mg/dL — ABNORMAL HIGH (ref 70–99)
Glucose-Capillary: 161 mg/dL — ABNORMAL HIGH (ref 70–99)
Glucose-Capillary: 151 mg/dL — ABNORMAL HIGH (ref 70–99)
Glucose-Capillary: 184 mg/dL — ABNORMAL HIGH (ref 70–99)

## 2019-04-22 LAB — COMPREHENSIVE METABOLIC PANEL
ALT: 16 U/L (ref 0–44)
AST: 28 U/L (ref 15–41)
Albumin: 1.9 g/dL — ABNORMAL LOW (ref 3.5–5.0)
Alkaline Phosphatase: 108 U/L (ref 38–126)
Anion gap: 18 — ABNORMAL HIGH (ref 5–15)
BUN: 16 mg/dL (ref 8–23)
CO2: 17 mmol/L — ABNORMAL LOW (ref 22–32)
Calcium: 7.8 mg/dL — ABNORMAL LOW (ref 8.9–10.3)
Chloride: 98 mmol/L (ref 98–111)
Creatinine, Ser: 0.58 mg/dL (ref 0.44–1.00)
GFR calc Af Amer: >60 mL/min (ref >60)
GFR calc non Af Amer: >60 mL/min (ref >60)
Glucose, Bld: 208 mg/dL — ABNORMAL HIGH (ref 70–99)
Potassium: 4.3 mmol/L (ref 3.5–5.1)
Sodium: 133 mmol/L — ABNORMAL LOW (ref 135–145)
Total Bilirubin: 0.7 mg/dL (ref 0.3–1.2)
Total Protein: 5.1 g/dL — ABNORMAL LOW (ref 6.5–8.1)

## 2019-04-22 LAB — PREALBUMIN: Prealbumin: 6.6 mg/dL — ABNORMAL LOW (ref 18–38)

## 2019-04-22 LAB — PREPARE RBC (CROSSMATCH)

## 2019-04-22 MED ORDER — SODIUM CHLORIDE 0.9% IV SOLUTION
Freq: Once | INTRAVENOUS | Status: AC
  Administered 2019-04-22: 18:00:00 via INTRAVENOUS

## 2019-04-22 MED ORDER — IOHEXOL 350 MG/ML SOLN
100.0000 mL | Freq: Once | INTRAVENOUS | Status: AC | PRN
  Administered 2019-04-22: 100 mL via INTRAVENOUS

## 2019-04-22 MED ORDER — SODIUM CHLORIDE 0.9 % IV BOLUS: 250.0000 mL | Freq: Once | INTRAVENOUS | Status: DC

## 2019-04-22 MED ORDER — FENTANYL CITRATE (PF) 100 MCG/2ML IJ SOLN
INTRAMUSCULAR | Status: AC | PRN
  Administered 2019-04-22 (×3): 25 ug via INTRAVENOUS

## 2019-04-22 MED ORDER — MIDAZOLAM HCL 2 MG/2ML IJ SOLN
INTRAMUSCULAR | Status: AC | PRN
  Administered 2019-04-22 (×2): 0.5 mg via INTRAVENOUS

## 2019-04-22 MED ORDER — GELATIN ABSORBABLE 12-7 MM EX MISC
CUTANEOUS | Status: AC
  Filled 2019-04-22: qty 1

## 2019-04-22 MED ORDER — PANTOPRAZOLE SODIUM 40 MG IV SOLR
40.0000 mg | INTRAVENOUS | Status: DC
  Administered 2019-04-22 – 2019-04-23 (×2): 40 mg via INTRAVENOUS
  Filled 2019-04-22 (×2): qty 40

## 2019-04-22 MED ORDER — IOHEXOL 300 MG/ML  SOLN: 125.0000 mL | Freq: Once | INTRAMUSCULAR | Status: DC | PRN

## 2019-04-22 MED ORDER — SODIUM CHLORIDE (PF) 0.9 % IJ SOLN
INTRAMUSCULAR | Status: AC
  Filled 2019-04-22: qty 50

## 2019-04-22 MED ORDER — INSULIN ASPART 100 UNIT/ML ~~LOC~~ SOLN
0.0000 [IU] | SUBCUTANEOUS | Status: DC
  Administered 2019-04-23: 2 [IU] via SUBCUTANEOUS
  Administered 2019-04-23: 1 [IU] via SUBCUTANEOUS
  Administered 2019-04-23 – 2019-04-24 (×3): 2 [IU] via SUBCUTANEOUS
  Administered 2019-04-24: 1 [IU] via SUBCUTANEOUS

## 2019-04-22 MED ORDER — MIDAZOLAM HCL 2 MG/2ML IJ SOLN
INTRAMUSCULAR | Status: AC
  Filled 2019-04-22: qty 2

## 2019-04-22 MED ORDER — FENTANYL CITRATE (PF) 100 MCG/2ML IJ SOLN
50.0000 ug | Freq: Once | INTRAMUSCULAR | Status: AC
  Administered 2019-04-23: 50 ug via INTRAVENOUS
  Filled 2019-04-22: qty 2

## 2019-04-22 MED ORDER — FENTANYL CITRATE (PF) 100 MCG/2ML IJ SOLN
INTRAMUSCULAR | Status: AC
  Filled 2019-04-22: qty 2

## 2019-04-22 MED ORDER — SODIUM CHLORIDE 0.9 % IV BOLUS
250.0000 mL | Freq: Once | INTRAVENOUS | Status: AC
  Administered 2019-04-22: 250 mL via INTRAVENOUS

## 2019-04-22 MED ORDER — DIPHENHYDRAMINE HCL 25 MG PO CAPS
25.0000 mg | ORAL_CAPSULE | Freq: Once | ORAL | Status: DC
  Filled 2019-04-22: qty 1

## 2019-04-22 MED ORDER — SODIUM CHLORIDE 0.9% IV SOLUTION
Freq: Once | INTRAVENOUS | Status: AC
  Administered 2019-04-22: 19:00:00 via INTRAVENOUS

## 2019-04-22 MED ORDER — LIDOCAINE HCL 1 % IJ SOLN
INTRAMUSCULAR | Status: AC
  Filled 2019-04-22: qty 20

## 2019-04-22 MED ORDER — SODIUM CHLORIDE 0.9% IV SOLUTION
Freq: Once | INTRAVENOUS | Status: AC
  Administered 2019-04-23: 01:00:00 via INTRAVENOUS

## 2019-04-22 MED ORDER — SODIUM CHLORIDE 0.9 % IV BOLUS
1000.0000 mL | Freq: Once | INTRAVENOUS | Status: AC
  Administered 2019-04-22: 1000 mL via INTRAVENOUS

## 2019-04-22 MED ORDER — FUROSEMIDE 10 MG/ML IJ SOLN
20.0000 mg | Freq: Once | INTRAMUSCULAR | Status: AC
  Administered 2019-04-22: 20 mg via INTRAVENOUS
  Filled 2019-04-22: qty 2

## 2019-04-22 MED ORDER — DILTIAZEM HCL 100 MG IV SOLR
5.0000 mg/h | INTRAVENOUS | Status: DC
  Administered 2019-04-22: 5 mg/h via INTRAVENOUS
  Administered 2019-04-23 – 2019-04-24 (×3): 10 mg/h via INTRAVENOUS
  Filled 2019-04-22 (×6): qty 100

## 2019-04-22 MED ORDER — DILTIAZEM HCL-DEXTROSE 100-5 MG/100ML-% IV SOLN (PREMIX)
5.0000 mg/h | INTRAVENOUS | Status: DC
  Filled 2019-04-22: qty 100

## 2019-04-22 MED ORDER — SODIUM CHLORIDE 0.9 % IV BOLUS
500.0000 mL | Freq: Once | INTRAVENOUS | Status: AC
  Administered 2019-04-22: 500 mL via INTRAVENOUS

## 2019-04-22 NOTE — Procedures (Signed)
Acute hemorrhage LUQ along pancreas and posterior to the stomach secondary to complicated pancreatitis, suspect left gastric origin  S/p celiac and left gastric angio with left gastric micro coil and gelfoam embolization for active bleeding  No comp Stable ebl min Full report in pacs

## 2019-04-22 NOTE — Progress Notes (Signed)
Palliative care progress note  Reason for consult: Goals of care in light of abscess/necrotic pancreatitis  Discussed with Dr. Mahala Menghini.  Chart reviewed and discussed with bedside RN.  Patient is on her way for IR procedure.  I called and was able to reach her daughter, Yvonne Kendall.    We discussed patient's clinical course over the past 24 hours in detail, including dropping Hgb, increased WBC, and concern for acute hemorrhage.  Reviewed labs and results of imaging.  She reports good understanding of current situation.  I expressed concern that her mother may continue to decline and we may be approaching end of life if this is the case.  She reports this is upsetting, but she appreciates honesty surrounding situation.    - Full code/full scope treatment -  PMT to continue to follow, progress conversation as possible, and support holistically.  Total time: 40 minutes Greater than 50%  of this time was spent counseling and coordinating care related to the above assessment and plan.  Romie Minus, MD [facility] Palliative Medicine Team [phone]

## 2019-04-22 NOTE — Progress Notes (Signed)
Patient noted with hypotension, drop in hgb (5.6) and increased WBC (45.9). INR 2.1. Albumin 1.9. Complains of abdominal pain. Low volume hematemesis by report. Plain films with no pneumoperitoneum.   She is chronically ill appearing, cachectic  Unlabored respirations Abdomen is distended, diffusely tender and tympanic, there is voluntary guarding  CT 2 days ago with acute pancreatitis, small but growing pancreatic pseudocyst, portal vein thrombosis. She is now on eliquis and IV abx. Additional issues of severe malnutrition/ deconditioning, hepatic dysfunction/ hepatomegaly, alcohol abuse/ tobacco abuse, acute systolic heart failure with stress-induced cardiomyopathy complicated by acute cardiogenic pulmonary edema and acute hypoxic respiratory failure. UTI sepsis with Proteus mirabilis and Enterobacter bacteremia. Hx fall with ORIF left femur fx last month.   Unclear what has precipitated her decompensation overnight, differential still includes perforation despite negative plain films, but also hemorrhagic complication of her pancreatitis, superinfection of the pseudocyst, or non-occlusive mesenteric ischemia from the portal vein thrombosis and venous congestion...  The patient has met with palliative care yesterday. I do not think she will survive an operation (and at this point surgery is not indicated) and she does not want to undergo surgery. I have discussed with her the gravity of her current state and she seems to understand. She is being transferred to the ICU where she will need resuscitated with PRBC, FFP and crystalloid as needed to correct anemia and coagulopathy, consider repeat CT to assess for evidence of drainable abscess or for active extravasation which may be amenable to IR intervention.

## 2019-04-22 NOTE — Consult Note (Signed)
Chief Complaint: Patient was seen in consultation today for visceral/mesenteric arteriogram with possible embolization Chief Complaint  Patient presents with   Hip Pain   Urinary Tract Infection    Referring Physician(s): Samtani,J  Supervising Physician: Ruel Favors  Patient Status: [facility] - In-pt  History of Present Illness: BILL MCVEY is a 73 y.o. female with history of tobacco and alcohol abuse, GERD, bipolar disorder, hypertension, depression, status post hospitalization from 4/7 to 03/24/2019 secondary to displaced intertrochanteric fracture of the left femur with subsequent intramedullary nail placement on 4/8.  She returned to the hospital on 4/22 with hip pain and urinary tract infection as well as abdominal pain, fever, confusion and anorexia.  Subsequent CT at that time revealed acute pancreatitis with fluid collection adjacent to the pancreatic tail and posterior fundus of the stomach.  She was also noted to have a nonocclusive splenic vein/portal vein thrombus at the confluence of the splenic vein, SMV and portal vein.  She had been on Eliquis with last dose yesterday.  She now presents with hypertension, worsening anemia, nausea, dark bloody emesis. Blood cultures were positive for Proteus.  Request originally received for image guided drainage of pancreatic tail fluid collection but subsequent CT angiogram of the abdomen obtained today revealed active extravasation/hemorrhage into the previously demonstrated pancreatic fluid collection/hematoma secondary to necrotizing pancreatitis with increase in hematoma size along with free peritoneal fluid/peritoneal hemorrhage and potentially developing ileus.  Request now received for emergent visceral/mesenteric arteriogram with possible embolization.   Past Medical History:  Diagnosis Date   Anxiety    Current smoker    Depression    ETOH abuse    GERD (gastroesophageal reflux disease)    History of shingles summer  2015   HTN (hypertension)    Hyperlipidemia    Hypertension    Leg edema    Neuropathy    Neuropathy    Pancreatitis     Past Surgical History:  Procedure Laterality Date   ERCP N/A 02/01/2017   Procedure: ENDOSCOPIC RETROGRADE CHOLANGIOPANCREATOGRAPHY (ERCP);  Surgeon: Dorena Cookey, MD;  Location: [facility];  Service: Endoscopy;  Laterality: N/A;   INTRAMEDULLARY (IM) NAIL INTERTROCHANTERIC Left 03/22/2019   Procedure: INTRAMEDULLARY (IM) NAIL INTERTROCHANTRIC;  Surgeon: Kathryne Hitch, MD;  Location: [facility];  Service: Orthopedics;  Laterality: Left;   LAPAROSCOPIC CHOLECYSTECTOMY WITH COMMON DUCT EXPLORATION AND INTRAOP CHOLANGIOGRAM N/A 02/03/2017   Procedure: LAPAROSCOPIC CHOLECYSTECTOMY WITH POSSIBLE COMMON DUCT EXPLORATION AND INTRAOP CHOLANGIOGRAM;  Surgeon: Ovidio Kin, MD;  Location: [facility];  Service: General;  Laterality: N/A;   removal of uterine fibroids  2004    Allergies: Naltrexone; Rocephin [ceftriaxone sodium in dextrose]; and Penicillins  Medications: Prior to Admission medications   Medication Sig Start Date End Date Taking? Authorizing Provider  amitriptyline (ELAVIL) 25 MG tablet Take 25 mg by mouth at bedtime as needed for sleep.  03/23/19  Yes [provider]  aspirin EC 325 MG EC tablet Take 1 tablet (325 mg total) by mouth daily with breakfast. 03/24/19  Yes Pokhrel, Laxman, MD  calcium-vitamin D (OSCAL WITH D) 500-200 MG-UNIT tablet Take 1 tablet by mouth 2 (two) times daily. 03/24/19  Yes Pokhrel, Laxman, MD  clonazePAM (KLONOPIN) 0.5 MG tablet Take 0.5 mg by mouth 2 (two) times daily as needed for anxiety. as directed 08/05/17  Yes [provider]  folic acid (FOLVITE) 1 MG tablet Take 1 tablet (1 mg total) by mouth daily. 03/24/19  Yes Pokhrel, Laxman, MD  magnesium oxide (MAG-OX) 400 (241.3  Mg) MG tablet Take 1 tablet (400 mg total) by mouth 2 (two) times daily. 03/24/19  Yes Pokhrel, Laxman, MD  methocarbamol (ROBAXIN) 500  MG tablet Take 1 tablet (500 mg total) by mouth every 6 (six) hours as needed for muscle spasms. 04/29/2019  Yes Kathryne Hitch, MD  ondansetron (ZOFRAN) 4 MG tablet Take 1 tablet (4 mg total) by mouth every 6 (six) hours as needed for nausea. 03/24/19  Yes Pokhrel, Laxman, MD  oxyCODONE-acetaminophen (PERCOCET/ROXICET) 5-325 MG tablet Take 1 tablet by mouth every 6 (six) hours as needed for severe pain. Apr 26, 2019  Yes Kathryne Hitch, MD  pantoprazole (PROTONIX) 40 MG tablet Take 1 tablet (40 mg total) by mouth daily for 15 days. 03/24/19 04/08/19 Yes Pokhrel, Laxman, MD  promethazine (PHENERGAN) 25 MG tablet Take 25 mg by mouth every 6 (six) hours as needed. for nausea 01/24/19  Yes [provider]  thiamine 100 MG tablet Take 1 tablet (100 mg total) by mouth daily. 03/24/19  Yes Pokhrel, Laxman, MD  feeding supplement, ENSURE ENLIVE, (ENSURE ENLIVE) LIQD Take 237 mLs by mouth 2 (two) times daily between meals. Patient not taking: Reported on 03/21/2019 07/26/18   Narda Bonds, MD  Multiple Vitamin (MULTIVITAMIN WITH MINERALS) TABS tablet Take 1 tablet by mouth daily. Patient not taking: Reported on 03/21/2019 04/13/17   Penny Pia, MD  polyethylene glycol [facility] / Ethelene Hal) packet Take 17 g by mouth daily. Patient not taking: Reported on 03/21/2019 07/27/18   Narda Bonds, MD     Family History  Problem Relation Age of Onset   Heart disease Father    Neuropathy Neg Hx     Social History   Socioeconomic History   Marital status: Widowed    Spouse name: Not on file   Number of children: 1   Years of education: 61   Highest education level: Not on file  Occupational History   Not on file  Social Needs   Financial resource strain: Not on file   Food insecurity:    Worry: Not on file    Inability: Not on file   Transportation needs:    Medical: Not on file    Non-medical: Not on file  Tobacco Use   Smoking status: Current Every Day Smoker     Packs/day: 1.00    Types: Cigarettes   Smokeless tobacco: Never Used  Substance and Sexual Activity   Alcohol use: Yes    Alcohol/week: 14.0 standard drinks    Types: 14 Shots of liquor per week    Comment: states., "I keep a glass with me", reports drinking today (04/09/17)   Drug use: No   Sexual activity: Not Currently  Lifestyle   Physical activity:    Days per week: Not on file    Minutes per session: Not on file   Stress: Not on file  Relationships   Social connections:    Talks on phone: Not on file    Gets together: Not on file    Attends religious service: Not on file    Active member of club or organization: Not on file    Attends meetings of clubs or organizations: Not on file    Relationship status: Not on file  Other Topics Concern   Not on file  Social History Narrative   Lives at home with daughter Yvonne Kendall   Caffeine use: Drinks coffee 1/day         Review of Systems see above  Vital Signs:  BP (!) 86/59    Pulse (!) 107    Temp 98 F (36.7 C) (Axillary)    Resp (!) 39    Ht 5\' 6"  (1.676 m)    Wt 97 lb 8 oz (44.2 kg)    SpO2 100%    BMI 15.74 kg/m   Physical Exam patient awake but exhibits some confusion.  Chest with slightly diminished breath sounds left base, right clear.  Heart with tachycardic but regular rhythm.  Abdomen distended, few bowel sounds, tender to palpation; no significant lower extremity edema  Imaging: Dg Chest 1 View  Result Date: 04/09/2019 CLINICAL DATA:  Shortness of breath. EXAM: CHEST  1 VIEW COMPARISON:  April 08, 2019 FINDINGS: Bilateral pulmonary infiltrates persist with mild interval improvement. Bilateral pleural effusions remain. No other changes. IMPRESSION: Persistent but improving bilateral pulmonary infiltrates. Stable pleural effusions. Electronically Signed   By: Gerome Sam III M.D   On: 04/09/2019 04:41   Dg Chest 1 View  Result Date: 04/08/2019 CLINICAL DATA:  Shortness of breath with fever. EXAM: CHEST   1 VIEW COMPARISON:  04/08/2019 FINDINGS: Marked progression of BILATERAL pulmonary opacities, upper lobe predominant, but also affecting the lower lobes. Small effusions may be present. Unchanged and normal heart size. Calcified tortuous aorta. Osteopenia. IMPRESSION: Worsening aeration. The observed pattern of pulmonary opacities is more typical of infection, but pulmonary edema is not excluded. Electronically Signed   By: Elsie Stain M.D.   On: 04/08/2019 08:42   Dg Chest 2 View  Result Date: 04/20/2019 CLINICAL DATA:  Shortness of breath EXAM: CHEST - 2 VIEW COMPARISON:  04/17/2019 FINDINGS: Cardiac silhouette is unchanged. Emphysematous changes are noted bilaterally. There is a small left-sided pleural effusion. There is a persistent retrocardiac opacity favored to represent compressive atelectasis. Pleuroparenchymal scarring is noted at the lung apices. There is osteopenia. IMPRESSION: 1. Persistent small left-sided pleural effusion, slightly increased from prior study. 2. Slight interval worsening of a retrocardiac opacity which is favored to represent atelectasis. 3. Emphysematous changes. Electronically Signed   By: Katherine Mantle M.D.   On: 04/20/2019 16:43   Dg Chest 2 View  Result Date: 04/17/2019 CLINICAL DATA:  73 year old female with fever of 101.9 EXAM: CHEST - 2 VIEW COMPARISON:  04/09/2019, 04/08/2019 FINDINGS: Cardiomediastinal silhouette unchanged in size and contour. Improved aeration of the bilateral lungs. No interlobular septal thickening. No pneumothorax. Minimal blunting at the left costophrenic angle with meniscus on the lateral view. Pleuroparenchymal thickening at the apices. No new confluent airspace disease. Stigmata of emphysema, with increased retrosternal airspace, flattened hemidiaphragms, increased AP diameter, and hyperinflation on the AP view. Osteopenia with no displaced fracture. IMPRESSION: Improving appearance of the lungs with residual trace pleural effusions.  Emphysema Electronically Signed   By: Gilmer Mor D.O.   On: 04/17/2019 15:28   Ct Abdomen Pelvis W Contrast  Result Date: 04/20/2019 CLINICAL DATA:  Acute pancreatitis, abdominal pain, fever, abscess suspected EXAM: CT ABDOMEN AND PELVIS WITH CONTRAST TECHNIQUE: Multidetector CT imaging of the abdomen and pelvis was performed using the standard protocol following bolus administration of intravenous contrast. CONTRAST:  80mL OMNIPAQUE IOHEXOL 300 MG/ML  SOLN COMPARISON:  CT abdomen pelvis, 03/24/2019 FINDINGS: Lower chest: There are new small, left greater than right bilateral pleural effusions with associated atelectasis or consolidation. Coronary artery calcifications. Hepatobiliary: Hepatomegaly, maximum coronal span 22 cm. No focal liver abnormality is seen. Status post cholecystectomy. No biliary dilatation. Pancreas: Redemonstrated inflammatory stranding and fluid about the body and tail of the pancreas. There  has been interval enlargement of a fluid collection abutting the tail of the pancreas and lesser curvature of stomach, measuring approximately 4.3 x 2.1 x 4.6 cm (series 2, image 26, series 5, image 32). Pancreatic parenchymal calcifications. Spleen: Normal in size without focal abnormality. Adrenals/Urinary Tract: Adrenal glands are unremarkable. Kidneys are normal, without renal calculi, focal lesion, or hydronephrosis. Distended urinary bladder. Stomach/Bowel: Stomach is within normal limits. No evidence of bowel wall thickening, distention, or inflammatory changes. Sigmoid diverticulosis. Vascular/Lymphatic: Severe mixed calcific atherosclerosis. No enlarged abdominal or pelvic lymph nodes. Reproductive: Calcified uterine fibroids. Other: No abdominal wall hernia or abnormality. Small volume 4 quadrant ascites. Musculoskeletal: No acute or significant osseous findings. IMPRESSION: 1. Redemonstrated inflammatory stranding and fluid about the body and tail of the pancreas. There has been interval  enlargement of a fluid collection abutting the tail of the pancreas and lesser curvature of stomach, measuring approximately 4.3 x 2.1 x 4.6 cm (series 2, image 26, series 5, image 32). Findings concerning for abscess or other acute pancreatic fluid collection complicating acute pancreatitis. 2.  Small volume ascites and pleural effusions. 3.  Distended urinary bladder.  Correlate for urinary retention. 4.  Other chronic and incidental findings as detailed above. Electronically Signed   By: Lauralyn PrimesAlex  Bibbey M.D.   On: 04/20/2019 16:43   Ct Abdomen Pelvis W Contrast  Addendum Date: August 11, 2019   ADDENDUM REPORT: 0August 28, 2020 20:04 ADDENDUM: Addendum created after discussing role of interventional radiology with Dr. Anitra LauthPlunkett. In addition to the previously noted findings, there is also nonocclusive splenic vein/portal vein thrombus at the confluence of splenic vein, superior mesenteric vein, and the portal vein. This is likely a consequence of the pancreatitis/inflammation. These results were discussed by telephone at the time of interpretation on August 11, 2019 at 8:04 pm with Dr. Gwyneth SproutWHITNEY PLUNKETT Electronically Signed   By: Gilmer MorJaime  Wagner D.O.   On: 0August 28, 2020 20:04   Result Date: August 11, 2019 CLINICAL DATA:  Abdominal pain, fever EXAM: CT ABDOMEN AND PELVIS WITH CONTRAST TECHNIQUE: Multidetector CT imaging of the abdomen and pelvis was performed using the standard protocol following bolus administration of intravenous contrast. CONTRAST:  75mL OMNIPAQUE IOHEXOL 300 MG/ML  SOLN COMPARISON:  CT abdomen/pelvis 01/31/2019 FINDINGS: Lower chest: No acute abnormality. Hepatobiliary: No focal liver abnormality is seen. Status post cholecystectomy. No biliary dilatation. Pancreas: Calcifications in the pancreas consistent with history of chronic pancreatitis. Peripancreatic inflammatory changes around the mid body and tail most concerning for acute pancreatitis. 2.1 cm hypodense fluid collection adjacent to the pancreatic tail and  posterior fundus of the stomach concerning for a pseudocyst. Spleen: Choose Adrenals/Urinary Tract: Adrenal glands are unremarkable. Kidneys are normal, without renal calculi, focal lesion, or hydronephrosis. Bladder is unremarkable. Stomach/Bowel: Stomach is within normal limits. Appendix appears normal. No evidence of bowel wall thickening, distention, or inflammatory changes. Diverticulosis without evidence of diverticulitis. Vascular/Lymphatic: Abdominal aortic atherosclerosis. No lymphadenopathy. Reproductive: Uterine fibroid.  No other adnexal mass. Other: No abdominal wall hernia or abnormality. No abdominopelvic ascites. Musculoskeletal: No acute osseous abnormality. No aggressive osseous lesion. Degenerative disc disease with disc height loss at L5-S1 with bilateral facet arthropathy. IMPRESSION: 1. Acute pancreatitis involving the body and tail the pancreas. 2.1 cm hypodense fluid collection adjacent to the pancreatic tail and posterior fundus of the stomach concerning for a pseudocyst which appears to be within the posterior wall of the stomach. 2. Diverticulosis without evidence of diverticulitis. 3.  Aortic Atherosclerosis (ICD10-I70.0). Electronically Signed: By: Elige KoHetal  Patel On: 0August 28, 2020 17:33   Dg Chest Port 1  View  Result Date: 04/22/2019 CLINICAL DATA:  Central line placement. EXAM: PORTABLE CHEST 1 VIEW COMPARISON:  04/20/2011 FINDINGS: A LEFT subclavian central venous catheter is noted with tip overlying the LOWER SVC. Cardiomediastinal silhouette is unchanged. LEFT LOWER lung consolidation/atelectasis/pleural effusion again noted. There is no evidence of pneumothorax. IMPRESSION: LEFT subclavian central venous catheter placement with tip overlying the LOWER SVC. No pneumothorax. Little significant change of LEFT LOWER lung consolidation/atelectasis/pleural effusion. Electronically Signed   By: Harmon Pier M.D.   On: 04/22/2019 11:44   Dg Chest Port 1 View  Result Date:  May 04, 2019 CLINICAL DATA:  73 year old female with a history of left hip pain EXAM: PORTABLE CHEST 1 VIEW COMPARISON:  03/21/2019, 07/24/2018 FINDINGS: Cardiomediastinal silhouette unchanged in size and contour. No evidence of central vascular congestion. No interlobular septal thickening. No pneumothorax. No pleural effusion. Coarsened interstitial markings are similar to the comparison. No confluent airspace disease. No displaced fracture. IMPRESSION: Chronic changes without evidence of acute cardiopulmonary disease Electronically Signed   By: Gilmer Mor D.O.   On: 05-04-19 15:29   Dg Abd 2 Views  Result Date: 04/22/2019 CLINICAL DATA:  Acute right-sided abdominal pain. EXAM: ABDOMEN - 2 VIEW COMPARISON:  CT scan of Apr 20, 2019. Radiographs of October 18, 2018. FINDINGS: Mildly dilated small bowel loops are noted in the left lower quadrant which may represent ileus or possibly obstruction. No free air is noted to suggest pneumoperitoneum. Atherosclerosis of abdominal aorta is noted. Status post cholecystectomy. No significant colonic dilatation is noted. IMPRESSION: Mildly dilated small bowel loops are noted in the left lower quadrant of the abdomen which may represent ileus or possibly obstruction. Aortic Atherosclerosis (ICD10-I70.0). Electronically Signed   By: Lupita Raider M.D.   On: 04/22/2019 08:41   Dg Hip Unilat With Pelvis 2-3 Views Right  Result Date: 05-04-2019 CLINICAL DATA:  73 year old female with pain. Prior left hip fracture fixation 03/22/2019 EXAM: DG HIP (WITH OR WITHOUT PELVIS) 2-3V RIGHT COMPARISON:  CT today FINDINGS: Retained contrast within the urinary bladder, distended. Formed stool within the rectum. Purewick urine collection system. Bony pelvic ring appears intact with no acute displaced fracture. Right hip projects normally over the acetabula with no proximal right femoral fracture identified. Surgical changes of left hip repair with no significant callus formation at  the site of recent hip fracture. Vascular calcifications. IMPRESSION: Negative for acute bony abnormality. Urinary bladder with excreted contrast, partially distended. Formed stool within the rectal vault. Atherosclerosis. Electronically Signed   By: Gilmer Mor D.O.   On: 05/04/2019 21:15   Ct Angio Abd/pel W/ And/or W/o  Result Date: 04/22/2019 CLINICAL DATA:  73 year old female with a history of necrotizing pancreatitis and hypotension EXAM: CTA ABDOMEN AND PELVIS wITHOUT AND WITH CONTRAST TECHNIQUE: Multidetector CT imaging of the abdomen and pelvis was performed using the standard protocol during bolus administration of intravenous contrast. Multiplanar reconstructed images and MIPs were obtained and reviewed to evaluate the vascular anatomy. CONTRAST:  OMNIPAQUE IOHEXOL 350 MG/ML SOLN COMPARISON:  04/20/2019, May 04, 2019 FINDINGS: VASCULAR Aorta: Atherosclerosis of the thoracic aorta. Greatest diameter of the aorta at the aortic hiatus measures 2.4 cm. Atherosclerosis of the abdominal aorta. Diameter of infrarenal aorta just below the renal artery origins measures 2.4 cm. Celiac: Celiac artery patent with mild atherosclerotic changes. Celiac artery contributes to common hepatic artery, left gastric artery, splenic artery. There is a replaced left hepatic artery. There is presumed origin of the extravasated contrast into the new hematoma from left gastric branches,  however, no direct communication is identified on the CT. SMA: Atherosclerotic changes of the SMA origin. Renals: Atherosclerotic changes at the bilateral renal artery origins. Estimated 50% narrowing on the right. No high-grade stenosis on the left. IMA: IMA patent. Right lower extremity: Moderate atherosclerotic changes of the right common iliac and external iliac artery. Hypogastric artery is patent. Dense calcifications of the right common femoral artery. Left lower extremity: Moderate to advanced atherosclerotic changes of the left  iliac system including common iliac and external iliac artery. Hypogastric artery is patent. Common femoral artery with atherosclerotic calcifications. Veins: Unremarkable IVC and bilateral renal veins. Bilateral iliac veins patent. Redemonstration of thrombus at the spleno portal confluence with patency of the superior mesenteric vein. Splenic vein appears occluded. Multiple collateral draining veins of the left upper quadrant secondary to splenic vein occlusion, compatible with portal-portal and portal-systemic collateral veins. The portal-systemic collateral veins contribute to engorgement of the bilateral adnexal veins and the left gonadal vein. Review of the MIP images confirms the above findings. NON-VASCULAR Lower chest: Bilateral pleural effusions with associated atelectasis. Hepatobiliary: The enhancement/attenuation of the liver is uniform on the delayed images, with mottled appearance on the arterial images, reflecting delayed perfusion secondary to portal vein compromise. Cholecystectomy. Pancreas: Coarse calcifications throughout the pancreatic parenchyma. Pancreas is compressed by expanding hematoma/hemorrhage interposed between stomach and the pancreas. There is been significant enlargement in the interval, with the prior CT dated 04/19/2021 measuring hematoma 3.2 cm. Hematoma now measures 12.6 cm x 7.2 cm. Linear hyperdense focus at the superior margin of the hematoma on image 38 of series 15 is not present on the noncontrast series, compatible extravasated contrast/hemorrhage. Pancreatic parenchyma at the pancreas neck demonstrates mottled enhancement/attenuation. Spleen: Unremarkable Adrenals/Urinary Tract: Adenoma of the left adrenal gland. Right: No hydronephrosis. Symmetric perfusion to the left. No nephrolithiasis. Unremarkable course of the right ureter. Left: No hydronephrosis. Symmetric perfusion to the right. No nephrolithiasis. Unremarkable course of the left ureter. Circumferential wall  thickening of the urinary bladder which is partially distended. Stomach/Bowel: Stomach has been compressed by hematoma at the posterior wall. Borderline dilated small bowel throughout the abdomen. Wall enhancement appears within normal limits. No transition point identified. Appendix is not visualized, however, no inflammatory changes are present adjacent to the cecum to indicate an appendicitis. Colonic diverticula. Colon is relatively decompressed. Lymphatic: Multiple lymph nodes of the gastrohepatic ligament and liver hilum, as well as at the portacaval nodal stations, periaortic/preaortic nodal stations. Small lymph nodes of the inguinal region and the bilateral iliac nodal stations. Mesenteric: Interval increasing intermediate density fluid throughout the abdomen, right abdomen, left abdomen, and pelvis. Stranding within the mesentery. Reproductive: Fibroid uterus. Other: No hernia. Musculoskeletal: No acute displaced fracture. Surgical changes of the left hip. IMPRESSION: The CT angiogram is positive for active extravasation/hemorrhage into previously demonstrated peripancreatic hematoma related to necrotizing pancreatitis. Significant increased hematoma since the comparison CT, which now measures at least 12.6 cm in greatest diameter. These results were discussed by telephone at the time of interpretation on 04/22/2019 at 1:45pm with Dr. Rhetta Mura Increased free peritoneal fluid, compatible with peritoneal hemorrhage. Redemonstration of necrotizing pancreatitis superimposed on changes of chronic pancreatitis. This is the favored etiology of multiple lymph nodes of the upper abdomen. Redemonstration of portal vein thrombus at the spleno portal confluence with occluded splenic vein. The splenic vein occlusion contributes to multiple collateral draining veins of the left upper quadrant, as above. Aortic atherosclerosis, mesenteric arterial disease, bilateral renal arterial disease, and bilateral  iliofemoral disease. Aortic  Atherosclerosis (ICD10-I70.0). Partially distended small bowel, potentially a developing ileus. Additional ancillary findings as above. Signed, Yvone Neu. Reyne Dumas, RPVI [facility] Electronically Signed   By: Gilmer Mor D.O.   On: 04/22/2019 14:16    Labs:  CBC: Recent Labs    04/21/19 0504 04/22/19 0537 04/22/19 0744 04/22/19 1327  WBC 13.9* 45.9* 53.3* 30.7*  HGB 8.3* 5.6* 4.8* 8.3*  HCT 27.9* 18.6* 14.8* 25.5*  PLT 657* 767* 689* 542*    COAGS: Recent Labs    07/24/18 1112 04/22/19 0537 04/22/19 1327  INR 1.00 2.1* 1.8*    BMP: Recent Labs    04/19/19 0519 04/20/19 0919 04/21/19 0504 04/22/19 0537  NA 134* 136 135 133*  K 3.5 3.1* 3.4* 4.3  CL 97* 103 101 98  CO2 17*  GLUCOSE 187* 155* 120* 208*  BUN CALCIUM 8.2* 8.4* 8.4* 7.8*  CREATININE <0.30* <0.30* <0.30* 0.58  GFRNONAA NOT CALCULATED NOT CALCULATED NOT CALCULATED >60  GFRAA NOT CALCULATED NOT CALCULATED NOT CALCULATED >60    LIVER FUNCTION TESTS: Recent Labs    04/19/19 0519 04/20/19 0919 04/21/19 0504 04/22/19 0537  BILITOT 0.3 0.4 1.0 0.7  AST ALT ALKPHOS 99 136* 136* 108  PROT 5.1* 5.2* 5.7* 5.1*  ALBUMIN 2.0* 2.0* 2.2* 1.9*    TUMOR MARKERS: No results for input(s): AFPTM, CEA, CA199, CHROMGRNA in the last 8760 hours.  Assessment and Plan: 73 y.o. female with history of tobacco and alcohol abuse, GERD, bipolar disorder, hypertension, depression, status post hospitalization from 4/7 to 03/24/2019 secondary to displaced intertrochanteric fracture of the left femur with subsequent intramedullary nail placement on 4/8.  She returned to the hospital on 4/22 with hip pain and urinary tract infection as well as abdominal pain, fever, confusion and anorexia.  Subsequent CT at that time revealed acute pancreatitis with fluid collection adjacent to the pancreatic  tail and posterior fundus of the stomach.  She was also noted to have a nonocclusive splenic vein/portal vein thrombus at the confluence of the splenic vein, SMV and portal vein.  She had been on Eliquis with last dose yesterday.  She now presents with hypertension, worsening anemia, nausea, dark bloody emesis. Blood cultures were positive for Proteus.  Request originally received for image guided drainage of pancreatic tail fluid collection but subsequent CT angiogram of the abdomen obtained today revealed active extravasation/hemorrhage into the previously demonstrated pancreatic fluid collection/hematoma secondary to necrotizing pancreatitis with increase in hematoma size along with free peritoneal fluid/peritoneal hemorrhage and potentially developing ileus.  Request now received for emergent visceral/mesenteric arteriogram with possible embolization.  Latest imaging studies have been reviewed by Dr. Miles Costain.  Current WBC 30.7, hemoglobin 8.3, platelets 542 k, PT 20.9, INR 1.8, creatinine 0.58. Risks and benefits of procedure were discussed with the patient's daughter Bjorn Loser including, but not limited to bleeding, infection, vascular injury or contrast induced renal failure.  This interventional procedure involves the use of X-rays and because of the nature of the planned procedure, it is possible that we will have prolonged use of X-ray fluoroscopy.  Potential radiation risks to you include (but are not limited to) the following: - A slightly elevated risk for cancer  several years later in life. This risk is typically less than 0.5% percent. This risk is low in comparison to the normal incidence of human cancer, which is 33% for women  and 50% for men according to the [employer]. - Radiation induced injury can include skin redness, resembling a rash, tissue breakdown / ulcers and hair loss (which can be temporary or permanent).   The likelihood of either of these occurring depends on  the difficulty of the procedure and whether you are sensitive to radiation due to previous procedures, disease, or genetic conditions.   IF your procedure requires a prolonged use of radiation, you will be notified and given written instructions for further action.  It is your responsibility to monitor the irradiated area for the 2 weeks following the procedure and to notify your physician if you are concerned that you have suffered a radiation induced injury.    All of the patient's questions were answered, patient is agreeable to proceed.  Consent signed and in chart.      Thank you for this interesting consult.  I greatly enjoyed meeting MONTANNA HELLWIG and look forward to participating in their care.  A copy of this report was sent to the requesting provider on this date.  Electronically Signed: D. Jeananne Rama, PA-C 04/22/2019, 3:20 PM   I spent a total of  20 minutes   in face to face in clinical consultation, greater than 50% of which was counseling/coor minutesdinating care for visceral/mesenteric arteriogram with possible embolization

## 2019-04-22 NOTE — Progress Notes (Addendum)
Per CT, pt needs at least a 20G PIV on her FA or above. This RN attempted and was unsuccessful. IVT paged and is aware- will assist when able. Also, NG tube attempted in each nostril- unable to advance without great resistance. CN at bedside to assist. MD Samtani aware. Pt has not vomited yet this shift.

## 2019-04-22 NOTE — Progress Notes (Signed)
During AM report, saw pt's BP was 65/49 at 0541. Upon recheck, BP was still 76/57 and 250 ml NS bolus had been completed. Pt alert and oriented to self, place and year. Pt moaning and c/o "sharp pain" to right abdomen. Pt stated pain "happens quick" and rated it a 7 out of 10. Pt unable to tolerate lying flat. O2 Sats stable on RA, HR 101. MD Samtani paged and assessed pt at bedside. NS 500 ml bolus initiated. Type & screen done, awaiting blood to be ready. Repeat CBC drawn, also awaiting those results. Radiology notified of need for STAT Abd XR- will notify this RN when ready. Will also collect occult stool when able. Will continue to monitor very closely.

## 2019-04-22 NOTE — Progress Notes (Signed)
ANTICOAGULATION CONSULT NOTE - Initial Consult  Pharmacy Consult for Heparin when INR < 2 Indication: portal vein thrombosis  Allergies  Allergen Reactions  . Naltrexone Shortness Of Breath  . Rocephin [Ceftriaxone Sodium In Dextrose] Anaphylaxis  . Penicillins Rash    Has patient had a PCN reaction causing immediate rash, facial/tongue/throat swelling, SOB or lightheadedness with hypotension: Yes immediate rash  Has patient had a PCN reaction causing severe rash involving mucus membranes or skin necrosis: Yes Has patient had a PCN reaction that required hospitalization": No Has patient had a PCN reaction occurring within the last 10 years: Yes If all of the above answers are "NO", then may proceed with Cephalosporin use.     Patient Measurements: Height: 5\' 6"  (167.6 cm) Weight: 97 lb 8 oz (44.2 kg) IBW/kg (Calculated) : 59.3 Heparin Dosing Weight: 44.2 kg  Vital Signs: Temp: 97.5 F (36.4 C) (05/09 0922) Temp Source: Oral (05/09 0922) BP: 89/57 (05/09 0935) Pulse Rate: 98 (05/09 0935)  Labs: Recent Labs    04/20/19 0919 04/21/19 0504 04/22/19 0537  HGB 7.7* 8.3* 5.6*  HCT 24.9* 27.9* 18.6*  PLT 507* 657* 767*  LABPROT  --   --  22.9*  INR  --   --  2.1*  CREATININE <0.30* <0.30* 0.58    Estimated Creatinine Clearance: 45 mL/min (by C-G formula based on SCr of 0.58 mg/dL).   Medical History: Past Medical History:  Diagnosis Date  . Anxiety   . Current smoker   . Depression   . ETOH abuse   . GERD (gastroesophageal reflux disease)   . History of shingles summer 2015  . HTN (hypertension)   . Hyperlipidemia   . Hypertension   . Leg edema   . Neuropathy   . Neuropathy   . Pancreatitis    Assessment: 73 yo F w/ portal vein thrombosis and complicated course.  Heparin drip>>apixaban>>LMWH 50 q12>>apixaban 5/1>>DC 5/9.  Hg 8.5>>5.5. INR up to 2.1. Hematemesis per RN note.   Goal of Therapy:  Heparin level 0.3-0.7 units/ml Monitor platelets by  anticoagulation protocol: Yes   Plan:  DC Eliquis No heparin drip for now CCS rec PRBC, FFP and crystalloid, repeat Abd CT scan F/u plans  Herby Abraham, Pharm.D 04/22/2019 9:39 AM

## 2019-04-22 NOTE — Consult Note (Addendum)
PULMONARY / CRITICAL CARE MEDICINE   NAME:  April PoissonDeborah S Dubin, MRN:  [id number], DOB:  03/16/1946, LOS: 17 ADMISSION DATE:  03/23/2019, CONSULTATION DATE:  04/22/19 REFERRING MD:  [facility], CHIEF COMPLAINT:  shock  BRIEF HISTORY:     [id number] yowf originally admitted 4/22 with R Hip and abd pain and fever s/p LHip surgery 4/820 for fx femur with dx of UTI and acute pancreatitis c/b pseudocyst rx abx/ fluids with portal vein thrombosis as well so started on hep then doac with new problem with hypotension acute anemia am 5/9 and pccm consulted emergently for cvl placement and tx to ICU from 4E for expedient w/u.     SIGNIFICANT PAST MEDICAL HISTORY   Etohism  Smoker  GERD HBP  Pancreatitis    SIGNIFICANT EVENTS:  Shock am 5/9 > tx  To ICU  STUDIES:   CT abd 5/7: interval enlargement of a fluid collection abutting the tail of the pancreas and lesser curvatur  Findings concerning for abscess or other acute pancreatic fluid collection complicating acute pancreatitis.   Also L >R pl effusions   CULTURES:  BC 5/4 2/2   Pos Proteus pan sensitive and Enterobacter  BC 5/5 >>>   ANTIBIOTICS:  Azteronam 5/5 >>>  LINES/TUBES:  CVL L SCV  5/9 >>>   CONSULTANTS:  [facility]   SUBJECTIVE:  Nauseated, feels weak, denies pain on RA CONSTITUTIONAL: BP 123/72   Pulse 99   Temp 98 F (36.7 C) (Axillary)   Resp (!) 28   Ht 5\' 6"  (1.676 m)   Wt 44.2 kg   SpO2 99%   BMI 15.74 kg/m       Intake/Output Summary (Last 24 hours) at 04/22/2019 1312 Last data filed at 04/22/2019 1118 Gross per 24 hour  Intake 1387.85 ml  Output 650 ml  Net 737.85 ml          PHYSICAL EXAM: General:  Pale acute and chronically ill appearing elderly wf  Neuro:  Intermittently confused per nursing HEENT:  orophx clear  Cardiovascular:  RRR ST on monitor Lungs:  Distant bs s wheeze Abdomen:  Mild/mod distended and diffusely mildly tender s guarding or rebound Musculoskeletal:  Some muscle atrophy Skin:  No  skin breakdown   RESOLVED PROBLEM LIST   ASSESSMENT AND PLAN    Hypovolemic shock likely due to acute gi blood loss (or bleeding into pancreas vs also component of abd sepsis    Lab Results  Component Value Date   HGB 4.8 (LL) 04/22/2019   HGB 5.6 (LL) 04/22/2019   HGB 8.3 (L) 04/21/2019    - Rx per [facility]  - monitor cvp  - correct hgb to > 90% and hold further eliquis - continue azteonam/ consider adding primaxin empirically    L > R pleural effusions likely sympathetic / related to pancreatitis or low alb  - monitor for now and no increased wob   Portal vein thrombosis  - no choice for now but hold DOAC, may need to correct PT with ffp as well if there is further evidence of bleeding or inr drifting further out with   SUMMARY OF TODAY'S PLAN:  Transfer to ICU for fluids/ transfusion   Best Practice / Goals of Care / Disposition.   DVT PROPHYLAXIS:PAS SUP:ppi IV  NUTRITION: npo for now  MOBILITY:bedrest for now GOALS OF CARE:full code FAMILY DISCUSSIONS: none at bedside / [facility] updated 5/9 on current derioration DISPOSITION admit to ICUS  LABS  Glucose Recent Labs  Lab 04/21/19 0732 04/21/19 1146 04/21/19 1703 04/21/19 2107 04/22/19 0743 04/22/19 1128  GLUCAP 130* 143* 136* 157* 198* 161*    BMET Recent Labs  Lab 04/20/19 0919 04/21/19 0504 04/22/19 0537  NA 136 135 133*  K 3.1* 3.4* 4.3  CL 103 101 98  CO2 26 24 17*  BUN 14 11 16   CREATININE <0.30* <0.30* 0.58  GLUCOSE 155* 120* 208*    Liver Enzymes Recent Labs  Lab 04/20/19 0919 04/21/19 0504 04/22/19 0537  AST 29 24 28   ALT 22 21 16   ALKPHOS 136* 136* 108  BILITOT 0.4 1.0 0.7  ALBUMIN 2.0* 2.2* 1.9*    Electrolytes Recent Labs  Lab 04/16/19 0330  04/20/19 0919 04/21/19 0504 04/22/19 0537  CALCIUM 8.5*   < > 8.4* 8.4* 7.8*  MG 1.9  --  1.7  --   --    < > = values in this interval not displayed.    CBC Recent Labs  Lab 04/21/19 0504 04/22/19 0537 04/22/19 0744  WBC  13.9* 45.9* 53.3*  HGB 8.3* 5.6* 4.8*  HCT 27.9* 18.6* 14.8*  PLT 657* 767* 689*    ABG No results for input(s): PHART, PCO2ART, PO2ART in the last 168 hours.  Coag's Recent Labs  Lab 04/22/19 0537  INR 2.1*    Sepsis Markers No results for input(s): LATICACIDVEN, PROCALCITON, O2SATVEN in the last 168 hours.  Cardiac Enzymes No results for input(s): TROPONINI, PROBNP in the last 168 hours.  PAST MEDICAL HISTORY :   She  has a past medical history of Anxiety, Current smoker, Depression, ETOH abuse, GERD (gastroesophageal reflux disease), History of shingles (summer 2015), HTN (hypertension), Hyperlipidemia, Hypertension, Leg edema, Neuropathy, Neuropathy, and Pancreatitis.  PAST SURGICAL HISTORY:  She  has a past surgical history that includes removal of uterine fibroids (2004); ERCP (N/A, 02/01/2017); Laparoscopic cholecystectomy with common duct exploration and intraop cholangiogram (N/A, 02/03/2017); and Intramedullary (im) nail intertrochanteric (Left, 03/22/2019).  Allergies  Allergen Reactions  . Naltrexone Shortness Of Breath  . Rocephin [Ceftriaxone Sodium In Dextrose] Anaphylaxis  . Penicillins Rash    Has patient had a PCN reaction causing immediate rash, facial/tongue/throat swelling, SOB or lightheadedness with hypotension: Yes immediate rash  Has patient had a PCN reaction causing severe rash involving mucus membranes or skin necrosis: Yes Has patient had a PCN reaction that required hospitalization": No Has patient had a PCN reaction occurring within the last 10 years: Yes If all of the above answers are "NO", then may proceed with Cephalosporin use.     No current facility-administered medications on file prior to encounter.    Current Outpatient Medications on File Prior to Encounter  Medication Sig  . amitriptyline (ELAVIL) 25 MG tablet Take 25 mg by mouth at bedtime as needed for sleep.   Marland Kitchen aspirin EC 325 MG EC tablet Take 1 tablet (325 mg total) by mouth  daily with breakfast.  . calcium-vitamin D (OSCAL WITH D) 500-200 MG-UNIT tablet Take 1 tablet by mouth 2 (two) times daily.  . clonazePAM (KLONOPIN) 0.5 MG tablet Take 0.5 mg by mouth 2 (two) times daily as needed for anxiety. as directed  . folic acid (FOLVITE) 1 MG tablet Take 1 tablet (1 mg total) by mouth daily.  . magnesium oxide (MAG-OX) 400 (241.3 Mg) MG tablet Take 1 tablet (400 mg total) by mouth 2 (two) times daily.  . methocarbamol (ROBAXIN) 500 MG tablet Take 1 tablet (500 mg total) by mouth every 6 (six) hours as needed  for muscle spasms.  . ondansetron (ZOFRAN) 4 MG tablet Take 1 tablet (4 mg total) by mouth every 6 (six) hours as needed for nausea.  Marland Kitchen oxyCODONE-acetaminophen (PERCOCET/ROXICET) 5-325 MG tablet Take 1 tablet by mouth every 6 (six) hours as needed for severe pain.  . pantoprazole (PROTONIX) 40 MG tablet Take 1 tablet (40 mg total) by mouth daily for 15 days.  . promethazine (PHENERGAN) 25 MG tablet Take 25 mg by mouth every 6 (six) hours as needed. for nausea  . thiamine 100 MG tablet Take 1 tablet (100 mg total) by mouth daily.  . feeding supplement, ENSURE ENLIVE, (ENSURE ENLIVE) LIQD Take 237 mLs by mouth 2 (two) times daily between meals. (Patient not taking: Reported on 03/21/2019)  . Multiple Vitamin (MULTIVITAMIN WITH MINERALS) TABS tablet Take 1 tablet by mouth daily. (Patient not taking: Reported on 03/21/2019)  . polyethylene glycol (MIRALAX / GLYCOLAX) packet Take 17 g by mouth daily. (Patient not taking: Reported on 03/21/2019)    FAMILY HISTORY:   Her family history includes Heart disease in her father. There is no history of Neuropathy.  SOCIAL HISTORY:  She  reports that she has been smoking cigarettes. She has been smoking about 1.00 pack per day. She has never used smokeless tobacco. She reports current alcohol use of about 14.0 standard drinks of alcohol per week. She reports that she does not use drugs.      Total time devoted to counseling  > 50 %  of initial 60 min office visit:  review case with pt/ discussion of options/alternatives/ personally creating written customized instructions  in presence of pt  then going over those specific  Instructions directly with the pt including how to use all of the meds but in particular covering each new medication in detail and the difference between the maintenance= "automatic" meds and the prns using an action plan format for the latter (If this problem/symptom => do that organization reading Left to right).  Please see AVS from this visit for a full list of these instructions which I personally wrote for this pt and  are unique to this visit.    Sandrea Hughs, MD Pulmonary and Critical Care Medicine  [facility] [phone] After 5:30 PM or weekends, use [facility] [phone]

## 2019-04-22 NOTE — Progress Notes (Addendum)
Subjective: The patient was seen by my partner Dr. Matthias Hughs on 04/07/2019 for pancreatitis related to alcohol abuse, history of chronic pancreatitis with calcifications(prior choledocholithiasis in 01/2017, status post laparoscopic cholecystectomy and laparoscopic common duct exploration), nonocclusive portal vein thrombosis, started on Eliquis with plan to continue it for at least 3 months, treated for urosepsis, acute systolic heart failure, stress-induced cardiomyopathy, acute hypoxic respiratory failure, metabolic encephalopathy with acute hypercapnic respiratory failure, underwent a CAT scan with contrast on 04/20/2019 for abdominal pain, fever which showed interval enlargement of fluid collection abutting tail of pancreas and lesser curvature of stomach measuring 4.3 x 2.1 x 4.6 cm with pancreatic calcification, no biliary dilatation.  Findings concerning for abscess or other acute pancreatic fluid collection complicating acute pancreatitis along with small volume ascites and pleural effusion.  Overnight patient became hypotensive, had an episode of emesis with a spoonful amount of coffee-ground material and complained of worsening abdominal pain. So far has received 2 L normal saline bolus, 1 unit PRBC transfusion with another unit to be given soon. Central line placement being planned along with a repeat CAT scan. Patient dropped her hemoglobin from 8.3-5.6 today morning and is 4.8 currently. WBC count has increased from 13.9-45.9 and is 53.3 currently.   Objective: Vital signs in last 24 hours: Temp:  [97.4 F (36.3 C)-98.5 F (36.9 C)] 98.5 F (36.9 C) (05/09 1047) Pulse Rate:  [83-109] 101 (05/09 1047) Resp:  [15-36] 32 (05/09 1036) BP: (64-136)/(49-96) 97/55 (05/09 1047) SpO2:  [93 %-100 %] 98 % (05/09 1047) Weight:  [44.2 kg] 44.2 kg (05/09 0541) Weight change: 0.635 kg Last BM Date: 04/21/19  PE: Patient awake, follows commands and speaking in full sentences, not in  distress GENERAL: Appears very pale, not icteric ABDOMEN: Severely tender abdomen, bowel sounds not audible EXTREMITIES: Thin lower extremities, no edema  Lab Results: Results for orders placed or performed during the hospital encounter of 04/10/2019 (from the past 48 hour(s))  Glucose, capillary     Status: Abnormal   Collection Time: 04/20/19 11:44 AM  Result Value Ref Range   Glucose-Capillary 138 (H) 70 - 99 mg/dL  Glucose, capillary     Status: Abnormal   Collection Time: 04/20/19  4:43 PM  Result Value Ref Range   Glucose-Capillary 106 (H) 70 - 99 mg/dL  Glucose, capillary     Status: Abnormal   Collection Time: 04/20/19  9:26 PM  Result Value Ref Range   Glucose-Capillary 101 (H) 70 - 99 mg/dL   Comment 1 Notify RN   CBC with Differential/Platelet     Status: Abnormal   Collection Time: 04/21/19  5:04 AM  Result Value Ref Range   WBC 13.9 (H) 4.0 - 10.5 K/uL   RBC 2.58 (L) 3.87 - 5.11 MIL/uL   Hemoglobin 8.3 (L) 12.0 - 15.0 g/dL   HCT 16.1 (L) 09.6 - 04.5 %   MCV 108.1 (H) 80.0 - 100.0 fL   MCH 32.2 26.0 - 34.0 pg   MCHC 29.7 (L) 30.0 - 36.0 g/dL   RDW 40.9 81.1 - 91.4 %   Platelets 657 (H) 150 - 400 K/uL   nRBC 0.0 0.0 - 0.2 %   Neutrophils Relative % 86 %   Neutro Abs 11.9 (H) 1.7 - 7.7 K/uL   Lymphocytes Relative 7 %   Lymphs Abs 1.0 0.7 - 4.0 K/uL   Monocytes Relative 6 %   Monocytes Absolute 0.9 0.1 - 1.0 K/uL   Eosinophils Relative 0 %   Eosinophils Absolute  0.1 0.0 - 0.5 K/uL   Basophils Relative 0 %   Basophils Absolute 0.1 0.0 - 0.1 K/uL   Immature Granulocytes 1 %   Abs Immature Granulocytes 0.10 (H) 0.00 - 0.07 K/uL    Comment: Performed at [facility], [address], [place], [place] [address]  Comprehensive metabolic panel     Status: Abnormal   Collection Time: 04/21/19  5:04 AM  Result Value Ref Range   Sodium 135 135 - 145 mmol/L   Potassium 3.4 (L) 3.5 - 5.1 mmol/L   Chloride 101 98 - 111 mmol/L   CO2 24 22 - 32 mmol/L    Glucose, Bld 120 (H) 70 - 99 mg/dL   BUN 11 8 - 23 mg/dL   Creatinine, Ser <6.04 (L) 0.44 - 1.00 mg/dL   Calcium 8.4 (L) 8.9 - 10.3 mg/dL   Total Protein 5.7 (L) 6.5 - 8.1 g/dL   Albumin 2.2 (L) 3.5 - 5.0 g/dL   AST 24 15 - 41 U/L   ALT 21 0 - 44 U/L   Alkaline Phosphatase 136 (H) 38 - 126 U/L   Total Bilirubin 1.0 0.3 - 1.2 mg/dL   GFR calc non Af Amer NOT CALCULATED >60 mL/min   GFR calc Af Amer NOT CALCULATED >60 mL/min   Anion gap 10 5 - 15    Comment: Performed at [facility], [address], [place], [place] [address]  Lipase, blood     Status: Abnormal   Collection Time: 04/21/19  5:04 AM  Result Value Ref Range   Lipase 203 (H) 11 - 51 U/L    Comment: Performed at [facility], [address] [place], [place] [address]  Glucose, capillary     Status: Abnormal   Collection Time: 04/21/19  7:32 AM  Result Value Ref Range   Glucose-Capillary 130 (H) 70 - 99 mg/dL  Glucose, capillary     Status: Abnormal   Collection Time: 04/21/19 11:46 AM  Result Value Ref Range   Glucose-Capillary 143 (H) 70 - 99 mg/dL  Glucose, capillary     Status: Abnormal   Collection Time: 04/21/19  5:03 PM  Result Value Ref Range   Glucose-Capillary 136 (H) 70 - 99 mg/dL  Glucose, capillary     Status: Abnormal   Collection Time: 04/21/19  9:07 PM  Result Value Ref Range   Glucose-Capillary 157 (H) 70 - 99 mg/dL  Comprehensive metabolic panel     Status: Abnormal   Collection Time: 04/22/19  5:37 AM  Result Value Ref Range   Sodium 133 (L) 135 - 145 mmol/L   Potassium 4.3 3.5 - 5.1 mmol/L    Comment: DELTA CHECK NOTED NO VISIBLE HEMOLYSIS    Chloride 98 98 - 111 mmol/L   CO2 17 (L) 22 - 32 mmol/L   Glucose, Bld 208 (H) 70 - 99 mg/dL   BUN 16 8 - 23 mg/dL   Creatinine, Ser 7.82 0.44 - 1.00 mg/dL   Calcium 7.8 (L) 8.9 - 10.3 mg/dL   Total Protein 5.1 (L) 6.5 - 8.1 g/dL   Albumin 1.9 (L) 3.5 - 5.0 g/dL   AST 28 15 - 41 U/L   ALT 16 0 - 44 U/L    Alkaline Phosphatase 108 38 - 126 U/L   Total Bilirubin 0.7 0.3 - 1.2 mg/dL   GFR calc non Af Amer >60 >60 mL/min   GFR calc Af Amer >60 >60 mL/min   Anion gap  18 (H) 5 - 15    Comment: Performed at [facility], [address] [place], [place] [address]  CBC with Differential/Platelet     Status: Abnormal   Collection Time: 04/22/19  5:37 AM  Result Value Ref Range   WBC 45.9 (H) 4.0 - 10.5 K/uL    Comment: REPEATED TO VERIFY WHITE COUNT CONFIRMED ON SMEAR    RBC 1.68 (L) 3.87 - 5.11 MIL/uL   Hemoglobin 5.6 (LL) 12.0 - 15.0 g/dL    Comment: REPEATED TO VERIFY THIS CRITICAL RESULT HAS VERIFIED AND BEEN CALLED TO R,DICKS BY AISHA MOHAMED ON 05 09 2020 AT 0634, AND HAS BEEN READ BACK.     HCT 18.6 (L) 36.0 - 46.0 %   MCV 110.7 (H) 80.0 - 100.0 fL   MCH 33.3 26.0 - 34.0 pg   MCHC 30.1 30.0 - 36.0 g/dL   RDW 21.315.6 (H) 08.611.5 - 57.815.5 %   Platelets 767 (H) 150 - 400 K/uL   nRBC 0.0 0.0 - 0.2 %   Neutrophils Relative % 86 %   Neutro Abs 39.4 (H) 1.7 - 7.7 K/uL   Lymphocytes Relative 4 %   Lymphs Abs 1.7 0.7 - 4.0 K/uL   Monocytes Relative 5 %   Monocytes Absolute 2.3 (H) 0.1 - 1.0 K/uL   Eosinophils Relative 0 %   Eosinophils Absolute 0.0 0.0 - 0.5 K/uL   Basophils Relative 0 %   Basophils Absolute 0.1 0.0 - 0.1 K/uL   WBC Morphology MILD LEFT SHIFT (1-5% METAS, OCC MYELO, OCC BANDS)    Immature Granulocytes 5 %   Abs Immature Granulocytes 2.39 (H) 0.00 - 0.07 K/uL    Comment: Performed at [facility], [address] [place], [place] [address]  Protime-INR     Status: Abnormal   Collection Time: 04/22/19  5:37 AM  Result Value Ref Range   Prothrombin Time 22.9 (H) 11.4 - 15.2 seconds   INR 2.1 (H) 0.8 - 1.2    Comment: (NOTE) INR goal varies based on device and disease states. Performed at [facility], [address] [place], [place] [address]   Prealbumin     Status: Abnormal   Collection Time: 04/22/19  5:37 AM   Result Value Ref Range   Prealbumin 6.6 (L) 18 - 38 mg/dL    Comment: Performed at [facility], [address] [place], [place] [address]  Prepare RBC     Status: None   Collection Time: 04/22/19  7:08 AM  Result Value Ref Range   Order Confirmation      ORDER PROCESSED BY BLOOD BANK Performed at [facility], [address] [place], [place] [address]   Type and screen [facility]     Status: None (Preliminary result)   Collection Time: 04/22/19  7:10 AM  Result Value Ref Range   ABO/RH(D) O POS    Antibody Screen NEG    Sample Expiration 04/25/2019,2359    Unit Number [id number]    Blood Component Type RED CELLS,LR    Unit division 00    Status of Unit ISSUED    Transfusion Status OK TO TRANSFUSE    Crossmatch Result      Compatible Performed at [facility], [address] [place], [place] [address]    Unit Number [id number]    Blood Component Type RED CELLS,LR    Unit division 00    Status of Unit  ISSUED    Transfusion Status OK TO TRANSFUSE    Crossmatch Result Compatible   Glucose, capillary     Status: Abnormal   Collection Time: 04/22/19  7:43 AM  Result Value Ref Range   Glucose-Capillary 198 (H) 70 - 99 mg/dL  CBC with Differential/Platelet     Status: Abnormal   Collection Time: 04/22/19  7:44 AM  Result Value Ref Range   WBC 53.3 (HH) 4.0 - 10.5 K/uL    Comment: This critical result has verified and been called to [facility] by Zetta Bills on 05 09 2020 at 0944, and has been read back. CRITICAL VERIFIED   RBC 1.41 (L) 3.87 - 5.11 MIL/uL   Hemoglobin 4.8 (LL) 12.0 - 15.0 g/dL    Comment: This critical result has verified and been called to [facility] by Zetta Bills on 05 09 2020 at 0944, and has been read back. CRITICAL VERIFIED   HCT 14.8 (L) 36.0 - 46.0 %   MCV 105.0 (H) 80.0 - 100.0 fL   MCH 34.0 26.0 - 34.0 pg   MCHC 32.4 30.0 - 36.0 g/dL   RDW 73.5 (H) 67.0 - 14.1 %    Platelets 689 (H) 150 - 400 K/uL   nRBC 0.0 0.0 - 0.2 %   Neutrophils Relative % 89 %   Neutro Abs 46.8 (H) 1.7 - 7.7 K/uL   Lymphocytes Relative 3 %   Lymphs Abs 1.6 0.7 - 4.0 K/uL   Monocytes Relative 4 %   Monocytes Absolute 2.3 (H) 0.1 - 1.0 K/uL   Eosinophils Relative 0 %   Eosinophils Absolute 0.1 0.0 - 0.5 K/uL   Basophils Relative 0 %   Basophils Absolute 0.1 0.0 - 0.1 K/uL   WBC Morphology INCREASED BANDS (>20% BANDS)     Comment: MODERATE LEFT SHIFT (>5% METAS AND MYELOS,OCC PRO NOTED)   Immature Granulocytes 4 %   Abs Immature Granulocytes 2.35 (H) 0.00 - 0.07 K/uL   Polychromasia PRESENT     Comment: Performed at [facility], [address], [place], [place] [address]    Studies/Results: Dg Chest 2 View  Result Date: 04/20/2019 CLINICAL DATA:  Shortness of breath EXAM: CHEST - 2 VIEW COMPARISON:  04/17/2019 FINDINGS: Cardiac silhouette is unchanged. Emphysematous changes are noted bilaterally. There is a small left-sided pleural effusion. There is a persistent retrocardiac opacity favored to represent compressive atelectasis. Pleuroparenchymal scarring is noted at the lung apices. There is osteopenia. IMPRESSION: 1. Persistent small left-sided pleural effusion, slightly increased from prior study. 2. Slight interval worsening of a retrocardiac opacity which is favored to represent atelectasis. 3. Emphysematous changes. Electronically Signed   By: Katherine Mantle M.D.   On: 04/20/2019 16:43   Ct Abdomen Pelvis W Contrast  Result Date: 04/20/2019 CLINICAL DATA:  Acute pancreatitis, abdominal pain, fever, abscess suspected EXAM: CT ABDOMEN AND PELVIS WITH CONTRAST TECHNIQUE: Multidetector CT imaging of the abdomen and pelvis was performed using the standard protocol following bolus administration of intravenous contrast. CONTRAST:  85mL OMNIPAQUE IOHEXOL 300 MG/ML  SOLN COMPARISON:  CT abdomen pelvis, 03/24/2019 FINDINGS: Lower chest: There are new small,  left greater than right bilateral pleural effusions with associated atelectasis or consolidation. Coronary artery calcifications. Hepatobiliary: Hepatomegaly, maximum coronal span 22 cm. No focal liver abnormality is seen. Status post cholecystectomy. No biliary dilatation. Pancreas: Redemonstrated inflammatory stranding and fluid about the body and tail of the pancreas. There has been interval enlargement of a fluid collection abutting the tail of the pancreas  and lesser curvature of stomach, measuring approximately 4.3 x 2.1 x 4.6 cm (series 2, image 26, series 5, image 32). Pancreatic parenchymal calcifications. Spleen: Normal in size without focal abnormality. Adrenals/Urinary Tract: Adrenal glands are unremarkable. Kidneys are normal, without renal calculi, focal lesion, or hydronephrosis. Distended urinary bladder. Stomach/Bowel: Stomach is within normal limits. No evidence of bowel wall thickening, distention, or inflammatory changes. Sigmoid diverticulosis. Vascular/Lymphatic: Severe mixed calcific atherosclerosis. No enlarged abdominal or pelvic lymph nodes. Reproductive: Calcified uterine fibroids. Other: No abdominal wall hernia or abnormality. Small volume 4 quadrant ascites. Musculoskeletal: No acute or significant osseous findings. IMPRESSION: 1. Redemonstrated inflammatory stranding and fluid about the body and tail of the pancreas. There has been interval enlargement of a fluid collection abutting the tail of the pancreas and lesser curvature of stomach, measuring approximately 4.3 x 2.1 x 4.6 cm (series 2, image 26, series 5, image 32). Findings concerning for abscess or other acute pancreatic fluid collection complicating acute pancreatitis. 2.  Small volume ascites and pleural effusions. 3.  Distended urinary bladder.  Correlate for urinary retention. 4.  Other chronic and incidental findings as detailed above. Electronically Signed   By: Lauralyn Primes M.D.   On: 04/20/2019 16:43   Dg Abd 2  Views  Result Date: 04/22/2019 CLINICAL DATA:  Acute right-sided abdominal pain. EXAM: ABDOMEN - 2 VIEW COMPARISON:  CT scan of Apr 20, 2019. Radiographs of October 18, 2018. FINDINGS: Mildly dilated small bowel loops are noted in the left lower quadrant which may represent ileus or possibly obstruction. No free air is noted to suggest pneumoperitoneum. Atherosclerosis of abdominal aorta is noted. Status post cholecystectomy. No significant colonic dilatation is noted. IMPRESSION: Mildly dilated small bowel loops are noted in the left lower quadrant of the abdomen which may represent ileus or possibly obstruction. Aortic Atherosclerosis (ICD10-I70.0). Electronically Signed   By: Lupita Raider M.D.   On: 04/22/2019 08:41    Medications: I have reviewed the patient's current medications.  Assessment: Pancreatic pseudocyst, acute on chronic pancreatitis, acute drop in hemoglobin and hypotension, likely related to hemorrhagic conversion of pseudocyst or necrosis of pancreas.  Marked leukocytosis with hypotension could be related to abscess formation or infected pseudocyst  Elevated PT/INR of 22.9/2.1, likely related to sepsis, last dose of Eliquis for portal vein thrombosis was given yesterday  Proteus mirabilis noted in blood culture from 04/17/2019 Enterobacteriaceae noted in blood culture from 04/17/2019  Plan: Patient is going to be transferred to ICU Will receive second unit PRBC transfusion Repeat imaging ordered as patient likely has infected pancreatic pseudocyst with possible hemorrhagic conversion.  Pseudoaneurysm formation needs to be ruled out(as patient has possible unexplained gastrointestinal bleeding, sudden expansion of a pancreatic fluid collection, was 2.1 cm adjacent to pancreatic tail and posterior fundus of stomach on 04-08-2019, has increased in size to 4.3 X 2.1 X 4.6 cm along tail of pancreas and lesser curvature of stomach and has an unexplained drop in hemoglobin and  hematocrit)   Patient has been on IV azactam 2 g every 8 hours  Continue supportive management for now.  Patient's prognosis remains extremely guarded, discussed the same with patient's hospitalist Dr. Mahala Menghini.  Pancreatic pseudocyst would be amenable for endoscopic drainage only if fluid collection is mature with a well-defined wall around it, if the wall of the fluid collection is adherent to the stomach/duodenum and generally the fluid collection needs to be at least 6 cm in size.Also, we need to ensure that pseudoaneurysm has been ruled out.  Kerin Salen 04/22/2019, 10:48 AM   Pager [phone] If no answer or after 5 PM call [phone]

## 2019-04-22 NOTE — Progress Notes (Addendum)
Report called to RN [facility] in ICU. Pt in CT at this time with ICU RN Huntley Dec. MD Samtani aware Cardizem gtt has not been initiated- per MD this is to be used if needed if pt converts to A-Fib. Pt has been ST this shift. Second unit of blood ended at 1118.

## 2019-04-22 NOTE — Progress Notes (Signed)
Consult placed to IV Team for more access, also, pt needing CT scan;  Attempted x 1 in R upper arm to place midline; great bld return; unable to thread guidewire ; was assisted by staff RN also;  No other suitable veins in the right or left arm with ultrasound;  MDs at bedside at present, planning to place central access.

## 2019-04-22 NOTE — Progress Notes (Signed)
Patient ID: April [name], female   DOB: 11-17-1946, 73 y.o.   MRN: [id number] Request received for consideration of image guided pancreatic fluid collection drainage in patient.  Recent imaging studies were reviewed by Dr. Miles Costain (pager [phone]).  The above-noted fluid collection is not amenable to percutaneous drainage.  Consider GI consult for possible cyst gastrostomy or percutaneous aspiration if needed.

## 2019-04-22 NOTE — Progress Notes (Signed)
eLink Physician-Brief Progress Note Patient Name: April [name] DOB: 1946/11/03 MRN: [id number]   Date of Service  04/22/2019  HPI/Events of Note  Hgb drop from 8.3 to 6.0.  Now s/p IR procedure to embolize gastric artery  eICU Interventions  Plan: Transfuse 1 unit pRBC Post-transfusion CBC     Intervention Category Intermediate Interventions: Bleeding - evaluation and treatment with blood products  DETERDING,ELIZABETH 04/22/2019, 11:13 PM

## 2019-04-22 NOTE — Progress Notes (Signed)
Improved hemodynamically p 2 units tx but still disoriented   CVP:  [10 mmHg] 10 mmHg    Discussed results of abd cbc with pt, daughter, GI and IR   High likelihood she will bleed again so Discussed in detail all the  indications, usual  risks and alternatives  relative to the benefits with patient's daughter Karen Kitchens  who agrees to proceed with  IR angiogram  This afternoon.   Repeat cbc pending, will ask BB to stay 2 units ahead.   Sandrea Hughs, MD Pulmonary and Critical Care Medicine [facility] Cell [phone] After 5:30 PM or weekends, use Beeper [phone]

## 2019-04-22 NOTE — Progress Notes (Signed)
Linton Flemings notified for pt's BP 65/49 with map 49.  Awaiting any new orders.

## 2019-04-22 NOTE — Sedation Documentation (Signed)
5 Fr. Exoseal to right groin 

## 2019-04-22 NOTE — Progress Notes (Signed)
CRITICAL VALUE ALERT  Critical Value:  Hgb 5.6  Date & Time Notied:  04/22/2019 0640   Provider Notified: X. Blount  Orders Received/Actions taken: awaiting any new orders

## 2019-04-22 NOTE — Progress Notes (Signed)
MEWS/VS Documentation      04/21/2019 1900 04/21/2019 2000 04/21/2019 2200 04/22/2019 0541   MEWS Score:  0  0  0  3   MEWS Score Color:  Green  Green  Green  Yellow   Resp:  -  -  -  15   Pulse:  -  -  86  83   BP:  -  -  (!) 136/96  (!) 65/49   Temp:  -  -  98 F (36.7 C)  97.9 F (36.6 C)   O2 Device:  -  -  Room Air  Room Air   Level of Consciousness:  -  Alert  -  -

## 2019-04-22 NOTE — Progress Notes (Addendum)
I was informed this morning of patient having a low blood pressure Notes do not indicate any emesis overnight however verbal report from morning nurse seems to indicate that patient had one episode of dark blood emesis at around 2 AM and received perhaps Ativan after the same  On exam the patient is diaphoretic pale and feels nauseous laying flat Her abdomen is slightly distended compared to my prior examination of her She is tender throughout and I cannot hear bowel sounds No BM x 2 d, no flatus   Assessment/plan ?  Perforated viscus ?  Pancreatic pseudocyst with worsening sepsis/extension  Hemoglobin is low at 6, white count is 45 which is a big change from yesterday Repeat CBC stat Stat 2 view films of abdomen pelvis Stat general surgery consult We will need to discuss holistically with family prior to any type of intervention however as patient has poor protoplasm and probably would not do well with any type of surgery  Direct patient time 90 minutes   Addend have spoken with IR, critical care, gastroenterology and patient will be going to the ICU I have also spoken with daughter who understands the grave and serious nature of mother's illness and if no significant improvement we may have to have a different type of discussion Dr. Neale Burly of palliative care is aware of this when I saw him rounding on other patients this morning and he is available if needed over the weekend to help consolidate goals  Pleas Koch, MD [facility] 7:33 AM

## 2019-04-22 NOTE — Procedures (Signed)
Emergent conditions:  Extremely limited acess in pt with critical acute blood anemia and circulatory shock   Discussed in detail all the  indications, usual  risks and alternatives  relative to the benefits with patient who agrees to proceed with  cvl as outlined.     Supine position/ sterile prep and full bed drape  1% lidocaine/ access L Avella Vein 1st stick/ no air   #18 gauge triple lumen to 18 cm / good blood return/ sutured in place and sterile dressing applied  Pt tolerated well / f/u cxr pending   Plan transfter to ICU for further volume challenges/ transfusion  Will transfer also to PCCM service as discussed with Dr Weston Settle, MD Pulmonary and Critical Care Medicine [facility] Cell [phone] After 5:30 PM or weekends, use Beeper [phone]

## 2019-04-22 NOTE — Progress Notes (Signed)
Received call from X. Blount states she is ordering NS 250 ml bolus over 1 hour.  States pt has h/o heart failure so monitor her closely.  Informed X. Blount at this time that pt had also had an episode of emesis that was small in size and brownish/red in color.

## 2019-04-23 DIAGNOSIS — L8990 Pressure ulcer of unspecified site, unspecified stage: Secondary | ICD-10-CM

## 2019-04-23 LAB — BASIC METABOLIC PANEL
Anion gap: 10 (ref 5–15)
Anion gap: 9 (ref 5–15)
BUN: 21 mg/dL (ref 8–23)
BUN: 19 mg/dL (ref 8–23)
CO2: 24 mmol/L (ref 22–32)
CO2: 23 mmol/L (ref 22–32)
Calcium: 7.6 mg/dL — ABNORMAL LOW (ref 8.9–10.3)
Calcium: 7.6 mg/dL — ABNORMAL LOW (ref 8.9–10.3)
Chloride: 105 mmol/L (ref 98–111)
Chloride: 108 mmol/L (ref 98–111)
Creatinine, Ser: 0.46 mg/dL (ref 0.44–1.00)
Creatinine, Ser: 0.37 mg/dL — ABNORMAL LOW (ref 0.44–1.00)
GFR calc Af Amer: >60 mL/min (ref >60)
GFR calc Af Amer: >60 mL/min (ref >60)
GFR calc non Af Amer: >60 mL/min (ref >60)
GFR calc non Af Amer: >60 mL/min (ref >60)
Glucose, Bld: 159 mg/dL — ABNORMAL HIGH (ref 70–99)
Glucose, Bld: 105 mg/dL — ABNORMAL HIGH (ref 70–99)
Potassium: 3.2 mmol/L — ABNORMAL LOW (ref 3.5–5.1)
Potassium: 3.3 mmol/L — ABNORMAL LOW (ref 3.5–5.1)
Sodium: 139 mmol/L (ref 135–145)
Sodium: 140 mmol/L (ref 135–145)

## 2019-04-23 LAB — COMPREHENSIVE METABOLIC PANEL
ALT: 19 U/L (ref 0–44)
AST: 17 U/L (ref 15–41)
Albumin: 2.3 g/dL — ABNORMAL LOW (ref 3.5–5.0)
Alkaline Phosphatase: 81 U/L (ref 38–126)
Anion gap: 10 (ref 5–15)
BUN: 21 mg/dL (ref 8–23)
CO2: 23 mmol/L (ref 22–32)
Calcium: 7.6 mg/dL — ABNORMAL LOW (ref 8.9–10.3)
Chloride: 106 mmol/L (ref 98–111)
Creatinine, Ser: 0.49 mg/dL (ref 0.44–1.00)
GFR calc Af Amer: >60 mL/min (ref >60)
GFR calc non Af Amer: >60 mL/min (ref >60)
Glucose, Bld: 186 mg/dL — ABNORMAL HIGH (ref 70–99)
Potassium: 3.1 mmol/L — ABNORMAL LOW (ref 3.5–5.1)
Sodium: 139 mmol/L (ref 135–145)
Total Bilirubin: 0.8 mg/dL (ref 0.3–1.2)
Total Protein: 5 g/dL — ABNORMAL LOW (ref 6.5–8.1)

## 2019-04-23 LAB — CBC WITH DIFFERENTIAL/PLATELET
Abs Immature Granulocytes: 0.44 10*3/uL — ABNORMAL HIGH (ref 0.00–0.07)
Abs Immature Granulocytes: 0.47 10*3/uL — ABNORMAL HIGH (ref 0.00–0.07)
Abs Immature Granulocytes: 0.61 10*3/uL — ABNORMAL HIGH (ref 0.00–0.07)
Basophils Absolute: 0.1 10*3/uL (ref 0.0–0.1)
Basophils Absolute: 0.1 10*3/uL (ref 0.0–0.1)
Basophils Absolute: 0 10*3/uL (ref 0.0–0.1)
Basophils Relative: 0 %
Basophils Relative: 0 %
Basophils Relative: 0 %
Eosinophils Absolute: 0 10*3/uL (ref 0.0–0.5)
Eosinophils Absolute: 0 10*3/uL (ref 0.0–0.5)
Eosinophils Absolute: 0.1 10*3/uL (ref 0.0–0.5)
Eosinophils Relative: 0 %
Eosinophils Relative: 0 %
Eosinophils Relative: 0 %
HCT: 24.4 % — ABNORMAL LOW (ref 36.0–46.0)
HCT: 22.6 % — ABNORMAL LOW (ref 36.0–46.0)
HCT: 25 % — ABNORMAL LOW (ref 36.0–46.0)
Hemoglobin: 8.2 g/dL — ABNORMAL LOW (ref 12.0–15.0)
Hemoglobin: 7.9 g/dL — ABNORMAL LOW (ref 12.0–15.0)
Hemoglobin: 8.3 g/dL — ABNORMAL LOW (ref 12.0–15.0)
Immature Granulocytes: 2 %
Immature Granulocytes: 2 %
Immature Granulocytes: 3 %
Lymphocytes Relative: 5 %
Lymphocytes Relative: 6 %
Lymphocytes Relative: 8 %
Lymphs Abs: 1.3 10*3/uL (ref 0.7–4.0)
Lymphs Abs: 1.5 10*3/uL (ref 0.7–4.0)
Lymphs Abs: 1.6 10*3/uL (ref 0.7–4.0)
MCH: 32.3 pg (ref 26.0–34.0)
MCH: 32.9 pg (ref 26.0–34.0)
MCH: 31.7 pg (ref 26.0–34.0)
MCHC: 33.6 g/dL (ref 30.0–36.0)
MCHC: 35 g/dL (ref 30.0–36.0)
MCHC: 33.2 g/dL (ref 30.0–36.0)
MCV: 96.1 fL (ref 80.0–100.0)
MCV: 94.2 fL (ref 80.0–100.0)
MCV: 95.4 fL (ref 80.0–100.0)
Monocytes Absolute: 1.7 10*3/uL — ABNORMAL HIGH (ref 0.1–1.0)
Monocytes Absolute: 1.7 10*3/uL — ABNORMAL HIGH (ref 0.1–1.0)
Monocytes Absolute: 1.5 10*3/uL — ABNORMAL HIGH (ref 0.1–1.0)
Monocytes Relative: 6 %
Monocytes Relative: 6 %
Monocytes Relative: 7 %
Neutro Abs: 23.9 10*3/uL — ABNORMAL HIGH (ref 1.7–7.7)
Neutro Abs: 22.2 10*3/uL — ABNORMAL HIGH (ref 1.7–7.7)
Neutro Abs: 16.8 10*3/uL — ABNORMAL HIGH (ref 1.7–7.7)
Neutrophils Relative %: 87 %
Neutrophils Relative %: 86 %
Neutrophils Relative %: 82 %
Platelets: 484 10*3/uL — ABNORMAL HIGH (ref 150–400)
Platelets: 434 10*3/uL — ABNORMAL HIGH (ref 150–400)
Platelets: 405 10*3/uL — ABNORMAL HIGH (ref 150–400)
RBC: 2.54 MIL/uL — ABNORMAL LOW (ref 3.87–5.11)
RBC: 2.4 MIL/uL — ABNORMAL LOW (ref 3.87–5.11)
RBC: 2.62 MIL/uL — ABNORMAL LOW (ref 3.87–5.11)
RDW: 16.3 % — ABNORMAL HIGH (ref 11.5–15.5)
RDW: 15.9 % — ABNORMAL HIGH (ref 11.5–15.5)
RDW: 16.1 % — ABNORMAL HIGH (ref 11.5–15.5)
WBC: 27.5 10*3/uL — ABNORMAL HIGH (ref 4.0–10.5)
WBC: 25.9 10*3/uL — ABNORMAL HIGH (ref 4.0–10.5)
WBC: 20.6 10*3/uL — ABNORMAL HIGH (ref 4.0–10.5)
nRBC: 0 % (ref 0.0–0.2)
nRBC: 0 % (ref 0.0–0.2)
nRBC: 0 % (ref 0.0–0.2)

## 2019-04-23 LAB — GLUCOSE, CAPILLARY
Glucose-Capillary: 105 mg/dL — ABNORMAL HIGH (ref 70–99)
Glucose-Capillary: 117 mg/dL — ABNORMAL HIGH (ref 70–99)
Glucose-Capillary: 127 mg/dL — ABNORMAL HIGH (ref 70–99)
Glucose-Capillary: 132 mg/dL — ABNORMAL HIGH (ref 70–99)
Glucose-Capillary: 156 mg/dL — ABNORMAL HIGH (ref 70–99)
Glucose-Capillary: 156 mg/dL — ABNORMAL HIGH (ref 70–99)
Glucose-Capillary: 188 mg/dL — ABNORMAL HIGH (ref 70–99)

## 2019-04-23 LAB — BLOOD CULTURE ID PANEL (REFLEXED)

## 2019-04-23 LAB — PREPARE RBC (CROSSMATCH)

## 2019-04-23 LAB — HEMOGLOBIN AND HEMATOCRIT, BLOOD
HCT: 21.3 % — ABNORMAL LOW (ref 36.0–46.0)
HCT: 20.6 % — ABNORMAL LOW (ref 36.0–46.0)
Hemoglobin: 7.3 g/dL — ABNORMAL LOW (ref 12.0–15.0)
Hemoglobin: 7.1 g/dL — ABNORMAL LOW (ref 12.0–15.0)

## 2019-04-23 LAB — MAGNESIUM: Magnesium: 1.5 mg/dL — ABNORMAL LOW (ref 1.7–2.4)

## 2019-04-23 LAB — PROTIME-INR
INR: 1.4 — ABNORMAL HIGH (ref 0.8–1.2)
Prothrombin Time: 17.4 seconds — ABNORMAL HIGH (ref 11.4–15.2)

## 2019-04-23 MED ORDER — SODIUM CHLORIDE 0.9% FLUSH: 10.0000 mL | INTRAVENOUS | Status: DC | PRN

## 2019-04-23 MED ORDER — FENTANYL CITRATE (PF) 100 MCG/2ML IJ SOLN
25.0000 ug | INTRAMUSCULAR | Status: DC | PRN
  Administered 2019-04-23 – 2019-04-24 (×3): 25 ug via INTRAVENOUS
  Filled 2019-04-23 (×3): qty 2

## 2019-04-23 MED ORDER — SODIUM CHLORIDE 0.9% FLUSH
10.0000 mL | Freq: Two times a day (BID) | INTRAVENOUS | Status: DC
  Administered 2019-04-23 – 2019-04-24 (×2): 10 mL

## 2019-04-23 MED ORDER — POTASSIUM CHLORIDE 10 MEQ/50ML IV SOLN
10.0000 meq | INTRAVENOUS | Status: AC
  Administered 2019-04-23 (×4): 10 meq via INTRAVENOUS
  Filled 2019-04-23 (×4): qty 50

## 2019-04-23 MED ORDER — CHLORHEXIDINE GLUCONATE CLOTH 2 % EX PADS: 6.0000 | MEDICATED_PAD | Freq: Every day | CUTANEOUS | Status: DC

## 2019-04-23 MED ORDER — POTASSIUM CHLORIDE 10 MEQ/50ML IV SOLN: 10.0000 meq | INTRAVENOUS | Status: DC

## 2019-04-23 MED ORDER — KCL IN DEXTROSE-NACL 40-5-0.9 MEQ/L-%-% IV SOLN
INTRAVENOUS | Status: DC
  Administered 2019-04-23: 20:00:00 via INTRAVENOUS
  Filled 2019-04-23 (×3): qty 1000

## 2019-04-23 MED ORDER — POTASSIUM CHLORIDE 2 MEQ/ML IV SOLN: INTRAVENOUS | Status: DC

## 2019-04-23 MED ORDER — VITAMIN K1 10 MG/ML IJ SOLN
5.0000 mg | Freq: Once | INTRAVENOUS | Status: AC
  Administered 2019-04-23: 5 mg via INTRAVENOUS
  Filled 2019-04-23: qty 0.5

## 2019-04-23 MED ORDER — FENTANYL CITRATE (PF) 100 MCG/2ML IJ SOLN
50.0000 ug | INTRAMUSCULAR | Status: DC | PRN
  Administered 2019-04-23: 50 ug via INTRAVENOUS
  Filled 2019-04-23: qty 2

## 2019-04-23 MED ORDER — SODIUM CHLORIDE 0.9% IV SOLUTION: Freq: Once | INTRAVENOUS | Status: DC

## 2019-04-23 MED ORDER — SODIUM CHLORIDE 0.9 % IV SOLN
2.0000 g | Freq: Once | INTRAVENOUS | Status: AC
  Administered 2019-04-23: 2 g via INTRAVENOUS
  Filled 2019-04-23: qty 4

## 2019-04-23 MED ORDER — FUROSEMIDE 10 MG/ML IJ SOLN
20.0000 mg | Freq: Once | INTRAMUSCULAR | Status: AC
  Administered 2019-04-23: 20 mg via INTRAVENOUS
  Filled 2019-04-23: qty 2

## 2019-04-23 NOTE — Progress Notes (Signed)
Subjective: The patient was seen and examined at bedside. She reports having a bowel movement yesterday postprocedure, discussed with her nurse at bedside which does not seem to be true.  Seems to be confused intermittently.  She states that abdominal pain has improved.  Objective: Vital signs in last 24 hours: Temp:  [97.4 F (36.3 C)-99.1 F (37.3 C)] 98.2 F (36.8 C) (05/10 0800) Pulse Rate:  [97-129] 108 (05/10 0700) Resp:  [20-47] 31 (05/10 0700) BP: (81-141)/(48-92) 125/66 (05/10 0700) SpO2:  [94 %-100 %] 98 % (05/10 0700) Weight:  [46.6 kg] 46.6 kg (05/10 0500) Weight change: 2.374 kg Last BM Date: 04/21/19  PE: Awake, follows commands, answers appropriately, not in distress GENERAL: Thinly built, frail appearing, mild pallor ABDOMEN: Slightly distended, normal active bowel sounds, tender to touch especially in the left upper quadrant EXTREMITIES: No edema  Lab Results: Results for orders placed or performed during the hospital encounter of 04/08/19 (from the past 48 hour(s))  Glucose, capillary     Status: Abnormal   Collection Time: 04/21/19 11:46 AM  Result Value Ref Range   Glucose-Capillary 143 (H) 70 - 99 mg/dL  Glucose, capillary     Status: Abnormal   Collection Time: 04/21/19  5:03 PM  Result Value Ref Range   Glucose-Capillary 136 (H) 70 - 99 mg/dL  Glucose, capillary     Status: Abnormal   Collection Time: 04/21/19  9:07 PM  Result Value Ref Range   Glucose-Capillary 157 (H) 70 - 99 mg/dL  Comprehensive metabolic panel     Status: Abnormal   Collection Time: 04/22/19  5:37 AM  Result Value Ref Range   Sodium 133 (L) 135 - 145 mmol/L   Potassium 4.3 3.5 - 5.1 mmol/L    Comment: DELTA CHECK NOTED NO VISIBLE HEMOLYSIS    Chloride 98 98 - 111 mmol/L   CO2 17 (L) 22 - 32 mmol/L   Glucose, Bld 208 (H) 70 - 99 mg/dL   BUN 16 8 - 23 mg/dL   Creatinine, Ser 1.24 0.44 - 1.00 mg/dL   Calcium 7.8 (L) 8.9 - 10.3 mg/dL   Total Protein 5.1 (L) 6.5 - 8.1 g/dL    Albumin 1.9 (L) 3.5 - 5.0 g/dL   AST 28 15 - 41 U/L   ALT 16 0 - 44 U/L   Alkaline Phosphatase 108 38 - 126 U/L   Total Bilirubin 0.7 0.3 - 1.2 mg/dL   GFR calc non Af Amer >60 >60 mL/min   GFR calc Af Amer >60 >60 mL/min   Anion gap 18 (H) 5 - 15    Comment: Performed at [facility], [address] [place], [place] [address]  CBC with Differential/Platelet     Status: Abnormal   Collection Time: 04/22/19  5:37 AM  Result Value Ref Range   WBC 45.9 (H) 4.0 - 10.5 K/uL    Comment: REPEATED TO VERIFY WHITE COUNT CONFIRMED ON SMEAR    RBC 1.68 (L) 3.87 - 5.11 MIL/uL   Hemoglobin 5.6 (LL) 12.0 - 15.0 g/dL    Comment: REPEATED TO VERIFY THIS CRITICAL RESULT HAS VERIFIED AND BEEN CALLED TO R,DICKS BY AISHA MOHAMED ON 05 09 2020 AT 0634, AND HAS BEEN READ BACK.     HCT 18.6 (L) 36.0 - 46.0 %   MCV 110.7 (H) 80.0 - 100.0 fL   MCH 33.3 26.0 - 34.0 pg   MCHC 30.1 30.0 - 36.0 g/dL   RDW 83.3 (H) 82.5 - 05.3 %  Platelets 767 (H) 150 - 400 K/uL   nRBC 0.0 0.0 - 0.2 %   Neutrophils Relative % 86 %   Neutro Abs 39.4 (H) 1.7 - 7.7 K/uL   Lymphocytes Relative 4 %   Lymphs Abs 1.7 0.7 - 4.0 K/uL   Monocytes Relative 5 %   Monocytes Absolute 2.3 (H) 0.1 - 1.0 K/uL   Eosinophils Relative 0 %   Eosinophils Absolute 0.0 0.0 - 0.5 K/uL   Basophils Relative 0 %   Basophils Absolute 0.1 0.0 - 0.1 K/uL   WBC Morphology MILD LEFT SHIFT (1-5% METAS, OCC MYELO, OCC BANDS)    Immature Granulocytes 5 %   Abs Immature Granulocytes 2.39 (H) 0.00 - 0.07 K/uL    Comment: Performed at [facility], [address], [place], [place] [address]  Protime-INR     Status: Abnormal   Collection Time: 04/22/19  5:37 AM  Result Value Ref Range   Prothrombin Time 22.9 (H) 11.4 - 15.2 seconds   INR 2.1 (H) 0.8 - 1.2    Comment: (NOTE) INR goal varies based on device and disease states. Performed at [facility], [address] [place], [place]  [address]   Prealbumin     Status: Abnormal   Collection Time: 04/22/19  5:37 AM  Result Value Ref Range   Prealbumin 6.6 (L) 18 - 38 mg/dL    Comment: Performed at [facility], [address] [place], [place] [address]  Prepare RBC     Status: None   Collection Time: 04/22/19  7:08 AM  Result Value Ref Range   Order Confirmation      ORDER PROCESSED BY BLOOD BANK Performed at [facility], [address], [place], [place] [address]   Type and screen [facility]     Status: None (Preliminary result)   Collection Time: 04/22/19  7:10 AM  Result Value Ref Range   ABO/RH(D) O POS    Antibody Screen NEG    Sample Expiration 04/25/2019,2359    Unit Number [id number]    Blood Component Type RED CELLS,LR    Unit division 00    Status of Unit ISSUED    Transfusion Status OK TO TRANSFUSE    Crossmatch Result Compatible    Unit Number [id number]    Blood Component Type RED CELLS,LR    Unit division 00    Status of Unit ISSUED    Transfusion Status OK TO TRANSFUSE    Crossmatch Result Compatible    Unit Number [id number]    Blood Component Type RED CELLS,LR    Unit division 00    Status of Unit ISSUED    Transfusion Status OK TO TRANSFUSE    Crossmatch Result      Compatible Performed at [facility], [address] [place], [place] [address]   Glucose, capillary     Status: Abnormal   Collection Time: 04/22/19  7:43 AM  Result Value Ref Range   Glucose-Capillary 198 (H) 70 - 99 mg/dL  CBC with Differential/Platelet     Status: Abnormal   Collection Time: 04/22/19  7:44 AM  Result Value Ref Range   WBC 53.3 (HH) 4.0 - 10.5 K/uL    Comment: This critical result has verified and been called to [facility] by Zetta Bills on 05 09 2020 at 0944, and has been read back. CRITICAL VERIFIED   RBC 1.41 (L) 3.87 - 5.11 MIL/uL   Hemoglobin 4.8 (  LL) 12.0 - 15.0 g/dL    Comment: This critical result has  verified and been called to [facility] by Zetta Bills on 05 09 2020 at 0944, and has been read back. CRITICAL VERIFIED   HCT 14.8 (L) 36.0 - 46.0 %   MCV 105.0 (H) 80.0 - 100.0 fL   MCH 34.0 26.0 - 34.0 pg   MCHC 32.4 30.0 - 36.0 g/dL   RDW 16.1 (H) 09.6 - 04.5 %   Platelets 689 (H) 150 - 400 K/uL   nRBC 0.0 0.0 - 0.2 %   Neutrophils Relative % 89 %   Neutro Abs 46.8 (H) 1.7 - 7.7 K/uL   Lymphocytes Relative 3 %   Lymphs Abs 1.6 0.7 - 4.0 K/uL   Monocytes Relative 4 %   Monocytes Absolute 2.3 (H) 0.1 - 1.0 K/uL   Eosinophils Relative 0 %   Eosinophils Absolute 0.1 0.0 - 0.5 K/uL   Basophils Relative 0 %   Basophils Absolute 0.1 0.0 - 0.1 K/uL   WBC Morphology INCREASED BANDS (>20% BANDS)     Comment: MODERATE LEFT SHIFT (>5% METAS AND MYELOS,OCC PRO NOTED)   Immature Granulocytes 4 %   Abs Immature Granulocytes 2.35 (H) 0.00 - 0.07 K/uL   Polychromasia PRESENT     Comment: Performed at [facility], [address], [place], [place] [address]  Glucose, capillary     Status: Abnormal   Collection Time: 04/22/19 11:28 AM  Result Value Ref Range   Glucose-Capillary 161 (H) 70 - 99 mg/dL  CBC     Status: Abnormal   Collection Time: 04/22/19  1:27 PM  Result Value Ref Range   WBC 30.7 (H) 4.0 - 10.5 K/uL   RBC 2.65 (L) 3.87 - 5.11 MIL/uL   Hemoglobin 8.3 (L) 12.0 - 15.0 g/dL   HCT 19.1 (L) 47.8 - 29.5 %   MCV 96.2 80.0 - 100.0 fL   MCH 31.3 26.0 - 34.0 pg   MCHC 32.5 30.0 - 36.0 g/dL   RDW 62.1 (H) 30.8 - 65.7 %   Platelets 542 (H) 150 - 400 K/uL   nRBC 0.00 0.0 - 0.2 %    Comment: Performed at [facility], [address] [place], [place] [address]  Protime-INR     Status: Abnormal   Collection Time: 04/22/19  1:27 PM  Result Value Ref Range   Prothrombin Time 20.9 (H) 11.4 - 15.2 seconds   INR 1.8 (H) 0.8 - 1.2    Comment: (NOTE) INR goal varies based on device and disease states. Performed at [facility], [address] [place], [place] [address]   Prepare fresh frozen plasma     Status: None (Preliminary result)   Collection Time: 04/22/19  1:44 PM  Result Value Ref Range   Unit Number [id number]    Blood Component Type THAWED PLASMA    Unit division 00    Status of Unit ISSUED    Transfusion Status      OK TO TRANSFUSE Performed at [facility], [address] [place], [place] [address]    Unit Number [id number]    Blood Component Type THAWED PLASMA    Unit division 00    Status of Unit ISSUED    Transfusion Status OK TO TRANSFUSE   Glucose, capillary     Status: Abnormal   Collection Time: 04/22/19  5:44 PM  Result Value Ref Range   Glucose-Capillary 151 (H) 70 -  99 mg/dL  Glucose, capillary     Status: Abnormal   Collection Time: 04/22/19  9:51 PM  Result Value Ref Range   Glucose-Capillary 184 (H) 70 - 99 mg/dL  CBC with Differential/Platelet     Status: Abnormal   Collection Time: 04/22/19 10:05 PM  Result Value Ref Range   WBC 25.9 (H) 4.0 - 10.5 K/uL    Comment: WHITE COUNT CONFIRMED ON SMEAR   RBC 1.86 (L) 3.87 - 5.11 MIL/uL   Hemoglobin 6.0 (LL) 12.0 - 15.0 g/dL    Comment: REPEATED TO VERIFY POST TRANSFUSION SPECIMEN CRITICAL VALUE NOTED.  VALUE IS CONSISTENT WITH PREVIOUSLY REPORTED AND CALLED VALUE. DELTA CHECK NOTED CRITICAL RESULT CALLED TO, READ BACK BY AND VERIFIED WITH: DUNN,T @ 2251 ON [id number] BY POTEAT,S    HCT 17.6 (L) 36.0 - 46.0 %   MCV 94.6 80.0 - 100.0 fL    Comment: POST TRANSFUSION SPECIMEN REPEATED TO VERIFY DELTA CHECK NOTED    MCH 32.3 26.0 - 34.0 pg   MCHC 34.1 30.0 - 36.0 g/dL   RDW 91.4 (H) 78.2 - 95.6 %   Platelets 486 (H) 150 - 400 K/uL   nRBC 0.0 0.0 - 0.2 %   Neutrophils Relative % 87 %   Neutro Abs 22.5 (H) 1.7 - 7.7 K/uL   Lymphocytes Relative 5 %   Lymphs Abs 1.3 0.7 - 4.0 K/uL   Monocytes Relative 6 %   Monocytes Absolute 1.6 (H) 0.1 - 1.0 K/uL   Eosinophils Relative 0 %   Eosinophils Absolute 0.0  0.0 - 0.5 K/uL   Basophils Relative 0 %   Basophils Absolute 0.0 0.0 - 0.1 K/uL   Immature Granulocytes 2 %   Abs Immature Granulocytes 0.47 (H) 0.00 - 0.07 K/uL   Polychromasia PRESENT    Target Cells PRESENT     Comment: Performed at [facility], [address], [place], [place] [address]  Prepare RBC     Status: None   Collection Time: 04/22/19 11:31 PM  Result Value Ref Range   Order Confirmation      ORDER PROCESSED BY BLOOD BANK Performed at [facility], [address] [place], [place] [address]   Glucose, capillary     Status: Abnormal   Collection Time: 04/23/19 12:18 AM  Result Value Ref Range   Glucose-Capillary 188 (H) 70 - 99 mg/dL  Comprehensive metabolic panel     Status: Abnormal   Collection Time: 04/23/19  2:43 AM  Result Value Ref Range   Sodium 139 135 - 145 mmol/L   Potassium 3.1 (L) 3.5 - 5.1 mmol/L    Comment: DELTA CHECK NOTED   Chloride 106 98 - 111 mmol/L   CO2 23 22 - 32 mmol/L   Glucose, Bld 186 (H) 70 - 99 mg/dL   BUN 21 8 - 23 mg/dL   Creatinine, Ser 6.96 0.44 - 1.00 mg/dL   Calcium 7.6 (L) 8.9 - 10.3 mg/dL   Total Protein 5.0 (L) 6.5 - 8.1 g/dL   Albumin 2.3 (L) 3.5 - 5.0 g/dL   AST 17 15 - 41 U/L   ALT 19 0 - 44 U/L   Alkaline Phosphatase 81 38 - 126 U/L   Total Bilirubin 0.8 0.3 - 1.2 mg/dL   GFR calc non Af Amer >60 >60 mL/min   GFR calc Af Amer >60 >60 mL/min   Anion gap 10 5 - 15    Comment: Performed at [facility],  [address] [place], [place] [address]  CBC with Differential/Platelet     Status: Abnormal   Collection Time: 04/23/19  2:43 AM  Result Value Ref Range   WBC 27.5 (H) 4.0 - 10.5 K/uL   RBC 2.54 (L) 3.87 - 5.11 MIL/uL   Hemoglobin 8.2 (L) 12.0 - 15.0 g/dL    Comment: REPEATED TO VERIFY POST TRANSFUSION SPECIMEN DELTA CHECK NOTED    HCT 24.4 (L) 36.0 - 46.0 %   MCV 96.1 80.0 - 100.0 fL   MCH 32.3 26.0 - 34.0 pg   MCHC 33.6 30.0 - 36.0 g/dL   RDW 91.416.3  (H) 78.211.5 - 15.5 %   Platelets 484 (H) 150 - 400 K/uL   nRBC 0.0 0.0 - 0.2 %   Neutrophils Relative % 87 %   Neutro Abs 23.9 (H) 1.7 - 7.7 K/uL   Lymphocytes Relative 5 %   Lymphs Abs 1.3 0.7 - 4.0 K/uL   Monocytes Relative 6 %   Monocytes Absolute 1.7 (H) 0.1 - 1.0 K/uL   Eosinophils Relative 0 %   Eosinophils Absolute 0.0 0.0 - 0.5 K/uL   Basophils Relative 0 %   Basophils Absolute 0.1 0.0 - 0.1 K/uL   WBC Morphology VACUOLATED NEUTROPHILS    Immature Granulocytes 2 %   Abs Immature Granulocytes 0.44 (H) 0.00 - 0.07 K/uL   Polychromasia PRESENT    Target Cells PRESENT     Comment: Performed at [facility], [address] [place], [place] [address]  Glucose, capillary     Status: Abnormal   Collection Time: 04/23/19  3:50 AM  Result Value Ref Range   Glucose-Capillary 156 (H) 70 - 99 mg/dL  CBC with Differential/Platelet     Status: Abnormal   Collection Time: 04/23/19  5:23 AM  Result Value Ref Range   WBC 25.9 (H) 4.0 - 10.5 K/uL   RBC 2.40 (L) 3.87 - 5.11 MIL/uL   Hemoglobin 7.9 (L) 12.0 - 15.0 g/dL   HCT 30.822.6 (L) 65.736.0 - 84.646.0 %   MCV 94.2 80.0 - 100.0 fL   MCH 32.9 26.0 - 34.0 pg   MCHC 35.0 30.0 - 36.0 g/dL   RDW 96.215.9 (H) 95.211.5 - 84.115.5 %   Platelets 434 (H) 150 - 400 K/uL   nRBC 0.0 0.0 - 0.2 %   Neutrophils Relative % 86 %   Neutro Abs 22.2 (H) 1.7 - 7.7 K/uL   Lymphocytes Relative 6 %   Lymphs Abs 1.5 0.7 - 4.0 K/uL   Monocytes Relative 6 %   Monocytes Absolute 1.7 (H) 0.1 - 1.0 K/uL   Eosinophils Relative 0 %   Eosinophils Absolute 0.0 0.0 - 0.5 K/uL   Basophils Relative 0 %   Basophils Absolute 0.1 0.0 - 0.1 K/uL   WBC Morphology VACUOLATED NEUTROPHILS    Immature Granulocytes 2 %   Abs Immature Granulocytes 0.47 (H) 0.00 - 0.07 K/uL   Polychromasia PRESENT    Target Cells PRESENT     Comment: Performed at [facility], [address] [place], [place] [address]  BMET in AM     Status: Abnormal   Collection Time:  04/23/19  5:23 AM  Result Value Ref Range   Sodium 139 135 - 145 mmol/L   Potassium 3.2 (L) 3.5 - 5.1 mmol/L   Chloride 105 98 - 111 mmol/L   CO2 24 22 - 32 mmol/L   Glucose, Bld 159 (H) 70 - 99 mg/dL  BUN 21 8 - 23 mg/dL   Creatinine, Ser 1.61 0.44 - 1.00 mg/dL   Calcium 7.6 (L) 8.9 - 10.3 mg/dL   GFR calc non Af Amer >60 >60 mL/min   GFR calc Af Amer >60 >60 mL/min   Anion gap 10 5 - 15    Comment: Performed at [facility], [address], [place], [place] [address]  Glucose, capillary     Status: Abnormal   Collection Time: 04/23/19  7:31 AM  Result Value Ref Range   Glucose-Capillary 117 (H) 70 - 99 mg/dL    Studies/Results: Ir Angiogram Visceral Selective  Result Date: 04/22/2019 INDICATION: Acute left upper quadrant hemorrhage by CTA resulting in hemorrhagic shock. Complication of pancreatitis. EXAM: Ultrasound guidance for vascular access Celiac, splenic, and left gastric catheterizations and angiograms Peripheral left gastric branch micro catheterization, angiograms, and micro coil/Gelfoam embolization for active arterial bleeding. MEDICATIONS: 1% lidocaine local. ANESTHESIA/SEDATION: Moderate (conscious) sedation was employed during this procedure. A total of Versed 1.0 mg and Fentanyl 75 mcg was administered intravenously. Moderate Sedation Time: 70 minutes. The patient's level of consciousness and vital signs were monitored continuously by radiology nursing throughout the procedure under my direct supervision. CONTRAST:  125 cc FLUOROSCOPY TIME:  Fluoroscopy Time: 12 minutes 42 seconds (1,643 mGy). COMPLICATIONS: None immediate. PROCEDURE: Informed consent was obtained from the patient following explanation of the procedure, risks, benefits and alternatives. The patient understands, agrees and consents for the procedure. All questions were addressed. A time out was performed prior to the initiation of the procedure. Maximal barrier sterile technique utilized  including caps, mask, sterile gowns, sterile gloves, large sterile drape, hand hygiene, and Betadine prep. Under sterile conditions and local anesthesia, ultrasound micropuncture access performed of the right common femoral artery. Ultrasound images obtained for documentation of the right femoral artery access. Five French sheath inserted. Five French C2 catheter utilized to select the celiac origin. Celiac angiogram performed. Celiac: The celiac origin is widely patent. The left gastric, splenic and hepatic vasculature all patent. There is an accessory left hepatic artery off the left gastric. Active contrast extravasation/bleeding is present from peripheral small in the left upper quadrant either from the splenic or left gastric branches. This active bleeding site correlates with the recent CTA. C2 catheter was advanced over a Glidewire into the splenic artery. Splenic angiogram performed. Splenic: Initial splenic angiogram suggests active bleeding from a small pancreaticoduodenal branch from the main splenic artery peripherally. Renegade STC microcatheter advanced further peripheral in the splenic artery. Additional splenic angiogram performed. This failed to demonstrate the active bleeding branch in the left upper quadrant from the splenic artery. Microcatheter removed. Repeat angiography performed of the celiac artery through the C2 catheter. Active bleeding still demonstrated, suspect now from peripheral left gastric branches. Renegade STC microcatheter was advanced into the left gastric artery over a double angled Glidewire. Peripheral left gastric angiogram performed. Left gastric: Peripheral small branches of the left gastric artery demonstrate active bleeding in the left upper quadrant correlating with the CTA. Left gastric artery peripheral branch catheterization, angiograms, and micro coil/Gelfoam embolization: Initially the microcatheter was advanced into a medial peripheral branch. Contrast injection  failed to demonstrate the active bleeding source. Next, the microcatheter was retracted and advanced into a more lateral left gastric peripheral branch. This confirms the active bleeding source. For embolization: 2 x 4 standard and soft interlock coils and 3 x 6 standard and 3 x 10 soft interlock coils were deployed into the peripheral left gastric branch  at the bleeding site. Following micro coil embolization, repeat left gastric angiogram confirms near complete stasis. With delayed imaging, there is still some small peripheral left gastric branches with collateral supply to the bleeding site. Because of this, approximately 5 cc of Gel-Foam slurry was slowly instilled into the left gastric vascular territory at the bleeding site. Following micro coil and Gelfoam embolization, the left gastric vascular territory at the bleeding site demonstrates complete stasis. No further active bleeding. Microcatheter removed. Repeat angiogram through the C2 catheter demonstrates no additional source of bleeding. Access removed. Hemostasis obtained with the ExoSeal device. No immediate complication. Patient tolerated the procedure well. IMPRESSION: Successful peripheral left gastric artery micro coil and Gelfoam embolization at the active bleeding site which correlated with the CTA. Electronically Signed   By: Judie Petit.  Shick M.D.   On: 04/22/2019 17:48   Ir Angiogram Selective Each Additional Vessel  Result Date: 04/22/2019 INDICATION: Acute left upper quadrant hemorrhage by CTA resulting in hemorrhagic shock. Complication of pancreatitis. EXAM: Ultrasound guidance for vascular access Celiac, splenic, and left gastric catheterizations and angiograms Peripheral left gastric branch micro catheterization, angiograms, and micro coil/Gelfoam embolization for active arterial bleeding. MEDICATIONS: 1% lidocaine local. ANESTHESIA/SEDATION: Moderate (conscious) sedation was employed during this procedure. A total of Versed 1.0 mg and  Fentanyl 75 mcg was administered intravenously. Moderate Sedation Time: 70 minutes. The patient's level of consciousness and vital signs were monitored continuously by radiology nursing throughout the procedure under my direct supervision. CONTRAST:  125 cc FLUOROSCOPY TIME:  Fluoroscopy Time: 12 minutes 42 seconds (1,643 mGy). COMPLICATIONS: None immediate. PROCEDURE: Informed consent was obtained from the patient following explanation of the procedure, risks, benefits and alternatives. The patient understands, agrees and consents for the procedure. All questions were addressed. A time out was performed prior to the initiation of the procedure. Maximal barrier sterile technique utilized including caps, mask, sterile gowns, sterile gloves, large sterile drape, hand hygiene, and Betadine prep. Under sterile conditions and local anesthesia, ultrasound micropuncture access performed of the right common femoral artery. Ultrasound images obtained for documentation of the right femoral artery access. Five French sheath inserted. Five French C2 catheter utilized to select the celiac origin. Celiac angiogram performed. Celiac: The celiac origin is widely patent. The left gastric, splenic and hepatic vasculature all patent. There is an accessory left hepatic artery off the left gastric. Active contrast extravasation/bleeding is present from peripheral small in the left upper quadrant either from the splenic or left gastric branches. This active bleeding site correlates with the recent CTA. C2 catheter was advanced over a Glidewire into the splenic artery. Splenic angiogram performed. Splenic: Initial splenic angiogram suggests active bleeding from a small pancreaticoduodenal branch from the main splenic artery peripherally. Renegade STC microcatheter advanced further peripheral in the splenic artery. Additional splenic angiogram performed. This failed to demonstrate the active bleeding branch in the left upper quadrant from  the splenic artery. Microcatheter removed. Repeat angiography performed of the celiac artery through the C2 catheter. Active bleeding still demonstrated, suspect now from peripheral left gastric branches. Renegade STC microcatheter was advanced into the left gastric artery over a double angled Glidewire. Peripheral left gastric angiogram performed. Left gastric: Peripheral small branches of the left gastric artery demonstrate active bleeding in the left upper quadrant correlating with the CTA. Left gastric artery peripheral branch catheterization, angiograms, and micro coil/Gelfoam embolization: Initially the microcatheter was advanced into a medial peripheral branch. Contrast injection failed to demonstrate the active bleeding source. Next, the microcatheter was retracted and  advanced into a more lateral left gastric peripheral branch. This confirms the active bleeding source. For embolization: 2 x 4 standard and soft interlock coils and 3 x 6 standard and 3 x 10 soft interlock coils were deployed into the peripheral left gastric branch at the bleeding site. Following micro coil embolization, repeat left gastric angiogram confirms near complete stasis. With delayed imaging, there is still some small peripheral left gastric branches with collateral supply to the bleeding site. Because of this, approximately 5 cc of Gel-Foam slurry was slowly instilled into the left gastric vascular territory at the bleeding site. Following micro coil and Gelfoam embolization, the left gastric vascular territory at the bleeding site demonstrates complete stasis. No further active bleeding. Microcatheter removed. Repeat angiogram through the C2 catheter demonstrates no additional source of bleeding. Access removed. Hemostasis obtained with the ExoSeal device. No immediate complication. Patient tolerated the procedure well. IMPRESSION: Successful peripheral left gastric artery micro coil and Gelfoam embolization at the active bleeding  site which correlated with the CTA. Electronically Signed   By: Judie Petit.  Shick M.D.   On: 04/22/2019 17:48   Ir Angiogram Selective Each Additional Vessel  Result Date: 04/22/2019 INDICATION: Acute left upper quadrant hemorrhage by CTA resulting in hemorrhagic shock. Complication of pancreatitis. EXAM: Ultrasound guidance for vascular access Celiac, splenic, and left gastric catheterizations and angiograms Peripheral left gastric branch micro catheterization, angiograms, and micro coil/Gelfoam embolization for active arterial bleeding. MEDICATIONS: 1% lidocaine local. ANESTHESIA/SEDATION: Moderate (conscious) sedation was employed during this procedure. A total of Versed 1.0 mg and Fentanyl 75 mcg was administered intravenously. Moderate Sedation Time: 70 minutes. The patient's level of consciousness and vital signs were monitored continuously by radiology nursing throughout the procedure under my direct supervision. CONTRAST:  125 cc FLUOROSCOPY TIME:  Fluoroscopy Time: 12 minutes 42 seconds (1,643 mGy). COMPLICATIONS: None immediate. PROCEDURE: Informed consent was obtained from the patient following explanation of the procedure, risks, benefits and alternatives. The patient understands, agrees and consents for the procedure. All questions were addressed. A time out was performed prior to the initiation of the procedure. Maximal barrier sterile technique utilized including caps, mask, sterile gowns, sterile gloves, large sterile drape, hand hygiene, and Betadine prep. Under sterile conditions and local anesthesia, ultrasound micropuncture access performed of the right common femoral artery. Ultrasound images obtained for documentation of the right femoral artery access. Five French sheath inserted. Five French C2 catheter utilized to select the celiac origin. Celiac angiogram performed. Celiac: The celiac origin is widely patent. The left gastric, splenic and hepatic vasculature all patent. There is an accessory  left hepatic artery off the left gastric. Active contrast extravasation/bleeding is present from peripheral small in the left upper quadrant either from the splenic or left gastric branches. This active bleeding site correlates with the recent CTA. C2 catheter was advanced over a Glidewire into the splenic artery. Splenic angiogram performed. Splenic: Initial splenic angiogram suggests active bleeding from a small pancreaticoduodenal branch from the main splenic artery peripherally. Renegade STC microcatheter advanced further peripheral in the splenic artery. Additional splenic angiogram performed. This failed to demonstrate the active bleeding branch in the left upper quadrant from the splenic artery. Microcatheter removed. Repeat angiography performed of the celiac artery through the C2 catheter. Active bleeding still demonstrated, suspect now from peripheral left gastric branches. Renegade STC microcatheter was advanced into the left gastric artery over a double angled Glidewire. Peripheral left gastric angiogram performed. Left gastric: Peripheral small branches of the left gastric artery demonstrate active bleeding  in the left upper quadrant correlating with the CTA. Left gastric artery peripheral branch catheterization, angiograms, and micro coil/Gelfoam embolization: Initially the microcatheter was advanced into a medial peripheral branch. Contrast injection failed to demonstrate the active bleeding source. Next, the microcatheter was retracted and advanced into a more lateral left gastric peripheral branch. This confirms the active bleeding source. For embolization: 2 x 4 standard and soft interlock coils and 3 x 6 standard and 3 x 10 soft interlock coils were deployed into the peripheral left gastric branch at the bleeding site. Following micro coil embolization, repeat left gastric angiogram confirms near complete stasis. With delayed imaging, there is still some small peripheral left gastric branches with  collateral supply to the bleeding site. Because of this, approximately 5 cc of Gel-Foam slurry was slowly instilled into the left gastric vascular territory at the bleeding site. Following micro coil and Gelfoam embolization, the left gastric vascular territory at the bleeding site demonstrates complete stasis. No further active bleeding. Microcatheter removed. Repeat angiogram through the C2 catheter demonstrates no additional source of bleeding. Access removed. Hemostasis obtained with the ExoSeal device. No immediate complication. Patient tolerated the procedure well. IMPRESSION: Successful peripheral left gastric artery micro coil and Gelfoam embolization at the active bleeding site which correlated with the CTA. Electronically Signed   By: Judie Petit.  Shick M.D.   On: 04/22/2019 17:48   Ir Angiogram Selective Each Additional Vessel  Result Date: 04/22/2019 INDICATION: Acute left upper quadrant hemorrhage by CTA resulting in hemorrhagic shock. Complication of pancreatitis. EXAM: Ultrasound guidance for vascular access Celiac, splenic, and left gastric catheterizations and angiograms Peripheral left gastric branch micro catheterization, angiograms, and micro coil/Gelfoam embolization for active arterial bleeding. MEDICATIONS: 1% lidocaine local. ANESTHESIA/SEDATION: Moderate (conscious) sedation was employed during this procedure. A total of Versed 1.0 mg and Fentanyl 75 mcg was administered intravenously. Moderate Sedation Time: 70 minutes. The patient's level of consciousness and vital signs were monitored continuously by radiology nursing throughout the procedure under my direct supervision. CONTRAST:  125 cc FLUOROSCOPY TIME:  Fluoroscopy Time: 12 minutes 42 seconds (1,643 mGy). COMPLICATIONS: None immediate. PROCEDURE: Informed consent was obtained from the patient following explanation of the procedure, risks, benefits and alternatives. The patient understands, agrees and consents for the procedure. All  questions were addressed. A time out was performed prior to the initiation of the procedure. Maximal barrier sterile technique utilized including caps, mask, sterile gowns, sterile gloves, large sterile drape, hand hygiene, and Betadine prep. Under sterile conditions and local anesthesia, ultrasound micropuncture access performed of the right common femoral artery. Ultrasound images obtained for documentation of the right femoral artery access. Five French sheath inserted. Five French C2 catheter utilized to select the celiac origin. Celiac angiogram performed. Celiac: The celiac origin is widely patent. The left gastric, splenic and hepatic vasculature all patent. There is an accessory left hepatic artery off the left gastric. Active contrast extravasation/bleeding is present from peripheral small in the left upper quadrant either from the splenic or left gastric branches. This active bleeding site correlates with the recent CTA. C2 catheter was advanced over a Glidewire into the splenic artery. Splenic angiogram performed. Splenic: Initial splenic angiogram suggests active bleeding from a small pancreaticoduodenal branch from the main splenic artery peripherally. Renegade STC microcatheter advanced further peripheral in the splenic artery. Additional splenic angiogram performed. This failed to demonstrate the active bleeding branch in the left upper quadrant from the splenic artery. Microcatheter removed. Repeat angiography performed of the celiac artery through the C2  catheter. Active bleeding still demonstrated, suspect now from peripheral left gastric branches. Renegade STC microcatheter was advanced into the left gastric artery over a double angled Glidewire. Peripheral left gastric angiogram performed. Left gastric: Peripheral small branches of the left gastric artery demonstrate active bleeding in the left upper quadrant correlating with the CTA. Left gastric artery peripheral branch catheterization,  angiograms, and micro coil/Gelfoam embolization: Initially the microcatheter was advanced into a medial peripheral branch. Contrast injection failed to demonstrate the active bleeding source. Next, the microcatheter was retracted and advanced into a more lateral left gastric peripheral branch. This confirms the active bleeding source. For embolization: 2 x 4 standard and soft interlock coils and 3 x 6 standard and 3 x 10 soft interlock coils were deployed into the peripheral left gastric branch at the bleeding site. Following micro coil embolization, repeat left gastric angiogram confirms near complete stasis. With delayed imaging, there is still some small peripheral left gastric branches with collateral supply to the bleeding site. Because of this, approximately 5 cc of Gel-Foam slurry was slowly instilled into the left gastric vascular territory at the bleeding site. Following micro coil and Gelfoam embolization, the left gastric vascular territory at the bleeding site demonstrates complete stasis. No further active bleeding. Microcatheter removed. Repeat angiogram through the C2 catheter demonstrates no additional source of bleeding. Access removed. Hemostasis obtained with the ExoSeal device. No immediate complication. Patient tolerated the procedure well. IMPRESSION: Successful peripheral left gastric artery micro coil and Gelfoam embolization at the active bleeding site which correlated with the CTA. Electronically Signed   By: Judie Petit.  Shick M.D.   On: 04/22/2019 17:48   Ir Angiogram Selective Each Additional Vessel  Result Date: 04/22/2019 INDICATION: Acute left upper quadrant hemorrhage by CTA resulting in hemorrhagic shock. Complication of pancreatitis. EXAM: Ultrasound guidance for vascular access Celiac, splenic, and left gastric catheterizations and angiograms Peripheral left gastric branch micro catheterization, angiograms, and micro coil/Gelfoam embolization for active arterial bleeding. MEDICATIONS:  1% lidocaine local. ANESTHESIA/SEDATION: Moderate (conscious) sedation was employed during this procedure. A total of Versed 1.0 mg and Fentanyl 75 mcg was administered intravenously. Moderate Sedation Time: 70 minutes. The patient's level of consciousness and vital signs were monitored continuously by radiology nursing throughout the procedure under my direct supervision. CONTRAST:  125 cc FLUOROSCOPY TIME:  Fluoroscopy Time: 12 minutes 42 seconds (1,643 mGy). COMPLICATIONS: None immediate. PROCEDURE: Informed consent was obtained from the patient following explanation of the procedure, risks, benefits and alternatives. The patient understands, agrees and consents for the procedure. All questions were addressed. A time out was performed prior to the initiation of the procedure. Maximal barrier sterile technique utilized including caps, mask, sterile gowns, sterile gloves, large sterile drape, hand hygiene, and Betadine prep. Under sterile conditions and local anesthesia, ultrasound micropuncture access performed of the right common femoral artery. Ultrasound images obtained for documentation of the right femoral artery access. Five French sheath inserted. Five French C2 catheter utilized to select the celiac origin. Celiac angiogram performed. Celiac: The celiac origin is widely patent. The left gastric, splenic and hepatic vasculature all patent. There is an accessory left hepatic artery off the left gastric. Active contrast extravasation/bleeding is present from peripheral small in the left upper quadrant either from the splenic or left gastric branches. This active bleeding site correlates with the recent CTA. C2 catheter was advanced over a Glidewire into the splenic artery. Splenic angiogram performed. Splenic: Initial splenic angiogram suggests active bleeding from a small pancreaticoduodenal branch from the main splenic  artery peripherally. Renegade STC microcatheter advanced further peripheral in the  splenic artery. Additional splenic angiogram performed. This failed to demonstrate the active bleeding branch in the left upper quadrant from the splenic artery. Microcatheter removed. Repeat angiography performed of the celiac artery through the C2 catheter. Active bleeding still demonstrated, suspect now from peripheral left gastric branches. Renegade STC microcatheter was advanced into the left gastric artery over a double angled Glidewire. Peripheral left gastric angiogram performed. Left gastric: Peripheral small branches of the left gastric artery demonstrate active bleeding in the left upper quadrant correlating with the CTA. Left gastric artery peripheral branch catheterization, angiograms, and micro coil/Gelfoam embolization: Initially the microcatheter was advanced into a medial peripheral branch. Contrast injection failed to demonstrate the active bleeding source. Next, the microcatheter was retracted and advanced into a more lateral left gastric peripheral branch. This confirms the active bleeding source. For embolization: 2 x 4 standard and soft interlock coils and 3 x 6 standard and 3 x 10 soft interlock coils were deployed into the peripheral left gastric branch at the bleeding site. Following micro coil embolization, repeat left gastric angiogram confirms near complete stasis. With delayed imaging, there is still some small peripheral left gastric branches with collateral supply to the bleeding site. Because of this, approximately 5 cc of Gel-Foam slurry was slowly instilled into the left gastric vascular territory at the bleeding site. Following micro coil and Gelfoam embolization, the left gastric vascular territory at the bleeding site demonstrates complete stasis. No further active bleeding. Microcatheter removed. Repeat angiogram through the C2 catheter demonstrates no additional source of bleeding. Access removed. Hemostasis obtained with the ExoSeal device. No immediate complication. Patient  tolerated the procedure well. IMPRESSION: Successful peripheral left gastric artery micro coil and Gelfoam embolization at the active bleeding site which correlated with the CTA. Electronically Signed   By: Judie Petit.  Shick M.D.   On: 04/22/2019 17:48   Ir US Guide Vasc Access Right  Result Date: 04/22/2019 INDICATION: Acute left upper quadrant hemorrhage by CTA resulting in hemorrhagic shock. Complication of pancreatitis. EXAM: Ultrasound guidance for vascular access Celiac, splenic, and left gastric catheterizations and angiograms Peripheral left gastric branch micro catheterization, angiograms, and micro coil/Gelfoam embolization for active arterial bleeding. MEDICATIONS: 1% lidocaine local. ANESTHESIA/SEDATION: Moderate (conscious) sedation was employed during this procedure. A total of Versed 1.0 mg and Fentanyl 75 mcg was administered intravenously. Moderate Sedation Time: 70 minutes. The patient's level of consciousness and vital signs were monitored continuously by radiology nursing throughout the procedure under my direct supervision. CONTRAST:  125 cc FLUOROSCOPY TIME:  Fluoroscopy Time: 12 minutes 42 seconds (1,643 mGy). COMPLICATIONS: None immediate. PROCEDURE: Informed consent was obtained from the patient following explanation of the procedure, risks, benefits and alternatives. The patient understands, agrees and consents for the procedure. All questions were addressed. A time out was performed prior to the initiation of the procedure. Maximal barrier sterile technique utilized including caps, mask, sterile gowns, sterile gloves, large sterile drape, hand hygiene, and Betadine prep. Under sterile conditions and local anesthesia, ultrasound micropuncture access performed of the right common femoral artery. Ultrasound images obtained for documentation of the right femoral artery access. Five French sheath inserted. Five French C2 catheter utilized to select the celiac origin. Celiac angiogram performed.  Celiac: The celiac origin is widely patent. The left gastric, splenic and hepatic vasculature all patent. There is an accessory left hepatic artery off the left gastric. Active contrast extravasation/bleeding is present from peripheral small in the left upper quadrant either  from the splenic or left gastric branches. This active bleeding site correlates with the recent CTA. C2 catheter was advanced over a Glidewire into the splenic artery. Splenic angiogram performed. Splenic: Initial splenic angiogram suggests active bleeding from a small pancreaticoduodenal branch from the main splenic artery peripherally. Renegade STC microcatheter advanced further peripheral in the splenic artery. Additional splenic angiogram performed. This failed to demonstrate the active bleeding branch in the left upper quadrant from the splenic artery. Microcatheter removed. Repeat angiography performed of the celiac artery through the C2 catheter. Active bleeding still demonstrated, suspect now from peripheral left gastric branches. Renegade STC microcatheter was advanced into the left gastric artery over a double angled Glidewire. Peripheral left gastric angiogram performed. Left gastric: Peripheral small branches of the left gastric artery demonstrate active bleeding in the left upper quadrant correlating with the CTA. Left gastric artery peripheral branch catheterization, angiograms, and micro coil/Gelfoam embolization: Initially the microcatheter was advanced into a medial peripheral branch. Contrast injection failed to demonstrate the active bleeding source. Next, the microcatheter was retracted and advanced into a more lateral left gastric peripheral branch. This confirms the active bleeding source. For embolization: 2 x 4 standard and soft interlock coils and 3 x 6 standard and 3 x 10 soft interlock coils were deployed into the peripheral left gastric branch at the bleeding site. Following micro coil embolization, repeat left  gastric angiogram confirms near complete stasis. With delayed imaging, there is still some small peripheral left gastric branches with collateral supply to the bleeding site. Because of this, approximately 5 cc of Gel-Foam slurry was slowly instilled into the left gastric vascular territory at the bleeding site. Following micro coil and Gelfoam embolization, the left gastric vascular territory at the bleeding site demonstrates complete stasis. No further active bleeding. Microcatheter removed. Repeat angiogram through the C2 catheter demonstrates no additional source of bleeding. Access removed. Hemostasis obtained with the ExoSeal device. No immediate complication. Patient tolerated the procedure well. IMPRESSION: Successful peripheral left gastric artery micro coil and Gelfoam embolization at the active bleeding site which correlated with the CTA. Electronically Signed   ByJudie Petit: M.  Shick M.D.   On: 04/22/2019 17:48   Dg Chest Port 1 View  Result Date: 04/22/2019 CLINICAL DATA:  Central line placement. EXAM: PORTABLE CHEST 1 VIEW COMPARISON:  04/20/2011 FINDINGS: A LEFT subclavian central venous catheter is noted with tip overlying the LOWER SVC. Cardiomediastinal silhouette is unchanged. LEFT LOWER lung consolidation/atelectasis/pleural effusion again noted. There is no evidence of pneumothorax. IMPRESSION: LEFT subclavian central venous catheter placement with tip overlying the LOWER SVC. No pneumothorax. Little significant change of LEFT LOWER lung consolidation/atelectasis/pleural effusion. Electronically Signed   By: Harmon Pier M.D.   On: 04/22/2019 11:44   Dg Abd 2 Views  Result Date: 04/22/2019 CLINICAL DATA:  Acute right-sided abdominal pain. EXAM: ABDOMEN - 2 VIEW COMPARISON:  CT scan of Apr 20, 2019. Radiographs of October 18, 2018. FINDINGS: Mildly dilated small bowel loops are noted in the left lower quadrant which may represent ileus or possibly obstruction. No free air is noted to suggest  pneumoperitoneum. Atherosclerosis of abdominal aorta is noted. Status post cholecystectomy. No significant colonic dilatation is noted. IMPRESSION: Mildly dilated small bowel loops are noted in the left lower quadrant of the abdomen which may represent ileus or possibly obstruction. Aortic Atherosclerosis (ICD10-I70.0). Electronically Signed   By: Lupita Raider M.D.   On: 04/22/2019 08:41   Ir Embo Arterial Not Hemorr [employer] Guide Roadmapping  Result Date:  04/22/2019 INDICATION: Acute left upper quadrant hemorrhage by CTA resulting in hemorrhagic shock. Complication of pancreatitis. EXAM: Ultrasound guidance for vascular access Celiac, splenic, and left gastric catheterizations and angiograms Peripheral left gastric branch micro catheterization, angiograms, and micro coil/Gelfoam embolization for active arterial bleeding. MEDICATIONS: 1% lidocaine local. ANESTHESIA/SEDATION: Moderate (conscious) sedation was employed during this procedure. A total of Versed 1.0 mg and Fentanyl 75 mcg was administered intravenously. Moderate Sedation Time: 70 minutes. The patient's level of consciousness and vital signs were monitored continuously by radiology nursing throughout the procedure under my direct supervision. CONTRAST:  125 cc FLUOROSCOPY TIME:  Fluoroscopy Time: 12 minutes 42 seconds (1,643 mGy). COMPLICATIONS: None immediate. PROCEDURE: Informed consent was obtained from the patient following explanation of the procedure, risks, benefits and alternatives. The patient understands, agrees and consents for the procedure. All questions were addressed. A time out was performed prior to the initiation of the procedure. Maximal barrier sterile technique utilized including caps, mask, sterile gowns, sterile gloves, large sterile drape, hand hygiene, and Betadine prep. Under sterile conditions and local anesthesia, ultrasound micropuncture access performed of the right common femoral artery. Ultrasound images obtained  for documentation of the right femoral artery access. Five French sheath inserted. Five French C2 catheter utilized to select the celiac origin. Celiac angiogram performed. Celiac: The celiac origin is widely patent. The left gastric, splenic and hepatic vasculature all patent. There is an accessory left hepatic artery off the left gastric. Active contrast extravasation/bleeding is present from peripheral small in the left upper quadrant either from the splenic or left gastric branches. This active bleeding site correlates with the recent CTA. C2 catheter was advanced over a Glidewire into the splenic artery. Splenic angiogram performed. Splenic: Initial splenic angiogram suggests active bleeding from a small pancreaticoduodenal branch from the main splenic artery peripherally. Renegade STC microcatheter advanced further peripheral in the splenic artery. Additional splenic angiogram performed. This failed to demonstrate the active bleeding branch in the left upper quadrant from the splenic artery. Microcatheter removed. Repeat angiography performed of the celiac artery through the C2 catheter. Active bleeding still demonstrated, suspect now from peripheral left gastric branches. Renegade STC microcatheter was advanced into the left gastric artery over a double angled Glidewire. Peripheral left gastric angiogram performed. Left gastric: Peripheral small branches of the left gastric artery demonstrate active bleeding in the left upper quadrant correlating with the CTA. Left gastric artery peripheral branch catheterization, angiograms, and micro coil/Gelfoam embolization: Initially the microcatheter was advanced into a medial peripheral branch. Contrast injection failed to demonstrate the active bleeding source. Next, the microcatheter was retracted and advanced into a more lateral left gastric peripheral branch. This confirms the active bleeding source. For embolization: 2 x 4 standard and soft interlock coils and 3 x  6 standard and 3 x 10 soft interlock coils were deployed into the peripheral left gastric branch at the bleeding site. Following micro coil embolization, repeat left gastric angiogram confirms near complete stasis. With delayed imaging, there is still some small peripheral left gastric branches with collateral supply to the bleeding site. Because of this, approximately 5 cc of Gel-Foam slurry was slowly instilled into the left gastric vascular territory at the bleeding site. Following micro coil and Gelfoam embolization, the left gastric vascular territory at the bleeding site demonstrates complete stasis. No further active bleeding. Microcatheter removed. Repeat angiogram through the C2 catheter demonstrates no additional source of bleeding. Access removed. Hemostasis obtained with the ExoSeal device. No immediate complication. Patient tolerated the procedure well. IMPRESSION:  Successful peripheral left gastric artery micro coil and Gelfoam embolization at the active bleeding site which correlated with the CTA. Electronically Signed   By: Judie Petit.  Shick M.D.   On: 04/22/2019 17:48   Ct Angio Abd/pel W/ And/or W/o  Result Date: 04/22/2019 CLINICAL DATA:  73 year old female with a history of necrotizing pancreatitis and hypotension EXAM: CTA ABDOMEN AND PELVIS wITHOUT AND WITH CONTRAST TECHNIQUE: Multidetector CT imaging of the abdomen and pelvis was performed using the standard protocol during bolus administration of intravenous contrast. Multiplanar reconstructed images and MIPs were obtained and reviewed to evaluate the vascular anatomy. CONTRAST:  OMNIPAQUE IOHEXOL 350 MG/ML SOLN COMPARISON:  04/20/2019, 04/22/19 FINDINGS: VASCULAR Aorta: Atherosclerosis of the thoracic aorta. Greatest diameter of the aorta at the aortic hiatus measures 2.4 cm. Atherosclerosis of the abdominal aorta. Diameter of infrarenal aorta just below the renal artery origins measures 2.4 cm. Celiac: Celiac artery patent with mild  atherosclerotic changes. Celiac artery contributes to common hepatic artery, left gastric artery, splenic artery. There is a replaced left hepatic artery. There is presumed origin of the extravasated contrast into the new hematoma from left gastric branches, however, no direct communication is identified on the CT. SMA: Atherosclerotic changes of the SMA origin. Renals: Atherosclerotic changes at the bilateral renal artery origins. Estimated 50% narrowing on the right. No high-grade stenosis on the left. IMA: IMA patent. Right lower extremity: Moderate atherosclerotic changes of the right common iliac and external iliac artery. Hypogastric artery is patent. Dense calcifications of the right common femoral artery. Left lower extremity: Moderate to advanced atherosclerotic changes of the left iliac system including common iliac and external iliac artery. Hypogastric artery is patent. Common femoral artery with atherosclerotic calcifications. Veins: Unremarkable IVC and bilateral renal veins. Bilateral iliac veins patent. Redemonstration of thrombus at the spleno portal confluence with patency of the superior mesenteric vein. Splenic vein appears occluded. Multiple collateral draining veins of the left upper quadrant secondary to splenic vein occlusion, compatible with portal-portal and portal-systemic collateral veins. The portal-systemic collateral veins contribute to engorgement of the bilateral adnexal veins and the left gonadal vein. Review of the MIP images confirms the above findings. NON-VASCULAR Lower chest: Bilateral pleural effusions with associated atelectasis. Hepatobiliary: The enhancement/attenuation of the liver is uniform on the delayed images, with mottled appearance on the arterial images, reflecting delayed perfusion secondary to portal vein compromise. Cholecystectomy. Pancreas: Coarse calcifications throughout the pancreatic parenchyma. Pancreas is compressed by expanding hematoma/hemorrhage  interposed between stomach and the pancreas. There is been significant enlargement in the interval, with the prior CT dated 04/19/2021 measuring hematoma 3.2 cm. Hematoma now measures 12.6 cm x 7.2 cm. Linear hyperdense focus at the superior margin of the hematoma on image 38 of series 15 is not present on the noncontrast series, compatible extravasated contrast/hemorrhage. Pancreatic parenchyma at the pancreas neck demonstrates mottled enhancement/attenuation. Spleen: Unremarkable Adrenals/Urinary Tract: Adenoma of the left adrenal gland. Right: No hydronephrosis. Symmetric perfusion to the left. No nephrolithiasis. Unremarkable course of the right ureter. Left: No hydronephrosis. Symmetric perfusion to the right. No nephrolithiasis. Unremarkable course of the left ureter. Circumferential wall thickening of the urinary bladder which is partially distended. Stomach/Bowel: Stomach has been compressed by hematoma at the posterior wall. Borderline dilated small bowel throughout the abdomen. Wall enhancement appears within normal limits. No transition point identified. Appendix is not visualized, however, no inflammatory changes are present adjacent to the cecum to indicate an appendicitis. Colonic diverticula. Colon is relatively decompressed. Lymphatic: Multiple lymph nodes of the  gastrohepatic ligament and liver hilum, as well as at the portacaval nodal stations, periaortic/preaortic nodal stations. Small lymph nodes of the inguinal region and the bilateral iliac nodal stations. Mesenteric: Interval increasing intermediate density fluid throughout the abdomen, right abdomen, left abdomen, and pelvis. Stranding within the mesentery. Reproductive: Fibroid uterus. Other: No hernia. Musculoskeletal: No acute displaced fracture. Surgical changes of the left hip. IMPRESSION: The CT angiogram is positive for active extravasation/hemorrhage into previously demonstrated peripancreatic hematoma related to necrotizing  pancreatitis. Significant increased hematoma since the comparison CT, which now measures at least 12.6 cm in greatest diameter. These results were discussed by telephone at the time of interpretation on 04/22/2019 at 1:45pm with Dr. Rhetta Mura Increased free peritoneal fluid, compatible with peritoneal hemorrhage. Redemonstration of necrotizing pancreatitis superimposed on changes of chronic pancreatitis. This is the favored etiology of multiple lymph nodes of the upper abdomen. Redemonstration of portal vein thrombus at the spleno portal confluence with occluded splenic vein. The splenic vein occlusion contributes to multiple collateral draining veins of the left upper quadrant, as above. Aortic atherosclerosis, mesenteric arterial disease, bilateral renal arterial disease, and bilateral iliofemoral disease. Aortic Atherosclerosis (ICD10-I70.0). Partially distended small bowel, potentially a developing ileus. Additional ancillary findings as above. Signed, Yvone Neu. Reyne Dumas, RPVI [facility] Radiology Electronically Signed   By: Gilmer Mor D.O.   On: 04/22/2019 14:16    Medications: I have reviewed the patient's current medications.  Assessment: Hemorrhagic shock from active extravasation/hematoma with necrotizing pancreatitis Successful peripheral left gastric artery micro coil and Gelfoam embolization at active bleeding site by IR Status post 3 units PRBC and 2 units FFP transfusion since yesterday morning Remains tachycardic but hypotension has resolved and hemoglobin has improved to 7.9 Marked leukocytosis of 25.9, improved from yesterday, 53.3 Hypokalemia, mild acidosis Proteus mirabilis noted in blood culture from 04/17/2019 Enterobacteriaceae noted in blood culture from 04/17/2019  Chronic calcific pancreatitis , pancreatic pseudocyst, possible abscess formation, necrotizing pancreatitis Nonocclusive portal vein thrombosis, Eliquis on  hold since yesterday  Plan: Start clear liquid diet enhance early enteral feeding IV imipenem may be a better choice than IV aztreonam Relatively stable hemodynamically Patient not a candidate for anticoagulation, given recent life-threatening hemorrhagic shock Patient's prognosis remains guarded Will need repeat imaging study in 7 to 10 days, depending upon clinical course.   Kerin Salen 04/23/2019, 9:40 AM   Pager [phone] If no answer or after 5 PM call [phone]

## 2019-04-23 NOTE — Progress Notes (Signed)
Subjective/Chief Complaint: S/p embolization of l gastric artery branch yesterday. hgb came up to 8 and last check this am stable. Remarkably, she is not intubated or on pressors and is on a dilt gtt. She is confused this AM   Objective: Vital signs in last 24 hours: Temp:  [97.4 F (36.3 C)-99.1 F (37.3 C)] 97.6 F (36.4 C) (05/10 0400) Pulse Rate:  [97-129] 108 (05/10 0700) Resp:  [20-47] 31 (05/10 0700) BP: (64-141)/(48-92) 125/66 (05/10 0700) SpO2:  [94 %-100 %] 98 % (05/10 0700) Weight:  [46.6 kg] 46.6 kg (05/10 0500) Last BM Date: 04/21/19  Intake/Output from previous day: 05/09 0701 - 05/10 0700 In: 2115.9 [I.V.:78; Blood:1737.9; IV Piggyback:300] Out: 1775 [Urine:1775] Intake/Output this shift: No intake/output data recorded.  Cachectic and chronically ill appearing Somnolent, awakens to voice, speech not intelligible Unlabored respirations RRR Abdomen distended, tender   Lab Results:  Recent Labs    04/23/19 0243 04/23/19 0523  WBC 27.5* 25.9*  HGB 8.2* 7.9*  HCT 24.4* 22.6*  PLT 484* 434*   BMET Recent Labs    04/23/19 0243 04/23/19 0523  NA 139 139  K 3.1* 3.2*  CL 106 105  CO2 23 24  GLUCOSE 186* 159*  BUN 21 21  CREATININE 0.49 0.46  CALCIUM 7.6* 7.6*   PT/INR Recent Labs    04/22/19 0537 04/22/19 1327  LABPROT 22.9* 20.9*  INR 2.1* 1.8*   ABG No results for input(s): PHART, HCO3 in the last 72 hours.  Invalid input(s): PCO2, PO2  Studies/Results: Ir Angiogram Visceral Selective  Result Date: 04/22/2019 INDICATION: Acute left upper quadrant hemorrhage by CTA resulting in hemorrhagic shock. Complication of pancreatitis. EXAM: Ultrasound guidance for vascular access Celiac, splenic, and left gastric catheterizations and angiograms Peripheral left gastric branch micro catheterization, angiograms, and micro coil/Gelfoam embolization for active arterial bleeding. MEDICATIONS: 1% lidocaine local. ANESTHESIA/SEDATION: Moderate (conscious)  sedation was employed during this procedure. A total of Versed 1.0 mg and Fentanyl 75 mcg was administered intravenously. Moderate Sedation Time: 70 minutes. The patient's level of consciousness and vital signs were monitored continuously by radiology nursing throughout the procedure under my direct supervision. CONTRAST:  125 cc FLUOROSCOPY TIME:  Fluoroscopy Time: 12 minutes 42 seconds (1,643 mGy). COMPLICATIONS: None immediate. PROCEDURE: Informed consent was obtained from the patient following explanation of the procedure, risks, benefits and alternatives. The patient understands, agrees and consents for the procedure. All questions were addressed. A time out was performed prior to the initiation of the procedure. Maximal barrier sterile technique utilized including caps, mask, sterile gowns, sterile gloves, large sterile drape, hand hygiene, and Betadine prep. Under sterile conditions and local anesthesia, ultrasound micropuncture access performed of the right common femoral artery. Ultrasound images obtained for documentation of the right femoral artery access. Five French sheath inserted. Five French C2 catheter utilized to select the celiac origin. Celiac angiogram performed. Celiac: The celiac origin is widely patent. The left gastric, splenic and hepatic vasculature all patent. There is an accessory left hepatic artery off the left gastric. Active contrast extravasation/bleeding is present from peripheral small in the left upper quadrant either from the splenic or left gastric branches. This active bleeding site correlates with the recent CTA. C2 catheter was advanced over a Glidewire into the splenic artery. Splenic angiogram performed. Splenic: Initial splenic angiogram suggests active bleeding from a small pancreaticoduodenal branch from the main splenic artery peripherally. Renegade STC microcatheter advanced further peripheral in the splenic artery. Additional splenic angiogram performed. This failed  to demonstrate  the active bleeding branch in the left upper quadrant from the splenic artery. Microcatheter removed. Repeat angiography performed of the celiac artery through the C2 catheter. Active bleeding still demonstrated, suspect now from peripheral left gastric branches. Renegade STC microcatheter was advanced into the left gastric artery over a double angled Glidewire. Peripheral left gastric angiogram performed. Left gastric: Peripheral small branches of the left gastric artery demonstrate active bleeding in the left upper quadrant correlating with the CTA. Left gastric artery peripheral branch catheterization, angiograms, and micro coil/Gelfoam embolization: Initially the microcatheter was advanced into a medial peripheral branch. Contrast injection failed to demonstrate the active bleeding source. Next, the microcatheter was retracted and advanced into a more lateral left gastric peripheral branch. This confirms the active bleeding source. For embolization: 2 x 4 standard and soft interlock coils and 3 x 6 standard and 3 x 10 soft interlock coils were deployed into the peripheral left gastric branch at the bleeding site. Following micro coil embolization, repeat left gastric angiogram confirms near complete stasis. With delayed imaging, there is still some small peripheral left gastric branches with collateral supply to the bleeding site. Because of this, approximately 5 cc of Gel-Foam slurry was slowly instilled into the left gastric vascular territory at the bleeding site. Following micro coil and Gelfoam embolization, the left gastric vascular territory at the bleeding site demonstrates complete stasis. No further active bleeding. Microcatheter removed. Repeat angiogram through the C2 catheter demonstrates no additional source of bleeding. Access removed. Hemostasis obtained with the ExoSeal device. No immediate complication. Patient tolerated the procedure well. IMPRESSION: Successful peripheral left  gastric artery micro coil and Gelfoam embolization at the active bleeding site which correlated with the CTA. Electronically Signed   By: Judie Petit.  Shick M.D.   On: 04/22/2019 17:48   Ir Angiogram Selective Each Additional Vessel  Result Date: 04/22/2019 INDICATION: Acute left upper quadrant hemorrhage by CTA resulting in hemorrhagic shock. Complication of pancreatitis. EXAM: Ultrasound guidance for vascular access Celiac, splenic, and left gastric catheterizations and angiograms Peripheral left gastric branch micro catheterization, angiograms, and micro coil/Gelfoam embolization for active arterial bleeding. MEDICATIONS: 1% lidocaine local. ANESTHESIA/SEDATION: Moderate (conscious) sedation was employed during this procedure. A total of Versed 1.0 mg and Fentanyl 75 mcg was administered intravenously. Moderate Sedation Time: 70 minutes. The patient's level of consciousness and vital signs were monitored continuously by radiology nursing throughout the procedure under my direct supervision. CONTRAST:  125 cc FLUOROSCOPY TIME:  Fluoroscopy Time: 12 minutes 42 seconds (1,643 mGy). COMPLICATIONS: None immediate. PROCEDURE: Informed consent was obtained from the patient following explanation of the procedure, risks, benefits and alternatives. The patient understands, agrees and consents for the procedure. All questions were addressed. A time out was performed prior to the initiation of the procedure. Maximal barrier sterile technique utilized including caps, mask, sterile gowns, sterile gloves, large sterile drape, hand hygiene, and Betadine prep. Under sterile conditions and local anesthesia, ultrasound micropuncture access performed of the right common femoral artery. Ultrasound images obtained for documentation of the right femoral artery access. Five French sheath inserted. Five French C2 catheter utilized to select the celiac origin. Celiac angiogram performed. Celiac: The celiac origin is widely patent. The left  gastric, splenic and hepatic vasculature all patent. There is an accessory left hepatic artery off the left gastric. Active contrast extravasation/bleeding is present from peripheral small in the left upper quadrant either from the splenic or left gastric branches. This active bleeding site correlates with the recent CTA. C2 catheter was advanced over  a Glidewire into the splenic artery. Splenic angiogram performed. Splenic: Initial splenic angiogram suggests active bleeding from a small pancreaticoduodenal branch from the main splenic artery peripherally. Renegade STC microcatheter advanced further peripheral in the splenic artery. Additional splenic angiogram performed. This failed to demonstrate the active bleeding branch in the left upper quadrant from the splenic artery. Microcatheter removed. Repeat angiography performed of the celiac artery through the C2 catheter. Active bleeding still demonstrated, suspect now from peripheral left gastric branches. Renegade STC microcatheter was advanced into the left gastric artery over a double angled Glidewire. Peripheral left gastric angiogram performed. Left gastric: Peripheral small branches of the left gastric artery demonstrate active bleeding in the left upper quadrant correlating with the CTA. Left gastric artery peripheral branch catheterization, angiograms, and micro coil/Gelfoam embolization: Initially the microcatheter was advanced into a medial peripheral branch. Contrast injection failed to demonstrate the active bleeding source. Next, the microcatheter was retracted and advanced into a more lateral left gastric peripheral branch. This confirms the active bleeding source. For embolization: 2 x 4 standard and soft interlock coils and 3 x 6 standard and 3 x 10 soft interlock coils were deployed into the peripheral left gastric branch at the bleeding site. Following micro coil embolization, repeat left gastric angiogram confirms near complete stasis. With  delayed imaging, there is still some small peripheral left gastric branches with collateral supply to the bleeding site. Because of this, approximately 5 cc of Gel-Foam slurry was slowly instilled into the left gastric vascular territory at the bleeding site. Following micro coil and Gelfoam embolization, the left gastric vascular territory at the bleeding site demonstrates complete stasis. No further active bleeding. Microcatheter removed. Repeat angiogram through the C2 catheter demonstrates no additional source of bleeding. Access removed. Hemostasis obtained with the ExoSeal device. No immediate complication. Patient tolerated the procedure well. IMPRESSION: Successful peripheral left gastric artery micro coil and Gelfoam embolization at the active bleeding site which correlated with the CTA. Electronically Signed   By: Judie Petit.  Shick M.D.   On: 04/22/2019 17:48   Ir Angiogram Selective Each Additional Vessel  Result Date: 04/22/2019 INDICATION: Acute left upper quadrant hemorrhage by CTA resulting in hemorrhagic shock. Complication of pancreatitis. EXAM: Ultrasound guidance for vascular access Celiac, splenic, and left gastric catheterizations and angiograms Peripheral left gastric branch micro catheterization, angiograms, and micro coil/Gelfoam embolization for active arterial bleeding. MEDICATIONS: 1% lidocaine local. ANESTHESIA/SEDATION: Moderate (conscious) sedation was employed during this procedure. A total of Versed 1.0 mg and Fentanyl 75 mcg was administered intravenously. Moderate Sedation Time: 70 minutes. The patient's level of consciousness and vital signs were monitored continuously by radiology nursing throughout the procedure under my direct supervision. CONTRAST:  125 cc FLUOROSCOPY TIME:  Fluoroscopy Time: 12 minutes 42 seconds (1,643 mGy). COMPLICATIONS: None immediate. PROCEDURE: Informed consent was obtained from the patient following explanation of the procedure, risks, benefits and  alternatives. The patient understands, agrees and consents for the procedure. All questions were addressed. A time out was performed prior to the initiation of the procedure. Maximal barrier sterile technique utilized including caps, mask, sterile gowns, sterile gloves, large sterile drape, hand hygiene, and Betadine prep. Under sterile conditions and local anesthesia, ultrasound micropuncture access performed of the right common femoral artery. Ultrasound images obtained for documentation of the right femoral artery access. Five French sheath inserted. Five French C2 catheter utilized to select the celiac origin. Celiac angiogram performed. Celiac: The celiac origin is widely patent. The left gastric, splenic and hepatic vasculature all patent. There  is an accessory left hepatic artery off the left gastric. Active contrast extravasation/bleeding is present from peripheral small in the left upper quadrant either from the splenic or left gastric branches. This active bleeding site correlates with the recent CTA. C2 catheter was advanced over a Glidewire into the splenic artery. Splenic angiogram performed. Splenic: Initial splenic angiogram suggests active bleeding from a small pancreaticoduodenal branch from the main splenic artery peripherally. Renegade STC microcatheter advanced further peripheral in the splenic artery. Additional splenic angiogram performed. This failed to demonstrate the active bleeding branch in the left upper quadrant from the splenic artery. Microcatheter removed. Repeat angiography performed of the celiac artery through the C2 catheter. Active bleeding still demonstrated, suspect now from peripheral left gastric branches. Renegade STC microcatheter was advanced into the left gastric artery over a double angled Glidewire. Peripheral left gastric angiogram performed. Left gastric: Peripheral small branches of the left gastric artery demonstrate active bleeding in the left upper quadrant  correlating with the CTA. Left gastric artery peripheral branch catheterization, angiograms, and micro coil/Gelfoam embolization: Initially the microcatheter was advanced into a medial peripheral branch. Contrast injection failed to demonstrate the active bleeding source. Next, the microcatheter was retracted and advanced into a more lateral left gastric peripheral branch. This confirms the active bleeding source. For embolization: 2 x 4 standard and soft interlock coils and 3 x 6 standard and 3 x 10 soft interlock coils were deployed into the peripheral left gastric branch at the bleeding site. Following micro coil embolization, repeat left gastric angiogram confirms near complete stasis. With delayed imaging, there is still some small peripheral left gastric branches with collateral supply to the bleeding site. Because of this, approximately 5 cc of Gel-Foam slurry was slowly instilled into the left gastric vascular territory at the bleeding site. Following micro coil and Gelfoam embolization, the left gastric vascular territory at the bleeding site demonstrates complete stasis. No further active bleeding. Microcatheter removed. Repeat angiogram through the C2 catheter demonstrates no additional source of bleeding. Access removed. Hemostasis obtained with the ExoSeal device. No immediate complication. Patient tolerated the procedure well. IMPRESSION: Successful peripheral left gastric artery micro coil and Gelfoam embolization at the active bleeding site which correlated with the CTA. Electronically Signed   By: Judie Petit.  Shick M.D.   On: 04/22/2019 17:48   Ir Angiogram Selective Each Additional Vessel  Result Date: 04/22/2019 INDICATION: Acute left upper quadrant hemorrhage by CTA resulting in hemorrhagic shock. Complication of pancreatitis. EXAM: Ultrasound guidance for vascular access Celiac, splenic, and left gastric catheterizations and angiograms Peripheral left gastric branch micro catheterization, angiograms,  and micro coil/Gelfoam embolization for active arterial bleeding. MEDICATIONS: 1% lidocaine local. ANESTHESIA/SEDATION: Moderate (conscious) sedation was employed during this procedure. A total of Versed 1.0 mg and Fentanyl 75 mcg was administered intravenously. Moderate Sedation Time: 70 minutes. The patient's level of consciousness and vital signs were monitored continuously by radiology nursing throughout the procedure under my direct supervision. CONTRAST:  125 cc FLUOROSCOPY TIME:  Fluoroscopy Time: 12 minutes 42 seconds (1,643 mGy). COMPLICATIONS: None immediate. PROCEDURE: Informed consent was obtained from the patient following explanation of the procedure, risks, benefits and alternatives. The patient understands, agrees and consents for the procedure. All questions were addressed. A time out was performed prior to the initiation of the procedure. Maximal barrier sterile technique utilized including caps, mask, sterile gowns, sterile gloves, large sterile drape, hand hygiene, and Betadine prep. Under sterile conditions and local anesthesia, ultrasound micropuncture access performed of the right common femoral artery.  Ultrasound images obtained for documentation of the right femoral artery access. Five French sheath inserted. Five French C2 catheter utilized to select the celiac origin. Celiac angiogram performed. Celiac: The celiac origin is widely patent. The left gastric, splenic and hepatic vasculature all patent. There is an accessory left hepatic artery off the left gastric. Active contrast extravasation/bleeding is present from peripheral small in the left upper quadrant either from the splenic or left gastric branches. This active bleeding site correlates with the recent CTA. C2 catheter was advanced over a Glidewire into the splenic artery. Splenic angiogram performed. Splenic: Initial splenic angiogram suggests active bleeding from a small pancreaticoduodenal branch from the main splenic artery  peripherally. Renegade STC microcatheter advanced further peripheral in the splenic artery. Additional splenic angiogram performed. This failed to demonstrate the active bleeding branch in the left upper quadrant from the splenic artery. Microcatheter removed. Repeat angiography performed of the celiac artery through the C2 catheter. Active bleeding still demonstrated, suspect now from peripheral left gastric branches. Renegade STC microcatheter was advanced into the left gastric artery over a double angled Glidewire. Peripheral left gastric angiogram performed. Left gastric: Peripheral small branches of the left gastric artery demonstrate active bleeding in the left upper quadrant correlating with the CTA. Left gastric artery peripheral branch catheterization, angiograms, and micro coil/Gelfoam embolization: Initially the microcatheter was advanced into a medial peripheral branch. Contrast injection failed to demonstrate the active bleeding source. Next, the microcatheter was retracted and advanced into a more lateral left gastric peripheral branch. This confirms the active bleeding source. For embolization: 2 x 4 standard and soft interlock coils and 3 x 6 standard and 3 x 10 soft interlock coils were deployed into the peripheral left gastric branch at the bleeding site. Following micro coil embolization, repeat left gastric angiogram confirms near complete stasis. With delayed imaging, there is still some small peripheral left gastric branches with collateral supply to the bleeding site. Because of this, approximately 5 cc of Gel-Foam slurry was slowly instilled into the left gastric vascular territory at the bleeding site. Following micro coil and Gelfoam embolization, the left gastric vascular territory at the bleeding site demonstrates complete stasis. No further active bleeding. Microcatheter removed. Repeat angiogram through the C2 catheter demonstrates no additional source of bleeding. Access removed.  Hemostasis obtained with the ExoSeal device. No immediate complication. Patient tolerated the procedure well. IMPRESSION: Successful peripheral left gastric artery micro coil and Gelfoam embolization at the active bleeding site which correlated with the CTA. Electronically Signed   By: Judie Petit.  Shick M.D.   On: 04/22/2019 17:48   Ir Angiogram Selective Each Additional Vessel  Result Date: 04/22/2019 INDICATION: Acute left upper quadrant hemorrhage by CTA resulting in hemorrhagic shock. Complication of pancreatitis. EXAM: Ultrasound guidance for vascular access Celiac, splenic, and left gastric catheterizations and angiograms Peripheral left gastric branch micro catheterization, angiograms, and micro coil/Gelfoam embolization for active arterial bleeding. MEDICATIONS: 1% lidocaine local. ANESTHESIA/SEDATION: Moderate (conscious) sedation was employed during this procedure. A total of Versed 1.0 mg and Fentanyl 75 mcg was administered intravenously. Moderate Sedation Time: 70 minutes. The patient's level of consciousness and vital signs were monitored continuously by radiology nursing throughout the procedure under my direct supervision. CONTRAST:  125 cc FLUOROSCOPY TIME:  Fluoroscopy Time: 12 minutes 42 seconds (1,643 mGy). COMPLICATIONS: None immediate. PROCEDURE: Informed consent was obtained from the patient following explanation of the procedure, risks, benefits and alternatives. The patient understands, agrees and consents for the procedure. All questions were addressed. A time out  was performed prior to the initiation of the procedure. Maximal barrier sterile technique utilized including caps, mask, sterile gowns, sterile gloves, large sterile drape, hand hygiene, and Betadine prep. Under sterile conditions and local anesthesia, ultrasound micropuncture access performed of the right common femoral artery. Ultrasound images obtained for documentation of the right femoral artery access. Five French sheath  inserted. Five French C2 catheter utilized to select the celiac origin. Celiac angiogram performed. Celiac: The celiac origin is widely patent. The left gastric, splenic and hepatic vasculature all patent. There is an accessory left hepatic artery off the left gastric. Active contrast extravasation/bleeding is present from peripheral small in the left upper quadrant either from the splenic or left gastric branches. This active bleeding site correlates with the recent CTA. C2 catheter was advanced over a Glidewire into the splenic artery. Splenic angiogram performed. Splenic: Initial splenic angiogram suggests active bleeding from a small pancreaticoduodenal branch from the main splenic artery peripherally. Renegade STC microcatheter advanced further peripheral in the splenic artery. Additional splenic angiogram performed. This failed to demonstrate the active bleeding branch in the left upper quadrant from the splenic artery. Microcatheter removed. Repeat angiography performed of the celiac artery through the C2 catheter. Active bleeding still demonstrated, suspect now from peripheral left gastric branches. Renegade STC microcatheter was advanced into the left gastric artery over a double angled Glidewire. Peripheral left gastric angiogram performed. Left gastric: Peripheral small branches of the left gastric artery demonstrate active bleeding in the left upper quadrant correlating with the CTA. Left gastric artery peripheral branch catheterization, angiograms, and micro coil/Gelfoam embolization: Initially the microcatheter was advanced into a medial peripheral branch. Contrast injection failed to demonstrate the active bleeding source. Next, the microcatheter was retracted and advanced into a more lateral left gastric peripheral branch. This confirms the active bleeding source. For embolization: 2 x 4 standard and soft interlock coils and 3 x 6 standard and 3 x 10 soft interlock coils were deployed into the  peripheral left gastric branch at the bleeding site. Following micro coil embolization, repeat left gastric angiogram confirms near complete stasis. With delayed imaging, there is still some small peripheral left gastric branches with collateral supply to the bleeding site. Because of this, approximately 5 cc of Gel-Foam slurry was slowly instilled into the left gastric vascular territory at the bleeding site. Following micro coil and Gelfoam embolization, the left gastric vascular territory at the bleeding site demonstrates complete stasis. No further active bleeding. Microcatheter removed. Repeat angiogram through the C2 catheter demonstrates no additional source of bleeding. Access removed. Hemostasis obtained with the ExoSeal device. No immediate complication. Patient tolerated the procedure well. IMPRESSION: Successful peripheral left gastric artery micro coil and Gelfoam embolization at the active bleeding site which correlated with the CTA. Electronically Signed   By: Judie Petit.  Shick M.D.   On: 04/22/2019 17:48   Ir US Guide Vasc Access Right  Result Date: 04/22/2019 INDICATION: Acute left upper quadrant hemorrhage by CTA resulting in hemorrhagic shock. Complication of pancreatitis. EXAM: Ultrasound guidance for vascular access Celiac, splenic, and left gastric catheterizations and angiograms Peripheral left gastric branch micro catheterization, angiograms, and micro coil/Gelfoam embolization for active arterial bleeding. MEDICATIONS: 1% lidocaine local. ANESTHESIA/SEDATION: Moderate (conscious) sedation was employed during this procedure. A total of Versed 1.0 mg and Fentanyl 75 mcg was administered intravenously. Moderate Sedation Time: 70 minutes. The patient's level of consciousness and vital signs were monitored continuously by radiology nursing throughout the procedure under my direct supervision. CONTRAST:  125 cc FLUOROSCOPY TIME:  Fluoroscopy Time: 12 minutes 42 seconds (1,643 mGy). COMPLICATIONS: None  immediate. PROCEDURE: Informed consent was obtained from the patient following explanation of the procedure, risks, benefits and alternatives. The patient understands, agrees and consents for the procedure. All questions were addressed. A time out was performed prior to the initiation of the procedure. Maximal barrier sterile technique utilized including caps, mask, sterile gowns, sterile gloves, large sterile drape, hand hygiene, and Betadine prep. Under sterile conditions and local anesthesia, ultrasound micropuncture access performed of the right common femoral artery. Ultrasound images obtained for documentation of the right femoral artery access. Five French sheath inserted. Five French C2 catheter utilized to select the celiac origin. Celiac angiogram performed. Celiac: The celiac origin is widely patent. The left gastric, splenic and hepatic vasculature all patent. There is an accessory left hepatic artery off the left gastric. Active contrast extravasation/bleeding is present from peripheral small in the left upper quadrant either from the splenic or left gastric branches. This active bleeding site correlates with the recent CTA. C2 catheter was advanced over a Glidewire into the splenic artery. Splenic angiogram performed. Splenic: Initial splenic angiogram suggests active bleeding from a small pancreaticoduodenal branch from the main splenic artery peripherally. Renegade STC microcatheter advanced further peripheral in the splenic artery. Additional splenic angiogram performed. This failed to demonstrate the active bleeding branch in the left upper quadrant from the splenic artery. Microcatheter removed. Repeat angiography performed of the celiac artery through the C2 catheter. Active bleeding still demonstrated, suspect now from peripheral left gastric branches. Renegade STC microcatheter was advanced into the left gastric artery over a double angled Glidewire. Peripheral left gastric angiogram performed.  Left gastric: Peripheral small branches of the left gastric artery demonstrate active bleeding in the left upper quadrant correlating with the CTA. Left gastric artery peripheral branch catheterization, angiograms, and micro coil/Gelfoam embolization: Initially the microcatheter was advanced into a medial peripheral branch. Contrast injection failed to demonstrate the active bleeding source. Next, the microcatheter was retracted and advanced into a more lateral left gastric peripheral branch. This confirms the active bleeding source. For embolization: 2 x 4 standard and soft interlock coils and 3 x 6 standard and 3 x 10 soft interlock coils were deployed into the peripheral left gastric branch at the bleeding site. Following micro coil embolization, repeat left gastric angiogram confirms near complete stasis. With delayed imaging, there is still some small peripheral left gastric branches with collateral supply to the bleeding site. Because of this, approximately 5 cc of Gel-Foam slurry was slowly instilled into the left gastric vascular territory at the bleeding site. Following micro coil and Gelfoam embolization, the left gastric vascular territory at the bleeding site demonstrates complete stasis. No further active bleeding. Microcatheter removed. Repeat angiogram through the C2 catheter demonstrates no additional source of bleeding. Access removed. Hemostasis obtained with the ExoSeal device. No immediate complication. Patient tolerated the procedure well. IMPRESSION: Successful peripheral left gastric artery micro coil and Gelfoam embolization at the active bleeding site which correlated with the CTA. Electronically Signed   By: Judie Petit.  Shick M.D.   On: 04/22/2019 17:48   Dg Chest Port 1 View  Result Date: 04/22/2019 CLINICAL DATA:  Central line placement. EXAM: PORTABLE CHEST 1 VIEW COMPARISON:  04/20/2011 FINDINGS: A LEFT subclavian central venous catheter is noted with tip overlying the LOWER SVC.  Cardiomediastinal silhouette is unchanged. LEFT LOWER lung consolidation/atelectasis/pleural effusion again noted. There is no evidence of pneumothorax. IMPRESSION: LEFT subclavian central venous catheter placement with tip overlying the LOWER  SVC. No pneumothorax. Little significant change of LEFT LOWER lung consolidation/atelectasis/pleural effusion. Electronically Signed   By: Harmon Pier M.D.   On: 04/22/2019 11:44   Dg Abd 2 Views  Result Date: 04/22/2019 CLINICAL DATA:  Acute right-sided abdominal pain. EXAM: ABDOMEN - 2 VIEW COMPARISON:  CT scan of Apr 20, 2019. Radiographs of October 18, 2018. FINDINGS: Mildly dilated small bowel loops are noted in the left lower quadrant which may represent ileus or possibly obstruction. No free air is noted to suggest pneumoperitoneum. Atherosclerosis of abdominal aorta is noted. Status post cholecystectomy. No significant colonic dilatation is noted. IMPRESSION: Mildly dilated small bowel loops are noted in the left lower quadrant of the abdomen which may represent ileus or possibly obstruction. Aortic Atherosclerosis (ICD10-I70.0). Electronically Signed   By: Lupita Raider M.D.   On: 04/22/2019 08:41   Ir Embo Arterial Not Hemorr [employer] Guide Roadmapping  Result Date: 04/22/2019 INDICATION: Acute left upper quadrant hemorrhage by CTA resulting in hemorrhagic shock. Complication of pancreatitis. EXAM: Ultrasound guidance for vascular access Celiac, splenic, and left gastric catheterizations and angiograms Peripheral left gastric branch micro catheterization, angiograms, and micro coil/Gelfoam embolization for active arterial bleeding. MEDICATIONS: 1% lidocaine local. ANESTHESIA/SEDATION: Moderate (conscious) sedation was employed during this procedure. A total of Versed 1.0 mg and Fentanyl 75 mcg was administered intravenously. Moderate Sedation Time: 70 minutes. The patient's level of consciousness and vital signs were monitored continuously by radiology  nursing throughout the procedure under my direct supervision. CONTRAST:  125 cc FLUOROSCOPY TIME:  Fluoroscopy Time: 12 minutes 42 seconds (1,643 mGy). COMPLICATIONS: None immediate. PROCEDURE: Informed consent was obtained from the patient following explanation of the procedure, risks, benefits and alternatives. The patient understands, agrees and consents for the procedure. All questions were addressed. A time out was performed prior to the initiation of the procedure. Maximal barrier sterile technique utilized including caps, mask, sterile gowns, sterile gloves, large sterile drape, hand hygiene, and Betadine prep. Under sterile conditions and local anesthesia, ultrasound micropuncture access performed of the right common femoral artery. Ultrasound images obtained for documentation of the right femoral artery access. Five French sheath inserted. Five French C2 catheter utilized to select the celiac origin. Celiac angiogram performed. Celiac: The celiac origin is widely patent. The left gastric, splenic and hepatic vasculature all patent. There is an accessory left hepatic artery off the left gastric. Active contrast extravasation/bleeding is present from peripheral small in the left upper quadrant either from the splenic or left gastric branches. This active bleeding site correlates with the recent CTA. C2 catheter was advanced over a Glidewire into the splenic artery. Splenic angiogram performed. Splenic: Initial splenic angiogram suggests active bleeding from a small pancreaticoduodenal branch from the main splenic artery peripherally. Renegade STC microcatheter advanced further peripheral in the splenic artery. Additional splenic angiogram performed. This failed to demonstrate the active bleeding branch in the left upper quadrant from the splenic artery. Microcatheter removed. Repeat angiography performed of the celiac artery through the C2 catheter. Active bleeding still demonstrated, suspect now from  peripheral left gastric branches. Renegade STC microcatheter was advanced into the left gastric artery over a double angled Glidewire. Peripheral left gastric angiogram performed. Left gastric: Peripheral small branches of the left gastric artery demonstrate active bleeding in the left upper quadrant correlating with the CTA. Left gastric artery peripheral branch catheterization, angiograms, and micro coil/Gelfoam embolization: Initially the microcatheter was advanced into a medial peripheral branch. Contrast injection failed to demonstrate the active bleeding source. Next, the  microcatheter was retracted and advanced into a more lateral left gastric peripheral branch. This confirms the active bleeding source. For embolization: 2 x 4 standard and soft interlock coils and 3 x 6 standard and 3 x 10 soft interlock coils were deployed into the peripheral left gastric branch at the bleeding site. Following micro coil embolization, repeat left gastric angiogram confirms near complete stasis. With delayed imaging, there is still some small peripheral left gastric branches with collateral supply to the bleeding site. Because of this, approximately 5 cc of Gel-Foam slurry was slowly instilled into the left gastric vascular territory at the bleeding site. Following micro coil and Gelfoam embolization, the left gastric vascular territory at the bleeding site demonstrates complete stasis. No further active bleeding. Microcatheter removed. Repeat angiogram through the C2 catheter demonstrates no additional source of bleeding. Access removed. Hemostasis obtained with the ExoSeal device. No immediate complication. Patient tolerated the procedure well. IMPRESSION: Successful peripheral left gastric artery micro coil and Gelfoam embolization at the active bleeding site which correlated with the CTA. Electronically Signed   By: Judie PetitM.  Shick M.D.   On: 04/22/2019 17:48   Ct Angio Abd/pel W/ And/or W/o  Result Date: 04/22/2019 CLINICAL  DATA:  73 year old female with a history of necrotizing pancreatitis and hypotension EXAM: CTA ABDOMEN AND PELVIS wITHOUT AND WITH CONTRAST TECHNIQUE: Multidetector CT imaging of the abdomen and pelvis was performed using the standard protocol during bolus administration of intravenous contrast. Multiplanar reconstructed images and MIPs were obtained and reviewed to evaluate the vascular anatomy. CONTRAST:  100mL OMNIPAQUE IOHEXOL 350 MG/ML SOLN COMPARISON:  04/20/2019, 03/21/2019 FINDINGS: VASCULAR Aorta: Atherosclerosis of the thoracic aorta. Greatest diameter of the aorta at the aortic hiatus measures 2.4 cm. Atherosclerosis of the abdominal aorta. Diameter of infrarenal aorta just below the renal artery origins measures 2.4 cm. Celiac: Celiac artery patent with mild atherosclerotic changes. Celiac artery contributes to common hepatic artery, left gastric artery, splenic artery. There is a replaced left hepatic artery. There is presumed origin of the extravasated contrast into the new hematoma from left gastric branches, however, no direct communication is identified on the CT. SMA: Atherosclerotic changes of the SMA origin. Renals: Atherosclerotic changes at the bilateral renal artery origins. Estimated 50% narrowing on the right. No high-grade stenosis on the left. IMA: IMA patent. Right lower extremity: Moderate atherosclerotic changes of the right common iliac and external iliac artery. Hypogastric artery is patent. Dense calcifications of the right common femoral artery. Left lower extremity: Moderate to advanced atherosclerotic changes of the left iliac system including common iliac and external iliac artery. Hypogastric artery is patent. Common femoral artery with atherosclerotic calcifications. Veins: Unremarkable IVC and bilateral renal veins. Bilateral iliac veins patent. Redemonstration of thrombus at the spleno portal confluence with patency of the superior mesenteric vein. Splenic vein appears  occluded. Multiple collateral draining veins of the left upper quadrant secondary to splenic vein occlusion, compatible with portal-portal and portal-systemic collateral veins. The portal-systemic collateral veins contribute to engorgement of the bilateral adnexal veins and the left gonadal vein. Review of the MIP images confirms the above findings. NON-VASCULAR Lower chest: Bilateral pleural effusions with associated atelectasis. Hepatobiliary: The enhancement/attenuation of the liver is uniform on the delayed images, with mottled appearance on the arterial images, reflecting delayed perfusion secondary to portal vein compromise. Cholecystectomy. Pancreas: Coarse calcifications throughout the pancreatic parenchyma. Pancreas is compressed by expanding hematoma/hemorrhage interposed between stomach and the pancreas. There is been significant enlargement in the interval, with the prior CT dated  04/19/2021 measuring hematoma 3.2 cm. Hematoma now measures 12.6 cm x 7.2 cm. Linear hyperdense focus at the superior margin of the hematoma on image 38 of series 15 is not present on the noncontrast series, compatible extravasated contrast/hemorrhage. Pancreatic parenchyma at the pancreas neck demonstrates mottled enhancement/attenuation. Spleen: Unremarkable Adrenals/Urinary Tract: Adenoma of the left adrenal gland. Right: No hydronephrosis. Symmetric perfusion to the left. No nephrolithiasis. Unremarkable course of the right ureter. Left: No hydronephrosis. Symmetric perfusion to the right. No nephrolithiasis. Unremarkable course of the left ureter. Circumferential wall thickening of the urinary bladder which is partially distended. Stomach/Bowel: Stomach has been compressed by hematoma at the posterior wall. Borderline dilated small bowel throughout the abdomen. Wall enhancement appears within normal limits. No transition point identified. Appendix is not visualized, however, no inflammatory changes are present adjacent to  the cecum to indicate an appendicitis. Colonic diverticula. Colon is relatively decompressed. Lymphatic: Multiple lymph nodes of the gastrohepatic ligament and liver hilum, as well as at the portacaval nodal stations, periaortic/preaortic nodal stations. Small lymph nodes of the inguinal region and the bilateral iliac nodal stations. Mesenteric: Interval increasing intermediate density fluid throughout the abdomen, right abdomen, left abdomen, and pelvis. Stranding within the mesentery. Reproductive: Fibroid uterus. Other: No hernia. Musculoskeletal: No acute displaced fracture. Surgical changes of the left hip. IMPRESSION: The CT angiogram is positive for active extravasation/hemorrhage into previously demonstrated peripancreatic hematoma related to necrotizing pancreatitis. Significant increased hematoma since the comparison CT, which now measures at least 12.6 cm in greatest diameter. These results were discussed by telephone at the time of interpretation on 04/22/2019 at 1:45pm with Dr. Rhetta Mura Increased free peritoneal fluid, compatible with peritoneal hemorrhage. Redemonstration of necrotizing pancreatitis superimposed on changes of chronic pancreatitis. This is the favored etiology of multiple lymph nodes of the upper abdomen. Redemonstration of portal vein thrombus at the spleno portal confluence with occluded splenic vein. The splenic vein occlusion contributes to multiple collateral draining veins of the left upper quadrant, as above. Aortic atherosclerosis, mesenteric arterial disease, bilateral renal arterial disease, and bilateral iliofemoral disease. Aortic Atherosclerosis (ICD10-I70.0). Partially distended small bowel, potentially a developing ileus. Additional ancillary findings as above. Signed, Yvone Neu. Reyne Dumas, RPVI [facility] Electronically Signed   By: Gilmer Mor D.O.   On: 04/22/2019 14:16     Anti-infectives: Anti-infectives (From admission, onward)   Start     Dose/Rate Route Frequency Ordered Stop   04/18/19 1000  metroNIDAZOLE (FLAGYL) IVPB 500 mg  Status:  Discontinued     500 mg 100 mL/hr over 60 Minutes Intravenous Every 8 hours 04/18/19 0908 04/18/19 1048   04/18/19 1000  aztreonam (AZACTAM) 1 g in sodium chloride 0.9 % 100 mL IVPB  Status:  Discontinued     1 g 200 mL/hr over 30 Minutes Intravenous Every 8 hours 04/18/19 0914 04/18/19 0920   04/18/19 1000  aztreonam (AZACTAM) 2 g in sodium chloride 0.9 % 100 mL IVPB     2 g 200 mL/hr over 30 Minutes Intravenous Every 8 hours 04/18/19 0920     04/08/19 1130  ciprofloxacin (CIPRO) IVPB 400 mg  Status:  Discontinued     400 mg 200 mL/hr over 60 Minutes Intravenous Every 12 hours 04/08/19 1110 04/12/19 0821   04/08/19 0900  ciprofloxacin (CIPRO) IVPB 200 mg  Status:  Discontinued     200 mg 100 mL/hr over 60 Minutes Intravenous Every 12 hours 04/08/19 0805 04/08/19 1110   04/07/19 1000  aztreonam (AZACTAM)  2 g in sodium chloride 0.9 % 100 mL IVPB  Status:  Discontinued     2 g 200 mL/hr over 30 Minutes Intravenous Every 8 hours 04/07/19 0809 04/08/19 0805   04/07/19 0800  ciprofloxacin (CIPRO) IVPB 200 mg  Status:  Discontinued     200 mg 100 mL/hr over 60 Minutes Intravenous Every 12 hours 04/07/19 0754 04/07/19 0815   04/06/19 1030  aztreonam (AZACTAM) 2 g in sodium chloride 0.9 % 100 mL IVPB  Status:  Discontinued     2 g 200 mL/hr over 30 Minutes Intravenous Every 8 hours 04/06/19 1008 04/07/19 0754   04/06/19 0500  ciprofloxacin (CIPRO) IVPB 400 mg  Status:  Discontinued     400 mg 200 mL/hr over 60 Minutes Intravenous Every 12 hours 03/18/2019 2043 04/06/19 1008   04/06/19 0400  metroNIDAZOLE (FLAGYL) IVPB 500 mg  Status:  Discontinued     500 mg 100 mL/hr over 60 Minutes Intravenous Every 8 hours 03/27/2019 2031 04/06/19 0826   04/06/19 0200  aztreonam (AZACTAM) 1 g in sodium chloride 0.9 % 100 mL IVPB   Status:  Discontinued     1 g 200 mL/hr over 30 Minutes Intravenous Every 8 hours 03/23/2019 2043 04/06/19 0826   03/15/2019 1745  aztreonam (AZACTAM) 1 g in sodium chloride 0.9 % 100 mL IVPB     1 g 200 mL/hr over 30 Minutes Intravenous  Once 03/20/2019 1741 03/30/2019 1936   04/04/2019 1745  ciprofloxacin (CIPRO) IVPB 400 mg     400 mg 200 mL/hr over 60 Minutes Intravenous  Once 04/02/2019 1741 03/19/2019 2129   03/15/2019 1745  metroNIDAZOLE (FLAGYL) IVPB 500 mg     500 mg 100 mL/hr over 60 Minutes Intravenous  Once 03/28/2019 1741 04/10/2019 2037      Assessment/Plan:  severe malnutrition/ deconditioning, hepatic dysfunction/ hepatomegaly, alcohol abuse/ tobacco abuse, acute systolic heart failure with stress-induced cardiomyopathy complicated by acute cardiogenic pulmonary edema and acute hypoxic respiratory failure. UTI sepsis withProteus mirabilis and Enterobacter bacteremia.Hx fall with ORIF left femur fx last month  Hemorrhagic pancreatitis s/p angio and embolization of L gastric artery branch yesterday. Hemodynamically seems to be stable right now.  Prognosis remains guarded. Unfortunately there is not really anything that surgery can offer at this point. Will continue to follow peripherally.   LOS: 18 days    Berna Bue 04/23/2019

## 2019-04-23 NOTE — Progress Notes (Signed)
Palliative care progress note  Reason for consult: Goals of care in light of abscess/necrotic pancreatitis  Discussed with Dr. Mahala Menghini.  Chart reviewed for last 24 hours and discussed with bedside RN.    Patient reported being tired and needing to rest.  I called and was able to reach her daughter, April [name].    We discussed patient's clinical course since we last discussed including Hgb dropping again.  Reviewed lab results.  She reports good understanding of current situation.  I expressed concern that her mother may have irreversible bleeding.  She reports this is upsetting, but again stated she appreciates honesty surrounding situation.    - Full code/full scope treatment- We discussed need to discuss again in AM once better determine Hbg trend. -  PMT to continue to follow, progress conversation as possible, and support holistically.  Total time: 40 minutes Greater than 50%  of this time was spent counseling and coordinating care related to the above assessment and plan.  April Minus, MD [facility] Palliative Medicine Team [phone]

## 2019-04-23 NOTE — Progress Notes (Addendum)
Quick review  Further drop in hemoglobin 7.1 therefore meets transfusion trigger 1 more unit packed red blood cells Replace micronutrients potassium magnesium c d5/ns + K x 18 h INR has come back at 1.4 I will give vitamin K x1 5 mg IV and repeat these labs in a.m.  She is stabilized last time I checked heart rate was in the 90s to low 100s on Cardizem gtt. Appreciate in advance again palliative care input given complexity and likely very poor overall outcome despite heroic efforts yesterday   Pleas Koch, MD [facility] 5:00 PM

## 2019-04-23 NOTE — Progress Notes (Signed)
Evening nursing assessment remains as previously charted.  Will continue to monitor.

## 2019-04-23 NOTE — Progress Notes (Signed)
Patient ID: April [name], female   DOB: [birth year], 73 y.o.   MRN: [id number] IR round note via phone call with nurse:  Pt with hx hemorrhagic shock from active extrav/hematoma with necrotizing pancreatitis, bacteremia; s/p peripheral left gastric artery microcoil and Gelfoam embo 5/9  Pt awake, some intermittent confusion; has less abd pain; no further visible bleeding Afebrile, BP ok, remains tachy WBC 25.9(27.5), hgb 7.9(8.2), creat nl Rt groin site ok Plans as per CCM/TRH/GI

## 2019-04-23 NOTE — Progress Notes (Signed)
Discussed with Dr. Mahala Menghini  CCM will see as needed

## 2019-04-23 NOTE — Progress Notes (Signed)
PHARMACY - PHYSICIAN COMMUNICATION CRITICAL VALUE ALERT - BLOOD CULTURE IDENTIFICATION (BCID)  April [name] is an 73 y.o. female who presented to [facility] on 03/24/2019. Patient admitted with pancreatitis and found to have proteus bacteremia.  Assessment:  Patient currently on antibiotics for proteus bacteremia  BCID call received in regards to BCx obtained on 5/6: 1/2 bottles + streptococcus species  Name of physician (or Provider) Contacted: Dr. Wynona Neat  Current antibiotics: Aztreonam 2 g IV q8h  Changes to prescribed antibiotics recommended: Per MD, no change to antibiotics at this time. If determined to be pathogenic, recommend initiating treatment with vancomycin given patient allergies.  Results for orders placed or performed during the hospital encounter of 04/03/2019  Blood Culture ID Panel (Reflexed) (Collected: 04/19/2019  7:18 PM)  Result Value Ref Range   Enterococcus species NOT DETECTED NOT DETECTED   Listeria monocytogenes NOT DETECTED NOT DETECTED   Staphylococcus species NOT DETECTED NOT DETECTED   Staphylococcus aureus (BCID) NOT DETECTED NOT DETECTED   Streptococcus species DETECTED (A) NOT DETECTED   Streptococcus agalactiae NOT DETECTED NOT DETECTED   Streptococcus pneumoniae NOT DETECTED NOT DETECTED   Streptococcus pyogenes NOT DETECTED NOT DETECTED   Acinetobacter baumannii NOT DETECTED NOT DETECTED   Enterobacteriaceae species NOT DETECTED NOT DETECTED   Enterobacter cloacae complex NOT DETECTED NOT DETECTED   Escherichia coli NOT DETECTED NOT DETECTED   Klebsiella oxytoca NOT DETECTED NOT DETECTED   Klebsiella pneumoniae NOT DETECTED NOT DETECTED   Proteus species NOT DETECTED NOT DETECTED   Serratia marcescens NOT DETECTED NOT DETECTED   Haemophilus influenzae NOT DETECTED NOT DETECTED   Neisseria meningitidis NOT DETECTED NOT DETECTED   Pseudomonas aeruginosa NOT DETECTED NOT DETECTED   Candida albicans NOT DETECTED NOT DETECTED   Candida glabrata  NOT DETECTED NOT DETECTED   Candida krusei NOT DETECTED NOT DETECTED   Candida parapsilosis NOT DETECTED NOT DETECTED   Candida tropicalis NOT DETECTED NOT DETECTED    Cindi Carbon, PharmD 04/23/2019  7:15 AM

## 2019-04-23 NOTE — Progress Notes (Addendum)
PROGRESS NOTE    April [name]  ZOX:[id number] DOB: 24-Apr-1946 DOA: 03/21/2019 PCP: Richmond Campbell., PA-C   Brief Narrative:    73 y.o. CF  bipolar, continued tobacco use, ,alcohol abuse, GERD, hypertension, hyperlipidemia, pancreatitis, recent left intertrochanteric femur fracture status post intervention with intramedullary nail placement on 03/22/2019 presenting to the hospital for evaluation of hip pain and UTI symptoms. Limited history on admission  States she drinks 3 shot glasses of whiskey every day.  She is not able to tell me when her last drink was.  Also reports smoking 1 pack of cigarettes every day.   Ultimately found after admitted with sepsis secondary to acute pancreatitis and urinary tract infection.  Her hospitalization has been complicated by stress-induced cardiomyopathy, acute cardiogenic pulmonary edema and acute hypoxic respiratory failure.  On 5/9 after being seen by surgery just the day before for an enlarging pseudocyst the patient had a catastrophic bleed from the left gastric artery requiring emergent intervention by IR and multiple blood transfusions  General surgery IR gastroenterology and critical care were involved at that time She has stabilized on the stepdown unit but is now in rapid A. fib  Assessment & Plan  Hypovolemic shock Left gastric artery bleed secondary to erosion from pseudocyst Exceedingly poor prognosis although patient has stabilized Transfused 3 units blood + 2 U FFP for hemoglobin of 6 overnight currently at 8 Clear liquids per gastroenterology based on my discussion this morning Continue PPI and supportive measures Goals of care needed and sought ongoing-defer to palliative care-I will call daughter and update later today Cycling blood count 6 hourly today and then transition to 12 hourly tomorrow if all stable New onset A. fib likely reactive to above Not a candidate for anticoagulation Controlled with Cardizem IV GTT and  metoprolol pushes Acute pancreatitis complicated with pseudocyst and portal vein thrombosis -CT abdomen pelvis showed nonocclusive splenic vein/portal vein thrombus Have to hold all blood thinners at this time given hemorrhagic shock Defer to GI further planning as this is a complicated situation Leukocytosis is probably reactive secondary to acute decompensation and I would not culture further given lack of fever however we will keep a close eye on stepdown unit Sepsis secondary to urinary tract infection/Proteus mirabilis and Enterobacter  -Sepsis on admission as patient presented with fever, tachycardia elevated lactic acid -UA showed positive nitrites, large leukocytes,> 50 WBC, many bacteria -Urine culture shows multiple species -Blood cultures 04/11/2019 showed Proteus mirabilis (pansensitive) -Repeat blood cultures 04/09/2019 showed no growth to date -Patient was placed on IV antibiotics and recently completed 7-day course (antibiotics were discontinued 04/12/2019) -Spiked fevers 04/17/2019 with Enterobacter-fever spikes have improved -Patient with drug allergies to PCN and cephalosporins--currently on Azactam second round of antibiotics since 5/5-holding vancomycin for now as feel this is a contaminant of the strep on 5 6 cultures Acute systolic heart failure/stress-induced cardiomyopathy with acute cardiogenic pulmonary edema/acute hypoxic and hypercapnic respiratory failure -Chest x-ray showed pulmonary opacities, bilateral pulmonary infiltrates.  Stable pleural effusions -BNP 119.5 -Echocardiogram EF of 35 to 40%.  LV diastolic function cannot be evaluated.  Entire mid to apical region appears hypokinetic with hyperdynamic base. -Secondary to hypovolemic shock holding all agents other than Cardizem at this time when reliably taking p.o. can reimplement metoprolol 37.5 twice daily Acute metabolic encephalopathy/ sundowning -Patient currently awake, alert and oriented x1 -Continue thiamine  and multivitamins Hyperglycemia -No diagnosis of diabetes mellitus -Hemoglobin A1c 5.9 -Continue insulin sliding scale , changed to every 4 coverage Renal/electrolytes Potassium  3.2 PCCM protocol for electrolyte replacement Check magnesium phosphorus a.m. Hypocalcemia Alcohol abuse with potential of withdrawal Resolved Severe calorie protein malnutrition Suspect secondary to alcoholism nutrition consulted   Goals of care -Discussed with daughter. -Palliative care consulted and appreciated  DVT Prophylaxis Eliquis  Code Status: Full  Family Communication: None at bedside.    Disposition Plan: Admitted.  Disposition pending.  Patient will stay on stepdown at this time  Consultants Gastroenterology Palliative care General surgery Interventional radiology  Procedures  Embolization of left gastric artery 5/9    Subjective:   Somewhat coherent intermittently confused Asking for liquids however has been n.p.o. and was graduated on just now with clears No chest pain still has some abdominal pain No flatus no stool  Objective:   Vitals:   04/23/19 0400 04/23/19 0500 04/23/19 0700 04/23/19 0800  BP: 128/63 138/63 125/66   Pulse: (!) 105 (!) 116 (!) 108   Resp: (!) 36 (!) 39 (!) 31   Temp: 97.6 F (36.4 C)   98.2 F (36.8 C)  TempSrc: Oral   Oral  SpO2: 97% 96% 98%   Weight:  46.6 kg    Height:        Intake/Output Summary (Last 24 hours) at 04/23/2019 0902 Last data filed at 04/23/2019 0354 Gross per 24 hour  Intake 2115.87 ml  Output 1775 ml  Net 340.87 ml   Filed Weights   04/21/19 0613 04/22/19 0541 04/23/19 0500  Weight: 43.6 kg 44.2 kg 46.6 kg   Exam  Cachectic oriented x1 bitemporal wasting looks comfortable S1-S2 tachycardic rate about 120 Chest clear no added sound Abdomen softer however tender in epigastrium bowel sounds heard Neurologically intact moving all 4 limbs No breakdown Skin warm and lower extremities   Data Reviewed: I have  personally reviewed following labs and imaging studies  CBC: Recent Labs  Lab 04/22/19 0537 04/22/19 0744 04/22/19 1327 04/22/19 2205 04/23/19 0243 04/23/19 0523  WBC 45.9* 53.3* 30.7* 25.9* 27.5* 25.9*  NEUTROABS 39.4* 46.8*  --  22.5* 23.9* 22.2*  HGB 5.6* 4.8* 8.3* 6.0* 8.2* 7.9*  HCT 18.6* 14.8* 25.5* 17.6* 24.4* 22.6*  MCV 110.7* 105.0* 96.2 94.6 96.1 94.2  PLT 767* 689* 542* 486* 484* 434*   Basic Metabolic Panel: Recent Labs  Lab 04/20/19 0919 04/21/19 0504 04/22/19 0537 04/23/19 0243 04/23/19 0523  NA 136 135 133* 139 139  K 3.1* 3.4* 4.3 3.1* 3.2*  CL 103 101 98 106 105  CO2 26 24 17* 23 24  GLUCOSE 155* 120* 208* 186* 159*  BUN 14 11 16 21 21   CREATININE <0.30* <0.30* 0.58 0.49 0.46  CALCIUM 8.4* 8.4* 7.8* 7.6* 7.6*  MG 1.7  --   --   --   --    GFR: Estimated Creatinine Clearance: 47.4 mL/min (by C-G formula based on SCr of 0.46 mg/dL). Liver Function Tests: Recent Labs  Lab 04/19/19 0519 04/20/19 0919 04/21/19 0504 04/22/19 0537 04/23/19 0243  AST 19 29 24 28 17   ALT 15 22 21 16 19   ALKPHOS 99 136* 136* 108 81  BILITOT 0.3 0.4 1.0 0.7 0.8  PROT 5.1* 5.2* 5.7* 5.1* 5.0*  ALBUMIN 2.0* 2.0* 2.2* 1.9* 2.3*   Recent Labs  Lab 04/21/19 0504  LIPASE 203*   No results for input(s): AMMONIA in the last 168 hours. Coagulation Profile: Recent Labs  Lab 04/22/19 0537 04/22/19 1327  INR 2.1* 1.8*   Cardiac Enzymes: No results for input(s): CKTOTAL, CKMB, CKMBINDEX, TROPONINI in the last  168 hours. BNP (last 3 results) No results for input(s): PROBNP in the last 8760 hours. HbA1C: No results for input(s): HGBA1C in the last 72 hours. CBG: Recent Labs  Lab 04/22/19 1744 04/22/19 2151 04/23/19 0018 04/23/19 0350 04/23/19 0731  GLUCAP 151* 184* 188* 156* 117*   Lipid Profile: No results for input(s): CHOL, HDL, LDLCALC, TRIG, CHOLHDL, LDLDIRECT in the last 72 hours. Thyroid Function Tests: No results for input(s): TSH, T4TOTAL, FREET4,  T3FREE, THYROIDAB in the last 72 hours. Anemia Panel: No results for input(s): VITAMINB12, FOLATE, FERRITIN, TIBC, IRON, RETICCTPCT in the last 72 hours. Urine analysis:    Component Value Date/Time   COLORURINE AMBER (A) 04/17/2019 1409   APPEARANCEUR CLOUDY (A) 04/17/2019 1409   LABSPEC 1.010 04/17/2019 1409   PHURINE 7.0 04/17/2019 1409   GLUCOSEU NEGATIVE 04/17/2019 1409   HGBUR SMALL (A) 04/17/2019 1409   BILIRUBINUR NEGATIVE 04/17/2019 1409   KETONESUR NEGATIVE 04/17/2019 1409   PROTEINUR NEGATIVE 04/17/2019 1409   NITRITE NEGATIVE 04/17/2019 1409   LEUKOCYTESUR SMALL (A) 04/17/2019 1409   Sepsis Labs: @LABRCNTIP (procalcitonin:4,lacticidven:4)  ) Recent Results (from the past 240 hour(s))  Culture, Urine     Status: Abnormal   Collection Time: 04/17/19  2:09 PM  Result Value Ref Range Status   Specimen Description   Final    URINE, CLEAN CATCH Performed at [facility], [address], [place], [place] [address]    Special Requests   Final    NONE Performed at [facility], [address], [place], [place] [address]    Culture MULTIPLE SPECIES PRESENT, SUGGEST RECOLLECTION (A)  Final   Report Status 04/19/2019 FINAL  Final  Culture, blood (routine x 2)     Status: Abnormal   Collection Time: 04/17/19  2:48 PM  Result Value Ref Range Status   Specimen Description   Final    RIGHT ANTECUBITAL Performed at [facility], [address], [place], [place] [address]    Special Requests   Final    BOTTLES DRAWN AEROBIC AND ANAEROBIC Blood Culture adequate volume Performed at [facility], [address], [place], [place] [address]    Culture  Setup Time   Final    GRAM NEGATIVE RODS IN BOTH AEROBIC AND ANAEROBIC BOTTLES CRITICAL RESULT CALLED TO, READ BACK BY AND VERIFIED WITH: M. RENZ PHARMD, AT 0093 04/18/19 BY D. VANHOOK    Culture (A)  Final    PROTEUS MIRABILIS SUSCEPTIBILITIES PERFORMED  ON PREVIOUS CULTURE WITHIN THE LAST 5 DAYS. Performed at [facility], [address], [place], [place] [address]    Report Status 04/20/2019 FINAL  Final  Blood Culture ID Panel (Reflexed)     Status: Abnormal   Collection Time: 04/17/19  2:53 PM  Result Value Ref Range Status   Enterococcus species NOT DETECTED NOT DETECTED Final   Listeria monocytogenes NOT DETECTED NOT DETECTED Final   Staphylococcus species NOT DETECTED NOT DETECTED Final   Staphylococcus aureus (BCID) NOT DETECTED NOT DETECTED Final   Streptococcus species NOT DETECTED NOT DETECTED Final   Streptococcus agalactiae NOT DETECTED NOT DETECTED Final   Streptococcus pneumoniae NOT DETECTED NOT DETECTED Final   Streptococcus pyogenes NOT DETECTED NOT DETECTED Final   Acinetobacter baumannii NOT DETECTED NOT DETECTED Final   Enterobacteriaceae species DETECTED (A) NOT DETECTED Final    Comment: Enterobacteriaceae represent a large family of gram-negative bacteria, not a single organism. CRITICAL RESULT CALLED TO, READ BACK BY AND  VERIFIED WITH: Becky SaxM. SWAINE PHARMD, AT 1040 04/18/19 BY D. VANHOOK    Enterobacter cloacae complex NOT DETECTED NOT DETECTED Final   Escherichia coli NOT DETECTED NOT DETECTED Final   Klebsiella oxytoca NOT DETECTED NOT DETECTED Final   Klebsiella pneumoniae NOT DETECTED NOT DETECTED Final   Proteus species DETECTED (A) NOT DETECTED Final    Comment: CRITICAL RESULT CALLED TO, READ BACK BY AND VERIFIED WITH: Becky SaxM. SWAINE PHARMD, AT 1040 04/18/19 BY D. VANHOOK    Serratia marcescens NOT DETECTED NOT DETECTED Final   Carbapenem resistance NOT DETECTED NOT DETECTED Final   Haemophilus influenzae NOT DETECTED NOT DETECTED Final   Neisseria meningitidis NOT DETECTED NOT DETECTED Final   Pseudomonas aeruginosa NOT DETECTED NOT DETECTED Final   Candida albicans NOT DETECTED NOT DETECTED Final   Candida glabrata NOT DETECTED NOT DETECTED Final   Candida krusei NOT DETECTED NOT DETECTED Final    Candida parapsilosis NOT DETECTED NOT DETECTED Final   Candida tropicalis NOT DETECTED NOT DETECTED Final    Comment: Performed at [facility], [address] [place], [place] [address]  Culture, blood (routine x 2)     Status: Abnormal   Collection Time: 04/17/19  2:54 PM  Result Value Ref Range Status   Specimen Description   Final    BLOOD RIGHT HAND Performed at [facility], [address] [place], [place] [address]    Special Requests   Final    BOTTLES DRAWN AEROBIC AND ANAEROBIC Blood Culture adequate volume Performed at [facility], [address] [place], [place] [address]    Culture  Setup Time   Final    GRAM NEGATIVE RODS IN BOTH AEROBIC AND ANAEROBIC BOTTLES CRITICAL VALUE NOTED.  VALUE IS CONSISTENT WITH PREVIOUSLY REPORTED AND CALLED VALUE.    Culture PROTEUS MIRABILIS (A)  Final   Report Status 04/20/2019 FINAL  Final   Organism ID, Bacteria PROTEUS MIRABILIS  Final      Susceptibility   Proteus mirabilis - MIC*    AMPICILLIN <=2 SENSITIVE Sensitive     CEFAZOLIN <=4 SENSITIVE Sensitive     CEFEPIME <=1 SENSITIVE Sensitive     CEFTAZIDIME <=1 SENSITIVE Sensitive     CEFTRIAXONE <=1 SENSITIVE Sensitive     CIPROFLOXACIN <=0.25 SENSITIVE Sensitive     GENTAMICIN <=1 SENSITIVE Sensitive     IMIPENEM 2 SENSITIVE Sensitive     TRIMETH/SULFA <=20 SENSITIVE Sensitive     AMPICILLIN/SULBACTAM <=2 SENSITIVE Sensitive     PIP/TAZO <=4 SENSITIVE Sensitive     * PROTEUS MIRABILIS  Culture, blood (single)     Status: None (Preliminary result)   Collection Time: 04/19/19  7:18 PM  Result Value Ref Range Status   Specimen Description   Final    BLOOD LEFT HAND Performed at [facility], [address] [place], [place] [address]    Special Requests   Final    BOTTLES DRAWN AEROBIC AND ANAEROBIC Blood Culture adequate volume Performed at [facility], [address] [place],  [place] [id number]    Culture  Setup Time   Final    GRAM POSITIVE COCCI IN CHAINS AEROBIC BOTTLE ONLY CRITICAL RESULT CALLED TO, READ BACK BY AND VERIFIED WITH: Trixie DeisJ. GRIMSLEY,PHARMD [phone] 04/23/2019 Girtha Hake. TYSOR Performed at [facility], [address] [place], [place] [address]    Culture [facility]  Final   Report Status PENDING  Incomplete  Blood Culture ID Panel (Reflexed)  Status: Abnormal   Collection Time: 04/19/19  7:18 PM  Result Value Ref Range Status   Enterococcus species NOT DETECTED NOT DETECTED Final   Listeria monocytogenes NOT DETECTED NOT DETECTED Final   Staphylococcus species NOT DETECTED NOT DETECTED Final   Staphylococcus aureus (BCID) NOT DETECTED NOT DETECTED Final   Streptococcus species DETECTED (A) NOT DETECTED Final    Comment: Not Enterococcus species, Streptococcus agalactiae, Streptococcus pyogenes, or Streptococcus pneumoniae. CRITICAL RESULT CALLED TO, READ BACK BY AND VERIFIED WITH: Trixie Deis 1610 04/23/2019 T. TYSOR    Streptococcus agalactiae NOT DETECTED NOT DETECTED Final   Streptococcus pneumoniae NOT DETECTED NOT DETECTED Final   Streptococcus pyogenes NOT DETECTED NOT DETECTED Final   Acinetobacter baumannii NOT DETECTED NOT DETECTED Final   Enterobacteriaceae species NOT DETECTED NOT DETECTED Final   Enterobacter cloacae complex NOT DETECTED NOT DETECTED Final   Escherichia coli NOT DETECTED NOT DETECTED Final   Klebsiella oxytoca NOT DETECTED NOT DETECTED Final   Klebsiella pneumoniae NOT DETECTED NOT DETECTED Final   Proteus species NOT DETECTED NOT DETECTED Final   Serratia marcescens NOT DETECTED NOT DETECTED Final   Haemophilus influenzae NOT DETECTED NOT DETECTED Final   Neisseria meningitidis NOT DETECTED NOT DETECTED Final   Pseudomonas aeruginosa NOT DETECTED NOT DETECTED Final   Candida albicans NOT DETECTED NOT DETECTED Final   Candida glabrata NOT DETECTED NOT DETECTED Final   Candida krusei NOT DETECTED NOT  DETECTED Final   Candida parapsilosis NOT DETECTED NOT DETECTED Final   Candida tropicalis NOT DETECTED NOT DETECTED Final    Comment: Performed at [facility], [address], [place], [place] [address]      Radiology Studies: Ir Angiogram Visceral Selective  Result Date: 04/22/2019 INDICATION: Acute left upper quadrant hemorrhage by CTA resulting in hemorrhagic shock. Complication of pancreatitis. EXAM: Ultrasound guidance for vascular access Celiac, splenic, and left gastric catheterizations and angiograms Peripheral left gastric branch micro catheterization, angiograms, and micro coil/Gelfoam embolization for active arterial bleeding. MEDICATIONS: 1% lidocaine local. ANESTHESIA/SEDATION: Moderate (conscious) sedation was employed during this procedure. A total of Versed 1.0 mg and Fentanyl 75 mcg was administered intravenously. Moderate Sedation Time: 70 minutes. The patient's level of consciousness and vital signs were monitored continuously by radiology nursing throughout the procedure under my direct supervision. CONTRAST:  125 cc FLUOROSCOPY TIME:  Fluoroscopy Time: 12 minutes 42 seconds (1,643 mGy). COMPLICATIONS: None immediate. PROCEDURE: Informed consent was obtained from the patient following explanation of the procedure, risks, benefits and alternatives. The patient understands, agrees and consents for the procedure. All questions were addressed. A time out was performed prior to the initiation of the procedure. Maximal barrier sterile technique utilized including caps, mask, sterile gowns, sterile gloves, large sterile drape, hand hygiene, and Betadine prep. Under sterile conditions and local anesthesia, ultrasound micropuncture access performed of the right common femoral artery. Ultrasound images obtained for documentation of the right femoral artery access. Five French sheath inserted. Five French C2 catheter utilized to select the celiac origin. Celiac angiogram performed. Celiac:  The celiac origin is widely patent. The left gastric, splenic and hepatic vasculature all patent. There is an accessory left hepatic artery off the left gastric. Active contrast extravasation/bleeding is present from peripheral small in the left upper quadrant either from the splenic or left gastric branches. This active bleeding site correlates with the recent CTA. C2 catheter was advanced over a Glidewire into the splenic artery. Splenic angiogram performed. Splenic: Initial splenic angiogram suggests active bleeding from a small  pancreaticoduodenal branch from the main splenic artery peripherally. Renegade STC microcatheter advanced further peripheral in the splenic artery. Additional splenic angiogram performed. This failed to demonstrate the active bleeding branch in the left upper quadrant from the splenic artery. Microcatheter removed. Repeat angiography performed of the celiac artery through the C2 catheter. Active bleeding still demonstrated, suspect now from peripheral left gastric branches. Renegade STC microcatheter was advanced into the left gastric artery over a double angled Glidewire. Peripheral left gastric angiogram performed. Left gastric: Peripheral small branches of the left gastric artery demonstrate active bleeding in the left upper quadrant correlating with the CTA. Left gastric artery peripheral branch catheterization, angiograms, and micro coil/Gelfoam embolization: Initially the microcatheter was advanced into a medial peripheral branch. Contrast injection failed to demonstrate the active bleeding source. Next, the microcatheter was retracted and advanced into a more lateral left gastric peripheral branch. This confirms the active bleeding source. For embolization: 2 x 4 standard and soft interlock coils and 3 x 6 standard and 3 x 10 soft interlock coils were deployed into the peripheral left gastric branch at the bleeding site. Following micro coil embolization, repeat left gastric  angiogram confirms near complete stasis. With delayed imaging, there is still some small peripheral left gastric branches with collateral supply to the bleeding site. Because of this, approximately 5 cc of Gel-Foam slurry was slowly instilled into the left gastric vascular territory at the bleeding site. Following micro coil and Gelfoam embolization, the left gastric vascular territory at the bleeding site demonstrates complete stasis. No further active bleeding. Microcatheter removed. Repeat angiogram through the C2 catheter demonstrates no additional source of bleeding. Access removed. Hemostasis obtained with the ExoSeal device. No immediate complication. Patient tolerated the procedure well. IMPRESSION: Successful peripheral left gastric artery micro coil and Gelfoam embolization at the active bleeding site which correlated with the CTA. Electronically Signed   By: Judie Petit.  Shick M.D.   On: 04/22/2019 17:48   Ir Angiogram Selective Each Additional Vessel  Result Date: 04/22/2019 INDICATION: Acute left upper quadrant hemorrhage by CTA resulting in hemorrhagic shock. Complication of pancreatitis. EXAM: Ultrasound guidance for vascular access Celiac, splenic, and left gastric catheterizations and angiograms Peripheral left gastric branch micro catheterization, angiograms, and micro coil/Gelfoam embolization for active arterial bleeding. MEDICATIONS: 1% lidocaine local. ANESTHESIA/SEDATION: Moderate (conscious) sedation was employed during this procedure. A total of Versed 1.0 mg and Fentanyl 75 mcg was administered intravenously. Moderate Sedation Time: 70 minutes. The patient's level of consciousness and vital signs were monitored continuously by radiology nursing throughout the procedure under my direct supervision. CONTRAST:  125 cc FLUOROSCOPY TIME:  Fluoroscopy Time: 12 minutes 42 seconds (1,643 mGy). COMPLICATIONS: None immediate. PROCEDURE: Informed consent was obtained from the patient following explanation  of the procedure, risks, benefits and alternatives. The patient understands, agrees and consents for the procedure. All questions were addressed. A time out was performed prior to the initiation of the procedure. Maximal barrier sterile technique utilized including caps, mask, sterile gowns, sterile gloves, large sterile drape, hand hygiene, and Betadine prep. Under sterile conditions and local anesthesia, ultrasound micropuncture access performed of the right common femoral artery. Ultrasound images obtained for documentation of the right femoral artery access. Five French sheath inserted. Five French C2 catheter utilized to select the celiac origin. Celiac angiogram performed. Celiac: The celiac origin is widely patent. The left gastric, splenic and hepatic vasculature all patent. There is an accessory left hepatic artery off the left gastric. Active contrast extravasation/bleeding is present from peripheral small in  the left upper quadrant either from the splenic or left gastric branches. This active bleeding site correlates with the recent CTA. C2 catheter was advanced over a Glidewire into the splenic artery. Splenic angiogram performed. Splenic: Initial splenic angiogram suggests active bleeding from a small pancreaticoduodenal branch from the main splenic artery peripherally. Renegade STC microcatheter advanced further peripheral in the splenic artery. Additional splenic angiogram performed. This failed to demonstrate the active bleeding branch in the left upper quadrant from the splenic artery. Microcatheter removed. Repeat angiography performed of the celiac artery through the C2 catheter. Active bleeding still demonstrated, suspect now from peripheral left gastric branches. Renegade STC microcatheter was advanced into the left gastric artery over a double angled Glidewire. Peripheral left gastric angiogram performed. Left gastric: Peripheral small branches of the left gastric artery demonstrate active  bleeding in the left upper quadrant correlating with the CTA. Left gastric artery peripheral branch catheterization, angiograms, and micro coil/Gelfoam embolization: Initially the microcatheter was advanced into a medial peripheral branch. Contrast injection failed to demonstrate the active bleeding source. Next, the microcatheter was retracted and advanced into a more lateral left gastric peripheral branch. This confirms the active bleeding source. For embolization: 2 x 4 standard and soft interlock coils and 3 x 6 standard and 3 x 10 soft interlock coils were deployed into the peripheral left gastric branch at the bleeding site. Following micro coil embolization, repeat left gastric angiogram confirms near complete stasis. With delayed imaging, there is still some small peripheral left gastric branches with collateral supply to the bleeding site. Because of this, approximately 5 cc of Gel-Foam slurry was slowly instilled into the left gastric vascular territory at the bleeding site. Following micro coil and Gelfoam embolization, the left gastric vascular territory at the bleeding site demonstrates complete stasis. No further active bleeding. Microcatheter removed. Repeat angiogram through the C2 catheter demonstrates no additional source of bleeding. Access removed. Hemostasis obtained with the ExoSeal device. No immediate complication. Patient tolerated the procedure well. IMPRESSION: Successful peripheral left gastric artery micro coil and Gelfoam embolization at the active bleeding site which correlated with the CTA. Electronically Signed   By: Judie Petit.  Shick M.D.   On: 04/22/2019 17:48   Ir Angiogram Selective Each Additional Vessel  Result Date: 04/22/2019 INDICATION: Acute left upper quadrant hemorrhage by CTA resulting in hemorrhagic shock. Complication of pancreatitis. EXAM: Ultrasound guidance for vascular access Celiac, splenic, and left gastric catheterizations and angiograms Peripheral left gastric  branch micro catheterization, angiograms, and micro coil/Gelfoam embolization for active arterial bleeding. MEDICATIONS: 1% lidocaine local. ANESTHESIA/SEDATION: Moderate (conscious) sedation was employed during this procedure. A total of Versed 1.0 mg and Fentanyl 75 mcg was administered intravenously. Moderate Sedation Time: 70 minutes. The patient's level of consciousness and vital signs were monitored continuously by radiology nursing throughout the procedure under my direct supervision. CONTRAST:  125 cc FLUOROSCOPY TIME:  Fluoroscopy Time: 12 minutes 42 seconds (1,643 mGy). COMPLICATIONS: None immediate. PROCEDURE: Informed consent was obtained from the patient following explanation of the procedure, risks, benefits and alternatives. The patient understands, agrees and consents for the procedure. All questions were addressed. A time out was performed prior to the initiation of the procedure. Maximal barrier sterile technique utilized including caps, mask, sterile gowns, sterile gloves, large sterile drape, hand hygiene, and Betadine prep. Under sterile conditions and local anesthesia, ultrasound micropuncture access performed of the right common femoral artery. Ultrasound images obtained for documentation of the right femoral artery access. Five French sheath inserted. Five French C2 catheter  utilized to select the celiac origin. Celiac angiogram performed. Celiac: The celiac origin is widely patent. The left gastric, splenic and hepatic vasculature all patent. There is an accessory left hepatic artery off the left gastric. Active contrast extravasation/bleeding is present from peripheral small in the left upper quadrant either from the splenic or left gastric branches. This active bleeding site correlates with the recent CTA. C2 catheter was advanced over a Glidewire into the splenic artery. Splenic angiogram performed. Splenic: Initial splenic angiogram suggests active bleeding from a small  pancreaticoduodenal branch from the main splenic artery peripherally. Renegade STC microcatheter advanced further peripheral in the splenic artery. Additional splenic angiogram performed. This failed to demonstrate the active bleeding branch in the left upper quadrant from the splenic artery. Microcatheter removed. Repeat angiography performed of the celiac artery through the C2 catheter. Active bleeding still demonstrated, suspect now from peripheral left gastric branches. Renegade STC microcatheter was advanced into the left gastric artery over a double angled Glidewire. Peripheral left gastric angiogram performed. Left gastric: Peripheral small branches of the left gastric artery demonstrate active bleeding in the left upper quadrant correlating with the CTA. Left gastric artery peripheral branch catheterization, angiograms, and micro coil/Gelfoam embolization: Initially the microcatheter was advanced into a medial peripheral branch. Contrast injection failed to demonstrate the active bleeding source. Next, the microcatheter was retracted and advanced into a more lateral left gastric peripheral branch. This confirms the active bleeding source. For embolization: 2 x 4 standard and soft interlock coils and 3 x 6 standard and 3 x 10 soft interlock coils were deployed into the peripheral left gastric branch at the bleeding site. Following micro coil embolization, repeat left gastric angiogram confirms near complete stasis. With delayed imaging, there is still some small peripheral left gastric branches with collateral supply to the bleeding site. Because of this, approximately 5 cc of Gel-Foam slurry was slowly instilled into the left gastric vascular territory at the bleeding site. Following micro coil and Gelfoam embolization, the left gastric vascular territory at the bleeding site demonstrates complete stasis. No further active bleeding. Microcatheter removed. Repeat angiogram through the C2 catheter demonstrates  no additional source of bleeding. Access removed. Hemostasis obtained with the ExoSeal device. No immediate complication. Patient tolerated the procedure well. IMPRESSION: Successful peripheral left gastric artery micro coil and Gelfoam embolization at the active bleeding site which correlated with the CTA. Electronically Signed   By: Judie Petit.  Shick M.D.   On: 04/22/2019 17:48   Ir Angiogram Selective Each Additional Vessel  Result Date: 04/22/2019 INDICATION: Acute left upper quadrant hemorrhage by CTA resulting in hemorrhagic shock. Complication of pancreatitis. EXAM: Ultrasound guidance for vascular access Celiac, splenic, and left gastric catheterizations and angiograms Peripheral left gastric branch micro catheterization, angiograms, and micro coil/Gelfoam embolization for active arterial bleeding. MEDICATIONS: 1% lidocaine local. ANESTHESIA/SEDATION: Moderate (conscious) sedation was employed during this procedure. A total of Versed 1.0 mg and Fentanyl 75 mcg was administered intravenously. Moderate Sedation Time: 70 minutes. The patient's level of consciousness and vital signs were monitored continuously by radiology nursing throughout the procedure under my direct supervision. CONTRAST:  125 cc FLUOROSCOPY TIME:  Fluoroscopy Time: 12 minutes 42 seconds (1,643 mGy). COMPLICATIONS: None immediate. PROCEDURE: Informed consent was obtained from the patient following explanation of the procedure, risks, benefits and alternatives. The patient understands, agrees and consents for the procedure. All questions were addressed. A time out was performed prior to the initiation of the procedure. Maximal barrier sterile technique utilized including caps, mask, sterile gowns,  sterile gloves, large sterile drape, hand hygiene, and Betadine prep. Under sterile conditions and local anesthesia, ultrasound micropuncture access performed of the right common femoral artery. Ultrasound images obtained for documentation of the right  femoral artery access. Five French sheath inserted. Five French C2 catheter utilized to select the celiac origin. Celiac angiogram performed. Celiac: The celiac origin is widely patent. The left gastric, splenic and hepatic vasculature all patent. There is an accessory left hepatic artery off the left gastric. Active contrast extravasation/bleeding is present from peripheral small in the left upper quadrant either from the splenic or left gastric branches. This active bleeding site correlates with the recent CTA. C2 catheter was advanced over a Glidewire into the splenic artery. Splenic angiogram performed. Splenic: Initial splenic angiogram suggests active bleeding from a small pancreaticoduodenal branch from the main splenic artery peripherally. Renegade STC microcatheter advanced further peripheral in the splenic artery. Additional splenic angiogram performed. This failed to demonstrate the active bleeding branch in the left upper quadrant from the splenic artery. Microcatheter removed. Repeat angiography performed of the celiac artery through the C2 catheter. Active bleeding still demonstrated, suspect now from peripheral left gastric branches. Renegade STC microcatheter was advanced into the left gastric artery over a double angled Glidewire. Peripheral left gastric angiogram performed. Left gastric: Peripheral small branches of the left gastric artery demonstrate active bleeding in the left upper quadrant correlating with the CTA. Left gastric artery peripheral branch catheterization, angiograms, and micro coil/Gelfoam embolization: Initially the microcatheter was advanced into a medial peripheral branch. Contrast injection failed to demonstrate the active bleeding source. Next, the microcatheter was retracted and advanced into a more lateral left gastric peripheral branch. This confirms the active bleeding source. For embolization: 2 x 4 standard and soft interlock coils and 3 x 6 standard and 3 x 10 soft  interlock coils were deployed into the peripheral left gastric branch at the bleeding site. Following micro coil embolization, repeat left gastric angiogram confirms near complete stasis. With delayed imaging, there is still some small peripheral left gastric branches with collateral supply to the bleeding site. Because of this, approximately 5 cc of Gel-Foam slurry was slowly instilled into the left gastric vascular territory at the bleeding site. Following micro coil and Gelfoam embolization, the left gastric vascular territory at the bleeding site demonstrates complete stasis. No further active bleeding. Microcatheter removed. Repeat angiogram through the C2 catheter demonstrates no additional source of bleeding. Access removed. Hemostasis obtained with the ExoSeal device. No immediate complication. Patient tolerated the procedure well. IMPRESSION: Successful peripheral left gastric artery micro coil and Gelfoam embolization at the active bleeding site which correlated with the CTA. Electronically Signed   By: Judie Petit.  Shick M.D.   On: 04/22/2019 17:48   Ir Angiogram Selective Each Additional Vessel  Result Date: 04/22/2019 INDICATION: Acute left upper quadrant hemorrhage by CTA resulting in hemorrhagic shock. Complication of pancreatitis. EXAM: Ultrasound guidance for vascular access Celiac, splenic, and left gastric catheterizations and angiograms Peripheral left gastric branch micro catheterization, angiograms, and micro coil/Gelfoam embolization for active arterial bleeding. MEDICATIONS: 1% lidocaine local. ANESTHESIA/SEDATION: Moderate (conscious) sedation was employed during this procedure. A total of Versed 1.0 mg and Fentanyl 75 mcg was administered intravenously. Moderate Sedation Time: 70 minutes. The patient's level of consciousness and vital signs were monitored continuously by radiology nursing throughout the procedure under my direct supervision. CONTRAST:  125 cc FLUOROSCOPY TIME:  Fluoroscopy Time:  12 minutes 42 seconds (1,643 mGy). COMPLICATIONS: None immediate. PROCEDURE: Informed consent was obtained from  the patient following explanation of the procedure, risks, benefits and alternatives. The patient understands, agrees and consents for the procedure. All questions were addressed. A time out was performed prior to the initiation of the procedure. Maximal barrier sterile technique utilized including caps, mask, sterile gowns, sterile gloves, large sterile drape, hand hygiene, and Betadine prep. Under sterile conditions and local anesthesia, ultrasound micropuncture access performed of the right common femoral artery. Ultrasound images obtained for documentation of the right femoral artery access. Five French sheath inserted. Five French C2 catheter utilized to select the celiac origin. Celiac angiogram performed. Celiac: The celiac origin is widely patent. The left gastric, splenic and hepatic vasculature all patent. There is an accessory left hepatic artery off the left gastric. Active contrast extravasation/bleeding is present from peripheral small in the left upper quadrant either from the splenic or left gastric branches. This active bleeding site correlates with the recent CTA. C2 catheter was advanced over a Glidewire into the splenic artery. Splenic angiogram performed. Splenic: Initial splenic angiogram suggests active bleeding from a small pancreaticoduodenal branch from the main splenic artery peripherally. Renegade STC microcatheter advanced further peripheral in the splenic artery. Additional splenic angiogram performed. This failed to demonstrate the active bleeding branch in the left upper quadrant from the splenic artery. Microcatheter removed. Repeat angiography performed of the celiac artery through the C2 catheter. Active bleeding still demonstrated, suspect now from peripheral left gastric branches. Renegade STC microcatheter was advanced into the left gastric artery over a double angled  Glidewire. Peripheral left gastric angiogram performed. Left gastric: Peripheral small branches of the left gastric artery demonstrate active bleeding in the left upper quadrant correlating with the CTA. Left gastric artery peripheral branch catheterization, angiograms, and micro coil/Gelfoam embolization: Initially the microcatheter was advanced into a medial peripheral branch. Contrast injection failed to demonstrate the active bleeding source. Next, the microcatheter was retracted and advanced into a more lateral left gastric peripheral branch. This confirms the active bleeding source. For embolization: 2 x 4 standard and soft interlock coils and 3 x 6 standard and 3 x 10 soft interlock coils were deployed into the peripheral left gastric branch at the bleeding site. Following micro coil embolization, repeat left gastric angiogram confirms near complete stasis. With delayed imaging, there is still some small peripheral left gastric branches with collateral supply to the bleeding site. Because of this, approximately 5 cc of Gel-Foam slurry was slowly instilled into the left gastric vascular territory at the bleeding site. Following micro coil and Gelfoam embolization, the left gastric vascular territory at the bleeding site demonstrates complete stasis. No further active bleeding. Microcatheter removed. Repeat angiogram through the C2 catheter demonstrates no additional source of bleeding. Access removed. Hemostasis obtained with the ExoSeal device. No immediate complication. Patient tolerated the procedure well. IMPRESSION: Successful peripheral left gastric artery micro coil and Gelfoam embolization at the active bleeding site which correlated with the CTA. Electronically Signed   By: Judie Petit.  Shick M.D.   On: 04/22/2019 17:48   Ir US Guide Vasc Access Right  Result Date: 04/22/2019 INDICATION: Acute left upper quadrant hemorrhage by CTA resulting in hemorrhagic shock. Complication of pancreatitis. EXAM:  Ultrasound guidance for vascular access Celiac, splenic, and left gastric catheterizations and angiograms Peripheral left gastric branch micro catheterization, angiograms, and micro coil/Gelfoam embolization for active arterial bleeding. MEDICATIONS: 1% lidocaine local. ANESTHESIA/SEDATION: Moderate (conscious) sedation was employed during this procedure. A total of Versed 1.0 mg and Fentanyl 75 mcg was administered intravenously. Moderate Sedation Time: 70 minutes.  The patient's level of consciousness and vital signs were monitored continuously by radiology nursing throughout the procedure under my direct supervision. CONTRAST:  125 cc FLUOROSCOPY TIME:  Fluoroscopy Time: 12 minutes 42 seconds (1,643 mGy). COMPLICATIONS: None immediate. PROCEDURE: Informed consent was obtained from the patient following explanation of the procedure, risks, benefits and alternatives. The patient understands, agrees and consents for the procedure. All questions were addressed. A time out was performed prior to the initiation of the procedure. Maximal barrier sterile technique utilized including caps, mask, sterile gowns, sterile gloves, large sterile drape, hand hygiene, and Betadine prep. Under sterile conditions and local anesthesia, ultrasound micropuncture access performed of the right common femoral artery. Ultrasound images obtained for documentation of the right femoral artery access. Five French sheath inserted. Five French C2 catheter utilized to select the celiac origin. Celiac angiogram performed. Celiac: The celiac origin is widely patent. The left gastric, splenic and hepatic vasculature all patent. There is an accessory left hepatic artery off the left gastric. Active contrast extravasation/bleeding is present from peripheral small in the left upper quadrant either from the splenic or left gastric branches. This active bleeding site correlates with the recent CTA. C2 catheter was advanced over a Glidewire into the  splenic artery. Splenic angiogram performed. Splenic: Initial splenic angiogram suggests active bleeding from a small pancreaticoduodenal branch from the main splenic artery peripherally. Renegade STC microcatheter advanced further peripheral in the splenic artery. Additional splenic angiogram performed. This failed to demonstrate the active bleeding branch in the left upper quadrant from the splenic artery. Microcatheter removed. Repeat angiography performed of the celiac artery through the C2 catheter. Active bleeding still demonstrated, suspect now from peripheral left gastric branches. Renegade STC microcatheter was advanced into the left gastric artery over a double angled Glidewire. Peripheral left gastric angiogram performed. Left gastric: Peripheral small branches of the left gastric artery demonstrate active bleeding in the left upper quadrant correlating with the CTA. Left gastric artery peripheral branch catheterization, angiograms, and micro coil/Gelfoam embolization: Initially the microcatheter was advanced into a medial peripheral branch. Contrast injection failed to demonstrate the active bleeding source. Next, the microcatheter was retracted and advanced into a more lateral left gastric peripheral branch. This confirms the active bleeding source. For embolization: 2 x 4 standard and soft interlock coils and 3 x 6 standard and 3 x 10 soft interlock coils were deployed into the peripheral left gastric branch at the bleeding site. Following micro coil embolization, repeat left gastric angiogram confirms near complete stasis. With delayed imaging, there is still some small peripheral left gastric branches with collateral supply to the bleeding site. Because of this, approximately 5 cc of Gel-Foam slurry was slowly instilled into the left gastric vascular territory at the bleeding site. Following micro coil and Gelfoam embolization, the left gastric vascular territory at the bleeding site demonstrates  complete stasis. No further active bleeding. Microcatheter removed. Repeat angiogram through the C2 catheter demonstrates no additional source of bleeding. Access removed. Hemostasis obtained with the ExoSeal device. No immediate complication. Patient tolerated the procedure well. IMPRESSION: Successful peripheral left gastric artery micro coil and Gelfoam embolization at the active bleeding site which correlated with the CTA. Electronically Signed   By: Judie Petit.  Shick M.D.   On: 04/22/2019 17:48   Dg Chest Port 1 View  Result Date: 04/22/2019 CLINICAL DATA:  Central line placement. EXAM: PORTABLE CHEST 1 VIEW COMPARISON:  04/20/2011 FINDINGS: A LEFT subclavian central venous catheter is noted with tip overlying the LOWER SVC. Cardiomediastinal silhouette  is unchanged. LEFT LOWER lung consolidation/atelectasis/pleural effusion again noted. There is no evidence of pneumothorax. IMPRESSION: LEFT subclavian central venous catheter placement with tip overlying the LOWER SVC. No pneumothorax. Little significant change of LEFT LOWER lung consolidation/atelectasis/pleural effusion. Electronically Signed   By: Harmon Pier M.D.   On: 04/22/2019 11:44   Dg Abd 2 Views  Result Date: 04/22/2019 CLINICAL DATA:  Acute right-sided abdominal pain. EXAM: ABDOMEN - 2 VIEW COMPARISON:  CT scan of Apr 20, 2019. Radiographs of October 18, 2018. FINDINGS: Mildly dilated small bowel loops are noted in the left lower quadrant which may represent ileus or possibly obstruction. No free air is noted to suggest pneumoperitoneum. Atherosclerosis of abdominal aorta is noted. Status post cholecystectomy. No significant colonic dilatation is noted. IMPRESSION: Mildly dilated small bowel loops are noted in the left lower quadrant of the abdomen which may represent ileus or possibly obstruction. Aortic Atherosclerosis (ICD10-I70.0). Electronically Signed   By: Lupita Raider M.D.   On: 04/22/2019 08:41   Ir Embo Arterial Not Hemorr [employer]  Guide Roadmapping  Result Date: 04/22/2019 INDICATION: Acute left upper quadrant hemorrhage by CTA resulting in hemorrhagic shock. Complication of pancreatitis. EXAM: Ultrasound guidance for vascular access Celiac, splenic, and left gastric catheterizations and angiograms Peripheral left gastric branch micro catheterization, angiograms, and micro coil/Gelfoam embolization for active arterial bleeding. MEDICATIONS: 1% lidocaine local. ANESTHESIA/SEDATION: Moderate (conscious) sedation was employed during this procedure. A total of Versed 1.0 mg and Fentanyl 75 mcg was administered intravenously. Moderate Sedation Time: 70 minutes. The patient's level of consciousness and vital signs were monitored continuously by radiology nursing throughout the procedure under my direct supervision. CONTRAST:  125 cc FLUOROSCOPY TIME:  Fluoroscopy Time: 12 minutes 42 seconds (1,643 mGy). COMPLICATIONS: None immediate. PROCEDURE: Informed consent was obtained from the patient following explanation of the procedure, risks, benefits and alternatives. The patient understands, agrees and consents for the procedure. All questions were addressed. A time out was performed prior to the initiation of the procedure. Maximal barrier sterile technique utilized including caps, mask, sterile gowns, sterile gloves, large sterile drape, hand hygiene, and Betadine prep. Under sterile conditions and local anesthesia, ultrasound micropuncture access performed of the right common femoral artery. Ultrasound images obtained for documentation of the right femoral artery access. Five French sheath inserted. Five French C2 catheter utilized to select the celiac origin. Celiac angiogram performed. Celiac: The celiac origin is widely patent. The left gastric, splenic and hepatic vasculature all patent. There is an accessory left hepatic artery off the left gastric. Active contrast extravasation/bleeding is present from peripheral small in the left upper  quadrant either from the splenic or left gastric branches. This active bleeding site correlates with the recent CTA. C2 catheter was advanced over a Glidewire into the splenic artery. Splenic angiogram performed. Splenic: Initial splenic angiogram suggests active bleeding from a small pancreaticoduodenal branch from the main splenic artery peripherally. Renegade STC microcatheter advanced further peripheral in the splenic artery. Additional splenic angiogram performed. This failed to demonstrate the active bleeding branch in the left upper quadrant from the splenic artery. Microcatheter removed. Repeat angiography performed of the celiac artery through the C2 catheter. Active bleeding still demonstrated, suspect now from peripheral left gastric branches. Renegade STC microcatheter was advanced into the left gastric artery over a double angled Glidewire. Peripheral left gastric angiogram performed. Left gastric: Peripheral small branches of the left gastric artery demonstrate active bleeding in the left upper quadrant correlating with the CTA. Left gastric artery peripheral branch  catheterization, angiograms, and micro coil/Gelfoam embolization: Initially the microcatheter was advanced into a medial peripheral branch. Contrast injection failed to demonstrate the active bleeding source. Next, the microcatheter was retracted and advanced into a more lateral left gastric peripheral branch. This confirms the active bleeding source. For embolization: 2 x 4 standard and soft interlock coils and 3 x 6 standard and 3 x 10 soft interlock coils were deployed into the peripheral left gastric branch at the bleeding site. Following micro coil embolization, repeat left gastric angiogram confirms near complete stasis. With delayed imaging, there is still some small peripheral left gastric branches with collateral supply to the bleeding site. Because of this, approximately 5 cc of Gel-Foam slurry was slowly instilled into the left  gastric vascular territory at the bleeding site. Following micro coil and Gelfoam embolization, the left gastric vascular territory at the bleeding site demonstrates complete stasis. No further active bleeding. Microcatheter removed. Repeat angiogram through the C2 catheter demonstrates no additional source of bleeding. Access removed. Hemostasis obtained with the ExoSeal device. No immediate complication. Patient tolerated the procedure well. IMPRESSION: Successful peripheral left gastric artery micro coil and Gelfoam embolization at the active bleeding site which correlated with the CTA. Electronically Signed   By: Judie Petit.  Shick M.D.   On: 04/22/2019 17:48   Ct Angio Abd/pel W/ And/or W/o  Result Date: 04/22/2019 CLINICAL DATA:  73 year old female with a history of necrotizing pancreatitis and hypotension EXAM: CTA ABDOMEN AND PELVIS wITHOUT AND WITH CONTRAST TECHNIQUE: Multidetector CT imaging of the abdomen and pelvis was performed using the standard protocol during bolus administration of intravenous contrast. Multiplanar reconstructed images and MIPs were obtained and reviewed to evaluate the vascular anatomy. CONTRAST:  OMNIPAQUE IOHEXOL 350 MG/ML SOLN COMPARISON:  04/20/2019, 03/18/2019 FINDINGS: VASCULAR Aorta: Atherosclerosis of the thoracic aorta. Greatest diameter of the aorta at the aortic hiatus measures 2.4 cm. Atherosclerosis of the abdominal aorta. Diameter of infrarenal aorta just below the renal artery origins measures 2.4 cm. Celiac: Celiac artery patent with mild atherosclerotic changes. Celiac artery contributes to common hepatic artery, left gastric artery, splenic artery. There is a replaced left hepatic artery. There is presumed origin of the extravasated contrast into the new hematoma from left gastric branches, however, no direct communication is identified on the CT. SMA: Atherosclerotic changes of the SMA origin. Renals: Atherosclerotic changes at the bilateral renal artery origins.  Estimated 50% narrowing on the right. No high-grade stenosis on the left. IMA: IMA patent. Right lower extremity: Moderate atherosclerotic changes of the right common iliac and external iliac artery. Hypogastric artery is patent. Dense calcifications of the right common femoral artery. Left lower extremity: Moderate to advanced atherosclerotic changes of the left iliac system including common iliac and external iliac artery. Hypogastric artery is patent. Common femoral artery with atherosclerotic calcifications. Veins: Unremarkable IVC and bilateral renal veins. Bilateral iliac veins patent. Redemonstration of thrombus at the spleno portal confluence with patency of the superior mesenteric vein. Splenic vein appears occluded. Multiple collateral draining veins of the left upper quadrant secondary to splenic vein occlusion, compatible with portal-portal and portal-systemic collateral veins. The portal-systemic collateral veins contribute to engorgement of the bilateral adnexal veins and the left gonadal vein. Review of the MIP images confirms the above findings. NON-VASCULAR Lower chest: Bilateral pleural effusions with associated atelectasis. Hepatobiliary: The enhancement/attenuation of the liver is uniform on the delayed images, with mottled appearance on the arterial images, reflecting delayed perfusion secondary to portal vein compromise. Cholecystectomy. Pancreas: Coarse calcifications throughout the  pancreatic parenchyma. Pancreas is compressed by expanding hematoma/hemorrhage interposed between stomach and the pancreas. There is been significant enlargement in the interval, with the prior CT dated 04/19/2021 measuring hematoma 3.2 cm. Hematoma now measures 12.6 cm x 7.2 cm. Linear hyperdense focus at the superior margin of the hematoma on image 38 of series 15 is not present on the noncontrast series, compatible extravasated contrast/hemorrhage. Pancreatic parenchyma at the pancreas neck demonstrates mottled  enhancement/attenuation. Spleen: Unremarkable Adrenals/Urinary Tract: Adenoma of the left adrenal gland. Right: No hydronephrosis. Symmetric perfusion to the left. No nephrolithiasis. Unremarkable course of the right ureter. Left: No hydronephrosis. Symmetric perfusion to the right. No nephrolithiasis. Unremarkable course of the left ureter. Circumferential wall thickening of the urinary bladder which is partially distended. Stomach/Bowel: Stomach has been compressed by hematoma at the posterior wall. Borderline dilated small bowel throughout the abdomen. Wall enhancement appears within normal limits. No transition point identified. Appendix is not visualized, however, no inflammatory changes are present adjacent to the cecum to indicate an appendicitis. Colonic diverticula. Colon is relatively decompressed. Lymphatic: Multiple lymph nodes of the gastrohepatic ligament and liver hilum, as well as at the portacaval nodal stations, periaortic/preaortic nodal stations. Small lymph nodes of the inguinal region and the bilateral iliac nodal stations. Mesenteric: Interval increasing intermediate density fluid throughout the abdomen, right abdomen, left abdomen, and pelvis. Stranding within the mesentery. Reproductive: Fibroid uterus. Other: No hernia. Musculoskeletal: No acute displaced fracture. Surgical changes of the left hip. IMPRESSION: The CT angiogram is positive for active extravasation/hemorrhage into previously demonstrated peripancreatic hematoma related to necrotizing pancreatitis. Significant increased hematoma since the comparison CT, which now measures at least 12.6 cm in greatest diameter. These results were discussed by telephone at the time of interpretation on 04/22/2019 at 1:45pm with Dr. Rhetta Mura Increased free peritoneal fluid, compatible with peritoneal hemorrhage. Redemonstration of necrotizing pancreatitis superimposed on changes of chronic pancreatitis. This is the favored etiology of  multiple lymph nodes of the upper abdomen. Redemonstration of portal vein thrombus at the spleno portal confluence with occluded splenic vein. The splenic vein occlusion contributes to multiple collateral draining veins of the left upper quadrant, as above. Aortic atherosclerosis, mesenteric arterial disease, bilateral renal arterial disease, and bilateral iliofemoral disease. Aortic Atherosclerosis (ICD10-I70.0). Partially distended small bowel, potentially a developing ileus. Additional ancillary findings as above. Signed, Yvone Neu. Reyne Dumas, RPVI [facility] Electronically Signed   By: Gilmer Mor D.O.   On: 04/22/2019 14:16     Scheduled Meds:  diphenhydrAMINE  25 mg Oral Once   insulin aspart  0-9 Units Subcutaneous Q4H   mouth rinse  15 mL Mouth Rinse BID   nicotine  21 mg Transdermal Daily   pantoprazole (PROTONIX) IV  40 mg Intravenous Q24H   thiamine  100 mg Oral Daily   Continuous Infusions:  aztreonam Stopped (04/23/19 0556)   diltiazem (CARDIZEM) infusion 10 mg/hr (04/23/19 0458)   potassium chloride 10 mEq (04/23/19 0840)     LOS: 18 days   Time Spent in minutes  45  Pleas Koch, MD [facility] 9:02 AM

## 2019-04-23 NOTE — Progress Notes (Signed)
..  [facility] ADULT ICU REPLACEMENT PROTOCOL FOR AM LAB REPLACEMENT ONLY  The patient does apply for the [facility] Adult ICU Electrolyte Replacment Protocol based on the criteria listed below:   1. Is GFR >/= 40 ml/min? Yes.    Patient's GFR today is >60 2. Is urine output >/= 0.5 ml/kg/hr for the last 6 hours? Yes.   Patient's UOP is 2.7 ml/kg/hr 3. Is BUN < 60 mg/dL? Yes.    Patient's BUN today is 21 4. Abnormal electrolyte(s): K+ 3.15. Ordered repletion with: protocol 6. If a panic level lab has been reported, has the CCM MD in charge been notified? Yes.  .   Physician:  Dr.Deterding  Lolita Lenz 04/23/2019 4:16 AM

## 2019-04-24 ENCOUNTER — Inpatient Hospital Stay (HOSPITAL_COMMUNITY): Payer: PPO

## 2019-04-24 LAB — BPAM RBC
Blood Product Expiration Date: 202005292359
Blood Product Expiration Date: 202005312359
Blood Product Expiration Date: 202005312359
Blood Product Expiration Date: 202005312359
ISSUE DATE / TIME: 202005090952
ISSUE DATE / TIME: 202005091040
ISSUE DATE / TIME: 202005100024
ISSUE DATE / TIME: 202005101719
Unit Type and Rh: 5100
Unit Type and Rh: 5100
Unit Type and Rh: 5100
Unit Type and Rh: 5100

## 2019-04-24 LAB — GLUCOSE, CAPILLARY
Glucose-Capillary: 159 mg/dL — ABNORMAL HIGH (ref 70–99)
Glucose-Capillary: 134 mg/dL — ABNORMAL HIGH (ref 70–99)

## 2019-04-24 LAB — BPAM FFP
Blood Product Expiration Date: 202005142359
Blood Product Expiration Date: 202005142359
ISSUE DATE / TIME: 202005091511
ISSUE DATE / TIME: 202005091803
Unit Type and Rh: 5100
Unit Type and Rh: 9500

## 2019-04-24 LAB — PREPARE FRESH FROZEN PLASMA
Unit division: 0
Unit division: 0

## 2019-04-24 LAB — TYPE AND SCREEN
ABO/RH(D): O POS
Antibody Screen: NEGATIVE
Unit division: 0
Unit division: 0
Unit division: 0
Unit division: 0

## 2019-04-24 LAB — COMPREHENSIVE METABOLIC PANEL
ALT: 15 U/L (ref 0–44)
AST: 15 U/L (ref 15–41)
Albumin: 2.1 g/dL — ABNORMAL LOW (ref 3.5–5.0)
Alkaline Phosphatase: 80 U/L (ref 38–126)
Anion gap: 6 (ref 5–15)
BUN: 16 mg/dL (ref 8–23)
CO2: 24 mmol/L (ref 22–32)
Calcium: 7.5 mg/dL — ABNORMAL LOW (ref 8.9–10.3)
Chloride: 108 mmol/L (ref 98–111)
Creatinine, Ser: 0.33 mg/dL — ABNORMAL LOW (ref 0.44–1.00)
GFR calc Af Amer: >60 mL/min (ref >60)
GFR calc non Af Amer: >60 mL/min (ref >60)
Glucose, Bld: 175 mg/dL — ABNORMAL HIGH (ref 70–99)
Potassium: 3.3 mmol/L — ABNORMAL LOW (ref 3.5–5.1)
Sodium: 138 mmol/L (ref 135–145)
Total Bilirubin: 0.5 mg/dL (ref 0.3–1.2)
Total Protein: 5.1 g/dL — ABNORMAL LOW (ref 6.5–8.1)

## 2019-04-24 LAB — CBC WITH DIFFERENTIAL/PLATELET
Abs Immature Granulocytes: 0.62 10*3/uL — ABNORMAL HIGH (ref 0.00–0.07)
Basophils Absolute: 0.1 10*3/uL (ref 0.0–0.1)
Basophils Relative: 0 %
Eosinophils Absolute: 0 10*3/uL (ref 0.0–0.5)
Eosinophils Relative: 0 %
HCT: 27.5 % — ABNORMAL LOW (ref 36.0–46.0)
Hemoglobin: 9.4 g/dL — ABNORMAL LOW (ref 12.0–15.0)
Immature Granulocytes: 3 %
Lymphocytes Relative: 5 %
Lymphs Abs: 1 10*3/uL (ref 0.7–4.0)
MCH: 32.6 pg (ref 26.0–34.0)
MCHC: 34.2 g/dL (ref 30.0–36.0)
MCV: 95.5 fL (ref 80.0–100.0)
Monocytes Absolute: 1.3 10*3/uL — ABNORMAL HIGH (ref 0.1–1.0)
Monocytes Relative: 6 %
Neutro Abs: 18.9 10*3/uL — ABNORMAL HIGH (ref 1.7–7.7)
Neutrophils Relative %: 86 %
Platelets: 432 10*3/uL — ABNORMAL HIGH (ref 150–400)
RBC: 2.88 MIL/uL — ABNORMAL LOW (ref 3.87–5.11)
RDW: 16.5 % — ABNORMAL HIGH (ref 11.5–15.5)
WBC: 22 10*3/uL — ABNORMAL HIGH (ref 4.0–10.5)
nRBC: 0.1 % (ref 0.0–0.2)

## 2019-04-24 LAB — PROTIME-INR
INR: 1.3 — ABNORMAL HIGH (ref 0.8–1.2)
Prothrombin Time: 16 seconds — ABNORMAL HIGH (ref 11.4–15.2)

## 2019-04-24 LAB — MAGNESIUM: Magnesium: 1.8 mg/dL (ref 1.7–2.4)

## 2019-04-24 MED ORDER — FENTANYL BOLUS VIA INFUSION
25.0000 ug | INTRAVENOUS | Status: DC | PRN
  Filled 2019-04-24: qty 25

## 2019-04-24 MED ORDER — SODIUM CHLORIDE 0.9 % IV SOLN
3.0000 g | Freq: Once | INTRAVENOUS | Status: DC
  Filled 2019-04-24: qty 6

## 2019-04-24 MED ORDER — FENTANYL CITRATE (PF) 100 MCG/2ML IJ SOLN
50.0000 ug | INTRAMUSCULAR | Status: DC | PRN
  Administered 2019-04-24: 50 ug via INTRAVENOUS
  Administered 2019-04-24: 25 ug via INTRAVENOUS
  Filled 2019-04-24 (×2): qty 2

## 2019-04-24 MED ORDER — FENTANYL CITRATE (PF) 100 MCG/2ML IJ SOLN: 25.0000 ug | Freq: Once | INTRAMUSCULAR | Status: AC

## 2019-04-24 MED ORDER — BIOTENE DRY MOUTH MT LIQD: 15.0000 mL | OROMUCOSAL | Status: DC | PRN

## 2019-04-24 MED ORDER — FENTANYL BOLUS VIA INFUSION
25.0000 ug | INTRAVENOUS | Status: DC | PRN
  Administered 2019-04-24: 25 ug via INTRAVENOUS
  Administered 2019-04-24 (×3): 50 ug via INTRAVENOUS
  Administered 2019-04-25 (×3): 25 ug via INTRAVENOUS
  Administered 2019-04-26 (×6): 50 ug via INTRAVENOUS
  Filled 2019-04-24: qty 50

## 2019-04-24 MED ORDER — VITAMIN K1 10 MG/ML IJ SOLN
5.0000 mg | Freq: Once | INTRAVENOUS | Status: DC
  Filled 2019-04-24: qty 0.5

## 2019-04-24 MED ORDER — FENTANYL CITRATE (PF) 100 MCG/2ML IJ SOLN
25.0000 ug | INTRAMUSCULAR | Status: DC | PRN
  Administered 2019-04-24 (×3): 50 ug via INTRAVENOUS
  Filled 2019-04-24 (×3): qty 2

## 2019-04-24 MED ORDER — FENTANYL 2500MCG IN NS 250ML (10MCG/ML) PREMIX INFUSION
25.0000 ug/h | INTRAVENOUS | Status: DC
  Administered 2019-04-24: 25 ug/h via INTRAVENOUS
  Filled 2019-04-24 (×2): qty 250

## 2019-04-24 MED ORDER — HYDROMORPHONE HCL 1 MG/ML IJ SOLN
1.0000 mg | INTRAMUSCULAR | Status: DC | PRN
  Administered 2019-04-24 – 2019-04-26 (×5): 1 mg via INTRAVENOUS
  Filled 2019-04-24 (×5): qty 1

## 2019-04-24 MED ORDER — POTASSIUM CHLORIDE 10 MEQ/50ML IV SOLN
10.0000 meq | INTRAVENOUS | Status: DC
  Administered 2019-04-24 (×2): 10 meq via INTRAVENOUS
  Filled 2019-04-24 (×3): qty 50

## 2019-04-24 MED ORDER — LORAZEPAM 2 MG/ML IJ SOLN: 1.0000 mg | INTRAMUSCULAR | Status: DC | PRN

## 2019-04-24 MED ORDER — GLYCOPYRROLATE 0.2 MG/ML IJ SOLN
0.2000 mg | INTRAMUSCULAR | Status: DC | PRN
  Administered 2019-04-25 – 2019-04-26 (×2): 0.2 mg via INTRAVENOUS
  Filled 2019-04-24 (×4): qty 1

## 2019-04-24 MED ORDER — HYDROMORPHONE HCL 1 MG/ML IJ SOLN
1.0000 mg | Freq: Once | INTRAMUSCULAR | Status: AC
  Administered 2019-04-24: 1 mg via INTRAVENOUS
  Filled 2019-04-24: qty 1

## 2019-04-24 MED ORDER — GLYCOPYRROLATE 0.2 MG/ML IJ SOLN
0.2000 mg | INTRAMUSCULAR | Status: DC | PRN
  Filled 2019-04-24: qty 1

## 2019-04-24 MED ORDER — GLYCOPYRROLATE 1 MG PO TABS
1.0000 mg | ORAL_TABLET | ORAL | Status: DC | PRN
  Filled 2019-04-24: qty 1

## 2019-04-24 NOTE — Progress Notes (Signed)
Palliative care progress note  Reason for consult: Goals of care in light of abscess/necrotic pancreatitis  Discussed with Dr. Mahala Menghini.  Chart reviewed for last 24 hours and discussed with bedside RN.    Patient with acute change this AM.  Increased abdominal pain and appears to be transitioning to actively dying.  She is awake and alert and I discussed with her plan for comfort and focus on having family come to see her.  I recommended against CPR, intubation, and heroic interventions and she stated that she was in agreement with this plan.  Code status changed to DNR.  - Patient appears to be transitioning to actively dying.  Fentanyl infusion ordered.  Await family arrival and will continue to work toward transition to full comfort.  Judicious use of medications for symptom management at this point as she wants to be able to see family when they arrive.  Total time: 80 minutes Greater than 50%  of this time was spent counseling and coordinating care related to the above assessment and plan.  April Minus, MD [facility] Palliative Medicine Team [phone]

## 2019-04-24 NOTE — Progress Notes (Addendum)
Received page from night chaplain re: patient actively dying.  Chaplain present on unit for support as care team is meeting with patient.  Daughter and family in route.  Will consult with A/C regarding patient restrictions, as patient has 3 adult grandchildren, daughter and son-in-law.    Chaplain Clint Bolder at bedside.  Handing off to [place] for care with family as they arrive.

## 2019-04-24 NOTE — Progress Notes (Addendum)
PT Cancellation Note  Patient Details Name: April [name] MRN: [id number] DOB: 15-Jan-1946   Cancelled Treatment:    Reason Eval/Treat Not Completed: Medical issues which prohibited therapy Addendum: Pt has now been made comfort care. Will sign off    Rebeca Alert, PT Acute Rehabilitation Services Pager: [phone] Office: [phone]

## 2019-04-24 NOTE — Progress Notes (Signed)
PROGRESS NOTE    April [name]  VQQ:[id number] DOB: 1946/06/04 DOA: Apr 29, 2019 PCP: Richmond Campbell., PA-C   Brief Narrative:    73 y.o. CF  bipolar, continued tobacco use, ,alcohol abuse, GERD, hypertension, hyperlipidemia, pancreatitis, recent left intertrochanteric femur fracture status post intervention with intramedullary nail placement on 03/22/2019 presenting to the hospital for evaluation of hip pain and UTI symptoms. Limited history on admission  States she drinks 3 shot glasses of whiskey every day.  She is not able to tell me when her last drink was.  Also reports smoking 1 pack of cigarettes every day.  Ultimately found after admitted with sepsis secondary to acute pancreatitis and urinary tract infection.  Her hospitalization has been complicated by stress-induced cardiomyopathy, acute cardiogenic pulmonary edema and acute hypoxic respiratory failure.  On 5/9 after being seen by surgery just the day before for an enlarging pseudocyst the patient had a catastrophic bleed from the left gastric artery requiring emergent intervention by IR and multiple blood transfusions  General surgery IR gastroenterology and critical care were involved at that time She has stabilized on the stepdown unit but is now in rapid A. Fib  She was made comfort care on 5/11  Assessment & Plan  Hypovolemic shock Left gastric artery bleed secondary to erosion from pseudocyst Exceedingly poor prognosis although patient has stabilized New onset A. fib likely reactive to above Not a candidate for anticoagulation Controlled with Cardizem IV GTT and metoprolol pushes Acute pancreatitis complicated with pseudocyst and portal vein thrombosis -CT abdomen pelvis showed nonocclusive splenic vein/portal vein thrombus Have to hold all blood thinners at this time given hemorrhagic shock Sepsis secondary to urinary tract infection/Proteus mirabilis and Enterobacter  -Sepsis on admission, recurrent sepsis  secondary to pseudocyst-now comfort care stop all antibiotics Acute systolic heart failure/stress-induced cardiomyopathy with acute cardiogenic pulmonary edema/acute hypoxic and hypercapnic respiratory failure -Secondary to hypovolemic shock-now holding all aggressive treatments Hyperglycemia Renal/electrolytes Hypocalcemia Alcohol abuse with potential of withdrawal Resolved Severe calorie protein malnutrition -Discussed with daughter Acute metabolic encephalopathy/ sundowning She was clear today We had a good conversation before palliative care came by and she was somewhat confused about about what she should do I explained to her that she is in the active process of dying at the time that I saw her at around 8:00 this morning  She was fearful and the nurse and I spent about 10 to 15 minutes trying to discuss the nuances of what could be done for her to give her maximal comfort and reintroduced palliative measures and then at that time Dr. Neale Burly with palliative care and chaplain were available I am transferring her to a palliative floor as she is in the process of dying and I spent 15 minutes extra with the family this morning going over the course of events leading up to this and was open to discussion with them about any options and events that transpired I expect imminent hospital demise later on tonight   Consultants Gastroenterology Palliative care General surgery Interventional radiology  Procedures  Embolization of left gastric artery 5/9    Subjective:   Awake alert and in severe pain when I saw her this morning despite recent fentanyl dose No report of stool overnight, no nausea no vomiting  Objective:   Vitals:   04/24/19 1030 04/24/19 1045 04/24/19 1156 04/24/19 1500  BP:      Pulse: (!) 110 (!) 111 (!) 104 (!) 107  Resp: 16 14 12 13   Temp:  TempSrc:      SpO2: (!) 56% (!) 24% (!) 28% (!) 45%  Weight:      Height:        Intake/Output Summary (Last  24 hours) at 04/24/2019 1712 Last data filed at 04/24/2019 1635 Gross per 24 hour  Intake 2301.94 ml  Output 1550 ml  Net 751.94 ml   Filed Weights   04/22/19 0541 04/23/19 0500 04/24/19 0500  Weight: 44.2 kg 46.6 kg 48.6 kg   Exam  Kussmaul breathing, lucid JVD elevated S1-S2 no murmur although tachycardic Abdomen significantly distended from prior exquisitely tender over left upper quadrant and epigastrium   Data Reviewed: Potassium 3.3 BUN/creatinine down WBC down to 22 hemoglobin up to 9.4 platelets 432 Radiology Studies: X-ray of abdomen showed no definite evidence of bowel obstruction or ileus Scheduled Meds: . mouth rinse  15 mL Mouth Rinse BID  . nicotine  21 mg Transdermal Daily   Continuous Infusions: . fentaNYL infusion INTRAVENOUS 50 mcg/hr (04/24/19 1635)     LOS: 19 days   Time Spent in minutes  45  Pleas KochJai Drue Harr, MD [facility] 5:12 PM

## 2019-04-24 NOTE — Progress Notes (Signed)
   04/24/19 1640  Clinical Encounter Type  Visited With Patient and family together  Visit Type Follow-up;Spiritual support  Referral From Chaplain  Consult/Referral To Chaplain  Spiritual Encounters  Spiritual Needs Prayer  The chaplain was pastorally present with family.  The chaplain listened to the daughter describe the Pt.'s role in her family as an opening to prayer.  The chaplain checked in with the RN-Angela and confirmed the Pt. move to Room 1514.  The chaplain is available for F/U spiritual care as needed.

## 2019-04-24 NOTE — Progress Notes (Signed)
eLink Physician-Brief Progress Note Patient Name: April [name] DOB: May 31, 1946 MRN: [id number]   Date of Service  04/24/2019  HPI/Events of Note  K+ = 3.3 and Creatinine = 0.33.   eICU Interventions  Will replace K+.      Intervention Category Major Interventions: Electrolyte abnormality - evaluation and management  Bridgett Hattabaugh Eugene 04/24/2019, 5:32 AM

## 2019-04-24 NOTE — Progress Notes (Signed)
   04/24/19 0900  Clinical Encounter Type  Visited With Patient and family together  Visit Type Initial;Psychological support;Spiritual support;Critical Care;Patient actively dying  Referral From Nurse  Consult/Referral To Chaplain  Spiritual Encounters  Spiritual Needs Emotional;Other (Comment) (Spiritual Care Conversation/Support)  Stress Factors  Patient Stress Factors Health changes;Other (Comment) (Pain)  Family Stress Factors Health changes;Major life changes   I provided spiritual care for the patient and for her family once they arrived. Ms. Raitt was in pain and found comfort having someone at her bedside.  I provided Ms. Chopin with a prayer shawl.   Please, contact Spiritual Care for further assistance.   Chaplain Clint Bolder M.Div., [facility]

## 2019-04-24 NOTE — Progress Notes (Signed)
Patient in severe pain despite 50 fentanyl See RN notes Long discussion with Reita Cliche daughter, ~ 10 min  Can considere plain films abd-  explained we have done many things to [facility], and that we can add time, but the suffering in the time added might outweigh any therapeutic benefit/longevity.  I have advocated that we change Mrs Bazer to DNR, and have alerted Dr. Neale Burly to kindly follow up with discussions about this when Reita Cliche arrives.  20 min care coordination time Full note to follow  Pleas Koch, MD [facility] 8:29 AM

## 2019-04-24 NOTE — Progress Notes (Signed)
This chaplain introduced herself to Pt. Daughter-Bobbi and visiting family.  The chaplain offered spiritual care in the context of companionship.  The family requested a F/U visit, but no needs at this time.

## 2019-04-24 NOTE — Progress Notes (Signed)
eLink Physician-Brief Progress Note Patient Name: April [name] DOB: 12/15/1945 MRN: [id number]   Date of Service  04/24/2019  HPI/Events of Note  Patient c/o pain.   eICU Interventions  Will increase the Fentanyl dose to 25-50 mcg IV Q 2 hours PRN pain..      Intervention Category Intermediate Interventions: Pain - evaluation and management  Sommer,Steven Eugene 04/24/2019, 2:00 AM

## 2019-04-24 NOTE — Progress Notes (Signed)
Patient's hospital computer chart was reviewed and patient's case discussed with my partner Dr. Marca Ancona and unfortunately she has been made comfort care only and is near death according to the nurse and please let me know if I can be of any assistance with the remainder of her hospital care

## 2019-04-24 NOTE — Progress Notes (Signed)
Called into room on 3 multiple occasions by family stated patient is in pain. Patient moaning and grimacing. Oxygen dropped to 76%. O2 applied at 2L. Oxygen level now at 98%. Fentayl drip running. Fentanyl Prn given at 2129. Effective only temporary. Called into room again family asking for pain medicine. 25 mcg bolus given at 2246. At 2311, family called RN into room patient said "Please medicine". Dilaudid 1mg  given. Will continue to monitor.

## 2019-04-25 LAB — CULTURE, BLOOD (SINGLE): Special Requests: ADEQUATE

## 2019-04-25 NOTE — Progress Notes (Signed)
Patient appears comfortable after receiving Robinul and much appreciate palliative medicine's involvement Family is concerned about being able to return to the floor and have multiple pressing issues to take care of at home so I have reached out to the charge nurse to ensure that they can return In the unlikely event that patient does survive beyond today I have asked case manager to look in on the patient for possible transition to freestanding hospice  I had good a good chat with the daughter and son-in-law about their experiences and they are very grateful to the care that has been provided to them and do understand all aspects of her current care  Pleas Koch, MD [facility] 11:09 AM   No charge

## 2019-04-25 NOTE — TOC Progression Note (Signed)
Transition of Care [facility]) - Progression Note    Patient Details  Name: [name] MRN: [id number] Date of Birth: 08-Jul-1946  Transition of [facility] CM/SW Contact  Coralyn Helling, [place] Phone Number: 04/25/2019, 10:38 AM  Clinical Narrative:   Patient actively dying per palliative note. LCSW signing off.     Expected Discharge Plan: Home w Home Health Services Barriers to Discharge: Continued Medical Work up  Expected Discharge Plan and Services Expected Discharge Plan: Home w Home Health Services In-house Referral: Clinical Social Work   Post Acute Care Choice: Home Health(vs SNF) Living arrangements for the past 2 months: Single Family Home Expected Discharge Date: (unknown)                         HH Arranged: ([facility]         Social Determinants of Health (SDOH) Interventions    Readmission Risk Interventions No flowsheet data found.

## 2019-04-25 NOTE — Progress Notes (Signed)
Nutrition Brief Note RD working remotely.   Patient last seen remotely by this RD on 5/6. Chart reviewed. Palliative Care following and note from today states that patient is actively dying.  No further nutrition interventions warranted at this time.      Trenton Gammon, MS, RD, LDN, [facility] Inpatient Clinical Dietitian Pager # [phone] After hours/weekend pager # [phone]

## 2019-04-25 NOTE — Care Management Important Message (Signed)
Important Message  Patient Details IM Letter given to Coralyn Helling SW to present to the Patient  Name: LOLISA MUSCH MRN: [id number] Date of Birth: December 13, 1946   Medicare Important Message Given:  Yes    Caren Macadam 04/25/2019, 9:59 AM

## 2019-04-25 NOTE — Progress Notes (Signed)
Daily Progress Note   Patient Name: April [name]       Date: 04/25/2019 DOB: [birth year]  Age: 73 y.o. MRN#: [id number] Attending Physician: Rhetta Mura, MD Primary Care Physician: Richmond Campbell., PA-C Admit Date: 04-07-2019  Reason for Consultation/Follow-up: Establishing goals of care  Subjective: Patient actively dying. Unresponsive to sternal rub. Appears comfortable with shallow, regular respirations. No grimacing or signs of pain/discomfort. Fentanyl gtt infusing.  Daughter and son-in-law at bedside. Discussed symptom management medications and EOL expectations. They are prepared for 'anything to happen at any time' with goal to ensure she is comfortable at EOL. Answered questions. Emotional/spiritual support provided. PMT contact information given. Discussed with RN.    Length of Stay: 20  Current Medications: Scheduled Meds:  . mouth rinse  15 mL Mouth Rinse BID  . nicotine  21 mg Transdermal Daily    Continuous Infusions: . fentaNYL infusion INTRAVENOUS 50 mcg/hr (04/25/19 0406)    PRN Meds: antiseptic oral rinse, fentaNYL, fentaNYL (SUBLIMAZE) injection, glycopyrrolate **OR** glycopyrrolate **OR** glycopyrrolate, HYDROmorphone (DILAUDID) injection, LORazepam  Physical Exam Vitals signs and nursing note reviewed.  Constitutional:      Appearance: She is cachectic. She is ill-appearing.  HENT:     Head: Normocephalic and atraumatic.  Cardiovascular:     Rate and Rhythm: Normal rate.  Pulmonary:     Effort: No tachypnea, accessory muscle usage or respiratory distress.     Comments: Audible secretions. Instructed RN to give robinul. Abdominal:     Tenderness: There is no abdominal tenderness.  Skin:    General: Skin is warm and dry.     Coloration:  Skin is pale.  Neurological:     Mental Status: She is unresponsive.            Vital Signs: BP 124/62   Pulse (!) 107   Temp 98.1 F (36.7 C) (Oral)   Resp 13   Ht 5\' 6"  (1.676 m)   Wt 48.6 kg   SpO2 98%   BMI 17.29 kg/m  SpO2: SpO2: 98 % O2 Device: O2 Device: Nasal Cannula O2 Flow Rate: O2 Flow Rate (L/min): 2 L/min  Intake/output summary:   Intake/Output Summary (Last 24 hours) at 04/25/2019 0852 Last data filed at 04/25/2019 0406 Gross per 24 hour  Intake 328.49 ml  Output -  Net  328.49 ml   LBM: Last BM Date: 04/21/19 Baseline Weight: Weight: 49.9 kg Most recent weight: Weight: 48.6 kg       Palliative Assessment/Data: PPS 10%   Flowsheet Rows     Most Recent Value  Intake Tab  Referral Department  Hospitalist  Unit at Time of Referral  Med/Surg Unit  Palliative Care Primary Diagnosis  Sepsis/Infectious Disease  Palliative Care Type  New Palliative care  Reason for referral  Clarify Goals of Care  Date first seen by Palliative Care  04/18/19  Clinical Assessment  Palliative Performance Scale Score  10%  Psychosocial & Spiritual Assessment  Palliative Care Outcomes  Patient/Family meeting held?  Yes  Who was at the meeting?  daughter, son-in-law  Palliative Care Outcomes  Clarified goals of care, Provided end of life care assistance, Provided psychosocial or spiritual support, Improved pain interventions, Improved non-pain symptom therapy, Changed to focus on comfort      Patient Active Problem List   Diagnosis Date Noted  . Pressure injury of skin 04/23/2019  . Acute blood loss anemia 04/22/2019  . Shock circulatory (HCC) 04/22/2019  . Pleural effusion, bilateral  L >R  04/22/2019  . Palliative care by specialist   . Goals of care, counseling/discussion   . Stress-induced cardiomyopathy 04/09/2019  . Respiratory failure (HCC) hypoxic and hypercapnic/ acute 04/09/2019  . Sepsis (HCC) 03/25/2019  . Acute pancreatitis 03/25/2019  . UTI (urinary  tract infection) 03/24/2019  . Pancreatic pseudocyst 03/21/2019  . Portal vein thrombosis 03/20/2019  . Hip fracture (HCC) 03/21/2019  . Displaced intertrochanteric fracture of left femur, initial encounter for closed fracture (HCC)   . Anaphylactic reaction 07/24/2018  . FTT (failure to thrive) in adult 05/18/2018  . Falls 05/18/2018  . Pancreatitis 10/05/2017  . Frequent falls 02/11/2017  . Protein-calorie malnutrition, severe 02/05/2017  . Gallstone pancreatitis 01/30/2017  . Hypokalemia 01/30/2017  . Macrocytic anemia 01/30/2017  . Metabolic acidosis 01/30/2017  . Neuropathy, alcoholic (HCC) 09/08/2016  . Neuropathy 12/11/2015  . Alcohol abuse 12/11/2015  . Tobacco abuse 12/11/2015  . Chronic venous insufficiency 09/13/2015  . Extremity numbness 09/13/2015    Palliative Care Assessment & Plan   Patient Profile: 73 y.o. female  with past medical history of anxiety, depression, ETOH and tobacco use, GERD, HTN, HLD, pancreatitis and recent left intertrochanteric femur fracture s/p intramedullary nailing on 03/22/19  admitted on 04/01/2019 with hip pain and UTI symptoms. Hospital admission for sepsis secondary to UTI and acute pancreatitis. Hospital course complicated by stress-induced cardiomyopathy with EF 35-40%, acute cardiogenic pulmonary edema, acute respiratory failure, and recurrent fevers. Blood cultures 4/22 showed proteus mirabilis and negative on 4/26. Repeat blood cultures 5/4 positive for GNR. Patient also with portal vein thrombosis. GI consulted and recommending soft diet with 3-6 months anticoagulation. Receiving Eliquis. Severe protein calorie malnutrition likely due to alcoholism; daughter reports 1-3 shot glasses per day of brandy. Palliative medicine consultation for goals of care.   Assessment: Sepsis UTI Proteus mirabilis and GNR bacteremia Acute systolic heart failure/stress-induced cardiomyopathy Acute cardiogenic pulmonary edema Acute hypoxic and hypercapnic  respiratory failure Acute pancreatitis complicated with pseudocyst and portal vein thrombosis Hypovolemic shock Left gastric artery bleed secondary to erosion of pseudocyst ETOH abuse  Acute metabolic encephalopathy Severe protein calorie malnutrition Deconditioning   Recommendations/Plan:  DNR/DNI, comfort measures only.  Continue fentanyl infusion and prn comfort meds to ensure comfort.   Frequent oral care.  Lift visitor restriction to allow family to visit patient at EOL.  Appreciate chaplain  support.  Actively dying. Anticipate hospital death.  Code Status: DNR   Code Status Orders  (From admission, onward)         Start     Ordered   Apr 11, 2019 2031  Full code  Continuous     2019-04-11 2031        Code Status History    Date Active Date Inactive Code Status Order ID Comments User Context   03/21/2019 2041 03/24/2019 1625 Full Code [id number]  Meredeth Ide, MD Inpatient   07/24/2018 2043 07/26/2018 1546 Full Code [id number]  Tonye Royalty, DO Inpatient   10/05/2017 0158 10/08/2017 1916 Full Code [id number]  Clydie Braun, MD ED   04/09/2017 2020 04/13/2017 1904 Full Code [id number]  Michael Litter, MD ED   02/11/2017 0851 02/12/2017 1705 Full Code [id number]  Haydee Salter, MD ED   01/30/2017 0305 02/06/2017 2122 Full Code [id number]  Danford, Earl Lites, MD Inpatient       Prognosis:   Hours-days  Discharge Planning:  Anticipated Hospital Death  Care plan was discussed with daughter, son-in-law, RN  Thank you for allowing the Palliative Medicine Team to assist in the care of this patient.   Time In: 0830 Time Out: 0855 Total Time 25 Prolonged Time Billed no      Greater than 50%  of this time was spent counseling and coordinating care related to the above assessment and plan.  Vennie Homans, DNP, FNP-C Palliative Medicine Team  Phone: [phone] Fax: [phone]  Please contact Palliative Medicine Team phone at [phone] for questions and concerns.

## 2019-04-26 DIAGNOSIS — R579 Shock, unspecified: Secondary | ICD-10-CM

## 2019-04-26 DIAGNOSIS — K56609 Unspecified intestinal obstruction, unspecified as to partial versus complete obstruction: Secondary | ICD-10-CM

## 2019-04-26 DIAGNOSIS — K631 Perforation of intestine (nontraumatic): Secondary | ICD-10-CM

## 2019-04-26 MED ORDER — ACETAMINOPHEN 650 MG RE SUPP
650.0000 mg | Freq: Four times a day (QID) | RECTAL | Status: DC | PRN
  Administered 2019-04-26: 650 mg via RECTAL
  Filled 2019-04-26: qty 1

## 2019-04-27 DIAGNOSIS — K56609 Unspecified intestinal obstruction, unspecified as to partial versus complete obstruction: Secondary | ICD-10-CM

## 2019-05-15 NOTE — Death Summary Note (Signed)
Death Summary  April [name] KMQ:[id number] DOB: 08-Jan-1946 DOA: 04/24/2019  PCP: Richmond Campbell., PA-C PCP/Office notified:   Admit date: 04-24-2019 Date of Death: 05-15-2019 @ 2127/04/19 hours  Final Diagnoses:  Principal Problem:   Sepsis (HCC) Active Problems:   Acute pancreatitis   UTI (urinary tract infection)   Pseudocyst of pancreas   Portal vein thrombosis   Stress-induced cardiomyopathy   Respiratory failure (HCC) hypoxic and hypercapnic/ acute   Palliative care by specialist   Terminal care   Acute blood loss anemia   Shock circulatory (HCC)   Pleural effusion, bilateral  L >R    Pressure injury of skin   Perforated bowel (HCC)    History of present illness:  Per Dr. Antony Contras is a 73 y.o. female with medical history significant of anxiety, tobacco use, depression, alcohol abuse, GERD, hypertension, hyperlipidemia, pancreatitis, recent left intertrochanteric femur fracture status post intervention with intramedullary nail placement on 03/22/2019 presenting to the hospital for evaluation of hip pain and UTI symptoms. History provided by patient very limited.  Reported generalized weakness, dysuria, urinary frequency, and urgency.  Denied any flank pain.  She was not able to give me a timeline of when the symptoms started.  Also reported suprapubic abdominal pain and right-sided hip pain.  Not complaining of any left hip pain.  Denied any cough or shortness of breath.  Denied any fevers, chills, sick contacts, recent travel, or exposure to any individual with confirmed COVID-19.  Stated she drinks 3 shot glasses of whiskey every day.  She is not able to tell me when her last drink was.  Also reported smoking 1 pack of cigarettes every day.  History per ED provider conversation with the patient's daughter: "Daughter states over the last few days she had been complaining that her right side hurt but today was the worst with worsening pain in her right side and  abdomen, fever, mild confusion and severe anorexia. Daughter states that since being home from the hospital she has not felt great and has never been a big eater but today was much worse. Unclear when patient's last bowel movement was. During her last hospitalization she did have severe electrolyte abnormalities which was thought to be related to alcohol abuse. Daughter states she is still drinking at home. She typically drinks brandy and drinks approximately 1-3 shot glasses per day. In the past she has had alcohol withdrawal."  Hospital Course:  73 y.o.CFbipolar, continued tobacco use, ,alcohol abuse, GERD, hypertension, hyperlipidemia, pancreatitis, recent left intertrochanteric femur fracture status post intervention with intramedullary nail placement on 4/8/2020presenting to the hospital for evaluation of hip pain and UTI symptoms. Limited history on admission  States she drinks 3 shot glasses of whiskey every day. She is not able to tell me when her last drink was. Also reports smoking 1 pack of cigarettes every day.  Ultimately found after admitted with sepsis secondary to acute pancreatitis and urinary tract infection. Her hospitalization has been complicated by stress-induced cardiomyopathy, acute cardiogenic pulmonary edema and acute hypoxic respiratory failure.  On 5/9 after being seen by surgery just the day before for an enlarging pseudocyst the patient had a catastrophic bleed from the left gastric artery requiring emergent intervention by IR and multiple blood transfusions  General surgery IR gastroenterology and critical care were involved at that time She has stabilized on the stepdown unit but is now in rapid A. Fib  She was made comfort care on 5/11 and palliative care follow the  patient throughout the hospitalization.  Patient subsequently was pronounced dead at 2128 hrs. on 05/10/2019. May his soul rest in peace.  Hypovolemic shock Left gastric artery bleed  secondary to erosion from pseudocyst Exceedingly poor prognosis although patient has stabilized.  Patient transitioned to comfort measures. New onset A. fib likely reactive to above Not a candidate for anticoagulation Controlled with Cardizem IV GTT and metoprolol pushes.  Patient now has been transitioned to comfort measures. Acute pancreatitis complicated with pseudocyst and portal vein thrombosis -CT abdomen pelvis showed nonocclusive splenic vein/portal vein thrombus Blood thinners at this time given hemorrhagic shock.  Patient was subsequently  transitioned to comfort measures. Sepsis secondary to urinary tract infection/Proteus mirabilis and Enterobacter  -Sepsis on admission, recurrent sepsis secondary to pseudocyst-now comfort care.  Antibiotics have been discontinued.  Acute systolic heart failure/stress-induced cardiomyopathy with acute cardiogenic pulmonary edema/acute hypoxic and hypercapnic respiratory failure -Secondary to hypovolemic shock-now holding all aggressive treatments. -Patient now for comfort measures.  Patient on fentanyl drip.  Patient currently comfortable.  Expected in-hospital death. Hyperglycemia Renal/electrolytes Hypocalcemia Alcohol abuse with potential of withdrawal Resolved Severe calorie protein malnutrition -Patient now for comfort measures.  Acute metabolic encephalopathy/ sundowning Patient noted to be actively dying.  Patient condition deteriorated patient became unresponsive and transition to full comfort measures.  Palliative care followed the patient during the hospitalization.  Patient was kept comfortable.   Patient subsequently deteriorated and was pronounced dead at 2128 hrs. on 04/16/2019.  May her soul rest in peace.      Time: 25 mins  No charge  Signed:  Ramiro HarvestDaniel Thompson  [facility] 04/27/2019, 8:27 AM

## 2019-05-15 NOTE — Progress Notes (Signed)
Pt expired at 2128 pronounced by 2 RNs. Death was expected and pt was full comfort care. Death certificate completed and given to [place].  KJKG, NP [facility]

## 2019-05-15 NOTE — Progress Notes (Signed)
Postmortem Note:  Incident: Death  Assessment: On assessment patient had no respirations or heart sounds. Assessed patient pulse, which resulted in being pulseless. Extremities were cold to touch and mottled.   Confirmation: Allyn Kenner, RN ) RN confirmed these findings as well.  Marena Chancy RN 2nd witness.   Time of Death: 9:28pm  [employer] [facility] [facility]: notified,  Sharlene Motts out  referal number: [phone] [place]  Family: At bedside   [facility]: Daughter reports no funeral home has been chosen.   Morgue:    Chaplin: Notified.  Patient Placement: Notified  Allyn Kenner, RN  Phone Number: [phone]

## 2019-05-15 NOTE — Progress Notes (Signed)
   05/14/2019 1400  Clinical Encounter Type  Visited With Patient;Family  Visit Type Follow-up;Psychological support;Spiritual support  Referral From Family  Consult/Referral To Chaplain  Spiritual Encounters  Spiritual Needs Emotional;Other (Comment) (Spiritual Care Conversation/Support)  Stress Factors  Patient Stress Factors None identified   I followed up with the patient per a previous visit with her yesterday. No needs were present at this time. I will continue to follow this patient and her family.   Please, contact Spiritual Care for further assistance.   Chaplain Clint Bolder M.Div. [facility]

## 2019-05-15 NOTE — Progress Notes (Signed)
PROGRESS NOTE    April [name]  OTL:[id number] DOB: May 06, 1946 DOA: 05/14/2019 PCP: Richmond Campbell., PA-C    Brief Narrative:  73 y.o.CF bipolar, continued tobacco use, ,alcohol abuse, GERD, hypertension, hyperlipidemia, pancreatitis, recent left intertrochanteric femur fracture status post intervention with intramedullary nail placement on 4/8/2020presenting to the hospital for evaluation of hip pain and UTI symptoms. Limited history on admission  States she drinks 3 shot glasses of whiskey every day. She is not able to tell me when her last drink was. Also reports smoking 1 pack of cigarettes every day.  Ultimately found after admitted with sepsis secondary to acute pancreatitis and urinary tract infection.  Her hospitalization has been complicated by stress-induced cardiomyopathy, acute cardiogenic pulmonary edema and acute hypoxic respiratory failure.  On 5/9 after being seen by surgery just the day before for an enlarging pseudocyst the patient had a catastrophic bleed from the left gastric artery requiring emergent intervention by IR and multiple blood transfusions  General surgery IR gastroenterology and critical care were involved at that time She has stabilized on the stepdown unit but is now in rapid A. Fib  She was made comfort care on 5/11   Assessment & Plan:   Principal Problem:   Sepsis (HCC) Active Problems:   Acute pancreatitis   UTI (urinary tract infection)   Pseudocyst of pancreas   Portal vein thrombosis   Stress-induced cardiomyopathy   Respiratory failure (HCC) hypoxic and hypercapnic/ acute   Palliative care by specialist   Terminal care   Acute blood loss anemia   Shock circulatory (HCC)   Pleural effusion, bilateral  L >R    Pressure injury of skin  Hypovolemic shock Left gastric artery bleed secondary to erosion from pseudocyst Exceedingly poor prognosis although patient has stabilized.  Patient transitioned to comfort measures.  New onset A. fib likely reactive to above Not a candidate for anticoagulation Controlled with Cardizem IV GTT and metoprolol pushes.  Patient now has been transitioned to comfort measures. Acute pancreatitis complicated with pseudocyst and portal vein thrombosis -CT abdomen pelvis showed nonocclusive splenic vein/portal vein thrombus Have to hold all blood thinners at this time given hemorrhagic shock.  Patient now has been transitioned to comfort measures. Sepsis secondary to urinary tract infection/Proteus mirabilis and Enterobacter  -Sepsis on admission, recurrent sepsis secondary to pseudocyst-now comfort care.  Antibiotics have been discontinued.  Acute systolic heart failure/stress-induced cardiomyopathy with acute cardiogenic pulmonary edema/acute hypoxic and hypercapnic respiratory failure -Secondary to hypovolemic shock-now holding all aggressive treatments. -Patient now for comfort measures.  Patient on fentanyl drip.  Patient currently comfortable.  Expected in-hospital death. Hyperglycemia Renal/electrolytes Hypocalcemia Alcohol abuse with potential of withdrawal Resolved Severe calorie protein malnutrition -Patient now for comfort measures.  Acute metabolic encephalopathy/ sundowning Patient noted to be actively dying.  Patient currently unresponsive and on full comfort measures.  Palliative care following.  Expected in hospital death.  Appreciate palliative care input and recommendations.      DVT prophylaxis: Comfort care Code Status: DNR Family Communication: Updated daughter and son-in-law. Disposition Plan: Likely in hospital death.   Consultants:  Gastroenterology Palliative care General surgery Interventional radiology  Procedures:   Transfused 4 units packed red blood cells  Transfused 2 units FFP  Peripheral left gastric artery micro call and Gelfoam embolization and active bleeding site per Dr. Miles Costain 04/22/2019  Plain films of bilateral hips for  08/03/2019  2D echo 04/07/2019  CT angiogram abdomen and pelvis 04/22/2019  CT abdomen and pelvis 04/20/2019, 04/24/2019  Antimicrobials:   IV aztreonam 04/18/2019>>>> 04/24/2019   Subjective: Unresponsive.  Agonal breaths.  Objective: Vitals:   04/25/19 1315 04/25/19 1840 05/03/2019 0012 04/25/2019 0900  BP: (!) 90/52 (!) 99/55 (!) 109/57 (!) 101/54  Pulse: (!) 107 (!) 121 (!) 129 (!) 129  Resp: 14 (!) 32 (!) 22 (!) 38  Temp:  99.5 F (37.5 C) 98.5 F (36.9 C) (!) 103.3 F (39.6 C)  TempSrc:  Axillary Oral Axillary  SpO2: (!) 68% (!) 67% (!) 84% (!) 85%  Weight:      Height:        Intake/Output Summary (Last 24 hours) at 05/04/2019 1254 Last data filed at 04/19/2019 0733 Gross per 24 hour  Intake 144.5 ml  Output -  Net 144.5 ml   Filed Weights   04/22/19 0541 04/23/19 0500 04/24/19 0500  Weight: 44.2 kg 46.6 kg 48.6 kg    Examination:  General exam: Unresponsive.  Agonal breaths. Respiratory system: Agonal breaths.  Clear to auscultation anterior lung fields.  Cardiovascular system: Tachycardia.  No JVD, no murmurs, no rubs, no gallops.  No lower extremity edema.  Gastrointestinal system: Abdomen is nondistended, soft and nontender. No organomegaly or masses felt. Normal bowel sounds heard. Central nervous system: Alert and oriented. No focal neurological deficits. Extremities: Symmetric 5 x 5 power. Skin: No rashes, lesions or ulcers Psychiatry: Judgement and insight unable to assess as patient unresponsive.    Data Reviewed: I have personally reviewed following labs and imaging studies  CBC: Recent Labs  Lab 04/22/19 2205 04/23/19 0243 04/23/19 0523 04/23/19 1100 04/23/19 1541 04/23/19 2203 04/24/19 0438  WBC 25.9* 27.5* 25.9*  --   --  20.6* 22.0*  NEUTROABS 22.5* 23.9* 22.2*  --   --  16.8* 18.9*  HGB 6.0* 8.2* 7.9* 7.3* 7.1* 8.3* 9.4*  HCT 17.6* 24.4* 22.6* 21.3* 20.6* 25.0* 27.5*  MCV 94.6 96.1 94.2  --   --  95.4 95.5  PLT 486* 484* 434*  --   --   405* 432*   Basic Metabolic Panel: Recent Labs  Lab 04/20/19 0919  04/22/19 0537 04/23/19 0243 04/23/19 0523 04/23/19 1034 04/23/19 1541 04/24/19 0438  NA 136   < > 133* 139 139  --  140 138  K 3.1*   < > 4.3 3.1* 3.2*  --  3.3* 3.3*  CL 103   < > 98 106 105  --  108 108  CO2 26   < > 17* 23 24  --  23 24  GLUCOSE 155*   < > 208* 186* 159*  --  105* 175*  BUN 14   < > --  19 16  CREATININE <0.30*   < > 0.58 0.49 0.46  --  0.37* 0.33*  CALCIUM 8.4*   < > 7.8* 7.6* 7.6*  --  7.6* 7.5*  MG 1.7  --   --   --   --  1.5*  --  1.8   < > = values in this interval not displayed.   GFR: Estimated Creatinine Clearance: 49.5 mL/min (A) (by C-G formula based on SCr of 0.33 mg/dL (L)). Liver Function Tests: Recent Labs  Lab 04/20/19 0919 04/21/19 0504 04/22/19 0537 04/23/19 0243 04/24/19 0438  AST ALT ALKPHOS 136* 136* 108 81 80  BILITOT 0.4 1.0 0.7 0.8 0.5  PROT 5.2* 5.7* 5.1* 5.0* 5.1*  ALBUMIN 2.0* 2.2* 1.9* 2.3* 2.1*  Recent Labs  Lab 04/21/19 0504  LIPASE 203*   No results for input(s): AMMONIA in the last 168 hours. Coagulation Profile: Recent Labs  Lab 04/22/19 0537 04/22/19 1327 04/23/19 1541 04/24/19 0438  INR 2.1* 1.8* 1.4* 1.3*   Cardiac Enzymes: No results for input(s): CKTOTAL, CKMB, CKMBINDEX, TROPONINI in the last 168 hours. BNP (last 3 results) No results for input(s): PROBNP in the last 8760 hours. HbA1C: No results for input(s): HGBA1C in the last 72 hours. CBG: Recent Labs  Lab 04/23/19 1547 04/23/19 1933 04/23/19 2344 04/24/19 0337 04/24/19 0752  GLUCAP 105* 156* 127* 159* 134*   Lipid Profile: No results for input(s): CHOL, HDL, LDLCALC, TRIG, CHOLHDL, LDLDIRECT in the last 72 hours. Thyroid Function Tests: No results for input(s): TSH, T4TOTAL, FREET4, T3FREE, THYROIDAB in the last 72 hours. Anemia Panel: No results for input(s): VITAMINB12, FOLATE, FERRITIN, TIBC, IRON, RETICCTPCT in the  last 72 hours. Sepsis Labs: No results for input(s): PROCALCITON, LATICACIDVEN in the last 168 hours.  Recent Results (from the past 240 hour(s))  Culture, Urine     Status: Abnormal   Collection Time: 04/17/19  2:09 PM  Result Value Ref Range Status   Specimen Description   Final    URINE, CLEAN CATCH Performed at [facility], [address] [place], [place] [address]    Special Requests   Final    NONE Performed at [facility], [address], [place], [place] [address]    Culture MULTIPLE SPECIES PRESENT, SUGGEST RECOLLECTION (A)  Final   Report Status 04/19/2019 FINAL  Final  Culture, blood (routine x 2)     Status: Abnormal   Collection Time: 04/17/19  2:48 PM  Result Value Ref Range Status   Specimen Description   Final    RIGHT ANTECUBITAL Performed at [facility], [address], [place], [place] [address]    Special Requests   Final    BOTTLES DRAWN AEROBIC AND ANAEROBIC Blood Culture adequate volume Performed at [facility], [address], [place], [place] [address]    Culture  Setup Time   Final    GRAM NEGATIVE RODS IN BOTH AEROBIC AND ANAEROBIC BOTTLES CRITICAL RESULT CALLED TO, READ BACK BY AND VERIFIED WITH: M. RENZ PHARMD, AT 6578 04/18/19 BY D. VANHOOK    Culture (A)  Final    PROTEUS MIRABILIS SUSCEPTIBILITIES PERFORMED ON PREVIOUS CULTURE WITHIN THE LAST 5 DAYS. Performed at [facility], [address] [place], [place] [address]    Report Status 04/20/2019 FINAL  Final  Blood Culture ID Panel (Reflexed)     Status: Abnormal   Collection Time: 04/17/19  2:53 PM  Result Value Ref Range Status   Enterococcus species NOT DETECTED NOT DETECTED Final   Listeria monocytogenes NOT DETECTED NOT DETECTED Final   Staphylococcus species NOT DETECTED NOT DETECTED Final   Staphylococcus aureus (BCID) NOT DETECTED NOT DETECTED Final   Streptococcus species NOT DETECTED NOT  DETECTED Final   Streptococcus agalactiae NOT DETECTED NOT DETECTED Final   Streptococcus pneumoniae NOT DETECTED NOT DETECTED Final   Streptococcus pyogenes NOT DETECTED NOT DETECTED Final   Acinetobacter baumannii NOT DETECTED NOT DETECTED Final   Enterobacteriaceae species DETECTED (A) NOT DETECTED Final    Comment: Enterobacteriaceae represent a large family of gram-negative bacteria, not a single organism. CRITICAL RESULT CALLED TO, READ BACK BY AND VERIFIED WITH: Becky Sax PHARMD, AT 1040 04/18/19 BY D. VANHOOK    Enterobacter  cloacae complex NOT DETECTED NOT DETECTED Final   Escherichia coli NOT DETECTED NOT DETECTED Final   Klebsiella oxytoca NOT DETECTED NOT DETECTED Final   Klebsiella pneumoniae NOT DETECTED NOT DETECTED Final   Proteus species DETECTED (A) NOT DETECTED Final    Comment: CRITICAL RESULT CALLED TO, READ BACK BY AND VERIFIED WITH: Becky SaxM. SWAINE PHARMD, AT 1040 04/18/19 BY D. VANHOOK    Serratia marcescens NOT DETECTED NOT DETECTED Final   Carbapenem resistance NOT DETECTED NOT DETECTED Final   Haemophilus influenzae NOT DETECTED NOT DETECTED Final   Neisseria meningitidis NOT DETECTED NOT DETECTED Final   Pseudomonas aeruginosa NOT DETECTED NOT DETECTED Final   Candida albicans NOT DETECTED NOT DETECTED Final   Candida glabrata NOT DETECTED NOT DETECTED Final   Candida krusei NOT DETECTED NOT DETECTED Final   Candida parapsilosis NOT DETECTED NOT DETECTED Final   Candida tropicalis NOT DETECTED NOT DETECTED Final    Comment: Performed at [facility], [address] [place], [place] [address]  Culture, blood (routine x 2)     Status: Abnormal   Collection Time: 04/17/19  2:54 PM  Result Value Ref Range Status   Specimen Description   Final    BLOOD RIGHT HAND Performed at [facility], [address] [place], [place] [address]    Special Requests   Final    BOTTLES DRAWN AEROBIC AND ANAEROBIC Blood Culture adequate volume Performed at  [facility], [address] [place], [place] [address]    Culture  Setup Time   Final    GRAM NEGATIVE RODS IN BOTH AEROBIC AND ANAEROBIC BOTTLES CRITICAL VALUE NOTED.  VALUE IS CONSISTENT WITH PREVIOUSLY REPORTED AND CALLED VALUE.    Culture PROTEUS MIRABILIS (A)  Final   Report Status 04/20/2019 FINAL  Final   Organism ID, Bacteria PROTEUS MIRABILIS  Final      Susceptibility   Proteus mirabilis - MIC*    AMPICILLIN <=2 SENSITIVE Sensitive     CEFAZOLIN <=4 SENSITIVE Sensitive     CEFEPIME <=1 SENSITIVE Sensitive     CEFTAZIDIME <=1 SENSITIVE Sensitive     CEFTRIAXONE <=1 SENSITIVE Sensitive     CIPROFLOXACIN <=0.25 SENSITIVE Sensitive     GENTAMICIN <=1 SENSITIVE Sensitive     IMIPENEM 2 SENSITIVE Sensitive     TRIMETH/SULFA <=20 SENSITIVE Sensitive     AMPICILLIN/SULBACTAM <=2 SENSITIVE Sensitive     PIP/TAZO <=4 SENSITIVE Sensitive     * PROTEUS MIRABILIS  Culture, blood (single)     Status: Abnormal   Collection Time: 04/19/19  7:18 PM  Result Value Ref Range Status   Specimen Description   Final    BLOOD LEFT HAND Performed at [facility], [address] [place], [place] [address]    Special Requests   Final    BOTTLES DRAWN AEROBIC AND ANAEROBIC Blood Culture adequate volume Performed at [facility], [address] [place], [place] [address]    Culture  Setup Time   Final    GRAM POSITIVE COCCI IN CHAINS IN BOTH AEROBIC AND ANAEROBIC BOTTLES CRITICAL RESULT CALLED TO, READ BACK BY AND VERIFIED WITH: J. GRIMSLEY,PHARMD [id number] 04/23/2019 T. TYSOR    Culture (A)  Final    VIRIDANS STREPTOCOCCUS THE SIGNIFICANCE OF ISOLATING THIS ORGANISM FROM A SINGLE SET OF BLOOD CULTURES WHEN MULTIPLE SETS ARE DRAWN IS UNCERTAIN. PLEASE NOTIFY THE MICROBIOLOGY DEPARTMENT WITHIN ONE WEEK IF SPECIATION AND SENSITIVITIES ARE REQUIRED. Performed at [facility], [address], [place], [place] [address]    Report  Status 04/25/2019 FINAL  Final  Blood Culture ID Panel (Reflexed)     Status: Abnormal   Collection Time: 04/19/19  7:18 PM  Result Value Ref Range Status   Enterococcus species NOT DETECTED NOT DETECTED Final   Listeria monocytogenes NOT DETECTED NOT DETECTED Final   Staphylococcus species NOT DETECTED NOT DETECTED Final   Staphylococcus aureus (BCID) NOT DETECTED NOT DETECTED Final   Streptococcus species DETECTED (A) NOT DETECTED Final    Comment: Not Enterococcus species, Streptococcus agalactiae, Streptococcus pyogenes, or Streptococcus pneumoniae. CRITICAL RESULT CALLED TO, READ BACK BY AND VERIFIED WITH: Trixie Deis [id number] 04/23/2019 T. TYSOR    Streptococcus agalactiae NOT DETECTED NOT DETECTED Final   Streptococcus pneumoniae NOT DETECTED NOT DETECTED Final   Streptococcus pyogenes NOT DETECTED NOT DETECTED Final   Acinetobacter baumannii NOT DETECTED NOT DETECTED Final   Enterobacteriaceae species NOT DETECTED NOT DETECTED Final   Enterobacter cloacae complex NOT DETECTED NOT DETECTED Final   Escherichia coli NOT DETECTED NOT DETECTED Final   Klebsiella oxytoca NOT DETECTED NOT DETECTED Final   Klebsiella pneumoniae NOT DETECTED NOT DETECTED Final   Proteus species NOT DETECTED NOT DETECTED Final   Serratia marcescens NOT DETECTED NOT DETECTED Final   Haemophilus influenzae NOT DETECTED NOT DETECTED Final   Neisseria meningitidis NOT DETECTED NOT DETECTED Final   Pseudomonas aeruginosa NOT DETECTED NOT DETECTED Final   Candida albicans NOT DETECTED NOT DETECTED Final   Candida glabrata NOT DETECTED NOT DETECTED Final   Candida krusei NOT DETECTED NOT DETECTED Final   Candida parapsilosis NOT DETECTED NOT DETECTED Final   Candida tropicalis NOT DETECTED NOT DETECTED Final    Comment: Performed at [facility], [address], [place], [place] [address]         Radiology Studies: No results found.      Scheduled Meds: . mouth rinse  15 mL Mouth Rinse  BID  . nicotine  21 mg Transdermal Daily   Continuous Infusions: . fentaNYL infusion INTRAVENOUS 50 mcg/hr (04/25/19 0406)     LOS: 21 days    Time spent: 35 minutes    Ramiro Harvest, MD [facility]  If 7PM-7AM, please contact night-coverage [email or web address] Apr 30, 2019, 12:54 PM

## 2019-05-15 NOTE — Progress Notes (Addendum)
Daily Progress Note   Patient Name: April [name]       Date: 04/18/2019 DOB: 1946-04-25  Age: 73 y.o. MRN#: [id number] Attending Physician: Rodolph Bong, MD Primary Care Physician: Richmond Campbell., PA-C Admit Date: 2019/04/11  Reason for Consultation/Follow-up: Establishing goals of care   Subjective: Patient actively dying. Unresponsive to sternal rub. Appears uncomfortable with tachypnea and accessory muscle usage. RR 38. Hot to touch. Vitals checked. 103.3 axillary temp. HR 129.  Daughter and son-in-law at bedside. Discussed symptom management medications and EOL expectations. They are prepared for 'anything to happen at any time' with goal to ensure she is comfortable at EOL. Answered questions. Emotional/spiritual support provided. Family requesting she receive a bath today. PMT contact information given.   Increased fentanyl gtt to 69mcg/hr. Instructed RN to give prn dilaudid and prn tylenol suppository. Requested bath this AM.   ADDENDUM 1100: Patient much more comfortable after prn fentanyl bolus, prn dilaudid, increase in basal rate, and tylenol suppository. Appreciate RN and NT assist with bathing. Emotional support provided to family at bedside.  Length of Stay: 21  Current Medications: Scheduled Meds:  . mouth rinse  15 mL Mouth Rinse BID  . nicotine  21 mg Transdermal Daily    Continuous Infusions: . fentaNYL infusion INTRAVENOUS 50 mcg/hr (04/25/19 0406)    PRN Meds: acetaminophen, antiseptic oral rinse, fentaNYL, fentaNYL (SUBLIMAZE) injection, glycopyrrolate **OR** glycopyrrolate **OR** glycopyrrolate, HYDROmorphone (DILAUDID) injection, LORazepam  Physical Exam Vitals signs and nursing note reviewed.  Constitutional:      Appearance: She is cachectic.  She is ill-appearing.  HENT:     Head: Normocephalic and atraumatic.  Cardiovascular:     Rate and Rhythm: Tachycardia present.  Pulmonary:     Effort: Tachypnea and accessory muscle usage present.     Comments: Fentanyl basal rate increased. Instructed RN to give prn dilaudid dose. Abdominal:     Tenderness: There is no abdominal tenderness.  Skin:    Comments: Hot to touch  Neurological:     Mental Status: She is unresponsive.            Vital Signs: BP (!) 109/57 (BP Location: Right Arm)   Pulse (!) 129   Temp 98.5 F (36.9 C) (Oral)   Resp (!) 22   Ht 5\' 6"  (1.676 m)  Wt 48.6 kg   SpO2 (!) 84%   BMI 17.29 kg/m  SpO2: SpO2: (!) 84 % O2 Device: O2 Device: Nasal Cannula O2 Flow Rate: O2 Flow Rate (L/min): 2 L/min  Intake/output summary:   Intake/Output Summary (Last 24 hours) at 04/16/2019 [id number] Last data filed at 05/04/2019 [id number] Gross per 24 hour  Intake 144.5 ml  Output -  Net 144.5 ml   LBM: Last BM Date: 04/21/19 Baseline Weight: Weight: 49.9 kg Most recent weight: Weight: 48.6 kg       Palliative Assessment/Data: PPS 10%   Flowsheet Rows     Most Recent Value  Intake Tab  Referral Department  Hospitalist  Unit at Time of Referral  Med/Surg Unit  Palliative Care Primary Diagnosis  Sepsis/Infectious Disease  Date Notified  04/17/19  Palliative Care Type  Return patient Palliative Care  Reason for referral  Clarify Goals of Care, Non-pain Symptom  Date of Admission  05-07-2019  Date first seen by Palliative Care  04/18/19  # of days Palliative referral response time  1 Day(s)  # of days IP prior to Palliative referral  12  Clinical Assessment  Palliative Performance Scale Score  10%  Psychosocial & Spiritual Assessment  Palliative Care Outcomes  Patient/Family meeting held?  Yes  Who was at the meeting?  daughter, son-in-law  Palliative Care Outcomes  Clarified goals of care, Provided end of life care assistance, Provided psychosocial or spiritual  support, Improved pain interventions, Improved non-pain symptom therapy, Changed to focus on comfort      Patient Active Problem List   Diagnosis Date Noted  . Pressure injury of skin 04/23/2019  . Acute blood loss anemia 04/22/2019  . Shock circulatory (HCC) 04/22/2019  . Pleural effusion, bilateral  L >R  04/22/2019  . Palliative care by specialist   . Terminal care   . Stress-induced cardiomyopathy 04/09/2019  . Respiratory failure (HCC) hypoxic and hypercapnic/ acute 04/09/2019  . Sepsis (HCC) 05-24-202020  . Acute pancreatitis 05-24-202020  . UTI (urinary tract infection) 05-24-202020  . Pseudocyst of pancreas 05-24-202020  . Portal vein thrombosis 05-24-202020  . Hip fracture (HCC) 03/21/2019  . Displaced intertrochanteric fracture of left femur, initial encounter for closed fracture (HCC)   . Anaphylactic reaction 07/24/2018  . FTT (failure to thrive) in adult 05/18/2018  . Falls 05/18/2018  . Pancreatitis 10/05/2017  . Frequent falls 02/11/2017  . Protein-calorie malnutrition, severe 02/05/2017  . Gallstone pancreatitis 01/30/2017  . Hypokalemia 01/30/2017  . Macrocytic anemia 01/30/2017  . Metabolic acidosis 01/30/2017  . Neuropathy, alcoholic (HCC) 09/08/2016  . Neuropathy 12/11/2015  . Alcohol abuse 12/11/2015  . Tobacco abuse 12/11/2015  . Chronic venous insufficiency 09/13/2015  . Extremity numbness 09/13/2015    Palliative Care Assessment & Plan   Patient Profile: 73 y.o. female  with past medical history of anxiety, depression, ETOH and tobacco use, GERD, HTN, HLD, pancreatitis and recent left intertrochanteric femur fracture s/p intramedullary nailing on 03/22/19  admitted on 2019-11-22 with hip pain and UTI symptoms. Hospital admission for sepsis secondary to UTI and acute pancreatitis. Hospital course complicated by stress-induced cardiomyopathy with EF 35-40%, acute cardiogenic pulmonary edema, acute respiratory failure, and recurrent fevers. Blood cultures 4/22 showed  proteus mirabilis and negative on 4/26. Repeat blood cultures 5/4 positive for GNR. Patient also with portal vein thrombosis. GI consulted and recommending soft diet with 3-6 months anticoagulation. Receiving Eliquis. Severe protein calorie malnutrition likely due to alcoholism; daughter reports 1-3 shot glasses per day of brandy. Palliative  medicine consultation for goals of care.   Assessment: Sepsis UTI Proteus mirabilis and GNR bacteremia Acute systolic heart failure/stress-induced cardiomyopathy Acute cardiogenic pulmonary edema Acute hypoxic and hypercapnic respiratory failure Acute pancreatitis complicated with pseudocyst and portal vein thrombosis Hypovolemic shock Left gastric artery bleed secondary to erosion of pseudocyst ETOH abuse  Acute metabolic encephalopathy Severe protein calorie malnutrition Deconditioning   Recommendations/Plan:  DNR/DNI, comfort measures only.  Continue fentanyl infusion and prn comfort meds to ensure comfort.   Frequent oral care.  Lift visitor restriction to allow family to visit patient at EOL.  Appreciate chaplain support.  Actively dying. Anticipate hospital death. Recommend leaving inpatient due to high risk for death during transfer to a hospice facility.  Code Status: DNR   Code Status Orders  (From admission, onward)         Start     Ordered   04/02/2019 2031  Full code  Continuous     04/01/2019 2031        Code Status History    Date Active Date Inactive Code Status Order ID Comments User Context   03/21/2019 2041 03/24/2019 1625 Full Code [id number]  Meredeth Ide, MD Inpatient   07/24/2018 2043 07/26/2018 1546 Full Code [id number]  Tonye Royalty, DO Inpatient   10/05/2017 0158 10/08/2017 1916 Full Code [id number]  Clydie Braun, MD ED   04/09/2017 2020 04/13/2017 1904 Full Code [id number]  Michael Litter, MD ED   02/11/2017 0851 02/12/2017 1705 Full Code [id number]  Haydee Salter, MD ED   01/30/2017 0305 02/06/2017 2122 Full  Code [id number]  Danford, Earl Lites, MD Inpatient       Prognosis:   Hours-days  Discharge Planning:  Anticipated Hospital Death   Care plan was discussed with daughter, son-in-law, RN, Dr. Janee Morn  Thank you for allowing the Palliative Medicine Team to assist in the care of this patient.   Time In: 0840- 1100- Time Out: 0805 1115 Total Time 40 Prolonged Time Billed no      Greater than 50%  of this time was spent counseling and coordinating care related to the above assessment and plan.  Vennie Homans, DNP, FNP-C Palliative Medicine Team  Phone: [phone] Fax: [phone]  Please contact Palliative Medicine Team phone at [phone] for questions and concerns.

## 2019-05-15 DEATH — deceased
